# Patient Record
Sex: Male | Born: 1950
Health system: Southern US, Community
[De-identification: ages and names within clinical notes are randomized; demographics above are authoritative.]

## PROBLEM LIST (undated history)

## (undated) DIAGNOSIS — Z9861 Coronary angioplasty status: Principal | ICD-10-CM

## (undated) DIAGNOSIS — I2510 Atherosclerotic heart disease of native coronary artery without angina pectoris: Secondary | ICD-10-CM

## (undated) DIAGNOSIS — E119 Type 2 diabetes mellitus without complications: Secondary | ICD-10-CM

## (undated) DIAGNOSIS — M1990 Unspecified osteoarthritis, unspecified site: Secondary | ICD-10-CM

## (undated) DIAGNOSIS — S14129A Central cord syndrome at unspecified level of cervical spinal cord, initial encounter: Secondary | ICD-10-CM

## (undated) DIAGNOSIS — E7800 Pure hypercholesterolemia, unspecified: Secondary | ICD-10-CM

## (undated) DIAGNOSIS — I5042 Chronic combined systolic (congestive) and diastolic (congestive) heart failure: Secondary | ICD-10-CM

## (undated) DIAGNOSIS — I2699 Other pulmonary embolism without acute cor pulmonale: Secondary | ICD-10-CM

## (undated) DIAGNOSIS — E669 Obesity, unspecified: Secondary | ICD-10-CM

## (undated) DIAGNOSIS — I10 Essential (primary) hypertension: Secondary | ICD-10-CM

## (undated) DIAGNOSIS — I214 Non-ST elevation (NSTEMI) myocardial infarction: Secondary | ICD-10-CM

## (undated) HISTORY — DX: Obesity, unspecified: E66.9

## (undated) HISTORY — DX: Coronary angioplasty status: Z98.61

## (undated) HISTORY — DX: Other pulmonary embolism without acute cor pulmonale: I26.99

## (undated) HISTORY — DX: Atherosclerotic heart disease of native coronary artery without angina pectoris: I25.10

## (undated) HISTORY — DX: Unspecified osteoarthritis, unspecified site: M19.90

## (undated) HISTORY — DX: Essential (primary) hypertension: I10

## (undated) HISTORY — DX: Chronic combined systolic (congestive) and diastolic (congestive) heart failure: I50.42

## (undated) HISTORY — PX: CYSTOSCOPY/RETROGRADE/URETEROSCOPY/STONE EXTRACTION WITH BASKET: SHX5317

## (undated) HISTORY — PX: MENISCUS REPAIR: SHX5179

## (undated) NOTE — *Deleted (*Deleted)
Health Maintenance Due  Topic Date Due  . COVID-19 Vaccine (1) Never done  . INFLUENZA VACCINE  03/25/2020  Please stop by lab before you go If you have mychart- we will send your results within 3 business days of [place] receiving them.  If you do not have mychart- we will call you about results within 5 business days of [place] receiving them.  *please note we are currently using [facility] which has a longer processing time than [place] typically so labs may not come back as quickly as in the past *please also note that you will see labs on mychart as soon as they post. I will later go in and write notes on them- will say "notes from Dr. Durene Cal"  Depression screen [facility] 2/9 01/06/2020 12/22/2019 01/13/2019  Decreased Interest 0 0 0  Down, Depressed, Hopeless 0 0 0  PHQ - 2 Score 0 0 0  Some recent data might be hidden

---

## 1998-10-17 ENCOUNTER — Encounter: Payer: Self-pay | Admitting: Urology

## 1998-10-17 ENCOUNTER — Ambulatory Visit (HOSPITAL_COMMUNITY): Admission: RE | Admit: 1998-10-17 | Discharge: 1998-10-17 | Payer: Self-pay | Admitting: Urology

## 1998-12-10 ENCOUNTER — Ambulatory Visit (HOSPITAL_COMMUNITY): Admission: RE | Admit: 1998-12-10 | Discharge: 1998-12-10 | Payer: Self-pay | Admitting: Urology

## 1998-12-10 ENCOUNTER — Encounter: Payer: Self-pay | Admitting: Urology

## 2000-04-06 ENCOUNTER — Ambulatory Visit (HOSPITAL_COMMUNITY): Admission: RE | Admit: 2000-04-06 | Discharge: 2000-04-06 | Payer: Self-pay | Admitting: Internal Medicine

## 2000-04-07 ENCOUNTER — Encounter: Payer: Self-pay | Admitting: Internal Medicine

## 2002-08-25 HISTORY — PX: TRANSTHORACIC ECHOCARDIOGRAM: SHX275

## 2003-06-22 ENCOUNTER — Emergency Department (HOSPITAL_COMMUNITY): Admission: EM | Admit: 2003-06-22 | Discharge: 2003-06-22 | Payer: Self-pay

## 2003-06-27 ENCOUNTER — Encounter (HOSPITAL_COMMUNITY): Admission: RE | Admit: 2003-06-27 | Discharge: 2003-09-25 | Payer: Self-pay | Admitting: *Deleted

## 2004-10-17 ENCOUNTER — Ambulatory Visit: Payer: Self-pay | Admitting: Internal Medicine

## 2004-11-29 ENCOUNTER — Ambulatory Visit: Payer: Self-pay | Admitting: Internal Medicine

## 2005-03-03 ENCOUNTER — Ambulatory Visit: Payer: Self-pay | Admitting: Internal Medicine

## 2005-03-18 ENCOUNTER — Ambulatory Visit: Payer: Self-pay | Admitting: Internal Medicine

## 2005-03-28 ENCOUNTER — Ambulatory Visit: Payer: Self-pay | Admitting: Internal Medicine

## 2005-04-21 ENCOUNTER — Ambulatory Visit: Payer: Self-pay | Admitting: Internal Medicine

## 2005-06-23 ENCOUNTER — Ambulatory Visit: Payer: Self-pay | Admitting: Internal Medicine

## 2005-07-25 ENCOUNTER — Ambulatory Visit: Payer: Self-pay | Admitting: Family Medicine

## 2005-09-30 ENCOUNTER — Ambulatory Visit: Payer: Self-pay | Admitting: Internal Medicine

## 2006-01-06 ENCOUNTER — Ambulatory Visit: Payer: Self-pay | Admitting: Internal Medicine

## 2006-03-16 ENCOUNTER — Ambulatory Visit: Payer: Self-pay | Admitting: Internal Medicine

## 2006-03-19 ENCOUNTER — Ambulatory Visit: Payer: Self-pay | Admitting: Internal Medicine

## 2006-05-19 ENCOUNTER — Ambulatory Visit: Payer: Self-pay | Admitting: Internal Medicine

## 2006-05-23 ENCOUNTER — Ambulatory Visit: Payer: Self-pay | Admitting: Family Medicine

## 2006-05-27 ENCOUNTER — Ambulatory Visit (HOSPITAL_COMMUNITY): Admission: RE | Admit: 2006-05-27 | Discharge: 2006-05-27 | Payer: Self-pay | Admitting: Urology

## 2006-06-25 ENCOUNTER — Ambulatory Visit: Payer: Self-pay | Admitting: Internal Medicine

## 2006-07-23 ENCOUNTER — Ambulatory Visit (HOSPITAL_BASED_OUTPATIENT_CLINIC_OR_DEPARTMENT_OTHER): Admission: RE | Admit: 2006-07-23 | Discharge: 2006-07-23 | Payer: Self-pay | Admitting: Urology

## 2006-08-27 ENCOUNTER — Ambulatory Visit: Payer: Self-pay | Admitting: Internal Medicine

## 2006-12-01 ENCOUNTER — Ambulatory Visit: Payer: Self-pay | Admitting: Internal Medicine

## 2007-02-09 ENCOUNTER — Ambulatory Visit: Payer: Self-pay | Admitting: Internal Medicine

## 2007-02-19 ENCOUNTER — Encounter: Payer: Self-pay | Admitting: Internal Medicine

## 2007-03-15 DIAGNOSIS — I10 Essential (primary) hypertension: Secondary | ICD-10-CM | POA: Insufficient documentation

## 2007-03-15 DIAGNOSIS — E1159 Type 2 diabetes mellitus with other circulatory complications: Secondary | ICD-10-CM | POA: Insufficient documentation

## 2007-03-15 DIAGNOSIS — I152 Hypertension secondary to endocrine disorders: Secondary | ICD-10-CM | POA: Insufficient documentation

## 2007-03-15 DIAGNOSIS — E669 Obesity, unspecified: Secondary | ICD-10-CM | POA: Insufficient documentation

## 2007-03-15 DIAGNOSIS — M109 Gout, unspecified: Secondary | ICD-10-CM | POA: Insufficient documentation

## 2007-03-22 DIAGNOSIS — M1990 Unspecified osteoarthritis, unspecified site: Secondary | ICD-10-CM | POA: Insufficient documentation

## 2007-03-23 ENCOUNTER — Ambulatory Visit: Payer: Self-pay | Admitting: Internal Medicine

## 2007-03-23 LAB — CONVERTED CEMR LAB
ALT: 25 U/L
AST: 22 U/L
Albumin: 3.3 g/dL — ABNORMAL LOW
Alkaline Phosphatase: 70 U/L
BUN: 25 mg/dL — ABNORMAL HIGH
Basophils Absolute: 0 10*3/uL
Basophils Relative: 0.2 %
Bilirubin Urine: NEGATIVE
Bilirubin, Direct: 0.1 mg/dL
CO2: 27 meq/L
Calcium: 9 mg/dL
Chloride: 106 meq/L
Cholesterol: 138 mg/dL
Creatinine, Ser: 1.2 mg/dL
Eosinophils Absolute: 0.1 10*3/uL
Eosinophils Relative: 1.5 %
GFR calc Af Amer: 80 mL/min
GFR calc non Af Amer: 66 mL/min
Glucose, Bld: 97 mg/dL
Glucose, Urine, Semiquant: NEGATIVE
HCT: 40 %
HDL: 33.5 mg/dL — ABNORMAL LOW
Hemoglobin: 13.9 g/dL
Ketones, urine, test strip: NEGATIVE
LDL Cholesterol: 91 mg/dL
Lymphocytes Relative: 32 %
MCHC: 34.7 g/dL
MCV: 87.6 fL
Monocytes Absolute: 0.7 10*3/uL
Monocytes Relative: 9.5 %
Neutro Abs: 4.4 10*3/uL
Neutrophils Relative %: 56.8 %
Nitrite: NEGATIVE
PSA: 0.44 ng/mL
Platelets: 224 10*3/uL
Potassium: 4.1 meq/L
Protein, U semiquant: NEGATIVE
RBC: 4.57 M/uL
RDW: 13.7 %
Sodium: 141 meq/L
Specific Gravity, Urine: 1.025
TSH: 1.86 u[IU]/mL
Total Bilirubin: 0.9 mg/dL
Total CHOL/HDL Ratio: 4.1
Total Protein: 6.8 g/dL
Triglycerides: 67 mg/dL
Urobilinogen, UA: 0.2
VLDL: 13 mg/dL
WBC Urine, dipstick: NEGATIVE
WBC: 7.7 10*3/uL
pH: 5.5

## 2007-03-25 ENCOUNTER — Ambulatory Visit: Payer: Self-pay | Admitting: Internal Medicine

## 2007-05-31 ENCOUNTER — Ambulatory Visit: Payer: Self-pay | Admitting: Internal Medicine

## 2007-05-31 DIAGNOSIS — E1142 Type 2 diabetes mellitus with diabetic polyneuropathy: Secondary | ICD-10-CM | POA: Insufficient documentation

## 2007-05-31 DIAGNOSIS — R109 Unspecified abdominal pain: Secondary | ICD-10-CM | POA: Insufficient documentation

## 2007-05-31 DIAGNOSIS — G608 Other hereditary and idiopathic neuropathies: Secondary | ICD-10-CM | POA: Insufficient documentation

## 2007-05-31 LAB — CONVERTED CEMR LAB
Folate: 10.6 ng/mL
Hgb A1c MFr Bld: 6.1 % — ABNORMAL HIGH (ref 4.6–6.0)
Vitamin B-12: 339 pg/mL (ref 211–911)

## 2007-06-24 ENCOUNTER — Encounter: Payer: Self-pay | Admitting: Internal Medicine

## 2007-07-15 ENCOUNTER — Ambulatory Visit: Payer: Self-pay | Admitting: Internal Medicine

## 2007-11-10 ENCOUNTER — Ambulatory Visit: Payer: Self-pay | Admitting: Internal Medicine

## 2007-11-10 DIAGNOSIS — M549 Dorsalgia, unspecified: Secondary | ICD-10-CM | POA: Insufficient documentation

## 2007-11-10 DIAGNOSIS — M545 Low back pain, unspecified: Secondary | ICD-10-CM | POA: Insufficient documentation

## 2008-11-30 ENCOUNTER — Encounter: Payer: Self-pay | Admitting: Internal Medicine

## 2008-11-30 ENCOUNTER — Ambulatory Visit: Payer: Self-pay

## 2008-11-30 ENCOUNTER — Telehealth: Payer: Self-pay | Admitting: Internal Medicine

## 2008-11-30 DIAGNOSIS — M79609 Pain in unspecified limb: Secondary | ICD-10-CM | POA: Insufficient documentation

## 2008-12-01 ENCOUNTER — Ambulatory Visit: Payer: Self-pay | Admitting: Internal Medicine

## 2008-12-01 LAB — CONVERTED CEMR LAB
ALT: 27 units/L (ref 0–53)
AST: 25 units/L (ref 0–37)
Albumin: 3.3 g/dL — ABNORMAL LOW (ref 3.5–5.2)
Alkaline Phosphatase: 82 units/L (ref 39–117)
BUN: 20 mg/dL (ref 6–23)
Basophils Absolute: 0 10*3/uL (ref 0.0–0.1)
Basophils Relative: 0.4 % (ref 0.0–3.0)
Bilirubin Urine: NEGATIVE
Bilirubin, Direct: 0.1 mg/dL (ref 0.0–0.3)
CO2: 30 meq/L (ref 19–32)
Calcium: 8.7 mg/dL (ref 8.4–10.5)
Chloride: 108 meq/L (ref 96–112)
Cholesterol: 136 mg/dL (ref 0–200)
Creatinine, Ser: 1.1 mg/dL (ref 0.4–1.5)
Eosinophils Absolute: 0.1 10*3/uL (ref 0.0–0.7)
Eosinophils Relative: 1.5 % (ref 0.0–5.0)
GFR calc non Af Amer: 87.98 mL/min (ref >60)
Glucose, Bld: 117 mg/dL — ABNORMAL HIGH (ref 70–99)
HCT: 42.9 % (ref 39.0–52.0)
HDL: 38.4 mg/dL — ABNORMAL LOW (ref >39.00)
Hemoglobin, Urine: NEGATIVE
Hemoglobin: 14.7 g/dL (ref 13.0–17.0)
Ketones, ur: NEGATIVE mg/dL
LDL Cholesterol: 88 mg/dL (ref 0–99)
Leukocytes, UA: NEGATIVE
Lymphocytes Relative: 25 % (ref 12.0–46.0)
Lymphs Abs: 1.9 10*3/uL (ref 0.7–4.0)
MCHC: 34.2 g/dL (ref 30.0–36.0)
MCV: 88.4 fL (ref 78.0–100.0)
Monocytes Absolute: 0.6 10*3/uL (ref 0.1–1.0)
Monocytes Relative: 8.2 % (ref 3.0–12.0)
Neutro Abs: 5 10*3/uL (ref 1.4–7.7)
Neutrophils Relative %: 64.9 % (ref 43.0–77.0)
Nitrite: NEGATIVE
PSA: 0.46 ng/mL (ref 0.10–4.00)
Platelets: 204 10*3/uL (ref 150.0–400.0)
Potassium: 4.4 meq/L (ref 3.5–5.1)
RBC: 4.85 M/uL (ref 4.22–5.81)
RDW: 13.3 % (ref 11.5–14.6)
Sodium: 144 meq/L (ref 135–145)
Specific Gravity, Urine: 1.025 (ref 1.000–1.030)
TSH: 0.98 microintl units/mL (ref 0.35–5.50)
Total Bilirubin: 0.7 mg/dL (ref 0.3–1.2)
Total CHOL/HDL Ratio: 4
Total Protein, Urine: NEGATIVE mg/dL
Total Protein: 7.1 g/dL (ref 6.0–8.3)
Triglycerides: 47 mg/dL (ref 0.0–149.0)
Urine Glucose: NEGATIVE mg/dL
Urobilinogen, UA: 0.2 (ref 0.0–1.0)
VLDL: 9.4 mg/dL (ref 0.0–40.0)
WBC: 7.6 10*3/uL (ref 4.5–10.5)
pH: 5.5 (ref 5.0–8.0)

## 2008-12-08 ENCOUNTER — Ambulatory Visit: Payer: Self-pay | Admitting: Internal Medicine

## 2008-12-08 DIAGNOSIS — R51 Headache: Secondary | ICD-10-CM | POA: Insufficient documentation

## 2008-12-08 DIAGNOSIS — R600 Localized edema: Secondary | ICD-10-CM | POA: Insufficient documentation

## 2008-12-08 DIAGNOSIS — R519 Headache, unspecified: Secondary | ICD-10-CM | POA: Insufficient documentation

## 2008-12-08 DIAGNOSIS — R609 Edema, unspecified: Secondary | ICD-10-CM | POA: Insufficient documentation

## 2009-08-21 ENCOUNTER — Ambulatory Visit: Payer: Self-pay | Admitting: Internal Medicine

## 2009-08-21 DIAGNOSIS — M719 Bursopathy, unspecified: Secondary | ICD-10-CM

## 2009-08-21 DIAGNOSIS — M67919 Unspecified disorder of synovium and tendon, unspecified shoulder: Secondary | ICD-10-CM | POA: Insufficient documentation

## 2009-08-22 ENCOUNTER — Encounter: Payer: Self-pay | Admitting: Internal Medicine

## 2010-06-28 ENCOUNTER — Encounter: Payer: Self-pay | Admitting: Internal Medicine

## 2010-06-28 ENCOUNTER — Telehealth: Payer: Self-pay | Admitting: Internal Medicine

## 2010-07-09 ENCOUNTER — Telehealth: Payer: Self-pay | Admitting: Internal Medicine

## 2010-07-10 ENCOUNTER — Ambulatory Visit: Payer: Self-pay | Admitting: Internal Medicine

## 2010-07-10 LAB — CONVERTED CEMR LAB
ALT: 21 units/L (ref 0–53)
AST: 23 units/L (ref 0–37)
Albumin: 3.4 g/dL — ABNORMAL LOW (ref 3.5–5.2)
Alkaline Phosphatase: 83 units/L (ref 39–117)
BUN: 24 mg/dL — ABNORMAL HIGH (ref 6–23)
Basophils Absolute: 0 10*3/uL (ref 0.0–0.1)
Basophils Relative: 0.5 % (ref 0.0–3.0)
Bilirubin Urine: NEGATIVE
Bilirubin, Direct: 0 mg/dL (ref 0.0–0.3)
CO2: 27 meq/L (ref 19–32)
Calcium: 8.9 mg/dL (ref 8.4–10.5)
Chloride: 106 meq/L (ref 96–112)
Cholesterol: 164 mg/dL (ref 0–200)
Creatinine, Ser: 1.3 mg/dL (ref 0.4–1.5)
Eosinophils Absolute: 0.1 10*3/uL (ref 0.0–0.7)
Eosinophils Relative: 1.5 % (ref 0.0–5.0)
GFR calc non Af Amer: 73.47 mL/min (ref >60)
Glucose, Bld: 120 mg/dL — ABNORMAL HIGH (ref 70–99)
Glucose, Urine, Semiquant: NEGATIVE
HCT: 43.6 % (ref 39.0–52.0)
HDL: 39.7 mg/dL (ref >39.00)
Hemoglobin: 14.7 g/dL (ref 13.0–17.0)
Ketones, urine, test strip: NEGATIVE
LDL Cholesterol: 116 mg/dL — ABNORMAL HIGH (ref 0–99)
Lymphocytes Relative: 29.4 % (ref 12.0–46.0)
Lymphs Abs: 2.3 10*3/uL (ref 0.7–4.0)
MCHC: 33.7 g/dL (ref 30.0–36.0)
MCV: 90.8 fL (ref 78.0–100.0)
Monocytes Absolute: 0.7 10*3/uL (ref 0.1–1.0)
Monocytes Relative: 8.8 % (ref 3.0–12.0)
Neutro Abs: 4.7 10*3/uL (ref 1.4–7.7)
Neutrophils Relative %: 59.8 % (ref 43.0–77.0)
Nitrite: NEGATIVE
PSA: 0.47 ng/mL (ref 0.10–4.00)
Platelets: 223 10*3/uL (ref 150.0–400.0)
Potassium: 4.3 meq/L (ref 3.5–5.1)
Protein, U semiquant: NEGATIVE
RBC: 4.8 M/uL (ref 4.22–5.81)
RDW: 13.9 % (ref 11.5–14.6)
Sodium: 141 meq/L (ref 135–145)
Specific Gravity, Urine: 1.025
TSH: 1.5 microintl units/mL (ref 0.35–5.50)
Total Bilirubin: 0.5 mg/dL (ref 0.3–1.2)
Total CHOL/HDL Ratio: 4
Total Protein: 6.8 g/dL (ref 6.0–8.3)
Triglycerides: 44 mg/dL (ref 0.0–149.0)
Urobilinogen, UA: 0.2
VLDL: 8.8 mg/dL (ref 0.0–40.0)
WBC Urine, dipstick: NEGATIVE
WBC: 7.9 10*3/uL (ref 4.5–10.5)
pH: 6

## 2010-07-17 ENCOUNTER — Ambulatory Visit: Payer: Self-pay | Admitting: Internal Medicine

## 2010-07-17 DIAGNOSIS — I87329 Chronic venous hypertension (idiopathic) with inflammation of unspecified lower extremity: Secondary | ICD-10-CM | POA: Insufficient documentation

## 2010-07-17 DIAGNOSIS — N138 Other obstructive and reflux uropathy: Secondary | ICD-10-CM | POA: Insufficient documentation

## 2010-07-17 DIAGNOSIS — N401 Enlarged prostate with lower urinary tract symptoms: Secondary | ICD-10-CM

## 2010-07-17 DIAGNOSIS — E119 Type 2 diabetes mellitus without complications: Secondary | ICD-10-CM | POA: Insufficient documentation

## 2010-07-17 LAB — CONVERTED CEMR LAB: Hgb A1c MFr Bld: 6.4 % — ABNORMAL HIGH (ref <5.7)

## 2010-09-14 ENCOUNTER — Encounter: Payer: Self-pay | Admitting: *Deleted

## 2010-09-26 NOTE — Letter (Signed)
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  His handicap sticker is for walking long distances.        Sincerely,   Dr. Darryll Capers

## 2010-09-26 NOTE — Assessment & Plan Note (Signed)
Summary: cpx/njr   Vital Signs:  Patient profile:   60 year old male Height:      72 inches Weight:      350 pounds BMI:     47.64 Temp:     98.2 degrees F oral Pulse rate:   76 / minute Resp:     16 per minute BP sitting:   130 / 84  (left arm) Cuff size:   large  Vitals Entered By: Willy Eddy, LPN (July 17, 2010 2:12 PM) CC: cpx- weight is 350 +--no colonoscopy, Hypertension Management Is Patient Diabetic? No   Primary Care Provider:  Stacie Glaze MD  CC:  cpx- weight is 350 +--no colonoscopy and Hypertension Management.  History of Present Illness: urge incontinance, nocturia hx of BPH morbid obesity without weight loss and resultnat severe degenerative joint dz of knees ( he refuses TKR) HTN controlled, no chest pain or increased SOB hemorroids with BRBPR with wiping   The pt was asked about all immunizations, health maint. services that are appropriate to their age and was given guidance on diet exercize  and weight management    Hypertension History:      He denies headache, chest pain, palpitations, dyspnea with exertion, orthopnea, PND, peripheral edema, visual symptoms, neurologic problems, syncope, and side effects from treatment.        Positive major cardiovascular risk factors include male age 64 years old or older and hypertension.  Negative major cardiovascular risk factors include non-tobacco-user status.     Preventive Screening-Counseling & Management  Alcohol-Tobacco     Smoking Status: quit     Tobacco Counseling: not indicated; no tobacco use  Problems Prior to Update: 1)  Chronic Venous Hypertension With Inflammation  (ICD-459.32) 2)  Hyperglycemia, Fasting  (ICD-790.29) 3)  Rotator Cuff Syndrome, Right  (ICD-726.10) 4)  Headache  (ICD-784.0) 5)  Edema  (ICD-782.3) 6)  Leg Pain, Left  (ICD-729.5) 7)  Back Pain, Left  (ICD-724.5) 8)  Symptom, Pain, Abdominal, Unspecified Site  (ICD-789.00) 9)  Neuropathy, Idiopathic Peripheral  Nec  (ICD-356.8) 10)  Health Maintenance Exam  (ICD-V70.0) 11)  Osteoarthrosis, Local Nos, Lower Leg  (ICD-715.36) 12)  Benign Prostatic Hypertrophy, With Urinary Obstruction  (ICD-600.01) 13)  Obesity  (ICD-278.00) 14)  Hypertension  (ICD-401.9) 15)  Gout  (ICD-274.9)  Current Problems (verified): 1)  Rotator Cuff Syndrome, Right  (ICD-726.10) 2)  Headache  (ICD-784.0) 3)  Edema  (ICD-782.3) 4)  Leg Pain, Left  (ICD-729.5) 5)  Back Pain, Left  (ICD-724.5) 6)  Symptom, Pain, Abdominal, Unspecified Site  (ICD-789.00) 7)  Neuropathy, Idiopathic Peripheral Nec  (ICD-356.8) 8)  Health Maintenance Exam  (ICD-V70.0) 9)  Osteoarthrosis, Local Nos, Lower Leg  (ICD-715.36) 10)  Osteoarthritis  (ICD-715.90) 11)  Obesity  (ICD-278.00) 12)  Hypertension  (ICD-401.9) 13)  Gout  (ICD-274.9)  Medications Prior to Update: 1)  Lipitor 40 Mg Tabs (Atorvastatin Calcium) .... Once Daily 2)  Verapamil Hcl Cr 240 Mg Tbcr (Verapamil Hcl) .... Once Daily 3)  Accupril 40 Mg  Tabs (Quinapril Hcl) .... Once Daily 4)  Adult Aspirin Low Strength 81 Mg  Tbdp (Aspirin) .... Once Daily 5)  Saw Palmetto 1000 Mg  Caps (Saw Palmetto (Serenoa Repens)) .... Once Daily 6)  Flector 1.3 %  Ptch (Diclofenac Epolamine) .... Apply Bid 7)  Skelaxin 800 Mg  Tabs (Metaxalone) .... One By Mouth Qid 8)  Ibuprofen 800 Mg Tabs (Ibuprofen) .... One By Mouth Three Times A Day  Current Medications (verified):  1)  Lipitor 40 Mg Tabs (Atorvastatin Calcium) .... Once Daily 2)  Verapamil Hcl Cr 240 Mg Tbcr (Verapamil Hcl) .... Once Daily 3)  Accupril 40 Mg  Tabs (Quinapril Hcl) .... Once Daily 4)  Adult Aspirin Low Strength 81 Mg  Tbdp (Aspirin) .... Once Daily 5)  Saw Palmetto 1000 Mg  Caps (Saw Palmetto (Serenoa Repens)) .... Once Daily 6)  Skelaxin 800 Mg  Tabs (Metaxalone) .... One By Mouth Qid As Needed 7)  Ibuprofen 800 Mg Tabs (Ibuprofen) .... One By Mouth Three Times A Day As Needed 8)  Tamsulosin Hcl 0.4 Mg Caps  (Tamsulosin Hcl) .... One By Mouth Dailly  Allergies (verified): No Known Drug Allergies  Past History:  Family History: Last updated: 03/25/2007 father unknown mother liver diease  Social History: Last updated: 03/25/2007 Occupation: Runner, broadcasting/film/video Married Former Smoker  Risk Factors: Smoking Status: quit (07/17/2010)  Past medical, surgical, family and social histories (including risk factors) reviewed, and no changes noted (except as noted below).  Past Medical History: Reviewed history from 03/25/2007 and no changes required. Gout Hypertension DJD Obesity Osteoarthritis echo 2004 no LVH nl ejection fraction  Past Surgical History: Reviewed history from 03/25/2007 and no changes required. ureteral stone 05/2006, 1999 left knee open meniscetomy  Family History: Reviewed history from 03/25/2007 and no changes required. father unknown mother liver diease  Social History: Reviewed history from 03/25/2007 and no changes required. Occupation: Runner, broadcasting/film/video Married Former Smoker  Review of Systems  The patient denies anorexia, fever, weight loss, weight gain, vision loss, decreased hearing, hoarseness, chest pain, syncope, dyspnea on exertion, peripheral edema, prolonged cough, headaches, hemoptysis, abdominal pain, melena, hematochezia, severe indigestion/heartburn, hematuria, incontinence, genital sores, muscle weakness, suspicious skin lesions, transient blindness, difficulty walking, depression, unusual weight change, abnormal bleeding, enlarged lymph nodes, angioedema, breast masses, and testicular masses.         Flu Vaccine Consent Questions     Do you have a history of severe allergic reactions to this vaccine? no    Any prior history of allergic reactions to egg and/or gelatin? no    Do you have a sensitivity to the preservative Thimersol? no    Do you have a past history of Guillan-Barre Syndrome? no    Do you currently have an acute febrile illness? no    Have you  ever had a severe reaction to latex? no    Vaccine information given and explained to patient? yes    Are you currently pregnant? no    Lot Number:[id number]   Exp Date:02/22/2011   Site Given  Left Deltoid IM   Physical Exam  General:  morbid obesisty Head:  normocephalic, atraumatic, no abnormalities observed, and no abnormalities palpated.   Eyes:  pupils equal, pupils round, and pupils reactive to light.   Ears:  R ear normal and L ear normal.   Nose:  no nasal discharge.   Mouth:  pharynx pink and moist.   Neck:  thick neck Chest Wall:  No deformities, masses, tenderness or gynecomastia noted. Lungs:  normal respiratory effort and no wheezes.   Heart:  normal rate and regular rhythm.   Abdomen:  no guarding, no rigidity, no rebound tenderness, and distended.   Rectal:  no masses and external hemorrhoid(s).   Prostate:  no asymmetry and boggy.   Msk:  joint tenderness and joint swelling.     Knee Exam  Gait:    limp noted-right and using crutch-left.    Inspection:    deformity:  Palpation:    tenderness R-Parapatellar and tenderness L-Parapatellar.     Impression & Recommendations:  Problem # 1:  HYPERGLYCEMIA, FASTING (ICD-790.29)  Labs Reviewed: Creat: 1.3 (07/10/2010)     Orders: Venipuncture (95188) TLB-A1C / Hgb A1C (Glycohemoglobin) (83036-A1C) Specimen Handling (41660)  Problem # 2:  CHRONIC VENOUS HYPERTENSION WITH INFLAMMATION (ICD-459.32)  Orders: Durable Medical Equipment (DME)  Problem # 3:  BACK PAIN, LEFT (ICD-724.5)  His updated medication list for this problem includes:    Adult Aspirin Low Strength 81 Mg Tbdp (Aspirin) ..... Once daily    Skelaxin 800 Mg Tabs (Metaxalone) ..... One by mouth qid as needed    Ibuprofen 800 Mg Tabs (Ibuprofen) ..... One by mouth three times a day as needed  Discussed use of moist heat or ice, modified activities, medications, and stretching/strengthening exercises. Back care instructions given. To be  seen in 2 weeks if no improvement; sooner if worsening of symptoms.   Problem # 4:  HEALTH MAINTENANCE EXAM (ICD-V70.0) The pt was asked about all immunizations, health maint. services that are appropriate to their age and was given guidance on diet exercize  and weight management  Td Booster: Tdap (12/08/2008)   Flu Vax: Fluvax 3+ (05/31/2007)   Chol: 164 (07/10/2010)   HDL: 39.70 (07/10/2010)   LDL: 116 (07/10/2010)   TG: 44.0 (07/10/2010) TSH: 1.50 (07/10/2010)   HgbA1C: 6.1 (05/31/2007)   PSA: 0.47 (07/10/2010)  Discussed using sunscreen, use of alcohol, drug use, self testicular exam, routine dental care, routine eye care, routine physical exam, seat belts, multiple vitamins, osteoporosis prevention, adequate calcium intake in diet, and recommendations for immunizations.  Discussed exercise and checking cholesterol.  Discussed gun safety, safe sex, and contraception. Also recommend checking PSA.  Problem # 5:  BENIGN PROSTATIC HYPERTROPHY, WITH URINARY OBSTRUCTION (ICD-600.01) Assessment: Unchanged  His updated medication list for this problem includes:    Tamsulosin Hcl 0.4 Mg Caps (Tamsulosin hcl) ..... One by mouth dailly  PSA: 0.47 (07/10/2010)     Problem # 6:  OSTEOARTHROSIS, LOCAL NOS, LOWER LEG (ICD-715.36) Assessment: Deteriorated bilateral severe knee joint dz His updated medication list for this problem includes:    Adult Aspirin Low Strength 81 Mg Tbdp (Aspirin) ..... Once daily    Ibuprofen 800 Mg Tabs (Ibuprofen) ..... One by mouth three times a day as needed  Discussed use of medications, application of heat or cold, and exercises.   Complete Medication List: 1)  Lipitor 40 Mg Tabs (Atorvastatin calcium) .... Once daily 2)  Verapamil Hcl Cr 240 Mg Tbcr (Verapamil hcl) .... Once daily 3)  Accupril 40 Mg Tabs (Quinapril hcl) .... Once daily 4)  Adult Aspirin Low Strength 81 Mg Tbdp (Aspirin) .... Once daily 5)  Saw Palmetto 1000 Mg Caps (Saw palmetto (serenoa  repens)) .... Once daily 6)  Skelaxin 800 Mg Tabs (Metaxalone) .... One by mouth qid as needed 7)  Ibuprofen 800 Mg Tabs (Ibuprofen) .... One by mouth three times a day as needed 8)  Tamsulosin Hcl 0.4 Mg Caps (Tamsulosin hcl) .... One by mouth dailly  Other Orders: Admin 1st Vaccine (63016) Flu Vaccine 44yrs + (01093)  Hypertension Assessment/Plan:      The patient's hypertensive risk group is category B: At least one risk factor (excluding diabetes) with no target organ damage.  His calculated 10 year risk of coronary heart disease is 18 %.  Today's blood pressure is 130/84.  His blood pressure goal is < 140/90.  Patient Instructions: 1)  Please schedule a  follow-up appointment in 1 month.  Prescriptions: TAMSULOSIN HCL 0.4 MG CAPS (TAMSULOSIN HCL) one by mouth dailly  #60 x 3   Entered and Authorized by:   Stacie Glaze MD   Signed by:   Stacie Glaze MD on 07/17/2010   Method used:   Electronically to        [facility] [address] [phone]* (retail)       [address]       [place], [place]  [id number]       Ph: [phone] or [phone]       Fax: [phone]   RxID:   [id number]    Orders Added: 1)  Venipuncture [36415] 2)  TLB-A1C / Hgb A1C (Glycohemoglobin) [83036-A1C] 3)  Est. Patient 40-64 years [99396] 4)  Est. Patient Level III [40347] 5)  Durable Medical Equipment [DME] 6)  Admin 1st Vaccine [90471] 7)  Flu Vaccine 48yrs + [42595] 8)  Specimen Handling [99000]

## 2010-09-26 NOTE — Progress Notes (Signed)
Summary: REQ FOR LETTER?  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 Pt adv the only limitations he has is that he is unable to walk long distances?   Pt can be reached at [phone] when ready. Initial call taken by: Debbra Riding,  June 28, 2010 2:27 PM  Follow-up for Phone Call        ok oper dr Lovell Sheehan as long as he has a cdl and I talked with pt and he is does Follow-up by: Willy Eddy, LPN,  June 28, 2010 3:33 PM

## 2010-09-26 NOTE — Letter (Signed)
Summary: [facility]  [facility]   Imported By: Maryln Gottron 08/27/2009 15:24:58  _____________________________________________________________________  External Attachment:    Type:   Image     Comment:   External Document

## 2010-09-26 NOTE — Progress Notes (Signed)
Summary: requesting letter  Phone Note Call from Patient Call back at Home Phone [phone] Call back at [phone]   Caller: Patient---live call Reason for Call: Talk to Doctor Summary of Call: requesting a letter stating the length of time that he has been walking with a cane. 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 can pickup tomorrow. please return call. Initial call taken by: Warnell Forester,  July 09, 2010 9:49 AM  Follow-up for Phone Call        I have no idea how long he has been walking with a cane- please tell us and he can pick up letter ttomorrow Follow-up by: Willy Eddy, LPN,  July 09, 2010 9:54 AM  Additional Follow-up for Phone Call Additional follow up Details #1::        ? 2004 Additional Follow-up by: [facility] CMA AAMA,  July 09, 2010 10:49 AM    Additional Follow-up for Phone Call Additional follow up Details #2::    done  Follow-up by: Willy Eddy, LPN,  July 09, 2010 10:57 AM

## 2011-01-10 NOTE — Op Note (Signed)
NAME:  Franklin Hicks, Franklin Hicks                   ACCOUNT NO.:  [id number]   MEDICAL RECORD NO.:  [id number]          PATIENT TYPE:  AMB   LOCATION:  DAY                          FACILITY:  [facility]   PHYSICIAN:  Bertram Millard. Dahlstedt, M.D.DATE OF BIRTH:  [birth year]   DATE OF PROCEDURE:  05/27/2006  DATE OF DISCHARGE:                                 OPERATIVE REPORT   PREOPERATIVE DIAGNOSIS:  Moderate size left upper ureteral stone.   POSTOPERATIVE DIAGNOSIS:  Moderate size left upper ureteral stone.   PRINCIPAL PROCEDURE:  Cystoscopy, left retrograde ureteral pyelogram, left  ureteroscopy (rigid and flexible), holmium laser of the left ureteral/renal  calculus, double-J stent placement.   SURGEON:  Bertram Millard. Dahlstedt, M.D.   ANESTHESIA:  General endotracheal.   COMPLICATIONS:  None.   BRIEF HISTORY:  60 year old obese male with a history of uric acid calculi.  He recently presented with significant left flank pain of several days  duration.  He was found to have a moderate sized left upper ureteral stone.  Due to the size approaching 1 cm, and the patient's pain, it was recommended  that he have more expeditious treatment.  He certainly desires this.  He is  aware of the risks and complications of stone treatment.  He is not a  candidate for extracorporeal shock wave lithotripsy due to the radiolucent  nature the stone.   DESCRIPTION OF PROCEDURE:  The patient was identified in the holding area  and taken to the operating room where general endotracheal anesthetic was  administered.  He was placed in the dorsal lithotomy position.  His  genitalia and perineum were prepped and draped.  A 22-French panendoscope  was advanced into his bladder.  The prostate was not obstructed.  The  bladder was normal.  The left ureteral orifice was cannulated with the 6  open-ended catheter and a retrograde performed.   The retrograde revealed a normal column of the ureter up to a filling  defect.  There was  significant obstruction and no contrast was allowed past  the stone.  The distal ureter was totally normal without filling defects.   A guidewire was advanced past the stone with minimal difficulty.  A good  curl seen within the renal pelvis.  The open-ended catheter was removed.  A  rigid ureteroscope was then passed easily up to the stone.  Due to the  pressure of the irrigating fluid, the stone was rinsed into the renal  pelvis.  I remove the rigid scope and a placed a 35 cm ureteral access  sheath.  The flexible ureteroscope was advanced up to the stone.  It was  grasped and then brought down into the ureter.  I was unable to bring it  past the mid ureter due to some ureteral narrowing of the reactive area  where the stone was.  The stone was then sent back up into the kidney.  I  then used the holmium laser to break the stone up into several smaller  fragments.  I was unable to find these fragments as they rinsed up into  the  caliceal system.  It was felt that they were small enough to be dissolved  with chemolysis.  Additionally, it was felt that a stent would help soften  up the stricture and allow the stones to pass.  At this point, the  ureteroscope was removed.  The ureteral access sheath was removed and a 24  cm x 5-French Polaris stent was placed using cystoscopic and fluoroscopic  guidance.  Good positioning was seen.   The bladder was drained and the procedure terminated.  The patient tolerated  the procedure well.  He was awakened and taken to the PACU in stable  condition.      Bertram Millard. Dahlstedt, M.D.  Electronically Signed     SMD/MEDQ  D:  05/27/2006  T:  05/28/2006  Job:  [id number]   cc:   Stacie Glaze, MD  [address] [place]  [place] [address]

## 2011-01-10 NOTE — Op Note (Signed)
NAME:  Franklin Hicks, Franklin Hicks                   ACCOUNT NO.:  [id number]   MEDICAL RECORD NO.:  [id number]          PATIENT TYPE:  AMB   LOCATION:  [facility]                         [facility]ACILITY:  [facility]   PHYSICIAN:  Bertram Millard. Dahlstedt, M.D.DATE OF BIRTH:  06-20-1951   DATE OF PROCEDURE:  07/23/2006  DATE OF DISCHARGE:                               OPERATIVE REPORT   PREOPERATIVE DIAGNOSIS:  Persistent left ureteral stone with pain,  nausea.   POSTOPERATIVE DIAGNOSIS:  Persistent left ureteral stone with pain,  nausea, with left ureteral stricture   PRINCIPAL PROCEDURE:  Cystoscopy, left retrograde ureteropyelogram, left  ureteroscopy with holmium laser and extraction of moderate to large left  ureteral stone, dilation of left ureteral stricture, double-J stent  placement.   SURGEON:  Bertram Millard. Dahlstedt, M.D.   ANESTHESIA:  General with LMA.   COMPLICATIONS:  None.   BRIEF HISTORY:  This is a very pleasant 60 year old gentleman with uric  acid stones.  He presented quite a few weeks ago with a moderate-sized  proximal left ureteral stone.  I had performed ureteroscopy.  The stone  rinsed up into the kidney and I broke it into several fragments.  The  largest fragment is still 7 mm in size.  It has not passed.  He is not  able to tolerate oral medications for chemolysis of the stone.   As he has a persistent stone in his ureter and significant symptoms, he  presents at this time for re-ureteroscopy, removal of this remaining  stone.  He is aware of risks and complications and desires to proceed.   DESCRIPTION OF PROCEDURE:  The patient was administered preoperative IV  antibiotics.  The side of surgery was marked; he was identified and  taken to the operating room, where a general anesthetic was  administered.  He was placed in the dorsal lithotomy position.  Genitalia and perineum were prepped and draped.  A 22-French  panendoscope was advanced into his bladder.  His urethra was normal.  Prostate nonobstructive.  Bladder normal.  Left ureteral orifice was  cannulated and a retrograde ureteropyelogram was performed.   This showed a filling defect in the mid ureter.  There was a normal  column of contrast in this area, except for the filling defect.  There  was a fairly significant stricture just proximal to the stone/filling  defect.  Contrast was able to get by the stricture, however.   A guidewire was placed through an open-ended catheter up into the left  kidney.  The cystoscope was removed.  A ureteroscope was then passed  directly through the urethra and the ureter.  The stone was encountered.  It was fragmented into several fragments, which were then snared with a  basket.  Several of these fragments were too large to be extracted.  They were fragmented again.  Approximately 6-7 fragments were obtained.  There were several dust-like fragments remaining.  It was felt that  these would rinsed out quite easily.   Once all the sizable fragmented had been extracted, the scope was  advanced up to the strictured area.  It was not seemingly dense, but I  thought it would be worthwhile to dilate the patient.   At this point, the ureteroscope was removed.  Over top of the guidewire,  a 10-cm 15-French balloon was passed.  The strictured area was dilated  to 15-French for approximately 2 minutes at an atmospheric pressure of  15.  The balloon was then removed.  Using a cystoscope, a 24 cm x 5-  [place] Polaris stent was then placed.  The bladder was drained.  The  scope was removed.  A string was left on the end of the stent and taped  to the penis.   The patient tolerated the procedure well.  He was awakened and taken to  the PACU in stable condition.  He has Percocet at home.  He is  discharged on Detrol LA 4 mg one p.o. daily, #10, and Cipro 500 mg one  p.o. b.i.d. for 5 days.  He will be followed up in approximately 1-2  weeks.      Bertram Millard. Dahlstedt, M.D.   Electronically Signed     SMD/MEDQ  D:  07/23/2006  T:  07/24/2006  Job:  [id number]

## 2011-07-21 ENCOUNTER — Other Ambulatory Visit: Payer: Self-pay | Admitting: Internal Medicine

## 2011-09-02 ENCOUNTER — Other Ambulatory Visit: Payer: Self-pay | Admitting: Internal Medicine

## 2011-09-08 ENCOUNTER — Other Ambulatory Visit (INDEPENDENT_AMBULATORY_CARE_PROVIDER_SITE_OTHER): Payer: Self-pay

## 2011-09-08 DIAGNOSIS — Z0000 Encounter for general adult medical examination without abnormal findings: Secondary | ICD-10-CM

## 2011-09-08 LAB — POCT URINALYSIS DIPSTICK
Bilirubin, UA: NEGATIVE
Glucose, UA: NEGATIVE
Ketones, UA: NEGATIVE
Leukocytes, UA: NEGATIVE
Nitrite, UA: NEGATIVE
Protein, UA: NEGATIVE
Spec Grav, UA: 1.025
Urobilinogen, UA: 0.2
pH, UA: 5.5

## 2011-09-08 LAB — HEPATIC FUNCTION PANEL
ALT: 19 U/L (ref 0–53)
AST: 19 U/L (ref 0–37)
Albumin: 3.3 g/dL — ABNORMAL LOW (ref 3.5–5.2)
Alkaline Phosphatase: 83 U/L (ref 39–117)
Bilirubin, Direct: 0.1 mg/dL (ref 0.0–0.3)
Total Bilirubin: 0.5 mg/dL (ref 0.3–1.2)
Total Protein: 6.5 g/dL (ref 6.0–8.3)

## 2011-09-08 LAB — CBC WITH DIFFERENTIAL/PLATELET
Basophils Absolute: 0 10*3/uL (ref 0.0–0.1)
Basophils Relative: 0.4 % (ref 0.0–3.0)
Eosinophils Absolute: 0.1 10*3/uL (ref 0.0–0.7)
Eosinophils Relative: 1.7 % (ref 0.0–5.0)
HCT: 41.9 % (ref 39.0–52.0)
Hemoglobin: 14.3 g/dL (ref 13.0–17.0)
Lymphocytes Relative: 33 % (ref 12.0–46.0)
Lymphs Abs: 2.7 10*3/uL (ref 0.7–4.0)
MCHC: 34 g/dL (ref 30.0–36.0)
MCV: 90.1 fl (ref 78.0–100.0)
Monocytes Absolute: 0.6 10*3/uL (ref 0.1–1.0)
Monocytes Relative: 7.5 % (ref 3.0–12.0)
Neutro Abs: 4.6 10*3/uL (ref 1.4–7.7)
Neutrophils Relative %: 57.4 % (ref 43.0–77.0)
Platelets: 228 10*3/uL (ref 150.0–400.0)
RBC: 4.66 Mil/uL (ref 4.22–5.81)
RDW: 14.8 % — ABNORMAL HIGH (ref 11.5–14.6)
WBC: 8.1 10*3/uL (ref 4.5–10.5)

## 2011-09-08 LAB — TSH: TSH: 2.13 u[IU]/mL (ref 0.35–5.50)

## 2011-09-08 LAB — BASIC METABOLIC PANEL
BUN: 21 mg/dL (ref 6–23)
CO2: 25 mEq/L (ref 19–32)
Calcium: 8.1 mg/dL — ABNORMAL LOW (ref 8.4–10.5)
Chloride: 103 mEq/L (ref 96–112)
Creatinine, Ser: 1.1 mg/dL (ref 0.4–1.5)
GFR: 83.66 mL/min (ref >60.00)
Glucose, Bld: 110 mg/dL — ABNORMAL HIGH (ref 70–99)
Potassium: 3.9 mEq/L (ref 3.5–5.1)
Sodium: 140 mEq/L (ref 135–145)

## 2011-09-08 LAB — PSA: PSA: 0.54 ng/mL (ref 0.10–4.00)

## 2011-09-08 LAB — LIPID PANEL
Cholesterol: 157 mg/dL (ref 0–200)
HDL: 42.2 mg/dL (ref >39.00)
LDL Cholesterol: 104 mg/dL — ABNORMAL HIGH (ref 0–99)
Total CHOL/HDL Ratio: 4
Triglycerides: 54 mg/dL (ref 0.0–149.0)
VLDL: 10.8 mg/dL (ref 0.0–40.0)

## 2011-09-15 ENCOUNTER — Encounter: Payer: Self-pay | Admitting: Internal Medicine

## 2011-09-15 ENCOUNTER — Ambulatory Visit (INDEPENDENT_AMBULATORY_CARE_PROVIDER_SITE_OTHER): Payer: BC Managed Care – PPO | Admitting: Internal Medicine

## 2011-09-15 VITALS — BP 130/72 | HR 80 | Temp 98.2°F | Resp 18 | Ht 73.0 in | Wt 360.0 lb

## 2011-09-15 DIAGNOSIS — M549 Dorsalgia, unspecified: Secondary | ICD-10-CM

## 2011-09-15 DIAGNOSIS — Z23 Encounter for immunization: Secondary | ICD-10-CM

## 2011-09-15 DIAGNOSIS — Z0000 Encounter for general adult medical examination without abnormal findings: Secondary | ICD-10-CM

## 2011-09-15 DIAGNOSIS — M1710 Unilateral primary osteoarthritis, unspecified knee: Secondary | ICD-10-CM

## 2011-09-15 DIAGNOSIS — I10 Essential (primary) hypertension: Secondary | ICD-10-CM

## 2011-09-15 DIAGNOSIS — R079 Chest pain, unspecified: Secondary | ICD-10-CM

## 2011-09-15 NOTE — Patient Instructions (Addendum)
The patient is instructed to continue all medications as prescribed. Schedule followup with check out clerk upon leaving the clinic I made a referral for you to have a cardiac study to make sure that the chest pain is not cardiac. I have given you samples to take of an acid lowering drug called AcipHex Take one pill daily

## 2011-09-15 NOTE — Progress Notes (Signed)
Subjective:    Patient ID: Franklin Hicks, male    DOB: Jan 20, 1951, 61 y.o.   MRN: [id number]  HPI CPX  progressive knee pain and loosing mobility Has seen [facility] He has morbid obesity that is in part worsened because of his lack of mobility in his inability to exercise.  He has an idiopathic peripheral neuropathy secondary to chronic back disease he has chronic venous hypertension with inflammation and he wears TED hose. Patient has progressive erectile dysfunction is probably secondary to obesity.  He has a new chief complaint today of shortness of breath with exertion he states that he sometimes gets nauseated and sometimes it is relieved with belching he thinks it is coming from his GI tract but it is other risk factors cardiovascular etiology is more probable history of hypertension hyperlipidemia and family history of heart disease.        Review of Systems  Constitutional: Negative for fever and fatigue.  HENT: Negative for hearing loss, congestion, neck pain and postnasal drip.   Eyes: Negative for discharge, redness and visual disturbance.  Respiratory: Negative for cough, shortness of breath and wheezing.   Cardiovascular: Negative for leg swelling.  Gastrointestinal: Negative for abdominal pain, constipation and abdominal distention.  Genitourinary: Negative for urgency and frequency.  Musculoskeletal: Negative for joint swelling and arthralgias.  Skin: Negative for color change and rash.  Neurological: Negative for weakness and light-headedness.  Hematological: Negative for adenopathy.  Psychiatric/Behavioral: Negative for behavioral problems.   Past Medical History  Diagnosis Date  . Gout   . Hypertension   . DJD (degenerative joint disease)   . Obesity     History   Social History  . Marital Status: Married    Spouse Name: N/A    Number of Children: N/A  . Years of Education: N/A   Occupational History  . teacher    Social History Main Topics  .  Smoking status: Former Games developer  . Smokeless tobacco: Not on file  . Alcohol Use: No  . Drug Use: No  . Sexually Active: Not on file   Other Topics Concern  . Not on file   Social History Narrative  . No narrative on file    Past Surgical History  Procedure Date  . US echocardiography 2004    no lvh nl ejection fraction  . Ureteral stone [id number]  . Left knee open meniscetomy     Family History  Problem Relation Age of Onset  . Liver disease Mother     No Known Allergies  Current Outpatient Prescriptions on File Prior to Visit  Medication Sig Dispense Refill  . atorvastatin (LIPITOR) 40 MG tablet TAKE 1 TABLET DAILY  30 tablet  5  . verapamil (CALAN-SR) 240 MG CR tablet TAKE 1 TABLET DAILY  90 tablet  2    BP 130/72  Pulse 80  Temp 98.2 F (36.8 C)  Resp 18  Ht 6\' 1"  (1.854 m)  Wt 360 lb (163.295 kg)  BMI 47.50 kg/m2       Objective:   Physical Exam  Nursing note and vitals reviewed. Constitutional: He is oriented to person, place, and time. He appears well-developed and well-nourished.  HENT:  Head: Normocephalic and atraumatic.  Eyes: Conjunctivae are normal. Pupils are equal, round, and reactive to light.  Neck: Normal range of motion. Neck supple.  Cardiovascular: Normal rate and regular rhythm.   Pulmonary/Chest: Effort normal and breath sounds normal.  Abdominal: Soft. Bowel sounds are normal.  Musculoskeletal: He  exhibits edema and tenderness.  Neurological: He is alert and oriented to person, place, and time.  Skin: Skin is warm and dry.  Psychiatric: He has a normal mood and affect. His behavior is normal.          Assessment & Plan:   Patient presents for yearly preventative medicine examination.   all immunizations and health maintenance protocols were reviewed with the patient and they are up to date with these protocols.   screening laboratory values were reviewed with the patient including screening of hyperlipidemia PSA renal  function and hepatic function.   There medications past medical history social history problem list and allergies were reviewed in detail.   Goals were established with regard to weight loss exercise diet in compliance with medications  Exertional chest pain this is a new diagnosis it is associated with some nausea no diaphoresis he does have hypertension and hyperlipidemia as well as family history his risk factors and he is a 61 year old African American male.  We will for him for a pharmacologic stress test at the hospital due to his obesity.  Again morbid obesity plays a big control the patient's problem list degenerative joint disease is progressive and I am afraid that without knee replacement he will not be able to address the obesity.  Other possibilities would be gastric bypass surgery

## 2011-09-23 ENCOUNTER — Ambulatory Visit (HOSPITAL_COMMUNITY): Payer: BC Managed Care – PPO | Attending: Cardiology | Admitting: Radiology

## 2011-09-23 VITALS — Ht 73.0 in | Wt 360.0 lb

## 2011-09-23 DIAGNOSIS — Z8249 Family history of ischemic heart disease and other diseases of the circulatory system: Secondary | ICD-10-CM | POA: Insufficient documentation

## 2011-09-23 DIAGNOSIS — R0989 Other specified symptoms and signs involving the circulatory and respiratory systems: Secondary | ICD-10-CM | POA: Insufficient documentation

## 2011-09-23 DIAGNOSIS — R0609 Other forms of dyspnea: Secondary | ICD-10-CM | POA: Insufficient documentation

## 2011-09-23 DIAGNOSIS — Z87891 Personal history of nicotine dependence: Secondary | ICD-10-CM | POA: Insufficient documentation

## 2011-09-23 DIAGNOSIS — R002 Palpitations: Secondary | ICD-10-CM | POA: Insufficient documentation

## 2011-09-23 DIAGNOSIS — R079 Chest pain, unspecified: Secondary | ICD-10-CM

## 2011-09-23 DIAGNOSIS — I10 Essential (primary) hypertension: Secondary | ICD-10-CM | POA: Insufficient documentation

## 2011-09-23 DIAGNOSIS — R110 Nausea: Secondary | ICD-10-CM | POA: Insufficient documentation

## 2011-09-23 DIAGNOSIS — R0789 Other chest pain: Secondary | ICD-10-CM | POA: Insufficient documentation

## 2011-09-23 DIAGNOSIS — R0602 Shortness of breath: Secondary | ICD-10-CM

## 2011-09-23 DIAGNOSIS — E785 Hyperlipidemia, unspecified: Secondary | ICD-10-CM | POA: Insufficient documentation

## 2011-09-23 MED ORDER — TECHNETIUM TC 99M TETROFOSMIN IV KIT
33.0000 | PACK | Freq: Once | INTRAVENOUS | Status: AC | PRN
  Administered 2011-09-23: 33 via INTRAVENOUS

## 2011-09-23 MED ORDER — REGADENOSON 0.4 MG/5ML IV SOLN
0.4000 mg | Freq: Once | INTRAVENOUS | Status: AC
  Administered 2011-09-23: 0.4 mg via INTRAVENOUS

## 2011-09-23 NOTE — Progress Notes (Signed)
[facility] SITE 3 NUCLEAR MED [address] [place] [address] [phone]  Cardiology Nuclear Med Study  Franklin Hicks is a 61 y.o. male [id number] 1950/12/28   Nuclear Med Background Indication for Stress Test:  Evaluation for Ischemia History:  '04 Echo: EF=NL, and '04 Myocardial Perfusion Study-Area of reversibility laterally suspicious of peri-infarct ischemia,Inferior wall scar, [facility]) EF=38% Cardiac Risk Factors: Family History - CAD, History of Smoking, Hypertension and Lipids  Symptoms: Chest Pressure with exertion (last occurrence last night),  DOE, Nausea and Rapid HR   Nuclear Pre-Procedure Caffeine/Decaff Intake:  None NPO After: 10:00pm   Lungs:  Clear IV 0.9% NS with Angio Cath:  22g  IV Site: R Forearm x 1, tolerated well IV Started by:  Stanton Kidney, EMT-P  Chest Size (in):  56 Cup Size: n/a  Height: 6\' 1"  (1.854 m)  Weight:  360 lb (163.295 kg)  BMI:  Body mass index is 47.50 kg/(m^2). Tech Comments:  N/A    Nuclear Med Study 1 or 2 day study: 2 day  Stress Test Type:  Lexiscan  Reading MD: Olga Millers, MD  Order Authorizing Provider:  Darryll Capers, MD  Resting Radionuclide: Technetium 52m Tetrofosmin  Resting Radionuclide Dose: 33.0 mCi  On 09-24-11  Stress Radionuclide:  Technetium 19m Tetrofosmin  Stress Radionuclide Dose: 33.0 mCi  On 09-23-11          Stress Protocol Rest HR: 71 Stress HR: 112  Rest BP: 138/80 Stress BP: 145/74  Exercise Time (min): n/a METS: n/a   Predicted Max HR: 158 bpm % Max HR: 70.89 bpm Rate Pressure Product: 41324   Dose of Adenosine (mg):  n/a Dose of Lexiscan: 0.4 mg  Dose of Atropine (mg): n/a Dose of Dobutamine: n/a mcg/kg/min (at max HR)  Stress Test Technologist: Irean Hong, RN  Nuclear Technologist:  Domenic Polite, CNMT     Rest Procedure:  Myocardial perfusion imaging was performed at rest 45 minutes following the intravenous administration of Technetium 60m Tetrofosmin. Rest ECG:  NSR with PVC  Stress Procedure:  The patient received IV Lexiscan 0.4 mg over 15-seconds.  Technetium 6m Tetrofosmin injected at 30-seconds.  There were nonspecific ST-T changes with Lexiscan.  Quantitative spect images were obtained after a 45 minute delay. Stress ECG: No significant change from baseline ECG  QPS Raw Data Images:  Normal; no motion artifact; normal heart/lung ratio. Stress Images:  Mild to moderate basal to mid inferolateral and basal anterolateral perfusion defect.  Rest Images:  Normal homogeneous uptake in all areas of the myocardium. Subtraction (SDS):  Reversible basal to mid inferolateral and basal anterolateral perfusion defect.  Transient Ischemic Dilatation (Normal <1.22):  1.08 Lung/Heart Ratio (Normal <0.45):  038  Quantitative Gated Spect Images QGS EDV:  151 ml QGS ESV:  82 ml QGS cine images:  Global hypokinesis.  QGS EF: 46%  Impression Exercise Capacity:  Lexiscan with no exercise. BP Response:  Normal blood pressure response. Clinical Symptoms:  Lightheaded, headache.  ECG Impression:  No significant ST segment change suggestive of ischemia. Comparison with Prior Nuclear Study: No images to compare  Overall Impression:  Abnormal stress nuclear study.  There is a reversible basal to mid inferolateral and basal anterolateral perfusion defect, mild to moderate in degree.  This suggests ischemia.  There is mild global hypokinesis, EF 46%.   Franklin Hicks [employer]

## 2011-09-24 ENCOUNTER — Ambulatory Visit (HOSPITAL_COMMUNITY): Payer: BC Managed Care – PPO | Attending: Cardiology | Admitting: Radiology

## 2011-09-24 DIAGNOSIS — R0989 Other specified symptoms and signs involving the circulatory and respiratory systems: Secondary | ICD-10-CM

## 2011-09-24 MED ORDER — TECHNETIUM TC 99M TETROFOSMIN IV KIT
33.0000 | PACK | Freq: Once | INTRAVENOUS | Status: AC | PRN
  Administered 2011-09-24: 33 via INTRAVENOUS

## 2011-09-25 ENCOUNTER — Telehealth (HOSPITAL_COMMUNITY): Payer: Self-pay | Admitting: Radiology

## 2011-09-25 ENCOUNTER — Other Ambulatory Visit: Payer: Self-pay | Admitting: Internal Medicine

## 2011-09-25 DIAGNOSIS — I998 Other disorder of circulatory system: Secondary | ICD-10-CM

## 2011-09-25 NOTE — Telephone Encounter (Signed)
Dr. York Ram nurse,Franklin Hicks made aware of abnormal study on [name]. Franklin Hicks

## 2011-10-02 ENCOUNTER — Ambulatory Visit (INDEPENDENT_AMBULATORY_CARE_PROVIDER_SITE_OTHER): Payer: BC Managed Care – PPO | Admitting: Internal Medicine

## 2011-10-02 ENCOUNTER — Encounter: Payer: Self-pay | Admitting: Internal Medicine

## 2011-10-02 DIAGNOSIS — E785 Hyperlipidemia, unspecified: Secondary | ICD-10-CM | POA: Insufficient documentation

## 2011-10-02 DIAGNOSIS — R0602 Shortness of breath: Secondary | ICD-10-CM

## 2011-10-02 DIAGNOSIS — I10 Essential (primary) hypertension: Secondary | ICD-10-CM

## 2011-10-02 DIAGNOSIS — E1169 Type 2 diabetes mellitus with other specified complication: Secondary | ICD-10-CM | POA: Insufficient documentation

## 2011-10-02 NOTE — Assessment & Plan Note (Signed)
Continue meds. 

## 2011-10-02 NOTE — Patient Instructions (Signed)
Call us if you want to set up heart catherization at [phone].

## 2011-10-02 NOTE — Assessment & Plan Note (Signed)
Keep on meds. 

## 2011-10-02 NOTE — Assessment & Plan Note (Signed)
Patient's symptoms are worrisome for angina.  I have reviewed myoview images and there does appear to be definite ischemia.  Patient was very anxious about cath.  Discussed risks/benefits.  He is not ready to commit.  I told him the risks were low and and risk for problems was probably higher if he did nothing. I would keep on same regimen.  He will call when he is ready to schedule.

## 2011-10-02 NOTE — Progress Notes (Signed)
HPI Patient is a 61 year old who is followed by Terrilee Files.  Recently seen.  Complained of SOB with exertion, belching, nausea. Has history of dyslipidemia, HTN, FHx CAD. Myoview was done on 1/29.  It was abnormal with reversible inferolateral defect and basal anterolateral defect.  Mild global hypokinesis with LVEF 46% No Known Allergies  Current Outpatient Prescriptions  Medication Sig Dispense Refill  . aspirin 81 MG tablet Take 81 mg by mouth daily.       Marland Kitchen atorvastatin (LIPITOR) 40 MG tablet TAKE 1 TABLET DAILY  30 tablet  5  . ibuprofen (ADVIL,MOTRIN) 800 MG tablet Take 800 mg by mouth every 8 (eight) hours as needed.      . quinapril (ACCUPRIL) 40 MG tablet       . Saw Palmetto 1000 MG CAPS Take by mouth daily.      . verapamil (CALAN-SR) 240 MG CR tablet TAKE 1 TABLET DAILY  90 tablet  2    Past Medical History  Diagnosis Date  . Gout   . Hypertension   . DJD (degenerative joint disease)   . Obesity     Past Surgical History  Procedure Date  . US echocardiography 2004    no lvh nl ejection fraction  . Ureteral stone [id number]  . Left knee open meniscetomy     Family History  Problem Relation Age of Onset  . Liver disease Mother     History   Social History  . Marital Status: Married    Spouse Name: N/A    Number of Children: N/A  . Years of Education: N/A   Occupational History  . teacher    Social History Main Topics  . Smoking status: Former Games developer  . Smokeless tobacco: Not on file  . Alcohol Use: No  . Drug Use: No  . Sexually Active: Not on file   Other Topics Concern  . Not on file   Social History Narrative  . No narrative on file    Review of Systems:  All systems reviewed.  They are negative to the above problem except as previously stated.  Vital Signs: BP 138/78  Pulse 80  Ht 6' (1.829 m)  Wt 374 lb (169.645 kg)  BMI 50.72 kg/m2  Physical Exam Patient is an obese 61 year old in NAD HEENT:  Normocephalic, atraumatic. EOMI,  PERRLA.  Neck: Unable to assess JVP. No bruits.  Lungs: clear to auscultation. No rales no wheezes.  Heart: Regular rate and rhythm. Normal S1, S2. No S3.   No significant murmurs. PMI not displaced.  Abdomen:  Supple, nontender. Normal bowel sounds. No masses. No hepatomegaly.  Extremities:   Good distal pulses throughout. Tr lower extremity edema.  Musculoskeletal :moving all extremities.  Neuro:   alert and oriented x3.  CN II-XII grossly intact.    Assessment and Plan:

## 2011-11-14 ENCOUNTER — Encounter: Payer: Self-pay | Admitting: Internal Medicine

## 2011-11-14 ENCOUNTER — Ambulatory Visit (INDEPENDENT_AMBULATORY_CARE_PROVIDER_SITE_OTHER): Payer: BC Managed Care – PPO | Admitting: Internal Medicine

## 2011-11-14 VITALS — BP 130/80 | HR 80 | Temp 98.0°F | Resp 18 | Ht 72.0 in | Wt 370.0 lb

## 2011-11-14 DIAGNOSIS — I2510 Atherosclerotic heart disease of native coronary artery without angina pectoris: Secondary | ICD-10-CM

## 2011-11-14 MED ORDER — NITROGLYCERIN 0.4 MG SL SUBL: 0.4000 mg | SUBLINGUAL_TABLET | SUBLINGUAL | Status: DC | PRN

## 2011-11-14 NOTE — Patient Instructions (Signed)
Look at part B and part F insurance Look as change to medicare being your 1st and BCBS as secondary  Since you are 60.   Look would you be eligible for management medicare BCBS   "blue advantage medicare"

## 2011-11-14 NOTE — Progress Notes (Signed)
Subjective:    Patient ID: Franklin Hicks, male    DOB: [birth year], 61 y.o.   MRN: [id number]  HPI He is on the state plan as a retired Runner, broadcasting/film/video and has part a only with 70/30 coverage. He had a stress test with inferior ischemia and he needs a cardiac cath. He has seen cardiology and has been evaluated for a stress test. He needs this prior to the knee replacement. He is concerned about his ability to pay for these tests and has put off for testing due to this. His risk factors are morbid obesity hypertension hyperlipidemia medical noncompliance and immobility. We had a lengthy discussion today that he is a "walking time bomb"   Review of Systems  Constitutional: Negative for fever and fatigue.  HENT: Negative for hearing loss, congestion, neck pain and postnasal drip.   Eyes: Negative for discharge, redness and visual disturbance.  Respiratory: Positive for shortness of breath and stridor. Negative for cough and wheezing.   Cardiovascular: Negative for leg swelling.  Gastrointestinal: Negative for abdominal pain, constipation and abdominal distention.  Genitourinary: Negative for urgency and frequency.  Musculoskeletal: Positive for myalgias, back pain, joint swelling, arthralgias and gait problem.  Skin: Negative for color change and rash.  Neurological: Negative for weakness and light-headedness.  Hematological: Negative for adenopathy.  Psychiatric/Behavioral: Negative for behavioral problems.   Past Medical History  Diagnosis Date  . Gout   . Hypertension   . DJD (degenerative joint disease)   . Obesity     History   Social History  . Marital Status: Married    Spouse Name: N/A    Number of Children: N/A  . Years of Education: N/A   Occupational History  . teacher    Social History Main Topics  . Smoking status: Former Games developer  . Smokeless tobacco: Not on file  . Alcohol Use: No  . Drug Use: No  . Sexually Active: Not on file   Other Topics Concern  . Not on file    Social History Narrative  . No narrative on file    Past Surgical History  Procedure Date  . US echocardiography 2004    no lvh nl ejection fraction  . Ureteral stone [id number]  . Left knee open meniscetomy     Family History  Problem Relation Age of Onset  . Liver disease Mother     No Known Allergies  Current Outpatient Prescriptions on File Prior to Visit  Medication Sig Dispense Refill  . aspirin 81 MG tablet Take 81 mg by mouth daily.       Marland Kitchen atorvastatin (LIPITOR) 40 MG tablet TAKE 1 TABLET DAILY  30 tablet  5  . ibuprofen (ADVIL,MOTRIN) 800 MG tablet Take 800 mg by mouth every 8 (eight) hours as needed.      . quinapril (ACCUPRIL) 40 MG tablet       . Saw Palmetto 1000 MG CAPS Take by mouth daily.      . verapamil (CALAN-SR) 240 MG CR tablet TAKE 1 TABLET DAILY  90 tablet  2    BP 130/80  Pulse 80  Temp 98 F (36.7 C)  Resp 18  Ht 6' (1.829 m)  Wt 370 lb (167.831 kg)  BMI 50.18 kg/m2       Objective:   Physical Exam  Nursing note and vitals reviewed. Constitutional:       Morbidly obese African American male  HENT:  Head: Normocephalic and atraumatic.  Eyes: Conjunctivae are normal. Pupils  are equal, round, and reactive to light.  Neck: Normal range of motion. Neck supple.  Cardiovascular: Normal rate and regular rhythm.   Pulmonary/Chest: Effort normal and breath sounds normal. He exhibits tenderness.  Abdominal: Soft. Bowel sounds are normal. He exhibits distension.  Musculoskeletal: He exhibits edema and tenderness.          Assessment & Plan:  It is imperative given his risk factors as he proceed with cardiac catheterization. I discussed this with him in detail suggesting that he sit down with a Medicare specialist since he is now 61 years of age and could qualify for early Medicare. He has the teacher's retirement plan. Even if he is unable to obtain better insurance coverage he should proceed with cardiac catheterization and apply through  [facility] health for assistance.  We have discussed his morbid obesity for some time now and I have urged him to consider obesity surgery but this must take a second place to his cardiac risks.  I also recommended that his knee replacement he put off until the cardiac risk can be assessed  I have spent more than 30 minutes examining this patient face-to-face of which over half was spent in counseling

## 2012-01-15 ENCOUNTER — Other Ambulatory Visit: Payer: Self-pay | Admitting: *Deleted

## 2012-01-15 MED ORDER — ATORVASTATIN CALCIUM 40 MG PO TABS: 40.0000 mg | ORAL_TABLET | Freq: Every day | ORAL | Status: DC

## 2012-01-16 ENCOUNTER — Ambulatory Visit: Payer: BC Managed Care – PPO | Admitting: Internal Medicine

## 2012-03-03 ENCOUNTER — Ambulatory Visit: Payer: BC Managed Care – PPO | Admitting: Internal Medicine

## 2012-04-07 ENCOUNTER — Ambulatory Visit: Payer: BC Managed Care – PPO | Admitting: Internal Medicine

## 2012-04-14 ENCOUNTER — Encounter: Payer: Self-pay | Admitting: Internal Medicine

## 2012-04-14 ENCOUNTER — Ambulatory Visit (INDEPENDENT_AMBULATORY_CARE_PROVIDER_SITE_OTHER): Payer: Medicare Other | Admitting: Internal Medicine

## 2012-04-14 VITALS — BP 122/70 | HR 80 | Temp 98.2°F | Resp 18 | Ht 72.0 in | Wt 370.0 lb

## 2012-04-14 DIAGNOSIS — M1710 Unilateral primary osteoarthritis, unspecified knee: Secondary | ICD-10-CM

## 2012-04-14 DIAGNOSIS — I10 Essential (primary) hypertension: Secondary | ICD-10-CM

## 2012-04-14 NOTE — Progress Notes (Signed)
Subjective:    Patient ID: Franklin Hicks, male    DOB: 1951-01-12, 61 y.o.   MRN: [id number]  Hypertension Pertinent negatives include no neck pain or shortness of breath.   Severe DJD of the knees Needs surgery Has medicare   Review of Systems  Constitutional: Negative for fever and fatigue.  HENT: Negative for hearing loss, congestion, neck pain and postnasal drip.   Eyes: Negative for discharge, redness and visual disturbance.  Respiratory: Negative for cough, shortness of breath and wheezing.   Cardiovascular: Negative for leg swelling.  Gastrointestinal: Negative for abdominal pain, constipation and abdominal distention.  Genitourinary: Negative for urgency and frequency.  Musculoskeletal: Negative for joint swelling and arthralgias.  Skin: Negative for color change and rash.  Neurological: Negative for weakness and light-headedness.  Hematological: Negative for adenopathy.  Psychiatric/Behavioral: Negative for behavioral problems.   Past Medical History  Diagnosis Date  . Gout   . Hypertension   . DJD (degenerative joint disease)   . Obesity     History   Social History  . Marital Status: Married    Spouse Name: N/A    Number of Children: N/A  . Years of Education: N/A   Occupational History  . teacher    Social History Main Topics  . Smoking status: Former Games developer  . Smokeless tobacco: Not on file  . Alcohol Use: No  . Drug Use: No  . Sexually Active: Not on file   Other Topics Concern  . Not on file   Social History Narrative  . No narrative on file    Past Surgical History  Procedure Date  . US echocardiography 2004    no lvh nl ejection fraction  . Ureteral stone [id number]  . Left knee open meniscetomy     Family History  Problem Relation Age of Onset  . Liver disease Mother     No Known Allergies  Current Outpatient Prescriptions on File Prior to Visit  Medication Sig Dispense Refill  . aspirin 81 MG tablet Take 81 mg by mouth daily.        Marland Kitchen atorvastatin (LIPITOR) 40 MG tablet Take 1 tablet (40 mg total) by mouth daily.  90 tablet  3  . ibuprofen (ADVIL,MOTRIN) 800 MG tablet Take 800 mg by mouth every 8 (eight) hours as needed.      . nitroGLYCERIN (NITROSTAT) 0.4 MG SL tablet Place 1 tablet (0.4 mg total) under the tongue every 5 (five) minutes as needed for chest pain.  50 tablet  3  . quinapril (ACCUPRIL) 40 MG tablet       . Saw Palmetto 1000 MG CAPS Take by mouth daily.      . verapamil (CALAN-SR) 240 MG CR tablet TAKE 1 TABLET DAILY  90 tablet  2    BP 122/70  Pulse 80  Temp 98.2 F (36.8 C)  Resp 18  Ht 6' (1.829 m)  Wt 370 lb (167.831 kg)  BMI 50.18 kg/m2        Objective:   Physical Exam  Nursing note and vitals reviewed. Constitutional: He appears well-developed and well-nourished.  HENT:  Head: Normocephalic and atraumatic.  Eyes: Conjunctivae are normal. Pupils are equal, round, and reactive to light.  Neck: Normal range of motion. Neck supple.  Cardiovascular: Normal rate and regular rhythm.   Pulmonary/Chest: Effort normal and breath sounds normal.  Abdominal: Soft. Bowel sounds are normal.          Assessment & Plan:  reviewed weight and  Paleo diet Discussed the need for knee replacement  HTN stable     I have spent more than 30 minutes examining this patient face-to-face of which over half was spent in counselingABOUT OBESITY AND DIET

## 2012-07-15 ENCOUNTER — Encounter: Payer: Self-pay | Admitting: Internal Medicine

## 2012-07-15 ENCOUNTER — Ambulatory Visit (INDEPENDENT_AMBULATORY_CARE_PROVIDER_SITE_OTHER): Payer: Medicare Other | Admitting: Internal Medicine

## 2012-07-15 VITALS — BP 134/80 | HR 80 | Temp 98.0°F | Resp 18 | Ht 72.0 in | Wt 370.0 lb

## 2012-07-15 DIAGNOSIS — E669 Obesity, unspecified: Secondary | ICD-10-CM

## 2012-07-15 DIAGNOSIS — M25569 Pain in unspecified knee: Secondary | ICD-10-CM

## 2012-07-15 DIAGNOSIS — I10 Essential (primary) hypertension: Secondary | ICD-10-CM

## 2012-07-15 DIAGNOSIS — Z23 Encounter for immunization: Secondary | ICD-10-CM

## 2012-07-15 NOTE — Patient Instructions (Signed)
The patient is instructed to continue all medications as prescribed. Schedule followup with check out clerk upon leaving the clinic  

## 2012-07-15 NOTE — Progress Notes (Signed)
Subjective:    Patient ID: Franklin Hicks, male    DOB: [birth year], 61 y.o.   MRN: [id number]  HPI  Requesting drivers CDL Increased BPH symptoms Continues morbid obesity with complications including severe DJD   Review of Systems  Constitutional: Negative for fever and fatigue.  HENT: Negative for hearing loss, congestion, neck pain and postnasal drip.   Eyes: Negative for discharge, redness and visual disturbance.  Respiratory: Negative for cough, shortness of breath and wheezing.   Cardiovascular: Negative for leg swelling.  Gastrointestinal: Negative for abdominal pain, constipation and abdominal distention.  Genitourinary: Negative for urgency and frequency.  Musculoskeletal: Negative for joint swelling and arthralgias.  Skin: Negative for color change and rash.  Neurological: Negative for weakness and light-headedness.  Hematological: Negative for adenopathy.  Psychiatric/Behavioral: Negative for behavioral problems.   Past Medical History  Diagnosis Date  . Gout   . Hypertension   . DJD (degenerative joint disease)   . Obesity     History   Social History  . Marital Status: Married    Spouse Name: N/A    Number of Children: N/A  . Years of Education: N/A   Occupational History  . teacher    Social History Main Topics  . Smoking status: Former Games developer  . Smokeless tobacco: Not on file  . Alcohol Use: No  . Drug Use: No  . Sexually Active: Not on file   Other Topics Concern  . Not on file   Social History Narrative  . No narrative on file    Past Surgical History  Procedure Date  . US echocardiography 2004    no lvh nl ejection fraction  . Ureteral stone [id number]  . Left knee open meniscetomy     Family History  Problem Relation Age of Onset  . Liver disease Mother     No Known Allergies  Current Outpatient Prescriptions on File Prior to Visit  Medication Sig Dispense Refill  . aspirin 81 MG tablet Take 81 mg by mouth daily.       Marland Kitchen  atorvastatin (LIPITOR) 40 MG tablet Take 1 tablet (40 mg total) by mouth daily.  90 tablet  3  . ibuprofen (ADVIL,MOTRIN) 800 MG tablet Take 800 mg by mouth every 8 (eight) hours as needed.      . nitroGLYCERIN (NITROSTAT) 0.4 MG SL tablet Place 1 tablet (0.4 mg total) under the tongue every 5 (five) minutes as needed for chest pain.  50 tablet  3  . quinapril (ACCUPRIL) 40 MG tablet       . Saw Palmetto 1000 MG CAPS Take by mouth daily.      . verapamil (CALAN-SR) 240 MG CR tablet TAKE 1 TABLET DAILY  90 tablet  2    BP 134/80  Pulse 80  Temp 98 F (36.7 C)  Resp 18  Ht 6' (1.829 m)  Wt 370 lb (167.831 kg)  BMI 50.18 kg/m2       Objective:   Physical Exam  Nursing note and vitals reviewed. Constitutional: He appears well-developed and well-nourished.  HENT:  Head: Normocephalic and atraumatic.  Eyes: Conjunctivae normal are normal. Pupils are equal, round, and reactive to light.  Neck: Normal range of motion. Neck supple.  Cardiovascular: Normal rate and regular rhythm.   Pulmonary/Chest: Effort normal and breath sounds normal.  Abdominal: Soft. Bowel sounds are normal.          Assessment & Plan:  CDL form Reviewed the paleo diet Urinary incontinency with  urge Weight on bladder Needs weight loss ?flomax trial

## 2012-08-09 ENCOUNTER — Other Ambulatory Visit: Payer: Self-pay | Admitting: Internal Medicine

## 2012-10-05 ENCOUNTER — Telehealth: Payer: Self-pay | Admitting: Internal Medicine

## 2012-10-05 MED ORDER — QUINAPRIL HCL 40 MG PO TABS: 40.0000 mg | ORAL_TABLET | Freq: Every day | ORAL | Status: DC

## 2012-10-05 MED ORDER — VERAPAMIL HCL ER 240 MG PO TBCR: 240.0000 mg | EXTENDED_RELEASE_TABLET | Freq: Every day | ORAL | Status: DC

## 2012-10-05 NOTE — Telephone Encounter (Signed)
Patient called stating that he need a refill of his verapamil 240 mg 1poqd, quinapril 40 mg 1poqd sent to [facility] on [address]. Please assist.

## 2012-10-05 NOTE — Telephone Encounter (Signed)
sent 

## 2012-10-18 ENCOUNTER — Ambulatory Visit (INDEPENDENT_AMBULATORY_CARE_PROVIDER_SITE_OTHER): Payer: PRIVATE HEALTH INSURANCE | Admitting: Internal Medicine

## 2012-10-18 ENCOUNTER — Encounter: Payer: Self-pay | Admitting: Internal Medicine

## 2012-10-18 VITALS — BP 130/80 | HR 88 | Temp 98.2°F | Resp 18 | Ht 72.0 in | Wt 370.0 lb

## 2012-10-18 DIAGNOSIS — E785 Hyperlipidemia, unspecified: Secondary | ICD-10-CM

## 2012-10-18 DIAGNOSIS — I82409 Acute embolism and thrombosis of unspecified deep veins of unspecified lower extremity: Secondary | ICD-10-CM

## 2012-10-18 DIAGNOSIS — I82402 Acute embolism and thrombosis of unspecified deep veins of left lower extremity: Secondary | ICD-10-CM

## 2012-10-18 DIAGNOSIS — R0602 Shortness of breath: Secondary | ICD-10-CM

## 2012-10-18 DIAGNOSIS — I739 Peripheral vascular disease, unspecified: Secondary | ICD-10-CM

## 2012-10-18 DIAGNOSIS — I10 Essential (primary) hypertension: Secondary | ICD-10-CM

## 2012-10-18 DIAGNOSIS — E669 Obesity, unspecified: Secondary | ICD-10-CM

## 2012-10-18 NOTE — Addendum Note (Signed)
Addended by: Willy Eddy on: 10/18/2012 03:22 PM   Modules accepted: Orders

## 2012-10-18 NOTE — Progress Notes (Signed)
  Subjective:    Patient ID: Franklin Hicks, male    DOB: 1951/08/19, 62 y.o.   MRN: [id number]  HPI  Cough for 30 days with dry cough and clearing throat Improved but still present Has cramp and sprain in left calf muscle Stiff and sore with walking that improves with walking but recurrent after sitting   Review of Systems  Constitutional: Negative for fever and fatigue.  HENT: Positive for congestion, rhinorrhea and postnasal drip. Negative for hearing loss and neck pain.   Eyes: Negative for discharge, redness and visual disturbance.  Respiratory: Negative for cough, shortness of breath and wheezing.   Cardiovascular: Positive for leg swelling.  Gastrointestinal: Positive for abdominal pain and abdominal distention. Negative for constipation.  Genitourinary: Negative for urgency and frequency.  Musculoskeletal: Negative for joint swelling and arthralgias.  Skin: Negative for color change and rash.       Toe fungus  Neurological: Positive for weakness. Negative for light-headedness.  Hematological: Negative for adenopathy.  Psychiatric/Behavioral: Negative for behavioral problems.       Objective:   Physical Exam  Constitutional: He appears well-developed and well-nourished.  Morbid obesity  HENT:  Head: Normocephalic and atraumatic.  Swollen turbinate  Cardiovascular: Normal rate and regular rhythm.   Murmur heard. Pulmonary/Chest: Effort normal and breath sounds normal.  Abdominal: Soft. He exhibits distension. There is tenderness.  extremity with edema and varicosities         Assessment & Plan:  URI with PN drip and cough Chronic venous stasis PAD HTN stable Obesity discussed diet and weight loss Knee pain and need for TKR Homans positive so will need [place] of LLE

## 2012-10-18 NOTE — Patient Instructions (Signed)
1 cup of white vinegar in 1/2 gallon of warm water

## 2013-03-02 ENCOUNTER — Other Ambulatory Visit (INDEPENDENT_AMBULATORY_CARE_PROVIDER_SITE_OTHER): Payer: PRIVATE HEALTH INSURANCE

## 2013-03-02 DIAGNOSIS — Z0000 Encounter for general adult medical examination without abnormal findings: Secondary | ICD-10-CM

## 2013-03-02 DIAGNOSIS — E785 Hyperlipidemia, unspecified: Secondary | ICD-10-CM

## 2013-03-02 DIAGNOSIS — I10 Essential (primary) hypertension: Secondary | ICD-10-CM

## 2013-03-02 LAB — POCT URINALYSIS DIPSTICK
Bilirubin, UA: NEGATIVE
Ketones, UA: NEGATIVE
Leukocytes, UA: NEGATIVE
pH, UA: 5.5

## 2013-03-02 LAB — BASIC METABOLIC PANEL
Calcium: 8.9 mg/dL (ref 8.4–10.5)
GFR: 83.26 mL/min (ref >60.00)
Sodium: 143 mEq/L (ref 135–145)

## 2013-03-02 LAB — LIPID PANEL
Cholesterol: 145 mg/dL (ref 0–200)
HDL: 45 mg/dL (ref >39.00)
Triglycerides: 60 mg/dL (ref 0.0–149.0)

## 2013-03-02 LAB — HEPATIC FUNCTION PANEL
ALT: 24 U/L (ref 0–53)
Alkaline Phosphatase: 77 U/L (ref 39–117)
Bilirubin, Direct: 0.1 mg/dL (ref 0.0–0.3)
Total Protein: 6.8 g/dL (ref 6.0–8.3)

## 2013-03-02 LAB — CBC WITH DIFFERENTIAL/PLATELET
Basophils Absolute: 0 10*3/uL (ref 0.0–0.1)
HCT: 42.9 % (ref 39.0–52.0)
Lymphs Abs: 2.7 10*3/uL (ref 0.7–4.0)
MCV: 90.3 fl (ref 78.0–100.0)
Monocytes Absolute: 0.7 10*3/uL (ref 0.1–1.0)
Platelets: 244 10*3/uL (ref 150.0–400.0)
RDW: 14.7 % — ABNORMAL HIGH (ref 11.5–14.6)

## 2013-03-03 ENCOUNTER — Other Ambulatory Visit: Payer: PRIVATE HEALTH INSURANCE

## 2013-03-07 ENCOUNTER — Ambulatory Visit (INDEPENDENT_AMBULATORY_CARE_PROVIDER_SITE_OTHER): Payer: PRIVATE HEALTH INSURANCE | Admitting: Internal Medicine

## 2013-03-07 ENCOUNTER — Encounter: Payer: Self-pay | Admitting: Internal Medicine

## 2013-03-07 VITALS — BP 114/78 | HR 80 | Temp 98.2°F | Resp 20 | Ht 72.0 in | Wt 360.0 lb

## 2013-03-07 DIAGNOSIS — Z0000 Encounter for general adult medical examination without abnormal findings: Secondary | ICD-10-CM

## 2013-03-07 NOTE — Progress Notes (Signed)
Subjective:    Patient ID: Franklin Hicks, male    DOB: [birth year], 62 y.o.   MRN: [id number]  HPI Patient is a 62 year old male who presents for his annual examination he has a history of benign prostatic hypertrophy chronic venous stasis hypertension with inflammation due to morbid obesity history of dyslipidemia a history of gouty arthritis history of hypertension and fasting hyperglycemia   Review of Systems  Constitutional: Positive for activity change and fatigue.  HENT: Positive for rhinorrhea and postnasal drip.   Respiratory: Positive for shortness of breath.   Cardiovascular: Positive for leg swelling.  Gastrointestinal: Positive for abdominal pain and abdominal distention.  Genitourinary: Positive for frequency.       Nocturia  Musculoskeletal: Positive for joint swelling and gait problem.  Neurological: Positive for weakness.  Hematological: Negative.   Psychiatric/Behavioral: Negative.    Past Medical History  Diagnosis Date  . Gout   . Hypertension   . DJD (degenerative joint disease)   . Obesity     History   Social History  . Marital Status: Married    Spouse Name: N/A    Number of Children: N/A  . Years of Education: N/A   Occupational History  . teacher    Social History Main Topics  . Smoking status: Former Games developer  . Smokeless tobacco: Not on file  . Alcohol Use: No  . Drug Use: No  . Sexually Active: Not on file   Other Topics Concern  . Not on file   Social History Narrative  . No narrative on file    Past Surgical History  Procedure Laterality Date  . US echocardiography  2004    no lvh nl ejection fraction  . Ureteral stone  [id number]  . Left knee open meniscetomy      Family History  Problem Relation Age of Onset  . Liver disease Mother     No Known Allergies  Current Outpatient Prescriptions on File Prior to Visit  Medication Sig Dispense Refill  . aspirin 81 MG tablet Take 81 mg by mouth daily.       Marland Kitchen atorvastatin  (LIPITOR) 40 MG tablet Take 1 tablet (40 mg total) by mouth daily.  90 tablet  3  . ibuprofen (ADVIL,MOTRIN) 800 MG tablet Take 800 mg by mouth every 8 (eight) hours as needed.      . quinapril (ACCUPRIL) 40 MG tablet Take 1 tablet (40 mg total) by mouth daily.  90 tablet  3  . Saw Palmetto 1000 MG CAPS Take by mouth daily.      . verapamil (CALAN-SR) 240 MG CR tablet Take 1 tablet (240 mg total) by mouth daily.  90 tablet  3   No current facility-administered medications on file prior to visit.    BP 114/78  Pulse 80  Temp(Src) 98.2 F (36.8 C)  Resp 20  Ht 6' (1.829 m)  Wt 360 lb (163.295 kg)  BMI 48.81 kg/m2        Objective:   Physical Exam  Nursing note and vitals reviewed. Constitutional: He is oriented to person, place, and time.  Morbidly obese African American male  Cardiovascular: Normal rate.   Murmur heard. Pulmonary/Chest: Effort normal and breath sounds normal.  Genitourinary:  2+ enlarged prostate  Musculoskeletal: He exhibits edema and tenderness.  Chronic advanced degenerative disease to the knees bilaterally with mal-alignment  Neurological: He is alert and oriented to person, place, and time.  Skin: Skin is dry. Rash noted. There is erythema.  2+ pitting edema with chronic venous stasis changes to the lower extremities  Psychiatric: He has a normal mood and affect. His behavior is normal.          Assessment & Plan:   Patient presents for yearly preventative medicine examination.   all immunizations and health maintenance protocols were reviewed with the patient and they are up to date with these protocols.   screening laboratory values were reviewed with the patient including screening of hyperlipidemia PSA renal function and hepatic function.   There medications past medical history social history problem list and allergies were reviewed in detail.   Goals were established with regard to weight loss exercise diet in compliance with  medications Morbid obesity Severe degenerative knee disease

## 2013-03-07 NOTE — Patient Instructions (Signed)
Gluten free for me is   Meat, veggies, nuts and fruit.  I avoid all processed foods  Three eggs with onions and pepper  With fruit ( blueberries and pineapple)  Grilled chicken breast,  Zucchini  yellow squash and carrots  Chicken grilled   Tomato strawberry and  Blueberry with lime juice and a little bit of virginal oral  And mashed squash with walnuts   Pick a meet or protein source and pair it with a veggie and fruit

## 2013-05-04 ENCOUNTER — Other Ambulatory Visit: Payer: Self-pay | Admitting: Internal Medicine

## 2013-07-13 ENCOUNTER — Encounter (HOSPITAL_COMMUNITY): Payer: Self-pay | Admitting: Emergency Medicine

## 2013-07-13 ENCOUNTER — Emergency Department (HOSPITAL_COMMUNITY): Payer: PRIVATE HEALTH INSURANCE

## 2013-07-13 ENCOUNTER — Inpatient Hospital Stay (HOSPITAL_COMMUNITY)
Admission: EM | Admit: 2013-07-13 | Discharge: 2013-07-14 | DRG: 176 | Disposition: A | Discharge status: Home / Self Care | Payer: PRIVATE HEALTH INSURANCE | Attending: Internal Medicine | Admitting: Internal Medicine

## 2013-07-13 DIAGNOSIS — E669 Obesity, unspecified: Secondary | ICD-10-CM | POA: Diagnosis present

## 2013-07-13 DIAGNOSIS — Z86711 Personal history of pulmonary embolism: Secondary | ICD-10-CM | POA: Diagnosis present

## 2013-07-13 DIAGNOSIS — I2699 Other pulmonary embolism without acute cor pulmonale: Principal | ICD-10-CM | POA: Diagnosis present

## 2013-07-13 DIAGNOSIS — Z23 Encounter for immunization: Secondary | ICD-10-CM

## 2013-07-13 DIAGNOSIS — R000 Tachycardia, unspecified: Secondary | ICD-10-CM | POA: Diagnosis present

## 2013-07-13 DIAGNOSIS — T783XXD Angioneurotic edema, subsequent encounter: Secondary | ICD-10-CM

## 2013-07-13 DIAGNOSIS — M79609 Pain in unspecified limb: Secondary | ICD-10-CM

## 2013-07-13 DIAGNOSIS — R0600 Dyspnea, unspecified: Secondary | ICD-10-CM

## 2013-07-13 DIAGNOSIS — N400 Enlarged prostate without lower urinary tract symptoms: Secondary | ICD-10-CM | POA: Diagnosis present

## 2013-07-13 DIAGNOSIS — T783XXA Angioneurotic edema, initial encounter: Secondary | ICD-10-CM

## 2013-07-13 DIAGNOSIS — Z7982 Long term (current) use of aspirin: Secondary | ICD-10-CM

## 2013-07-13 DIAGNOSIS — M109 Gout, unspecified: Secondary | ICD-10-CM | POA: Diagnosis present

## 2013-07-13 DIAGNOSIS — I10 Essential (primary) hypertension: Secondary | ICD-10-CM | POA: Diagnosis present

## 2013-07-13 DIAGNOSIS — R0789 Other chest pain: Secondary | ICD-10-CM

## 2013-07-13 DIAGNOSIS — R609 Edema, unspecified: Secondary | ICD-10-CM

## 2013-07-13 DIAGNOSIS — I152 Hypertension secondary to endocrine disorders: Secondary | ICD-10-CM | POA: Diagnosis present

## 2013-07-13 DIAGNOSIS — M7989 Other specified soft tissue disorders: Secondary | ICD-10-CM | POA: Diagnosis present

## 2013-07-13 DIAGNOSIS — Z79899 Other long term (current) drug therapy: Secondary | ICD-10-CM

## 2013-07-13 DIAGNOSIS — E1159 Type 2 diabetes mellitus with other circulatory complications: Secondary | ICD-10-CM | POA: Diagnosis present

## 2013-07-13 DIAGNOSIS — T465X5A Adverse effect of other antihypertensive drugs, initial encounter: Secondary | ICD-10-CM | POA: Diagnosis present

## 2013-07-13 DIAGNOSIS — Z87891 Personal history of nicotine dependence: Secondary | ICD-10-CM

## 2013-07-13 DIAGNOSIS — Z5189 Encounter for other specified aftercare: Secondary | ICD-10-CM

## 2013-07-13 DIAGNOSIS — E042 Nontoxic multinodular goiter: Secondary | ICD-10-CM

## 2013-07-13 DIAGNOSIS — Z6841 Body Mass Index (BMI) 40.0 and over, adult: Secondary | ICD-10-CM

## 2013-07-13 LAB — CBC WITH DIFFERENTIAL/PLATELET
Basophils Absolute: 0 10*3/uL (ref 0.0–0.1)
Eosinophils Relative: 2 % (ref 0–5)
HCT: 45.5 % (ref 39.0–52.0)
Hemoglobin: 15.1 g/dL (ref 13.0–17.0)
Lymphocytes Relative: 21 % (ref 12–46)
Lymphs Abs: 2.3 10*3/uL (ref 0.7–4.0)
MCH: 29.5 pg (ref 26.0–34.0)
MCV: 88.9 fL (ref 78.0–100.0)
Monocytes Absolute: 0.9 10*3/uL (ref 0.1–1.0)
Monocytes Relative: 8 % (ref 3–12)
Neutro Abs: 7.8 10*3/uL — ABNORMAL HIGH (ref 1.7–7.7)
Platelets: 246 10*3/uL (ref 150–400)
RBC: 5.12 MIL/uL (ref 4.22–5.81)
WBC: 11.2 10*3/uL — ABNORMAL HIGH (ref 4.0–10.5)

## 2013-07-13 LAB — URINALYSIS, ROUTINE W REFLEX MICROSCOPIC
Bilirubin Urine: NEGATIVE
Leukocytes, UA: NEGATIVE
Specific Gravity, Urine: 1.03 (ref 1.005–1.030)
Urobilinogen, UA: 0.2 mg/dL (ref 0.0–1.0)
pH: 5.5 (ref 5.0–8.0)

## 2013-07-13 LAB — COMPREHENSIVE METABOLIC PANEL
ALT: 18 U/L (ref 0–53)
AST: 19 U/L (ref 0–37)
Albumin: 3.5 g/dL (ref 3.5–5.2)
Alkaline Phosphatase: 100 U/L (ref 39–117)
BUN: 19 mg/dL (ref 6–23)
Chloride: 102 mEq/L (ref 96–112)
GFR calc Af Amer: 74 mL/min — ABNORMAL LOW (ref >90)
Glucose, Bld: 130 mg/dL — ABNORMAL HIGH (ref 70–99)
Potassium: 4.1 mEq/L (ref 3.5–5.1)
Sodium: 139 mEq/L (ref 135–145)
Total Bilirubin: 0.4 mg/dL (ref 0.3–1.2)
Total Protein: 8.1 g/dL (ref 6.0–8.3)

## 2013-07-13 LAB — HEPARIN LEVEL (UNFRACTIONATED)
Heparin Unfractionated: 0.3 [IU]/mL (ref 0.30–0.70)
Heparin Unfractionated: 0.32 IU/mL (ref 0.30–0.70)

## 2013-07-13 LAB — PROTIME-INR
INR: 1.06 (ref 0.00–1.49)
Prothrombin Time: 13.6 seconds (ref 11.6–15.2)

## 2013-07-13 LAB — APTT: aPTT: 31 seconds (ref 24–37)

## 2013-07-13 LAB — URINE MICROSCOPIC-ADD ON

## 2013-07-13 LAB — ANTITHROMBIN III: AntiThromb III Func: 75 % (ref 75–120)

## 2013-07-13 MED ORDER — MORPHINE SULFATE 4 MG/ML IJ SOLN
4.0000 mg | Freq: Once | INTRAMUSCULAR | Status: AC
  Administered 2013-07-13: 4 mg via INTRAVENOUS
  Filled 2013-07-13: qty 1

## 2013-07-13 MED ORDER — ATORVASTATIN CALCIUM 40 MG PO TABS
40.0000 mg | ORAL_TABLET | Freq: Every day | ORAL | Status: DC
  Administered 2013-07-13: 40 mg via ORAL
  Filled 2013-07-13 (×2): qty 1

## 2013-07-13 MED ORDER — IOHEXOL 350 MG/ML SOLN
125.0000 mL | Freq: Once | INTRAVENOUS | Status: AC | PRN
  Administered 2013-07-13: 125 mL via INTRAVENOUS

## 2013-07-13 MED ORDER — VITAMIN B-12 100 MCG PO TABS
100.0000 ug | ORAL_TABLET | Freq: Every day | ORAL | Status: DC
  Administered 2013-07-13: 100 ug via ORAL
  Filled 2013-07-13 (×3): qty 1

## 2013-07-13 MED ORDER — SODIUM CHLORIDE 0.9 % IJ SOLN
3.0000 mL | Freq: Two times a day (BID) | INTRAMUSCULAR | Status: DC
  Administered 2013-07-13: 3 mL via INTRAVENOUS

## 2013-07-13 MED ORDER — ACETAMINOPHEN 650 MG RE SUPP: 650.0000 mg | Freq: Four times a day (QID) | RECTAL | Status: DC | PRN

## 2013-07-13 MED ORDER — ACETAMINOPHEN 325 MG PO TABS: 650.0000 mg | ORAL_TABLET | Freq: Four times a day (QID) | ORAL | Status: DC | PRN

## 2013-07-13 MED ORDER — ONDANSETRON HCL 4 MG PO TABS: 4.0000 mg | ORAL_TABLET | Freq: Four times a day (QID) | ORAL | Status: DC | PRN

## 2013-07-13 MED ORDER — MORPHINE SULFATE 2 MG/ML IJ SOLN
2.0000 mg | Freq: Once | INTRAMUSCULAR | Status: AC
  Administered 2013-07-13: 2 mg via INTRAVENOUS
  Filled 2013-07-13: qty 1

## 2013-07-13 MED ORDER — SODIUM CHLORIDE 0.9 % IJ SOLN: 3.0000 mL | INTRAMUSCULAR | Status: DC | PRN

## 2013-07-13 MED ORDER — DOCUSATE SODIUM 100 MG PO CAPS
100.0000 mg | ORAL_CAPSULE | Freq: Two times a day (BID) | ORAL | Status: DC
  Administered 2013-07-13: 100 mg via ORAL
  Filled 2013-07-13 (×4): qty 1

## 2013-07-13 MED ORDER — SODIUM CHLORIDE 0.9 % IV BOLUS (SEPSIS)
500.0000 mL | Freq: Once | INTRAVENOUS | Status: AC
  Administered 2013-07-13: 500 mL via INTRAVENOUS

## 2013-07-13 MED ORDER — INFLUENZA VAC SPLIT QUAD 0.5 ML IM SUSP
0.5000 mL | INTRAMUSCULAR | Status: AC
  Administered 2013-07-14: 0.5 mL via INTRAMUSCULAR
  Filled 2013-07-13 (×2): qty 0.5

## 2013-07-13 MED ORDER — ONDANSETRON HCL 4 MG/2ML IJ SOLN: 4.0000 mg | Freq: Four times a day (QID) | INTRAMUSCULAR | Status: DC | PRN

## 2013-07-13 MED ORDER — OXYCODONE HCL 5 MG PO TABS: 5.0000 mg | ORAL_TABLET | ORAL | Status: DC | PRN

## 2013-07-13 MED ORDER — HEPARIN (PORCINE) IN NACL 100-0.45 UNIT/ML-% IJ SOLN
2000.0000 [IU]/h | INTRAMUSCULAR | Status: AC
  Administered 2013-07-13 – 2013-07-14 (×2): 1800 [IU]/h via INTRAVENOUS
  Filled 2013-07-13 (×3): qty 250

## 2013-07-13 MED ORDER — VERAPAMIL HCL ER 240 MG PO TBCR
240.0000 mg | EXTENDED_RELEASE_TABLET | Freq: Every day | ORAL | Status: DC
  Administered 2013-07-14: 240 mg via ORAL
  Filled 2013-07-13: qty 1

## 2013-07-13 MED ORDER — SODIUM CHLORIDE 0.9 % IJ SOLN: 3.0000 mL | Freq: Two times a day (BID) | INTRAMUSCULAR | Status: DC

## 2013-07-13 MED ORDER — SODIUM CHLORIDE 0.9 % IV SOLN: 250.0000 mL | INTRAVENOUS | Status: DC | PRN

## 2013-07-13 MED ORDER — IOHEXOL 300 MG/ML  SOLN
50.0000 mL | Freq: Once | INTRAMUSCULAR | Status: AC | PRN
  Administered 2013-07-13: 50 mL via ORAL

## 2013-07-13 MED ORDER — HEPARIN BOLUS VIA INFUSION
5500.0000 [IU] | Freq: Once | INTRAVENOUS | Status: AC
  Administered 2013-07-13: 5500 [IU] via INTRAVENOUS
  Filled 2013-07-13: qty 5500

## 2013-07-13 MED ORDER — MORPHINE SULFATE 2 MG/ML IJ SOLN
2.0000 mg | INTRAMUSCULAR | Status: DC | PRN
  Administered 2013-07-13 (×3): 2 mg via INTRAVENOUS
  Filled 2013-07-13 (×3): qty 1

## 2013-07-13 NOTE — Progress Notes (Signed)
Pharmacy: Brief anticoagulation note  62 yo morbidly obese male presented 11/19 with c/o abd pain and SOB. CT angio/chest showed bilateral segmental and subsegmental pulmonary emboli with mild right ventricular strain pattern.  On IV heparin   Heparin level this evening is therapeutic (0.3) No bleeding/issues reported BLE duplex negative for DVT  Plan 1.) Continue IV heparin at 1800 units/hr 2.) Repeat HL at 2100; f/u Daily Heparin Level  3.) Daily CBC  Franklin Hicks, Loma Messing PharmD Pager #: [phone] 6:06 PM 07/13/2013

## 2013-07-13 NOTE — ED Notes (Signed)
Pt transported to CT ?

## 2013-07-13 NOTE — Progress Notes (Signed)
Bilateral lower extremity venous duplex:  No evidence of DVT, superficial thrombosis, or Baker's Cyst.   

## 2013-07-13 NOTE — Progress Notes (Signed)
UR completed 

## 2013-07-13 NOTE — ED Provider Notes (Signed)
Pt left with me at change of shift to check [place] RUQ study and for further evaluation. Patient reports 2 days ago he started having a dull achy pain in his right upper quadrant/right lower chest area that hurts with deep breathing with a sharp pain. The pain does not radiate. He denies cough, fever, pain or swelling in his extremities, nausea or vomiting. He states he has dyspnea on exertion. We discussed the results of his ultrasound and need to do further testing to rule out PE, other GI etiologies for his pain. Patient is agreeable.  PCP Dr Terrilee Files  Patient is morbidly obese, he's noted to have tenderness in his right lower chest wall/right upper quadrant. He's noted to have mild swelling bilateral legs without pain. Heart rate is 101 with pulse ox 96% on nasal cannula oxygen.  09:21 Dr Pecolia Ades, Radiology, called CT results, + bilateral PE's.  IV heparin ordered, patient and wife given test results and need for admission. They relate they had a prolonged car trip in October which may be the precipitating event for his PE. Pt also states he had swelling of his upper lip 4 days ago, he denies being on BP meds to me but reviewing his medication list he is on accupril, so he most likely is having an ACEI angioedema.   09:44 Dr Barnie Del, admit to tele, team 8  Ct Angio Chest W/cm &/or Wo Cm  07/13/2013   CLINICAL DATA:  Right-sided chest pain, shortness of breath and tachycardia.  EXAM: CT ANGIOGRAPHY CHEST  CT ABDOMEN AND PELVIS WITH CONTRAST  TECHNIQUE: Multidetector CT imaging of the chest was performed using the standard protocol during bolus administration of intravenous contrast. Multiplanar CT image reconstructions including MIPs were obtained to evaluate the vascular anatomy. Multidetector CT imaging of the abdomen and pelvis was performed using the standard protocol during bolus administration of intravenous contrast.  CONTRAST:  50mL OMNIPAQUE IOHEXOL 300 MG/ML SOLN, OMNIPAQUE IOHEXOL  350 MG/ML SOLN  COMPARISON:  07/21/2006  FINDINGS: CTA CHEST FINDINGS  The chest wall is unremarkable. No supraclavicular or axillary mass or adenopathy. The bony thorax is intact. No destructive bone lesions or spinal canal compromise. Moderate degenerative changes involving the thoracic spine and scattered hemangiomas.  The heart is upper limits of normal in size. No pericardial effusion. No mediastinal or hilar mass or adenopathy. The esophagus is grossly normal. The aorta is normal in caliber. No dissection.  The pulmonary arterial tree is fairly well opacified. Moderate breathing motion artifact. There are bilateral segmental and subsegmental pulmonary emboli. Mild right ventricular strain pattern with flattening of the left ventricle. The RV/LV ratio is 0.9  Examination of the lung parenchyma demonstrates significant breathing motion artifact. Bibasilar atelectasis and small right pleural effusion or noted. Moderate eventration of the right hemidiaphragm. No worrisome pulmonary lesions or pneumothorax.  The upper abdomen is grossly normal.  CT ABDOMEN and PELVIS FINDINGS  The liver is unremarkable. No focal hepatic lesions or biliary dilatation. The gallbladder is normal. No common bowel duct dilatation. The pancreas is normal. The spleen is normal. The adrenal glands and kidneys are normal except for bilateral renal cysts and right-sided parapelvic cysts.  The stomach, duodenum, small bowel and colon are unremarkable. No inflammatory changes, mass lesions or obstructive findings. The appendix is normal. No mesenteric or retroperitoneal mass or adenopathy. Moderate atherosclerotic calcifications involving the aorta and branch vessels. The portal, hepatic, splenic and renal veins are patent.  The bladder, prostate gland and seminal vesicles  are unremarkable. No pelvic mass, adenopathy or free pelvic fluid collections. Borderline enlarged obturator lymph node on the left side is unchanged since 2007. No inguinal  adenopathy.  The bony structures are intact. Stable advanced degenerative changes involving the lumbar spine L4-5 interbody fusion and posterior lateral fusion.  Review of the MIP images confirms the above findings.  IMPRESSION: 1. Bilateral segmental and subsegmental pulmonary emboli with mild right ventricular strain pattern. 2. Bibasilar atelectasis and small right effusion. 3. Normal thoracic aorta. 4. Multinodular thyroid goiter. 5. No acute abdominal/ pelvic findings, mass lesions or adenopathy. Critical Value/emergent results were called by telephone at the time of interpretation on 07/13/2013 at 9:21 AM to Dr.Keath Matera , who verbally acknowledged these results.   Electronically Signed   By: Loralie Champagne M.D.   On: 07/13/2013 09:21   US Abdomen Complete  07/13/2013   CLINICAL DATA:  Right upper quadrant abdominal pain.  EXAM: ULTRASOUND ABDOMEN COMPLETE  COMPARISON:  CT scan 07/21/2006.  FINDINGS: Gallbladder  No gallstones or wall thickening visualized. No sonographic Murphy sign noted.  Common bile duct  Diameter: 5.7 mm  Liver  Diffuse increased echogenicity with poor through transmission and poor definition of the liver architecture consistent with severe and diffuse fatty infiltration. No focal hepatic lesions or intrahepatic biliary dilatation.  IVC  Normal caliber.  Pancreas  Sonographically unremarkable.  Spleen  Normal size and echogenicity without focal lesions.  Right Kidney  Length: 13.2 cm. Echogenicity within normal limits. No mass or hydronephrosis visualized.  Left Kidney  Length: 13.5 cm. Normal renal cortical thickness and echogenicity without hydronephrosis. A 2.8 cm cyst is noted. Suspect 8 mm midpole calculus.  Abdominal aorta  Normal caliber.  IMPRESSION: 1. Diffuse fatty infiltration of the liver. 2. Normal gallbladder and normal caliber common bile duct. 3. Left renal calculus and simple appearing cyst.   Electronically Signed   By: Loralie Champagne M.D.   On: 07/13/2013 07:27    Ct Abdomen Pelvis W Contrast  07/13/2013   CLINICAL DATA:  Right-sided chest pain, shortness of breath and tachycardia.  EXAM: CT ANGIOGRAPHY CHEST  CT ABDOMEN AND PELVIS WITH CONTRAST  TECHNIQUE: Multidetector CT imaging of the chest was performed using the standard protocol during bolus administration of intravenous contrast. Multiplanar CT image reconstructions including MIPs were obtained to evaluate the vascular anatomy. Multidetector CT imaging of the abdomen and pelvis was performed using the standard protocol during bolus administration of intravenous contrast.  CONTRAST:  50mL OMNIPAQUE IOHEXOL 300 MG/ML SOLN, OMNIPAQUE IOHEXOL 350 MG/ML SOLN  COMPARISON:  07/21/2006  FINDINGS: CTA CHEST FINDINGS  The chest wall is unremarkable. No supraclavicular or axillary mass or adenopathy. The bony thorax is intact. No destructive bone lesions or spinal canal compromise. Moderate degenerative changes involving the thoracic spine and scattered hemangiomas.  The heart is upper limits of normal in size. No pericardial effusion. No mediastinal or hilar mass or adenopathy. The esophagus is grossly normal. The aorta is normal in caliber. No dissection.  The pulmonary arterial tree is fairly well opacified. Moderate breathing motion artifact. There are bilateral segmental and subsegmental pulmonary emboli. Mild right ventricular strain pattern with flattening of the left ventricle. The RV/LV ratio is 0.9  Examination of the lung parenchyma demonstrates significant breathing motion artifact. Bibasilar atelectasis and small right pleural effusion or noted. Moderate eventration of the right hemidiaphragm. No worrisome pulmonary lesions or pneumothorax.  The upper abdomen is grossly normal.  CT ABDOMEN and PELVIS FINDINGS  The liver  is unremarkable. No focal hepatic lesions or biliary dilatation. The gallbladder is normal. No common bowel duct dilatation. The pancreas is normal. The spleen is normal. The adrenal  glands and kidneys are normal except for bilateral renal cysts and right-sided parapelvic cysts.  The stomach, duodenum, small bowel and colon are unremarkable. No inflammatory changes, mass lesions or obstructive findings. The appendix is normal. No mesenteric or retroperitoneal mass or adenopathy. Moderate atherosclerotic calcifications involving the aorta and branch vessels. The portal, hepatic, splenic and renal veins are patent.  The bladder, prostate gland and seminal vesicles are unremarkable. No pelvic mass, adenopathy or free pelvic fluid collections. Borderline enlarged obturator lymph node on the left side is unchanged since 2007. No inguinal adenopathy.  The bony structures are intact. Stable advanced degenerative changes involving the lumbar spine L4-5 interbody fusion and posterior lateral fusion.  Review of the MIP images confirms the above findings.  IMPRESSION: 1. Bilateral segmental and subsegmental pulmonary emboli with mild right ventricular strain pattern. 2. Bibasilar atelectasis and small right effusion. 3. Normal thoracic aorta. 4. Multinodular thyroid goiter. 5. No acute abdominal/ pelvic findings, mass lesions or adenopathy. Critical Value/emergent results were called by telephone at the time of interpretation on 07/13/2013 at 9:21 AM to Dr.Latorria Zeoli , who verbally acknowledged these results.   Electronically Signed   By: Loralie Champagne M.D.   On: 07/13/2013 09:21   Dg Chest Port 1 View  07/13/2013   CLINICAL DATA:  New onset shortness of breath.  EXAM: PORTABLE CHEST - 1 VIEW  COMPARISON:  Chest radiograph May 27, 2006  FINDINGS: Cardiomediastinal silhouette is unremarkable for this low inspiratory portable examination with crowded central engorged vasculature markings. The lungs are clear without pleural effusions or focal consolidations. Trachea projects midline and there is no pneumothorax. Included soft tissue planes and osseous structures are non-suspicious. Multiple EKG lines  overlie the patient and may obscure subtle underlying pathology.  IMPRESSION: Central pulmonary vasculature congestion may be accentuated by low inspiratory examination.   Electronically Signed   By: Awilda Metro   On: 07/13/2013 05:18     Final diagnoses:  Pulmonary emboli  Dyspnea  Right-sided chest wall pain  Angioedema of lips, subsequent encounter   Plan admission   Devoria Albe, MD, Franz Dell, MD 07/13/13 [phone]

## 2013-07-13 NOTE — Progress Notes (Signed)
Pt complains of left chest pain.  Rates pain 5/10.  BP 159/67, HR 102.  Adm Morphine 2 mg IV.  MD notified.

## 2013-07-13 NOTE — ED Notes (Signed)
US at bedside

## 2013-07-13 NOTE — Progress Notes (Signed)
Pharmacy: Brief anticoagulation note  62 yo morbidly obese male presented 11/19 with c/o abd pain and SOB. CT angio/chest showed bilateral segmental and subsegmental pulmonary emboli with mild right ventricular strain pattern.  On IV heparin   Assessement:  Heparin level this evening is therapeutic x2  (0.3 and 0.32)  No bleeding/IV issues reported  BLE duplex negative for DVT  Plan  Continue IV heparin at 1800 units/hr   F/U Daily Heparin Level   Daily CBC  Lorenza Evangelist PharmD 11:07 PM 07/13/2013

## 2013-07-13 NOTE — ED Notes (Signed)
Pt c/o increasing pain right lower quad x 1 day; no nausea or vomiting; shortness of breath; no fever; sats 88% in triage

## 2013-07-13 NOTE — ED Notes (Signed)
Report given to [facility] on 4W.

## 2013-07-13 NOTE — Progress Notes (Signed)
ANTICOAGULATION CONSULT NOTE - Initial Consult  Pharmacy Consult for IV Heparin Indication: pulmonary embolus  Allergies  Allergen Reactions  . Other Hives and Itching    msg    Patient Measurements: Height: 6' (182.9 cm) Weight: 350 lb (158.759 kg) IBW/kg (Calculated) : 77.6 Heparin Dosing Weight: 115.5 kg  Labs:  Recent Labs  07/13/13 0456 07/13/13 0538  HGB 15.1  --   HCT 45.5  --   PLT 246  --   CREATININE  --  1.18  TROPONINI  --  <0.30    Estimated Creatinine Clearance: 98.5 ml/min (by C-G formula based on Cr of 1.18).   Medical History: Past Medical History  Diagnosis Date  . Gout   . Hypertension   . DJD (degenerative joint disease)   . Obesity     Assessment: 62 yo morbidly obese male presented 11/19 with c/o abd pain and SOB. CT angio/chest showed bilateral segmental and subsegmental pulmonary emboli with mild right ventricular strain pattern. IV heparin ordered.   Pt was not on any anticoagulant PTA. CBC wnl, no bleeding. Good renal function.  Goal of Therapy:  Heparin level 0.3-0.7 units/ml Monitor platelets by anticoagulation protocol: Yes   Plan:   STAT APTT and PT/INR  IV heparin 5500 units bolus x 1 then 1800 units/hr (~16 units/kg/hr)  Heparin level at 1600  Daily heparin level and CBC  Consider bridging to oral therapy quickly  Geoffry Paradise, PharmD, BCPS Pager: [phone] 9:35 AM Pharmacy #: [id number]

## 2013-07-13 NOTE — Progress Notes (Signed)
Called to get report from ED nurse for pt coming to room 1429.  Nurse not available at this time.

## 2013-07-13 NOTE — ED Notes (Signed)
Pt returned from X-ray.  

## 2013-07-13 NOTE — H&P (Signed)
[facility] History and Physical  Franklin Hicks ZOX:[id number] DOB: 12-30-1950 DOA: 07/13/2013   PCP: Carrie Mew, MD  Specialists: None  Chief Complaint: Right-sided chest pain or shortness of breath since Sunday  HPI: Franklin Hicks is a 62 y.o. male with a past medical history of hypertension, arthritis, BPH, who is morbidly obese, who was in his usual state of health till this past weekend when he started noticing a dull ache in the right side of his chest. He did not have any shortness of breath initially. However, subsequently, his breathing got worse. The chest pain, increased in intensity to become 10 out of 10. It also became sharp and worse with deep breathing. So since his chest pain and shortness of breath continued to get worse he decided to come in to the ED today for further evaluation. Currently, the pain is 7/10. There is no radiation of the pain and is located mostly in the right lower chest. He had some nausea earlier, but none right now. He's never had similar chest pains in the past. Never been diagnosed with blood clots in the past. He does have a history of leg swelling, but this is chronic. Denies any increase in the swelling in the last couple of weeks. He did take a long road trip to [place] and back in early October. 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 Denies any weight loss recently. This past Saturday he was attending a football game and he noticed that his upper lips had swollen up. He took some Benadryl with which the swelling resolved.   Home Medications: Prior to Admission medications   Medication Sig Start Date End Date Taking? Authorizing Provider  Ascorbic Acid (VITAMIN C) 100 MG tablet Take 100 mg by mouth daily.   Yes Historical Provider, MD  aspirin 81 MG tablet Take 81 mg by mouth daily.    Yes Historical Provider, MD  atorvastatin (LIPITOR) 40 MG tablet TAKE 1 TABLET (40 MG TOTAL) BY MOUTH DAILY.  05/04/13  Yes Stacie Glaze, MD  verapamil (CALAN-SR) 240 MG CR tablet Take 1 tablet (240 mg total) by mouth daily. 10/05/12  Yes Stacie Glaze, MD  vitamin B-12 (CYANOCOBALAMIN) 100 MCG tablet Take 100 mcg by mouth daily.   Yes Historical Provider, MD  ibuprofen (ADVIL,MOTRIN) 800 MG tablet Take 800 mg by mouth every 8 (eight) hours as needed. For pain    Historical Provider, MD    Allergies:  Allergies  Allergen Reactions  . Ace Inhibitors     Angioedema  . Other Hives and Itching    msg    Past Medical History: Past Medical History  Diagnosis Date  . Gout   . Hypertension   . DJD (degenerative joint disease)   . Obesity     Past Surgical History  Procedure Laterality Date  . US echocardiography  2004    no lvh nl ejection fraction  . Ureteral stone  [id number]  . Left knee open meniscetomy      Social History: Lives in [place] with his wife. He quit smoking in 1981. Used to smoke one pack of cigarettes on a daily basis. Has beer occasionally. Denies any illicit drug use. He usually independent in daily activities.  Family History:  Family History  Problem Relation Age of Onset  . Liver disease Mother      Review of Systems - History obtained from the patient General ROS: positive for  -  fatigue Psychological ROS: negative Ophthalmic ROS: negative ENT ROS: negative Allergy and Immunology ROS: negative Hematological and Lymphatic ROS: negative Endocrine ROS: negative Respiratory ROS: as in hpi Cardiovascular ROS: as in hpi Gastrointestinal ROS: no abdominal pain, change in bowel habits, or black or bloody stools Genito-Urinary ROS: no dysuria, trouble voiding, or hematuria Musculoskeletal ROS: negative Neurological ROS: no TIA or stroke symptoms Dermatological ROS: negative  Physical Examination  Filed Vitals:   07/13/13 0630 07/13/13 0714 07/13/13 0920 07/13/13 1123  BP:  142/86  154/84  Pulse: 100 100 98 102  Temp:    98.8 F (37.1 C)  TempSrc:     Oral  Resp: 24 20 25 20   Height:    6' (1.829 m)  Weight:    169.328 kg (373 lb 4.8 oz)  SpO2: 97% 95% 96%     General appearance: alert, cooperative, appears stated age, no distress and morbidly obese Head: Normocephalic, without obvious abnormality, atraumatic Eyes: conjunctivae/corneas clear. PERRL, EOM's intact.  Throat: lips, mucosa, and tongue normal; teeth and gums normal Neck: no adenopathy, no carotid bruit, no JVD, supple, symmetrical, trachea midline and thyroid not enlarged, symmetric, no tenderness/mass/nodules Resp: clear to auscultation bilaterally Cardio: regular rate and rhythm, S1, S2 normal, no murmur, click, rub or gallop GI: soft, non-tender; bowel sounds normal; no masses,  no organomegaly Extremities: edema 1 + bilateral LE. No calf tenderness. Pulses: 2+ and symmetric Skin: Skin color, texture, turgor normal. No rashes or lesions Lymph nodes: Cervical, supraclavicular, and axillary nodes normal. Neurologic: Alert and oriented x 3. No focal deficits  Laboratory Data: Results for orders placed during the hospital encounter of 07/13/13 (from the past 48 hour(s))  CBC WITH DIFFERENTIAL     Status: Abnormal   Collection Time    07/13/13  4:56 AM      Result Value Range   WBC 11.2 (*) 4.0 - 10.5 K/uL   RBC 5.12  4.22 - 5.81 MIL/uL   Hemoglobin 15.1  13.0 - 17.0 g/dL   HCT 96.0  45.4 - 09.8 %   MCV 88.9  78.0 - 100.0 fL   MCH 29.5  26.0 - 34.0 pg   MCHC 33.2  30.0 - 36.0 g/dL   RDW 11.9  14.7 - 82.9 %   Platelets 246  150 - 400 K/uL   Neutrophils Relative % 70  43 - 77 %   Neutro Abs 7.8 (*) 1.7 - 7.7 K/uL   Lymphocytes Relative 21  12 - 46 %   Lymphs Abs 2.3  0.7 - 4.0 K/uL   Monocytes Relative 8  3 - 12 %   Monocytes Absolute 0.9  0.1 - 1.0 K/uL   Eosinophils Relative 2  0 - 5 %   Eosinophils Absolute 0.2  0.0 - 0.7 K/uL   Basophils Relative 0  0 - 1 %   Basophils Absolute 0.0  0.0 - 0.1 K/uL  URINALYSIS, ROUTINE W REFLEX MICROSCOPIC     Status:  Abnormal   Collection Time    07/13/13  5:31 AM      Result Value Range   Color, Urine YELLOW  YELLOW   APPearance CLOUDY (*) CLEAR   Specific Gravity, Urine 1.030  1.005 - 1.030   pH 5.5  5.0 - 8.0   Glucose, UA NEGATIVE  NEGATIVE mg/dL   Hgb urine dipstick TRACE (*) NEGATIVE   Bilirubin Urine NEGATIVE  NEGATIVE   Ketones, ur NEGATIVE  NEGATIVE mg/dL   Protein, ur NEGATIVE  NEGATIVE  mg/dL   Urobilinogen, UA 0.2  0.0 - 1.0 mg/dL   Nitrite NEGATIVE  NEGATIVE   Leukocytes, UA NEGATIVE  NEGATIVE  URINE MICROSCOPIC-ADD ON     Status: None   Collection Time    07/13/13  5:31 AM      Result Value Range   Squamous Epithelial / LPF RARE  RARE   WBC, UA 0-2  <3 WBC/hpf   RBC / HPF 0-2  <3 RBC/hpf   Urine-Other MUCOUS PRESENT    PRO B NATRIURETIC PEPTIDE     Status: Abnormal   Collection Time    07/13/13  5:38 AM      Result Value Range   Pro B Natriuretic peptide (BNP) 274.4 (*) 0 - 125 pg/mL  COMPREHENSIVE METABOLIC PANEL     Status: Abnormal   Collection Time    07/13/13  5:38 AM      Result Value Range   Sodium 139  135 - 145 mEq/L   Potassium 4.1  3.5 - 5.1 mEq/L   Chloride 102  96 - 112 mEq/L   CO2 29  19 - 32 mEq/L   Glucose, Bld 130 (*) 70 - 99 mg/dL   BUN 19  6 - 23 mg/dL   Creatinine, Ser 7.82  0.50 - 1.35 mg/dL   Calcium 9.7  8.4 - 95.6 mg/dL   Total Protein 8.1  6.0 - 8.3 g/dL   Albumin 3.5  3.5 - 5.2 g/dL   AST 19  0 - 37 U/L   ALT 18  0 - 53 U/L   Alkaline Phosphatase 100  39 - 117 U/L   Total Bilirubin 0.4  0.3 - 1.2 mg/dL   GFR calc non Af Amer 63 (*) >90 mL/min   GFR calc Af Amer 74 (*) >90 mL/min   Comment: (NOTE)     The eGFR has been calculated using the CKD EPI equation.     This calculation has not been validated in all clinical situations.     eGFR's persistently <90 mL/min signify possible Chronic Kidney     Disease.  LIPASE, BLOOD     Status: Abnormal   Collection Time    07/13/13  5:38 AM      Result Value Range   Lipase 72 (*) 11 - 59 U/L   TROPONIN I     Status: None   Collection Time    07/13/13  5:38 AM      Result Value Range   Troponin I <0.30  <0.30 ng/mL   Comment:            Due to the release kinetics of cTnI,     a negative result within the first hours     of the onset of symptoms does not rule out     myocardial infarction with certainty.     If myocardial infarction is still suspected,     repeat the test at appropriate intervals.  APTT     Status: None   Collection Time    07/13/13 10:45 AM      Result Value Range   aPTT 31  24 - 37 seconds  PROTIME-INR     Status: None   Collection Time    07/13/13 10:45 AM      Result Value Range   Prothrombin Time 13.6  11.6 - 15.2 seconds   INR 1.06  0.00 - 1.49    Radiology Reports: Ct Angio Chest W/cm &/or Wo Cm  07/13/2013  CLINICAL DATA:  Right-sided chest pain, shortness of breath and tachycardia.  EXAM: CT ANGIOGRAPHY CHEST  CT ABDOMEN AND PELVIS WITH CONTRAST  TECHNIQUE: Multidetector CT imaging of the chest was performed using the standard protocol during bolus administration of intravenous contrast. Multiplanar CT image reconstructions including MIPs were obtained to evaluate the vascular anatomy. Multidetector CT imaging of the abdomen and pelvis was performed using the standard protocol during bolus administration of intravenous contrast.  CONTRAST:  50mL OMNIPAQUE IOHEXOL 300 MG/ML SOLN, OMNIPAQUE IOHEXOL 350 MG/ML SOLN  COMPARISON:  07/21/2006  FINDINGS: CTA CHEST FINDINGS  The chest wall is unremarkable. No supraclavicular or axillary mass or adenopathy. The bony thorax is intact. No destructive bone lesions or spinal canal compromise. Moderate degenerative changes involving the thoracic spine and scattered hemangiomas.  The heart is upper limits of normal in size. No pericardial effusion. No mediastinal or hilar mass or adenopathy. The esophagus is grossly normal. The aorta is normal in caliber. No dissection.  The pulmonary arterial tree is fairly  well opacified. Moderate breathing motion artifact. There are bilateral segmental and subsegmental pulmonary emboli. Mild right ventricular strain pattern with flattening of the left ventricle. The RV/LV ratio is 0.9  Examination of the lung parenchyma demonstrates significant breathing motion artifact. Bibasilar atelectasis and small right pleural effusion or noted. Moderate eventration of the right hemidiaphragm. No worrisome pulmonary lesions or pneumothorax.  The upper abdomen is grossly normal.  CT ABDOMEN and PELVIS FINDINGS  The liver is unremarkable. No focal hepatic lesions or biliary dilatation. The gallbladder is normal. No common bowel duct dilatation. The pancreas is normal. The spleen is normal. The adrenal glands and kidneys are normal except for bilateral renal cysts and right-sided parapelvic cysts.  The stomach, duodenum, small bowel and colon are unremarkable. No inflammatory changes, mass lesions or obstructive findings. The appendix is normal. No mesenteric or retroperitoneal mass or adenopathy. Moderate atherosclerotic calcifications involving the aorta and branch vessels. The portal, hepatic, splenic and renal veins are patent.  The bladder, prostate gland and seminal vesicles are unremarkable. No pelvic mass, adenopathy or free pelvic fluid collections. Borderline enlarged obturator lymph node on the left side is unchanged since 2007. No inguinal adenopathy.  The bony structures are intact. Stable advanced degenerative changes involving the lumbar spine L4-5 interbody fusion and posterior lateral fusion.  Review of the MIP images confirms the above findings.  IMPRESSION: 1. Bilateral segmental and subsegmental pulmonary emboli with mild right ventricular strain pattern. 2. Bibasilar atelectasis and small right effusion. 3. Normal thoracic aorta. 4. Multinodular thyroid goiter. 5. No acute abdominal/ pelvic findings, mass lesions or adenopathy. Critical Value/emergent results were called by  telephone at the time of interpretation on 07/13/2013 at 9:21 AM to Dr.IVA KNAPP , who verbally acknowledged these results.   Electronically Signed   By: Loralie Champagne M.D.   On: 07/13/2013 09:21   US Abdomen Complete  07/13/2013   CLINICAL DATA:  Right upper quadrant abdominal pain.  EXAM: ULTRASOUND ABDOMEN COMPLETE  COMPARISON:  CT scan 07/21/2006.  FINDINGS: Gallbladder  No gallstones or wall thickening visualized. No sonographic Murphy sign noted.  Common bile duct  Diameter: 5.7 mm  Liver  Diffuse increased echogenicity with poor through transmission and poor definition of the liver architecture consistent with severe and diffuse fatty infiltration. No focal hepatic lesions or intrahepatic biliary dilatation.  IVC  Normal caliber.  Pancreas  Sonographically unremarkable.  Spleen  Normal size and echogenicity without focal lesions.  Right Kidney  Length: 13.2 cm. Echogenicity within normal limits. No mass or hydronephrosis visualized.  Left Kidney  Length: 13.5 cm. Normal renal cortical thickness and echogenicity without hydronephrosis. A 2.8 cm cyst is noted. Suspect 8 mm midpole calculus.  Abdominal aorta  Normal caliber.  IMPRESSION: 1. Diffuse fatty infiltration of the liver. 2. Normal gallbladder and normal caliber common bile duct. 3. Left renal calculus and simple appearing cyst.   Electronically Signed   By: Loralie Champagne M.D.   On: 07/13/2013 07:27   Ct Abdomen Pelvis W Contrast  07/13/2013   CLINICAL DATA:  Right-sided chest pain, shortness of breath and tachycardia.  EXAM: CT ANGIOGRAPHY CHEST  CT ABDOMEN AND PELVIS WITH CONTRAST  TECHNIQUE: Multidetector CT imaging of the chest was performed using the standard protocol during bolus administration of intravenous contrast. Multiplanar CT image reconstructions including MIPs were obtained to evaluate the vascular anatomy. Multidetector CT imaging of the abdomen and pelvis was performed using the standard protocol during bolus administration  of intravenous contrast.  CONTRAST:  50mL OMNIPAQUE IOHEXOL 300 MG/ML SOLN, OMNIPAQUE IOHEXOL 350 MG/ML SOLN  COMPARISON:  07/21/2006  FINDINGS: CTA CHEST FINDINGS  The chest wall is unremarkable. No supraclavicular or axillary mass or adenopathy. The bony thorax is intact. No destructive bone lesions or spinal canal compromise. Moderate degenerative changes involving the thoracic spine and scattered hemangiomas.  The heart is upper limits of normal in size. No pericardial effusion. No mediastinal or hilar mass or adenopathy. The esophagus is grossly normal. The aorta is normal in caliber. No dissection.  The pulmonary arterial tree is fairly well opacified. Moderate breathing motion artifact. There are bilateral segmental and subsegmental pulmonary emboli. Mild right ventricular strain pattern with flattening of the left ventricle. The RV/LV ratio is 0.9  Examination of the lung parenchyma demonstrates significant breathing motion artifact. Bibasilar atelectasis and small right pleural effusion or noted. Moderate eventration of the right hemidiaphragm. No worrisome pulmonary lesions or pneumothorax.  The upper abdomen is grossly normal.  CT ABDOMEN and PELVIS FINDINGS  The liver is unremarkable. No focal hepatic lesions or biliary dilatation. The gallbladder is normal. No common bowel duct dilatation. The pancreas is normal. The spleen is normal. The adrenal glands and kidneys are normal except for bilateral renal cysts and right-sided parapelvic cysts.  The stomach, duodenum, small bowel and colon are unremarkable. No inflammatory changes, mass lesions or obstructive findings. The appendix is normal. No mesenteric or retroperitoneal mass or adenopathy. Moderate atherosclerotic calcifications involving the aorta and branch vessels. The portal, hepatic, splenic and renal veins are patent.  The bladder, prostate gland and seminal vesicles are unremarkable. No pelvic mass, adenopathy or free pelvic fluid  collections. Borderline enlarged obturator lymph node on the left side is unchanged since 2007. No inguinal adenopathy.  The bony structures are intact. Stable advanced degenerative changes involving the lumbar spine L4-5 interbody fusion and posterior lateral fusion.  Review of the MIP images confirms the above findings.  IMPRESSION: 1. Bilateral segmental and subsegmental pulmonary emboli with mild right ventricular strain pattern. 2. Bibasilar atelectasis and small right effusion. 3. Normal thoracic aorta. 4. Multinodular thyroid goiter. 5. No acute abdominal/ pelvic findings, mass lesions or adenopathy. Critical Value/emergent results were called by telephone at the time of interpretation on 07/13/2013 at 9:21 AM to Dr.IVA KNAPP , who verbally acknowledged these results.   Electronically Signed   By: Loralie Champagne M.D.   On: 07/13/2013 09:21   Dg Chest Port 1 View  07/13/2013  CLINICAL DATA:  New onset shortness of breath.  EXAM: PORTABLE CHEST - 1 VIEW  COMPARISON:  Chest radiograph May 27, 2006  FINDINGS: Cardiomediastinal silhouette is unremarkable for this low inspiratory portable examination with crowded central engorged vasculature markings. The lungs are clear without pleural effusions or focal consolidations. Trachea projects midline and there is no pneumothorax. Included soft tissue planes and osseous structures are non-suspicious. Multiple EKG lines overlie the patient and may obscure subtle underlying pathology.  IMPRESSION: Central pulmonary vasculature congestion may be accentuated by low inspiratory examination.   Electronically Signed   By: Awilda Metro   On: 07/13/2013 05:18    Electrocardiogram: Sinus tachycardia at 111 beats per minute. Normal axis. Borderline prolonged QT interval. Possible strain pattern in V5, V6. Could be rate related. No other acute changes are noted.  Problem List  Principal Problem:   Acute pulmonary embolism Active Problems:   GOUT   OBESITY    HYPERTENSION   Angioedema of lips   Multinodular goiter   Assessment: This is a 62 year old, African American male, who presents with right-sided chest pain, and shortness of breath, and is found to have acute pulmonary embolism. Reason for his presentation is not entirely clear. He does have risk factors including obesity. 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 He also had a long road trip to [place] in October. Other hypercoagulable reasons cannot be ruled out entirely. He was also incidentally noted to have multinodular goiter. He appeared to have had angioedema over this weekend and is noted to be on ACE inhibitor. Reason for elevated Lipase is not clear.  Plan: #1 acute pulmonary embolism with mild RV strain: He is on heparin infusion. We will find out if he can be on one of the newer anticoagulants. Pain control with narcotics. Will get a Doppler scan of the lower extremities. Hypercoagulable panel will be sent off. EKG will be repeated  #2 possible angioedema over the weekend: Etiology remains unclear. Since he is on ACE inhibitor, this will be discontinued for now.  #3 multinodular goiter: Work up as an outpatient. Check TSH.  #4 history of hypertension: Continue with his verapamil from tomorrow.  #5 morbid obesity: He'll require weight loss counseling at some point.  #6 history of osteoarthritis and gout: Stable.  #7 history of BPH: He takes saw palmetto. I've told him that now that he will be on anticoagulants he cannot take this herbal supplement anymore. Will also recommend stopping other herbal supplements.   DVT Prophylaxis: She is on IV heparin Code Status: Full code Family Communication: Discussed with the patient, his wife and his daughter  Disposition Plan: Admitted to telemetry   Further management decisions will depend on results of further testing and patient's response to treatment.  [facility]  [facility] Pager  [phone]  If 7PM-7AM, please contact night-coverage [email or web address] Password [email or web address]  07/13/2013, 1:23 PM

## 2013-07-13 NOTE — ED Provider Notes (Signed)
CSN: [id number]     Arrival date & time 07/13/13  0436 History   First MD Initiated Contact with Patient 07/13/13 0441     Chief Complaint  Patient presents with  . Abdominal Pain   (Consider location/radiation/quality/duration/timing/severity/associated sxs/prior Treatment) HPI Patient presents with increasing right upper quadrant pain for the past 24 hours. He's had progressive shortness of breath and pain with deep inspiration. He denies any nausea, vomiting, diarrhea, fever, cough. He states he has had chills. Patient has had no urinary changes. Past Medical History  Diagnosis Date  . Gout   . Hypertension   . DJD (degenerative joint disease)   . Obesity    Past Surgical History  Procedure Laterality Date  . US echocardiography  2004    no lvh nl ejection fraction  . Ureteral stone  [id number]  . Left knee open meniscetomy     Family History  Problem Relation Age of Onset  . Liver disease Mother    History  Substance Use Topics  . Smoking status: Former Games developer  . Smokeless tobacco: Not on file  . Alcohol Use: No    Review of Systems  Constitutional: Positive for chills. Negative for fever.  Respiratory: Positive for shortness of breath. Negative for cough.   Cardiovascular: Negative for chest pain.  Gastrointestinal: Positive for abdominal pain. Negative for nausea, vomiting, diarrhea and constipation.  Genitourinary: Negative for dysuria, frequency, hematuria, flank pain and difficulty urinating.  Musculoskeletal: Negative for back pain and myalgias.  Skin: Negative for rash and wound.  Neurological: Negative for dizziness, weakness, light-headedness, numbness and headaches.  All other systems reviewed and are negative.    Allergies  Review of patient's allergies indicates no known allergies.  Home Medications   Current Outpatient Rx  Name  Route  Sig  Dispense  Refill  . aspirin 81 MG tablet   Oral   Take 81 mg by mouth daily.          Marland Kitchen atorvastatin  (LIPITOR) 40 MG tablet      TAKE 1 TABLET (40 MG TOTAL) BY MOUTH DAILY.   90 tablet   3   . ibuprofen (ADVIL,MOTRIN) 800 MG tablet   Oral   Take 800 mg by mouth every 8 (eight) hours as needed.         . quinapril (ACCUPRIL) 40 MG tablet   Oral   Take 1 tablet (40 mg total) by mouth daily.   90 tablet   3   . Saw Palmetto 1000 MG CAPS   Oral   Take by mouth daily.         . verapamil (CALAN-SR) 240 MG CR tablet   Oral   Take 1 tablet (240 mg total) by mouth daily.   90 tablet   3    BP 162/89  Pulse 110  Temp(Src) 98.6 F (37 C) (Oral)  Resp 22  Ht 6' (1.829 m)  Wt 350 lb (158.759 kg)  BMI 47.46 kg/m2  SpO2 96% Physical Exam  Nursing note and vitals reviewed. Constitutional: He is oriented to person, place, and time. He appears well-developed and well-nourished. No distress.  Obese  HENT:  Head: Normocephalic and atraumatic.  Mouth/Throat: Oropharynx is clear and moist. No oropharyngeal exudate.  Eyes: EOM are normal. Pupils are equal, round, and reactive to light.  Neck: Normal range of motion. Neck supple.  Cardiovascular: Normal rate and regular rhythm.  Exam reveals no gallop and no friction rub.   No murmur heard. Pulmonary/Chest:  Effort normal and breath sounds normal. No respiratory distress. He has no wheezes. He has no rales. He exhibits no tenderness.  Abdominal: Soft. Bowel sounds are normal. He exhibits no distension and no mass. There is tenderness (tender palpation over the right upper quadrant.). There is no rebound and no guarding.  Musculoskeletal: Normal range of motion. He exhibits no edema and no tenderness.  Bilateral lower extremity edema which appears due to  chronic venous stasis.  Neurological: He is alert and oriented to person, place, and time.  Moves all extremities without deficit. Sensation grossly intact.  Skin: Skin is warm and dry. No rash noted. No erythema.  Psychiatric: He has a normal mood and affect. His behavior is  normal.    ED Course  Procedures (including critical care time) Labs Review Labs Reviewed  CBC WITH DIFFERENTIAL  COMPREHENSIVE METABOLIC PANEL  LIPASE, BLOOD  TROPONIN I  PRO B NATRIURETIC PEPTIDE  URINALYSIS, ROUTINE W REFLEX MICROSCOPIC   Imaging Review No results found.    MDM   Patient has no acute findings chest x-ray and account for her symptoms. He is pending a right upper quadrant ultrasound for possible cholecystitis. Signed out to oncoming emergency physician pending imaging and disposition.   Loren Racer, MD 07/13/13 [phone]

## 2013-07-13 NOTE — ED Notes (Signed)
Unsuccessful IV attempt.  Asked Consulting civil engineer to attempt.

## 2013-07-14 LAB — COMPREHENSIVE METABOLIC PANEL
ALT: 14 U/L (ref 0–53)
AST: 15 U/L (ref 0–37)
Albumin: 2.9 g/dL — ABNORMAL LOW (ref 3.5–5.2)
BUN: 13 mg/dL (ref 6–23)
CO2: 25 mEq/L (ref 19–32)
Calcium: 8.6 mg/dL (ref 8.4–10.5)
Chloride: 95 mEq/L — ABNORMAL LOW (ref 96–112)
Creatinine, Ser: 1.12 mg/dL (ref 0.50–1.35)
GFR calc non Af Amer: 68 mL/min — ABNORMAL LOW (ref >90)
Glucose, Bld: 168 mg/dL — ABNORMAL HIGH (ref 70–99)

## 2013-07-14 LAB — BETA-2-GLYCOPROTEIN I ABS, IGG/M/A
Beta-2 Glyco I IgG: 2 G Units (ref <20)
Beta-2-Glycoprotein I IgA: 34 A Units — ABNORMAL HIGH (ref <20)

## 2013-07-14 LAB — PROTHROMBIN GENE MUTATION

## 2013-07-14 LAB — HOMOCYSTEINE: Homocysteine: 9.8 umol/L (ref 4.0–15.4)

## 2013-07-14 LAB — LIPASE, BLOOD: Lipase: 14 U/L (ref 11–59)

## 2013-07-14 LAB — CBC
HCT: 43.4 % (ref 39.0–52.0)
MCH: 29.9 pg (ref 26.0–34.0)
MCV: 90.2 fL (ref 78.0–100.0)
Platelets: 182 10*3/uL (ref 150–400)
RDW: 14.2 % (ref 11.5–15.5)

## 2013-07-14 LAB — CARDIOLIPIN ANTIBODIES, IGG, IGM, IGA
Anticardiolipin IgA: 4 APL U/mL — ABNORMAL LOW (ref <22)
Anticardiolipin IgG: 9 GPL U/mL — ABNORMAL LOW (ref <23)
Anticardiolipin IgM: 1 MPL U/mL — ABNORMAL LOW (ref <11)

## 2013-07-14 LAB — FACTOR 5 LEIDEN

## 2013-07-14 MED ORDER — RIVAROXABAN 15 MG PO TABS
15.0000 mg | ORAL_TABLET | Freq: Two times a day (BID) | ORAL | Status: DC
  Administered 2013-07-14: 15 mg via ORAL
  Filled 2013-07-14 (×3): qty 1

## 2013-07-14 MED ORDER — OXYCODONE HCL 5 MG PO TABS: 5.0000 mg | ORAL_TABLET | ORAL | Status: DC | PRN

## 2013-07-14 MED ORDER — RIVAROXABAN 20 MG PO TABS: ORAL_TABLET | ORAL | Status: DC

## 2013-07-14 MED ORDER — RIVAROXABAN 15 MG PO TABS: 15.0000 mg | ORAL_TABLET | Freq: Two times a day (BID) | ORAL | Status: DC

## 2013-07-14 MED ORDER — RIVAROXABAN (XARELTO) EDUCATION KIT FOR DVT/PE PATIENTS
PACK | Freq: Once | Status: AC
  Administered 2013-07-14: 11:00:00
  Filled 2013-07-14: qty 1

## 2013-07-14 MED ORDER — RIVAROXABAN (XARELTO) VTE STARTER PACK (15 & 20 MG): ORAL_TABLET | ORAL | Status: DC

## 2013-07-14 NOTE — Progress Notes (Signed)
ANTICOAGULATION CONSULT NOTE - F/U Consult   Pharmacy Consult for IV Heparin Indication: pulmonary embolus  Allergies  Allergen Reactions  . Ace Inhibitors     Angioedema  . Other Hives and Itching    msg    Patient Measurements: Height: 6' (182.9 cm) Weight: 373 lb 4.8 oz (169.328 kg) IBW/kg (Calculated) : 77.6 Heparin Dosing Weight: 115.5 kg  Labs:  Recent Labs  07/13/13 0456 07/13/13 0538 07/13/13 1045 07/13/13 1539 07/13/13 2150 07/14/13 0456  HGB 15.1  --   --   --   --  14.4  HCT 45.5  --   --   --   --  43.4  PLT 246  --   --   --   --  182  APTT  --   --  31  --   --   --   LABPROT  --   --  13.6  --   --   --   INR  --   --  1.06  --   --   --   HEPARINUNFRC  --   --   --  0.30 0.32 0.28*  CREATININE  --  1.18  --   --   --  1.12  TROPONINI  --  <0.30  --   --   --   --     Estimated Creatinine Clearance: 107.7 ml/min (by C-G formula based on Cr of 1.12).   Medical History: Past Medical History  Diagnosis Date  . Gout   . Hypertension   . DJD (degenerative joint disease)   . Obesity     Assessment: 62 yo morbidly obese male presented 11/19 with c/o abd pain and SOB. CT angio/chest showed bilateral segmental and subsegmental pulmonary emboli with mild right ventricular strain pattern. IV heparin ordered.   Pt was not on any anticoagulant PTA. CBC wnl, no bleeding. Good renal function.  Pt with therapeutic HL x2 (0.3 and 0.32)  No bleeding/Infusion stopped <10 min ~0000 for bag change per RN  Goal of Therapy:  Heparin level 0.3-0.7 units/ml Monitor platelets by anticoagulation protocol: Yes   Plan:   Increase heparin drip to 2000 units/hr  Recheck HL @ noon  Daily heparin level and CBC   Lorenza Evangelist 07/14/2013 6:24 AM

## 2013-07-14 NOTE — Progress Notes (Signed)
Pt ambulated on room air.  O2 sats 93-94% on RA with minor dyspnea upon ambulation.  Ardyth Gal, RN 07/14/2013

## 2013-07-14 NOTE — Progress Notes (Signed)
ANTICOAGULATION CONSULT NOTE - Initial Consult  Pharmacy Consult for Xarelto Indication: pulmonary embolus  Allergies  Allergen Reactions  . Ace Inhibitors     Angioedema  . Other Hives and Itching    msg    Patient Measurements: Height: 6' (182.9 cm) Weight: 373 lb 4.8 oz (169.328 kg) IBW/kg (Calculated) : 77.6  Vital Signs: Temp: 98.8 F (37.1 C) (11/20 0520) Temp src: Oral (11/20 0520) BP: 158/89 mmHg (11/20 0520) Pulse Rate: 111 (11/20 0520)  Labs:  Recent Labs  07/13/13 0456 07/13/13 0538 07/13/13 1045 07/13/13 1539 07/13/13 2150 07/14/13 0456  HGB 15.1  --   --   --   --  14.4  HCT 45.5  --   --   --   --  43.4  PLT 246  --   --   --   --  182  APTT  --   --  31  --   --   --   LABPROT  --   --  13.6  --   --   --   INR  --   --  1.06  --   --   --   HEPARINUNFRC  --   --   --  0.30 0.32 0.28*  CREATININE  --  1.18  --   --   --  1.12  TROPONINI  --  <0.30  --   --   --   --     Estimated Creatinine Clearance: 107.7 ml/min (by C-G formula based on Cr of 1.12).   Medical History: Past Medical History  Diagnosis Date  . Gout   . Hypertension   . DJD (degenerative joint disease)   . Obesity       Assessment: 72 yoM with new bilateral PE.  Patient was started on IV heparin last night and will be transitioned to Xarelto today.  CrCl>100 ml/min  CBC WNL  Goal of Therapy:  Treatment of PE Monitor platelets by anticoagulation protocol: Yes   Plan:   Start Xarelto 15 mg BID today.  Will get first dose in hospital and then will continue treatment outpatient.  Stop heparin infusion at same time as first dose of Xarelto.  Continue 15 mg BID with meals until 08/03/13 then start 20 mg daily with meals on 08/04/13.  Provided patient with Xarelto discharge kit with 30 day free prescription card.  Counseled patient on use and safety of Xarelto.  Patient verbalized understanding.  Clance Boll 07/14/2013,11:03 AM

## 2013-07-14 NOTE — Progress Notes (Signed)
Pt has Xarelto coverage with a Co pay of $45.00. [facility] gave [facility] a 30 day day trial free Xarelto card.

## 2013-07-14 NOTE — Discharge Summary (Signed)
[facility]  Physician Discharge Summary   Patient ID: Franklin Hicks MRN: [id number] DOB/AGE: [birth year] 64 y.o.  Admit date: 07/13/2013 Discharge date: 07/14/2013  PCP: Carrie Mew, MD  DISCHARGE DIAGNOSES:  Principal Problem:   Acute pulmonary embolism Active Problems:   GOUT   OBESITY   HYPERTENSION   Angioedema of lips   Multinodular goiter   RECOMMENDATIONS FOR OUTPATIENT FOLLOW UP: 1. Needs close follow up with PCP for Acute PE and BP check 2. Hypercoagulable panel is pending. Please follow up. 3. Consider further evaluation for multinodular goitre 4. Quinapril was stopped due to possibility of angioedema  DISCHARGE CONDITION: fair  Diet recommendation: Low Sodium  Filed Weights   07/13/13 0446 07/13/13 1123  Weight: 158.759 kg (350 lb) 169.328 kg (373 lb 4.8 oz)    INITIAL HISTORY: Franklin Hicks is a 62 y.o. male with a past medical history of hypertension, arthritis, BPH, who is morbidly obese, who was in his usual state of health till this past weekend when he started noticing a dull ache in the right side of his chest. He did not have any shortness of breath initially. However, subsequently, his breathing got worse. The chest pain, increased in intensity to become 10 out of 10. It also became sharp and worse with deep breathing. So since his chest pain and shortness of breath continued to get worse he decided to come in to the ED for further evaluation. Pain was located mostly in the right lower chest. He's never had similar chest pains in the past. Never been diagnosed with blood clots in the past. He does have a history of leg swelling, but this is chronic. Denied any increase in the swelling in the last couple of weeks. He did take a long road trip to [place] and back in early October. 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 Denied any weight loss recently. This past Saturday he was attending a football  game and he noticed that his upper lips had swollen up. He took some Benadryl with which the swelling resolved.   Consultations:  None  Procedures:  LE Venous Duplex: NO DVT  HOSPITAL COURSE:   Acute pulmonary embolism with mild RV strain He was started on heparin infusion. He remained slightly tachycardic but otherwise stable. EKG did suggest some strain pattern but repeat EKG looked stable. His symptoms are better today. LE doppler did not reveal DVT. Hypercoagulable work up is pending. Etiology for PE is not clear but possibly due to road trips and sedentary lifestyle with obesity. He has been transitioned to Xarelto. He will be discharged on same. Side effects and bleeding risks explained in detail.  Possible angioedema over the weekend Etiology remains unclear. Since he is on ACE inhibitor, this will be discontinued for now. He can follow up with his PCP.  Multinodular goiter Detected incidentally. Work up as an outpatient. TSH was in normal range.  History of hypertension Continue with Verapamil. Holding Quinapril as stated above. PCP to consider alternative agents at follow up depending on BP.  Morbid obesity He'll require weight loss counseling at some point.   Fatty Liver on [place] Outpatient work up recommended. Weight loss might help.  History of osteoarthritis and gout Stable.   History of BPH He takes saw palmetto. I've told him that now that he will be on anticoagulants he cannot take this herbal supplement anymore. Will also recommend stopping other herbal supplements. He should follow  up with PCP to consider alternative agents.  Will check Ra sats with ambulation to determine if he will require home Oxygen. Plan is for discharge later today. Patient remains stable. All his questions answered.   PERTINENT LABS:  The results of significant diagnostics from this hospitalization (including imaging, microbiology, ancillary and laboratory) are listed below for  reference.      Labs: Basic Metabolic Panel:  Recent Labs Lab 07/13/13 0538 07/14/13 0456  NA 139 133*  K 4.1 3.9  CL 102 95*  CO2 29 25  GLUCOSE 130* 168*  BUN 19 13  CREATININE 1.18 1.12  CALCIUM 9.7 8.6   Liver Function Tests:  Recent Labs Lab 07/13/13 0538 07/14/13 0456  AST 19 15  ALT 18 14  ALKPHOS 100 87  BILITOT 0.4 0.9  PROT 8.1 7.1  ALBUMIN 3.5 2.9*    Recent Labs Lab 07/13/13 0538 07/14/13 0456  LIPASE 72* 14   CBC:  Recent Labs Lab 07/13/13 0456 07/14/13 0456  WBC 11.2* 12.4*  NEUTROABS 7.8*  --   HGB 15.1 14.4  HCT 45.5 43.4  MCV 88.9 90.2  PLT 246 182   Cardiac Enzymes:  Recent Labs Lab 07/13/13 0538  TROPONINI <0.30   BNP: BNP (last 3 results)  Recent Labs  07/13/13 0538  PROBNP 274.4*    IMAGING STUDIES Ct Angio Chest W/cm &/or Wo Cm  07/13/2013   CLINICAL DATA:  Right-sided chest pain, shortness of breath and tachycardia.  EXAM: CT ANGIOGRAPHY CHEST  CT ABDOMEN AND PELVIS WITH CONTRAST  TECHNIQUE: Multidetector CT imaging of the chest was performed using the standard protocol during bolus administration of intravenous contrast. Multiplanar CT image reconstructions including MIPs were obtained to evaluate the vascular anatomy. Multidetector CT imaging of the abdomen and pelvis was performed using the standard protocol during bolus administration of intravenous contrast.  CONTRAST:  50mL OMNIPAQUE IOHEXOL 300 MG/ML SOLN, OMNIPAQUE IOHEXOL 350 MG/ML SOLN  COMPARISON:  07/21/2006  FINDINGS: CTA CHEST FINDINGS  The chest wall is unremarkable. No supraclavicular or axillary mass or adenopathy. The bony thorax is intact. No destructive bone lesions or spinal canal compromise. Moderate degenerative changes involving the thoracic spine and scattered hemangiomas.  The heart is upper limits of normal in size. No pericardial effusion. No mediastinal or hilar mass or adenopathy. The esophagus is grossly normal. The aorta is normal in  caliber. No dissection.  The pulmonary arterial tree is fairly well opacified. Moderate breathing motion artifact. There are bilateral segmental and subsegmental pulmonary emboli. Mild right ventricular strain pattern with flattening of the left ventricle. The RV/LV ratio is 0.9  Examination of the lung parenchyma demonstrates significant breathing motion artifact. Bibasilar atelectasis and small right pleural effusion or noted. Moderate eventration of the right hemidiaphragm. No worrisome pulmonary lesions or pneumothorax.  The upper abdomen is grossly normal.  CT ABDOMEN and PELVIS FINDINGS  The liver is unremarkable. No focal hepatic lesions or biliary dilatation. The gallbladder is normal. No common bowel duct dilatation. The pancreas is normal. The spleen is normal. The adrenal glands and kidneys are normal except for bilateral renal cysts and right-sided parapelvic cysts.  The stomach, duodenum, small bowel and colon are unremarkable. No inflammatory changes, mass lesions or obstructive findings. The appendix is normal. No mesenteric or retroperitoneal mass or adenopathy. Moderate atherosclerotic calcifications involving the aorta and branch vessels. The portal, hepatic, splenic and renal veins are patent.  The bladder, prostate gland and seminal vesicles are unremarkable. No pelvic mass, adenopathy  or free pelvic fluid collections. Borderline enlarged obturator lymph node on the left side is unchanged since 2007. No inguinal adenopathy.  The bony structures are intact. Stable advanced degenerative changes involving the lumbar spine L4-5 interbody fusion and posterior lateral fusion.  Review of the MIP images confirms the above findings.  IMPRESSION: 1. Bilateral segmental and subsegmental pulmonary emboli with mild right ventricular strain pattern. 2. Bibasilar atelectasis and small right effusion. 3. Normal thoracic aorta. 4. Multinodular thyroid goiter. 5. No acute abdominal/ pelvic findings, mass lesions  or adenopathy. Critical Value/emergent results were called by telephone at the time of interpretation on 07/13/2013 at 9:21 AM to Dr.IVA KNAPP , who verbally acknowledged these results.   Electronically Signed   By: Loralie Champagne M.D.   On: 07/13/2013 09:21   US Abdomen Complete  07/13/2013   CLINICAL DATA:  Right upper quadrant abdominal pain.  EXAM: ULTRASOUND ABDOMEN COMPLETE  COMPARISON:  CT scan 07/21/2006.  FINDINGS: Gallbladder  No gallstones or wall thickening visualized. No sonographic Murphy sign noted.  Common bile duct  Diameter: 5.7 mm  Liver  Diffuse increased echogenicity with poor through transmission and poor definition of the liver architecture consistent with severe and diffuse fatty infiltration. No focal hepatic lesions or intrahepatic biliary dilatation.  IVC  Normal caliber.  Pancreas  Sonographically unremarkable.  Spleen  Normal size and echogenicity without focal lesions.  Right Kidney  Length: 13.2 cm. Echogenicity within normal limits. No mass or hydronephrosis visualized.  Left Kidney  Length: 13.5 cm. Normal renal cortical thickness and echogenicity without hydronephrosis. A 2.8 cm cyst is noted. Suspect 8 mm midpole calculus.  Abdominal aorta  Normal caliber.  IMPRESSION: 1. Diffuse fatty infiltration of the liver. 2. Normal gallbladder and normal caliber common bile duct. 3. Left renal calculus and simple appearing cyst.   Electronically Signed   By: Loralie Champagne M.D.   On: 07/13/2013 07:27   Ct Abdomen Pelvis W Contrast  07/13/2013   CLINICAL DATA:  Right-sided chest pain, shortness of breath and tachycardia.  EXAM: CT ANGIOGRAPHY CHEST  CT ABDOMEN AND PELVIS WITH CONTRAST  TECHNIQUE: Multidetector CT imaging of the chest was performed using the standard protocol during bolus administration of intravenous contrast. Multiplanar CT image reconstructions including MIPs were obtained to evaluate the vascular anatomy. Multidetector CT imaging of the abdomen and pelvis was  performed using the standard protocol during bolus administration of intravenous contrast.  CONTRAST:  50mL OMNIPAQUE IOHEXOL 300 MG/ML SOLN, OMNIPAQUE IOHEXOL 350 MG/ML SOLN  COMPARISON:  07/21/2006  FINDINGS: CTA CHEST FINDINGS  The chest wall is unremarkable. No supraclavicular or axillary mass or adenopathy. The bony thorax is intact. No destructive bone lesions or spinal canal compromise. Moderate degenerative changes involving the thoracic spine and scattered hemangiomas.  The heart is upper limits of normal in size. No pericardial effusion. No mediastinal or hilar mass or adenopathy. The esophagus is grossly normal. The aorta is normal in caliber. No dissection.  The pulmonary arterial tree is fairly well opacified. Moderate breathing motion artifact. There are bilateral segmental and subsegmental pulmonary emboli. Mild right ventricular strain pattern with flattening of the left ventricle. The RV/LV ratio is 0.9  Examination of the lung parenchyma demonstrates significant breathing motion artifact. Bibasilar atelectasis and small right pleural effusion or noted. Moderate eventration of the right hemidiaphragm. No worrisome pulmonary lesions or pneumothorax.  The upper abdomen is grossly normal.  CT ABDOMEN and PELVIS FINDINGS  The liver is unremarkable. No focal hepatic lesions  or biliary dilatation. The gallbladder is normal. No common bowel duct dilatation. The pancreas is normal. The spleen is normal. The adrenal glands and kidneys are normal except for bilateral renal cysts and right-sided parapelvic cysts.  The stomach, duodenum, small bowel and colon are unremarkable. No inflammatory changes, mass lesions or obstructive findings. The appendix is normal. No mesenteric or retroperitoneal mass or adenopathy. Moderate atherosclerotic calcifications involving the aorta and branch vessels. The portal, hepatic, splenic and renal veins are patent.  The bladder, prostate gland and seminal vesicles are  unremarkable. No pelvic mass, adenopathy or free pelvic fluid collections. Borderline enlarged obturator lymph node on the left side is unchanged since 2007. No inguinal adenopathy.  The bony structures are intact. Stable advanced degenerative changes involving the lumbar spine L4-5 interbody fusion and posterior lateral fusion.  Review of the MIP images confirms the above findings.  IMPRESSION: 1. Bilateral segmental and subsegmental pulmonary emboli with mild right ventricular strain pattern. 2. Bibasilar atelectasis and small right effusion. 3. Normal thoracic aorta. 4. Multinodular thyroid goiter. 5. No acute abdominal/ pelvic findings, mass lesions or adenopathy. Critical Value/emergent results were called by telephone at the time of interpretation on 07/13/2013 at 9:21 AM to Dr.IVA KNAPP , who verbally acknowledged these results.   Electronically Signed   By: Loralie Champagne M.D.   On: 07/13/2013 09:21   Dg Chest Port 1 View  07/13/2013   CLINICAL DATA:  New onset shortness of breath.  EXAM: PORTABLE CHEST - 1 VIEW  COMPARISON:  Chest radiograph May 27, 2006  FINDINGS: Cardiomediastinal silhouette is unremarkable for this low inspiratory portable examination with crowded central engorged vasculature markings. The lungs are clear without pleural effusions or focal consolidations. Trachea projects midline and there is no pneumothorax. Included soft tissue planes and osseous structures are non-suspicious. Multiple EKG lines overlie the patient and may obscure subtle underlying pathology.  IMPRESSION: Central pulmonary vasculature congestion may be accentuated by low inspiratory examination.   Electronically Signed   By: Awilda Metro   On: 07/13/2013 05:18    DISCHARGE EXAMINATION: Filed Vitals:   07/13/13 1123 07/13/13 1806 07/13/13 2108 07/14/13 0520  BP: 154/84 159/67 151/86 158/89  Pulse: 102 102 106 111  Temp: 98.8 F (37.1 C)  99.6 F (37.6 C) 98.8 F (37.1 C)  TempSrc: Oral  Oral  Oral  Resp: 20  20 18   Height: 6' (1.829 m)     Weight: 169.328 kg (373 lb 4.8 oz)     SpO2:   96% 95%   General appearance: alert, cooperative, appears stated age, no distress and morbidly obese Resp: clear to auscultation bilaterally Cardio: regular rate and rhythm, S1, S2 normal, no murmur, click, rub or gallop GI: soft, non-tender; bowel sounds normal; no masses,  no organomegaly Neurologic: Alert and oriented X 3, normal strength and tone. Normal symmetric reflexes. Normal coordination and gait  DISPOSITION: Home   Future Appointments Provider Department Dept Phone   07/18/2013 1:30 PM Stacie Glaze, MD [facility] [place] [phone]      ALLERGIES:  Allergies  Allergen Reactions  . Ace Inhibitors     Angioedema  . Other Hives and Itching    msg    Current Discharge Medication List    START taking these medications   Details  oxyCODONE (OXY IR/ROXICODONE) 5 MG immediate release tablet Take 1 tablet (5 mg total) by mouth every 4 (four) hours as needed for moderate pain. Qty: 15 tablet, Refills: 0  Rivaroxaban (XARELTO STARTER PACK) 15 & 20 MG TBPK Take as directed on package: Start with one 15mg  tablet by mouth twice a day with food. On Day 22, switch to one 20mg  tablet once a day with food. Qty: 51 each, Refills: 0    Rivaroxaban (XARELTO) 20 MG TABS tablet 20 mg (1 tablet) once daily after you finish up with the starter pack. Qty: 30 tablet, Refills: 3      CONTINUE these medications which have NOT CHANGED   Details  atorvastatin (LIPITOR) 40 MG tablet TAKE 1 TABLET (40 MG TOTAL) BY MOUTH DAILY. Qty: 90 tablet, Refills: 3    verapamil (CALAN-SR) 240 MG CR tablet Take 1 tablet (240 mg total) by mouth daily. Qty: 90 tablet, Refills: 3    vitamin B-12 (CYANOCOBALAMIN) 100 MCG tablet Take 100 mcg by mouth daily.      STOP taking these medications     Ascorbic Acid (VITAMIN C) 100 MG tablet      aspirin 81 MG tablet      CHROMIUM PO       CINNAMON PO      quinapril (ACCUPRIL) 40 MG tablet      Saw Palmetto 1000 MG CAPS      ibuprofen (ADVIL,MOTRIN) 800 MG tablet        Follow-up Information   Follow up with Carrie Mew, MD. Schedule an appointment as soon as possible for a visit in 4 days.   Specialty:  Internal Medicine   Contact information:   [address] [place] [address] [phone]       TOTAL DISCHARGE TIME: 35 mins  [facility]  [facility] Pager [phone]  07/14/2013, 11:17 AM

## 2013-07-18 ENCOUNTER — Ambulatory Visit (INDEPENDENT_AMBULATORY_CARE_PROVIDER_SITE_OTHER): Payer: PRIVATE HEALTH INSURANCE | Admitting: Internal Medicine

## 2013-07-18 ENCOUNTER — Encounter: Payer: Self-pay | Admitting: Internal Medicine

## 2013-07-18 VITALS — BP 140/76 | HR 80 | Temp 98.0°F | Resp 18 | Ht 72.0 in | Wt 350.0 lb

## 2013-07-18 DIAGNOSIS — N400 Enlarged prostate without lower urinary tract symptoms: Secondary | ICD-10-CM

## 2013-07-18 DIAGNOSIS — M25569 Pain in unspecified knee: Secondary | ICD-10-CM

## 2013-07-18 DIAGNOSIS — I2699 Other pulmonary embolism without acute cor pulmonale: Secondary | ICD-10-CM

## 2013-07-18 DIAGNOSIS — M25561 Pain in right knee: Secondary | ICD-10-CM

## 2013-07-18 MED ORDER — TRAMADOL HCL 50 MG PO TABS: 50.0000 mg | ORAL_TABLET | Freq: Three times a day (TID) | ORAL | Status: DC | PRN

## 2013-07-18 MED ORDER — TERAZOSIN HCL 2 MG PO CAPS: 2.0000 mg | ORAL_CAPSULE | Freq: Every day | ORAL | Status: DC

## 2013-07-18 MED ORDER — ACETAMINOPHEN 500 MG PO TABS: 500.0000 mg | ORAL_TABLET | Freq: Four times a day (QID) | ORAL | Status: DC | PRN

## 2013-07-18 MED ORDER — RIVAROXABAN 20 MG PO TABS: ORAL_TABLET | ORAL | Status: DC

## 2013-07-18 NOTE — Patient Instructions (Addendum)
Check blood pressure at home or at church or at [employer] 2-3 times a week If the to number is greater that 150 call or my chart message me   Refer to nutritional therapy for obesity     Add Prilosec to protect from ulcers while on the  Blood thinners

## 2013-07-18 NOTE — Progress Notes (Signed)
Subjective:    Patient ID: Franklin Hicks, male    DOB: 1950/12/05, 62 y.o.   MRN: [id number]  HPI The patient was seen in the ER for PE and placed him of xarelto for up to 6 months Risks are morbid obesity and sedetary lifestyle History of lipid disorder and HTN DVT risk high CT angio showed bilateral PE Dopplers DVT  No cardiac Echo   Review of Systems  HENT: Positive for rhinorrhea and sinus pressure.   Cardiovascular: Positive for leg swelling.  Gastrointestinal: Negative.   Endocrine: Negative.   Genitourinary: Positive for frequency.  Musculoskeletal: Positive for back pain, gait problem, joint swelling and myalgias.  Allergic/Immunologic: Negative.   Neurological: Negative.   Hematological: Negative.   Psychiatric/Behavioral: Negative.    Past Medical History  Diagnosis Date  . Gout   . Hypertension   . DJD (degenerative joint disease)   . Obesity     History   Social History  . Marital Status: Married    Spouse Name: N/A    Number of Children: N/A  . Years of Education: N/A   Occupational History  . teacher    Social History Main Topics  . Smoking status: Former Smoker    Types: Cigarettes    Quit date: 07/13/1980  . Smokeless tobacco: Never Used  . Alcohol Use: No  . Drug Use: No  . Sexual Activity: No   Other Topics Concern  . Not on file   Social History Narrative  . No narrative on file    Past Surgical History  Procedure Laterality Date  . US echocardiography  2004    no lvh nl ejection fraction  . Ureteral stone  [id number]  . Left knee open meniscetomy      Family History  Problem Relation Age of Onset  . Liver disease Mother     Allergies  Allergen Reactions  . Ace Inhibitors     Angioedema  . Other Hives and Itching    msg    Current Outpatient Prescriptions on File Prior to Visit  Medication Sig Dispense Refill  . atorvastatin (LIPITOR) 40 MG tablet TAKE 1 TABLET (40 MG TOTAL) BY MOUTH DAILY.  90 tablet  3  .  oxyCODONE (OXY IR/ROXICODONE) 5 MG immediate release tablet Take 1 tablet (5 mg total) by mouth every 4 (four) hours as needed for moderate pain.  15 tablet  0  . Rivaroxaban (XARELTO STARTER PACK) 15 & 20 MG TBPK Take as directed on package: Start with one 15mg  tablet by mouth twice a day with food. On Day 22, switch to one 20mg  tablet once a day with food.  51 each  0  . Rivaroxaban (XARELTO) 20 MG TABS tablet 20 mg (1 tablet) once daily after you finish up with the starter pack.  30 tablet  3  . verapamil (CALAN-SR) 240 MG CR tablet Take 1 tablet (240 mg total) by mouth daily.  90 tablet  3  . vitamin B-12 (CYANOCOBALAMIN) 100 MCG tablet Take 100 mcg by mouth daily.       No current facility-administered medications on file prior to visit.    BP 140/76  Pulse 80  Temp(Src) 98 F (36.7 C)  Resp 18  Ht 6' (1.829 m)  Wt 350 lb (158.759 kg)  BMI 47.46 kg/m2       Objective:   Physical Exam  Nursing note and vitals reviewed. Constitutional: He is oriented to person, place, and time. He appears well-developed and well-nourished.  Eyes: Conjunctivae are normal. Pupils are equal, round, and reactive to light.  Cardiovascular: Normal rate and regular rhythm.   Murmur heard. Pulmonary/Chest: Effort normal and breath sounds normal.  Abdominal: Bowel sounds are normal.  Neurological: He is alert and oriented to person, place, and time.  Skin: Skin is dry.  Psychiatric: He has a normal mood and affect. His behavior is normal.          Assessment & Plan:  High risk patient due to obesity Needed Echo to make sure source is not cardiac I suspect  LE or pelvic source due to morbid obesity  Bleeding risks are his main concern Add prilosec

## 2013-07-18 NOTE — Progress Notes (Signed)
Pre visit review using our clinic review tool, if applicable. No additional management support is needed unless otherwise documented below in the visit note. 

## 2013-10-31 ENCOUNTER — Other Ambulatory Visit: Payer: Self-pay | Admitting: Internal Medicine

## 2013-11-21 ENCOUNTER — Encounter: Payer: Self-pay | Admitting: Internal Medicine

## 2013-11-21 ENCOUNTER — Ambulatory Visit (INDEPENDENT_AMBULATORY_CARE_PROVIDER_SITE_OTHER): Payer: PRIVATE HEALTH INSURANCE | Admitting: Internal Medicine

## 2013-11-21 VITALS — BP 130/90 | HR 92 | Temp 97.9°F | Ht 72.0 in | Wt 370.0 lb

## 2013-11-21 DIAGNOSIS — R739 Hyperglycemia, unspecified: Secondary | ICD-10-CM

## 2013-11-21 DIAGNOSIS — T887XXA Unspecified adverse effect of drug or medicament, initial encounter: Secondary | ICD-10-CM

## 2013-11-21 DIAGNOSIS — R7309 Other abnormal glucose: Secondary | ICD-10-CM

## 2013-11-21 DIAGNOSIS — M1710 Unilateral primary osteoarthritis, unspecified knee: Secondary | ICD-10-CM

## 2013-11-21 NOTE — Patient Instructions (Signed)
The patient is instructed to continue all medications as prescribed. Schedule followup with check out clerk upon leaving the clinic  

## 2013-11-21 NOTE — Progress Notes (Signed)
Pre visit review using our clinic review tool, if applicable. No additional management support is needed unless otherwise documented below in the visit note. 

## 2013-11-21 NOTE — Progress Notes (Signed)
Subjective:    Patient ID: Franklin Hicks, male    DOB: April 07, 1951, 63 y.o.   MRN: [id number]  HPI Weight loss for TKR  HTN Stable  Severe DJD    Review of Systems  Constitutional: Negative for fever and fatigue.  HENT: Negative for congestion, hearing loss and postnasal drip.   Eyes: Negative for discharge, redness and visual disturbance.  Respiratory: Negative for cough, shortness of breath and wheezing.   Cardiovascular: Negative for leg swelling.  Gastrointestinal: Negative for abdominal pain, constipation and abdominal distention.  Genitourinary: Negative for urgency and frequency.  Musculoskeletal: Negative for arthralgias, joint swelling and neck pain.  Skin: Negative for color change and rash.  Neurological: Negative for weakness and light-headedness.  Hematological: Negative for adenopathy.  Psychiatric/Behavioral: Negative for behavioral problems.       Past Medical History  Diagnosis Date  . Gout   . Hypertension   . DJD (degenerative joint disease)   . Obesity     History   Social History  . Marital Status: Married    Spouse Name: N/A    Number of Children: N/A  . Years of Education: N/A   Occupational History  . teacher    Social History Main Topics  . Smoking status: Former Smoker    Types: Cigarettes    Quit date: 07/13/1980  . Smokeless tobacco: Never Used  . Alcohol Use: No  . Drug Use: No  . Sexual Activity: No   Other Topics Concern  . Not on file   Social History Narrative  . No narrative on file    Past Surgical History  Procedure Laterality Date  . US echocardiography  2004    no lvh nl ejection fraction  . Ureteral stone  [id number]  . Left knee open meniscetomy      Family History  Problem Relation Age of Onset  . Liver disease Mother     Allergies  Allergen Reactions  . Ace Inhibitors     Angioedema  . Other Hives and Itching    msg    Current Outpatient Prescriptions on File Prior to Visit  Medication Sig  Dispense Refill  . acetaminophen (TYLENOL) 500 MG tablet Take 1 tablet (500 mg total) by mouth every 6 (six) hours as needed.  30 tablet  0  . atorvastatin (LIPITOR) 40 MG tablet TAKE 1 TABLET (40 MG TOTAL) BY MOUTH DAILY.  90 tablet  3  . oxyCODONE (OXY IR/ROXICODONE) 5 MG immediate release tablet Take 1 tablet (5 mg total) by mouth every 4 (four) hours as needed for moderate pain.  15 tablet  0  . Rivaroxaban (XARELTO) 20 MG TABS tablet 20 mg (1 tablet) once daily after you finish up with the starter pack.  30 tablet  3  . terazosin (HYTRIN) 2 MG capsule Take 1 capsule (2 mg total) by mouth at bedtime.  30 capsule  6  . traMADol (ULTRAM) 50 MG tablet Take 1 tablet (50 mg total) by mouth every 8 (eight) hours as needed.  90 tablet  3  . verapamil (CALAN-SR) 240 MG CR tablet TAKE 1 TABLET (240 MG TOTAL) BY MOUTH DAILY.  90 tablet  3  . vitamin B-12 (CYANOCOBALAMIN) 100 MCG tablet Take 100 mcg by mouth daily.       No current facility-administered medications on file prior to visit.    BP 130/90  Pulse 92  Temp(Src) 97.9 F (36.6 C) (Oral)  Ht 6' (1.829 m)  Wt 370 lb (  167.831 kg)  BMI 50.17 kg/m2  SpO2 97%    Objective:   Physical Exam  Nursing note and vitals reviewed. Constitutional: He is oriented to person, place, and time. He appears well-developed and well-nourished.  HENT:  Head: Normocephalic and atraumatic.  Eyes: Conjunctivae are normal. Pupils are equal, round, and reactive to light.  Neck: Normal range of motion. Neck supple.  Cardiovascular: Normal rate and regular rhythm.   Murmur heard. Pulmonary/Chest: Effort normal and breath sounds normal.  Abdominal: Soft. Bowel sounds are normal.  Musculoskeletal: He exhibits edema and tenderness.  Neurological: He is alert and oriented to person, place, and time.  Skin: Skin is warm and dry.          Assessment & Plan:  HTN stable Severe DJD of knees BPH On hytrin for nocturia On blood thiner for DVT Started Nov  and stops in May

## 2013-11-22 LAB — CBC WITH DIFFERENTIAL/PLATELET
Basophils Absolute: 0 10*3/uL (ref 0.0–0.1)
Basophils Relative: 0.3 % (ref 0.0–3.0)
EOS ABS: 0.1 10*3/uL (ref 0.0–0.7)
Eosinophils Relative: 1.4 % (ref 0.0–5.0)
HCT: 43.2 % (ref 39.0–52.0)
HEMOGLOBIN: 14 g/dL (ref 13.0–17.0)
Lymphocytes Relative: 27.3 % (ref 12.0–46.0)
Lymphs Abs: 2.4 10*3/uL (ref 0.7–4.0)
MCHC: 32.4 g/dL (ref 30.0–36.0)
MCV: 88.5 fl (ref 78.0–100.0)
MONO ABS: 0.4 10*3/uL (ref 0.1–1.0)
Monocytes Relative: 4.9 % (ref 3.0–12.0)
NEUTROS ABS: 5.8 10*3/uL (ref 1.4–7.7)
NEUTROS PCT: 66.1 % (ref 43.0–77.0)
Platelets: 263 10*3/uL (ref 150.0–400.0)
RBC: 4.88 Mil/uL (ref 4.22–5.81)
RDW: 14.6 % (ref 11.5–14.6)
WBC: 8.8 10*3/uL (ref 4.5–10.5)

## 2013-11-22 LAB — HEMOGLOBIN A1C: Hgb A1c MFr Bld: 6.3 % (ref 4.6–6.5)

## 2013-11-24 ENCOUNTER — Telehealth: Payer: Self-pay | Admitting: Internal Medicine

## 2013-11-24 NOTE — Telephone Encounter (Signed)
Pt is currently taking Rivaroxaban (XARELTO) 20 MG TABS tablet. Dr. Lovell SheehanJenkins  informed him to make him aware if he got any bruises anywhere. Pt states he had labs done on Monday and he now has a bruise the size of a 50 cent piece.m wanted to make dr.jenkins aware of this

## 2013-11-28 NOTE — Telephone Encounter (Signed)
noted 

## 2013-12-20 ENCOUNTER — Encounter: Payer: Self-pay | Admitting: Internal Medicine

## 2013-12-26 ENCOUNTER — Telehealth: Payer: Self-pay | Admitting: Internal Medicine

## 2013-12-26 NOTE — Telephone Encounter (Signed)
Pt called and said he is suppose to come off of  Rivaroxaban (XARELTO) 20 MG TABS tablet this month and would like a phone call to let him know when to stop taking it

## 2013-12-26 NOTE — Telephone Encounter (Signed)
Per Dr. Lovell SheehanJenkins pt must complete the month of May then he can discontinue.

## 2013-12-27 NOTE — Telephone Encounter (Signed)
Called and spoke with pt and pt is aware of Dr. Jenkins' recommendations.   

## 2014-01-10 ENCOUNTER — Telehealth: Payer: Self-pay | Admitting: Internal Medicine

## 2014-01-10 DIAGNOSIS — I82409 Acute embolism and thrombosis of unspecified deep veins of unspecified lower extremity: Secondary | ICD-10-CM

## 2014-01-10 DIAGNOSIS — O2230 Deep phlebothrombosis in pregnancy, unspecified trimester: Secondary | ICD-10-CM

## 2014-01-10 NOTE — Telephone Encounter (Signed)
Pt has been taking  Rivaroxaban (XARELTO) 20 MG TABS tablet  For 6 months and was told to stop taking this med at the end of this month  Pt would like to know if he just stop as per Dr Lovell SheehanJenkins request he also would like to know If he needs a follow up visit and if there are any additional test that he needs to do to ensure that the blood clots are gone

## 2014-01-15 ENCOUNTER — Other Ambulatory Visit: Payer: Self-pay | Admitting: Internal Medicine

## 2014-01-26 ENCOUNTER — Encounter (HOSPITAL_COMMUNITY): Payer: PRIVATE HEALTH INSURANCE

## 2014-01-26 ENCOUNTER — Ambulatory Visit (HOSPITAL_COMMUNITY): Payer: PRIVATE HEALTH INSURANCE | Attending: Internal Medicine | Admitting: Cardiology

## 2014-01-26 DIAGNOSIS — I10 Essential (primary) hypertension: Secondary | ICD-10-CM | POA: Insufficient documentation

## 2014-01-26 DIAGNOSIS — R609 Edema, unspecified: Secondary | ICD-10-CM

## 2014-01-26 DIAGNOSIS — Z87891 Personal history of nicotine dependence: Secondary | ICD-10-CM | POA: Insufficient documentation

## 2014-01-26 DIAGNOSIS — R0602 Shortness of breath: Secondary | ICD-10-CM

## 2014-01-26 DIAGNOSIS — E785 Hyperlipidemia, unspecified: Secondary | ICD-10-CM | POA: Insufficient documentation

## 2014-01-26 DIAGNOSIS — Z86711 Personal history of pulmonary embolism: Secondary | ICD-10-CM | POA: Insufficient documentation

## 2014-01-26 DIAGNOSIS — I82409 Acute embolism and thrombosis of unspecified deep veins of unspecified lower extremity: Secondary | ICD-10-CM

## 2014-01-26 NOTE — Progress Notes (Signed)
Lower venous duplex bilateral completed. 

## 2014-02-06 ENCOUNTER — Encounter: Payer: Self-pay | Admitting: Physician Assistant

## 2014-02-06 ENCOUNTER — Ambulatory Visit (INDEPENDENT_AMBULATORY_CARE_PROVIDER_SITE_OTHER): Payer: PRIVATE HEALTH INSURANCE | Admitting: Physician Assistant

## 2014-02-06 VITALS — BP 140/78 | HR 84 | Temp 97.8°F | Resp 18 | Wt 370.0 lb

## 2014-02-06 DIAGNOSIS — Z09 Encounter for follow-up examination after completed treatment for conditions other than malignant neoplasm: Secondary | ICD-10-CM

## 2014-02-06 DIAGNOSIS — I82409 Acute embolism and thrombosis of unspecified deep veins of unspecified lower extremity: Secondary | ICD-10-CM

## 2014-02-06 NOTE — Patient Instructions (Addendum)
Continue your current medications. You should have refills on all of these now.  You can continue your baby aspirin regimen.  You can continue your saw palmetto supplementation.  There is probably no added benefit from continuing vitamin C and fish oil supplementation.  You cannot continue quinapril, as you had allergic type reaction to this in the past.  You should call the office and leave a message to ask Dr. Lovell SheehanJenkins specifically how to dose the Xarelto for long trips as he mentioned to you previously.  Monitor your Headache symptoms on terazosin, and return if they worsen or become more bothersome.  Followup as needed, or for woresening symptoms.   Terazosin capsules What is this medicine? TERAZOSIN (ter AY zoe sin) is an antihypertensive. It works by relaxing the blood vessels. It is used to treat benign prostatic hyperplasia (BPH) in men and to treat high blood pressure in both men and women. This medicine may be used for other purposes; ask your health care provider or pharmacist if you have questions. COMMON BRAND NAME(S): Hytrin What should I tell my health care provider before I take this medicine? They need to know if you have any of the following conditions: -an unusual or allergic reaction to terazosin, other medicines, foods, dyes, or preservatives -pregnant or trying to get pregnant -breast-feeding How should I use this medicine? Take this medicine by mouth with a glass of water. Follow the directions on the prescription label. You can take this medicine with or without food. Take your doses at regular intervals. Do not take your medicine more often than directed. Do not stop taking except on the advice of your doctor or health care professional. Talk to your pediatrician regarding the use of this medicine in children. Special care may be needed. Overdosage: If you think you have taken too much of this medicine contact a poison control center or emergency room at  once. NOTE: This medicine is only for you. Do not share this medicine with others. What if I miss a dose? If you miss a dose, take it as soon as you can. If it is almost time for your next dose, take only that dose. Do not take double or extra doses. What may interact with this medicine? -diuretics -medicines for high blood pressure -sildenafil -tadalafil -vardenafil This list may not describe all possible interactions. Give your health care provider a list of all the medicines, herbs, non-prescription drugs, or dietary supplements you use. Also tell them if you smoke, drink alcohol, or use illegal drugs. Some items may interact with your medicine. What should I watch for while using this medicine? Visit your doctor or health care professional for regular checks on your progress. Check your blood pressure regularly. Ask your doctor or health care professional what your blood pressure should be and when you should contact him or her. Drowsiness and dizziness are more likely to occur after the first dose, after an increase in dose, or during hot weather or exercise. These effects can decrease once your body adjusts to this medicine. Do not drive, use machinery, or do anything that needs mental alertness until you know how this drug affects you. Do not stand or sit up quickly, especially if you are an older patient. This reduces the risk of dizzy or fainting spells. Alcohol can make you more drowsy and dizzy. Avoid alcoholic drinks. Do not treat yourself for coughs, colds, or pain while you are taking this medicine without asking your doctor or health care professional  for advice. Some ingredients may increase your blood pressure. Your mouth may get dry. Chewing sugarless gum or sucking hard candy, and drinking plenty of water may help. Contact your doctor if the problem does not go away or is severe. For males, contact your doctor or health care professional right away if you have an erection that  lasts longer than 4 hours or if it becomes painful. This may be a sign of a serious problem and must be treated right away to prevent permanent damage. What side effects may I notice from receiving this medicine? Side effects that you should report to your doctor or health care professional as soon as possible: -blurred vision -difficulty breathing, shortness of breath -fainting spells, light headedness -fast or irregular heartbeat, palpitations or chest pain -males: prolonged or painful erection -swelling of the legs and ankles -unusually weak or tired Side effects that usually do not require medical attention (report to your doctor or health care professional if they continue or are bothersome): -headache -nausea -nasal stuffiness This list may not describe all possible side effects. Call your doctor for medical advice about side effects. You may report side effects to FDA at 1-800-FDA-1088. Where should I keep my medicine? Keep out of the reach of children. Store at room temperature between 20 and 25 degrees C (68 and 77 degrees F). Higher temperatures may cause the capsules to soften or melt. Protect from light and moisture. Throw away any unused medicine after the expiration date. NOTE: This sheet is a summary. It may not cover all possible information. If you have questions about this medicine, talk to your doctor, pharmacist, or health care provider.  2014, Elsevier/Gold Standard. (2012-08-11 14:07:39)

## 2014-02-06 NOTE — Progress Notes (Signed)
Subjective:    Patient ID: Franklin Hicks, male    DOB: 02/17/1951, 63 y.o.   MRN: [id number]  HPI Pt 63 year old African American male presenting to the clinic for medication followup. Recently on Xarelto by PCP due to blood clotting issues (DVT/PE), therapy lasted 6 months, had blood levels checked last week and was told everything checked out fine. Told he no longer needed Xarelto therapy. Was wondering when/if he could restart his usual medications and supplements: quinapril, baby aspirin, fish oil, saw palmetto, vitamin C. Tolerating medications well. States that he occasionally gets headaches from terazosin, no other adverse effects. Had angioedema with Quinapril in the past, which was why he was switched to Terazosin. He is currently asymptomatic. He denies fevers, chills, nausea, vomiting, diarrhea, shortness of breath, chest pain, headache.  Review of Systems As per HPI and otherwise negative.   Past Medical History  Diagnosis Date  . Gout   . Hypertension   . DJD (degenerative joint disease)   . Obesity     History   Social History  . Marital Status: Married    Spouse Name: N/A    Number of Children: N/A  . Years of Education: N/A   Occupational History  . teacher    Social History Main Topics  . Smoking status: Former Smoker    Types: Cigarettes    Quit date: 07/13/1980  . Smokeless tobacco: Never Used  . Alcohol Use: No  . Drug Use: No  . Sexual Activity: No   Other Topics Concern  . Not on file   Social History Narrative  . No narrative on file    Past Surgical History  Procedure Laterality Date  . [place] echocardiography  2004    no lvh nl ejection fraction  . Ureteral stone  [id number]  . Left knee open meniscetomy      Family History  Problem Relation Age of Onset  . Liver disease Mother     Allergies  Allergen Reactions  . Ace Inhibitors     Angioedema  . Other Hives and Itching    msg    Current Outpatient Prescriptions on File Prior to  Visit  Medication Sig Dispense Refill  . acetaminophen (TYLENOL) 500 MG tablet Take 1 tablet (500 mg total) by mouth every 6 (six) hours as needed.  30 tablet  0  . atorvastatin (LIPITOR) 40 MG tablet TAKE 1 TABLET (40 MG TOTAL) BY MOUTH DAILY.  90 tablet  3  . oxyCODONE (OXY IR/ROXICODONE) 5 MG immediate release tablet Take 1 tablet (5 mg total) by mouth every 4 (four) hours as needed for moderate pain.  15 tablet  0  . Rivaroxaban (XARELTO) 20 MG TABS tablet 20 mg (1 tablet) once daily after you finish up with the starter pack.  30 tablet  3  . terazosin (HYTRIN) 2 MG capsule Take 1 capsule (2 mg total) by mouth at bedtime.  30 capsule  6  . traMADol (ULTRAM) 50 MG tablet TAKE 1 TABLET BY MOUTH EVERY 8 HOURS AS NEEDED  90 tablet  3  . verapamil (CALAN-SR) 240 MG CR tablet TAKE 1 TABLET (240 MG TOTAL) BY MOUTH DAILY.  90 tablet  3  . vitamin B-12 (CYANOCOBALAMIN) 100 MCG tablet Take 100 mcg by mouth daily.       No current facility-administered medications on file prior to visit.    EXAM: BP 140/78  Pulse 84  Temp(Src) 97.8 F (36.6 C) (Oral)  Resp 18  Wt  370 lb (167.831 kg)  SpO2 98%     Objective:   Physical Exam  Nursing note and vitals reviewed. Constitutional: He is oriented to person, place, and time. He appears well-developed and well-nourished. No distress.  HENT:  Head: Normocephalic and atraumatic.  Eyes: Conjunctivae and EOM are normal. Pupils are equal, round, and reactive to light.  Neck: Normal range of motion. Neck supple.  Cardiovascular: Normal rate, regular rhythm, normal heart sounds and intact distal pulses.  Exam reveals no gallop and no friction rub.   No murmur heard. Pulmonary/Chest: Effort normal and breath sounds normal. No respiratory distress. He has no wheezes. He has no rales. He exhibits no tenderness.  Musculoskeletal: Normal range of motion.  Neurological: He is alert and oriented to person, place, and time.  Skin: Skin is warm and dry. No rash  noted. He is not diaphoretic. No erythema. No pallor.  Psychiatric: He has a normal mood and affect. His behavior is normal. Judgment and thought content normal.    Lab Results  Component Value Date   WBC 8.8 11/21/2013   HGB 14.0 11/21/2013   HCT 43.2 11/21/2013   PLT 263.0 11/21/2013   GLUCOSE 168* 07/14/2013   CHOL 145 03/02/2013   TRIG 60.0 03/02/2013   HDL 45.00 03/02/2013   LDLCALC 88 03/02/2013   ALT 14 07/14/2013   AST 15 07/14/2013   NA 133* 07/14/2013   K 3.9 07/14/2013   CL 95* 07/14/2013   CREATININE 1.12 07/14/2013   BUN 13 07/14/2013   CO2 25 07/14/2013   TSH 0.941 07/13/2013   PSA 1.19 03/02/2013   INR 1.06 07/13/2013   HGBA1C 6.3 11/21/2013         Assessment & Plan:  Baldo AshCarl was seen today for follow-up.  Diagnoses and associated orders for this visit:  Follow up Comments: Tolerating medications well. Occasional HA with terazosin. Will monitor and ROV if becomes bothersome.  DVT (deep venous thrombosis) Comments: Resolved. Now off Xarelto. Can continue Baby aspirin.   Pt will call PCP and request information on dosing of Xarelto if he goes on a long trip in the future, seeing as this is how his original DVT developed.  Pt can restart Saw Palmetto supplement. Educated on potential lack of benefit from vitamin C and Fish oil supplementation.  Patient will monitor headache symptoms on Terazosin, and return to clinic if these worsen or become more bothersome.  Return precautions provided.  Plan to follow up as needed, or for worsening symptoms.  Patient Instructions  Continue your current medications. You should have refills on all of these now.  You can continue your baby aspirin regimen.  You can continue your saw palmetto supplementation.  There is probably no added benefit from continuing vitamin C and fish oil supplementation.  You cannot continue quinapril, as you had allergic type reaction to this in the past.  You should call the office and leave a  message to ask Dr. Lovell SheehanJenkins specifically how to dose the Xarelto for long trips as he mentioned to you previously.  Monitor your Headache symptoms on terazosin, and return if they worsen or become more bothersome.  Followup as needed, or for woresening symptoms.

## 2014-02-06 NOTE — Progress Notes (Signed)
Pre visit review using our clinic review tool, if applicable. No additional management support is needed unless otherwise documented below in the visit note. 

## 2014-02-28 ENCOUNTER — Other Ambulatory Visit: Payer: Self-pay | Admitting: Internal Medicine

## 2014-05-17 ENCOUNTER — Other Ambulatory Visit: Payer: Self-pay | Admitting: Internal Medicine

## 2014-08-03 ENCOUNTER — Telehealth (HOSPITAL_COMMUNITY): Payer: Self-pay | Admitting: *Deleted

## 2014-08-03 NOTE — Telephone Encounter (Signed)
Spoke to pt & states had "never had colonoscopy before", not planning to have one this year.

## 2014-10-10 ENCOUNTER — Telehealth: Payer: Self-pay | Admitting: Internal Medicine

## 2014-10-10 NOTE — Telephone Encounter (Signed)
Pt has appt in April 2016. And needs refill on terazosin 2 mg #30 w/[facility]ill [facility] [address],

## 2014-10-11 MED ORDER — TERAZOSIN HCL 2 MG PO CAPS: ORAL_CAPSULE | ORAL | Status: DC

## 2014-10-11 NOTE — Telephone Encounter (Signed)
Medication refilled

## 2014-10-31 ENCOUNTER — Other Ambulatory Visit: Payer: Self-pay | Admitting: *Deleted

## 2014-10-31 MED ORDER — VERAPAMIL HCL ER 240 MG PO TBCR: EXTENDED_RELEASE_TABLET | ORAL | Status: DC

## 2014-12-11 ENCOUNTER — Telehealth: Payer: Self-pay | Admitting: Internal Medicine

## 2014-12-11 ENCOUNTER — Other Ambulatory Visit: Payer: Self-pay

## 2014-12-11 MED ORDER — VERAPAMIL HCL ER 240 MG PO TBCR: EXTENDED_RELEASE_TABLET | ORAL | Status: DC

## 2014-12-11 NOTE — Telephone Encounter (Signed)
Patient is out of verapamil (CALAN-SR) 240 MG CR tablet and need a re-fill sent to [id number] - [place], [place] - [address]  Patient is scheduled for med check appointment on 12/13/14 at 0900.

## 2014-12-11 NOTE — Telephone Encounter (Signed)
Rx request for Verapamil ER 240 mg tablet-Take 1 tablet by mouth daily #30  Pharm:  [facility] [address]   Pt has upcoming appt on 4.20.2016 for a medcheck and pt will establish care in 2017 with Dr. Durene CalHunter.

## 2014-12-11 NOTE — Telephone Encounter (Signed)
Medication refilled

## 2014-12-13 ENCOUNTER — Ambulatory Visit (INDEPENDENT_AMBULATORY_CARE_PROVIDER_SITE_OTHER): Payer: Medicare Other | Admitting: Family Medicine

## 2014-12-13 ENCOUNTER — Encounter: Payer: Self-pay | Admitting: Family Medicine

## 2014-12-13 VITALS — BP 146/92 | HR 85 | Temp 97.8°F | Wt 370.0 lb

## 2014-12-13 DIAGNOSIS — N401 Enlarged prostate with lower urinary tract symptoms: Secondary | ICD-10-CM

## 2014-12-13 DIAGNOSIS — E785 Hyperlipidemia, unspecified: Secondary | ICD-10-CM | POA: Diagnosis not present

## 2014-12-13 DIAGNOSIS — I10 Essential (primary) hypertension: Secondary | ICD-10-CM

## 2014-12-13 DIAGNOSIS — N138 Other obstructive and reflux uropathy: Secondary | ICD-10-CM

## 2014-12-13 MED ORDER — HYDROCHLOROTHIAZIDE 25 MG PO TABS: 12.5000 mg | ORAL_TABLET | Freq: Every day | ORAL | Status: DC

## 2014-12-13 NOTE — Assessment & Plan Note (Addendum)
Poor control on Verapamil 240mg , terazosin 2mg . Patient denies history of gout though on problem list. Other options limited  Amlodipine-2+ edema without medicine Dribbling even on terazosin and concern worsening with diuretic Angioedema on ace. Also avoid arb May have had gout Could consider beta blocker, or titrate up terazosin  For now, we will start with HCTZ 12.5mg  and follow up in 2 weeks.   Also with HA on ROS with normal neuro exam, if this persists, may consider further workup.

## 2014-12-13 NOTE — Assessment & Plan Note (Signed)
S: compliant with atorvastatin and controlled in 2014 A/P: asked patient to return fasting at next visit and we will update lipids. Will also need all bloodwork updated including a1c and consider PSA with prostate issues.

## 2014-12-13 NOTE — Progress Notes (Signed)
Tana Conch, MD Phone: [phone]  Subjective:   Franklin Hicks is a 64 y.o. year old very pleasant male patient who presents with the following:  Hypertension-poor control on verapamil and terazosin BP Readings from Last 3 Encounters:  12/13/14 146/92  02/06/14 140/78  11/21/13 130/90   Home BP monitoring-no  Edema with venous stasis changes already Dribbling even on terazosin Angioedema on ACe-I GOut-on problem list but patient denies history  ROS-Denies any CP,  blurry vision, worsening LE edema Headaches as day goes on over last 1-2 weeks. Wonders if glasses need to be adjusted- no eye exam in several years and seems to be straining at times. Around both temples. No history of headaches. Bc powder resolves headache  Past Medical History- Patient Active Problem List   Diagnosis Date Noted  . Morbid obesity with BMI of 50.0-59.9, adult 07/18/2013    Priority: High  . Acute pulmonary embolism 07/13/2013    Priority: High  . SOB (shortness of breath) 10/02/2011    Priority: High  . Dyslipidemia 10/02/2011    Priority: Medium  . BPH with urinary obstruction 07/17/2010    Priority: Medium  . Hyperglycemia 07/17/2010    Priority: Medium  . Gout 03/15/2007    Priority: Medium  . Essential hypertension 03/15/2007    Priority: Medium  . Angioedema of lips 07/13/2013  . Multinodular goiter 07/13/2013  . CHRONIC VENOUS HYPERTENSION WITH INFLAMMATION 07/17/2010  . ROTATOR CUFF SYNDROME, RIGHT 08/21/2009  . EDEMA 12/08/2008  . HEADACHE 12/08/2008  . LEG PAIN, LEFT 11/30/2008  . BACK PAIN, LEFT 11/10/2007  . NEUROPATHY, IDIOPATHIC PERIPHERAL NEC 05/31/2007  . SYMPTOM, PAIN, ABDOMINAL, UNSPECIFIED SITE 05/31/2007  . OSTEOARTHROSIS, LOCAL NOS, LOWER LEG 03/22/2007   Medications- reviewed and updated Current Outpatient Prescriptions  Medication Sig Dispense Refill  . atorvastatin (LIPITOR) 40 MG tablet TAKE 1 TABLET (40 MG TOTAL) BY MOUTH DAILY. 90 tablet 1  . terazosin  (HYTRIN) 2 MG capsule TAKE 1 CAPSULE (2 MG TOTAL) BY MOUTH AT BEDTIME. 30 capsule 4  . verapamil (CALAN-SR) 240 MG CR tablet TAKE 1 TABLET (240 MG TOTAL) BY MOUTH DAILY. 30 tablet 5  . vitamin B-12 (CYANOCOBALAMIN) 100 MCG tablet Take 100 mcg by mouth daily.    Marland Kitchen acetaminophen (TYLENOL) 500 MG tablet Take 1 tablet (500 mg total) by mouth every 6 (six) hours as needed. (Patient not taking: Reported on 12/13/2014) 30 tablet 0   Objective: BP 146/92 mmHg  Pulse 85  Temp(Src) 97.8 F (36.6 C)  Wt 370 lb (167.831 kg) Gen: NAD, resting comfortably, walks with cane, morbidly obese CV: RRR no murmurs rubs or gallops Lungs: CTAB no crackles, wheeze, rhonchi Abdomen: soft/nontender/nondistended/normal bowel sounds. No rebound or guarding. Morbidly obese Ext: 2+ pitting edema with venous stasis changes as below  Skin: warm, dry, extensive venous stasis changes Neuro: CN II-XII intact, sensation and reflexes normal throughout, 5/5 muscle strength in bilateral upper and lower extremities. Normal finger to nose. Normal rapid alternating movements.    Assessment/Plan:  Essential hypertension Poor control on Verapamil , terazosin . Patient denies history of gout though on problem list. Other options limited  Amlodipine-2+ edema without medicine Dribbling even on terazosin and concern worsening with diuretic Angioedema on ace. Also avoid arb May have had gout Could consider beta blocker, or titrate up terazosin  For now, we will start with HCTZ 12.5mg  and follow up in 2 weeks.   Also with HA on ROS with normal neuro exam, if this persists, may consider  further workup.    BPH with urinary obstruction S: even on terazosin 2mg  has some dribbling A/P: concern hctz may worsen this so start low. Get patient to fill out AUA score next time-consider titrate terazosin vs. Add flomax.     Dyslipidemia S: compliant with atorvastatin and controlled in 2014 A/P: asked patient to return fasting at  next visit and we will update lipids. Will also need all bloodwork updated including a1c and consider PSA with prostate issues.      Return precautions advised. 2 week planned follow up.   Meds ordered this encounter  Medications  . hydrochlorothiazide (HYDRODIURIL) 25 MG tablet    Sig: Take 0.5-1 tablets (12.5-25 mg total) by mouth daily.    Dispense:  30 tablet    Refill:  5

## 2014-12-13 NOTE — Patient Instructions (Addendum)
Blood pressure elevated Start 1/2 pill of HCTZ 25mg . Continue terazosin and verapamil.  See me in 2 weeks for recheck, may use full pillin long run  Wonder if headaches are related to blood pressure. Normal neurological exam. If these worsen, please see me sooner but suspect they will improve with improved blood pressure control.   We may need to do a follow up prostate exam with your dribbling issues and consider repeat PSA or go up on terazosin at follow up.   If you can get a morning appointment, come in fasting and will likely update labs

## 2014-12-13 NOTE — Assessment & Plan Note (Signed)
S: even on terazosin 2mg  has some dribbling A/P: concern hctz may worsen this so start low. Get patient to fill out AUA score next time-consider titrate terazosin vs. Add flomax.

## 2014-12-26 ENCOUNTER — Encounter: Payer: Self-pay | Admitting: Family Medicine

## 2014-12-26 ENCOUNTER — Ambulatory Visit (INDEPENDENT_AMBULATORY_CARE_PROVIDER_SITE_OTHER): Payer: Medicare Other | Admitting: Family Medicine

## 2014-12-26 VITALS — BP 146/88 | HR 91 | Temp 98.0°F | Wt 370.0 lb

## 2014-12-26 DIAGNOSIS — N401 Enlarged prostate with lower urinary tract symptoms: Secondary | ICD-10-CM

## 2014-12-26 DIAGNOSIS — N138 Other obstructive and reflux uropathy: Secondary | ICD-10-CM

## 2014-12-26 DIAGNOSIS — Z9189 Other specified personal risk factors, not elsewhere classified: Secondary | ICD-10-CM

## 2014-12-26 DIAGNOSIS — E785 Hyperlipidemia, unspecified: Secondary | ICD-10-CM | POA: Diagnosis not present

## 2014-12-26 DIAGNOSIS — I10 Essential (primary) hypertension: Secondary | ICD-10-CM

## 2014-12-26 LAB — COMPREHENSIVE METABOLIC PANEL
ALK PHOS: 100 U/L (ref 39–117)
ALT: 31 U/L (ref 0–53)
AST: 27 U/L (ref 0–37)
Albumin: 3.6 g/dL (ref 3.5–5.2)
BUN: 25 mg/dL — ABNORMAL HIGH (ref 6–23)
CO2: 29 mEq/L (ref 19–32)
CREATININE: 1.27 mg/dL (ref 0.40–1.50)
Calcium: 8.8 mg/dL (ref 8.4–10.5)
Chloride: 103 mEq/L (ref 96–112)
GFR: 73.08 mL/min (ref >60.00)
Glucose, Bld: 136 mg/dL — ABNORMAL HIGH (ref 70–99)
Potassium: 4.1 mEq/L (ref 3.5–5.1)
Sodium: 138 mEq/L (ref 135–145)
Total Bilirubin: 0.3 mg/dL (ref 0.2–1.2)
Total Protein: 7.3 g/dL (ref 6.0–8.3)

## 2014-12-26 LAB — LIPID PANEL
CHOL/HDL RATIO: 3
Cholesterol: 164 mg/dL (ref 0–200)
HDL: 46.9 mg/dL (ref >39.00)
LDL CALC: 105 mg/dL — AB (ref 0–99)
NonHDL: 117.1
Triglycerides: 61 mg/dL (ref 0.0–149.0)
VLDL: 12.2 mg/dL (ref 0.0–40.0)

## 2014-12-26 LAB — CBC
HEMATOCRIT: 45 % (ref 39.0–52.0)
HEMOGLOBIN: 15.2 g/dL (ref 13.0–17.0)
MCHC: 33.8 g/dL (ref 30.0–36.0)
MCV: 87.5 fl (ref 78.0–100.0)
Platelets: 224 10*3/uL (ref 150.0–400.0)
RBC: 5.14 Mil/uL (ref 4.22–5.81)
RDW: 14.9 % (ref 11.5–15.5)
WBC: 7.8 10*3/uL (ref 4.0–10.5)

## 2014-12-26 LAB — PSA: PSA: 1.23 ng/mL (ref 0.10–4.00)

## 2014-12-26 LAB — HEMOGLOBIN A1C: HEMOGLOBIN A1C: 6.7 % — AB (ref 4.6–6.5)

## 2014-12-26 LAB — TSH: TSH: 2.04 u[IU]/mL (ref 0.35–4.50)

## 2014-12-26 MED ORDER — TERAZOSIN HCL 5 MG PO CAPS: 5.0000 mg | ORAL_CAPSULE | Freq: Every day | ORAL | Status: DC

## 2014-12-26 NOTE — Assessment & Plan Note (Signed)
AUA score 7, but 5 for unhappy. Titrate terazosin to 5mg  from 2mg .  Alternatively consider flomax or cialis-stopping terazosin in either case.

## 2014-12-26 NOTE — Patient Instructions (Addendum)
Increase terazosin to  to see if helps with blood pressure and prostate issues. Often requires  to really help with prostate. Can cause lightheadedness with standing so watch closely.   See me in 1 month  Update labs today including tests for diabetes and prostate blood test but need to do rectal exam next visit  Strongly encourage going to [facility]. Dash eating plan below can also help with your blood pressure  DASH Eating Plan DASH stands for "Dietary Approaches to Stop Hypertension." The DASH eating plan is a healthy eating plan that has been shown to reduce high blood pressure (hypertension). Additional health benefits may include reducing the risk of type 2 diabetes mellitus, heart disease, and stroke. The DASH eating plan may also help with weight loss. WHAT DO I NEED TO KNOW ABOUT THE DASH EATING PLAN? For the DASH eating plan, you will follow these general guidelines:  Choose foods with a percent daily value for sodium of less than 5% (as listed on the food label).  Use salt-free seasonings or herbs instead of table salt or sea salt.  Check with your health care provider or pharmacist before using salt substitutes.  Eat lower-sodium products, often labeled as "lower sodium" or "no salt added."  Eat fresh foods.  Eat more vegetables, fruits, and low-fat dairy products.  Choose whole grains. Look for the word "whole" as the first word in the ingredient list.  Choose fish and skinless chicken or [place] more often than red meat. Limit fish, poultry, and meat to 6 oz (170 g) each day.  Limit sweets, desserts, sugars, and sugary drinks.  Choose heart-healthy fats.  Limit cheese to 1 oz (28 g) per day.  Eat more home-cooked food and less restaurant, buffet, and fast food.  Limit fried foods.  Cook foods using methods other than frying.  Limit canned vegetables. If you do use them, rinse them well to decrease the sodium.  When eating at a restaurant, ask that your food  be prepared with less salt, or no salt if possible. WHAT FOODS CAN I EAT? Seek help from a dietitian for individual calorie needs. Grains Whole grain or whole wheat bread. Brown rice. Whole grain or whole wheat pasta. Quinoa, bulgur, and whole grain cereals. Low-sodium cereals. Corn or whole wheat flour tortillas. Whole grain cornbread. Whole grain crackers. Low-sodium crackers. Vegetables Fresh or frozen vegetables (raw, steamed, roasted, or grilled). Low-sodium or reduced-sodium tomato and vegetable juices. Low-sodium or reduced-sodium tomato sauce and paste. Low-sodium or reduced-sodium canned vegetables.  Fruits All fresh, canned (in natural juice), or frozen fruits. Meat and Other Protein Products Ground beef (85% or leaner), grass-fed beef, or beef trimmed of fat. Skinless chicken or [place]. Ground chicken or [place]. Pork trimmed of fat. All fish and seafood. Eggs. Dried beans, peas, or lentils. Unsalted nuts and seeds. Unsalted canned beans. Dairy Low-fat dairy products, such as skim or 1% milk, 2% or reduced-fat cheeses, low-fat ricotta or cottage cheese, or plain low-fat yogurt. Low-sodium or reduced-sodium cheeses. Fats and Oils Tub margarines without trans fats. Light or reduced-fat mayonnaise and salad dressings (reduced sodium). Avocado. Safflower, olive, or canola oils. Natural peanut or almond butter. Other Unsalted popcorn and pretzels. The items listed above may not be a complete list of recommended foods or beverages. Contact your dietitian for more options. WHAT FOODS ARE NOT RECOMMENDED? Grains White bread. White pasta. White rice. Refined cornbread. Bagels and croissants. Crackers that contain trans fat. Vegetables Creamed or fried vegetables. Vegetables in a cheese  sauce. Regular canned vegetables. Regular canned tomato sauce and paste. Regular tomato and vegetable juices. Fruits Dried fruits. Canned fruit in light or heavy syrup. Fruit juice. Meat and Other Protein  Products Fatty cuts of meat. Ribs, chicken wings, bacon, sausage, bologna, salami, chitterlings, fatback, hot dogs, bratwurst, and packaged luncheon meats. Salted nuts and seeds. Canned beans with salt. Dairy Whole or 2% milk, cream, half-and-half, and cream cheese. Whole-fat or sweetened yogurt. Full-fat cheeses or blue cheese. Nondairy creamers and whipped toppings. Processed cheese, cheese spreads, or cheese curds. Condiments Onion and garlic salt, seasoned salt, table salt, and sea salt. Canned and packaged gravies. Worcestershire sauce. Tartar sauce. Barbecue sauce. Teriyaki sauce. Soy sauce, including reduced sodium. Steak sauce. Fish sauce. Oyster sauce. Cocktail sauce. Horseradish. Ketchup and mustard. Meat flavorings and tenderizers. Bouillon cubes. Hot sauce. Tabasco sauce. Marinades. Taco seasonings. Relishes. Fats and Oils Butter, stick margarine, lard, shortening, ghee, and bacon fat. Coconut, palm kernel, or palm oils. Regular salad dressings. Other Pickles and olives. Salted popcorn and pretzels. The items listed above may not be a complete list of foods and beverages to avoid. Contact your dietitian for more information. WHERE CAN I FIND MORE INFORMATION? National Heart, Lung, and Blood Institute: CablePromo.itwww.nhlbi.nih.gov/health/health-topics/topics/dash/ Document Released: 07/31/2011 Document Revised: 12/26/2013 Document Reviewed: 06/15/2013 [facility] Patient Information 2015 [place], [place]. This information is not intended to replace advice given to you by your health care provider. Make sure you discuss any questions you have with your health care provider.

## 2014-12-26 NOTE — Progress Notes (Signed)
Tana Conch, MD  Subjective:  Franklin Hicks is a 64 y.o. year old very pleasant male patient who presents with:  Hypertension-Poor control but improvement in diastolic [employer] considering (encouraged) BPH- reasonable control with AUA 7 (modreate) but 5/6 QOL score on terazosin . 2 for urinating with 2 hours, 4 for urgency, 1 for nocturia. 5 for unhappy.  Hyperlipidemia- controlled on last check 2014 BP Readings from Last 3 Encounters:  12/26/14 146/88  12/13/14 146/92  02/06/14 140/78   Home BP monitoring-no Compliant with medications-yes without side effects, did not note increased urination on hctz ROS-Denies any CP, HA, SOB, blurry vision. Headaches overall doing better. Took bc powder once in last 2 weeks. But has been much less frequent (previously daily). Does struggle with obtaining erection  Past Medical History- gout listed in problem list but patient denies, history PE, morbid obesity, hyperglycemia  Medications- reviewed and updated Current Outpatient Prescriptions  Medication Sig Dispense Refill  . atorvastatin (LIPITOR) 40 MG tablet TAKE 1 TABLET (40 MG TOTAL) BY MOUTH DAILY. 90 tablet 1  . hydrochlorothiazide (HYDRODIURIL) 25 MG tablet Take 0.5-1 tablets (12.5-25 mg total) by mouth daily. 30 tablet 5  . terazosin (HYTRIN) 2 MG capsule TAKE 1 CAPSULE (2 MG TOTAL) BY MOUTH AT BEDTIME. 30 capsule 4  . verapamil (CALAN-SR) 240 MG CR tablet TAKE 1 TABLET (240 MG TOTAL) BY MOUTH DAILY. 30 tablet 5  . vitamin B-12 (CYANOCOBALAMIN) 100 MCG tablet Take 100 mcg by mouth daily.    Marland Kitchen acetaminophen (TYLENOL) 500 MG tablet Take 1 tablet (500 mg total) by mouth every 6 (six) hours as needed. (Patient not taking: Reported on 12/13/2014) 30 tablet 0   No current facility-administered medications for this visit.    Objective: BP 146/88 mmHg  Pulse 91  Temp(Src) 98 F (36.7 C)  Wt 370 lb (167.831 kg)  SpO2 94% Gen: NAD, resting comfortably in chair, morbidly obese, holds his  cane CV: RRR no murmurs rubs or gallops Lungs: CTAB no crackles, wheeze, rhonchi Abdomen: soft/nontender/nondistended/normal bowel sounds. No rebound or guarding.  Ext: 2+ pitting edema Skin: warm, dry, venous stasis changes Neuro: grossly normal, moves all extremities, walks with cane   Assessment/Plan:  Essential hypertension Continue Verapamil , hctz 12.5mg (hesitant to increase with BPH issues), titrate to terazosin  in hopes to help with BP and BPH. Consider  terazosin. As alternative, consider hctz , flomax and stop terazosin or cialis  daily along with hctz given concurrent ED issues.    BPH with urinary obstruction AUA score 7, but 5 for unhappy. Titrate terazosin to  from .  Alternatively consider flomax or cialis-stopping terazosin in either case.    Dyslipidemia Check lipids today. On atorvastatin , ldl 88 in 2014.    1 month follow up. AUA score, BP repeat, DRE that time.   Warned patient about potential orthostatic symptoms.   Orders Placed This Encounter  Procedures  . CBC    [place]  . Comprehensive metabolic panel    [place]    Order Specific Question:  Has the patient fasted?    Answer:  No  . Lipid panel    [place]    Order Specific Question:  Has the patient fasted?    Answer:  No  . Hemoglobin A1c    [place]  . TSH    [place]  . PSA    Meds ordered this encounter  Medications  . terazosin (HYTRIN) 5 MG capsule    Sig: Take 1 capsule (5 mg  total) by mouth at bedtime. TAKE 1 CAPSULE (2 MG TOTAL) BY MOUTH AT BEDTIME.    Dispense:  30 capsule    Refill:  5

## 2014-12-26 NOTE — Assessment & Plan Note (Signed)
Continue Verapamil 240mg , hctz 12.5mg (hesitant to increase with BPH issues), titrate to terazosin 5mg  in hopes to help with BP and BPH. Consider 10mg  terazosin. As alternative, consider hctz 25mg , flomax and stop terazosin or cialis 5mg  daily along with hctz given concurrent ED issues.

## 2014-12-26 NOTE — Assessment & Plan Note (Signed)
Check lipids today. On atorvastatin 40mg , ldl 88 in 2014.

## 2015-01-26 ENCOUNTER — Ambulatory Visit: Payer: Medicare Other | Admitting: Family Medicine

## 2015-02-12 ENCOUNTER — Telehealth: Payer: Self-pay

## 2015-02-12 MED ORDER — ATORVASTATIN CALCIUM 40 MG PO TABS: ORAL_TABLET | ORAL | Status: DC

## 2015-02-12 NOTE — Telephone Encounter (Signed)
[id number] - [place],  - [address]: atorvastatin (LIPITOR) 40 MG tablet

## 2015-02-12 NOTE — Telephone Encounter (Signed)
Medication refilled

## 2015-02-15 ENCOUNTER — Encounter: Payer: Self-pay | Admitting: Family Medicine

## 2015-02-15 ENCOUNTER — Ambulatory Visit (INDEPENDENT_AMBULATORY_CARE_PROVIDER_SITE_OTHER): Payer: Medicare Other | Admitting: Family Medicine

## 2015-02-15 VITALS — BP 138/84 | HR 88 | Temp 97.8°F | Wt 370.0 lb

## 2015-02-15 DIAGNOSIS — N401 Enlarged prostate with lower urinary tract symptoms: Secondary | ICD-10-CM

## 2015-02-15 DIAGNOSIS — I10 Essential (primary) hypertension: Secondary | ICD-10-CM | POA: Diagnosis not present

## 2015-02-15 DIAGNOSIS — Z6841 Body Mass Index (BMI) 40.0 and over, adult: Secondary | ICD-10-CM

## 2015-02-15 DIAGNOSIS — E785 Hyperlipidemia, unspecified: Secondary | ICD-10-CM

## 2015-02-15 DIAGNOSIS — N138 Other obstructive and reflux uropathy: Secondary | ICD-10-CM

## 2015-02-15 DIAGNOSIS — R739 Hyperglycemia, unspecified: Secondary | ICD-10-CM | POA: Diagnosis not present

## 2015-02-15 NOTE — Assessment & Plan Note (Signed)
S: AUA down to 1 (urinate within 2 hours of finishing) with quality of life 1.  Much improved A/P: considerable improvement- continue terazosin 5mg , also helping BP

## 2015-02-15 NOTE — Patient Instructions (Addendum)
Blood pressure looks much better  Urinary symptoms improved as well  Really pleased with this progress  High risk diabetes. If numbers remain high in 6 months, we will need to officially label this diabetes  Let's decide on labs on the day you come back. Some of your labs we cannot do until a full year from may  Continue current medications  Let's do a physical in 6 months. If you can't get in until 7 or 8 months then see me about halfway in between

## 2015-02-15 NOTE — Assessment & Plan Note (Signed)
S: controlled today with increased terazosin to 5mg  from 2mg . Continued use of Verapamil 240mg , hctz 12.5mg  without any gout flares.  A/P: continue current rx.

## 2015-02-15 NOTE — Progress Notes (Signed)
Tana Conch, MD  Subjective:  Franklin Hicks is a 64 y.o. year old very pleasant male patient who presents with:  See problem oriented charting ROS- no chest pain, shortness of breath. Improved polyuria. No polydipsia. No palpitations. No myalgias.   Past Medical History- history of PE, history of goiter  Medications- reviewed and updated Current Outpatient Prescriptions  Medication Sig Dispense Refill  . atorvastatin (LIPITOR) 40 MG tablet TAKE 1 TABLET (40 MG TOTAL) BY MOUTH DAILY. 90 tablet 2  . hydrochlorothiazide (HYDRODIURIL) 25 MG tablet Take 0.5-1 tablets (12.5-25 mg total) by mouth daily. 30 tablet 5  . terazosin (HYTRIN) 5 MG capsule Take 1 capsule (5 mg total) by mouth at bedtime. TAKE 1 CAPSULE (2 MG TOTAL) BY MOUTH AT BEDTIME. 30 capsule 5  . verapamil (CALAN-SR) 240 MG CR tablet TAKE 1 TABLET (240 MG TOTAL) BY MOUTH DAILY. 30 tablet 5  . vitamin B-12 (CYANOCOBALAMIN) 100 MCG tablet Take 100 mcg by mouth daily.    Marland Kitchen acetaminophen (TYLENOL) 500 MG tablet Take 1 tablet (500 mg total) by mouth every 6 (six) hours as needed. (Patient not taking: Reported on 12/13/2014) 30 tablet 0    Objective: BP 138/84 mmHg  Pulse 88  Temp(Src) 97.8 F (36.6 C)  Wt 370 lb (167.831 kg) Gen: NAD, resting comfortably CV: RRR no murmurs rubs or gallops Lungs: CTAB no crackles, wheeze, rhonchi Abdomen: soft/nontender/nondistended/normal bowel sounds. No rebound or guarding. Morbidly obese Ext: 1+ edema L >R slightly Skin: warm, dry, no rash  Neuro: grossly normal, moves all extremities   Assessment/Plan:  Hyperglycemia S: we had a long discussion today that based off a1c and last CBG we could technically call this diabetes but he would not require medicine. Patient asks for some time to work on diet/exercise before we officially label this.  A/P: Patient to continue 5x a week [employer] water exercise as recently started, cut fried food, cut late night snacking. I am suspicious this will not be  enough. If a1c 6.5-7, believe diabetic counseling initial step though we also discussed bypass as an option.    Morbid obesity with BMI of 50.0-59.9, adult S: BMI remains over 50. Has increased exercise but still has many unhealthy habits A/P: Encouraged need for healthy eating, regular exercise, weight loss.  See hyperglycemia discussion    Essential hypertension S: controlled today with increased terazosin to 5mg  from 2mg . Continued use of Verapamil 240mg , hctz 12.5mg  without any gout flares.  A/P: continue current rx.    BPH with urinary obstruction S: AUA down to 1 (urinate within 2 hours of finishing) with quality of life 1.  Much improved A/P: considerable improvement- continue terazosin 5mg , also helping BP  Dyslipidemia S: lipids previously controlled on atorvastatin 20mg  but has gained another 20 lbs. Mild poor control at present Lab Results  Component Value Date   CHOL 164 12/26/2014   HDL 46.90 12/26/2014   LDLCALC 105* 12/26/2014   TRIG 61.0 12/26/2014   CHOLHDL 3 12/26/2014  A/P:  Advised weight loss, may have to increase lipitor to full tab if cannot complete this goal   Rectal next visit. Need to review full history. See SOB- may need repeat cards visit.

## 2015-02-15 NOTE — Assessment & Plan Note (Signed)
S: we had a long discussion today that based off a1c and last CBG we could technically call this diabetes but he would not require medicine. Patient asks for some time to work on diet/exercise before we officially label this.  A/P: Patient to continue 5x a week [employer] water exercise as recently started, cut fried food, cut late night snacking. I am suspicious this will not be enough. If a1c 6.5-7, believe diabetic counseling initial step though we also discussed bypass as an option.

## 2015-02-15 NOTE — Assessment & Plan Note (Signed)
S: BMI remains over 50. Has increased exercise but still has many unhealthy habits A/P: Encouraged need for healthy eating, regular exercise, weight loss.  See hyperglycemia discussion

## 2015-02-15 NOTE — Assessment & Plan Note (Signed)
S: lipids previously controlled on atorvastatin 20mg  but has gained another 20 lbs. Mild poor control at present Lab Results  Component Value Date   CHOL 164 12/26/2014   HDL 46.90 12/26/2014   LDLCALC 105* 12/26/2014   TRIG 61.0 12/26/2014   CHOLHDL 3 12/26/2014  A/P:  Advised weight loss, may have to increase lipitor to full tab if cannot complete this goal

## 2015-05-24 ENCOUNTER — Telehealth: Payer: Self-pay | Admitting: Family Medicine

## 2015-05-24 MED ORDER — ATORVASTATIN CALCIUM 40 MG PO TABS: ORAL_TABLET | ORAL | Status: DC

## 2015-05-24 NOTE — Telephone Encounter (Signed)
Medication refilled

## 2015-05-24 NOTE — Telephone Encounter (Signed)
[id number] [place], [place] - [address] [phone]  Requesting refill of atorvastatin (LIPITOR) 40 MG tablet

## 2015-05-31 ENCOUNTER — Telehealth: Payer: Self-pay | Admitting: Family Medicine

## 2015-05-31 ENCOUNTER — Ambulatory Visit (INDEPENDENT_AMBULATORY_CARE_PROVIDER_SITE_OTHER): Payer: Medicare Other | Admitting: Family Medicine

## 2015-05-31 VITALS — BP 130/80 | HR 110 | Temp 98.2°F | Ht 72.0 in | Wt 375.9 lb

## 2015-05-31 DIAGNOSIS — L03116 Cellulitis of left lower limb: Secondary | ICD-10-CM

## 2015-05-31 MED ORDER — CEPHALEXIN 500 MG PO CAPS: 500.0000 mg | ORAL_CAPSULE | Freq: Three times a day (TID) | ORAL | Status: DC

## 2015-05-31 NOTE — Telephone Encounter (Signed)
Noted  

## 2015-05-31 NOTE — Telephone Encounter (Signed)
[facility] - Client TELEPHONE ADVICE RECORD [facility] Patient Name: Franklin Hicks Gender: Male DOB: 10/18/1950 Age: 64 Y 46 M 3 D Return Phone Number: [phone] (Primary), [phone] (Secondary) Address: [address] City/State/Zip: [place] [address] Client [facility] - Client Client Site [facility] - Day Physician Tana Conch Contact Type Call Call Type Triage / Clinical Relationship To Patient Self Appointment Disposition EMR Appointment Scheduled Info pasted into Epic Yes Return Phone Number [phone] (Primary) Chief Complaint Leg Pain Initial Comment Caller states, since Monday his lower Lt leg was sore to the touch, very tender. Recent surgery. Previous blood clot issues as well. PreDisposition Call Doctor Nurse Assessment Nurse: Roderic Ovens, RN, Amy Date/Time Lamount Cohen Time): 05/31/2015 11:35:19 AM Confirm and document reason for call. If symptomatic, describe symptoms. ---TENDER SPOT ON LOWER LEFT LEG. IT IS RED THAT HE NOTICED YESTERDAY. TENDER EARLIER THE FIRST PART OF THE WEEK. IT IS RED IN COLOR. LEFT LEG HAS ALWAYS BEEN LARGER THAN RIGHT. HIS FOOT - LEFT LEG IS SWOLLEN. BLOOD CLOT IN LUNGS. BABY ASA DAILY. Has the patient traveled out of the country within the last 30 days? ---Not Applicable Does the patient have any new or worsening symptoms? ---Yes Will a triage be completed? ---Yes Related visit to physician within the last 2 weeks? ---No Does the PT have any chronic conditions? (i.e. diabetes, asthma, etc.) ---No Guidelines Guideline Title Affirmed Question Affirmed Notes Nurse Date/Time Lamount Cohen Time) Leg Pain [1] Thigh, calf, or ankle swelling AND [2] bilateral AND [3] 1 side is more swollen no chest pain or sob - swelling to the leg, tenderness in one spot. [place], RN, Amy 05/31/2015 11:36:13 AM Disp. Time Lamount Cohen Time) Disposition Final  User 05/31/2015 11:41:19 AM See Physician within 4 Hours (or PCP triage) Yes Roderic Ovens, RN, Amy

## 2015-05-31 NOTE — Progress Notes (Signed)
   Subjective:    Patient ID: Franklin Hicks, male    DOB: 10-18-1950, 64 y.o.   MRN: [id number]  HPI Patient seen with several day history of progressive swelling, redness, warmth left lower leg. Denies any injury. He has history of diabetes which apparently has been fairly well controlled without medication. He had some chills a few days ago but no documented fever. He does have remote history of pulmonary embolus. Has not noticed any calf or thigh tenderness. No dyspnea.  Chronic medical problems include morbid obesity, diabetes, hypertension, BPH, osteoarthritis  Past Medical History  Diagnosis Date  . Gout   . Hypertension   . DJD (degenerative joint disease)   . Obesity    Past Surgical History  Procedure Laterality Date  . US echocardiography  2004    no lvh nl ejection fraction  . Ureteral stone  [id number]  . Left knee open meniscetomy      reports that he quit smoking about 34 years ago. His smoking use included Cigarettes. He has never used smokeless tobacco. He reports that he does not drink alcohol or use illicit drugs. family history includes Liver disease in his mother. Allergies  Allergen Reactions  . Ace Inhibitors     Angioedema  . Other Hives and Itching    msg      Review of Systems  Constitutional: Positive for chills. Negative for fever.  Respiratory: Negative for shortness of breath.   Cardiovascular: Positive for leg swelling. Negative for chest pain and palpitations.  Gastrointestinal: Negative for nausea and vomiting.       Objective:   Physical Exam  Constitutional: He appears well-developed and well-nourished.  Cardiovascular: Normal rate and regular rhythm.   Pulmonary/Chest: Effort normal and breath sounds normal. No respiratory distress. He has no wheezes. He has no rales.  Musculoskeletal: He exhibits edema.  Skin:  Left leg from two thirds the way down the leg to the ankle- erythema mild warmth and mild tenderness laterally.  erythema  involves the anterior and lateral aspect of the leg but not medially. No calf tenderness. Feet are warm to touch with good distal pulses          Assessment & Plan:  Cellulitis left leg. He does not have any fever or indication for sepsis. Start Keflex 500 mgs 3 times a day for 10 days. Frequent elevation. Follow-up with primary next week to reassess. Follow-up immediately for any fever or signs of progressive infection

## 2015-05-31 NOTE — Patient Instructions (Signed)

## 2015-05-31 NOTE — Progress Notes (Signed)
Pre visit review using our clinic review tool, if applicable. No additional management support is needed unless otherwise documented below in the visit note. 

## 2015-06-06 ENCOUNTER — Ambulatory Visit (INDEPENDENT_AMBULATORY_CARE_PROVIDER_SITE_OTHER): Payer: Medicare Other | Admitting: Family Medicine

## 2015-06-06 ENCOUNTER — Encounter: Payer: Self-pay | Admitting: Family Medicine

## 2015-06-06 VITALS — BP 140/80 | HR 110 | Temp 98.2°F | Wt 376.0 lb

## 2015-06-06 DIAGNOSIS — L03116 Cellulitis of left lower limb: Secondary | ICD-10-CM | POA: Diagnosis not present

## 2015-06-06 NOTE — Progress Notes (Signed)
Pre visit review using our clinic review tool, if applicable. No additional management support is needed unless otherwise documented below in the visit note. 

## 2015-06-06 NOTE — Progress Notes (Signed)
   Subjective:    Patient ID: Franklin SpareCarl J Abrams, male    DOB: 01/17/1951, 64 y.o.   MRN: [id number]  HPI Follow-up cellulitis left leg. Patient started Keflex 5 days ago. No fevers or chills. He has noted less redness and less warmth. Also less tender. He denies any side effects from medications. He's been elevating this frequently. Ambulating without difficulty. No history of diabetes. No peripheral vascular disease.  Past Medical History  Diagnosis Date  . Gout   . Hypertension   . DJD (degenerative joint disease)   . Obesity    Past Surgical History  Procedure Laterality Date  . [place] echocardiography  2004    no lvh nl ejection fraction  . Ureteral stone  [id number]  . Left knee open meniscetomy      reports that he quit smoking about 34 years ago. His smoking use included Cigarettes. He has never used smokeless tobacco. He reports that he does not drink alcohol or use illicit drugs. family history includes Liver disease in his mother. Allergies  Allergen Reactions  . Ace Inhibitors     Angioedema  . Other Hives and Itching    msg      Review of Systems  Constitutional: Negative for fever and chills.       Objective:   Physical Exam  Constitutional: He appears well-developed and well-nourished.  Cardiovascular: Normal rate and regular rhythm.   Pulmonary/Chest: Effort normal and breath sounds normal. No respiratory distress. He has no wheezes. He has no rales.  Musculoskeletal: He exhibits no edema.  Skin:  Left leg-erythema almost completely resolved. No increased edema. Nontender. Slightly warm to touch compared to the right. No open wounds.          Assessment & Plan:  Cellulitis left leg. Much improved. Continue elevation. Finish out Keflex. Follow-up for any signs of recurrence.

## 2015-06-06 NOTE — Patient Instructions (Signed)
Finish out antibiotic Continue with frequent elevation. Follow up for any recurrent redness or fever.

## 2015-06-23 ENCOUNTER — Other Ambulatory Visit: Payer: Self-pay | Admitting: Family Medicine

## 2015-06-25 ENCOUNTER — Telehealth: Payer: Self-pay | Admitting: Family Medicine

## 2015-06-25 NOTE — Telephone Encounter (Signed)
Pharmacy and pt notified

## 2015-06-25 NOTE — Telephone Encounter (Signed)
 5mg  please- please update rx and send to pharmacy. Call and apologize for confusion. Sorry!

## 2015-06-25 NOTE — Telephone Encounter (Signed)
See below

## 2015-06-25 NOTE — Telephone Encounter (Signed)
Pharm called to clarify instructions for  terazosin (HYTRIN) 5 MG capsule 30 capsule 5 06/25/2015     Sig: TAKE 1 CAPSULE (5 MG TOTAL) BY MOUTH AT BEDTIME. TAKE 1 CAPSULE (2 MG TOTAL) BY MOUTH AT BEDTIME    Please see 5 mg at bedtime and 2 mg at bedtime. Pharm would like a call back

## 2015-07-19 ENCOUNTER — Other Ambulatory Visit: Payer: Self-pay | Admitting: Family Medicine

## 2015-07-30 ENCOUNTER — Encounter: Payer: Self-pay | Admitting: Family Medicine

## 2015-07-30 ENCOUNTER — Ambulatory Visit (INDEPENDENT_AMBULATORY_CARE_PROVIDER_SITE_OTHER): Payer: Medicare Other | Admitting: Family Medicine

## 2015-07-30 ENCOUNTER — Other Ambulatory Visit: Payer: Self-pay | Admitting: Family Medicine

## 2015-07-30 VITALS — BP 144/84 | Temp 97.7°F | Wt 376.0 lb

## 2015-07-30 DIAGNOSIS — Z0000 Encounter for general adult medical examination without abnormal findings: Secondary | ICD-10-CM | POA: Diagnosis not present

## 2015-07-30 DIAGNOSIS — N138 Other obstructive and reflux uropathy: Secondary | ICD-10-CM

## 2015-07-30 DIAGNOSIS — E119 Type 2 diabetes mellitus without complications: Secondary | ICD-10-CM

## 2015-07-30 DIAGNOSIS — N529 Male erectile dysfunction, unspecified: Secondary | ICD-10-CM | POA: Insufficient documentation

## 2015-07-30 DIAGNOSIS — M7989 Other specified soft tissue disorders: Secondary | ICD-10-CM

## 2015-07-30 DIAGNOSIS — I10 Essential (primary) hypertension: Secondary | ICD-10-CM | POA: Diagnosis not present

## 2015-07-30 DIAGNOSIS — Z23 Encounter for immunization: Secondary | ICD-10-CM

## 2015-07-30 DIAGNOSIS — Z20828 Contact with and (suspected) exposure to other viral communicable diseases: Secondary | ICD-10-CM

## 2015-07-30 DIAGNOSIS — N401 Enlarged prostate with lower urinary tract symptoms: Secondary | ICD-10-CM

## 2015-07-30 LAB — HEMOGLOBIN A1C: Hgb A1c MFr Bld: 6.4 % (ref 4.6–6.5)

## 2015-07-30 MED ORDER — TERAZOSIN HCL 10 MG PO CAPS: 10.0000 mg | ORAL_CAPSULE | Freq: Every day | ORAL | Status: DC

## 2015-07-30 NOTE — Assessment & Plan Note (Signed)
S: patient symptoms previously improved on terazosin. At present having issues with urgency despite stream being reasonable.  A/P: Titrate to 10mg  terazosin which hopefully helps with BP as well. May need to switch to flomax or cialis. AUA score at follow up . Warned of orthostatic hypotension

## 2015-07-30 NOTE — Assessment & Plan Note (Signed)
S: reasonably controlled. On no medication CBGs- does not check Exercise and diet- weight stable since last visit. Tried water aerobics but cellulitis set him back. overeating  Lab Results  Component Value Date   HGBA1C 6.7* 12/26/2014   HGBA1C 6.3 11/21/2013   HGBA1C 6.4* 07/17/2010   A/P: advised diabetes education- patient declines. Did some education in room. Discussed could use medication at this point. Patient really wants to work on weight and healthy eating first. We will check in 1 month on his progress in this and can refer at that time. Check a1c today- if above 7 would advocate again for metformin.

## 2015-07-30 NOTE — Progress Notes (Signed)
Tana Conch, MD Phone: [phone]  Subjective:  Patient presents today for their annual wellness visit.    Preventive Screening-Counseling & Management  Smoking Status: former Smoker quit in 1978 Second Hand Smoking status: No smokers in home  Risk Factors Regular exercise: trying water aerobics Diet: overeats  Fall Risk: None   Cardiac risk factors:  advanced age (older than 37 for men, 42 for women) yes Hyperlipidemia -mild poor control on atorvastatin  Lab Results  Component Value Date   CHOL 164 12/26/2014   HDL 46.90 12/26/2014   LDLCALC 105* 12/26/2014   TRIG 61.0 12/26/2014   CHOLHDL 3 12/26/2014  Recent diagnosis of diabetes Family History: no   Depression Screen None. PHQ2 0   Activities of Daily Living Independent ADLs and IADLs   Hearing Difficulties: -patient declines  Cognitive Testing No reported trouble.   Normal 3 word recall  List the Names of Other Physician/Practitioners you currently use: 1.Declines any 2. Optho - reading glasses  Immunization History  Administered Date(s) Administered  . Influenza Split 09/15/2011, 07/15/2012  . Influenza Whole 06/25/1998, 05/31/2007, 07/17/2010  . Influenza,inj,Quad PF,36+ Mos 07/14/2013, 07/30/2015  . Td 08/26/1999, 12/08/2008   Required Immunizations needed today : declines pneumonia shot  Screening tests- needs below  Health Maintenance Due  Topic Date Due  . Hepatitis C Screening - today Dec 24, 1950   ROS- No pertinent positives discovered in course of AWV. Some problem oriented issues noted in separate note  The following were reviewed and entered/updated in epic: Past Medical History  Diagnosis Date  . Gout   . Hypertension   . DJD (degenerative joint disease)   . Obesity    Patient Active Problem List   Diagnosis Date Noted  . Morbid obesity with BMI of 50.0-59.9, adult (HCC) 07/18/2013    Priority: High  . Acute pulmonary embolism (HCC) 07/13/2013   Priority: High  . SOB (shortness of breath) 10/02/2011    Priority: High  . Hyperglycemia 07/17/2010    Priority: High  . Angioedema of lips 07/13/2013    Priority: Medium  . Dyslipidemia 10/02/2011    Priority: Medium  . BPH with urinary obstruction 07/17/2010    Priority: Medium  . Gout 03/15/2007    Priority: Medium  . Essential hypertension 03/15/2007    Priority: Medium  . Osteoarthritis 03/22/2007    Priority: Low  . Multinodular goiter 07/13/2013  . EDEMA 12/08/2008  . NEUROPATHY, IDIOPATHIC PERIPHERAL NEC 05/31/2007   Past Surgical History  Procedure Laterality Date  . US echocardiography  2004    no lvh nl ejection fraction  . Ureteral stone  [id number]  . Left knee open meniscetomy      Family History  Problem Relation Age of Onset  . Liver disease Mother     Medications- reviewed and updated Current Outpatient Prescriptions  Medication Sig Dispense Refill  . atorvastatin (LIPITOR) 40 MG tablet TAKE 1 TABLET (40 MG TOTAL) BY MOUTH DAILY. 90 tablet 2  . hydrochlorothiazide (HYDRODIURIL) 25 MG tablet Take 0.5-1 tablets (12.5-25 mg total) by mouth daily. 30 tablet 5  . terazosin (HYTRIN) 5 MG capsule TAKE 1 CAPSULE (5 MG TOTAL) BY MOUTH AT BEDTIME. TAKE 1 CAPSULE (2 MG TOTAL) BY MOUTH AT BEDTIME. 30 capsule 5  . verapamil (CALAN-SR) 240 MG CR tablet TAKE 1 TABLET (240 MG TOTAL) BY MOUTH DAILY. 30 tablet 5  . vitamin B-12 (CYANOCOBALAMIN) 100 MCG tablet Take 100 mcg by mouth daily.    Marland Kitchen acetaminophen (TYLENOL) 500 MG tablet Take  1 tablet (500 mg total) by mouth every 6 (six) hours as needed. (Patient not taking: Reported on 07/30/2015) 30 tablet 0   No current facility-administered medications for this visit.    Allergies-reviewed and updated Allergies  Allergen Reactions  . Ace Inhibitors     Angioedema  . Other Hives and Itching    msg    Social History   Social History  . Marital Status: Married    Spouse Name: N/A  . Number of Children: N/A  .  Years of Education: N/A   Occupational History  . teacher    Social History Main Topics  . Smoking status: Former Smoker    Types: Cigarettes    Quit date: 07/13/1980  . Smokeless tobacco: Never Used  . Alcohol Use: No  . Drug Use: No  . Sexual Activity: No   Other Topics Concern  . Not on file   Social History Narrative    Objective: BP 144/84 mmHg  Temp(Src) 97.7 F (36.5 C)  Wt 376 lb (170.552 kg) No exam included in AWV  Assessment/Plan:  AWV completed Given advanced directives packet

## 2015-07-30 NOTE — Patient Instructions (Addendum)
Flu shot received today.  We will call you within a few days about your referral for ultrasound of left leg to make sure no clots. If you do not hear within 2 days, give us a call.   Increase terazosin to 10mg . This should help with urinary issues as well as blood pressure. Lightheadedness with standing can occur and if more than mild-give me a call  Check a1c before you go. This gives us an average sugar over 3 months. a1c of 7 means average sugar of 150. Yours was 6.7 which is probably around 135 with a normal person being around 100 average. A1c of 5.6 or less means no diabetes. A1c 5.7-6.4 at risk for diabetes. 6.5 or above is diabetes  I want to see you back within a month to recheck your blood pressure and urinary symptoms. I am also going to consider a short course of water pills but I am hesitant with your current urinary symptoms.    [name] , Thank you for taking time to come for your Medicare Wellness Visit. I appreciate your ongoing commitment to your health goals. Please review the following plan we discussed and let me know if I can assist you in the future.   These are the goals we discussed: See above Declined diabetes education    This is a list of the screening recommended for you and due dates:  Health Maintenance  Topic Date Due  .  Hepatitis C: One time screening is recommended by Center for Disease Control  (CDC) for  adults born from [birth year] through 1967.   [birth year]  . Complete foot exam   06/28/1959  . Eye exam for diabetics  06/28/1959  . Urine Protein Check  06/28/1959  . Hemoglobin A1C  06/28/2015  . Colon Cancer Screening  12/26/2015*  . Shingles Vaccine  12/26/2019*  . Pneumonia vaccines (1 of 2 - PCV13) 12/26/2019*  . Flu Shot  03/25/2016  . Tetanus Vaccine  12/09/2018  *Topic was postponed. The date shown is not the original due date.

## 2015-07-30 NOTE — Assessment & Plan Note (Signed)
S: poorly controlled. On Verapamil 240mg , hctz 12.5mg  (hesitant to increase with BPH issues), terazosin 5mg   BP Readings from Last 3 Encounters:  07/30/15 144/84  06/06/15 140/80  05/31/15 130/80  A/P:Continue current meds:  But increase terazosin to 10mg . Considered trial chlorthalidone 12.5mg  instead of hctz.

## 2015-07-30 NOTE — Progress Notes (Signed)
Tana Conch, MD  Subjective:  Franklin Hicks is a 64 y.o. year old very pleasant male patient who presents for/with See problem oriented charting ROS- urinary urgency. Stream has improved. baseline SOB with Obesity. No chest pain.   Past Medical History-  Patient Active Problem List   Diagnosis Date Noted  . Morbid obesity with BMI of 50.0-59.9, adult (HCC) 07/18/2013    Priority: High  . Acute pulmonary embolism (HCC) 07/13/2013    Priority: High  . SOB (shortness of breath) 10/02/2011    Priority: High  . Diabetes mellitus type 2, controlled (HCC) 07/17/2010    Priority: High  . Angioedema of lips 07/13/2013    Priority: Medium  . Dyslipidemia 10/02/2011    Priority: Medium  . BPH with urinary obstruction 07/17/2010    Priority: Medium  . Gout 03/15/2007    Priority: Medium  . Essential hypertension 03/15/2007    Priority: Medium  . Erectile dysfunction 07/30/2015    Priority: Low  . Osteoarthritis 03/22/2007    Priority: Low  . Multinodular goiter 07/13/2013  . EDEMA 12/08/2008  . NEUROPATHY, IDIOPATHIC PERIPHERAL NEC 05/31/2007    Medications- reviewed and updated Current Outpatient Prescriptions  Medication Sig Dispense Refill  . atorvastatin (LIPITOR) 40 MG tablet TAKE 1 TABLET (40 MG TOTAL) BY MOUTH DAILY. 90 tablet 2  . hydrochlorothiazide (HYDRODIURIL) 25 MG tablet Take 0.5-1 tablets (12.5-25 mg total) by mouth daily. 30 tablet 5  . terazosin (HYTRIN) 5 MG capsule Take 1 capsule (  total) by mouth at bedtime. 30 capsule 5  . verapamil (CALAN-SR) 240 MG CR tablet TAKE 1 TABLET (240 MG TOTAL) BY MOUTH DAILY. 30 tablet 5  . vitamin B-12 (CYANOCOBALAMIN) 100 MCG tablet Take 100 mcg by mouth daily.    Marland Kitchen acetaminophen (TYLENOL) 500 MG tablet Take 1 tablet (500 mg total) by mouth every 6 (six) hours as needed. (Patient not taking: Reported on 07/30/2015) 30 tablet 0   No current facility-administered medications for this visit.    Objective: BP 144/84 mmHg   Temp(Src) 97.7 F (36.5 C)  Wt 376 lb (170.552 kg) Gen: NAD, resting comfortably CV: RRR no murmurs rubs or gallops Lungs: CTAB no crackles, wheeze, rhonchi Abdomen: soft/nontender/nondistended/normal bowel sounds. No rebound or guarding. Morbidly obese Ext: 1+ pitting on R leg, 2+ pitting on right and 10 cm tibial plateau >4 cm larger on left compared to right. Chronic venous stasis changes Skin: warm, dry Neuro: grossly normal, moves all extremities  Assessment/Plan:  Left leg edema S: after cellulitis, Left leg has remained swollen >> R. No fever, erythema other than venous stasis changes which are chronic.  A/P: with history of PE, must get venous duplex to rule out DVT. Consider lasix at follow up short term if urinary symptoms have improved by taht time.   Diabetes mellitus type 2, controlled (HCC) S: reasonably controlled. On no medication CBGs- does not check Exercise and diet- weight stable since last visit. Tried water aerobics but cellulitis set him back. overeating  Lab Results  Component Value Date   HGBA1C 6.7* 12/26/2014   HGBA1C 6.3 11/21/2013   HGBA1C 6.4* 07/17/2010   A/P: advised diabetes education- patient declines. Did some education in room. Discussed could use medication at this point. Patient really wants to work on weight and healthy eating first. We will check in 1 month on his progress in this and can refer at that time. Check a1c today- if above 7 would advocate again for metformin.  BPH with urinary obstruction S: patient symptoms previously improved on terazosin. At present having issues with urgency despite stream being reasonable.  A/P: Titrate to 10mg  terazosin which hopefully helps with BP as well. May need to switch to flomax or cialis. AUA score at follow up . Warned of orthostatic hypotension   Essential hypertension S: poorly controlled. On Verapamil 240mg , hctz 12.5mg  (hesitant to increase with BPH issues), terazosin 5mg   BP Readings  from Last 3 Encounters:  07/30/15 144/84  06/06/15 140/80  05/31/15 130/80  A/P:Continue current meds:  But increase terazosin to 10mg . Considered trial chlorthalidone 12.5mg  instead of hctz.     1 month BP and urinary follow up. Also offered urology but patient wants to try current changes first.   Return precautions advised.   Orders Placed This Encounter  Procedures  . Flu Vaccine QUAD 36+ mos IM  . Hemoglobin A1c    [facility]  . Hepatitis C antibody, reflex    [facility]    Meds ordered this encounter  Medications  . terazosin (HYTRIN) 10 MG capsule    Sig: Take 1 capsule (10 mg total) by mouth at bedtime.    Dispense:  30 capsule    Refill:  5

## 2015-07-31 ENCOUNTER — Ambulatory Visit (HOSPITAL_COMMUNITY)
Admission: RE | Admit: 2015-07-31 | Discharge: 2015-07-31 | Disposition: A | Discharge status: Home / Self Care | Payer: Medicare Other | Source: Ambulatory Visit | Attending: Cardiology | Admitting: Cardiology

## 2015-07-31 DIAGNOSIS — E785 Hyperlipidemia, unspecified: Secondary | ICD-10-CM | POA: Insufficient documentation

## 2015-07-31 DIAGNOSIS — Z87891 Personal history of nicotine dependence: Secondary | ICD-10-CM | POA: Insufficient documentation

## 2015-07-31 DIAGNOSIS — I10 Essential (primary) hypertension: Secondary | ICD-10-CM | POA: Insufficient documentation

## 2015-07-31 DIAGNOSIS — R938 Abnormal findings on diagnostic imaging of other specified body structures: Secondary | ICD-10-CM | POA: Insufficient documentation

## 2015-07-31 DIAGNOSIS — M7989 Other specified soft tissue disorders: Secondary | ICD-10-CM

## 2015-07-31 DIAGNOSIS — E119 Type 2 diabetes mellitus without complications: Secondary | ICD-10-CM | POA: Insufficient documentation

## 2015-07-31 LAB — HEPATITIS C ANTIBODY: HCV Ab: NEGATIVE

## 2015-08-02 ENCOUNTER — Encounter (HOSPITAL_COMMUNITY): Payer: Medicare Other

## 2015-08-30 ENCOUNTER — Encounter: Payer: Self-pay | Admitting: Family Medicine

## 2015-08-30 ENCOUNTER — Ambulatory Visit (INDEPENDENT_AMBULATORY_CARE_PROVIDER_SITE_OTHER): Payer: Medicare Other | Admitting: Family Medicine

## 2015-08-30 VITALS — BP 142/80 | HR 98 | Temp 97.7°F | Wt 394.0 lb

## 2015-08-30 DIAGNOSIS — R600 Localized edema: Secondary | ICD-10-CM | POA: Diagnosis not present

## 2015-08-30 DIAGNOSIS — N401 Enlarged prostate with lower urinary tract symptoms: Secondary | ICD-10-CM | POA: Diagnosis not present

## 2015-08-30 DIAGNOSIS — I10 Essential (primary) hypertension: Secondary | ICD-10-CM

## 2015-08-30 DIAGNOSIS — N138 Other obstructive and reflux uropathy: Secondary | ICD-10-CM

## 2015-08-30 DIAGNOSIS — E119 Type 2 diabetes mellitus without complications: Secondary | ICD-10-CM | POA: Diagnosis not present

## 2015-08-30 NOTE — Progress Notes (Signed)
Pre visit review using our clinic review tool, if applicable. No additional management support is needed unless otherwise documented below in the visit note. 

## 2015-08-30 NOTE — Assessment & Plan Note (Addendum)
S: long term issues with edema. Since cellulitis L >R more pronounced. U/s last visit revealed no DVT fortunately. Swelling has not worsened in leg but feels greater in foot and knee. R leg is 48 cm and left is 52 cm 10 cm below tibial pleateau. Weight is up which can worsen symptoms. Also has history of left knee surgery A/P: recommended echocardiogram as none done recently. Would not be surprised by diastolic CHF which would be improved with lasix. Patient declines but will reconsider at follow up. Could also consider refer back to cards for prior noted SOB- see subsection. Consider updating bloodwork at follow up. Also likely strong component venous insufficiency.

## 2015-08-30 NOTE — Progress Notes (Signed)
Tana ConchStephen Francelia Mclaren, MD  Subjective:  Franklin Hicks is a 65 y.o. year old very pleasant male patient who presents for/with See problem oriented charting ROS- denies increasing sob (has at baseline), no chest pain, endorses edema, endorses urinary frequency  Past Medical History-  Patient Active Problem List   Diagnosis Date Noted  . Morbid obesity with BMI of 50.0-59.9, adult (HCC) 07/18/2013    Priority: High  . Acute pulmonary embolism (HCC) 07/13/2013    Priority: High  . SOB (shortness of breath) 10/02/2011    Priority: High  . Diabetes mellitus type 2, controlled (HCC) 07/17/2010    Priority: High  . Edema 12/08/2008    Priority: High  . Angioedema of lips 07/13/2013    Priority: Medium  . Dyslipidemia 10/02/2011    Priority: Medium  . BPH with urinary obstruction 07/17/2010    Priority: Medium  . Gout 03/15/2007    Priority: Medium  . Essential hypertension 03/15/2007    Priority: Medium  . Erectile dysfunction 07/30/2015    Priority: Low  . Osteoarthritis 03/22/2007    Priority: Low  . Multinodular goiter 07/13/2013  . NEUROPATHY, IDIOPATHIC PERIPHERAL NEC 05/31/2007    Medications- reviewed and updated Current Outpatient Prescriptions  Medication Sig Dispense Refill  . acetaminophen (TYLENOL) 500 MG tablet Take 1 tablet (500 mg total) by mouth every 6 (six) hours as needed. 30 tablet 0  . atorvastatin (LIPITOR) 40 MG tablet TAKE 1 TABLET (40 MG TOTAL) BY MOUTH DAILY. 90 tablet 2  . hydrochlorothiazide (HYDRODIURIL) 25 MG tablet Take 0.5-1 tablets (12.5-25 mg total) by mouth daily. 30 tablet 5  . terazosin (HYTRIN) 10 MG capsule Take 1 capsule (10 mg total) by mouth at bedtime. 30 capsule 5  . verapamil (CALAN-SR) 240 MG CR tablet TAKE 1 TABLET (240 MG TOTAL) BY MOUTH DAILY. 30 tablet 5  . vitamin B-12 (CYANOCOBALAMIN) 100 MCG tablet Take 100 mcg by mouth daily.     No current facility-administered medications for this visit.    Objective: BP 142/80 mmHg  Pulse 98   Temp(Src) 97.7 F (36.5 C) (Oral)  Wt 394 lb (178.717 kg) Gen: NAD, resting comfortably CV: RRR no murmurs rubs or gallops Lungs: CTAB no crackles, wheeze, rhonchi Abdomen: soft/nontender/nondistended/normal bowel sounds. No rebound or guarding.  Ext: 2+ pitting edema under compression stockings Skin: warm, dry, venous stasis changes Neuro: grossly normal, moves all extremities  Assessment/Plan:  BPH with urinary obstruction S: urgency with only mild improvement on terazosin 10 from 5mg . AUA score of 8 with 6 for QOL.  A/P: we discussed titrating up to 20mg  but patient does not want to make any changes in medicine currently. Readdress at April visit.    Essential hypertension S: poorly controlled. On Verapamil 240mg , hctz 12.5mg  (hesitant to increase with BPH issues), terazosin 10mg  . Weight is up nearly 18 lbs since October- admits to eating poorly and not being active BP Readings from Last 3 Encounters:  08/30/15 142/80  07/30/15 144/84  06/06/15 140/80  A/P:Continue current meds:  considered increase terazosin to 20mg . Could also Consider trial chlorthalidone 12.5mg  instead of hctz. Finally, considered lasix for edema- patient is very concerned about urinary symptoms so does not want this change. See edema     Diabetes mellitus type 2, controlled (HCC) S:a1c down to 6.4 last visit from 6.7 but unfortunately up nearly 20 lbs. Controlled without medication A/P: continues to decline education for diabetes. He wants to work on weight loss and states he has his own  plan he wanst to work through.    Edema S: long term issues with edema. Since cellulitis L >R more pronounced. U/s last visit revealed no DVT fortunately. Swelling has not worsened in leg but feels greater in foot and knee. R leg is 48 cm and left is 52 cm 10 cm below tibial pleateau. Weight is up which can worsen symptoms. Also has history of left knee surgery A/P: recommended echocardiogram as none done recently.  Would not be surprised by diastolic CHF which would be improved with lasix. Patient declines but will reconsider at follow up. Could also consider refer back to cards for prior noted SOB- see subsection. Consider updating bloodwork at follow up. Also likely strong component venous insufficiency.   April f/u Return precautions advised.

## 2015-08-30 NOTE — Assessment & Plan Note (Signed)
S: poorly controlled. On Verapamil 240mg , hctz 12.5mg  (hesitant to increase with BPH issues), terazosin 10mg  . Weight is up nearly 18 lbs since October- admits to eating poorly and not being active BP Readings from Last 3 Encounters:  08/30/15 142/80  07/30/15 144/84  06/06/15 140/80  A/P:Continue current meds:  considered increase terazosin to 20mg . Could also Consider trial chlorthalidone 12.5mg  instead of hctz. Finally, considered lasix for edema- patient is very concerned about urinary symptoms so does not want this change. See edema

## 2015-08-30 NOTE — Patient Instructions (Addendum)
Biggest thing have to reverse weight trend Wt Readings from Last 3 Encounters:  08/30/15 394 lb (178.717 kg)  07/30/15 376 lb (170.552 kg)  06/06/15 376 lb (170.552 kg)   Blood pressure a hair better than last time. Weight loss and regular exercise should get you there.   For swelling, we discussed ultrasound of heart. You opted out but we will consider if continued issues with lower salt in diet and regular exercise.   We did not go with lasix as could worsen urinary symptoms. Considered going up to 20mg  terazosin but we opted to continue this until follow up

## 2015-08-30 NOTE — Assessment & Plan Note (Addendum)
S:a1c down to 6.4 last visit from 6.7 but unfortunately up nearly 20 lbs. Controlled without medication A/P: continues to decline education for diabetes. He wants to work on weight loss and states he has his own plan he wanst to work through.

## 2015-08-30 NOTE — Assessment & Plan Note (Signed)
S: urgency with only mild improvement on terazosin 10 from 5mg . AUA score of 8 with 6 for QOL.  A/P: we discussed titrating up to 20mg  but patient does not want to make any changes in medicine currently. Readdress at April visit.

## 2015-09-12 ENCOUNTER — Other Ambulatory Visit: Payer: Self-pay | Admitting: Family Medicine

## 2015-10-15 ENCOUNTER — Other Ambulatory Visit: Payer: Self-pay | Admitting: Family Medicine

## 2015-11-28 ENCOUNTER — Ambulatory Visit: Payer: Medicare Other | Admitting: Family Medicine

## 2015-11-28 ENCOUNTER — Telehealth: Payer: Self-pay | Admitting: Family Medicine

## 2015-11-28 NOTE — Telephone Encounter (Signed)
Pt son would like to buy him ems machine circulation booster machine. Pt would like to know if its ok for him to use. Pt wife also will be using the machine her name is sandra Scruton also dr Therapist, nutritionalhunter pt

## 2015-11-29 NOTE — Telephone Encounter (Signed)
Pt returned call and per Dr. Durene CalHunter i gave pt information to call [facility] specialist for him to call to inquire about the EMS circulation booster.

## 2015-11-29 NOTE — Telephone Encounter (Signed)
Lm on pt vm tcb  

## 2015-12-03 ENCOUNTER — Ambulatory Visit: Payer: Self-pay | Admitting: Family Medicine

## 2016-01-11 DIAGNOSIS — M170 Bilateral primary osteoarthritis of knee: Secondary | ICD-10-CM | POA: Insufficient documentation

## 2016-01-16 ENCOUNTER — Other Ambulatory Visit: Payer: Self-pay | Admitting: Family Medicine

## 2016-04-22 ENCOUNTER — Other Ambulatory Visit: Payer: Self-pay | Admitting: Emergency Medicine

## 2016-04-22 MED ORDER — HYDROCHLOROTHIAZIDE 25 MG PO TABS: ORAL_TABLET | ORAL | 1 refills | Status: DC

## 2016-05-31 ENCOUNTER — Other Ambulatory Visit: Payer: Self-pay | Admitting: Family Medicine

## 2016-06-04 ENCOUNTER — Other Ambulatory Visit: Payer: Self-pay | Admitting: Family Medicine

## 2016-06-05 ENCOUNTER — Other Ambulatory Visit: Payer: Self-pay | Admitting: Family Medicine

## 2016-07-29 ENCOUNTER — Other Ambulatory Visit: Payer: Self-pay | Admitting: Family Medicine

## 2016-09-18 ENCOUNTER — Inpatient Hospital Stay (HOSPITAL_COMMUNITY)
Admission: EM | Admit: 2016-09-18 | Discharge: 2016-09-21 | DRG: 246 | Disposition: A | Discharge status: Home / Self Care | Payer: Medicare Other | Attending: Cardiovascular Disease | Admitting: Cardiovascular Disease

## 2016-09-18 ENCOUNTER — Emergency Department (HOSPITAL_COMMUNITY): Payer: Medicare Other

## 2016-09-18 ENCOUNTER — Telehealth: Payer: Self-pay | Admitting: Family Medicine

## 2016-09-18 ENCOUNTER — Encounter (HOSPITAL_COMMUNITY): Payer: Self-pay

## 2016-09-18 DIAGNOSIS — E119 Type 2 diabetes mellitus without complications: Secondary | ICD-10-CM | POA: Diagnosis present

## 2016-09-18 DIAGNOSIS — Z91048 Other nonmedicinal substance allergy status: Secondary | ICD-10-CM | POA: Diagnosis not present

## 2016-09-18 DIAGNOSIS — R0609 Other forms of dyspnea: Secondary | ICD-10-CM

## 2016-09-18 DIAGNOSIS — Z6841 Body Mass Index (BMI) 40.0 and over, adult: Secondary | ICD-10-CM | POA: Diagnosis not present

## 2016-09-18 DIAGNOSIS — Z791 Long term (current) use of non-steroidal anti-inflammatories (NSAID): Secondary | ICD-10-CM | POA: Diagnosis not present

## 2016-09-18 DIAGNOSIS — I25110 Atherosclerotic heart disease of native coronary artery with unstable angina pectoris: Secondary | ICD-10-CM | POA: Diagnosis present

## 2016-09-18 DIAGNOSIS — I214 Non-ST elevation (NSTEMI) myocardial infarction: Principal | ICD-10-CM | POA: Diagnosis present

## 2016-09-18 DIAGNOSIS — Z79899 Other long term (current) drug therapy: Secondary | ICD-10-CM | POA: Diagnosis not present

## 2016-09-18 DIAGNOSIS — Z888 Allergy status to other drugs, medicaments and biological substances status: Secondary | ICD-10-CM | POA: Diagnosis not present

## 2016-09-18 DIAGNOSIS — R7989 Other specified abnormal findings of blood chemistry: Secondary | ICD-10-CM

## 2016-09-18 DIAGNOSIS — Z87891 Personal history of nicotine dependence: Secondary | ICD-10-CM

## 2016-09-18 DIAGNOSIS — Y92230 Patient room in hospital as the place of occurrence of the external cause: Secondary | ICD-10-CM | POA: Diagnosis not present

## 2016-09-18 DIAGNOSIS — I110 Hypertensive heart disease with heart failure: Secondary | ICD-10-CM | POA: Diagnosis present

## 2016-09-18 DIAGNOSIS — I2510 Atherosclerotic heart disease of native coronary artery without angina pectoris: Secondary | ICD-10-CM

## 2016-09-18 DIAGNOSIS — R778 Other specified abnormalities of plasma proteins: Secondary | ICD-10-CM

## 2016-09-18 DIAGNOSIS — I5041 Acute combined systolic (congestive) and diastolic (congestive) heart failure: Secondary | ICD-10-CM | POA: Diagnosis present

## 2016-09-18 DIAGNOSIS — Z86711 Personal history of pulmonary embolism: Secondary | ICD-10-CM | POA: Diagnosis not present

## 2016-09-18 DIAGNOSIS — I471 Supraventricular tachycardia: Secondary | ICD-10-CM | POA: Diagnosis present

## 2016-09-18 DIAGNOSIS — T501X5A Adverse effect of loop [high-ceiling] diuretics, initial encounter: Secondary | ICD-10-CM | POA: Diagnosis not present

## 2016-09-18 DIAGNOSIS — M1990 Unspecified osteoarthritis, unspecified site: Secondary | ICD-10-CM | POA: Diagnosis present

## 2016-09-18 DIAGNOSIS — Z7902 Long term (current) use of antithrombotics/antiplatelets: Secondary | ICD-10-CM | POA: Diagnosis not present

## 2016-09-18 DIAGNOSIS — I509 Heart failure, unspecified: Secondary | ICD-10-CM | POA: Diagnosis not present

## 2016-09-18 DIAGNOSIS — R0600 Dyspnea, unspecified: Secondary | ICD-10-CM

## 2016-09-18 DIAGNOSIS — Z9861 Coronary angioplasty status: Secondary | ICD-10-CM

## 2016-09-18 DIAGNOSIS — Z955 Presence of coronary angioplasty implant and graft: Secondary | ICD-10-CM

## 2016-09-18 DIAGNOSIS — E876 Hypokalemia: Secondary | ICD-10-CM | POA: Diagnosis not present

## 2016-09-18 DIAGNOSIS — I249 Acute ischemic heart disease, unspecified: Secondary | ICD-10-CM | POA: Diagnosis present

## 2016-09-18 LAB — CBC
HEMATOCRIT: 41.4 % (ref 39.0–52.0)
HEMOGLOBIN: 13.6 g/dL (ref 13.0–17.0)
MCH: 29.5 pg (ref 26.0–34.0)
MCHC: 32.9 g/dL (ref 30.0–36.0)
MCV: 89.8 fL (ref 78.0–100.0)
Platelets: 175 10*3/uL (ref 150–400)
RBC: 4.61 MIL/uL (ref 4.22–5.81)
RDW: 14.7 % (ref 11.5–15.5)
WBC: 8.3 10*3/uL (ref 4.0–10.5)

## 2016-09-18 LAB — BASIC METABOLIC PANEL
ANION GAP: 6 (ref 5–15)
BUN: 24 mg/dL — ABNORMAL HIGH (ref 6–20)
CO2: 29 mmol/L (ref 22–32)
Calcium: 8.7 mg/dL — ABNORMAL LOW (ref 8.9–10.3)
Chloride: 104 mmol/L (ref 101–111)
Creatinine, Ser: 1.04 mg/dL (ref 0.61–1.24)
GFR calc Af Amer: >60 mL/min (ref >60)
Glucose, Bld: 154 mg/dL — ABNORMAL HIGH (ref 65–99)
POTASSIUM: 3.5 mmol/L (ref 3.5–5.1)
Sodium: 139 mmol/L (ref 135–145)

## 2016-09-18 LAB — CBG MONITORING, ED: Glucose-Capillary: 113 mg/dL — ABNORMAL HIGH (ref 65–99)

## 2016-09-18 LAB — I-STAT TROPONIN, ED: Troponin i, poc: 0.27 ng/mL (ref 0.00–0.08)

## 2016-09-18 LAB — BRAIN NATRIURETIC PEPTIDE: B NATRIURETIC PEPTIDE 5: 127.9 pg/mL — AB (ref 0.0–100.0)

## 2016-09-18 LAB — TROPONIN I: Troponin I: 0.3 ng/mL (ref <0.03)

## 2016-09-18 LAB — PROTIME-INR
INR: 1.12
Prothrombin Time: 14.5 seconds (ref 11.4–15.2)

## 2016-09-18 LAB — APTT: aPTT: 34 seconds (ref 24–36)

## 2016-09-18 MED ORDER — IOPAMIDOL (ISOVUE-370) INJECTION 76%
INTRAVENOUS | Status: AC
  Filled 2016-09-18: qty 100

## 2016-09-18 MED ORDER — ASPIRIN 81 MG PO CHEW
324.0000 mg | CHEWABLE_TABLET | Freq: Once | ORAL | Status: AC
  Administered 2016-09-18: 324 mg via ORAL
  Filled 2016-09-18: qty 4

## 2016-09-18 MED ORDER — ACETAMINOPHEN 325 MG PO TABS
650.0000 mg | ORAL_TABLET | ORAL | Status: DC | PRN
  Administered 2016-09-19: 17:00:00 650 mg via ORAL
  Filled 2016-09-18: qty 2

## 2016-09-18 MED ORDER — FUROSEMIDE 10 MG/ML IJ SOLN
20.0000 mg | Freq: Once | INTRAMUSCULAR | Status: AC
  Administered 2016-09-19: 20 mg via INTRAVENOUS
  Filled 2016-09-18 (×2): qty 4

## 2016-09-18 MED ORDER — ONDANSETRON HCL 4 MG/2ML IJ SOLN: 4.0000 mg | Freq: Four times a day (QID) | INTRAMUSCULAR | Status: DC | PRN

## 2016-09-18 MED ORDER — ASPIRIN EC 81 MG PO TBEC
81.0000 mg | DELAYED_RELEASE_TABLET | Freq: Every day | ORAL | Status: DC
  Administered 2016-09-19 – 2016-09-21 (×3): 81 mg via ORAL
  Filled 2016-09-18 (×3): qty 1

## 2016-09-18 MED ORDER — NITROGLYCERIN 0.4 MG SL SUBL: 0.4000 mg | SUBLINGUAL_TABLET | SUBLINGUAL | Status: DC | PRN

## 2016-09-18 MED ORDER — HEPARIN BOLUS VIA INFUSION
4000.0000 [IU] | Freq: Once | INTRAVENOUS | Status: AC
  Administered 2016-09-18: 4000 [IU] via INTRAVENOUS
  Filled 2016-09-18: qty 4000

## 2016-09-18 MED ORDER — IOPAMIDOL (ISOVUE-370) INJECTION 76%
100.0000 mL | Freq: Once | INTRAVENOUS | Status: AC | PRN
  Administered 2016-09-18: 100 mL via INTRAVENOUS

## 2016-09-18 MED ORDER — HEPARIN (PORCINE) IN NACL 100-0.45 UNIT/ML-% IJ SOLN
1800.0000 [IU]/h | INTRAMUSCULAR | Status: DC
  Administered 2016-09-18: 1400 [IU]/h via INTRAVENOUS
  Administered 2016-09-19: 1800 [IU]/h via INTRAVENOUS
  Filled 2016-09-18 (×2): qty 250

## 2016-09-18 MED ORDER — INSULIN ASPART 100 UNIT/ML ~~LOC~~ SOLN
0.0000 [IU] | Freq: Three times a day (TID) | SUBCUTANEOUS | Status: DC
  Administered 2016-09-19 – 2016-09-21 (×5): 3 [IU] via SUBCUTANEOUS

## 2016-09-18 NOTE — ED Triage Notes (Addendum)
PT C/O CHEST TIGHTNESS WITH INDIGESTION THIS MORNING. PT STS HE SPOKE WITH A NURSE AND WAS TOLD TO COME FOR FURTHER EVAL. PT STS DENIES CHEST PAIN AT THIS TIME,BUT STS HE GETS SOB WHEN WALKING LONG DISTANCES. PT STS HE HAS A HX OF PE.

## 2016-09-18 NOTE — Telephone Encounter (Signed)
Patient Name: Franklin Hicks DOB: 03/05/1951 Initial Comment Caller states he is having chest pain and shortness of breath. Nurse Assessment Nurse: Charna Elizabethrumbull, RN, Cathy Date/Time (Eastern Time): 09/18/2016 2:22:48 PM Confirm and document reason for call. If symptomatic, describe symptoms. ---Franklin Hicks states he developed chest pain (rated as a 5 on the 1 to 10 scale last night. No chest pain at this time.) and shortness of breath last night that went away. He was been having shortness of breath with exertion for the past couple of months. No injury in the past 3 days. No fever. Alert and responsive. Does the patient have any new or worsening symptoms? ---Yes Will a triage be completed? ---Yes Related visit to physician within the last 2 weeks? ---No Does the PT have any chronic conditions? (i.e. diabetes, asthma, etc.) ---Yes List chronic conditions. ---Previous Blood Clots, Fast heart beats Is this a behavioral health or substance abuse call? ---No Guidelines Guideline Title Affirmed Question Affirmed Notes Breathing Difficulty History of prior "blood clot" in leg or lungs (i.e., deep vein thrombosis, pulmonary embolism) Final Disposition User Go to ED Now Charna Elizabethrumbull, RN, Cathy Referrals [facility] - ED Disagree/Comply: Comply

## 2016-09-18 NOTE — Progress Notes (Signed)
ANTICOAGULATION CONSULT NOTE - Initial Consult  Pharmacy Consult for heparin Indication: chest pain/ACS  Allergies  Allergen Reactions  . Ace Inhibitors     Angioedema  . Other Hives and Itching    msg    Patient Measurements: Height: 6' (182.9 cm) Weight: (!) 380 lb (172.4 kg) IBW/kg (Calculated) : 77.6 Heparin Dosing Weight: 118 kg  Vital Signs: Temp: 97.7 F (36.5 C) (01/25 1647) Temp Source: Oral (01/25 1647) BP: 176/108 (01/25 1647) Pulse Rate: 117 (01/25 1647)  Labs:  Recent Labs  09/18/16 1713  HGB 13.6  HCT 41.4  PLT 175  LABPROT 14.5  INR 1.12  CREATININE 1.04    Estimated Creatinine Clearance: 112.6 mL/min (by C-G formula based on SCr of 1.04 mg/dL).   Medical History: Past Medical History:  Diagnosis Date  . DJD (degenerative joint disease)   . Gout   . Hypertension   . Obesity      Assessment: [age] yoM with ACS.   CBC WNL, INR 1.12. Note on any anticoagulants PTA. On ASA PTA.  Goal of Therapy:  Heparin level 0.3-0.7 units/ml Monitor platelets by anticoagulation protocol: Yes   Plan:  Heparin 4000 unit bolus then start infusion at 1400 units/hr. Heparin level in 6 hours. Daily heparin level and CBC while on heparin.  Clance Bollunyon, Iolani Twilley 09/18/2016,6:39 PM

## 2016-09-18 NOTE — Telephone Encounter (Signed)
Spoke with pt's wife and she states that pt is already in ED. Per chart he has not been checked in yet. Will monitor.

## 2016-09-18 NOTE — ED Notes (Signed)
Bed: [id number] Expected date:  Expected time:  Means of arrival:  Comments: Hold- Czaplicki

## 2016-09-18 NOTE — H&P (Signed)
Referring Physician:  [name] is an 66 y.o. male.                       Chief Complaint: Chest pain and shortness of breath.  HPI: 66 year old male has retrosternal chest pain with orthopnea and shortness of breath x 1 day. Some exertional shortness of breath x 2-3 months along with leg edema. No fever or cough. PMH is positive for pulmonary embolism, hypertension, gout, obesity. EKG shows sinus tachycardia. Chest x-ray is suggestive of cardiomegaly and vascular congestion. CT chest is negative for PE and positive for mild pulmonary edema and coronary artery calcifications.  Past Medical History:  Diagnosis Date  . DJD (degenerative joint disease)   . Gout   . Hypertension   . Obesity       Past Surgical History:  Procedure Laterality Date  . left knee open meniscetomy    . ureteral stone  [id number]  . US ECHOCARDIOGRAPHY  2004   no lvh nl ejection fraction    Family History  Problem Relation Age of Onset  . Liver disease Mother    Social History:  reports that he quit smoking about 39 years ago. His smoking use included Cigarettes. He has a 13.00 pack-year smoking history. He has never used smokeless tobacco. He reports that he drinks alcohol. He reports that he does not use drugs.  Allergies:  Allergies  Allergen Reactions  . Ace Inhibitors     Angioedema  . Other Hives and Itching    msg     (Not in a hospital admission)  Results for orders placed or performed during the hospital encounter of 09/18/16 (from the past 48 hour(s))  Basic metabolic panel     Status: Abnormal   Collection Time: 09/18/16  5:13 PM  Result Value Ref Range   Sodium 139 135 - 145 mmol/L   Potassium 3.5 3.5 - 5.1 mmol/L   Chloride 104 101 - 111 mmol/L   CO2 29 22 - 32 mmol/L   Glucose, Bld 154 (H) 65 - 99 mg/dL   BUN 24 (H) 6 - 20 mg/dL   Creatinine, Ser 1.04 0.61 - 1.24 mg/dL   Calcium 8.7 (L) 8.9 - 10.3 mg/dL   GFR calc non Af Amer >60 >60 mL/min   GFR calc Af Amer >60 >60 mL/min     Comment: (NOTE) The eGFR has been calculated using the CKD EPI equation. This calculation has not been validated in all clinical situations. eGFR's persistently <60 mL/min signify possible Chronic Kidney Disease.    Anion gap 6 5 - 15  CBC     Status: None   Collection Time: 09/18/16  5:13 PM  Result Value Ref Range   WBC 8.3 4.0 - 10.5 K/uL   RBC 4.61 4.22 - 5.81 MIL/uL   Hemoglobin 13.6 13.0 - 17.0 g/dL   HCT 41.4 39.0 - 52.0 %   MCV 89.8 78.0 - 100.0 fL   MCH 29.5 26.0 - 34.0 pg   MCHC 32.9 30.0 - 36.0 g/dL   RDW 14.7 11.5 - 15.5 %   Platelets 175 150 - 400 K/uL  Protime-INR (order if Patient is taking Coumadin / Warfarin)     Status: None   Collection Time: 09/18/16  5:13 PM  Result Value Ref Range   Prothrombin Time 14.5 11.4 - 15.2 seconds   INR 1.12   Brain natriuretic peptide     Status: Abnormal   Collection Time:  09/18/16  5:13 PM  Result Value Ref Range   B Natriuretic Peptide 127.9 (H) 0.0 - 100.0 pg/mL  APTT     Status: None   Collection Time: 09/18/16  5:13 PM  Result Value Ref Range   aPTT 34 24 - 36 seconds  I-stat troponin, ED     Status: Abnormal   Collection Time: 09/18/16  5:25 PM  Result Value Ref Range   Troponin i, poc 0.27 (HH) 0.00 - 0.08 ng/mL   Comment NOTIFIED PHYSICIAN    Comment 3            Comment: Due to the release kinetics of cTnI, a negative result within the first hours of the onset of symptoms does not rule out myocardial infarction with certainty. If myocardial infarction is still suspected, repeat the test at appropriate intervals.    Dg Chest 2 View  Result Date: 09/18/2016 CLINICAL DATA:  Chest pain and shortness of breath, shortness of breath with exertion for past couple months, history hypertension, former smoker EXAM: CHEST  2 VIEW COMPARISON:  07/13/2013 FINDINGS: Minimal enlargement of cardiac silhouette with pulmonary vascular congestion. Mediastinal contours normal. Eventrations of the diaphragms bilaterally. No  definite infiltrate, pleural effusion or pneumothorax. Osseous structures unremarkable. IMPRESSION: Enlargement of cardiac silhouette with pulmonary vascular congestion. No acute abnormalities. Electronically Signed   By: Lavonia Dana M.D.   On: 09/18/2016 17:43   Ct Angio Chest Pe W And/or Wo Contrast  Result Date: 09/18/2016 CLINICAL DATA:  Acute onset of generalized chest tightness and shortness of breath on exertion. Initial encounter. EXAM: CT ANGIOGRAPHY CHEST WITH CONTRAST TECHNIQUE: Multidetector CT imaging of the chest was performed using the standard protocol during bolus administration of intravenous contrast. Multiplanar CT image reconstructions and MIPs were obtained to evaluate the vascular anatomy. CONTRAST:  100 mL of Isovue 370 IV contrast COMPARISON:  Chest radiograph performed earlier today at 5:28 p.m., and CTA of the chest performed 07/13/2013 FINDINGS: Cardiovascular:  There is no evidence of pulmonary embolus. The heart is mildly enlarged. Scattered coronary artery calcifications are seen. The thoracic aorta is grossly unremarkable. The great vessels are within normal limits. Mediastinum/Nodes: No mediastinal lymphadenopathy is seen. No pericardial effusion is identified. The thyroid gland is grossly unremarkable. No axillary lymphadenopathy is seen. Lungs/Pleura: Patchy bibasilar airspace opacities may reflect atelectasis or minimal interstitial edema, given mild interstitial prominence. No pleural effusion or pneumothorax is seen. No masses are identified. Upper Abdomen: The visualized portions of the liver and spleen are grossly unremarkable. Musculoskeletal: No acute osseous abnormalities are identified. Mild degenerative change is noted at the glenohumeral joints bilaterally, worse on the right. Mild degenerative change is noted at the lower cervical spine. The visualized musculature is unremarkable in appearance. Review of the MIP images confirms the above findings. IMPRESSION: 1. No  evidence of pulmonary embolus. 2. Patchy bibasilar airspace opacities may reflect atelectasis or minimal interstitial edema, given mild interstitial prominence. 3. Mild cardiomegaly. 4. Scattered coronary artery calcifications. 5. Mild degenerative change at the glenohumeral joints bilaterally, worse on the right. Electronically Signed   By: Garald Balding M.D.   On: 09/18/2016 20:27    Review Of Systems Constitutional: No fever, chills , weight loss or gain. Eyes: No vision change, Wears glasses. No discharge or pain.. Ears: No hearing loss, No tinnitus. Respiratory: No asthma, COPD, pneumonias. Positive shortness of breath. No hemoptysis. Cardiovascular: Positive chest pain, palpitation and leg edema. Gastrointestinal: No nausea, vomiting or diarrhea or constipation. No GI bleed. No hepatitis.  Genitourinary: No dysuria, hematuria. H/O left kidney stone. No incontinance. Neurological: No headache, stroke or seizures.  Psychiatry: No psych facility admission for anxiety, depression or suicide. No detox. Skin: No rash. Musculoskeletal: No joint pain or fibromyalgia. No neck pain or back pain. Lymphadenopathy: No lymphadenopathy Hematology: No anemia or easy bruising.   Blood pressure 180/91, pulse 102, temperature 97.7 F (36.5 C), temperature source Oral, resp. rate 22, height 6' (1.829 m), weight (!) 172.4 kg (380 lb), SpO2 100 %. Body mass index is 51.54 kg/m. General appearance: alert, cooperative, appears stated age and mild respiratory distress Head: Normocephalic, atraumatic. Eyes: Brown eyes, Pink conjunctivae/corneas clear. PERRL, EOM's intact.  Neck: No adenopathy, no carotid bruit, no JVD, supple, symmetrical, trachea midline and thyroid not enlarged. Resp: Basal crackles and wheezing on cough on auscultation bilaterally. Cardio: regular rate and rhythm, S1, S2 normal, II/VI systolic murmur, no click, rub or gallop GI: soft, non-tender; bowel sounds normal; no masses,  no  organomegaly Extremities: 2 + lower leg edema, no cyanosis. Skin: Warm and dry. No rashes or lesions Neurologic: Alert and oriented X 3, normal strength and tone. Normal coordination.  Assessment/Plan Acute coronary syndrome Acute left heart systolic failure Obesity Hypertension  Admit. R/O CAD. Cardiac cath in AM. Patient understood risk and alternatives.  Birdie Riddle, MD  09/18/2016, 9:48 PM

## 2016-09-18 NOTE — ED Provider Notes (Signed)
[facility]-EMERGENCY DEPT Provider Note   CSN: [id number] Arrival date & time: 09/18/16  1637     History   Chief Complaint Chief Complaint  Patient presents with  . Shortness of Breath    HPI Franklin Hicks is a 66 y.o. male.  HPI 66 yo M with PMH xof HTN, morbid obesity, DVT/PE not on blood thinner here with SOB. Pt states that after eating dinner yesterday he developed an aching, pressure like CP. This CP seemed to worsen with exertion with associated DOE that is significantly above baseline. Pain also worsened when lying flat. It persisted throughout the night and he did not sleep well. Earlier this AM, he called the triage nurse at his doctor's who advised ED presentation. He endorses persistent mild SOB at rest, denies CP at rest. No nausea or diaphoresis.  Past Medical History:  Diagnosis Date  . DJD (degenerative joint disease)   . Gout   . Hypertension   . Obesity     Patient Active Problem List   Diagnosis Date Noted  . Erectile dysfunction 07/30/2015  . Morbid obesity with BMI of 50.0-59.9, adult (HCC) 07/18/2013  . Acute pulmonary embolism (HCC) 07/13/2013  . Angioedema of lips 07/13/2013  . Multinodular goiter 07/13/2013  . Dyslipidemia 10/02/2011  . SOB (shortness of breath) 10/02/2011  . BPH with urinary obstruction 07/17/2010  . Diabetes mellitus type 2, controlled (HCC) 07/17/2010  . Edema 12/08/2008  . NEUROPATHY, IDIOPATHIC PERIPHERAL NEC 05/31/2007  . Osteoarthritis 03/22/2007  . Gout 03/15/2007  . Essential hypertension 03/15/2007    Past Surgical History:  Procedure Laterality Date  . left knee open meniscetomy    . ureteral stone  [id number]  . US ECHOCARDIOGRAPHY  2004   no lvh nl ejection fraction       Home Medications    Prior to Admission medications   Medication Sig Start Date End Date Taking? Authorizing Provider  aspirin EC 81 MG tablet Take 81 mg by mouth daily.   Yes Historical Provider, MD  Aspirin-Caffeine 500-32.5 MG TABS Take 2  tablets by mouth daily.   Yes Historical Provider, MD  atorvastatin (LIPITOR) 40 MG tablet TAKE 1 TABLET (40 MG TOTAL) BY MOUTH DAILY. 06/02/16  Yes Shelva MajesticStephen O Hunter, MD  CINNAMON PO Take 1 capsule by mouth daily.   Yes Historical Provider, MD  hydrochlorothiazide (HYDRODIURIL) 25 MG tablet TAKE 0.5-1 TABLETS (12.5-25 MG TOTAL) BY MOUTH DAILY. 04/22/16  Yes Shelva MajesticStephen O Hunter, MD  Multiple Vitamin (MULTIVITAMIN WITH MINERALS) TABS tablet Take 1 tablet by mouth daily.   Yes Historical Provider, MD  naproxen sodium (ANAPROX) 220 MG tablet Take 440 mg by mouth 2 (two) times daily with a meal.   Yes Historical Provider, MD  terazosin (HYTRIN) 10 MG capsule TAKE 1 CAPSULE (10 MG TOTAL) BY MOUTH AT BEDTIME. 07/29/16  Yes Shelva MajesticStephen O Hunter, MD  verapamil (CALAN-SR) 240 MG CR tablet TAKE 1 TABLET (240 MG TOTAL) BY MOUTH DAILY. 07/29/16  Yes Shelva MajesticStephen O Hunter, MD  vitamin B-12 (CYANOCOBALAMIN) 100 MCG tablet Take 100 mcg by mouth daily.   Yes Historical Provider, MD  acetaminophen (TYLENOL) 500 MG tablet Take 1 tablet (500 mg total) by mouth every 6 (six) hours as needed. Patient not taking: Reported on 09/18/2016 07/18/13   Stacie GlazeJohn E Jenkins, MD  atorvastatin (LIPITOR) 40 MG tablet TAKE 1 TABLET (40 MG TOTAL) BY MOUTH DAILY. Patient not taking: Reported on 09/18/2016 06/05/16   Shelva MajesticStephen O Hunter, MD  atorvastatin (LIPITOR) 40 MG tablet  TAKE 1 TABLET (40 MG TOTAL) BY MOUTH DAILY. Patient not taking: Reported on 09/18/2016 06/06/16   Shelva Majestic, MD    Family History Family History  Problem Relation Age of Onset  . Liver disease Mother     Social History Social History  Substance Use Topics  . Smoking status: Former Smoker    Packs/day: 1.00    Years: 13.00    Types: Cigarettes    Quit date: 07/13/1977  . Smokeless tobacco: Never Used  . Alcohol use 0.0 oz/week     Comment: holidays only     Allergies   Ace inhibitors and Other   Review of Systems Review of Systems  Constitutional: Negative  for chills, fatigue and fever.  HENT: Negative for congestion and rhinorrhea.   Eyes: Negative for visual disturbance.  Respiratory: Positive for cough, chest tightness and shortness of breath. Negative for wheezing.   Cardiovascular: Positive for chest pain and leg swelling.  Gastrointestinal: Negative for abdominal pain, diarrhea, nausea and vomiting.  Genitourinary: Negative for dysuria and flank pain.  Musculoskeletal: Negative for neck pain and neck stiffness.  Skin: Negative for rash and wound.  Allergic/Immunologic: Negative for immunocompromised state.  Neurological: Positive for weakness. Negative for syncope and headaches.  All other systems reviewed and are negative.    Physical Exam Updated Vital Signs BP (!) 176/108 (BP Location: Right Arm)   Pulse 117   Temp 97.7 F (36.5 C) (Oral)   Resp 20   Ht 6' (1.829 m)   Wt (!) 380 lb (172.4 kg)   SpO2 100%   BMI 51.54 kg/m   Physical Exam  Constitutional: He is oriented to person, place, and time. He appears well-developed and well-nourished. No distress.  HENT:  Head: Normocephalic and atraumatic.  Mouth/Throat: Oropharynx is clear and moist. No oropharyngeal exudate.  Eyes: Conjunctivae are normal.  Neck: Neck supple.  Cardiovascular: Normal rate, regular rhythm and normal heart sounds.  Exam reveals no friction rub.   No murmur heard. Pulmonary/Chest: Effort normal. No respiratory distress. He has decreased breath sounds. He has wheezes. He has rales in the right lower field and the left lower field.  Abdominal: He exhibits no distension.  Musculoskeletal: He exhibits edema (2+ pitting).  Neurological: He is alert and oriented to person, place, and time. He exhibits normal muscle tone.  Skin: Skin is warm. Capillary refill takes less than 2 seconds.  Psychiatric: He has a normal mood and affect.  Nursing note and vitals reviewed.    ED Treatments / Results  Labs (all labs ordered are listed, but only abnormal  results are displayed) Labs Reviewed  BASIC METABOLIC PANEL - Abnormal; Notable for the following:       Result Value   Glucose, Bld 154 (*)    BUN 24 (*)    Calcium 8.7 (*)    All other components within normal limits  I-STAT TROPOININ, ED - Abnormal; Notable for the following:    Troponin i, poc 0.27 (*)    All other components within normal limits  CBC  PROTIME-INR  BRAIN NATRIURETIC PEPTIDE    EKG  EKG Interpretation  Date/Time:  Thursday September 18 2016 17:05:37 EST Ventricular Rate:  105 PR Interval:    QRS Duration: 90 QT Interval:  345 QTC Calculation: 456 R Axis:   30 Text Interpretation:  Sinus tachycardia Baseline wander in lead(s) III No significant change since last tracing Confirmed by Jrue Jarriel MD, Sheria Lang [phone]) on 09/18/2016 6:20:27 PM  Radiology Dg Chest 2 View  Result Date: 09/18/2016 CLINICAL DATA:  Chest pain and shortness of breath, shortness of breath with exertion for past couple months, history hypertension, former smoker EXAM: CHEST  2 VIEW COMPARISON:  07/13/2013 FINDINGS: Minimal enlargement of cardiac silhouette with pulmonary vascular congestion. Mediastinal contours normal. Eventrations of the diaphragms bilaterally. No definite infiltrate, pleural effusion or pneumothorax. Osseous structures unremarkable. IMPRESSION: Enlargement of cardiac silhouette with pulmonary vascular congestion. No acute abnormalities. Electronically Signed   By: Ulyses Southward M.D.   On: 09/18/2016 17:43    Procedures .Critical Care Performed by: Shaune Pollack Authorized by: Shaune Pollack   Critical care provider statement:    Critical care time (minutes):  35   Critical care time was exclusive of:  Separately billable procedures and treating other patients   Critical care was necessary to treat or prevent imminent or life-threatening deterioration of the following conditions:  Cardiac failure and circulatory failure   Critical care was time spent personally by  me on the following activities:  Blood draw for specimens, development of treatment plan with patient or surrogate, discussions with consultants, evaluation of patient's response to treatment, examination of patient, ordering and review of laboratory studies, ordering and performing treatments and interventions, ordering and review of radiographic studies, pulse oximetry, re-evaluation of patient's condition and review of old charts   I assumed direction of critical care for this patient from another provider in my specialty: no     (including critical care time)  Medications Ordered in ED Medications  aspirin chewable tablet 324 mg (not administered)     Initial Impression / Assessment and Plan / ED Course  I have reviewed the triage vital signs and the nursing notes.  Pertinent labs & imaging results that were available during my care of the patient were reviewed by me and considered in my medical decision making (see chart for details).    66 yo M with PMHx as above here with CP, SOB, and DOE. On arrival, EKG non-ischemic. VS show tachycardia, HTN. Exam overall reassuring, though pt does have significant b/l LE edema. Initial labs show +troponin, o/w unremarkable. DDx: ACS/NTEMI, PE, CHF with demand ischemia, also HTN urgency. Will obtain stat CTA PE, start on heparin gtt give [place]/f NSTEMI versus PE with high-risk history.  CT Angio shows no PE, but + for interstitial edema. I have consulted Dr. Algie Coffer, who will admit. IV lasix given.  Final Clinical Impressions(s) / ED Diagnoses   Final diagnoses:  Elevated troponin  Dyspnea on exertion  NSTEMI (non-ST elevated myocardial infarction) (HCC)      Shaune Pollack, MD 09/19/16 [phone]

## 2016-09-19 ENCOUNTER — Encounter (HOSPITAL_COMMUNITY): Payer: Self-pay | Admitting: Cardiovascular Disease

## 2016-09-19 ENCOUNTER — Inpatient Hospital Stay (HOSPITAL_COMMUNITY): Payer: Medicare Other

## 2016-09-19 ENCOUNTER — Encounter (HOSPITAL_COMMUNITY)
Admission: EM | Disposition: A | Discharge status: Home / Self Care | Payer: Self-pay | Source: Home / Self Care | Attending: Cardiovascular Disease

## 2016-09-19 DIAGNOSIS — I25110 Atherosclerotic heart disease of native coronary artery with unstable angina pectoris: Secondary | ICD-10-CM

## 2016-09-19 DIAGNOSIS — I509 Heart failure, unspecified: Secondary | ICD-10-CM

## 2016-09-19 HISTORY — PX: TRANSTHORACIC ECHOCARDIOGRAM: SHX275

## 2016-09-19 HISTORY — PX: CARDIAC CATHETERIZATION: SHX172

## 2016-09-19 LAB — GLUCOSE, CAPILLARY
GLUCOSE-CAPILLARY: 142 mg/dL — AB (ref 65–99)
GLUCOSE-CAPILLARY: 119 mg/dL — AB (ref 65–99)
Glucose-Capillary: 117 mg/dL — ABNORMAL HIGH (ref 65–99)
Glucose-Capillary: 134 mg/dL — ABNORMAL HIGH (ref 65–99)

## 2016-09-19 LAB — ECHOCARDIOGRAM COMPLETE
Height: 72 in
Weight: 6585.6 oz

## 2016-09-19 LAB — HEPARIN LEVEL (UNFRACTIONATED)
HEPARIN UNFRACTIONATED: 0.19 [IU]/mL — AB (ref 0.30–0.70)
Heparin Unfractionated: 0.47 IU/mL (ref 0.30–0.70)

## 2016-09-19 LAB — POCT ACTIVATED CLOTTING TIME: Activated Clotting Time: 510 seconds

## 2016-09-19 SURGERY — LEFT HEART CATH AND CORONARY ANGIOGRAPHY
Anesthesia: LOCAL

## 2016-09-19 MED ORDER — SODIUM CHLORIDE 0.9 % IV SOLN
INTRAVENOUS | Status: DC | PRN
  Administered 2016-09-19 (×2)
  Administered 2016-09-19: 1.75 mg/kg/h via INTRAVENOUS
  Administered 2016-09-19: 12:00:00

## 2016-09-19 MED ORDER — IOPAMIDOL (ISOVUE-370) INJECTION 76%
INTRAVENOUS | Status: AC
Start: 2016-09-19 — End: 2016-09-19
  Filled 2016-09-19: qty 100

## 2016-09-19 MED ORDER — SODIUM CHLORIDE 0.9% FLUSH
3.0000 mL | Freq: Two times a day (BID) | INTRAVENOUS | Status: DC
  Administered 2016-09-20 – 2016-09-21 (×3): 3 mL via INTRAVENOUS

## 2016-09-19 MED ORDER — IOPAMIDOL (ISOVUE-370) INJECTION 76%
INTRAVENOUS | Status: DC | PRN
  Administered 2016-09-19: 115 mL via INTRA_ARTERIAL

## 2016-09-19 MED ORDER — FENTANYL CITRATE (PF) 100 MCG/2ML IJ SOLN
INTRAMUSCULAR | Status: DC | PRN
  Administered 2016-09-19 (×2): 25 ug via INTRAVENOUS

## 2016-09-19 MED ORDER — HEPARIN BOLUS VIA INFUSION
3000.0000 [IU] | Freq: Once | INTRAVENOUS | Status: AC
  Administered 2016-09-19: 3000 [IU] via INTRAVENOUS
  Filled 2016-09-19: qty 3000

## 2016-09-19 MED ORDER — SODIUM CHLORIDE 0.9 % IV SOLN: 250.0000 mL | INTRAVENOUS | Status: DC | PRN

## 2016-09-19 MED ORDER — BIVALIRUDIN 250 MG IV SOLR
INTRAVENOUS | Status: AC
  Filled 2016-09-19: qty 250

## 2016-09-19 MED ORDER — NITROGLYCERIN 1 MG/10 ML FOR IR/CATH LAB
INTRA_ARTERIAL | Status: AC
  Filled 2016-09-19: qty 10

## 2016-09-19 MED ORDER — PERFLUTREN LIPID MICROSPHERE
INTRAVENOUS | Status: AC
  Filled 2016-09-19: qty 10

## 2016-09-19 MED ORDER — SODIUM CHLORIDE 0.9% FLUSH: 3.0000 mL | Freq: Two times a day (BID) | INTRAVENOUS | Status: DC

## 2016-09-19 MED ORDER — MIDAZOLAM HCL 2 MG/2ML IJ SOLN
INTRAMUSCULAR | Status: DC | PRN
  Administered 2016-09-19 (×2): 1 mg via INTRAVENOUS

## 2016-09-19 MED ORDER — IOPAMIDOL (ISOVUE-370) INJECTION 76%
INTRAVENOUS | Status: AC
  Filled 2016-09-19: qty 100

## 2016-09-19 MED ORDER — SODIUM CHLORIDE 0.9% FLUSH
3.0000 mL | Freq: Two times a day (BID) | INTRAVENOUS | Status: DC
  Administered 2016-09-19: 21:00:00 3 mL via INTRAVENOUS

## 2016-09-19 MED ORDER — ANGIOPLASTY BOOK
Freq: Once | Status: AC
  Administered 2016-09-19: 20:00:00
  Filled 2016-09-19: qty 1

## 2016-09-19 MED ORDER — HYDRALAZINE HCL 20 MG/ML IJ SOLN
5.0000 mg | INTRAMUSCULAR | Status: AC | PRN
  Administered 2016-09-19: 5 mg via INTRAVENOUS
  Filled 2016-09-19: qty 1

## 2016-09-19 MED ORDER — FENTANYL CITRATE (PF) 100 MCG/2ML IJ SOLN
INTRAMUSCULAR | Status: AC
  Filled 2016-09-19: qty 2

## 2016-09-19 MED ORDER — TICAGRELOR 90 MG PO TABS
90.0000 mg | ORAL_TABLET | Freq: Two times a day (BID) | ORAL | Status: DC
  Administered 2016-09-19 – 2016-09-21 (×4): 90 mg via ORAL
  Filled 2016-09-19 (×4): qty 1

## 2016-09-19 MED ORDER — LABETALOL HCL 5 MG/ML IV SOLN
INTRAVENOUS | Status: DC | PRN
  Administered 2016-09-19: 10 mg via INTRAVENOUS

## 2016-09-19 MED ORDER — MIDAZOLAM HCL 2 MG/2ML IJ SOLN
INTRAMUSCULAR | Status: AC
  Filled 2016-09-19: qty 2

## 2016-09-19 MED ORDER — HEPARIN (PORCINE) IN NACL 2-0.9 UNIT/ML-% IJ SOLN
INTRAMUSCULAR | Status: DC | PRN
  Administered 2016-09-19: 1000 mL

## 2016-09-19 MED ORDER — LABETALOL HCL 5 MG/ML IV SOLN
10.0000 mg | INTRAVENOUS | Status: AC | PRN
  Administered 2016-09-19 (×2): 10 mg via INTRAVENOUS
  Filled 2016-09-19 (×2): qty 4

## 2016-09-19 MED ORDER — BIVALIRUDIN BOLUS VIA INFUSION - CUPID
INTRAVENOUS | Status: DC | PRN
  Administered 2016-09-19: 140.025 mg via INTRAVENOUS

## 2016-09-19 MED ORDER — IOPAMIDOL (ISOVUE-370) INJECTION 76%
INTRAVENOUS | Status: AC
  Filled 2016-09-19: qty 50

## 2016-09-19 MED ORDER — SODIUM CHLORIDE 0.9% FLUSH: 3.0000 mL | INTRAVENOUS | Status: DC | PRN

## 2016-09-19 MED ORDER — SODIUM CHLORIDE 0.9 % IV SOLN
1.7500 mg/kg/h | INTRAVENOUS | Status: AC
  Administered 2016-09-19: 1.75 mg/kg/h via INTRAVENOUS
  Filled 2016-09-19: qty 250

## 2016-09-19 MED ORDER — LABETALOL HCL 5 MG/ML IV SOLN
INTRAVENOUS | Status: AC
  Filled 2016-09-19: qty 4

## 2016-09-19 MED ORDER — HEPARIN (PORCINE) IN NACL 2-0.9 UNIT/ML-% IJ SOLN
INTRAMUSCULAR | Status: AC
  Filled 2016-09-19: qty 1000

## 2016-09-19 MED ORDER — ATORVASTATIN CALCIUM 80 MG PO TABS
80.0000 mg | ORAL_TABLET | Freq: Every day | ORAL | Status: DC
  Administered 2016-09-19 – 2016-09-20 (×2): 80 mg via ORAL
  Filled 2016-09-19 (×3): qty 1

## 2016-09-19 MED ORDER — FUROSEMIDE 10 MG/ML IJ SOLN
40.0000 mg | Freq: Once | INTRAMUSCULAR | Status: AC
  Administered 2016-09-19: 13:00:00 40 mg via INTRAVENOUS
  Filled 2016-09-19: qty 4

## 2016-09-19 MED ORDER — BIVALIRUDIN BOLUS VIA INFUSION - CUPID: INTRAVENOUS | Status: DC | PRN

## 2016-09-19 MED ORDER — PERFLUTREN LIPID MICROSPHERE
1.0000 mL | INTRAVENOUS | Status: AC | PRN
  Administered 2016-09-19: 2 mL via INTRAVENOUS
  Filled 2016-09-19: qty 10

## 2016-09-19 MED ORDER — SODIUM CHLORIDE 0.9 % IV SOLN: INTRAVENOUS | Status: DC

## 2016-09-19 MED ORDER — TICAGRELOR 90 MG PO TABS
ORAL_TABLET | ORAL | Status: AC
  Filled 2016-09-19: qty 2

## 2016-09-19 MED ORDER — IOPAMIDOL (ISOVUE-370) INJECTION 76%
INTRAVENOUS | Status: DC | PRN
  Administered 2016-09-19: 80 mL via INTRA_ARTERIAL

## 2016-09-19 MED ORDER — ATROPINE SULFATE 1 MG/10ML IJ SOSY
PREFILLED_SYRINGE | INTRAMUSCULAR | Status: AC
  Filled 2016-09-19: qty 10

## 2016-09-19 MED ORDER — ALPRAZOLAM 0.25 MG PO TABS
0.2500 mg | ORAL_TABLET | Freq: Four times a day (QID) | ORAL | Status: DC | PRN
  Administered 2016-09-19: 0.25 mg via ORAL
  Filled 2016-09-19: qty 1

## 2016-09-19 MED ORDER — LIDOCAINE HCL (PF) 1 % IJ SOLN
INTRAMUSCULAR | Status: AC
  Filled 2016-09-19: qty 30

## 2016-09-19 MED ORDER — LIDOCAINE HCL (PF) 1 % IJ SOLN
INTRAMUSCULAR | Status: DC | PRN
  Administered 2016-09-19: 20 mL

## 2016-09-19 MED ORDER — TICAGRELOR 90 MG PO TABS
ORAL_TABLET | ORAL | Status: DC | PRN
  Administered 2016-09-19: 180 mg via ORAL

## 2016-09-19 MED ORDER — NITROGLYCERIN 1 MG/10 ML FOR IR/CATH LAB
INTRA_ARTERIAL | Status: DC | PRN
  Administered 2016-09-19: 200 ug via INTRACORONARY

## 2016-09-19 MED ORDER — BIVALIRUDIN 250 MG IV SOLR
INTRAVENOUS | Status: AC
Start: 2016-09-19 — End: 2016-09-19
  Filled 2016-09-19: qty 250

## 2016-09-19 SURGICAL SUPPLY — 19 items
BALLN MOZEC 2.0X12 (BALLOONS) ×2
BALLN ~~LOC~~ MOZEC 2.75X15 (BALLOONS) ×2
BALLOON MOZEC 2.0X12 (BALLOONS) IMPLANT
BALLOON ~~LOC~~ MOZEC 2.75X15 (BALLOONS) IMPLANT
CATH INFINITI 5FR MULTPACK ANG (CATHETERS) ×1 IMPLANT
CATH VISTA GUIDE 6FR XB3.5 (CATHETERS) ×1 IMPLANT
HOVERMATT SINGLE USE (MISCELLANEOUS) ×1 IMPLANT
KIT ENCORE 26 ADVANTAGE (KITS) ×1 IMPLANT
KIT HEART LEFT (KITS) ×2 IMPLANT
NDL SMART REG 18GX2-3/4 (NEEDLE) IMPLANT
NEEDLE SMART REG 18GX2-3/4 (NEEDLE) ×2 IMPLANT
PACK CARDIAC CATHETERIZATION (CUSTOM PROCEDURE TRAY) ×2 IMPLANT
SHEATH PINNACLE 5F 10CM (SHEATH) ×1 IMPLANT
SHEATH PINNACLE 6F 10CM (SHEATH) ×1 IMPLANT
STENT RESOLUTE ONYX 2.75X18 (Permanent Stent) ×1 IMPLANT
SYR MEDRAD MARK V 150ML (SYRINGE) ×2 IMPLANT
TRANSDUCER W/STOPCOCK (MISCELLANEOUS) ×2 IMPLANT
WIRE EMERALD 3MM-J .035X150CM (WIRE) ×1 IMPLANT
WIRE RUNTHROUGH .014X180CM (WIRE) ×1 IMPLANT

## 2016-09-19 NOTE — Telephone Encounter (Signed)
Pt admitted for NSTEMI.

## 2016-09-19 NOTE — Progress Notes (Signed)
ANTICOAGULATION CONSULT NOTE - Follow-up Consult  Pharmacy Consult for heparin Indication: chest pain/ACS  Allergies  Allergen Reactions  . Ace Inhibitors     Angioedema  . Other Hives and Itching    msg    Patient Measurements: Height: 6' (182.9 cm) Weight: (!) 411 lb 9.6 oz (186.7 kg) IBW/kg (Calculated) : 77.6 Heparin Dosing Weight: 118 kg  Vital Signs: Temp: 98.1 F (36.7 C) (01/26 0152) Temp Source: Oral (01/26 0152) BP: 158/98 (01/26 0152) Pulse Rate: 107 (01/26 0152)  Labs:  Recent Labs  09/18/16 1713 09/19/16 0203  HGB 13.6  --   HCT 41.4  --   PLT 175  --   APTT 34  --   LABPROT 14.5  --   INR 1.12  --   HEPARINUNFRC  --  0.19*  CREATININE 1.04  --   TROPONINI 0.30*  --     Estimated Creatinine Clearance: 118.2 mL/min (by C-G formula based on SCr of 1.04 mg/dL).  Assessment: 65 yoM on heparin for ACS. Heparin level subtherapeutic (0.19) on gtt at 1400 units/hr. No issues with line or bleeding reported per RN.  Goal of Therapy:  Heparin level 0.3-0.7 units/ml Monitor platelets by anticoagulation protocol: Yes   Plan:  Rebolus heparin 3000 units Increase heparin gtt 1800 units/hr  Heparin level in 6 hours. Daily heparin level and CBC while on heparin.  Christoper Fabianaron Marki Frede, PharmD, BCPS Clinical pharmacist, pager [phone] 09/19/2016,2:48 AM

## 2016-09-19 NOTE — Care Management Note (Addendum)
Case Management Note  Patient Details  Name: Franklin Hicks MRN: [id number] Date of Birth: 12/25/1950  Subjective/Objective:  S/p coronary stent intervention, will be on brilinta, NCM gave 30 day savings card to patient's grand daughter in the room , inform them to go to [facility] on [address] to get the 30 day free.  NCM will cont to follow for dc needs.                 Action/Plan:   Expected Discharge Date:                  Expected Discharge Plan:  Home/Self Care  In-House Referral:     Discharge planning Services  CM Consult  Post Acute Care Choice:    Choice offered to:     DME Arranged:    DME Agency:     HH Arranged:    HH Agency:     Status of Service:  Completed, signed off  If discussed at [employer] Length of Stay Meetings, dates discussed:    Additional Comments:  Leone Havenaylor, Myan Locatelli Clinton, RN 09/19/2016, 3:09 PM

## 2016-09-19 NOTE — Brief Op Note (Signed)
Brief Cardiac Catheterization Note (Full Report to Follow  Date: 09/19/2016 Time: 11:08 AM  PATIENT:  Franklin Hicks  66 y.o. male  PRE-OPERATIVE DIAGNOSIS:  cp  POST-OPERATIVE DIAGNOSIS: NSTEMI  PROCEDURE:  Procedure(s): Left Heart Cath and Coronary Angiography (N/A) Coronary Stent Intervention (N/A)  SURGEON:  Surgeon(s) and Role: Panel 1:    * Orpah CobbAjay Kadakia, MD - Primary  Panel 2:    * Yvonne Kendallhristopher Patrisha Hausmann, MD - Primary  FINDINGS: 1. Significant ramus intermedius stenosis (see Dr. Roseanne KaufmanKadakia's note for details) 2. Successful PCI to proximal ramus with placement of Resolute Onyx 2.75 x 18 mm DES  RECOMMENDATIONS: 1. DAPT with aspirin and ticagrelor for at least 12 months 2. Aggressive secondary prevention. 3. Diuresis and medical optimization of heart failure, per Dr. Algie CofferKadakia.  Yvonne Kendallhristopher Tiegan Terpstra, MD [facility] Pager: [phone]

## 2016-09-19 NOTE — Progress Notes (Signed)
S/W STEPHANIE  @ [employer] # [phone]   BRILINTA 90 MG BID    COVER- YES  CO-PAY- $ 64.00  TIER- 3 DRUG  PRIOR APPROVAL- NO  PHARMACY : [facility]

## 2016-09-19 NOTE — Interval H&P Note (Signed)
History and Physical Interval Note:  09/19/2016 9:43 AM  Franklin Hicks  has presented today for surgery, with the diagnosis of cp  The various methods of treatment have been discussed with the patient and family. After consideration of risks, benefits and other options for treatment, the patient has consented to  Procedure(s): Left Heart Cath and Coronary Angiography (N/A) as a surgical intervention .  The patient's history has been reviewed, patient examined, no change in status, stable for surgery.  I have reviewed the patient's chart and labs.  Questions were answered to the patient's satisfaction.     Shakiyla Kook S

## 2016-09-19 NOTE — Progress Notes (Signed)
Site area: right groin  Site Prior to Removal:  Level 0  Pressure Applied For 15 MINUTES    Minutes Beginning at 17:35  Manual:   Yes.    Patient Status During Pull:  WNL  Post Pull Groin Site:  Level 0  Post Pull Instructions Given:  Yes.    Post Pull Pulses Present:  Yes.    Dressing Applied:  Yes.    Comments:  Pt tolerated well.

## 2016-09-20 DIAGNOSIS — Z9861 Coronary angioplasty status: Secondary | ICD-10-CM

## 2016-09-20 DIAGNOSIS — I214 Non-ST elevation (NSTEMI) myocardial infarction: Secondary | ICD-10-CM

## 2016-09-20 DIAGNOSIS — I2510 Atherosclerotic heart disease of native coronary artery without angina pectoris: Secondary | ICD-10-CM

## 2016-09-20 DIAGNOSIS — I252 Old myocardial infarction: Secondary | ICD-10-CM | POA: Insufficient documentation

## 2016-09-20 HISTORY — DX: Atherosclerotic heart disease of native coronary artery without angina pectoris: I25.10

## 2016-09-20 HISTORY — DX: Non-ST elevation (NSTEMI) myocardial infarction: I21.4

## 2016-09-20 HISTORY — DX: Atherosclerotic heart disease of native coronary artery without angina pectoris: Z98.61

## 2016-09-20 LAB — GLUCOSE, CAPILLARY
GLUCOSE-CAPILLARY: 130 mg/dL — AB (ref 65–99)
GLUCOSE-CAPILLARY: 123 mg/dL — AB (ref 65–99)
GLUCOSE-CAPILLARY: 112 mg/dL — AB (ref 65–99)
GLUCOSE-CAPILLARY: 97 mg/dL (ref 65–99)

## 2016-09-20 LAB — BASIC METABOLIC PANEL
ANION GAP: 10 (ref 5–15)
BUN: 17 mg/dL (ref 6–20)
CALCIUM: 8.9 mg/dL (ref 8.9–10.3)
CO2: 28 mmol/L (ref 22–32)
Chloride: 101 mmol/L (ref 101–111)
Creatinine, Ser: 1.03 mg/dL (ref 0.61–1.24)
Glucose, Bld: 147 mg/dL — ABNORMAL HIGH (ref 65–99)
Potassium: 3.3 mmol/L — ABNORMAL LOW (ref 3.5–5.1)
SODIUM: 139 mmol/L (ref 135–145)

## 2016-09-20 LAB — CBC
HEMATOCRIT: 44.2 % (ref 39.0–52.0)
Hemoglobin: 14.2 g/dL (ref 13.0–17.0)
MCH: 29.3 pg (ref 26.0–34.0)
MCHC: 32.1 g/dL (ref 30.0–36.0)
MCV: 91.3 fL (ref 78.0–100.0)
PLATELETS: 178 10*3/uL (ref 150–400)
RBC: 4.84 MIL/uL (ref 4.22–5.81)
RDW: 15.1 % (ref 11.5–15.5)
WBC: 9.8 10*3/uL (ref 4.0–10.5)

## 2016-09-20 LAB — HEMOGLOBIN A1C
Hgb A1c MFr Bld: 6.7 % — ABNORMAL HIGH (ref 4.8–5.6)
Mean Plasma Glucose: 146 mg/dL

## 2016-09-20 MED ORDER — FUROSEMIDE 10 MG/ML IJ SOLN
40.0000 mg | Freq: Once | INTRAMUSCULAR | Status: AC
  Administered 2016-09-20: 40 mg via INTRAVENOUS
  Filled 2016-09-20: qty 4

## 2016-09-20 MED ORDER — POTASSIUM CHLORIDE ER 10 MEQ PO TBCR
10.0000 meq | EXTENDED_RELEASE_TABLET | Freq: Three times a day (TID) | ORAL | Status: AC
  Administered 2016-09-20 (×3): 10 meq via ORAL
  Filled 2016-09-20 (×6): qty 1

## 2016-09-20 MED ORDER — METOPROLOL TARTRATE 25 MG PO TABS
25.0000 mg | ORAL_TABLET | Freq: Two times a day (BID) | ORAL | Status: DC
  Administered 2016-09-20 – 2016-09-21 (×2): 25 mg via ORAL
  Filled 2016-09-20 (×2): qty 1

## 2016-09-20 NOTE — Progress Notes (Signed)
Ref: Tana Conch, MD  Subjective:  Feeling better. Had elevated LVEDP during cath yesterday.   Objective:  Vital Signs in the last 24 hours: Temp:  [97 F (36.1 C)-98.7 F (37.1 C)] 97.7 F (36.5 C) (01/27 1726) Pulse Rate:  [90-97] 97 (01/27 1726) Cardiac Rhythm: Normal sinus rhythm (01/27 1124) Resp:  [16-23] 20 (01/27 1726) BP: (134-169)/(56-73) 144/71 (01/27 1726) SpO2:  [95 %-98 %] 98 % (01/27 1726) Weight:  [178.1 kg (392 lb 9.6 oz)-179.2 kg (395 lb 1 oz)] 178.1 kg (392 lb 9.6 oz) (01/27 1110)  Physical Exam: BP Readings from Last 1 Encounters:  09/20/16 (!) 144/71     Wt Readings from Last 1 Encounters:  09/20/16 (!) 178.1 kg (392 lb 9.6 oz)    Weight change: 6.833 kg (15 lb 1 oz) Body mass index is 53.25 kg/m. HEENT: [place]/AT, Eyes-Brown, PERL, EOMI, Conjunctiva-Pink, Sclera-Non-icteric Neck: No JVD, No bruit, Trachea midline. Lungs:  Clearing, Bilateral. Cardiac:  Regular rhythm, normal S1 and S2, no S3. II/VI systolic murmur. Abdomen:  Soft, non-tender. BS present. Extremities:  1+ edema present. No cyanosis. No clubbing. No right groin hematoma. CNS: AxOx3, Cranial nerves grossly intact, moves all 4 extremities.  Skin: Warm and dry.   Intake/Output from previous day: 01/26 0701 - 01/27 0700 In: 170 [P.O.:170] Out: 4200 [Urine:4200]    Lab Results: BMET    Component Value Date/Time   NA 139 09/20/2016 0237   NA 139 09/18/2016 1713   NA 138 12/26/2014 1039   K 3.3 (L) 09/20/2016 0237   K 3.5 09/18/2016 1713   K 4.1 12/26/2014 1039   CL 101 09/20/2016 0237   CL 104 09/18/2016 1713   CL 103 12/26/2014 1039   CO2 28 09/20/2016 0237   CO2 29 09/18/2016 1713   CO2 29 12/26/2014 1039   GLUCOSE 147 (H) 09/20/2016 0237   GLUCOSE 154 (H) 09/18/2016 1713   GLUCOSE 136 (H) 12/26/2014 1039   BUN 17 09/20/2016 0237   BUN 24 (H) 09/18/2016 1713   BUN 25 (H) 12/26/2014 1039   CREATININE 1.03 09/20/2016 0237   CREATININE 1.04 09/18/2016 1713   CREATININE  1.27 12/26/2014 1039   CALCIUM 8.9 09/20/2016 0237   CALCIUM 8.7 (L) 09/18/2016 1713   CALCIUM 8.8 12/26/2014 1039   GFRNONAA >60 09/20/2016 0237   GFRNONAA >60 09/18/2016 1713   GFRNONAA 68 (L) 07/14/2013 0456   GFRAA >60 09/20/2016 0237   GFRAA >60 09/18/2016 1713   GFRAA 78 (L) 07/14/2013 0456   CBC    Component Value Date/Time   WBC 9.8 09/20/2016 0237   RBC 4.84 09/20/2016 0237   HGB 14.2 09/20/2016 0237   HCT 44.2 09/20/2016 0237   PLT 178 09/20/2016 0237   MCV 91.3 09/20/2016 0237   MCH 29.3 09/20/2016 0237   MCHC 32.1 09/20/2016 0237   RDW 15.1 09/20/2016 0237   LYMPHSABS 2.4 11/21/2013 1705   MONOABS 0.4 11/21/2013 1705   EOSABS 0.1 11/21/2013 1705   BASOSABS 0.0 11/21/2013 1705   HEPATIC Function Panel No results for input(s): PROT in the last 8760 hours.  Invalid input(s):  ALBUMIN,  AST,  ALT,  ALKPHOS,  BILIDIR,  IBILI HEMOGLOBIN A1C No components found for: HGA1C,  MPG CARDIAC ENZYMES Lab Results  Component Value Date   TROPONINI 0.30 (HH) 09/18/2016   TROPONINI <0.30 07/13/2013   BNP No results for input(s): PROBNP in the last 8760 hours. TSH No results for input(s): TSH in the last 8760 hours. CHOLESTEROL No  results for input(s): CHOL in the last 8760 hours.  Scheduled Meds: . aspirin EC  81 mg Oral Daily  . atorvastatin  80 mg Oral q1800  . insulin aspart  0-20 Units Subcutaneous TID WC  . metoprolol tartrate  25 mg Oral BID  . potassium chloride  10 mEq Oral TID  . sodium chloride flush  3 mL Intravenous Q12H  . ticagrelor  90 mg Oral BID   Continuous Infusions: PRN Meds:.sodium chloride, sodium chloride, acetaminophen, ALPRAZolam, nitroGLYCERIN, ondansetron (ZOFRAN) IV, sodium chloride flush  Assessment/Plan: Acute coronary syndrome LCX stent Multivessel native vessel coronary artery disease Hypertension Obesity  IV lasix. Transfer to telemetry. PT/OT   LOS: 2 days    Orpah CobbAjay Mashal Slavick  MD  09/20/2016, 7:33 PM

## 2016-09-20 NOTE — Progress Notes (Signed)
CARDIAC REHAB PHASE I   PRE:  Rate/Rhythm: 94 SR  BP:  Supine:   Sitting: 152/56  Standing:    SaO2: 98%RA  MODE:  Ambulation: 120 ft   POST:  Rate/Rhythm: 123 ST  BP:  Supine:   Sitting: 143/76  Standing:    SaO2: 98%RA 0750-0850 Pt walked independently using canes and holding to side rail at times. Walked 120 ft. Would recommend PT/OT consult for home needs as he seemed unsteady at times with me. He stated it takes a while for his knees to adjust when he has been in bed a lot. Gave pt CHF booklet, MI booklet and discussed signs and symptoms of CHF and when to call MD.. Pt does not have scales at home. Case manager needs to see re brilinta and possibly to get large scales. Pt has brilinta card but does not know how much it will cost monthly. Reviewed NTG use, 2000 mg sodium restrictions, MI restrictions and stressed importance of brilinta with stent. Discussed CRP 2 and will refer to [place]. Pt stated he might ask cardiologist about low impact water exercises but is ok with referral. To chair after walk. No CP.   Luetta Nuttingharlene Suresh Audi, RN BSN  09/20/2016 8:45 AM

## 2016-09-20 NOTE — Evaluation (Signed)
Physical Therapy Evaluation Patient Details Name: Franklin Hicks MRN: [id number] DOB: 1950/08/28 Today's Date: 09/20/2016   History of Present Illness  Patient is a 66 yo male admitted 09/18/16 with chest pain and SOB.  Patient with ACS/NSTEMI.  Patient s/p cardiac cath angioplasty with stent on 09/19/16.   PMH:  PE, HTN, gout, obesity  Clinical Impression  Patient reports he is at his baseline for functional mobility and gait.  Patient able to ambulate 120' with 2 canes and min guard assist with no loss of balance.  No acute PT needs identified - PT will sign off.  Encourage patient to continue to work with Cardiac Rehab Program.    Follow Up Recommendations No PT follow up;Supervision for mobility/OOB (Patient declines f/u PT.  Recommend Phase II CRP)    Equipment Recommendations  None recommended by PT    Recommendations for Other Services       Precautions / Restrictions Precautions Precautions: Fall Restrictions Weight Bearing Restrictions: No      Mobility  Bed Mobility Overal bed mobility: Modified Independent             General bed mobility comments: Increased time.  Required assist to don shoes.  Transfers Overall transfer level: Needs assistance Equipment used: Straight cane (Uses 2 straight canes) Transfers: Sit to/from Stand Sit to Stand: Min assist         General transfer comment: Patient using rocking motion and momentum to move to standing from lower bed.  Majority weight on RLE during transfer due to lack of ROM of Lt knee.  Ambulation/Gait Ambulation/Gait assistance: Min guard Ambulation Distance (Feet): 120 Feet Assistive device: Straight cane (Using 2 straight canes) Gait Pattern/deviations: Step-through pattern;Decreased stride length;Shuffle;Trunk flexed;Wide base of support Gait velocity: decreased Gait velocity interpretation: Below normal speed for age/gender General Gait Details: Verbal cues to try to stand upright during gait.  Patient  reports knees make it hard to stand upright.  No loss of balance during gait.  Patient reports he is at his baseline for gait.  Stairs            Wheelchair Mobility    Modified Rankin (Stroke Patients Only)       Balance Overall balance assessment: Needs assistance         Standing balance support: Bilateral upper extremity supported;During functional activity Standing balance-[name]: Poor Standing balance comment: Requires UE support for balance                             Pertinent Vitals/Pain Pain Assessment: No/denies pain    Home Living Family/patient expects to be discharged to:: Private residence Living Arrangements: Spouse/significant other;Other relatives Available Help at Discharge: Family;Available 24 hours/day Type of Home: House Home Access: Stairs to enter Entrance Stairs-Rails: Doctor, general practice of Steps: 12 Home Layout: Two level;Able to live on main level with bedroom/bathroom Home Equipment: Cane - single point (2 canes)      Prior Function Level of Independence: Independent with assistive device(s);Needs assistance   Gait / Transfers Assistance Needed: Uses 2 canes for ambulation  ADL's / Homemaking Assistance Needed: Assist for socks/shoes, meal prep        Hand Dominance        Extremity/Trunk Assessment   Upper Extremity Assessment Upper Extremity Assessment: Overall WFL for tasks assessed    Lower Extremity Assessment Lower Extremity Assessment: Generalized weakness;LLE deficits/detail LLE Deficits / Details: Decreased knee ROM - to 50*  flexion only       Communication   Communication: No difficulties  Cognition Arousal/Alertness: Awake/alert Behavior During Therapy: WFL for tasks assessed/performed;Flat affect Overall Cognitive Status: Within Functional Limits for tasks assessed                      General Comments      Exercises     Assessment/Plan    PT Assessment  Patent does not need any further PT services  PT Problem List            PT Treatment Interventions      PT Goals (Current goals can be found in the Care Plan section)  Acute Rehab PT Goals PT Goal Formulation: All assessment and education complete, DC therapy    Frequency     Barriers to discharge        Co-evaluation               End of Session   Activity Tolerance: Patient tolerated treatment well Patient left: in bed;with call bell/phone within reach;with family/visitor present (sitting EOB) Nurse Communication: Mobility status (Encourage patient to participate with CRP)         Time: [id number] PT Time Calculation (min) (ACUTE ONLY): 14 min   Charges:   PT Evaluation $PT Eval Moderate Complexity: 1 Procedure     PT G Codes:        Vena AustriaSusan H Ariell Gunnels 09/20/2016, 6:11 PM Durenda HurtSusan H. Renaldo Fiddleravis, PT, [facility] Acute Rehab Services Pager [phone]

## 2016-09-20 NOTE — Progress Notes (Signed)
Received transfer  from [facility], patient alert and oriented, no distress noted, no complain of any pain. Vascular site with band aid on no s/s of bleeding. Agree with previous RN's assessment.

## 2016-09-20 NOTE — Progress Notes (Signed)
Monitor alarm for 6 beat run of Ventricular rhythm.  Patient lying in bed, non symptomatic.  1 minute later another 6 beat run occurred, again the patient denies any chest pain, dizziness, or feeling of any palpitations. Patient has been having multifocal PVC's throughout the evening.  Will continue to monitor closely.

## 2016-09-20 NOTE — Progress Notes (Signed)
Patient HR up to 140-150 ST for couple of minutes, pt. Asymptomatic, MD notified. See manage order for metoprolol order. Will continue to monitor the patient.

## 2016-09-21 LAB — BASIC METABOLIC PANEL
ANION GAP: 12 (ref 5–15)
BUN: 23 mg/dL — ABNORMAL HIGH (ref 6–20)
CALCIUM: 9 mg/dL (ref 8.9–10.3)
CHLORIDE: 100 mmol/L — AB (ref 101–111)
CO2: 25 mmol/L (ref 22–32)
Creatinine, Ser: 1.21 mg/dL (ref 0.61–1.24)
GFR calc non Af Amer: >60 mL/min (ref >60)
GLUCOSE: 160 mg/dL — AB (ref 65–99)
Potassium: 3.5 mmol/L (ref 3.5–5.1)
Sodium: 137 mmol/L (ref 135–145)

## 2016-09-21 LAB — CBC
HEMATOCRIT: 42.1 % (ref 39.0–52.0)
Hemoglobin: 13.7 g/dL (ref 13.0–17.0)
MCH: 29.9 pg (ref 26.0–34.0)
MCHC: 32.5 g/dL (ref 30.0–36.0)
MCV: 91.9 fL (ref 78.0–100.0)
PLATELETS: 180 10*3/uL (ref 150–400)
RBC: 4.58 MIL/uL (ref 4.22–5.81)
RDW: 15.1 % (ref 11.5–15.5)
WBC: 9.6 10*3/uL (ref 4.0–10.5)

## 2016-09-21 LAB — GLUCOSE, CAPILLARY
Glucose-Capillary: 124 mg/dL — ABNORMAL HIGH (ref 65–99)
Glucose-Capillary: 130 mg/dL — ABNORMAL HIGH (ref 65–99)

## 2016-09-21 LAB — LIPID PANEL
CHOL/HDL RATIO: 3.2 ratio
CHOLESTEROL: 111 mg/dL (ref 0–200)
HDL: 35 mg/dL — ABNORMAL LOW (ref >40)
LDL CALC: 55 mg/dL (ref 0–99)
TRIGLYCERIDES: 104 mg/dL (ref <150)
VLDL: 21 mg/dL (ref 0–40)

## 2016-09-21 MED ORDER — MAGNESIUM SULFATE IN D5W 1-5 GM/100ML-% IV SOLN
1.0000 g | Freq: Once | INTRAVENOUS | Status: AC
  Administered 2016-09-21: 1 g via INTRAVENOUS
  Filled 2016-09-21: qty 100

## 2016-09-21 MED ORDER — METOPROLOL TARTRATE 25 MG PO TABS: 25.0000 mg | ORAL_TABLET | Freq: Two times a day (BID) | ORAL | 3 refills | Status: DC

## 2016-09-21 MED ORDER — NITROGLYCERIN 0.4 MG SL SUBL: 0.4000 mg | SUBLINGUAL_TABLET | SUBLINGUAL | 1 refills | Status: DC | PRN

## 2016-09-21 MED ORDER — METOPROLOL TARTRATE 50 MG PO TABS: 50.0000 mg | ORAL_TABLET | Freq: Two times a day (BID) | ORAL | Status: DC

## 2016-09-21 MED ORDER — FUROSEMIDE 20 MG PO TABS: 20.0000 mg | ORAL_TABLET | ORAL | 3 refills | Status: DC

## 2016-09-21 MED ORDER — ATORVASTATIN CALCIUM 80 MG PO TABS: 80.0000 mg | ORAL_TABLET | Freq: Every day | ORAL | 3 refills | Status: DC

## 2016-09-21 MED ORDER — POTASSIUM CHLORIDE ER 10 MEQ PO TBCR: 10.0000 meq | EXTENDED_RELEASE_TABLET | Freq: Every day | ORAL | 6 refills | Status: DC

## 2016-09-21 MED ORDER — TICAGRELOR 90 MG PO TABS: 90.0000 mg | ORAL_TABLET | Freq: Two times a day (BID) | ORAL | 6 refills | Status: DC

## 2016-09-21 MED ORDER — METOPROLOL TARTRATE 50 MG PO TABS: 50.0000 mg | ORAL_TABLET | Freq: Two times a day (BID) | ORAL | 3 refills | Status: DC

## 2016-09-21 NOTE — Progress Notes (Signed)
Patient alert and oriented, denies pain. R. Femoral level 0, clean bandage applied Magnesium iv x 1 dose given per order. Iv and tele d/c. D/c instruction explain and given to the patient. All questions answered. Pt. Verbalized understanding. D/c pt. Home per order.

## 2016-09-21 NOTE — Care Management Note (Signed)
Case Management Note  Patient Details  Name: Franklin Hicks MRN: [id number] Date of Birth: [birth year]  Subjective/Objective:                  chest pain and SOB Action/Plan: Discharge planning Expected Discharge Date:                  Expected Discharge Plan:  Home/Self Care  In-House Referral:     Discharge planning Services  CM Consult  Post Acute Care Choice:    Choice offered to:     DME Arranged:  N/A DME Agency:  NA  HH Arranged:  NA HH Agency:  NA  Status of Service:  Completed, signed off  If discussed at [place] of Stay Meetings, dates discussed:    Additional Comments: Cm met with pt in room to make sure he has the 30 day free trial card and to explain we no longer provide scales (from gift shop).  Pt is refusig all H services and denies need for any DMe.  No other Cm needs were communicated. Dellie Catholic, RN 09/21/2016, 9:11 AM

## 2016-09-21 NOTE — Progress Notes (Signed)
Patient have 10 beats of v.tach, pt. Asymptomatic.MD notified see manage order for medication adjustment. Will continue to monitor patient.

## 2016-09-21 NOTE — Discharge Summary (Signed)
Physician Discharge Summary  Patient ID: KIEN MIRSKY MRN: [id number] DOB/AGE: 27-Apr-1951 66 y.o.  Admit date: 09/18/2016 Discharge date: 09/21/2016  Admission Diagnoses: Acute coronary syndrome LCX stent Multivessel, native vessel CAD Obesity  Discharge Diagnoses:  Principle Problems:   Acute coronary syndrome Active Problems:   Multivessel native vessel CAD   LCX stent   Acute systolic and diastolic heart failure   Obesity, morbid   Hypokalemia-corrected   Paroxysmal SVT   Diabetes mellitus, diet controlled  Discharged Condition: fair  Hospital Course: 66 year old male presented with retrosternal chest pain with orthopnea and shortness of breath. He had mild pulmonary edema. His cardiac catheterization showed 95 % proximal Ramus stenosis. The lesion was treated with 2.75 x 18 mm drug eluting stent with 0 % residual stenosis and TIMI-3 flow.  Post catheterization he had diuresis with lasix use. He had several episodes of few beats SVT treated with incremental increase in metoprolol dose.  He is willing to give up fried food and sweet food to control his diabetes.  Heart failure care instructions were given.  He will see me in 1 week and primary care in 1 month for follow up and blood work.  Lipid panel was pending at time of discharge. He is allergic to lisinopril and may try small dose losartan if renal function remains stable in 1-2 weeks.  Consults: cardiology  Significant Diagnostic Studies: labs: CBC-normal. BMET near normal with mild hypokalemia post lasix use and blood sugar of 97 to 154 mg.   Treatments: cardiac meds: aspirin, atorvastatin, Brilinta, metoprolol, furosemide and potassium.  Discharge Exam: Blood pressure 111/80, pulse 88, temperature 98 F (36.7 C), temperature source Oral, resp. rate 20, height 6' (1.829 m), weight (!) 177.5 kg (391 lb 4.8 oz), SpO2 94 %. General appearance: alert, cooperative and appears stated age Head: Normocephalic,  atraumatic. Eyes: Brown eyes, pink conjunctivae/corneas clear. PERRL, EOM's intact.  Neck: No adenopathy, no carotid bruit, no JVD, supple, symmetrical, trachea midline and thyroid not enlarged. Resp: clear to auscultation bilaterally. Cardio: regular rate and rhythm, S1, S2 normal, II/VI systolic murmur, no click, rub or gallop GI: soft, non-tender; bowel sounds normal; no masses,  no organomegaly Extremities: extremities normal, atraumatic, no cyanosis or edema Skin: Warm and dry. No rashes or lesions Neurologic: Alert and oriented X 3, normal strength and tone. Normal coordination and slow gait.  Disposition: 01-Home or Self Care  Discharge Instructions    Amb Referral to Cardiac Rehabilitation    Complete by:  As directed    Diagnosis:   NSTEMI Coronary Stents       Allergies as of 09/21/2016      Reactions   Ace Inhibitors    Angioedema   Other Hives, Itching   msg      Medication List    STOP taking these medications   Aspirin-Caffeine 500-32.5 MG Tabs   hydrochlorothiazide 25 MG tablet Commonly known as:  HYDRODIURIL   naproxen sodium 220 MG tablet Commonly known as:  ANAPROX   verapamil 240 MG CR tablet Commonly known as:  CALAN-SR     TAKE these medications   aspirin EC 81 MG tablet Take 81 mg by mouth daily.   atorvastatin 80 MG tablet Commonly known as:  LIPITOR Take 1 tablet (80 mg total) by mouth daily at 6 PM. What changed:  See the new instructions.   CINNAMON PO Take 1 capsule by mouth daily.   furosemide 20 MG tablet Commonly known as:  LASIX Take  1 tablet (20 mg total) by mouth every Monday, Wednesday, and Friday. Start taking on:  09/22/2016   metoprolol 50 MG tablet Commonly known as:  LOPRESSOR Take 1 tablet (50 mg total) by mouth 2 (two) times daily.   multivitamin with minerals Tabs tablet Take 1 tablet by mouth daily.   nitroGLYCERIN 0.4 MG SL tablet Commonly known as:  NITROSTAT Place 1 tablet (0.4 mg total) under the tongue  every 5 (five) minutes x 3 doses as needed for chest pain.   potassium chloride 10 MEQ tablet Commonly known as:  K-DUR Take 1 tablet (10 mEq total) by mouth daily.   terazosin 10 MG capsule Commonly known as:  HYTRIN TAKE 1 CAPSULE (10 MG TOTAL) BY MOUTH AT BEDTIME.   ticagrelor 90 MG Tabs tablet Commonly known as:  BRILINTA Take 1 tablet (90 mg total) by mouth 2 (two) times daily.   vitamin B-12 100 MCG tablet Commonly known as:  CYANOCOBALAMIN Take 100 mcg by mouth daily.      Follow-up Information    Tana ConchStephen Hunter, MD. Schedule an appointment as soon as possible for a visit in 1 month(s).   Specialty:  Family Medicine Contact information: [address] [place] [address] [phone]        Ricki RodriguezKADAKIA,Sydnie Sigmund S, MD. Schedule an appointment as soon as possible for a visit in 1 week(s).   Specialty:  Cardiology Contact information: [address] [place] [address] [phone]           Signed: Ricki RodriguezKADAKIA,Miyanna Wiersma S 09/21/2016, 1:47 PM

## 2016-09-21 NOTE — Progress Notes (Signed)
Patient have questionable 6 beats of v.tach, RN was at the bedside at the time, pt was asymptomatic, denies pain, no shortness of breath. VSS. MD notified. B met order. Awaiting B met result and continue to monitor the patient.

## 2016-09-26 ENCOUNTER — Encounter: Payer: Self-pay | Admitting: Family Medicine

## 2016-09-26 ENCOUNTER — Ambulatory Visit (INDEPENDENT_AMBULATORY_CARE_PROVIDER_SITE_OTHER): Payer: Medicare Other | Admitting: Family Medicine

## 2016-09-26 VITALS — BP 130/74 | HR 79 | Ht 72.0 in | Wt 398.0 lb

## 2016-09-26 DIAGNOSIS — R319 Hematuria, unspecified: Secondary | ICD-10-CM

## 2016-09-26 DIAGNOSIS — M545 Low back pain, unspecified: Secondary | ICD-10-CM

## 2016-09-26 LAB — POCT URINALYSIS DIPSTICK
BILIRUBIN UA: NEGATIVE
GLUCOSE UA: NEGATIVE
KETONES UA: NEGATIVE
Leukocytes, UA: NEGATIVE
Nitrite, UA: NEGATIVE
PROTEIN UA: NEGATIVE
SPEC GRAV UA: 1.02
Urobilinogen, UA: 0.2
pH, UA: 6

## 2016-09-26 MED ORDER — ONDANSETRON 8 MG PO TBDP: 8.0000 mg | ORAL_TABLET | Freq: Three times a day (TID) | ORAL | 0 refills | Status: DC | PRN

## 2016-09-26 MED ORDER — HYDROCODONE-ACETAMINOPHEN 5-325 MG PO TABS: 1.0000 | ORAL_TABLET | ORAL | 0 refills | Status: DC | PRN

## 2016-09-26 NOTE — Progress Notes (Signed)
Subjective:     Patient ID: Franklin SpareCarl J Langenberg, male   DOB: 05/29/1951, 66 y.o.   MRN: [id number]  HPI Patient's seen for acute visit with concern for possible kidney stone. He states he woke up this morning with some left lower back pain and has had some intermittent pain since then. He states he's had prior history of kidney stones. He saw some particulate stuff in the toilet which he thought were fragments of stone. He has not had any gross hematuria. No burning with urination. No fevers or chills. Current pain is 2 out of 10 . 7-8 out of 10 at its worst. Denies any abdominal pain. He had some nausea earlier today but not currently. He's not sure what type of stones he's had previously. Denies any chest pains.  Past Medical History:  Diagnosis Date  . DJD (degenerative joint disease)   . Gout   . Hypertension   . Obesity    Past Surgical History:  Procedure Laterality Date  . CARDIAC CATHETERIZATION N/A 09/19/2016   Procedure: Left Heart Cath and Coronary Angiography;  Surgeon: Orpah CobbAjay Kadakia, MD;  Location: [facility] INVASIVE CV LAB;  Service: Cardiovascular;  Laterality: N/A;  . CARDIAC CATHETERIZATION N/A 09/19/2016   Procedure: Coronary Stent Intervention;  Surgeon: Yvonne Kendallhristopher End, MD;  Location: [facility] INVASIVE CV LAB;  Service: Cardiovascular;  Laterality: N/A;  . left knee open meniscetomy    . ureteral stone  [id number]  . US ECHOCARDIOGRAPHY  2004   no lvh nl ejection fraction    reports that he quit smoking about 39 years ago. His smoking use included Cigarettes. He has a 13.00 pack-year smoking history. He has never used smokeless tobacco. He reports that he drinks alcohol. He reports that he does not use drugs. family history includes Liver disease in his mother. Allergies  Allergen Reactions  . Ace Inhibitors     Angioedema  . Other Hives and Itching    msg     Review of Systems  Constitutional: Negative for chills, fever and unexpected weight change.  Cardiovascular: Negative for chest  pain.  Gastrointestinal: Negative for abdominal pain, diarrhea and vomiting.  Genitourinary: Positive for flank pain. Negative for difficulty urinating and hematuria.  Musculoskeletal: Positive for back pain.       Objective:   Physical Exam  Constitutional: He appears well-developed and well-nourished.  HENT:  Mouth/Throat: Oropharynx is clear and moist.  Cardiovascular: Normal rate and regular rhythm.   Pulmonary/Chest: Effort normal and breath sounds normal. No respiratory distress. He has no wheezes. He has no rales.  Abdominal: Soft. There is no tenderness.       Assessment:     Left flank pain. Question of acute kidney stone. Currently in no distress. Urine dipstick does show 2+ blood but no leukocytes or nitrites. He does not have any fever, chills, or other indicators of infection    Plan:     -Zofran 8 mg every 8 hours as needed for nausea/vomiting -Stay well-hydrated -Limited hydrocodone 5/325 mg 1-2 every 4-6 hours as needed for severe pain #20 -Touch base by Monday if symptoms not fully resolved. -May need to consider imaging if symptoms not improved by next week -Follow-up sooner for any fever or worsening pain -consider follow up urine in 3 weeks at follow up with primary to make sure hematuria cleared.  Kristian CoveyBruce W Yechiel Erny MD [facility]

## 2016-09-26 NOTE — Progress Notes (Signed)
Pre visit review using our clinic review tool, if applicable. No additional management support is needed unless otherwise documented below in the visit note. 

## 2016-09-26 NOTE — Patient Instructions (Signed)
Stay well hydrated Follow up for any fever or worsening pain Touch base by Monday if pain not resolved.

## 2016-09-29 ENCOUNTER — Telehealth: Payer: Self-pay | Admitting: Family Medicine

## 2016-09-29 DIAGNOSIS — N200 Calculus of kidney: Secondary | ICD-10-CM

## 2016-09-29 NOTE — Telephone Encounter (Signed)
May enter urgent referral to urology under kidney stone.

## 2016-09-29 NOTE — Telephone Encounter (Signed)
Patient Name: Katelyn Odoherty  DOB: 03/20/1951    Initial Comment Caller states he was seen and was told to call back today. He has not passed the stone and he has blood in his urine.    Nurse Assessment  Nurse: Stefano GaulStringer, RN, Dwana CurdVera Date/Time (Eastern Time): 09/29/2016 2:40:32 PM  Confirm and document reason for call. If symptomatic, describe symptoms. ---Caller states he was seen on Friday and was diagnosed with a kidney stone. He was told to call today if he has not passed the stone. Saw blood in his urine yesterday but not today. Has left flank pain that is not as severe as it was on Friday. pain level 1-2. No fever.  Does the patient have any new or worsening symptoms? ---Yes  Will a triage be completed? ---Yes  Related visit to physician within the last 2 weeks? ---Yes  Does the PT have any chronic conditions? (i.e. diabetes, asthma, etc.) ---Yes  List chronic conditions. ---had cardiac stent a week ago  Is this a behavioral health or substance abuse call? ---No     Guidelines    Guideline Title Affirmed Question Affirmed Notes  Flank Pain [1] MILD pain (i.e., scale 1-3; does not interfere with normal activities) AND [2] present > 3 days    Final Disposition User   See PCP When Office is Open (within 3 days) Stefano GaulStringer, Charity fundraiserN, Dwana CurdVera    Comments  pt states that Dr. Caryl NeverBurchette said to call back if has not passed the stone as he may need referral to the urologist or need a scan. Pt does not want to come back into [place] as he has already been seen. Please call pt back regarding scan or urology referral   Disagree/Comply: Comply

## 2016-09-29 NOTE — Telephone Encounter (Signed)
Referral placed urgently. Called patient to let him know

## 2016-10-09 ENCOUNTER — Ambulatory Visit (INDEPENDENT_AMBULATORY_CARE_PROVIDER_SITE_OTHER): Payer: Medicare Other | Admitting: Family Medicine

## 2016-10-09 ENCOUNTER — Encounter: Payer: Self-pay | Admitting: Family Medicine

## 2016-10-09 DIAGNOSIS — E119 Type 2 diabetes mellitus without complications: Secondary | ICD-10-CM | POA: Diagnosis not present

## 2016-10-09 DIAGNOSIS — E785 Hyperlipidemia, unspecified: Secondary | ICD-10-CM | POA: Diagnosis not present

## 2016-10-09 DIAGNOSIS — I5042 Chronic combined systolic (congestive) and diastolic (congestive) heart failure: Secondary | ICD-10-CM | POA: Diagnosis not present

## 2016-10-09 DIAGNOSIS — M1990 Unspecified osteoarthritis, unspecified site: Secondary | ICD-10-CM

## 2016-10-09 DIAGNOSIS — I2510 Atherosclerotic heart disease of native coronary artery without angina pectoris: Secondary | ICD-10-CM

## 2016-10-09 HISTORY — DX: Chronic combined systolic (congestive) and diastolic (congestive) heart failure: I50.42

## 2016-10-09 MED ORDER — DICLOFENAC SODIUM 1 % TD GEL: 2.0000 g | Freq: Four times a day (QID) | TRANSDERMAL | 3 refills | Status: DC | PRN

## 2016-10-09 NOTE — Patient Instructions (Addendum)
Please stop by lab before you go  Trial voltaren gel for the arthritis since you could not take medicine like ibuprofen/aleve/motrin (as long as not too expensive). Can continue tylenol- maximum of 3000mg  a day.   We will call you within a week or two about your referral to diabetic education. If you do not hear within 3 weeks, give us a call.   Schedule a physical in 3-4 months.     ______________________________________________________________________  Starting October 1st 2018, I will be transferring to our new location: [place] [address]- [place] from [employer] [address] [address]) [place] [id number] Phone: [phone]  I would love to have you remain my patient at this new location as long as it remains convenient for you. I am excited about the opportunity to have x-ray and sports medicine in the new building but will really [facility] the [facility] staff and [facility]. [facility] to [facility] and we will automatically transfer them to the [place] location starting October 1st.

## 2016-10-09 NOTE — Assessment & Plan Note (Signed)
Refer to diabetes education- also wants to work in his low salt 2000mg  diet.  Lab Results  Component Value Date   HGBA1C 6.7 (H) 09/18/2016  does not have to start meds yet

## 2016-10-09 NOTE — Progress Notes (Signed)
Subjective:  Franklin Hicks is a 66 y.o. year old very pleasant male patient who presents for hospital follow up for acute coronary syndrome. Patient was hospitalized from 09/18/2016 to 09/13/2016.   Medical complexity moderate   Patient is a 66 year old male who presented to the hospital with retrosternal chest pain as well as shortness of breath. On initial checks x-ray he was found to have mild pulmonary edema as well as enlargement of the cardiac silhouette. I independently reviewed this x-ray.His initial BNP was mildly elevated. His initial troponin was elevated at 0.3 ng/mL. Appears he was placed on a heparin drip.  Patient had a cardiac catheterization which showed 95% stenosis of proximal ramus. Patient had an NSTEMI due to unknown coronary artery disease. This lesion was treated with a drug-eluting stent. He was started on aspirin 81 mg daily and Brilinta 90 mg twice a day.  In regards to his hyperlipidemia- his atorvastatin was increased to 80 mg. He had only been taking half a tablet of 40 mg  In regards to his heart failure. An echocardiogram revealed ejection fraction of 45% but also with grade 2 diastolic dysfunction. After catheterization he was diuresed with Lasix. He was also found to have several episodes of SVT-his baseline dosage of metoprolol was increased as a result. He has an allergy to ace inhibitors but there was a consideration of starting losartan low-dose. He was discharged on Lasix 20 mg every Monday Wednesday Friday.  His diabetes was also discussed in the hospital with plans to try to reduce fried food and sweets foods. He did have some mild hypokalemia in the hospital.  Since discharge, he has seen Dr. Caryl Never due to concern for kidney with pain in his left low back. He had not passed the stone within 3 days and an urgent referral to urology was placed. He updates me that he actually passed 3 stones. He is going to be placed on a medicine to help "dissolve the stones"  apparently they are uric acid related.    Today, he updates me that he saw Dr. Algie Coffer a week after getting out of hospital and plans to see him again in 2 months. No changes were made in medicine at that time. Has been chest pain free. Has had some very mild shortness of breath which Dr. Algie Coffer thought was related to the brilinta.    See problem oriented charting as well ROS- no chest pain. Occasional dizziness. Shortness of breath as noted above.    Past Medical History-  Patient Active Problem List   Diagnosis Date Noted  . Chronic combined systolic and diastolic heart failure (HCC) 10/09/2016    Priority: High  . CAD (coronary artery disease)     Priority: High  . Morbid obesity with BMI of 50.0-59.9, adult (HCC) 07/18/2013    Priority: High  . Acute pulmonary embolism (HCC) 07/13/2013    Priority: High  . Diabetes mellitus type 2, controlled (HCC) 07/17/2010    Priority: High  . Edema 12/08/2008    Priority: High  . Angioedema of lips 07/13/2013    Priority: Medium  . Dyslipidemia 10/02/2011    Priority: Medium  . BPH with urinary obstruction 07/17/2010    Priority: Medium  . Gout 03/15/2007    Priority: Medium  . Essential hypertension 03/15/2007    Priority: Medium  . Erectile dysfunction 07/30/2015    Priority: Low  . SOB (shortness of breath) 10/02/2011    Priority: Low  . Osteoarthritis 03/22/2007  Priority: Low  . Multinodular goiter 07/13/2013  . NEUROPATHY, IDIOPATHIC PERIPHERAL NEC 05/31/2007    Medications- reviewed and updated Current Outpatient Prescriptions  Medication Sig Dispense Refill  . aspirin EC 81 MG tablet Take 81 mg by mouth daily.    Marland Kitchen atorvastatin (LIPITOR) 80 MG tablet Take 1 tablet (80 mg total) by mouth daily at 6 PM. 30 tablet 3  . CINNAMON PO Take 1 capsule by mouth daily.    . furosemide (LASIX) 20 MG tablet Take 1 tablet (20 mg total) by mouth every Monday, Wednesday, and Friday. 15 tablet 3  . HYDROcodone-acetaminophen  (NORCO/VICODIN) 5-325 MG tablet Take 1-2 tablets by mouth every 4 (four) hours as needed for moderate pain. 20 tablet 0  . metoprolol (LOPRESSOR) 50 MG tablet Take 1 tablet (50 mg total) by mouth 2 (two) times daily. 60 tablet 3  . Multiple Vitamin (MULTIVITAMIN WITH MINERALS) TABS tablet Take 1 tablet by mouth daily.    . nitroGLYCERIN (NITROSTAT) 0.4 MG SL tablet Place 1 tablet (0.4 mg total) under the tongue every 5 (five) minutes x 3 doses as needed for chest pain. 25 tablet 1  . ondansetron (ZOFRAN ODT) 8 MG disintegrating tablet Take 1 tablet (8 mg total) by mouth every 8 (eight) hours as needed for nausea or vomiting. 15 tablet 0  . potassium chloride (K-DUR) 10 MEQ tablet Take 1 tablet (10 mEq total) by mouth daily. 30 tablet 6  . terazosin (HYTRIN) 10 MG capsule TAKE 1 CAPSULE (10 MG TOTAL) BY MOUTH AT BEDTIME. 30 capsule 5  . ticagrelor (BRILINTA) 90 MG TABS tablet Take 1 tablet (90 mg total) by mouth 2 (two) times daily. 60 tablet 6  . vitamin B-12 (CYANOCOBALAMIN) 100 MCG tablet Take 100 mcg by mouth daily.     No current facility-administered medications for this visit.     Objective: BP (!) 116/56 (BP Location: Left Arm, Patient Position: Sitting, Cuff Size: Large)   Pulse 77   Temp 98.3 F (36.8 C) (Oral)   Ht 6' (1.829 m)   Wt (!) 389 lb 9.6 oz (176.7 kg)   SpO2 96%   BMI 52.84 kg/m  Gen: NAD, resting comfortably CV: RRR no murmurs rubs or gallops Lungs: CTAB no crackles, wheeze, rhonchi Abdomen: soft/nontender/nondistended/normal bowel sounds.   Ext: severe venous stasis changes. Wears compression stockings. 1+ pitting edema largely unchanged from previous Skin: warm, dry Neuro: walks with 2 canes due to severe arthritis  Assessment/Plan:  Dyslipidemia Update ldl. Atorvastatin up to 80mg . Goal LDL under 70. Only been 2 weeks but suspect may be at goal.   Osteoarthritis Was told to avoid oral nsaids. Will trial voltaren gel if not too expensive  Diabetes  mellitus type 2, controlled (HCC) Refer to diabetes education- also wants to work in his low salt 2000mg  diet.  Lab Results  Component Value Date   HGBA1C 6.7 (H) 09/18/2016  does not have to start meds yet  Chronic combined systolic and diastolic heart failure (HCC) EF 45% and grade II diastolic after nstemi. Lasix MWF at 20mg . Will update electrolytes. Also on potassium and may have further added by urology  CAD (coronary artery disease) Nstemi 08/2016. Dr. Algie Coffer. DES- brilinta and asa. No chest pain- has already seen cardiology and will see again in 2 months  3-4 month CPE  Orders Placed This Encounter  Procedures  . CBC    [place]  . Comprehensive metabolic panel    [place]  . LDL cholesterol, direct  [place]  . Amb Referral to Nutrition and Diabetic E    Referral Priority:   Routine    Referral Type:   Consultation    Referral Reason:   Specialty Services Required    Number of Visits Requested:   1    Meds ordered this encounter  Medications  . diclofenac sodium (VOLTAREN) 1 % GEL    Sig: Apply 2 g topically 4 (four) times daily as needed (arthritis pain).    Dispense:  100 g    Refill:  3    Return precautions advised.  Tana ConchStephen Hunter, MD

## 2016-10-09 NOTE — Assessment & Plan Note (Signed)
Was told to avoid oral nsaids. Will trial voltaren gel if not too expensive

## 2016-10-09 NOTE — Assessment & Plan Note (Signed)
EF 45% and grade II diastolic after nstemi. Lasix MWF at 20mg . Will update electrolytes. Also on potassium and may have further added by urology

## 2016-10-09 NOTE — Assessment & Plan Note (Signed)
Update ldl. Atorvastatin up to 80mg . Goal LDL under 70. Only been 2 weeks but suspect may be at goal.

## 2016-10-09 NOTE — Assessment & Plan Note (Signed)
Nstemi 08/2016. Dr. Algie CofferKadakia. DES- brilinta and asa. No chest pain- has already seen cardiology and will see again in 2 months

## 2016-10-09 NOTE — Progress Notes (Signed)
Pre visit review using our clinic review tool, if applicable. No additional management support is needed unless otherwise documented below in the visit note. 

## 2016-10-10 LAB — CBC
HEMATOCRIT: 41.4 % (ref 39.0–52.0)
HEMOGLOBIN: 13.6 g/dL (ref 13.0–17.0)
MCHC: 32.8 g/dL (ref 30.0–36.0)
MCV: 91.5 fl (ref 78.0–100.0)
Platelets: 274 10*3/uL (ref 150.0–400.0)
RBC: 4.52 Mil/uL (ref 4.22–5.81)
RDW: 14.5 % (ref 11.5–15.5)
WBC: 8.1 10*3/uL (ref 4.0–10.5)

## 2016-10-10 LAB — LDL CHOLESTEROL, DIRECT: Direct LDL: 72 mg/dL

## 2016-10-10 LAB — COMPREHENSIVE METABOLIC PANEL
ALK PHOS: 78 U/L (ref 39–117)
ALT: 27 U/L (ref 0–53)
AST: 21 U/L (ref 0–37)
Albumin: 3.5 g/dL (ref 3.5–5.2)
BUN: 26 mg/dL — AB (ref 6–23)
CHLORIDE: 104 meq/L (ref 96–112)
CO2: 28 mEq/L (ref 19–32)
Calcium: 9 mg/dL (ref 8.4–10.5)
Creatinine, Ser: 1.3 mg/dL (ref 0.40–1.50)
GFR: 70.75 mL/min (ref >60.00)
Glucose, Bld: 110 mg/dL — ABNORMAL HIGH (ref 70–99)
POTASSIUM: 4.3 meq/L (ref 3.5–5.1)
SODIUM: 140 meq/L (ref 135–145)
Total Bilirubin: 0.5 mg/dL (ref 0.2–1.2)
Total Protein: 6.7 g/dL (ref 6.0–8.3)

## 2016-10-27 ENCOUNTER — Telehealth (HOSPITAL_COMMUNITY): Payer: Self-pay | Admitting: Family Medicine

## 2016-10-27 NOTE — Telephone Encounter (Signed)
Verified [employer] Medicare insurance benefits through [employer]port.Marland Kitchen Co-Pay $20.00 No Deductible, No Co-Insurance  Out of Pocket $4000.00, pt has been met $560.00. Pt's balance is $3440.00. Reference # [id number]... KJ

## 2016-11-13 ENCOUNTER — Ambulatory Visit: Payer: Medicare Other | Admitting: Dietician

## 2016-11-21 ENCOUNTER — Ambulatory Visit: Payer: Medicare Other | Admitting: Dietician

## 2016-11-25 NOTE — Progress Notes (Unsigned)
Spoke with patient who states he was scheduled for 11/21/16. He states he went to the office and it was closed. I encouraged him to call and reschedule. He stated he had the telephone number on a letter they had sent him. He stated he would call.

## 2016-11-27 ENCOUNTER — Telehealth: Payer: Self-pay | Admitting: Dietician

## 2016-11-27 NOTE — Telephone Encounter (Signed)
Correction Patient arrived to his nutrition appointment March 31 but at the wrong location.  This visit should not be marked as a no show.  He has a new appointment for 12/16/16.  Oran Rein, RD, LDN [facility]

## 2016-11-28 ENCOUNTER — Telehealth (HOSPITAL_COMMUNITY): Payer: Self-pay | Admitting: Family Medicine

## 2016-11-28 NOTE — Telephone Encounter (Signed)
Faxed request for office notes and recent EKG from Dr. Roseanne Kaufman office on 11/28/16.

## 2016-12-16 ENCOUNTER — Other Ambulatory Visit: Payer: Self-pay | Admitting: Family Medicine

## 2016-12-16 ENCOUNTER — Encounter: Payer: Self-pay | Admitting: Dietician

## 2016-12-16 ENCOUNTER — Encounter: Payer: Medicare Other | Attending: Family Medicine | Admitting: Dietician

## 2016-12-16 DIAGNOSIS — Z713 Dietary counseling and surveillance: Secondary | ICD-10-CM | POA: Diagnosis present

## 2016-12-16 DIAGNOSIS — E119 Type 2 diabetes mellitus without complications: Secondary | ICD-10-CM | POA: Diagnosis not present

## 2016-12-16 NOTE — Progress Notes (Signed)
Diabetes Self-Management Education  Visit Type: First/Initial  Appt. Start Time: 1410 Appt. End Time: 1535  12/16/2016  [name], identified by name and date of birth, is a 66 y.o. male with a diagnosis of Diabetes: Type 2. Other hx includes HTN, gout, recent MI with stent.  He also reports problems with uric acid kidney stones and need to decrease fluid intake per cardiologist and increase pre urologist.  He is on 2 diuretics and a potassium supplement.  He also states that he is following a 2 gm sodium diet.  Patient lives with his wife, sister-in-law, and 2 great grand children.  He helps shop and his wife and sister-in-law cook.  He is a retired Runner, broadcasting/film/video.   He was going to the [facility] and swimming 5 days per week but then developed cellulitis and has not been back in the past 9 months.  He states that he has lost strength since that time.  He walks with 2 canes and it takes increased work to get up.  He appears that he may have increased risk for falls but has not fallen.   ASSESSMENT  Height 6' (1.829 m), weight (!) 369 lb (167.4 kg). Body mass index is 50.05 kg/m.  Wt Readings from Last 3 Encounters:  12/16/16 (!) 369 lb (167.4 kg)  10/09/16 (!) 389 lb 9.6 oz (176.7 kg)  09/26/16 (!) 398 lb (180.5 kg)  Patient with weight loss of 29 lbs in the past 2 1/2 months with change of dietary intake in the past 2 months.      Diabetes Self-Management Education - 12/16/16 1421      Visit Information   Visit Type First/Initial     Initial Visit   Diabetes Type Type 2   Are you currently following a meal plan? No   Are you taking your medications as prescribed? (P)  Not on Medications   Date Diagnosed (P)  2016     Psychosocial Assessment   Patient Belief/Attitude about Diabetes (P)  Other (comment)  don't know how to feel   Self-care barriers (P)  Unsteady gait/risk for falls   Self-management support (P)  Doctor's office;Family   Other persons present (P)  Patient   Patient  Concerns (P)  Nutrition/Meal planning   Special Needs (P)  None   Preferred Learning Style (P)  No preference indicated   Learning Readiness (P)  Contemplating   How often do you need to have someone help you when you read instructions, pamphlets, or other written materials from your doctor or pharmacy? (P)  1 - Never   What is the last grade level you completed in school? (P)  4 years college     Pre-Education Assessment   Patient understands the diabetes disease and treatment process. (P)  Needs Instruction   Patient understands incorporating nutritional management into lifestyle. (P)  Needs Instruction   Patient undertands incorporating physical activity into lifestyle. (P)  Needs Instruction   Patient understands using medications safely. (P)  Needs Instruction   Patient understands monitoring blood glucose, interpreting and using results (P)  Needs Instruction   Patient understands prevention, detection, and treatment of acute complications. (P)  Needs Instruction   Patient understands prevention, detection, and treatment of chronic complications. (P)  Needs Instruction   Patient understands how to develop strategies to address psychosocial issues. (P)  Needs Instruction   Patient understands how to develop strategies to promote health/change behavior. (P)  Needs Instruction     Complications  Last HgB A1C per patient/outside source (P)  6.7 %  09/18/16   How often do you check your blood sugar? (P)  0 times/day (not testing)   Have you had a dilated eye exam in the past 12 months? (P)  No   Have you had a dental exam in the past 12 months? (P)  No   Are you checking your feet? (P)  No     Dietary Intake   Breakfast (P)  1-2 boiled eggs or scrambled in olive oil, with occasional grits, LS salmon OR liverpudding, AND stewed apples and agave nectar or orange  12   Snack (morning) (P)  none   Lunch (P)  none   Snack (afternoon) (P)  unsalted nuts   Dinner (P)  baked chicken or fish  with greens OR salad OR turkey/beef burger on [employer] bun or wrap/torilla, tomato, vegetables, monster cheese  7:30-9   Snack (evening) (P)  cookie   Beverage(s) (P)  water     Exercise   Exercise Type (P)  ADL's     Patient Education   Previous Diabetes Education (P)  No      Individualized Plan for Diabetes Self-Management Training:   Learning Objective:  Patient will have a greater understanding of diabetes self-management. Patient education plan is to attend individual and/or group sessions per assessed needs and concerns.   Plan:   Patient Instructions  Randie Heinz job on the changes that you have made! Consider resuming the pool. Increase your non starchy vegetable intake. Consider decreasing your meat portion size. Continue to follow a low sodium diet. Continue to read labels.     Expected Outcomes:     Education material provided: Living Well with Diabetes, Food label handouts, A1C conversion sheet, Meal plan card, My Plate and Snack sheet, Low Sodium Nutrition Therapy from [facility], Kidney stone nutrition therapy and tip from [facility]  If problems or questions, patient to contact team via:  Phone  Future DSME appointment:    prn

## 2016-12-16 NOTE — Patient Instructions (Addendum)
Great job on the changes that you have made! Consider resuming the pool. Increase your non starchy vegetable intake. Consider decreasing your meat portion size. Continue to follow a low sodium diet. Continue to read labels.

## 2016-12-26 ENCOUNTER — Telehealth: Payer: Self-pay | Admitting: Family Medicine

## 2016-12-29 NOTE — Telephone Encounter (Signed)
error 

## 2017-01-12 ENCOUNTER — Ambulatory Visit (INDEPENDENT_AMBULATORY_CARE_PROVIDER_SITE_OTHER): Payer: Medicare Other | Admitting: Family Medicine

## 2017-01-12 ENCOUNTER — Encounter: Payer: Self-pay | Admitting: Family Medicine

## 2017-01-12 VITALS — BP 128/58 | HR 83 | Temp 98.1°F | Ht 72.0 in | Wt 395.8 lb

## 2017-01-12 DIAGNOSIS — E785 Hyperlipidemia, unspecified: Secondary | ICD-10-CM

## 2017-01-12 DIAGNOSIS — I2510 Atherosclerotic heart disease of native coronary artery without angina pectoris: Secondary | ICD-10-CM

## 2017-01-12 DIAGNOSIS — I10 Essential (primary) hypertension: Secondary | ICD-10-CM | POA: Diagnosis not present

## 2017-01-12 DIAGNOSIS — M549 Dorsalgia, unspecified: Secondary | ICD-10-CM | POA: Diagnosis not present

## 2017-01-12 LAB — POC URINALSYSI DIPSTICK (AUTOMATED)
Bilirubin, UA: NEGATIVE
Blood, UA: NEGATIVE
GLUCOSE UA: NEGATIVE
Ketones, UA: NEGATIVE
Leukocytes, UA: NEGATIVE
Nitrite, UA: NEGATIVE
Protein, UA: NEGATIVE
SPEC GRAV UA: 1.025 (ref 1.010–1.025)
Urobilinogen, UA: 0.2 E.U./dL
pH, UA: 6 (ref 5.0–8.0)

## 2017-01-12 NOTE — Assessment & Plan Note (Signed)
S: well controlled on last check on atorvastatin 80mg  after nstemi. No myalgias.  Lab Results  Component Value Date   CHOL 111 09/21/2016   HDL 35 (L) 09/21/2016   LDLCALC 55 09/21/2016   LDLDIRECT 72.0 10/09/2016   TRIG 104 09/21/2016   CHOLHDL 3.2 09/21/2016   A/P: continue current medications- at goal with LDL uner 55 on last calculated check. On last ldl direct mild elevation- for this side encouraged weight loss for patient but honestly motivation seems low

## 2017-01-12 NOTE — Progress Notes (Signed)
Subjective:  Franklin Hicks is a 66 y.o. year old very pleasant male patient who presents for/with See problem oriented charting ROS- no fever or chills. No nausea or vomiting. Does have right mid to low back pain. No extremity weakness worse from baseline. Does have some incontinence but no recent change  Past Medical History-  Patient Active Problem List   Diagnosis Date Noted  . Chronic combined systolic and diastolic heart failure (HCC) 10/09/2016    Priority: High  . CAD (coronary artery disease)     Priority: High  . Morbid obesity with BMI of 50.0-59.9, adult (HCC) 07/18/2013    Priority: High  . Acute pulmonary embolism (HCC) 07/13/2013    Priority: High  . Diabetes mellitus type 2, controlled (HCC) 07/17/2010    Priority: High  . Edema 12/08/2008    Priority: High  . Angioedema of lips 07/13/2013    Priority: Medium  . Dyslipidemia 10/02/2011    Priority: Medium  . BPH with urinary obstruction 07/17/2010    Priority: Medium  . Gout 03/15/2007    Priority: Medium  . Essential hypertension 03/15/2007    Priority: Medium  . Erectile dysfunction 07/30/2015    Priority: Low  . SOB (shortness of breath) 10/02/2011    Priority: Low  . Osteoarthritis 03/22/2007    Priority: Low  . Multinodular goiter 07/13/2013  . NEUROPATHY, IDIOPATHIC PERIPHERAL NEC 05/31/2007    Medications- reviewed and updated Current Outpatient Prescriptions  Medication Sig Dispense Refill  . aspirin EC 81 MG tablet Take 81 mg by mouth daily.    Marland Kitchen atorvastatin (LIPITOR) 80 MG tablet Take 1 tablet (80 mg total) by mouth daily at 6 PM. 30 tablet 3  . Chromium 200 MCG CAPS Take by mouth.    Marland Kitchen CINNAMON PO Take 1 capsule by mouth daily.    . furosemide (LASIX) 20 MG tablet Take 1 tablet (20 mg total) by mouth every Monday, Wednesday, and Friday. 15 tablet 3  . metoprolol (LOPRESSOR) 50 MG tablet Take 1 tablet (50 mg total) by mouth 2 (two) times daily. 60 tablet 3  . Multiple Vitamin (MULTIVITAMIN WITH  MINERALS) TABS tablet Take 1 tablet by mouth daily.    . Omega-3 Fatty Acids (FISH OIL) 1200 MG CAPS Take by mouth.    . potassium citrate (UROCIT-K) 10 MEQ (1080 MG) SR tablet Take 20 mEq by mouth daily.  6  . terazosin (HYTRIN) 10 MG capsule TAKE 1 CAPSULE (10 MG TOTAL) BY MOUTH AT BEDTIME. 30 capsule 5  . ticagrelor (BRILINTA) 90 MG TABS tablet Take 1 tablet (90 mg total) by mouth 2 (two) times daily. 60 tablet 6  . vitamin B-12 (CYANOCOBALAMIN) 100 MCG tablet Take 100 mcg by mouth daily.    . nitroGLYCERIN (NITROSTAT) 0.4 MG SL tablet Place 1 tablet (0.4 mg total) under the tongue every 5 (five) minutes x 3 doses as needed for chest pain. (Patient not taking: Reported on 12/16/2016) 25 tablet 1   Objective: BP (!) 128/58 (BP Location: Left Arm, Patient Position: Sitting, Cuff Size: Large)   Pulse 83   Temp 98.1 F (36.7 C) (Oral)   Ht 6' (1.829 m)   Wt (!) 395 lb 12.8 oz (179.5 kg)   SpO2 96%   BMI 53.68 kg/m  Gen: NAD, resting comfortably CV: RRR no murmurs rubs or gallops Lungs: CTAB no crackles, wheeze, rhonchi Abdomen: soft/nontender/nondistended/normal bowel sounds. No rebound or guarding.  Ext: no edema Skin: warm, dry  Back - Normal skin,  Spine with normal alignment and no deformity.  No tenderness to vertebral process palpation.  Paraspinous muscles are tender to right of spine with palpation ans spasm noted- no other areas of pain  Neuro- no saddle anesthesia, 5/5 strength lower extremities  Results for orders placed or performed in visit on 01/12/17 (from the past 24 hour(s))  POCT Urinalysis Dipstick (Automated)     Status: None   Collection Time: 01/12/17  4:48 PM  Result Value Ref Range   Color, UA yellow    Clarity, UA clear    Glucose, UA N    Bilirubin, UA N    Ketones, UA N    Spec Grav, UA 1.025 1.010 - 1.025   Blood, UA N    pH, UA 6.0 5.0 - 8.0   Protein, UA N    Urobilinogen, UA 0.2 0.2 or 1.0 E.U./dL   Nitrite, UA N    Leukocytes, UA Negative  Negative   Assessment/Plan:  Mid to low back pain on right side  S: Patient is dealing with lower right sided back pain. Constant pain. About 2/10 pain. Going on for several days.   Wearing pads for incontinence. Has to keep his pants very tight to help keep underwear in position to catch the urine. Issues whether on or off lasix that day. Has vesicare for incontinence but doesn't want to take another medicine regularly so rarely uses.   Had left sided back pain a few months ago due to kidney stones- positive for blood at time. Also on potassium citrate for this.  A/P: muscle spasm on exam- suggested conservative care with ice/heat. He wanted UA to rule out infection and UA unremarkable- will not pursue culture  Essential hypertension S: controlled on Metoprolol 50mg  BID, terazosin 10mg , lasix 20 MWF.  ASCVD 10 year risk calculation if age 35-79: elevated with coronary history- already on statin BP Readings from Last 3 Encounters:  01/12/17 (!) 128/58  10/09/16 (!) 116/56  09/26/16 130/74  A/P: We discussed blood pressure goal of <140/90. Continue current meds as at goal   CAD (coronary artery disease) S: Nstemi 08/2016. Dr. Algie Coffer cared for him with DES- brilinta and asa compliant as well sa statin. Asymptomatic but largely sedentary A/P:Requests change to [facility] cardiology (would prefer to be on epic system)- I have placed this referral for him today   Dyslipidemia S: well controlled on last check on atorvastatin 80mg  after nstemi. No myalgias.  Lab Results  Component Value Date   CHOL 111 09/21/2016   HDL 35 (L) 09/21/2016   LDLCALC 55 09/21/2016   LDLDIRECT 72.0 10/09/2016   TRIG 104 09/21/2016   CHOLHDL 3.2 09/21/2016   A/P: continue current medications- at goal with LDL uner 55 on last calculated check. On last ldl direct mild elevation- for this side encouraged weight loss for patient but honestly motivation seems low  prn follow up for acute issue right mid back pain. We  used this opportunity to discuss other patient concerns about chronic conditions as well  Orders Placed This Encounter  Procedures  . Ambulatory referral to Cardiology    Referral Priority:   Routine    Referral Type:   Consultation    Referral Reason:   Specialty Services Required    Requested Specialty:   Cardiology    Number of Visits Requested:   1  . POCT Urinalysis Dipstick (Automated)    Meds ordered this encounter  Medications  . potassium citrate (UROCIT-K) 10 MEQ (1080 MG) SR  tablet    Sig: Take 20 mEq by mouth daily.    Refill:  6    Return precautions advised.  Tana ConchStephen Hunter, MD

## 2017-01-12 NOTE — Assessment & Plan Note (Addendum)
S: Nstemi 08/2016. Dr. Algie CofferKadakia cared for him with DES- brilinta and asa compliant as well sa statin. Asymptomatic but largely sedentary A/P:[facility] (would prefer to be on epic system)- I have placed this referral for him today

## 2017-01-12 NOTE — Assessment & Plan Note (Signed)
S: controlled on Metoprolol 50mg  BID, terazosin 10mg , lasix 20 MWF.  ASCVD 10 year risk calculation if age 68-79: elevated with coronary history- already on statin BP Readings from Last 3 Encounters:  01/12/17 (!) 128/58  10/09/16 (!) 116/56  09/26/16 130/74  A/P: We discussed blood pressure goal of <140/90. Continue current meds as at goal

## 2017-01-12 NOTE — Patient Instructions (Addendum)
Drop off urine in lab. Will get culture if possible infection. I suspect this is muscle strain. Would try ice for 3 days 20 minutes 3x a day then switch to heat. Can send in muscle relaxant if needed as well.   We will call you within a week or two about [facility]. If you do not hear within 3 weeks, give us a call.

## 2017-01-30 ENCOUNTER — Telehealth: Payer: Self-pay

## 2017-01-30 NOTE — Telephone Encounter (Signed)
Called and spoke to patient regarding Cardiology appointment and that their office has attempted 3 times to contact him to get him scheduled. Patient stated he knew they had called and that he would call them to schedule his appointment. I offered assistance if patient needed help scheduling. He refused at this time. He did state he has their telephone number.

## 2017-02-12 ENCOUNTER — Other Ambulatory Visit: Payer: Self-pay | Admitting: Family Medicine

## 2017-02-13 ENCOUNTER — Encounter: Payer: Self-pay | Admitting: Cardiology

## 2017-02-13 ENCOUNTER — Ambulatory Visit (INDEPENDENT_AMBULATORY_CARE_PROVIDER_SITE_OTHER): Payer: Medicare Other | Admitting: Cardiology

## 2017-02-13 VITALS — BP 116/68 | HR 76 | Ht 72.0 in | Wt 393.0 lb

## 2017-02-13 DIAGNOSIS — E785 Hyperlipidemia, unspecified: Secondary | ICD-10-CM | POA: Diagnosis not present

## 2017-02-13 DIAGNOSIS — Z6841 Body Mass Index (BMI) 40.0 and over, adult: Secondary | ICD-10-CM

## 2017-02-13 DIAGNOSIS — I5042 Chronic combined systolic (congestive) and diastolic (congestive) heart failure: Secondary | ICD-10-CM

## 2017-02-13 DIAGNOSIS — I10 Essential (primary) hypertension: Secondary | ICD-10-CM | POA: Diagnosis not present

## 2017-02-13 DIAGNOSIS — Z9861 Coronary angioplasty status: Secondary | ICD-10-CM

## 2017-02-13 DIAGNOSIS — R600 Localized edema: Secondary | ICD-10-CM | POA: Diagnosis not present

## 2017-02-13 DIAGNOSIS — I2510 Atherosclerotic heart disease of native coronary artery without angina pectoris: Secondary | ICD-10-CM | POA: Diagnosis not present

## 2017-02-13 DIAGNOSIS — I252 Old myocardial infarction: Secondary | ICD-10-CM

## 2017-02-13 DIAGNOSIS — Z86711 Personal history of pulmonary embolism: Secondary | ICD-10-CM

## 2017-02-13 NOTE — Progress Notes (Addendum)
PCP: Shelva Majestic, MD  Clinic Note: Chief Complaint  Patient presents with  . New Patient (Initial Visit)    CAD - nstemi with PCI to RI    HPI: Franklin Hicks is a 66 y.o. male with a PMH below who presents today for Establishing cardiology care. He previously been seen by Dr. Algie Coffer while in the hospital with non-STEMI. He had a DES placed to the ramus intermedius. He requested change to [facility] at the request of Shelva Majestic, MD.  Franklin Hicks was last seen by Dr. Algie Coffer in March  Recent Hospitalizations: none since Jan 2018  Studies Personally Reviewed - (if available, images/films reviewed: From Epic Chart or Care Everywhere)  Cardiac cath 09/19/2016: 95% ramus intermedius;  Resolute Onyx 2.75 x 18 mm drug-eluting stent         2-D Echo 09/19/2016: EF 45-50% with diffuse hypokinesis. GR 2 DD. Mild biatrial enlargement.  Interval History: Franklin Hicks presents here today for establishing a new cardiologist. He really has no cardiac symptoms to speak of but does have multiple questions and concerns about his cardiac disease and how to best manage it. Questions about diet and exercise. Unfortunately his exercise is limited by the fact he has bone-on-bone pain is hoping for having left knee surgery.  As a result, he does have some exertional dyspnea, but he does not have any resting dyspnea or resting or exertional chest tightness or pressure. He has some swelling in the leg, but no real true edema. No PND or orthopnea.  No palpitations, lightheadedness, dizziness, weakness or syncope/near syncope. No TIA/amaurosis fugax symptoms. No melena, hematochezia, hematuria, or epstaxis. No claudication.  ROS: A comprehensive was performed. Review of Systems  Constitutional: Negative for malaise/fatigue and weight loss.  HENT: Negative for congestion.   Respiratory: Negative for sputum production, shortness of breath and wheezing.   Cardiovascular: Positive for leg swelling  (Left knee).  Gastrointestinal: Negative for blood in stool and melena.  Genitourinary: Positive for flank pain (nephrolithiasis) and hematuria (intermittent with nephrolithiasis).  Musculoskeletal: Positive for joint pain (Bone-on-bone knee pain).  Skin: Negative.   Neurological: Negative for dizziness.  Endo/Heme/Allergies: Positive for environmental allergies. Bruises/bleeds easily.  Psychiatric/Behavioral: Negative for depression and memory loss. The patient is not nervous/anxious and does not have insomnia.   All other systems reviewed and are negative.   Epworth Sleepiness Scale: Situation   Chance of Dozing/Sleeping (0 = never , 1 = slight chance , 2 = moderate chance , 3 = high chance )   sitting and reading 1   watching TV 2   sitting inactive in a public place 0    being a passenger in a motor vehicle for an hour or more 2    lying down in the afternoon 3    sitting and talking to someone 1    sitting quietly after lunch (no alcohol) 1    while stopped for a few minutes in traffic as the driver 0    Total Score  10; he snores. Often wakes up with dry mouth/sore throat. Is obese.     I have reviewed and (if needed) personally updated the patient's problem list, medications, allergies, past medical and surgical history, social and family history.   Past Medical History:  Diagnosis Date  . CAD S/P percutaneous coronary angioplasty 09/20/2016   Nstemi 08/2016. Dr. Algie Coffer. DES- brilinta and asa. Requests change to [facility] cardiology; 95% Ramus -> PCI Resolute Onyx DES 2.75 x 18  .  Chronic combined systolic and diastolic heart failure (HCC) 10/09/2016   EF 45% and grade II diastolic after nstemi  . Diabetes mellitus without complication (HCC)   . DJD (degenerative joint disease)   . Gout   . H/O non-ST elevation myocardial infarction (NSTEMI) 09/20/2016   95% Ramus - > PCI   . Hypertension   . Obesity     Past Surgical History:  Procedure Laterality Date  . CARDIAC  CATHETERIZATION N/A 09/19/2016   Procedure: Left Heart Cath and Coronary Angiography;  Surgeon: Orpah Cobb, MD;  Location: [facility] INVASIVE CV LAB;  Service: Cardiovascular: 95% proximal Ramus Intermedius --> PCI  . CARDIAC CATHETERIZATION N/A 09/19/2016   Procedure: Coronary Stent Intervention;  Surgeon: Yvonne Kendall, MD;  Location: [facility] INVASIVE CV LAB;  Service: Cardiovascular: 95% ramus intermedius;  Resolute Onyx 2.75 x 18 mm drug-eluting stent  . left knee open meniscetomy    . TRANSTHORACIC ECHOCARDIOGRAM  2004   no lvh nl ejection fraction  . TRANSTHORACIC ECHOCARDIOGRAM  09/19/2016   In setting of NSTEMI:  EF 45-50% with diffuse hypokinesis. GR 2 DD. Mild biatrial enlargement.  . ureteral stone  9604,5409    Current Meds  Medication Sig  . aspirin EC 81 MG tablet Take 81 mg by mouth daily.  Marland Kitchen atorvastatin (LIPITOR) 80 MG tablet Take 1 tablet (80 mg total) by mouth daily at 6 PM.  . Chromium 200 MCG CAPS Take by mouth.  Marland Kitchen CINNAMON PO Take 1 capsule by mouth daily.  . furosemide (LASIX) 20 MG tablet Take 1 tablet (20 mg total) by mouth every Monday, Wednesday, and Friday.  Boris Lown Oil 1000 MG CAPS Take 1 capsule by mouth daily.  . metoprolol (LOPRESSOR) 50 MG tablet Take 1 tablet (50 mg total) by mouth 2 (two) times daily.  . Multiple Vitamin (MULTIVITAMIN WITH MINERALS) TABS tablet Take 1 tablet by mouth daily.  . nitroGLYCERIN (NITROSTAT) 0.4 MG SL tablet Place 1 tablet (0.4 mg total) under the tongue every 5 (five) minutes x 3 doses as needed for chest pain.  . potassium citrate (UROCIT-K) 10 MEQ (1080 MG) SR tablet Take 20 mEq by mouth daily.  Marland Kitchen terazosin (HYTRIN) 10 MG capsule TAKE 1 CAPSULE (10 MG TOTAL) BY MOUTH AT BEDTIME.  . ticagrelor (BRILINTA) 90 MG TABS tablet Take 1 tablet (90 mg total) by mouth 2 (two) times daily.  . vitamin B-12 (CYANOCOBALAMIN) 100 MCG tablet Take 100 mcg by mouth daily.    Allergies  Allergen Reactions  . Ace Inhibitors     Angioedema  . Other  Hives and Itching    msg    Social History   Social History  . Marital status: Married    Spouse name: N/A  . Number of children: N/A  . Years of education: N/A   Occupational History  . teacher    Social History Main Topics  . Smoking status: Former Smoker    Packs/day: 1.00    Years: 13.00    Types: Cigarettes    Quit date: 07/13/1977  . Smokeless tobacco: Never Used  . Alcohol use 0.0 oz/week     Comment: holidays only  . Drug use: No  . Sexual activity: No   Other Topics Concern  . None   Social History Narrative   Married for 44 years. He has 3 children and 5 grandchildren. They also 5 great grandchildren. He lives with his wife. 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 -> [employer]  Occasional beer or wine   No smoking history or illicit drug use.   One hour water aerobics sessions 3 days a week. Has not started back yet read from recent illness sounds.    family history includes Liver disease in his mother.  Wt Readings from Last 3 Encounters:  02/13/17 (!) 393 lb (178.3 kg)  01/12/17 (!) 395 lb 12.8 oz (179.5 kg)  12/16/16 (!) 369 lb (167.4 kg)  ?? Accuracy - 10/09/2016 - 289 lb   PHYSICAL EXAM BP 116/68   Pulse 76   Ht 6' (1.829 m)   Wt (!) 393 lb (178.3 kg)   BMI 53.30 kg/m  General appearance: alert, cooperative, appears stated age, no distress. Morbidly obese; well grown. Both knees have knee braces HEENT: [place]/AT, EOMI, MMM, anicteric sclera Neck: no adenopathy, no carotid bruit and no JVD Lungs: clear to auscultation bilaterally, normal percussion bilaterally and non-labored Heart: regular rate and rhythm, S1 &S2 normal, no click, rub or gallop; non-displaced PMI; soft ~1-2/6 SEM @ SB. Abdomen: soft, non-tender; bowel sounds normal; no masses; unable to palpate HSM or HJR Extremities: extremities normal, atraumatic, no cyanosis, and edema - tenths, 1+ with spt stockings in place Pulses: 2+ and symmetric; unable to  assess pedal pulses Skin: mobility and turgor normal and no lesions noted  Neurologic: Mental status: Alert & oriented x 3, thought content appropriate; non-focal exam.  Pleasant mood & affect. Cranial nerves: normal (II-XII grossly intact)    Adult ECG Report  Rate: 76 ;  Rhythm: normal sinus rhythm and Nonspecific ST and T-wave changes. Otherwise normal axis, intervals and durations;   Narrative Interpretation: Stable EKG   Other studies Reviewed: Additional studies/ records that were reviewed today include:  Recent Labs:   Lab Results  Component Value Date   CHOL 111 09/21/2016   HDL 35 (L) 09/21/2016   LDLCALC 55 09/21/2016   LDLDIRECT 72.0 10/09/2016   TRIG 104 09/21/2016   CHOLHDL 3.2 09/21/2016    ASSESSMENT / PLAN: Problem List Items Addressed This Visit    Bilateral lower extremity edema (Chronic)    Probably related more to OHS/Obesity & venous stasis as he has not PND/orthopnea to suggest HFpEF. Continue low dose diuretic ~3 d/week & prn. Support stockings.      CAD S/P percutaneous coronary angioplasty - Primary (Chronic)    PCI to RI with DES for NSTEMI in Jan 2018 --   Plan: continue aspirin and Brilinta for now. Okay to hold aspirin for bruising (2-3 days) -- for significant bruising - simply stop ASA  Continue high-dose statin for now.  Continue Toprol      Relevant Orders   EKG 12-Lead   Chronic combined systolic and diastolic heart failure (HCC) (Chronic)    He had grade 2 diastolic dysfunction is setting of non-STEMI with EF 45%.  For now continue 20 mg Lasix as he has edema which to me is probably more related to venous insufficiency and obesity (CHF. I would suspect his EF will probably improve post PCI. We can probably recheck 2-D echo this January. - Continue potassium supplementation with Lasix dosing. If he does take an additional dose of Lasix for edema, he should take potassium as well.      Relevant Orders   EKG 12-Lead   Dyslipidemia,  goal LDL below 70 (Chronic)    Well-controlled lipids in the setting of his MI. I do agree with continue high-dose statin for the atrial effective this time. Goal LDL should be  below 70 if not close to 50. Can probably reduce to 40 mg after one year if lipids still well-controlled - to avoid myalgias.      Essential hypertension (Chronic)    Controlled on twice a day Toprol along with Terrazas and on Lasix. No change -> with current blood pressure, I would not add ACE inhibitor or ARB for now.      H/O non-ST elevation myocardial infarction (NSTEMI) (Chronic)   Relevant Orders   EKG 12-Lead   History of pulmonary embolism (Chronic)    No longer on anticoagulation - was in setting of prolonged travel.      Morbid obesity with BMI of 50.0-59.9, adult (HCC) (Chronic)    Encouraged returning to water exercises. Spent ~10+ min alone simply talking about dietary adjustments etc. We discussed DASH & Mediterranean style diet. We discussed importance of increased exercise to burn calories, but in general reduce portion size. "Smarter " eating as opposed to simply crash dieting.  Did not discuss diagnosis of sleep apnea although his Epworth score was 10. Will address at next visit.         As a new visit, call did not have active symptoms, but had multiple questions and concerns. Total of 50 minutes with them with the patient. Greater than 50% of the time was spent in direct counseling and discussion.  Current medicines are reviewed at length with the patient today. (+/- concerns) n/a The following changes have been made: n/a  Patient Instructions  Medication instruction  May hold aspirin for 3-4 days if you have excessive bruising or bleeding.  if you have to hold it frequently- just stop .   Okay for you  not to take Lasix ( furosemide) on the day you travel , and may take extra if needed.     Your physician wants you to follow-up in DEC 2018 with Dr Herbie BaltimoreHARDING FOR PRE OP CLEARANCE.  You will receive a reminder letter in the mail two months in advance. If you don't receive a letter, please call our office to schedule the follow-up appointment.    Studies Ordered:   Orders Placed This Encounter  Procedures  . EKG 12-Lead      Bryan Lemmaavid Janal Haak, M.D., M.S. Interventional Cardiologist   Pager # [phone] Phone # [phone] [address] [place], [place] [address]

## 2017-02-13 NOTE — Patient Instructions (Addendum)
Medication instruction  May hold aspirin for 3-4 days if you have excessive bruising or bleeding.  if you have to hold it frequently- just stop .   Okay for you  not to take Lasix ( furosemide) on the day you travel , and may take extra if needed.     Your physician wants you to follow-up in DEC 2018 with Dr Herbie BaltimoreHARDING FOR PRE OP CLEARANCE. You will receive a reminder letter in the mail two months in advance. If you don't receive a letter, please call our office to schedule the follow-up appointment.

## 2017-02-15 ENCOUNTER — Encounter: Payer: Self-pay | Admitting: Cardiology

## 2017-02-15 NOTE — Assessment & Plan Note (Signed)
Well-controlled lipids in the setting of his MI. I do agree with continue high-dose statin for the atrial effective this time. Goal LDL should be below 70 if not close to 50. Can probably reduce to 40 mg after one year if lipids still well-controlled - to avoid myalgias.

## 2017-02-15 NOTE — Assessment & Plan Note (Signed)
No longer on anticoagulation - was in setting of prolonged travel.

## 2017-02-15 NOTE — Assessment & Plan Note (Signed)
He had grade 2 diastolic dysfunction is setting of non-STEMI with EF 45%.  For now continue 20 mg Lasix as he has edema which to me is probably more related to venous insufficiency and obesity (CHF. I would suspect his EF will probably improve post PCI. We can probably recheck 2-D echo this January. - Continue potassium supplementation with Lasix dosing. If he does take an additional dose of Lasix for edema, he should take potassium as well.

## 2017-02-15 NOTE — Assessment & Plan Note (Signed)
PCI to RI with DES for NSTEMI in Jan 2018 --   Plan: continue aspirin and Brilinta for now. Okay to hold aspirin for bruising (2-3 days) -- for significant bruising - simply stop ASA  Continue high-dose statin for now.  Continue Toprol

## 2017-02-15 NOTE — Assessment & Plan Note (Addendum)
Probably related more to OHS/Obesity & venous stasis as he has not PND/orthopnea to suggest HFpEF. Continue low dose diuretic ~3 d/week & prn. Support stockings.

## 2017-02-15 NOTE — Assessment & Plan Note (Signed)
Controlled on twice a day Toprol along with Terrazas and on Lasix. No change -> with current blood pressure, I would not add ACE inhibitor or ARB for now.

## 2017-02-15 NOTE — Assessment & Plan Note (Addendum)
Encouraged returning to water exercises. Spent ~10+ min alone simply talking about dietary adjustments etc. We discussed DASH & Mediterranean style diet. We discussed importance of increased exercise to burn calories, but in general reduce portion size. "Smarter " eating as opposed to simply crash dieting.  Did not discuss diagnosis of sleep apnea although his Epworth score was 10. Will address at next visit.

## 2017-02-16 ENCOUNTER — Telehealth (HOSPITAL_COMMUNITY): Payer: Self-pay

## 2017-02-16 NOTE — Telephone Encounter (Signed)
I called patient to discuss if he was interested in scheduling cardiac rehab. Patient declined cardiac rehab. Referral canceled.

## 2017-07-20 ENCOUNTER — Telehealth: Payer: Self-pay | Admitting: Cardiology

## 2017-07-20 MED ORDER — METOPROLOL TARTRATE 50 MG PO TABS: 50.0000 mg | ORAL_TABLET | Freq: Two times a day (BID) | ORAL | 3 refills | Status: DC

## 2017-07-20 MED ORDER — FUROSEMIDE 20 MG PO TABS: 20.0000 mg | ORAL_TABLET | ORAL | 3 refills | Status: DC

## 2017-07-20 NOTE — Telephone Encounter (Signed)
Refill sent to the pharmacy electronically.  

## 2017-07-20 NOTE — Telephone Encounter (Signed)
°*  STAT* If patient is at the pharmacy, call can be transferred to refill team.   1. Which medications need to be refilled? (please list name of each medication and dose if known) metoprolol 50 mg and furosemide 20 mg  2. Which pharmacy/location (including street and city if local pharmacy) is medication to be sent to? [id number] - [place], [place] - [address]  3. Do they need a 30 day or 90 day supply? 30

## 2017-07-27 NOTE — Telephone Encounter (Signed)
New message     1) Are you calling to confirm a diagnosis or obtain personal health information (Y/N)? Yes  Most recent blood pressure and ejection fracture from January echo   2) If so, what information is requested?  Leave message on voicemail, it is confidential   Please route to Medical Records or your medical records site representative

## 2017-07-28 ENCOUNTER — Encounter: Payer: Self-pay | Admitting: Cardiology

## 2017-07-28 ENCOUNTER — Ambulatory Visit: Payer: Medicare Other | Admitting: Cardiology

## 2017-07-28 VITALS — BP 124/62 | HR 70 | Ht 72.0 in | Wt 389.0 lb

## 2017-07-28 DIAGNOSIS — Z9861 Coronary angioplasty status: Secondary | ICD-10-CM

## 2017-07-28 DIAGNOSIS — R0602 Shortness of breath: Secondary | ICD-10-CM

## 2017-07-28 DIAGNOSIS — I5042 Chronic combined systolic (congestive) and diastolic (congestive) heart failure: Secondary | ICD-10-CM

## 2017-07-28 DIAGNOSIS — R600 Localized edema: Secondary | ICD-10-CM | POA: Diagnosis not present

## 2017-07-28 DIAGNOSIS — I2510 Atherosclerotic heart disease of native coronary artery without angina pectoris: Secondary | ICD-10-CM | POA: Diagnosis not present

## 2017-07-28 DIAGNOSIS — E785 Hyperlipidemia, unspecified: Secondary | ICD-10-CM

## 2017-07-28 DIAGNOSIS — Z6841 Body Mass Index (BMI) 40.0 and over, adult: Secondary | ICD-10-CM | POA: Diagnosis not present

## 2017-07-28 DIAGNOSIS — I10 Essential (primary) hypertension: Secondary | ICD-10-CM | POA: Diagnosis not present

## 2017-07-28 MED ORDER — CLOPIDOGREL BISULFATE 75 MG PO TABS: 75.0000 mg | ORAL_TABLET | Freq: Every day | ORAL | 11 refills | Status: DC

## 2017-07-28 NOTE — Patient Instructions (Signed)
MEDICATION CHANGES   --STARTING IN JAN 2019 STOP BRILINTA  ----START TAKING CLOPIDOGREL 75 MG ONE TABLET DAILY.   LABS - NOTHING TO EAT OR DRINK THE MORNING OF THE TEST CMP  LIPID    CALL OFFICE IF YOU NEED CARDIAC CLEARANCE FOR KNEE SURGERY     Your physician wants you to follow-up in 6 MONTH VISIT WITH DR Herbie BaltimoreHARDING You will receive a reminder letter in the mail two months in advance. If you don't receive a letter, please call our office to schedule the follow-up appointment.   If you need a refill on your cardiac medications before your next appointment, please call your pharmacy.

## 2017-07-28 NOTE — Telephone Encounter (Signed)
LM with EF from Jan 2018 echo, BP from Jan 2018 echo, and most recent BP from June 2018 OV

## 2017-07-28 NOTE — Progress Notes (Addendum)
PCP: Franklin Hicks, Franklin O, MD  Clinic Note: Chief Complaint  Patient presents with  . Follow-up    Pre-op clearance.  . Edema    Legs, feet. and ankles.  . Coronary Artery Disease    HPI: Franklin Hicks is a 66 y.Hicks. male with a PMH below who presents today for 6 month follow up after transferring cardiology care back in June . He previously been seen by Dr. Algie CofferKadakia while in the hospital with non-STEMI. He had a DES placed to the ramus intermedius. He requested [facility] to [facility] at the request of Franklin Hicks, Franklin O, MD.  Since his MI, he has not had any further recurrent cardiac symptoms to speak of but does have multiple questions and concerns about his cardiac disease and how to best manage it. Questions about diet and exercise. Unfortunately his exercise is limited by the fact he has bone-on-bone pain is hoping for having left knee surgery.   Franklin LippsCarl J Hicks was last in June.  Doing well from a cardiac standpoint with no active symptoms.  Recent Hospitalizations: none since Jan 2018  Studies Personally Reviewed - (if available, images/films reviewed: From Epic Chart or Care Everywhere)  none  Interval History: Franklin AshCarl presents here today for follow-up --again really without any significant complaints.  He still has some occasional tingling sensations symptoms in his left arm, but otherwise has not had any significant limiting issues --this symptom actually Feels Similar to When He Had a "Stinger/Pinched Nerve" Back in high school when he is playing sports.  He has chronic pain in both knees that is getting persistently worse.  Left is much worse than the right.  As such, he does get short of breath trying to activity also because of knee pain.  He therefore also has swelling more on the left leg and right, but is doing better with support stockings.  The swelling is now easily manageable on low-dose of furosemide which she is only taken 3 times a day.  His only on occasion take an additional  dosing. He has not had any recurrent anginal symptoms at rest or exertion.   He does have exertional dyspnea as noted partially because of his knee pain and deconditioning, but this is symptom that has predated his MI. He has no heart failure symptoms of PND, orthopnea to go along with his edema.  It really is more swelling in the entire leg from his knee pain than true edema as it is left greater than right.  He denies any rapid irregular heartbeats or palpitations.  No lightheadedness, dizziness, wooziness,syncope/near syncope, or TIA/amaurosis fugax.  He has bilateral pain in his legs with walking, but not related to claudication.  ROS: A comprehensive was performed. Review of Systems  Constitutional: Negative for malaise/fatigue and weight loss.  HENT: Negative for congestion.   Respiratory: Negative for sputum production, shortness of breath and wheezing.   Cardiovascular: Positive for leg swelling (Left knee).  Gastrointestinal: Negative for blood in stool and melena.  Genitourinary: Positive for flank pain (nephrolithiasis) and hematuria (intermittent with nephrolithiasis).  Musculoskeletal: Positive for joint pain (Bone-on-bone knee pain).  Skin: Negative.   Neurological: Negative for dizziness.  Endo/Heme/Allergies: Positive for environmental allergies. Bruises/bleeds easily.  Psychiatric/Behavioral: Negative for depression and memory loss. The patient is not nervous/anxious and does not have insomnia.   All other systems reviewed and are negative.  Epworth Sleepiness Scale: Situation   Chance of Dozing/Sleeping (0 = never , 1 = slight chance , 2 =  moderate chance , 3 = high chance )   sitting and reading 1   watching TV 2   sitting inactive in a public place 0    being a passenger in a motor vehicle for an hour or more 2    lying down in the afternoon 3    sitting and talking to someone 1    sitting quietly after lunch (no alcohol) 1    while stopped for a few minutes in  traffic as the driver 0    Total Score  10; he snores. Often wakes up with dry mouth/sore throat. Is obese.     I have reviewed and (if needed) personally updated the patient's problem list, medications, allergies, past medical and surgical history, social and family history.   Past Medical History:  Diagnosis Date  . CAD S/P percutaneous coronary angioplasty 09/20/2016   Nstemi 08/2016. Dr. Algie Coffer. DES- brilinta and asa. Requests change to [facility] cardiology; 95% Ramus -> PCI Resolute Onyx DES 2.75 x 18  . Chronic combined systolic and diastolic heart failure (HCC) 10/09/2016   EF 45% and grade II diastolic after nstemi  . Diabetes mellitus without complication (HCC)   . DJD (degenerative joint disease)   . Gout   . H/Hicks non-ST elevation myocardial infarction (NSTEMI) 09/20/2016   95% Ramus - > PCI   . Hypertension   . Obesity     Past Surgical History:  Procedure Laterality Date  . CARDIAC CATHETERIZATION N/A 09/19/2016   Procedure: Left Heart Cath and Coronary Angiography;  Surgeon: Orpah Cobb, MD;  Location: [facility] INVASIVE CV LAB;  Service: Cardiovascular: 95% proximal Ramus Intermedius --> PCI  . CARDIAC CATHETERIZATION N/A 09/19/2016   Procedure: Coronary Stent Intervention;  Surgeon: Franklin Kendall, MD;  Location: [facility] INVASIVE CV LAB;  Service: Cardiovascular: 95% ramus intermedius;  Resolute Onyx 2.75 x 18 mm drug-eluting stent  . left knee open meniscetomy    . TRANSTHORACIC ECHOCARDIOGRAM  2004   no lvh nl ejection fraction  . TRANSTHORACIC ECHOCARDIOGRAM  09/19/2016   In setting of NSTEMI:  EF 45-50% with diffuse hypokinesis. GR 2 DD. Mild biatrial enlargement.  . ureteral stone  1610,9604    Cardiac cath 09/19/2016: 95% ramus intermedius;  Resolute Onyx 2.75 x 18 mm drug-eluting stent        2-D Echo 09/19/2016: EF 45-50% with diffuse hypokinesis. GR 2 DD. Mild biatrial enlargement.   Current Meds  Medication Sig  . aspirin EC 81 MG tablet Take 81 mg by mouth daily.  Marland Kitchen  atorvastatin (LIPITOR) 80 MG tablet Take 1 tablet (80 mg total) by mouth daily at 6 PM.  . Chromium 200 MCG CAPS Take by mouth.  Marland Kitchen CINNAMON PO Take 1 capsule by mouth daily.  . furosemide (LASIX) 20 MG tablet Take 1 tablet (20 mg total) by mouth every Monday, Wednesday, and Friday.  Boris Lown Oil 1000 MG CAPS Take 1 capsule by mouth daily.  . metoprolol tartrate (LOPRESSOR) 50 MG tablet Take 1 tablet (50 mg total) by mouth 2 (two) times daily.  . Multiple Vitamin (MULTIVITAMIN WITH MINERALS) TABS tablet Take 1 tablet by mouth daily.  . nitroGLYCERIN (NITROSTAT) 0.4 MG SL tablet Place 1 tablet (0.4 mg total) under the tongue every 5 (five) minutes x 3 doses as needed for chest pain.  . potassium citrate (UROCIT-K) 10 MEQ (1080 MG) SR tablet Take 15 mEq by mouth daily.   Marland Kitchen terazosin (HYTRIN) 10 MG capsule TAKE 1 CAPSULE (10 MG TOTAL) BY  MOUTH AT BEDTIME.  . vitamin B-12 (CYANOCOBALAMIN) 100 MCG tablet Take 100 mcg by mouth daily.  . [DISCONTINUED] ticagrelor (BRILINTA) 90 MG TABS tablet Take 1 tablet (90 mg total) by mouth 2 (two) times daily.  - not on ASA  Allergies  Allergen Reactions  . Ace Inhibitors     Angioedema  . Other Hives and Itching    msg   Social History   Tobacco Use  . Smoking status: Former Smoker    Packs/day: 1.00    Years: 13.00    Pack years: 13.00    Types: Cigarettes    Last attempt to quit: 07/13/1977    Years since quitting: 40.0  . Smokeless tobacco: Never Used  Substance Use Topics  . Alcohol use: Yes    Alcohol/week: 0.0 oz    Comment: holidays only  . Drug use: No   Social History   Social History Narrative   Married for 44 years. He has 3 children and 5 grandchildren. They also 5 great grandchildren. He lives with his wife. 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 -> [employer] education   Occasional beer or wine   No smoking history or illicit drug use.   One hour water aerobics sessions 3 days a week. Has not started back  yet read from recent illness sounds.  . family history includes Liver disease in his mother.  Wt Readings from Last 3 Encounters:  07/28/17 (!) 389 lb (176.4 kg)  02/13/17 (!) 393 lb (178.3 kg)  01/12/17 (!) 395 lb 12.8 oz (179.5 kg)     PHYSICAL EXAM BP 124/62   Pulse 70   Ht 6' (1.829 m)   Wt (!) 389 lb (176.4 kg)   BMI 52.76 kg/m   Physical Exam  Constitutional: He is oriented to person, place, and time. He appears well-developed and well-nourished.  Well-groomed.  Super morbidly obese  HENT:  Head: Normocephalic and atraumatic.  Mouth/Throat: Oropharynx is clear and moist. No oropharyngeal exudate.  Neck: No hepatojugular reflux and no JVD (Unable to palpate) present. Carotid bruit is not present.  Cardiovascular: Normal rate, regular rhythm and intact distal pulses. Exam reveals no gallop and no friction rub.  Murmur heard.  Crescendo-decrescendo early systolic murmur is present with a grade of 1/6 at the upper right sternal border and upper left sternal border. Does not radiate Very distant heart sounds.  Unable to palpate PMI  Pulmonary/Chest: Effort normal and breath sounds normal. No respiratory distress. He has no wheezes. He has no rales.  Abdominal: Soft. Bowel sounds are normal. He exhibits no distension. There is no tenderness. There is no rebound.  Morbid obesity  Musculoskeletal: He exhibits edema (~2+ tense swelling LLE below knee,Tr-1+ RLE.  Support stockings in place) and deformity (Left knee appears to be more swollen than the rest of the leg.  Very stiff and painful).  Neurological: He is alert and oriented to person, place, and time.  Psychiatric: He has a normal mood and affect. His behavior is normal. Judgment and thought content normal.  Nursing note and vitals reviewed.   Adult ECG Report  Rate: 70 ;  Rhythm: normal sinus rhythm and Nonspecific ST and T-wave changes. Otherwise normal axis, intervals and durations;   Narrative Interpretation: Stable  EKG   Other studies Reviewed: Additional studies/ records that were reviewed today include:  Recent Labs:   Lab Results  Component Value Date   CHOL 111 09/21/2016   HDL 35 (L) 09/21/2016  LDLCALC 55 09/21/2016   LDLDIRECT 72.0 10/09/2016   TRIG 104 09/21/2016   CHOLHDL 3.2 09/21/2016    ASSESSMENT / PLAN: Problem List Items Addressed This Visit    Bilateral lower extremity edema (Chronic)    Probably is much related to OHS obesity with some venous insufficiency.  Not likely related to left-sided heart failure with no PND or orthopnea. Continue support stockings and as needed Lasix.      CAD S/P percutaneous coronary angioplasty - Primary (Chronic)    Non-STEMI in January 2018 with PCI to the Ramus Intermedius using DES stent. Currently on Aspirin plus Brilinta.  However he is not taking aspirin as listed.  This is based on a prior discussion. We can switch him from Brilinta to Plavix in January. ---> Continue statin and Toprol.      Relevant Orders   EKG 12-Lead (Completed)   Lipid panel   Comprehensive metabolic panel   Chronic combined systolic and diastolic heart failure (HCC) (Chronic)    Mostly diastolic dysfunction mediated symptoms for exertional dyspnea or mild edema.  EF is 45-50%.  No real active heart failure symptoms besides the edema.  He is on low-dose Lasix along with standing dose of Toprol.  Monitor blood pressures, if these tend to increase would consider ARB.      Relevant Orders   EKG 12-Lead (Completed)   Dyslipidemia, goal LDL below 70 (Chronic)    His LDL was pretty much at goal back in January.  He is due for follow-up labs --will probably check them sometime in January.  For now continue current dose of statin.  No myalgias associated with it.  However if labs continue to look well controlled on 80 mg, we can probably reduce to 40 mg atorvastatin.      Relevant Orders   Lipid panel   Comprehensive metabolic panel   Essential hypertension  (Chronic)    Controlled on twice daily Toprol along with terazosin and Lasix  Continue current medications.  At this point I would still be reluctant to add ACE inhibitor or ARB      Morbid obesity with BMI of 50.0-59.9, adult (HCC) (Chronic)    I encouraged him getting back into water exercises.  Needs to watch his diet. Hopefully if he can get his knee fixed, he will be L to get back into doing some rehab exercise and then continue to exercise.  He needs to eat smarter with smaller portion size, less starchy foods as well as animal products.  Concern for possible OSA.      SOB (shortness of breath)    Actually seems to be better now.  Trying to be active.  Just the knee hurting him bothers him more than anything else        At the end of the visit, he indicated that he open develops get into see the orthotic surgeon to discuss possible knee surgery.  Preoperative preoperative risk evaluation, but would prefer him to wait till January at which point he safe to stop antiplatelet agent.  If it is more than 4-6 months out from his visit, we he would need to be re-seen for preoperative evaluation, otherwise I would feel comfortable providing plans.  Current medicines are reviewed at length with the patient today. (+/- concerns) n/a The following changes have been made: n/a  Patient Instructions  MEDICATION CHANGES   --STARTING IN JAN 2019 STOP BRILINTA  ----START TAKING CLOPIDOGREL 75 MG ONE TABLET DAILY.   LABS -  NOTHING TO EAT OR DRINK THE MORNING OF THE TEST CMP  LIPID    CALL OFFICE IF YOU NEED CARDIAC CLEARANCE FOR KNEE SURGERY     Your physician wants you to follow-up in 6 MONTH VISIT WITH DR Herbie Baltimore You will receive a reminder letter in the mail two months in advance. If you don't receive a letter, please call our office to schedule the follow-up appointment.   If you need a refill on your cardiac medications before your next appointment, please call your  pharmacy.    Studies Ordered:   Orders Placed This Encounter  Procedures  . Lipid panel  . Comprehensive metabolic panel  . EKG 12-Lead      Bryan Lemma, M.D., M.S. Interventional Cardiologist   Pager # [phone] Phone # [phone] [address] [place], [place] [address]

## 2017-07-31 ENCOUNTER — Encounter: Payer: Self-pay | Admitting: Cardiology

## 2017-07-31 NOTE — Assessment & Plan Note (Signed)
Actually seems to be better now.  Trying to be active.  Just the knee hurting him bothers him more than anything else

## 2017-07-31 NOTE — Assessment & Plan Note (Addendum)
His LDL was pretty much at goal back in January.  He is due for follow-up labs --will probably check them sometime in January.  For now continue current dose of statin.  No myalgias associated with it.  However if labs continue to look well controlled on 80 mg, we can probably reduce to 40 mg atorvastatin.

## 2017-07-31 NOTE — Assessment & Plan Note (Signed)
Probably is much related to OHS obesity with some venous insufficiency.  Not likely related to left-sided heart failure with no PND or orthopnea. Continue support stockings and as needed Lasix.

## 2017-07-31 NOTE — Assessment & Plan Note (Signed)
Mostly diastolic dysfunction mediated symptoms for exertional dyspnea or mild edema.  EF is 45-50%.  No real active heart failure symptoms besides the edema.  He is on low-dose Lasix along with standing dose of Toprol.  Monitor blood pressures, if these tend to increase would consider ARB.

## 2017-07-31 NOTE — Assessment & Plan Note (Signed)
Non-STEMI in January 2018 with PCI to the Ramus Intermedius using DES stent. Currently on Aspirin plus Brilinta.  However he is not taking aspirin as listed.  This is based on a prior discussion. We can switch him from Brilinta to Plavix in January. ---> Continue statin and Toprol.

## 2017-07-31 NOTE — Assessment & Plan Note (Signed)
Controlled on twice daily Toprol along with terazosin and Lasix  Continue current medications.  At this point I would still be reluctant to add ACE inhibitor or ARB

## 2017-07-31 NOTE — Assessment & Plan Note (Signed)
I encouraged him getting back into water exercises.  Needs to watch his diet. Hopefully if he can get his knee fixed, he will be L to get back into doing some rehab exercise and then continue to exercise.  He needs to eat smarter with smaller portion size, less starchy foods as well as animal products.  Concern for possible OSA.

## 2017-08-03 ENCOUNTER — Encounter: Payer: Medicare Other | Admitting: Family Medicine

## 2017-09-01 ENCOUNTER — Other Ambulatory Visit: Payer: Self-pay

## 2017-09-01 MED ORDER — TERAZOSIN HCL 10 MG PO CAPS: ORAL_CAPSULE | ORAL | 5 refills | Status: DC

## 2017-09-24 ENCOUNTER — Ambulatory Visit (INDEPENDENT_AMBULATORY_CARE_PROVIDER_SITE_OTHER): Payer: Medicare Other | Admitting: Family Medicine

## 2017-09-24 ENCOUNTER — Encounter: Payer: Self-pay | Admitting: Family Medicine

## 2017-09-24 VITALS — BP 106/76 | HR 67 | Temp 98.1°F | Ht 72.0 in | Wt 352.8 lb

## 2017-09-24 DIAGNOSIS — I10 Essential (primary) hypertension: Secondary | ICD-10-CM

## 2017-09-24 DIAGNOSIS — R600 Localized edema: Secondary | ICD-10-CM | POA: Diagnosis not present

## 2017-09-24 DIAGNOSIS — E119 Type 2 diabetes mellitus without complications: Secondary | ICD-10-CM

## 2017-09-24 DIAGNOSIS — E785 Hyperlipidemia, unspecified: Secondary | ICD-10-CM

## 2017-09-24 DIAGNOSIS — Z9861 Coronary angioplasty status: Secondary | ICD-10-CM

## 2017-09-24 DIAGNOSIS — I2510 Atherosclerotic heart disease of native coronary artery without angina pectoris: Secondary | ICD-10-CM | POA: Diagnosis not present

## 2017-09-24 DIAGNOSIS — Z6841 Body Mass Index (BMI) 40.0 and over, adult: Secondary | ICD-10-CM

## 2017-09-24 DIAGNOSIS — N138 Other obstructive and reflux uropathy: Secondary | ICD-10-CM

## 2017-09-24 DIAGNOSIS — N401 Enlarged prostate with lower urinary tract symptoms: Secondary | ICD-10-CM

## 2017-09-24 DIAGNOSIS — E042 Nontoxic multinodular goiter: Secondary | ICD-10-CM | POA: Diagnosis not present

## 2017-09-24 DIAGNOSIS — I5042 Chronic combined systolic (congestive) and diastolic (congestive) heart failure: Secondary | ICD-10-CM

## 2017-09-24 DIAGNOSIS — M1A9XX0 Chronic gout, unspecified, without tophus (tophi): Secondary | ICD-10-CM | POA: Diagnosis not present

## 2017-09-24 DIAGNOSIS — Z0000 Encounter for general adult medical examination without abnormal findings: Secondary | ICD-10-CM

## 2017-09-24 DIAGNOSIS — Z23 Encounter for immunization: Secondary | ICD-10-CM | POA: Diagnosis not present

## 2017-09-24 NOTE — Assessment & Plan Note (Signed)
SYstolic and diastolic CHF- euvolemic. Great job with weight loss! Still has edema - Dr. Herbie BaltimoreHarding thinks edema is more related to weight and venous insufficiency than heart failure

## 2017-09-24 NOTE — Addendum Note (Signed)
Addended by: Vicente MalesSOUTHERN HIZER, Anastashia Westerfeld M on: 09/24/2017 03:32 PM   Modules accepted: Orders

## 2017-09-24 NOTE — Assessment & Plan Note (Signed)
S:  controlled. On no rx on last check. He no showed for diabetes education  Lab Results  Component Value Date   HGBA1C 6.7 (H) 09/18/2016   HGBA1C 6.4 07/30/2015   HGBA1C 6.7 (H) 12/26/2014   A/P: update a1c today - advised eye exam needed - check urine microalbumin/cr ratio- sadly have to avoid ace and ARb with angioedema history

## 2017-09-24 NOTE — Assessment & Plan Note (Signed)
CAD- NSTEMI 08/2016. [facility] after initial care by Dr. Algie CofferKadakia who placed DES and placed him on asa, statin, brilinta--> now on plavix and aspirin instead

## 2017-09-24 NOTE — Assessment & Plan Note (Signed)
Has been losing weight but wonder about error on measurement today- will not change BMI until next visit. Advised continued healthier eating- exercise is hard for him as he depends on 2 canes for walking

## 2017-09-24 NOTE — Assessment & Plan Note (Signed)
Noted CT 2014- when checking for PE. Update tsh

## 2017-09-24 NOTE — Patient Instructions (Addendum)
Please stop by lab before you go  Please get your diabetic eye exam soon and have them send us a copy  Could get shingrix at pharmacy- reduces your risk of getting shingles by about 90%  Franklin MuirJamie will set you up for cologuard  Great job eating better- lets check back in 4-6 months to continue to trend your weight. Possible this was falsely low today  Flu shot today

## 2017-09-24 NOTE — Progress Notes (Signed)
Phone: [phone]  Subjective:  Patient presents today for their annual physical. Chief complaint-noted.   See problem oriented charting- ROS- full  review of systems was completed and negative except for: leg swelling, urinary urgency, intermittent diarrhea  The following were reviewed and entered/updated in epic: Past Medical History:  Diagnosis Date  . CAD S/P percutaneous coronary angioplasty 09/20/2016   Nstemi 08/2016. Dr. Algie CofferKadakia. DES- brilinta and asa. [facility]; 95% Ramus -> PCI Resolute Onyx DES 2.75 x 18  . Chronic combined systolic and diastolic heart failure (HCC) 10/09/2016   EF 45% and grade II diastolic after nstemi  . Diabetes mellitus without complication (HCC)   . DJD (degenerative joint disease)   . Gout   . H/O non-ST elevation myocardial infarction (NSTEMI) 09/20/2016   95% Ramus - > PCI   . Hypertension   . Obesity    Patient Active Problem List   Diagnosis Date Noted  . Chronic combined systolic and diastolic heart failure (HCC) 10/09/2016    Priority: High  . CAD S/P percutaneous coronary angioplasty 09/20/2016    Priority: High  . Morbid obesity with BMI of 50.0-59.9, adult (HCC) 07/18/2013    Priority: High  . History of pulmonary embolism 07/13/2013    Priority: High  . Diabetes mellitus type 2, controlled (HCC) 07/17/2010    Priority: High  . Bilateral lower extremity edema 12/08/2008    Priority: High  . Angioedema of lips 07/13/2013    Priority: Medium  . Dyslipidemia, goal LDL below 70 10/02/2011    Priority: Medium  . BPH with urinary obstruction 07/17/2010    Priority: Medium  . Gout 03/15/2007    Priority: Medium  . Essential hypertension 03/15/2007    Priority: Medium  . Erectile dysfunction 07/30/2015    Priority: Low  . SOB (shortness of breath) 10/02/2011    Priority: Low  . Osteoarthritis 03/22/2007    Priority: Low  . H/O non-ST elevation myocardial infarction (NSTEMI) 09/20/2016  . Multinodular  goiter 07/13/2013  . NEUROPATHY, IDIOPATHIC PERIPHERAL NEC 05/31/2007   Past Surgical History:  Procedure Laterality Date  . CARDIAC CATHETERIZATION N/A 09/19/2016   Procedure: Left Heart Cath and Coronary Angiography;  Surgeon: Orpah CobbAjay Kadakia, MD;  Location: [facility] INVASIVE CV LAB;  Service: Cardiovascular: 95% proximal Ramus Intermedius --> PCI  . CARDIAC CATHETERIZATION N/A 09/19/2016   Procedure: Coronary Stent Intervention;  Surgeon: Yvonne Kendallhristopher End, MD;  [facility];  Service: Cardiovascular: 95% ramus intermedius;  Resolute Onyx 2.75 x 18 mm drug-eluting stent  . left knee open meniscetomy    . TRANSTHORACIC ECHOCARDIOGRAM  2004   no lvh nl ejection fraction  . TRANSTHORACIC ECHOCARDIOGRAM  09/19/2016   In setting of NSTEMI:  EF 45-50% with diffuse hypokinesis. GR 2 DD. Mild biatrial enlargement.  . ureteral stone  [id number],1999    Family History  Problem Relation Age of Onset  . Liver disease Mother     Medications- reviewed and updated Current Outpatient Medications  Medication Sig Dispense Refill  . aspirin EC 81 MG tablet Take 81 mg by mouth daily.    Marland Kitchen. atorvastatin (LIPITOR) 80 MG tablet Take 1 tablet (80 mg total) by mouth daily at 6 PM. 30 tablet 3  . Chromium 200 MCG CAPS Take by mouth.    Marland Kitchen. CINNAMON PO Take 1 capsule by mouth daily.    . clopidogrel (PLAVIX) 75 MG tablet Take 1 tablet (75 mg total) by mouth daily. 30 tablet 11  . furosemide (  LASIX) 20 MG tablet Take 1 tablet (20 mg total) by mouth every Monday, Wednesday, and Friday. 15 tablet 3  . Krill Oil 1000 MG CAPS Take 1 capsule by mouth daily.    . metoprolol tartrate (LOPRESSOR) 50 MG tablet Take 1 tablet (50 mg total) by mouth 2 (two) times daily. 60 tablet 3  . Multiple Vitamin (MULTIVITAMIN WITH MINERALS) TABS tablet Take 1 tablet by mouth daily.    . Potassium Citrate 15 MEQ (1620 MG) TBCR Take 1 tablet by mouth 2 (two) times daily.  11  . terazosin (HYTRIN) 10 MG capsule TAKE 1 CAPSULE (10 MG  TOTAL) BY MOUTH AT BEDTIME. (Patient taking differently: Take 15 mg by mouth at bedtime. TAKE 1 CAPSULE (10 MG TOTAL) BY MOUTH AT BEDTIME.) 30 capsule 5  . vitamin B-12 (CYANOCOBALAMIN) 100 MCG tablet Take 100 mcg by mouth daily.    . nitroGLYCERIN (NITROSTAT) 0.4 MG SL tablet Place 1 tablet (0.4 mg total) under the tongue every 5 (five) minutes x 3 doses as needed for chest pain. (Patient not taking: Reported on 09/24/2017) 25 tablet 1   No current facility-administered medications for this visit.     Allergies-reviewed and updated Allergies  Allergen Reactions  . Ace Inhibitors     Angioedema  . Other Hives and Itching    msg    Social History   Socioeconomic History  . Marital status: Married    Spouse name: None  . Number of children: None  . Years of education: None  . Highest education level: None  Social Needs  . Financial resource strain: None  . Food insecurity - worry: None  . Food insecurity - inability: None  . Transportation needs - medical: None  . Transportation needs - non-medical: None  Occupational History  . Occupation: Runner, broadcasting/film/video  Tobacco Use  . Smoking status: Former Smoker    Packs/day: 1.00    Years: 13.00    Pack years: 13.00    Types: Cigarettes    Last attempt to quit: 07/13/1977    Years since quitting: 40.2  . Smokeless tobacco: Never Used  Substance and Sexual Activity  . Alcohol use: Yes    Alcohol/week: 0.0 oz    Comment: holidays only  . Drug use: No  . Sexual activity: No  Other Topics Concern  . None  Social History Narrative   Married for 44 years. He has 3 children and 5 grandchildren. They also 5 great grandchildren. He lives with his wife. 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 -> [employer] education   Occasional beer or wine   No smoking history or illicit drug use.   One hour water aerobics sessions 3 days a week. Has not started back yet read from recent illness sounds.    Objective: BP 106/76 (BP  Location: Left Arm, Patient Position: Sitting, Cuff Size: Large)   Pulse 67   Temp 98.1 F (36.7 C) (Oral)   Ht 6' (1.829 m)   Wt (!) 352 lb 12.8 oz (160 kg)   SpO2 95%   BMI 47.85 kg/m  Gen: NAD, resting comfortably HEENT: Mucous membranes are moist. Oropharynx normal CV: RRR no murmurs rubs or gallops Lungs: CTAB no crackles, wheeze, rhonchi Abdomen: soft/nontender/nondistended/normal bowel sounds. No rebound or guarding.  Ext: 2+ edema left leg vs. Trace on right- patient states chronic issue for years Skin: warm, dry Neuro: grossly normal, moves all extremities, PERRLA  Diabetic Foot Exam - Simple   Simple Foot  Form Diabetic Foot exam was performed with the following findings:  Yes 09/24/2017  3:07 PM  Visual Inspection See comments:  Yes Sensation Testing Intact to touch and monofilament testing bilaterally:  Yes Pulse Check Posterior Tibialis and Dorsalis pulse intact bilaterally:  Yes Comments Dry skin in multiple patches on both feet (advised lotion). Also Onychomycosis on several nails. Hammertoe without ulceration on left foot    Assessment/Plan:  67 y.o. male presenting for annual physical.  Health Maintenance counseling: 1. Anticipatory guidance: Patient counseled regarding regular dental exams -q6 months (cost has been a barrier since heart attack), eye exams - advised yearly, wearing seatbelts.  2. Risk factor reduction:  Advised patient of need for regular exercise and diet rich and fruits and vegetables to reduce risk of heart attack and stroke. Exercise- limited by knees and hips. Diet-has really tightened diet up- wonder if this was a falsely low reading still though.  Wt Readings from Last 3 Encounters:  09/24/17 (!) 352 lb 12.8 oz (160 kg)  07/28/17 (!) 389 lb (176.4 kg)  02/13/17 (!) 393 lb (178.3 kg)  3. Immunizations/screenings/ancillary studies- influenza shot today. shingrix availability discussed - can try at pharmacy.  Immunization History    Administered Date(s) Administered  . Influenza Split 09/15/2011, 07/15/2012  . Influenza Whole 06/25/1998, 05/31/2007, 07/17/2010  . Influenza,inj,Quad PF,6+ Mos 07/14/2013, 07/30/2015  . Td 08/26/1999, 12/08/2008   Health Maintenance Due  Topic Date Due  . FOOT EXAM - today  06/28/1959  . OPHTHALMOLOGY EXAM - advised of need of this 06/28/1959  . URINE MICROALBUMIN - today 06/28/1959  . COLONOSCOPY - due - he opts for cologuard 06/28/1999  . HEMOGLOBIN A1C - today  03/18/2017   4. Prostate cancer screening- opts in. Prior low risk trend  Lab Results  Component Value Date   PSA 1.23 12/26/2014   PSA 1.19 03/02/2013   PSA 0.54 09/08/2011   5. Colon cancer screening - cologuard today  Status of chronic or acute concerns   History of PE after prolonged travel- treated by Dr. Lovell Sheehan 6 months then stopped- thought provoked  Dyslipidemia- on atorvastatin 80mg  after nstemi. LDL goal under 70. Update lipids  HTN- controlled on metoprolol 50mg  BID, terazosin 10mg , lasix 20mg  MWF  BPH with urinary obstruction -get PSA today. He is on terazosin- honestly on exam- has normal sized prostate.   Multinodular goiter Noted CT 2014- when checking for PE. Update tsh  Diabetes mellitus type 2, controlled (HCC) S:  controlled. On no rx on last check. He no showed for diabetes education  Lab Results  Component Value Date   HGBA1C 6.7 (H) 09/18/2016   HGBA1C 6.4 07/30/2015   HGBA1C 6.7 (H) 12/26/2014   A/P: update a1c today - advised eye exam needed - check urine microalbumin/cr ratio- sadly have to avoid ace and ARb with angioedema history  Chronic combined systolic and diastolic heart failure (HCC) SYstolic and diastolic CHF- euvolemic. Great job with weight loss! Still has edema - Dr. Herbie Baltimore thinks edema is more related to weight and venous insufficiency than heart failure  Morbid obesity with BMI of 50.0-59.9, adult (HCC) Has been losing weight but wonder about error on measurement  today- will not change BMI until next visit. Advised continued healthier eating- exercise is hard for him as he depends on 2 canes for walking  CAD S/P percutaneous coronary angioplasty CAD- NSTEMI 08/2016. Now followed with [facility] cards after initial care by Dr. Algie Coffer who placed DES and placed him on asa, statin,  brilinta--> now on plavix and aspirin instead  Return in about 4 months (around 01/22/2018) for follow up- or sooner if needed.  Lab/Order associations: Preventative health care - Plan: CBC, Comprehensive metabolic panel, PSA, Lipid panel, Microalbumin / creatinine urine ratio, TSH, Hemoglobin A1c  CAD S/P percutaneous coronary angioplasty  Controlled type 2 diabetes mellitus without complication, without long-term current use of insulin (HCC) - Plan: CBC, Comprehensive metabolic panel, Lipid panel, Microalbumin / creatinine urine ratio, Hemoglobin A1c  BPH with urinary obstruction - Plan: PSA  Multinodular goiter - Plan: TSH  Return precautions advised.  Tana Conch, MD

## 2017-09-25 LAB — CBC
HEMATOCRIT: 41.3 % (ref 39.0–52.0)
Hemoglobin: 13.8 g/dL (ref 13.0–17.0)
MCHC: 33.4 g/dL (ref 30.0–36.0)
MCV: 91.4 fl (ref 78.0–100.0)
Platelets: 241 10*3/uL (ref 150.0–400.0)
RBC: 4.52 Mil/uL (ref 4.22–5.81)
RDW: 14 % (ref 11.5–15.5)
WBC: 7.5 10*3/uL (ref 4.0–10.5)

## 2017-09-25 LAB — MICROALBUMIN / CREATININE URINE RATIO
Creatinine,U: 155.9 mg/dL
MICROALB UR: 1.1 mg/dL (ref 0.0–1.9)
Microalb Creat Ratio: 0.7 mg/g (ref 0.0–30.0)

## 2017-09-25 LAB — COMPREHENSIVE METABOLIC PANEL
ALT: 18 U/L (ref 0–53)
AST: 19 U/L (ref 0–37)
Albumin: 3.7 g/dL (ref 3.5–5.2)
Alkaline Phosphatase: 98 U/L (ref 39–117)
BUN: 20 mg/dL (ref 6–23)
CHLORIDE: 103 meq/L (ref 96–112)
CO2: 29 mEq/L (ref 19–32)
CREATININE: 1.07 mg/dL (ref 0.40–1.50)
Calcium: 8.9 mg/dL (ref 8.4–10.5)
GFR: 88.32 mL/min (ref >60.00)
Glucose, Bld: 106 mg/dL — ABNORMAL HIGH (ref 70–99)
POTASSIUM: 4.5 meq/L (ref 3.5–5.1)
SODIUM: 141 meq/L (ref 135–145)
Total Bilirubin: 0.5 mg/dL (ref 0.2–1.2)
Total Protein: 7.4 g/dL (ref 6.0–8.3)

## 2017-09-25 LAB — LIPID PANEL
Cholesterol: 132 mg/dL (ref 0–200)
HDL: 43.3 mg/dL (ref >39.00)
LDL Cholesterol: 73 mg/dL (ref 0–99)
NONHDL: 88.83
Total CHOL/HDL Ratio: 3
Triglycerides: 78 mg/dL (ref 0.0–149.0)
VLDL: 15.6 mg/dL (ref 0.0–40.0)

## 2017-09-25 LAB — TSH: TSH: 1.37 u[IU]/mL (ref 0.35–4.50)

## 2017-09-25 LAB — PSA: PSA: 1.22 ng/mL (ref 0.10–4.00)

## 2017-09-25 LAB — HEMOGLOBIN A1C: Hgb A1c MFr Bld: 6.8 % — ABNORMAL HIGH (ref 4.6–6.5)

## 2017-10-27 ENCOUNTER — Other Ambulatory Visit: Payer: Self-pay | Admitting: Cardiology

## 2017-12-13 ENCOUNTER — Other Ambulatory Visit: Payer: Self-pay

## 2017-12-13 ENCOUNTER — Other Ambulatory Visit (HOSPITAL_COMMUNITY): Payer: Medicare Other

## 2017-12-13 ENCOUNTER — Inpatient Hospital Stay (HOSPITAL_COMMUNITY): Payer: Medicare Other

## 2017-12-13 ENCOUNTER — Inpatient Hospital Stay (HOSPITAL_COMMUNITY)
Admission: EM | Admit: 2017-12-13 | Discharge: 2017-12-17 | DRG: 052 | Disposition: A | Discharge status: Rehabilitation Facility | Payer: Medicare Other | Attending: Family Medicine | Admitting: Family Medicine

## 2017-12-13 ENCOUNTER — Emergency Department (HOSPITAL_COMMUNITY): Payer: Medicare Other

## 2017-12-13 ENCOUNTER — Encounter (HOSPITAL_COMMUNITY): Payer: Self-pay | Admitting: Emergency Medicine

## 2017-12-13 DIAGNOSIS — E1169 Type 2 diabetes mellitus with other specified complication: Secondary | ICD-10-CM | POA: Diagnosis present

## 2017-12-13 DIAGNOSIS — G8250 Quadriplegia, unspecified: Secondary | ICD-10-CM | POA: Diagnosis not present

## 2017-12-13 DIAGNOSIS — M1990 Unspecified osteoarthritis, unspecified site: Secondary | ICD-10-CM | POA: Diagnosis present

## 2017-12-13 DIAGNOSIS — E119 Type 2 diabetes mellitus without complications: Secondary | ICD-10-CM

## 2017-12-13 DIAGNOSIS — Z6841 Body Mass Index (BMI) 40.0 and over, adult: Secondary | ICD-10-CM

## 2017-12-13 DIAGNOSIS — S14106A Unspecified injury at C6 level of cervical spinal cord, initial encounter: Secondary | ICD-10-CM | POA: Diagnosis not present

## 2017-12-13 DIAGNOSIS — I5042 Chronic combined systolic (congestive) and diastolic (congestive) heart failure: Secondary | ICD-10-CM | POA: Diagnosis present

## 2017-12-13 DIAGNOSIS — Z87891 Personal history of nicotine dependence: Secondary | ICD-10-CM

## 2017-12-13 DIAGNOSIS — I2510 Atherosclerotic heart disease of native coronary artery without angina pectoris: Secondary | ICD-10-CM

## 2017-12-13 DIAGNOSIS — Z888 Allergy status to other drugs, medicaments and biological substances status: Secondary | ICD-10-CM

## 2017-12-13 DIAGNOSIS — R937 Abnormal findings on diagnostic imaging of other parts of musculoskeletal system: Secondary | ICD-10-CM | POA: Diagnosis not present

## 2017-12-13 DIAGNOSIS — W182XXA Fall in (into) shower or empty bathtub, initial encounter: Secondary | ICD-10-CM | POA: Diagnosis present

## 2017-12-13 DIAGNOSIS — Z7902 Long term (current) use of antithrombotics/antiplatelets: Secondary | ICD-10-CM | POA: Diagnosis not present

## 2017-12-13 DIAGNOSIS — S0083XA Contusion of other part of head, initial encounter: Secondary | ICD-10-CM | POA: Diagnosis present

## 2017-12-13 DIAGNOSIS — W19XXXA Unspecified fall, initial encounter: Secondary | ICD-10-CM | POA: Diagnosis present

## 2017-12-13 DIAGNOSIS — I252 Old myocardial infarction: Secondary | ICD-10-CM | POA: Diagnosis not present

## 2017-12-13 DIAGNOSIS — M542 Cervicalgia: Secondary | ICD-10-CM | POA: Diagnosis not present

## 2017-12-13 DIAGNOSIS — N138 Other obstructive and reflux uropathy: Secondary | ICD-10-CM | POA: Diagnosis present

## 2017-12-13 DIAGNOSIS — S14154D Other incomplete lesion at C4 level of cervical spinal cord, subsequent encounter: Secondary | ICD-10-CM | POA: Diagnosis not present

## 2017-12-13 DIAGNOSIS — Y92002 Bathroom of unspecified non-institutional (private) residence single-family (private) house as the place of occurrence of the external cause: Secondary | ICD-10-CM | POA: Diagnosis not present

## 2017-12-13 DIAGNOSIS — M4802 Spinal stenosis, cervical region: Secondary | ICD-10-CM | POA: Diagnosis present

## 2017-12-13 DIAGNOSIS — Z86711 Personal history of pulmonary embolism: Secondary | ICD-10-CM | POA: Diagnosis not present

## 2017-12-13 DIAGNOSIS — Z955 Presence of coronary angioplasty implant and graft: Secondary | ICD-10-CM

## 2017-12-13 DIAGNOSIS — H9190 Unspecified hearing loss, unspecified ear: Secondary | ICD-10-CM | POA: Diagnosis present

## 2017-12-13 DIAGNOSIS — I2699 Other pulmonary embolism without acute cor pulmonale: Secondary | ICD-10-CM | POA: Diagnosis present

## 2017-12-13 DIAGNOSIS — S14104S Unspecified injury at C4 level of cervical spinal cord, sequela: Secondary | ICD-10-CM | POA: Diagnosis not present

## 2017-12-13 DIAGNOSIS — I714 Abdominal aortic aneurysm, without rupture: Secondary | ICD-10-CM | POA: Diagnosis present

## 2017-12-13 DIAGNOSIS — S14104A Unspecified injury at C4 level of cervical spinal cord, initial encounter: Secondary | ICD-10-CM | POA: Diagnosis present

## 2017-12-13 DIAGNOSIS — Y93E1 Activity, personal bathing and showering: Secondary | ICD-10-CM | POA: Diagnosis not present

## 2017-12-13 DIAGNOSIS — E785 Hyperlipidemia, unspecified: Secondary | ICD-10-CM | POA: Diagnosis present

## 2017-12-13 DIAGNOSIS — I110 Hypertensive heart disease with heart failure: Secondary | ICD-10-CM | POA: Diagnosis present

## 2017-12-13 DIAGNOSIS — M79672 Pain in left foot: Secondary | ICD-10-CM | POA: Diagnosis not present

## 2017-12-13 DIAGNOSIS — E669 Obesity, unspecified: Secondary | ICD-10-CM | POA: Diagnosis not present

## 2017-12-13 DIAGNOSIS — R202 Paresthesia of skin: Secondary | ICD-10-CM

## 2017-12-13 DIAGNOSIS — M109 Gout, unspecified: Secondary | ICD-10-CM | POA: Diagnosis present

## 2017-12-13 DIAGNOSIS — Z79899 Other long term (current) drug therapy: Secondary | ICD-10-CM

## 2017-12-13 DIAGNOSIS — N401 Enlarged prostate with lower urinary tract symptoms: Secondary | ICD-10-CM | POA: Diagnosis present

## 2017-12-13 DIAGNOSIS — M50323 Other cervical disc degeneration at C6-C7 level: Secondary | ICD-10-CM | POA: Diagnosis present

## 2017-12-13 DIAGNOSIS — R29898 Other symptoms and signs involving the musculoskeletal system: Secondary | ICD-10-CM

## 2017-12-13 DIAGNOSIS — S14106S Unspecified injury at C6 level of cervical spinal cord, sequela: Secondary | ICD-10-CM | POA: Diagnosis not present

## 2017-12-13 DIAGNOSIS — S14129S Central cord syndrome at unspecified level of cervical spinal cord, sequela: Secondary | ICD-10-CM | POA: Diagnosis not present

## 2017-12-13 DIAGNOSIS — G9520 Unspecified cord compression: Secondary | ICD-10-CM | POA: Diagnosis not present

## 2017-12-13 DIAGNOSIS — I10 Essential (primary) hypertension: Secondary | ICD-10-CM | POA: Diagnosis not present

## 2017-12-13 DIAGNOSIS — I719 Aortic aneurysm of unspecified site, without rupture: Secondary | ICD-10-CM | POA: Diagnosis present

## 2017-12-13 DIAGNOSIS — Z9861 Coronary angioplasty status: Secondary | ICD-10-CM

## 2017-12-13 HISTORY — DX: Non-ST elevation (NSTEMI) myocardial infarction: I21.4

## 2017-12-13 HISTORY — DX: Unspecified osteoarthritis, unspecified site: M19.90

## 2017-12-13 HISTORY — DX: Type 2 diabetes mellitus without complications: E11.9

## 2017-12-13 HISTORY — DX: Pure hypercholesterolemia, unspecified: E78.00

## 2017-12-13 LAB — COMPREHENSIVE METABOLIC PANEL
ALBUMIN: 3.3 g/dL — AB (ref 3.5–5.0)
ALK PHOS: 89 U/L (ref 38–126)
ALT: 22 U/L (ref 17–63)
ANION GAP: 12 (ref 5–15)
AST: 31 U/L (ref 15–41)
BUN: 23 mg/dL — ABNORMAL HIGH (ref 6–20)
CALCIUM: 8.6 mg/dL — AB (ref 8.9–10.3)
CO2: 21 mmol/L — ABNORMAL LOW (ref 22–32)
CREATININE: 1.17 mg/dL (ref 0.61–1.24)
Chloride: 104 mmol/L (ref 101–111)
GFR calc Af Amer: >60 mL/min (ref >60)
GFR calc non Af Amer: >60 mL/min (ref >60)
GLUCOSE: 173 mg/dL — AB (ref 65–99)
Potassium: 3.9 mmol/L (ref 3.5–5.1)
SODIUM: 137 mmol/L (ref 135–145)
Total Bilirubin: 0.8 mg/dL (ref 0.3–1.2)
Total Protein: 7.3 g/dL (ref 6.5–8.1)

## 2017-12-13 LAB — URINALYSIS, ROUTINE W REFLEX MICROSCOPIC
Bilirubin Urine: NEGATIVE
Glucose, UA: NEGATIVE mg/dL
KETONES UR: NEGATIVE mg/dL
Leukocytes, UA: NEGATIVE
Nitrite: NEGATIVE
PH: 5 (ref 5.0–8.0)
PROTEIN: NEGATIVE mg/dL
Specific Gravity, Urine: 1.021 (ref 1.005–1.030)

## 2017-12-13 LAB — PROTIME-INR
INR: 1.08
PROTHROMBIN TIME: 13.9 s (ref 11.4–15.2)

## 2017-12-13 LAB — CBC
HCT: 42.9 % (ref 39.0–52.0)
HEMOGLOBIN: 13.9 g/dL (ref 13.0–17.0)
MCH: 29.7 pg (ref 26.0–34.0)
MCHC: 32.4 g/dL (ref 30.0–36.0)
MCV: 91.7 fL (ref 78.0–100.0)
Platelets: 173 10*3/uL (ref 150–400)
RBC: 4.68 MIL/uL (ref 4.22–5.81)
RDW: 14.7 % (ref 11.5–15.5)
WBC: 10.1 10*3/uL (ref 4.0–10.5)

## 2017-12-13 LAB — SAMPLE TO BLOOD BANK

## 2017-12-13 LAB — HEMOGLOBIN A1C
Hgb A1c MFr Bld: 6.5 % — ABNORMAL HIGH (ref 4.8–5.6)
Mean Plasma Glucose: 139.85 mg/dL

## 2017-12-13 LAB — I-STAT CHEM 8, ED
BUN: 31 mg/dL — ABNORMAL HIGH (ref 6–20)
CHLORIDE: 104 mmol/L (ref 101–111)
Calcium, Ion: 1.05 mmol/L — ABNORMAL LOW (ref 1.15–1.40)
Creatinine, Ser: 1.1 mg/dL (ref 0.61–1.24)
Glucose, Bld: 175 mg/dL — ABNORMAL HIGH (ref 65–99)
HEMATOCRIT: 44 % (ref 39.0–52.0)
Hemoglobin: 15 g/dL (ref 13.0–17.0)
POTASSIUM: 4.3 mmol/L (ref 3.5–5.1)
SODIUM: 140 mmol/L (ref 135–145)
TCO2: 26 mmol/L (ref 22–32)

## 2017-12-13 LAB — GLUCOSE, CAPILLARY
Glucose-Capillary: 125 mg/dL — ABNORMAL HIGH (ref 65–99)
Glucose-Capillary: 190 mg/dL — ABNORMAL HIGH (ref 65–99)

## 2017-12-13 LAB — CDS SEROLOGY

## 2017-12-13 LAB — I-STAT CG4 LACTIC ACID, ED: LACTIC ACID, VENOUS: 1.76 mmol/L (ref 0.5–1.9)

## 2017-12-13 LAB — MRSA PCR SCREENING: MRSA by PCR: NEGATIVE

## 2017-12-13 LAB — ETHANOL

## 2017-12-13 MED ORDER — INSULIN ASPART 100 UNIT/ML ~~LOC~~ SOLN
0.0000 [IU] | Freq: Three times a day (TID) | SUBCUTANEOUS | Status: DC
  Administered 2017-12-14: 2 [IU] via SUBCUTANEOUS

## 2017-12-13 MED ORDER — SODIUM CHLORIDE 0.9 % IV SOLN
INTRAVENOUS | Status: DC
Start: 2017-12-13 — End: 2017-12-14
  Administered 2017-12-13: 14:00:00 via INTRAVENOUS

## 2017-12-13 MED ORDER — TERAZOSIN HCL 5 MG PO CAPS
10.0000 mg | ORAL_CAPSULE | Freq: Every day | ORAL | Status: DC
  Administered 2017-12-13 – 2017-12-16 (×4): 10 mg via ORAL
  Filled 2017-12-13 (×4): qty 2

## 2017-12-13 MED ORDER — ACETAMINOPHEN 325 MG PO TABS: 650.0000 mg | ORAL_TABLET | Freq: Four times a day (QID) | ORAL | Status: DC | PRN

## 2017-12-13 MED ORDER — DOCUSATE SODIUM 100 MG PO CAPS
100.0000 mg | ORAL_CAPSULE | Freq: Two times a day (BID) | ORAL | Status: DC
Start: 2017-12-14 — End: 2017-12-17
  Administered 2017-12-14 – 2017-12-17 (×3): 100 mg via ORAL
  Filled 2017-12-13 (×7): qty 1

## 2017-12-13 MED ORDER — ATORVASTATIN CALCIUM 80 MG PO TABS
80.0000 mg | ORAL_TABLET | Freq: Every day | ORAL | Status: DC
  Administered 2017-12-14 – 2017-12-17 (×4): 80 mg via ORAL
  Filled 2017-12-13 (×4): qty 1

## 2017-12-13 MED ORDER — ONDANSETRON HCL 4 MG/2ML IJ SOLN: 4.0000 mg | Freq: Four times a day (QID) | INTRAMUSCULAR | Status: DC | PRN

## 2017-12-13 MED ORDER — DEXAMETHASONE SODIUM PHOSPHATE 4 MG/ML IJ SOLN
4.0000 mg | Freq: Three times a day (TID) | INTRAMUSCULAR | Status: AC
  Administered 2017-12-13 – 2017-12-15 (×5): 4 mg via INTRAVENOUS
  Filled 2017-12-13 (×5): qty 1

## 2017-12-13 MED ORDER — FENTANYL CITRATE (PF) 100 MCG/2ML IJ SOLN
25.0000 ug | INTRAMUSCULAR | Status: DC | PRN
Start: 2017-12-13 — End: 2017-12-17
  Administered 2017-12-13: 25 ug via INTRAVENOUS
  Filled 2017-12-13: qty 2

## 2017-12-13 MED ORDER — FENTANYL CITRATE (PF) 100 MCG/2ML IJ SOLN
50.0000 ug | Freq: Once | INTRAMUSCULAR | Status: AC
  Administered 2017-12-13: 50 ug via INTRAVENOUS
  Filled 2017-12-13: qty 2

## 2017-12-13 MED ORDER — METOPROLOL TARTRATE 5 MG/5ML IV SOLN
5.0000 mg | Freq: Three times a day (TID) | INTRAVENOUS | Status: DC
  Administered 2017-12-13: 5 mg via INTRAVENOUS
  Filled 2017-12-13: qty 5

## 2017-12-13 MED ORDER — DEXAMETHASONE SODIUM PHOSPHATE 10 MG/ML IJ SOLN
10.0000 mg | Freq: Once | INTRAMUSCULAR | Status: AC
  Administered 2017-12-13: 10 mg via INTRAVENOUS
  Filled 2017-12-13: qty 1

## 2017-12-13 MED ORDER — ATORVASTATIN CALCIUM 80 MG PO TABS: 80.0000 mg | ORAL_TABLET | Freq: Every day | ORAL | Status: DC

## 2017-12-13 MED ORDER — FENTANYL CITRATE (PF) 100 MCG/2ML IJ SOLN
50.0000 ug | Freq: Once | INTRAMUSCULAR | Status: AC
Start: 2017-12-13 — End: 2017-12-13
  Administered 2017-12-13: 50 ug via INTRAVENOUS
  Filled 2017-12-13: qty 2

## 2017-12-13 MED ORDER — INSULIN ASPART 100 UNIT/ML ~~LOC~~ SOLN: 0.0000 [IU] | Freq: Every day | SUBCUTANEOUS | Status: DC

## 2017-12-13 MED ORDER — SODIUM CHLORIDE 0.9 % IV SOLN
Freq: Once | INTRAVENOUS | Status: AC
  Administered 2017-12-13: 11:00:00 via INTRAVENOUS

## 2017-12-13 MED ORDER — HYDROCODONE-ACETAMINOPHEN 5-325 MG PO TABS
1.0000 | ORAL_TABLET | Freq: Four times a day (QID) | ORAL | Status: DC | PRN
  Administered 2017-12-14: 1 via ORAL
  Filled 2017-12-13: qty 1

## 2017-12-13 MED ORDER — LABETALOL HCL 5 MG/ML IV SOLN
10.0000 mg | Freq: Once | INTRAVENOUS | Status: AC
  Administered 2017-12-13: 10 mg via INTRAVENOUS
  Filled 2017-12-13: qty 4

## 2017-12-13 MED ORDER — METOPROLOL TARTRATE 50 MG PO TABS
50.0000 mg | ORAL_TABLET | Freq: Two times a day (BID) | ORAL | Status: DC
  Administered 2017-12-13 – 2017-12-17 (×8): 50 mg via ORAL
  Filled 2017-12-13 (×8): qty 1

## 2017-12-13 MED ORDER — DOCUSATE SODIUM 100 MG PO CAPS: 100.0000 mg | ORAL_CAPSULE | Freq: Two times a day (BID) | ORAL | Status: DC

## 2017-12-13 MED ORDER — ONDANSETRON HCL 4 MG PO TABS: 4.0000 mg | ORAL_TABLET | Freq: Four times a day (QID) | ORAL | Status: DC | PRN

## 2017-12-13 MED ORDER — ACETAMINOPHEN 650 MG RE SUPP: 650.0000 mg | Freq: Four times a day (QID) | RECTAL | Status: DC | PRN

## 2017-12-13 MED ORDER — CLOPIDOGREL BISULFATE 75 MG PO TABS: 75.0000 mg | ORAL_TABLET | Freq: Every day | ORAL | Status: DC

## 2017-12-13 MED ORDER — HYDRALAZINE HCL 20 MG/ML IJ SOLN: 10.0000 mg | Freq: Four times a day (QID) | INTRAMUSCULAR | Status: DC | PRN

## 2017-12-13 NOTE — H&P (Signed)
History and Physical    Franklin Hicks ZOX:[id number] DOB: [birth year] DOA: 12/13/2017  PCP: Shelva Majestic, MD Patient coming from: home  Chief Complaint: fall  HPI: Franklin Hicks is a 67 y.o. male with medical history significant obesity, CAD status post percutaneous coronary angioplasty 2018, chronic combined systolic diastolic heart failure, diabetes, history of non-ST elevation myocardial infarction 2018 presents to the emergency department after a fall in the bathtub. Initial evaluation reveals neurological deficits to bilateral upper extremities concerning for cord compression. [facility] hospitalists are asked to admit.  Information is obtained from the patient and the chart.e reports he was in his usual state of health until this morning when he "slipped on wet bathtub and fell forward". He states he had his head on the side of the tub but he did not lose consciousness. He reports that immediately he felt pain in his neck and an inability to move his extremities. Will relieve his neck remained in the extension position for almost 20 minutes. He denies any headache dizziness syncope or near-syncope. He denies any chest pain palpitation shortness of breath. He denies abdominal pain nausea vomiting. He denies any dysuria hematuria frequency or urgency. The time of admission he is able to move bilateral lower extremities Including toes and knees fLex hips particularly on the right. Upper extremities with gross motor movement at shoulder level mostly some elbow. Also reports "pins and needles from shoulders down to hands.   ED Course: the emergency department he's afebrile hemodynamically stable and not hypoxic.  Review of Systems: As per HPI otherwise all other systems reviewed and are negative.   Ambulatory Status: ambulates independently is independent with ADLs  Past Medical History:  Diagnosis Date  . CAD S/P percutaneous coronary angioplasty 09/20/2016   Nstemi 08/2016. Dr. Algie Coffer. DES-  brilinta and asa. Requests change to [facility] cardiology; 95% Ramus -> PCI Resolute Onyx DES 2.75 x 18  . Chronic combined systolic and diastolic heart failure (HCC) 10/09/2016   EF 45% and grade II diastolic after nstemi  . Diabetes mellitus without complication (HCC)   . DJD (degenerative joint disease)   . Gout   . H/O non-ST elevation myocardial infarction (NSTEMI) 09/20/2016   95% Ramus - > PCI   . Hypertension   . Obesity     Past Surgical History:  Procedure Laterality Date  . CARDIAC CATHETERIZATION N/A 09/19/2016   Procedure: Left Heart Cath and Coronary Angiography;  Surgeon: Orpah Cobb, MD;  Location: [facility] INVASIVE CV LAB;  Service: Cardiovascular: 95% proximal Ramus Intermedius --> PCI  . CARDIAC CATHETERIZATION N/A 09/19/2016   Procedure: Coronary Stent Intervention;  Surgeon: Yvonne Kendall, MD;  Location: [facility] INVASIVE CV LAB;  Service: Cardiovascular: 95% ramus intermedius;  Resolute Onyx 2.75 x 18 mm drug-eluting stent  . left knee open meniscetomy    . TRANSTHORACIC ECHOCARDIOGRAM  2004   no lvh nl ejection fraction  . TRANSTHORACIC ECHOCARDIOGRAM  09/19/2016   In setting of NSTEMI:  EF 45-50% with diffuse hypokinesis. GR 2 DD. Mild biatrial enlargement.  . ureteral stone  8119,1478    Social History   Socioeconomic History  . Marital status: Married    Spouse name: Not on file  . Number of children: Not on file  . Years of education: Not on file  . Highest education level: Not on file  Occupational History  . Occupation: Runner, broadcasting/film/video  Social Needs  . Financial resource strain: Not on file  . Food insecurity:    Worry: Not  on file    Inability: Not on file  . Transportation needs:    Medical: Not on file    Non-medical: Not on file  Tobacco Use  . Smoking status: Former Smoker    Packs/day: 1.00    Years: 13.00    Pack years: 13.00    Types: Cigarettes    Last attempt to quit: 07/13/1977    Years since quitting: 40.4  . Smokeless tobacco: Never Used    Substance and Sexual Activity  . Alcohol use: Yes    Alcohol/week: 0.0 oz    Comment: holidays only  . Drug use: No  . Sexual activity: Never  Lifestyle  . Physical activity:    Days per week: Not on file    Minutes per session: Not on file  . Stress: Not on file  Relationships  . Social connections:    Talks on phone: Not on file    Gets together: Not on file    Attends religious service: Not on file    Active member of club or organization: Not on file    Attends meetings of clubs or organizations: Not on file    Relationship status: Not on file  . Intimate partner violence:    Fear of current or ex partner: Not on file    Emotionally abused: Not on file    Physically abused: Not on file    Forced sexual activity: Not on file  Other Topics Concern  . Not on file  Social History Narrative   Married for 44 years. He has 3 children and 5 grandchildren. They also 5 great grandchildren. He lives with his wife. 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 -> [employer] education   Occasional beer or wine   No smoking history or illicit drug use.   One hour water aerobics sessions 3 days a week. Has not started back yet read from recent illness sounds.    Allergies  Allergen Reactions  . Ace Inhibitors     Angioedema  . Other Hives and Itching    msg    Family History  Problem Relation Age of Onset  . Liver disease Mother     Prior to Admission medications   Medication Sig Start Date End Date Taking? Authorizing Provider  atorvastatin (LIPITOR) 80 MG tablet TAKE 1 TABLET (80 MG TOTAL) BY MOUTH DAILY AT 6 PM. 10/28/17  Yes Marykay Lex, MD  Chromium 200 MCG CAPS Take 200 mcg by mouth daily.    Yes [provider]  CINNAMON PO Take 1 capsule by mouth daily.   Yes [provider]  clopidogrel (PLAVIX) 75 MG tablet Take 1 tablet (75 mg total) by mouth daily. 07/28/17  Yes Marykay Lex, MD  furosemide (LASIX) 20 MG tablet Take 1 tablet  (20 mg total) by mouth every Monday, Wednesday, and Friday. 07/20/17  Yes Marykay Lex, MD  Krill Oil 1000 MG CAPS Take 1,000 mg by mouth daily.    Yes [provider]  metoprolol tartrate (LOPRESSOR) 50 MG tablet Take 1 tablet (50 mg total) by mouth 2 (two) times daily. 07/20/17  Yes Marykay Lex, MD  Multiple Vitamin (MULTIVITAMIN WITH MINERALS) TABS tablet Take 1 tablet by mouth daily.   Yes [provider]  Potassium Citrate 15 MEQ (1620 MG) TBCR Take 1 tablet by mouth 2 (two) times daily. 08/10/17  Yes [provider]  terazosin (HYTRIN) 10 MG capsule TAKE 1 CAPSULE (10 MG  TOTAL) BY MOUTH AT BEDTIME. Patient taking differently: Take 10 mg by mouth at bedtime. TAKE 1 CAPSULE (10 MG TOTAL) BY MOUTH AT BEDTIME. 09/01/17  Yes Shelva Majestic, MD  vitamin B-12 (CYANOCOBALAMIN) 100 MCG tablet Take 100 mcg by mouth daily.   Yes [provider]    Physical Exam: Vitals:   12/13/17 1245 12/13/17 1300 12/13/17 1315 12/13/17 1330  BP: (!) 152/77 139/79 (!) 158/81 (!) 157/77  Pulse: 85 83 82 84  Resp: (!) 21 15 13 19   Temp:      TempSrc:      SpO2: 95%  96% 91%  Weight:      Height:         General:  Appears calm lying flat cervical collar intact. Large contusion forhead Eyes:  PERRL, EOMI, lateral lives with swelling erythema ENT:  grossly normal hearing, lips & tongue, mmm Neck:  no LAD, masses or thyromegaly Cardiovascular:  RRR, no m/r/g. No LE edema.  Respiratory:  CTA bilaterally, no w/r/r. Normal respiratory effort. Abdomen:  soft, ntnd, obese positive bowel sounds throughout no guarding or rebounding Skin:  no rash or induration seen on limited exam Musculoskeletal:  grossly normal tone BUE/BLE, good ROM, no bony abnormality Psychiatric:  grossly normal mood and affect, speech fluent and appropriate, AOx3 Neurologic:  CN 2-12 grossly intact, moves all extremities in coordinated fashion, sensation intact speech clear facial symmetry  decreased bilateral upper extremity strength particularly with grip decreased sensation from shoulder to hands bilaterally to flex hips and knees bilaterally with greater range of motion on right than left. Sensation intact lower extremities bilaterally Labs on Admission: I have personally reviewed following labs and imaging studies  CBC: Recent Labs  Lab 12/13/17 1050 12/13/17 1056  WBC 10.1  --   HGB 13.9 15.0  HCT 42.9 44.0  MCV 91.7  --   PLT 173  --    Basic Metabolic Panel: Recent Labs  Lab 12/13/17 1050 12/13/17 1056  NA 137 140  K 3.9 4.3  CL 104 104  CO2 21*  --   GLUCOSE 173* 175*  BUN 23* 31*  CREATININE 1.17 1.10  CALCIUM 8.6*  --    GFR: Estimated Creatinine Clearance: 104.2 mL/min (by C-G formula based on SCr of 1.1 mg/dL). Liver Function Tests: Recent Labs  Lab 12/13/17 1050  AST 31  ALT 22  ALKPHOS 89  BILITOT 0.8  PROT 7.3  ALBUMIN 3.3*   No results for input(s): LIPASE, AMYLASE in the last 168 hours. No results for input(s): AMMONIA in the last 168 hours. Coagulation Profile: Recent Labs  Lab 12/13/17 1050  INR 1.08   Cardiac Enzymes: No results for input(s): CKTOTAL, CKMB, CKMBINDEX, TROPONINI in the last 168 hours. BNP (last 3 results) No results for input(s): PROBNP in the last 8760 hours. HbA1C: No results for input(s): HGBA1C in the last 72 hours. CBG: No results for input(s): GLUCAP in the last 168 hours. Lipid Profile: No results for input(s): CHOL, HDL, LDLCALC, TRIG, CHOLHDL, LDLDIRECT in the last 72 hours. Thyroid Function Tests: No results for input(s): TSH, T4TOTAL, FREET4, T3FREE, THYROIDAB in the last 72 hours. Anemia Panel: No results for input(s): VITAMINB12, FOLATE, FERRITIN, TIBC, IRON, RETICCTPCT in the last 72 hours. Urine analysis:    Component Value Date/Time   COLORURINE YELLOW 07/13/2013 0531   APPEARANCEUR CLOUDY (A) 07/13/2013 0531   LABSPEC 1.030 07/13/2013 0531   PHURINE 5.5 07/13/2013 0531   GLUCOSEU  NEGATIVE 07/13/2013 0531   GLUCOSEU  NEGATIVE 12/01/2008 1034   HGBUR TRACE (A) 07/13/2013 0531   HGBUR trace-lysed 07/10/2010 0912   BILIRUBINUR N 01/12/2017 1648   KETONESUR NEGATIVE 07/13/2013 0531   PROTEINUR N 01/12/2017 1648   PROTEINUR NEGATIVE 07/13/2013 0531   UROBILINOGEN 0.2 01/12/2017 1648   UROBILINOGEN 0.2 07/13/2013 0531   NITRITE N 01/12/2017 1648   NITRITE NEGATIVE 07/13/2013 0531   LEUKOCYTESUR Negative 01/12/2017 1648    Creatinine Clearance: Estimated Creatinine Clearance: 104.2 mL/min (by C-G formula based on SCr of 1.1 mg/dL).  Sepsis Labs: @LABRCNTIP (procalcitonin:4,lacticidven:4) )No results found for this or any previous visit (from the past 240 hour(s)).   Radiological Exams on Admission: Ct Head Wo Contrast  Result Date: 12/13/2017 CLINICAL DATA:  Status post fall in the bathtub today with a blow to the head. Neck pain. Initial encounter. EXAM: CT HEAD WITHOUT CONTRAST CT CERVICAL SPINE WITHOUT CONTRAST TECHNIQUE: Multidetector CT imaging of the head and cervical spine was performed following the standard protocol without intravenous contrast. Multiplanar CT image reconstructions of the cervical spine were also generated. COMPARISON:  None. FINDINGS: CT HEAD FINDINGS Brain: No evidence of acute infarction, hemorrhage, hydrocephalus, extra-axial collection or mass lesion/mass effect. Vascular: No hyperdense vessel or unexpected calcification. Skull: Intact. Sinuses/Orbits: Clear. Other: Soft tissue contusion anterior forehead noted. CT CERVICAL SPINE FINDINGS Alignment: Maintained.  Straightening of lordosis noted. Skull base and vertebrae: No acute fracture. No primary bone lesion or focal pathologic process. Soft tissues and spinal canal: No prevertebral fluid or swelling. No visible canal hematoma. Disc levels: Loss of disc space height and endplate spurring are seen at C5-6, C6-7 and to a lesser degree, C7-T1. Upper chest: Lung apices clear. Other: None.  IMPRESSION: Soft tissue contusion anterior to the frontal bone without underlying fracture. No acute intracranial abnormality. No acute abnormality cervical spine. Degenerative disc disease most notable at C5-6 and C6-7. Electronically Signed   By: Drusilla Kanner M.D.   On: 12/13/2017 12:35   Ct Cervical Spine Wo Contrast  Result Date: 12/13/2017 CLINICAL DATA:  Status post fall in the bathtub today with a blow to the head. Neck pain. Initial encounter. EXAM: CT HEAD WITHOUT CONTRAST CT CERVICAL SPINE WITHOUT CONTRAST TECHNIQUE: Multidetector CT imaging of the head and cervical spine was performed following the standard protocol without intravenous contrast. Multiplanar CT image reconstructions of the cervical spine were also generated. COMPARISON:  None. FINDINGS: CT HEAD FINDINGS Brain: No evidence of acute infarction, hemorrhage, hydrocephalus, extra-axial collection or mass lesion/mass effect. Vascular: No hyperdense vessel or unexpected calcification. Skull: Intact. Sinuses/Orbits: Clear. Other: Soft tissue contusion anterior forehead noted. CT CERVICAL SPINE FINDINGS Alignment: Maintained.  Straightening of lordosis noted. Skull base and vertebrae: No acute fracture. No primary bone lesion or focal pathologic process. Soft tissues and spinal canal: No prevertebral fluid or swelling. No visible canal hematoma. Disc levels: Loss of disc space height and endplate spurring are seen at C5-6, C6-7 and to a lesser degree, C7-T1. Upper chest: Lung apices clear. Other: None. IMPRESSION: Soft tissue contusion anterior to the frontal bone without underlying fracture. No acute intracranial abnormality. No acute abnormality cervical spine. Degenerative disc disease most notable at C5-6 and C6-7. Electronically Signed   By: Drusilla Kanner M.D.   On: 12/13/2017 12:35    EKG: pending at time of admission  Assessment/Plan Principal Problem:   Cord compression [facility]) Active Problems:   BPH with urinary  obstruction   Diabetes mellitus type 2, controlled (HCC)   Dyslipidemia, goal LDL below 70   Morbid  obesity with BMI of 50.0-59.9, adult (HCC)   CAD S/P percutaneous coronary angioplasty   Chronic combined systolic and diastolic heart failure (HCC)   Fall   Abnormal CT scan, cervical spine   #1. Fall/abnormal CT scan of cervical spine related to a mechanical fall in the bathtub this morning. CT reveals Soft tissue contusion anterior to the frontal bone without underlying fracture. No acute intracranial abnormality. Neuro exam with upper extremity deficits and decreased sensation. eD provider discussed with Dr. Venetia MaxonStern of neurosurgery who recommended admission to stepdown -admit to step down -MRI per neurosurgery -hold plavix -gentle IV fluids -Nothing by mouth for now -dexamethasone per neurosurgery -Frequent neuro checks -Continue cervical collar -For further management to neurosurgery  #2. Chronic combined systolic and diastolic heart failure. Compensated. Echo reveals an EF of 45% and grade 2 diastolic dysfunction February [id number]. Home medications include Lasix,metoprolol. -Holding oral medications for now -We'll provide when necessary metoprolol with parameters -Monitor intake and output -Obtain daily weights  #3. CAD status post angioplasty. No chest pain. EKG pending at the time of admission. Home medications include Plavix and a statin -Holding Plavix for now -continue statin  #4. Diabetes. Diet controlled. Serum glucose 175 on admission. I suspect this will trend upward given need for dexamethasone -Obtain a hemoglobin A1c -Sliding scale insulin for optimal control -Carb modified diet when he is eating  #5. Morbid obesity. BMI 50.8 -Nutritional consult   DVT prophylaxis: scd Code Status: full  Family Communication: wife at bedside  Disposition Plan: home [facility]  Consults called: dr Venetia Maxonstern neurosurgery  Admission status: inpatient    Gwenyth BenderBLACK,Tashawn Greff M MD [facility]  If 7PM-7AM, please contact night-coverage [email or web address] Password [email or web address]  12/13/2017, 2:12 PM

## 2017-12-13 NOTE — Progress Notes (Signed)
Spoke with ED RN at 1310. RN stated okay to send for pt in 15 minutes as she needed to get pt some meds before MRI. Sent for patient at 1325. At 1402 transport called RN took pt to floor. MRI delayed.

## 2017-12-13 NOTE — Progress Notes (Signed)
1900: Handoff report received from RN. Pt resting in bed with multiple visitors; advised of visitor policy (max 2 at a time, 1 overnight guest). Discussed plan of care for the shift; pt amenable to plan. Pt c/o left sided abdominal pain with deep breathing and numbness/tingling in BL wrists; denies other pain, HA.  2000: Skin assessment reveals very moist and broken skin in folds of pannus; pt denies knowledge of the condition PTA. Site cleansed and dried; powder applied.  0000: Pt resting comfortably.  0300: Reassessment of skin folds reveals moisture which the powder failed to control and further breakdown of skin. Site cleansed and dried; powder reapplied. Pt asking to apply topical shea butter from home; will discuss with treatment team in the morning.  0400: Pt continues resting comfortably.  0700: Handoff report given to RN. No acute events overnight.

## 2017-12-13 NOTE — ED Triage Notes (Signed)
Pt arrives via EMS from home with complaints of falling in the bathtub. Per EMS, pt was getting in the shower and fell face forward in the tub. Pts head was in the corner of the tub and neck extended for about 30 min before EMS arrived. Pt endorses tingling and weakness in upper and lower extremities. Reddness to forehead noted.

## 2017-12-13 NOTE — ED Notes (Signed)
Patient transported to CT 

## 2017-12-13 NOTE — Consult Note (Signed)
Reason for Consult:central cord injury Referring Physician: Guss Farruggia is an 67 y.o. male.  HPI: Patient reports that he was getting into the shower earlier this morning when he slipped on the wet bathtub causing him to fall forward. He reports that he hit his forehead on the bathtub but did not lose consciousness.  He was unable to move following the fall and he remained in neck extension for about 30 minutes until EMS was able to get him up.   Patient denied any lightheadedness or chest pain prior to the fall.  On examination in ER, patient was unable to use his hands.  Head CT was negative, cervical CT scan was negative for fracture, although there is multilevel degenerative changes with stenosis at C 45, C 56, C 67 levels.  There is increased cord signal in the cervical cord at the C 45 level, without active compression of the cervical spinal cord.     Past Medical History:  Diagnosis Date  . CAD S/P percutaneous coronary angioplasty 09/20/2016   Nstemi 08/2016. Dr. Doylene Canard. DES- brilinta and asa. Requests change to [facility] cardiology; 95% Ramus -> PCI Resolute Onyx DES 2.75 x 18  . Chronic combined systolic and diastolic heart failure ([place]) 10/09/2016   EF 45% and grade II diastolic after nstemi  . Diabetes mellitus without complication ([place])   . DJD (degenerative joint disease)   . Gout   . H/O non-ST elevation myocardial infarction (NSTEMI) 09/20/2016   95% Ramus - > PCI   . Hypertension   . Obesity     Past Surgical History:  Procedure Laterality Date  . CARDIAC CATHETERIZATION N/A 09/19/2016   Procedure: Left Heart Cath and Coronary Angiography;  Surgeon: Dixie Dials, MD;  Location: [facility];  Service: Cardiovascular: 95% proximal Ramus Intermedius --> PCI  . CARDIAC CATHETERIZATION N/A 09/19/2016   Procedure: Coronary Stent Intervention;  Surgeon: Nelva Bush, MD;  Location: [facility];  Service: Cardiovascular: 95% ramus intermedius;  Resolute  Onyx 2.75 x 18 mm drug-eluting stent  . left knee open meniscetomy    . TRANSTHORACIC ECHOCARDIOGRAM  2004   no lvh nl ejection fraction  . TRANSTHORACIC ECHOCARDIOGRAM  09/19/2016   In setting of NSTEMI:  EF 45-50% with diffuse hypokinesis. GR 2 DD. Mild biatrial enlargement.  . ureteral stone  2025,4270    Family History  Problem Relation Age of Onset  . Liver disease Mother     Social History:  reports that he quit smoking about 40 years ago. His smoking use included cigarettes. He has a 13.00 pack-year smoking history. He has never used smokeless tobacco. He reports that he drinks alcohol. He reports that he does not use drugs.  Allergies:  Allergies  Allergen Reactions  . Ace Inhibitors     Angioedema  . Other Hives and Itching    msg    Medications: I have reviewed the patient's current medications.  Results for orders placed or performed during the hospital encounter of 12/13/17 (from the past 48 hour(s))  Comprehensive metabolic panel     Status: Abnormal   Collection Time: 12/13/17 10:50 AM  Result Value Ref Range   Sodium 137 135 - 145 mmol/L   Potassium 3.9 3.5 - 5.1 mmol/L   Chloride 104 101 - 111 mmol/L   CO2 21 (L) 22 - 32 mmol/L   Glucose, Bld 173 (H) 65 - 99 mg/dL   BUN 23 (H) 6 - 20 mg/dL   Creatinine,  Ser 1.17 0.61 - 1.24 mg/dL   Calcium 8.6 (L) 8.9 - 10.3 mg/dL   Total Protein 7.3 6.5 - 8.1 g/dL   Albumin 3.3 (L) 3.5 - 5.0 g/dL   AST 31 15 - 41 U/L   ALT 22 17 - 63 U/L   Alkaline Phosphatase 89 38 - 126 U/L   Total Bilirubin 0.8 0.3 - 1.2 mg/dL   GFR calc non Af Amer >60 >60 mL/min   GFR calc Af Amer >60 >60 mL/min    Comment: (NOTE) The eGFR has been calculated using the CKD EPI equation. This calculation has not been validated in all clinical situations. eGFR's persistently <60 mL/min signify possible Chronic Kidney Disease.    Anion gap 12 5 - 15    Comment: Performed at [facility] [address], [place] [address]  CBC      Status: None   Collection Time: 12/13/17 10:50 AM  Result Value Ref Range   WBC 10.1 4.0 - 10.5 K/uL   RBC 4.68 4.22 - 5.81 MIL/uL   Hemoglobin 13.9 13.0 - 17.0 g/dL   HCT 42.9 39.0 - 52.0 %   MCV 91.7 78.0 - 100.0 fL   MCH 29.7 26.0 - 34.0 pg   MCHC 32.4 30.0 - 36.0 g/dL   RDW 14.7 11.5 - 15.5 %   Platelets 173 150 - 400 K/uL    Comment: Performed at [facility], [employer] [address] [place], [place] [address]  Ethanol     Status: None   Collection Time: 12/13/17 10:50 AM  Result Value Ref Range   Alcohol, Ethyl (B) <10 <10 mg/dL    Comment:        LOWEST DETECTABLE LIMIT FOR SERUM ALCOHOL IS 10 mg/dL FOR MEDICAL PURPOSES ONLY Performed at [facility], [place] [address], [place], [place] [address]   Protime-INR     Status: None   Collection Time: 12/13/17 10:50 AM  Result Value Ref Range   Prothrombin Time 13.9 11.4 - 15.2 seconds   INR 1.08     Comment: Performed at [facility] [address] [place], [place] [address]  Sample to Blood Bank     Status: None   Collection Time: 12/13/17 10:50 AM  Result Value Ref Range   Blood Bank Specimen SAMPLE AVAILABLE FOR TESTING    Sample Expiration      12/14/2017 Performed at [facility], [place] [address], [place], [place] [address]   Hemoglobin A1c     Status: Abnormal   Collection Time: 12/13/17 10:50 AM  Result Value Ref Range   Hgb A1c MFr Bld 6.5 (H) 4.8 - 5.6 %    Comment: (NOTE) Pre diabetes:          5.7%-6.4% Diabetes:              >6.4% Glycemic control for   <7.0% adults with diabetes    Mean Plasma Glucose 139.85 mg/dL    Comment: Performed at [facility] [address], [place], [place] [address]  I-Stat Chem 8, ED     Status: Abnormal   Collection Time: 12/13/17 10:56 AM  Result Value Ref Range   Sodium 140 135 - 145 mmol/L   Potassium 4.3 3.5 - 5.1 mmol/L   Chloride 104 101 - 111 mmol/L   BUN 31 (H) 6 - 20 mg/dL   Creatinine, Ser 1.10 0.61 - 1.24 mg/dL   Glucose, Bld 175 (H)  65 - 99 mg/dL   Calcium, Ion 1.05 (L) 1.15 - 1.40 mmol/L   TCO2 26 22 - 32 mmol/L   Hemoglobin 15.0 13.0 - 17.0 g/dL   HCT 44.0 39.0 - 52.0 %  I-Stat CG4 Lactic Acid, ED     Status: None   Collection Time: 12/13/17 10:56 AM  Result Value Ref Range   Lactic Acid, Venous 1.76 0.5 - 1.9 mmol/L    Ct Head Wo Contrast  Result Date: 12/13/2017 CLINICAL DATA:  Status post fall in the bathtub today with a blow to the head. Neck pain. Initial encounter. EXAM: CT HEAD WITHOUT CONTRAST CT CERVICAL SPINE WITHOUT CONTRAST TECHNIQUE: Multidetector CT imaging of the head and cervical spine was performed following the standard protocol without intravenous contrast. Multiplanar CT image reconstructions of the cervical spine were also generated. COMPARISON:  None. FINDINGS: CT HEAD FINDINGS Brain: No evidence of acute infarction, hemorrhage, hydrocephalus, extra-axial collection or mass lesion/mass effect. Vascular: No hyperdense vessel or unexpected calcification. Skull: Intact. Sinuses/Orbits: Clear. Other: Soft tissue contusion anterior forehead noted. CT CERVICAL SPINE FINDINGS Alignment: Maintained.  Straightening of lordosis noted. Skull base and vertebrae: No acute fracture. No primary bone lesion or focal pathologic process. Soft tissues and spinal canal: No prevertebral fluid or swelling. No visible canal hematoma. Disc levels: Loss of disc space height and endplate spurring are seen at C5-6, C6-7 and to a lesser degree, C7-T1. Upper chest: Lung apices clear. Other: None. IMPRESSION: Soft tissue contusion anterior to the frontal bone without underlying fracture. No acute intracranial abnormality. No acute abnormality cervical spine. Degenerative disc disease most notable at C5-6 and C6-7. Electronically Signed   By: Inge Rise M.D.   On: 12/13/2017 12:35   Ct Cervical Spine Wo Contrast  Result Date: 12/13/2017 CLINICAL DATA:  Status post fall in the bathtub today with a blow to the head. Neck pain.  Initial encounter. EXAM: CT HEAD WITHOUT CONTRAST CT CERVICAL SPINE WITHOUT CONTRAST TECHNIQUE: Multidetector CT imaging of the head and cervical spine was performed following the standard protocol without intravenous contrast. Multiplanar CT image reconstructions of the cervical spine were also generated. COMPARISON:  None. FINDINGS: CT HEAD FINDINGS Brain: No evidence of acute infarction, hemorrhage, hydrocephalus, extra-axial collection or mass lesion/mass effect. Vascular: No hyperdense vessel or unexpected calcification. Skull: Intact. Sinuses/Orbits: Clear. Other: Soft tissue contusion anterior forehead noted. CT CERVICAL SPINE FINDINGS Alignment: Maintained.  Straightening of lordosis noted. Skull base and vertebrae: No acute fracture. No primary bone lesion or focal pathologic process. Soft tissues and spinal canal: No prevertebral fluid or swelling. No visible canal hematoma. Disc levels: Loss of disc space height and endplate spurring are seen at C5-6, C6-7 and to a lesser degree, C7-T1. Upper chest: Lung apices clear. Other: None. IMPRESSION: Soft tissue contusion anterior to the frontal bone without underlying fracture. No acute intracranial abnormality. No acute abnormality cervical spine. Degenerative disc disease most notable at C5-6 and C6-7. Electronically Signed   By: Inge Rise M.D.   On: 12/13/2017 12:35    Review of Systems - Negative except as above.  Patient has baseline left knee pain and hip pain with left leg weakness for which he uses a cane.    Blood pressure (!) 170/95, pulse 80, temperature 97.9 F (36.6 C), temperature source Oral, resp. rate 15, height 6' (1.829 m), weight (!) 171.5 kg (378 lb 1.4 oz), SpO2 96 %. Physical Exam  Constitutional: He is oriented to person, place, and time. He appears well-developed and well-nourished.  HENT:  Head: Normocephalic and atraumatic.  Eyes: Pupils are equal, round, and reactive to light. Conjunctivae and EOM are normal.   Neck:  Wearing well-fitting [place] collar  Neurological: He is alert and oriented to person, place, and time. A sensory deficit is present. No cranial nerve deficit. GCS eye subscore is 4. GCS verbal subscore is 5. GCS motor subscore is 6.  Reflex Scores:      Tricep reflexes are 0 on the right side and 0 on the left side.      Bicep reflexes are 0 on the right side and 0 on the left side.      Brachioradialis reflexes are 0 on the right side and 0 on the left side.      Patellar reflexes are 1+ on the right side and 1+ on the left side.      Achilles reflexes are 1+ on the right side and 1+ on the left side. Shoulder shrug intact.  Bilateral deltoid 4/5; Biceps 5/5 bilaterally, Triceps 4/5 bilaterally, Hand Intrinsics 1/5, wrist extension 2/5, wrist flexion 1/5.  Right leg full strength all groups.  Left leg limited by hip and knee pain.  Distal left leg appears strong, but hip flexor and quadriceps are weak at 4-/5.  Healed left knee incision.  Patient denies perineal numbness or difficulty with bladder or bowel control.    Both arms, especially hands are numb and have burning dysesthesias.  He denies numbness or burning in legs or perineum.    Assessment/Plan: 67 year old man with baseline left leg weakness who fell this morning and now has a central spinal cord injury with bilateral upper extremity numbness and weakness.  He is also complaining of diminished hearing in both ears.  He has cord signal changes at C 45 with cervical spinal stenosis at C 45, C 56, C 67 levels.  He will need PT, potentially Rehab, and no acute surgery, although after he has stabilized, he will likley require ACDF C 45, C 56, C 67 levels.  He should continue in [place] collar for the indefinite future.  He may be up in bed and mobilize with PT.  I will start decadron with insulin sliding scale coverage, gabapentin, and consult Rehab.  I will follow patient.  Peggyann Shoals, MD 12/13/2017, 4:39 PM

## 2017-12-13 NOTE — ED Notes (Signed)
Pt difficult IV start, unsuccessful attempt x2. EDP made aware for possible US IV.

## 2017-12-13 NOTE — ED Provider Notes (Signed)
[facility] EMERGENCY DEPARTMENT Provider Note  CSN: [id number] Arrival date & time: 12/13/17 1002  Chief Complaint(s) Fall  HPI Franklin Hicks is a 67 y.o. male   The history is provided by the patient.  Fall  This is a new problem. The current episode started 1 to 2 hours ago. Episode frequency: once. The problem has been resolved. Pertinent negatives include no chest pain, no abdominal pain, no headaches and no shortness of breath. Associated symptoms comments: Mild headache, frontal contusion. neck pain. upper extremity weakness and paresthesia.  Lower extremity paresthesias. Nothing aggravates the symptoms. Nothing relieves the symptoms.   Patient reports that he was getting into the shower earlier this morning when he slipped on the wet bathtub causing him to fall forward. He reports that he hit his forehead on the bathtub but did not lose consciousness.  He was unable to move following the fall and he remained in neck extension for about 30 minutes until EMS was able to get him up.   Patient denied any lightheadedness or chest pain prior to the fall.  Past Medical History Past Medical History:  Diagnosis Date  . CAD S/P percutaneous coronary angioplasty 09/20/2016   Nstemi 08/2016. Dr. Algie Coffer. DES- brilinta and asa. Requests change to [facility] cardiology; 95% Ramus -> PCI Resolute Onyx DES 2.75 x 18  . Chronic combined systolic and diastolic heart failure (HCC) 10/09/2016   EF 45% and grade II diastolic after nstemi  . Diabetes mellitus without complication (HCC)   . DJD (degenerative joint disease)   . Gout   . H/O non-ST elevation myocardial infarction (NSTEMI) 09/20/2016   95% Ramus - > PCI   . Hypertension   . Obesity    Patient Active Problem List   Diagnosis Date Noted  . Chronic combined systolic and diastolic heart failure (HCC) 10/09/2016  . CAD S/P percutaneous coronary angioplasty 09/20/2016  . H/O non-ST elevation myocardial infarction (NSTEMI)  09/20/2016  . Erectile dysfunction 07/30/2015  . Morbid obesity with BMI of 50.0-59.9, adult (HCC) 07/18/2013  . History of pulmonary embolism 07/13/2013  . Angioedema of lips 07/13/2013  . Multinodular goiter 07/13/2013  . Dyslipidemia, goal LDL below 70 10/02/2011  . SOB (shortness of breath) 10/02/2011  . BPH with urinary obstruction 07/17/2010  . Diabetes mellitus type 2, controlled (HCC) 07/17/2010  . Bilateral lower extremity edema 12/08/2008  . NEUROPATHY, IDIOPATHIC PERIPHERAL NEC 05/31/2007  . Osteoarthritis 03/22/2007  . Gout 03/15/2007  . Essential hypertension 03/15/2007   Home Medication(s) Prior to Admission medications   Medication Sig Start Date End Date Taking? Authorizing Provider  aspirin EC 81 MG tablet Take 81 mg by mouth daily.    [provider]  atorvastatin (LIPITOR) 80 MG tablet TAKE 1 TABLET (80 MG TOTAL) BY MOUTH DAILY AT 6 PM. 10/28/17   Marykay Lex, MD  Chromium 200 MCG CAPS Take by mouth.    [provider]  CINNAMON PO Take 1 capsule by mouth daily.    [provider]  clopidogrel (PLAVIX) 75 MG tablet Take 1 tablet (75 mg total) by mouth daily. 07/28/17   Marykay Lex, MD  furosemide (LASIX) 20 MG tablet Take 1 tablet (20 mg total) by mouth every Monday, Wednesday, and Friday. 07/20/17   Marykay Lex, MD  Krill Oil 1000 MG CAPS Take 1 capsule by mouth daily.    [provider]  metoprolol tartrate (LOPRESSOR) 50 MG tablet Take 1 tablet (50 mg total) by mouth  2 (two) times daily. 07/20/17   Marykay Lex, MD  Multiple Vitamin (MULTIVITAMIN WITH MINERALS) TABS tablet Take 1 tablet by mouth daily.    [provider]  nitroGLYCERIN (NITROSTAT) 0.4 MG SL tablet Place 1 tablet (0.4 mg total) under the tongue every 5 (five) minutes x 3 doses as needed for chest pain. Patient not taking: Reported on 09/24/2017 09/21/16   Orpah Cobb, MD  Potassium Citrate 15 MEQ (1620 MG) TBCR Take 1 tablet by mouth 2  (two) times daily. 08/10/17   [provider]  terazosin (HYTRIN) 10 MG capsule TAKE 1 CAPSULE (10 MG TOTAL) BY MOUTH AT BEDTIME. Patient taking differently: Take 15 mg by mouth at bedtime. TAKE 1 CAPSULE (10 MG TOTAL) BY MOUTH AT BEDTIME. 09/01/17   Shelva Majestic, MD  vitamin B-12 (CYANOCOBALAMIN) 100 MCG tablet Take 100 mcg by mouth daily.    [provider]                                                                                                                                    Past Surgical History Past Surgical History:  Procedure Laterality Date  . CARDIAC CATHETERIZATION N/A 09/19/2016   Procedure: Left Heart Cath and Coronary Angiography;  Surgeon: Orpah Cobb, MD;  Location: [facility] INVASIVE CV LAB;  Service: Cardiovascular: 95% proximal Ramus Intermedius --> PCI  . CARDIAC CATHETERIZATION N/A 09/19/2016   Procedure: Coronary Stent Intervention;  Surgeon: Yvonne Kendall, MD;  Location: [facility] INVASIVE CV LAB;  Service: Cardiovascular: 95% ramus intermedius;  Resolute Onyx 2.75 x 18 mm drug-eluting stent  . left knee open meniscetomy    . TRANSTHORACIC ECHOCARDIOGRAM  2004   no lvh nl ejection fraction  . TRANSTHORACIC ECHOCARDIOGRAM  09/19/2016   In setting of NSTEMI:  EF 45-50% with diffuse hypokinesis. GR 2 DD. Mild biatrial enlargement.  . ureteral stone  1610,9604   Family History Family History  Problem Relation Age of Onset  . Liver disease Mother     Social History Social History   Tobacco Use  . Smoking status: Former Smoker    Packs/day: 1.00    Years: 13.00    Pack years: 13.00    Types: Cigarettes    Last attempt to quit: 07/13/1977    Years since quitting: 40.4  . Smokeless tobacco: Never Used  Substance Use Topics  . Alcohol use: Yes    Alcohol/week: 0.0 oz    Comment: holidays only  . Drug use: No   Allergies Ace inhibitors and Other  Review of Systems Review of Systems  Respiratory: Negative for shortness of breath.     Cardiovascular: Negative for chest pain.  Gastrointestinal: Negative for abdominal pain.  Neurological: Negative for headaches.   All other systems are reviewed and are negative for acute change except as noted in the HPI  Physical Exam Vital Signs  I have reviewed the triage vital signs BP 136/62 (  BP Location: Right Arm)   Pulse 87   Temp 97.6 F (36.4 C) (Oral)   Ht 6' (1.829 m)   Wt (!) 170.1 kg (375 lb)   SpO2 98%   BMI 50.86 kg/m   Physical Exam  Constitutional: He is oriented to person, place, and time. He appears well-developed and well-nourished. No distress.  Obese  HENT:  Head: Normocephalic. Head is with contusion.    Right Ear: External ear normal.  Left Ear: External ear normal.  Mouth/Throat: Oropharynx is clear and moist.  Eyes: Pupils are equal, round, and reactive to light. Conjunctivae and EOM are normal. Right eye exhibits no discharge. Left eye exhibits no discharge. No scleral icterus.  Neck: Normal range of motion. Neck supple. Spinous process tenderness and muscular tenderness present.  Cardiovascular: Regular rhythm and normal heart sounds. Exam reveals no gallop and no friction rub.  No murmur heard. Pulses:      Radial pulses are 2+ on the right side, and 2+ on the left side.       Dorsalis pedis pulses are 2+ on the right side, and 2+ on the left side.  Pulmonary/Chest: Effort normal and breath sounds normal. No stridor. No respiratory distress.  Abdominal: Soft. He exhibits no distension. There is no tenderness.  Musculoskeletal:       Cervical back: He exhibits no bony tenderness.       Thoracic back: He exhibits no bony tenderness.       Lumbar back: He exhibits no bony tenderness.  Clavicle stable. Chest stable to AP/Lat compression. Pelvis stable to Lat compression. No obvious extremity deformity. No chest or abdominal wall contusion.  Neurological: He is alert and oriented to person, place, and time. GCS eye subscore is 4. GCS verbal  subscore is 5. GCS motor subscore is 6.  Patient with decreased bilateral upper extremity strength, mostly with gripping and elbow extension. BLE strength intact. Patient  decrease intact sensation throughout.     Skin: Skin is warm. He is not diaphoretic.    ED Results and Treatments Labs (all labs ordered are listed, but only abnormal results are displayed) Labs Reviewed  COMPREHENSIVE METABOLIC PANEL - Abnormal; Notable for the following components:      Result Value   CO2 21 (*)    Glucose, Bld 173 (*)    BUN 23 (*)    Calcium 8.6 (*)    Albumin 3.3 (*)    All other components within normal limits  HEMOGLOBIN A1C - Abnormal; Notable for the following components:   Hgb A1c MFr Bld 6.5 (*)    All other components within normal limits  GLUCOSE, CAPILLARY - Abnormal; Notable for the following components:   Glucose-Capillary 125 (*)    All other components within normal limits  I-STAT CHEM 8, ED - Abnormal; Notable for the following components:   BUN 31 (*)    Glucose, Bld 175 (*)    Calcium, Ion 1.05 (*)    All other components within normal limits  MRSA PCR SCREENING  CBC  ETHANOL  PROTIME-INR  CDS SEROLOGY  URINALYSIS, ROUTINE W REFLEX MICROSCOPIC  COMPREHENSIVE METABOLIC PANEL  CBC  I-STAT CG4 LACTIC ACID, ED  SAMPLE TO BLOOD BANK  EKG  EKG Interpretation  Date/Time:  Sunday December 13 2017 13:35:12 EDT Ventricular Rate:  82 PR Interval:    QRS Duration: 91 QT Interval:  396 QTC Calculation: 463 R Axis:   31 Text Interpretation:  Sinus rhythm Consider left atrial enlargement Borderline T abnormalities, inferior leads No significant change since last tracing Confirmed by Drema Pry [phone]) on 12/13/2017 7:30:01 PM      Radiology Ct Head Wo Contrast  Result Date: 12/13/2017 CLINICAL DATA:  Status post fall in the bathtub today with a blow to  the head. Neck pain. Initial encounter. EXAM: CT HEAD WITHOUT CONTRAST CT CERVICAL SPINE WITHOUT CONTRAST TECHNIQUE: Multidetector CT imaging of the head and cervical spine was performed following the standard protocol without intravenous contrast. Multiplanar CT image reconstructions of the cervical spine were also generated. COMPARISON:  None. FINDINGS: CT HEAD FINDINGS Brain: No evidence of acute infarction, hemorrhage, hydrocephalus, extra-axial collection or mass lesion/mass effect. Vascular: No hyperdense vessel or unexpected calcification. Skull: Intact. Sinuses/Orbits: Clear. Other: Soft tissue contusion anterior forehead noted. CT CERVICAL SPINE FINDINGS Alignment: Maintained.  Straightening of lordosis noted. Skull base and vertebrae: No acute fracture. No primary bone lesion or focal pathologic process. Soft tissues and spinal canal: No prevertebral fluid or swelling. No visible canal hematoma. Disc levels: Loss of disc space height and endplate spurring are seen at C5-6, C6-7 and to a lesser degree, C7-T1. Upper chest: Lung apices clear. Other: None. IMPRESSION: Soft tissue contusion anterior to the frontal bone without underlying fracture. No acute intracranial abnormality. No acute abnormality cervical spine. Degenerative disc disease most notable at C5-6 and C6-7. Electronically Signed   By: Drusilla Kanner M.D.   On: 12/13/2017 12:35   Ct Cervical Spine Wo Contrast  Result Date: 12/13/2017 CLINICAL DATA:  Status post fall in the bathtub today with a blow to the head. Neck pain. Initial encounter. EXAM: CT HEAD WITHOUT CONTRAST CT CERVICAL SPINE WITHOUT CONTRAST TECHNIQUE: Multidetector CT imaging of the head and cervical spine was performed following the standard protocol without intravenous contrast. Multiplanar CT image reconstructions of the cervical spine were also generated. COMPARISON:  None. FINDINGS: CT HEAD FINDINGS Brain: No evidence of acute infarction, hemorrhage, hydrocephalus,  extra-axial collection or mass lesion/mass effect. Vascular: No hyperdense vessel or unexpected calcification. Skull: Intact. Sinuses/Orbits: Clear. Other: Soft tissue contusion anterior forehead noted. CT CERVICAL SPINE FINDINGS Alignment: Maintained.  Straightening of lordosis noted. Skull base and vertebrae: No acute fracture. No primary bone lesion or focal pathologic process. Soft tissues and spinal canal: No prevertebral fluid or swelling. No visible canal hematoma. Disc levels: Loss of disc space height and endplate spurring are seen at C5-6, C6-7 and to a lesser degree, C7-T1. Upper chest: Lung apices clear. Other: None. IMPRESSION: Soft tissue contusion anterior to the frontal bone without underlying fracture. No acute intracranial abnormality. No acute abnormality cervical spine. Degenerative disc disease most notable at C5-6 and C6-7. Electronically Signed   By: Drusilla Kanner M.D.   On: 12/13/2017 12:35   Mr Cervical Spine Wo Contrast  Result Date: 12/13/2017 CLINICAL DATA:  67 y/o M; fall in bathtub. Tingling and weakness in the upper and lower extremities. EXAM: MRI CERVICAL SPINE WITHOUT CONTRAST TECHNIQUE: Multiplanar, multisequence MR imaging of the cervical spine was performed. No intravenous contrast was administered. COMPARISON:  12/13/2017 CT cervical spine. FINDINGS: Alignment: Straightening of cervical lordosis. Vertebrae: No fracture, evidence of discitis, or bone lesion. Cord: Faint increased T2 cord signal in gray matter at the C4 level (  series 16109, image 23). No cord hemorrhage, cord expansion, or cord volume loss. Posterior Fossa, vertebral arteries, paraspinal tissues: Several punctate foci of susceptibility are present within the right inferior cerebellar hemisphere, probably hemosiderin deposition from chronic microhemorrhage. Mild prevertebral edema from C1-T2. Disc levels: C2-3: No significant disc displacement, foraminal stenosis, or canal stenosis. Mild facet hypertrophy.  C3-4: Small disc bulge with uncovertebral and facet hypertrophy eccentric to the left. Mild left foraminal stenosis. Mild canal stenosis. C4-5: Disc osteophyte complex with bilateral uncovertebral and facet hypertrophy. Moderate bilateral foraminal stenosis. Mild canal stenosis. Disc contact on the anterior cord. C5-6: Disc osteophyte complex with left-greater-than-right uncovertebral and facet hypertrophy. Moderate right and severe left foraminal stenosis. Moderate canal stenosis with mild cord flattening. C6-7: Disc osteophyte complex with right greater than left uncovertebral and facet hypertrophy. Severe right and moderate left foraminal stenosis. Moderate canal stenosis with mild cord flattening. C7-T1: Small foraminal endplate marginal osteophytes and left-greater-than-right facet hypertrophy. Moderate left foraminal stenosis. No canal stenosis. IMPRESSION: 1. Mild prevertebral edema, probably related to muscle strain and/or soft tissue injury. No evidence for spinal fracture or ligamentous injury. No malalignment. 2. Increase central cord signal at C4 level may represent a mild cord contusion. Differential includes chronic sequelae of compressive myelopathy or prior infectious/inflammatory process. 3. Cervical spondylosis greatest at the C5-6 and C6-7 levels. 4. Moderate C5-6 and C6-7 canal stenosis. No high-grade canal stenosis. 5. Severe left C5-6 and severe right C6-7 foraminal stenosis. Multilevel mild and moderate foraminal stenosis. These results will be called to the ordering clinician or representative by the Radiologist Assistant, and communication documented in the PACS or zVision Dashboard. Electronically Signed   By: Mitzi Hansen M.D.   On: 12/13/2017 17:35   Pertinent labs & imaging results that were available during my care of the patient were reviewed by me and considered in my medical decision making (see chart for details).  Medications Ordered in ED Medications  0.9 %   sodium chloride infusion ( Intravenous New Bag/Given 12/13/17 1100)                                                                                                                                    Procedures Procedures Emergency Ultrasound Study:   Angiocath insertion Performed by: Amadeo Garnet Cardama  Consent: Verbal consent obtained. Risks and benefits: risks, benefits and alternatives were discussed Immediately prior to procedure the correct patient, procedure, equipment, support staff and site/side marked as needed.  Indication: difficult IV access Preparation: Patient and probe were prepped with chlorhexidine or alcohol. Sterile gel used. Vein Location: right cephalic vein was visualized during assessment for potential access sites and was found to be patent/ easily compressed with linear ultrasound.  1 inch, 20 gauge needle was visualized with real-time ultrasound and guided into the vein.  Image not saved due to machine dysfunction  Normal blood return.  Patient tolerance: Patient tolerated the procedure well with no  immediate complications.  CRITICAL CARE Performed by: Amadeo GarnetPedro Eduardo Cardama Total critical care time: 40 minutes Critical care time was exclusive of separately billable procedures and treating other patients. Critical care was necessary to treat or prevent imminent or life-threatening deterioration. Critical care was time spent personally by me on the following activities: development of treatment plan with patient and/or surrogate as well as nursing, discussions with consultants, evaluation of patient's response to treatment, examination of patient, obtaining history from patient or surrogate, ordering and performing treatments and interventions, ordering and review of laboratory studies, ordering and review of radiographic studies, pulse oximetry and re-evaluation of patient's condition.    (including critical care time)  Medical Decision Making / ED Course I  have reviewed the nursing notes for this encounter and the patient's prior records (if available in EHR or on provided paperwork).     ABCs intact.  Secondary as above notable for neurologic deficits to the bilateral upper extremities concerning for central cord syndrome.  Patient does have mild midline cervical tenderness.  He was placed in a cervical collar.    He was upgraded to a level 2 trauma due to the neuro deficits.  Targeted work-up was obtained.  Pt given IVF, pain medicine and IV steroids.  CT head negative. CT cervical spine w/o fracture. Will need MRI to assess for cord injury.  Case discussed with Dr. Venetia MaxonStern from [facility], who requested medicine admission to SDU due to comorbities. He will continue to follow.   Final Clinical Impression(s) / ED Diagnoses Final diagnoses:  Upper extremity weakness  Paresthesia  Fall, initial encounter  Contusion of forehead, initial encounter      This chart was dictated using voice recognition software.  Despite best efforts to proofread,  errors can occur which can change the documentation meaning.   Nira Connardama, Pedro Eduardo, MD 12/13/17 1932

## 2017-12-14 DIAGNOSIS — S14106S Unspecified injury at C6 level of cervical spinal cord, sequela: Secondary | ICD-10-CM

## 2017-12-14 DIAGNOSIS — G9520 Unspecified cord compression: Secondary | ICD-10-CM

## 2017-12-14 LAB — COMPREHENSIVE METABOLIC PANEL
ALBUMIN: 3 g/dL — AB (ref 3.5–5.0)
ALK PHOS: 83 U/L (ref 38–126)
ALT: 24 U/L (ref 17–63)
AST: 34 U/L (ref 15–41)
Anion gap: 9 (ref 5–15)
BUN: 16 mg/dL (ref 6–20)
CALCIUM: 8.5 mg/dL — AB (ref 8.9–10.3)
CO2: 20 mmol/L — ABNORMAL LOW (ref 22–32)
CREATININE: 0.92 mg/dL (ref 0.61–1.24)
Chloride: 105 mmol/L (ref 101–111)
GFR calc Af Amer: >60 mL/min (ref >60)
GLUCOSE: 158 mg/dL — AB (ref 65–99)
Potassium: 3.9 mmol/L (ref 3.5–5.1)
Sodium: 134 mmol/L — ABNORMAL LOW (ref 135–145)
TOTAL PROTEIN: 6.9 g/dL (ref 6.5–8.1)
Total Bilirubin: 0.7 mg/dL (ref 0.3–1.2)

## 2017-12-14 LAB — CBC
HCT: 41.2 % (ref 39.0–52.0)
Hemoglobin: 13.5 g/dL (ref 13.0–17.0)
MCH: 29.6 pg (ref 26.0–34.0)
MCHC: 32.8 g/dL (ref 30.0–36.0)
MCV: 90.4 fL (ref 78.0–100.0)
PLATELETS: 202 10*3/uL (ref 150–400)
RBC: 4.56 MIL/uL (ref 4.22–5.81)
RDW: 14.8 % (ref 11.5–15.5)
WBC: 10.2 10*3/uL (ref 4.0–10.5)

## 2017-12-14 LAB — GLUCOSE, CAPILLARY
GLUCOSE-CAPILLARY: 152 mg/dL — AB (ref 65–99)
GLUCOSE-CAPILLARY: 182 mg/dL — AB (ref 65–99)
GLUCOSE-CAPILLARY: 145 mg/dL — AB (ref 65–99)
GLUCOSE-CAPILLARY: 143 mg/dL — AB (ref 65–99)

## 2017-12-14 MED ORDER — CINNAMON 500 MG PO CAPS: ORAL_CAPSULE | Freq: Every day | ORAL | Status: DC

## 2017-12-14 MED ORDER — POTASSIUM CITRATE ER 10 MEQ (1080 MG) PO TBCR
10.0000 meq | EXTENDED_RELEASE_TABLET | Freq: Three times a day (TID) | ORAL | Status: DC
  Administered 2017-12-14 – 2017-12-17 (×9): 10 meq via ORAL
  Filled 2017-12-14 (×11): qty 1

## 2017-12-14 MED ORDER — CLOPIDOGREL BISULFATE 75 MG PO TABS
75.0000 mg | ORAL_TABLET | Freq: Every day | ORAL | Status: DC
  Administered 2017-12-15 – 2017-12-17 (×3): 75 mg via ORAL
  Filled 2017-12-14 (×3): qty 1

## 2017-12-14 MED ORDER — ADULT MULTIVITAMIN W/MINERALS CH
1.0000 | ORAL_TABLET | Freq: Every day | ORAL | Status: DC
  Administered 2017-12-15 – 2017-12-17 (×3): 1 via ORAL
  Filled 2017-12-14 (×3): qty 1

## 2017-12-14 MED ORDER — INSULIN ASPART 100 UNIT/ML ~~LOC~~ SOLN: 0.0000 [IU] | Freq: Every day | SUBCUTANEOUS | Status: DC

## 2017-12-14 MED ORDER — KRILL OIL 1000 MG PO CAPS: 1000.0000 mg | ORAL_CAPSULE | Freq: Every day | ORAL | Status: DC

## 2017-12-14 MED ORDER — ORAL CARE MOUTH RINSE
15.0000 mL | Freq: Two times a day (BID) | OROMUCOSAL | Status: DC
  Administered 2017-12-14 – 2017-12-17 (×5): 15 mL via OROMUCOSAL

## 2017-12-14 MED ORDER — VITAMIN B-12 100 MCG PO TABS
100.0000 ug | ORAL_TABLET | Freq: Every day | ORAL | Status: DC
  Administered 2017-12-15 – 2017-12-17 (×3): 100 ug via ORAL
  Filled 2017-12-14 (×3): qty 1

## 2017-12-14 MED ORDER — HEPARIN SODIUM (PORCINE) 5000 UNIT/ML IJ SOLN
5000.0000 [IU] | Freq: Three times a day (TID) | INTRAMUSCULAR | Status: DC
  Administered 2017-12-14 – 2017-12-15 (×3): 5000 [IU] via SUBCUTANEOUS
  Filled 2017-12-14 (×4): qty 1

## 2017-12-14 MED ORDER — FUROSEMIDE 20 MG PO TABS
20.0000 mg | ORAL_TABLET | ORAL | Status: DC
  Administered 2017-12-16: 20 mg via ORAL
  Filled 2017-12-14: qty 1

## 2017-12-14 MED ORDER — ATORVASTATIN CALCIUM 80 MG PO TABS: 80.0000 mg | ORAL_TABLET | Freq: Every day | ORAL | Status: DC

## 2017-12-14 MED ORDER — CINNAMON 500 MG PO CAPS: 1.0000 | ORAL_CAPSULE | Freq: Every day | ORAL | Status: DC

## 2017-12-14 MED ORDER — INSULIN ASPART 100 UNIT/ML ~~LOC~~ SOLN
0.0000 [IU] | Freq: Three times a day (TID) | SUBCUTANEOUS | Status: DC
  Administered 2017-12-14 – 2017-12-16 (×3): 2 [IU] via SUBCUTANEOUS
  Administered 2017-12-17: 3 [IU] via SUBCUTANEOUS
  Administered 2017-12-17: 2 [IU] via SUBCUTANEOUS

## 2017-12-14 MED ORDER — CHROMIUM 200 MCG PO CAPS: 200.0000 ug | ORAL_CAPSULE | Freq: Every day | ORAL | Status: DC

## 2017-12-14 NOTE — Progress Notes (Signed)
1900: Handoff report received from RN. Pt resting with daughter at bedside. Discussed plan of care for the shift; pt amenable to plan.  0000: Pt resting comfortably.  0029: 20 beat run of V tach while pt attempting to void on bedpan; asymptomatic. [facility] on call provider notified. No new orders.  0400: Pt continues resting comfortably.  0700: Handoff report given to RN. No acute events overnight.

## 2017-12-14 NOTE — Evaluation (Signed)
Occupational Therapy Evaluation Patient Details Name: Franklin Hicks MRN: [id number] DOB: 12-17-1950 Today's Date: 12/14/2017    History of Present Illness This 67 y.o. male admitted after falling and striking head in bathroom while attempting to step into tub.  Cervical MRI showed multi level DDD with moderate canal stenosis C4/5, severe Lt C5/6, and severe Rt stenosis C6/7 with increased cord signal C4.   PMH includes: NSTEMI, obesity, Gout, DM, CAD, systolic and diastolic HF    Clinical Impression   Pt admitted with above. He demonstrates the below listed deficits and will benefit from continued OT to maximize safety and independence with BADLs.  Pt presents to OT with quadraparesis Lt side weaker than Rt.  He requires max - total A for all Aspects of ADLs and max - total A +3 for bed mobility.  He lives with wife at home, and was independent with ADLs, and mod I with ambulation.   He will need extensive post acute rehab - recommend CIR. He will benefit from Vital tilt n go bed - spoke with MD who states he will order.        Follow Up Recommendations  CIR;Supervision/Assistance - 24 hour    Equipment Recommendations  None recommended by OT    Recommendations for Other Services Rehab consult     Precautions / Restrictions Precautions Precautions: Cervical Precaution Comments: reviewed cervical precautions  Required Braces or Orthoses: Cervical Brace Cervical Brace: Hard collar      Mobility Bed Mobility Overal bed mobility: Needs Assistance Bed Mobility: Rolling;Sidelying to Sit;Sit to Sidelying Rolling: Total assist;+2 for physical assistance;+2 for safety/equipment Sidelying to sit: Total assist;+2 for physical assistance;+2 for safety/equipment     Sit to sidelying: Max assist;+2 for physical assistance;+2 for safety/equipment General bed mobility comments: 3 assist provided. Requires assist to for all aspects.  He was able to assist with lowering trunk toward bed    Transfers                 General transfer comment: unable to safely attempt     Balance Overall balance assessment: Needs assistance Sitting-balance support: Feet supported;Single extremity supported Sitting balance-[name]: Poor Sitting balance - Comments: initial                                    ADL either performed or assessed with clinical judgement   ADL Overall ADL's : Needs assistance/impaired Eating/Feeding: Maximal assistance;Bed level;Sitting   Grooming: Wash/dry face;Moderate assistance;Sitting Grooming Details (indicate cue type and reason): mod A to wash face  Upper Body Bathing: Maximal assistance;Sitting   Lower Body Bathing: Total assistance;Bed level   Upper Body Dressing : Total assistance;Sitting   Lower Body Dressing: Total assistance;Sit to/from stand   Toilet Transfer: Total assistance;+2 for physical assistance Toilet Transfer Details (indicate cue type and reason): unable  Toileting- Clothing Manipulation and Hygiene: Total assistance;+2 for physical assistance;Bed level       Functional mobility during ADLs: Total assistance;+2 for physical assistance       Vision Baseline Vision/History: Wears glasses Wears Glasses: At all times Patient Visual Report: No change from baseline       Perception Perception Perception Tested?: Yes   Praxis Praxis Praxis tested?: Within functional limits    Pertinent Vitals/Pain Pain Assessment: Faces Faces Pain Scale: Hurts little more Pain Location: bil. UEs  Pain Descriptors / Indicators: Pins and needles Pain Intervention(s): Monitored during session;Repositioned  Hand Dominance Right   Extremity/Trunk Assessment Upper Extremity Assessment Upper Extremity Assessment: RUE deficits/detail;LUE deficits/detail RUE Deficits / Details: shoulder 3-/5 flexion and abduction; elbow flex/ext 3/5; forearm 3-/5; wrist 3+/5; hand flex/ext 3+/5.  Able to oppose digit 2  RUE  Sensation: (intact to light touch ) RUE Coordination: decreased fine motor;decreased gross motor LUE Deficits / Details: shoulder 3-/5 flex and abd; elbow flex 3-/5, ext 3/5, forearm 3-/5; wrist 3/5; hand 1/5 except thumb 2/5  LUE Sensation: (intact LT ) LUE Coordination: decreased fine motor;decreased gross motor   Lower Extremity Assessment Lower Extremity Assessment: Defer to PT evaluation   Cervical / Trunk Assessment Cervical / Trunk Assessment: Other exceptions Cervical / Trunk Exceptions: Pt in cervical collar.      Communication Communication Communication: No difficulties   Cognition Arousal/Alertness: Awake/alert Behavior During Therapy: WFL for tasks assessed/performed Overall Cognitive Status: Within Functional Limits for tasks assessed                                 General Comments: No apparent cognitive deficits noted    General Comments  VSS.  Wife present     Exercises Exercises: Other exercises Other Exercises Other Exercises: Pt instructed to work on Lt hand flexion/ext and bil shoulder flexion    Shoulder Instructions      Home Living Family/patient expects to be discharged to:: Private residence Living Arrangements: Spouse/significant other Available Help at Discharge: Family Type of Home: House Home Access: Stairs to enter Enter[employer] CorporationEntrance Stairs-Number of Steps: 6(2 in the back to get onto main level wiht bedroom)   Home Layout: Multi-level;Able to live on main level with bedroom/bathroom;Laundry or work area in [place] Alternate Level Stairs-Number of Steps: 6   Bathroom Shower/Tub: Chief Strategy OfficerTub/shower unit   Bathroom Toilet: Standard     Home Equipment: [employer] Service[employer] CompanyCane - single point;Grab bars - tub/shower   Additional Comments: back entrance with 2 steps that would get you to the main level where the kitchen and bed/bath are if it is not muddy they can pull the car around to the steps.       Prior Functioning/Environment Level of Independence: Needs  assistance  Gait / Transfers Assistance Needed: walking with 2 canes all the time ADL's / Homemaking Assistance Needed: bathed and dressed independently   Comments: drives, retired Programmer, systemseducator (taught PE)        OT Problem List: Decreased strength;Decreased range of motion;Decreased activity tolerance;Impaired balance (sitting and/or standing);Decreased coordination;Decreased knowledge of use of DME or AE;Decreased knowledge of precautions;Obesity;Impaired UE functional use      OT Treatment/Interventions: Self-care/ADL training;Therapeutic exercise;Neuromuscular education;DME and/or AE instruction;Therapeutic activities;Patient/family education;Balance training;Splinting    OT Goals(Current goals can be found in the care plan section) Acute Rehab OT Goals Patient Stated Goal: to get back to normal  OT Goal Formulation: With patient/family Time For Goal Achievement: 12/28/17 Potential to Achieve Goals: Good ADL Goals Pt Will Perform Eating: with modified independence;with adaptive utensils;with assist to don/doff brace/orthosis;bed level;sitting Pt Will Perform Grooming: with set-up;with adaptive equipment;sitting Pt Will Perform Upper Body Bathing: with mod assist;with adaptive equipment;sitting;bed level Pt Will Transfer to Toilet: with mod assist;with +2 assist;with transfer board;squat pivot transfer;bedside commode Pt/caregiver will Perform Home Exercise Program: Increased ROM;Increased strength;Right Upper extremity;Left upper extremity;With Supervision;With written HEP provided  OT Frequency: Min 2X/week   Barriers to D/C:            Co-evaluation PT/OT/SLP Co-Evaluation/Treatment: Yes Reason for Co-Treatment:  Complexity of the patient's impairments (multi-system involvement);For patient/therapist safety;To address functional/ADL transfers   OT goals addressed during session: ADL's and self-care;Strengthening/ROM      AM-PAC PT "6 Clicks" Daily Activity     Outcome  Measure Help from another person eating meals?: A Lot Help from another person taking care of personal grooming?: A Lot Help from another person toileting, which includes using toliet, bedpan, or urinal?: Total Help from another person bathing (including washing, rinsing, drying)?: Total Help from another person to put on and taking off regular upper body clothing?: Total Help from another person to put on and taking off regular lower body clothing?: Total 6 Click Score: 8   End of Session Equipment Utilized During Treatment: Cervical collar Nurse Communication: Mobility status  Activity Tolerance: Patient tolerated treatment well Patient left: in bed;with call bell/phone within reach;with family/visitor present  OT Visit Diagnosis: Muscle weakness (generalized) (M62.81)                Time: 1610-9604 OT Time Calculation (min): 62 min Charges:  OT General Charges $OT Visit: 1 Visit OT Evaluation $OT Eval Moderate Complexity: 1 Mod OT Treatments $Therapeutic Activity: 8-22 mins G-Codes:     [employer], OTR/L [phone]   Jeani Hawking M 12/14/2017, 3:31 PM

## 2017-12-14 NOTE — Consult Note (Signed)
Physical Medicine and Rehabilitation Consult   Reason for Consult:  Central cord injury Referring Physician:  Dr. Venetia Maxon   HPI: Franklin Hicks is a 67 y.o. male with history of CAD, T2DM, HTN who was admitted on 12/13/17 after falling forwards in the bathroom with inability to move. He reported numbness and tingling bilateral hands and BLE. MRI Cervical spins showed  multilevel DDD with moderate canal stenosis C4/5, severe left C5/6 and severe right stenosis C6/7 with increase in cord signal C4.  He was evaluated by Dr. Venetia Maxon who recommended steroids to help manage central cord injury with extensive rehab for central cord injury and collar for stabilization.   Patient reports that he was independent for ADLs and ambulated with 2 canes. He reports improvement in strength and coordination RUE> LUE . Therapy evaluations to be completed today. CIR recommended due to rehab needs and functional deficits.    Review of Systems  Constitutional: Negative for chills and fever.  HENT: Negative for hearing loss and tinnitus.   Eyes: Negative for blurred vision and double vision.  Respiratory: Negative for cough and hemoptysis.   Cardiovascular: Negative for chest pain and palpitations.  Gastrointestinal: Negative for abdominal pain, constipation, heartburn and nausea.  Genitourinary: Negative for dysuria and urgency.  Musculoskeletal: Positive for back pain and myalgias (knees).  Skin: Negative for itching and rash.  Neurological: Positive for sensory change and focal weakness. Negative for dizziness and headaches.  Psychiatric/Behavioral: Negative for memory loss. The patient does not have insomnia.      Past Medical History:  Diagnosis Date  . CAD S/P percutaneous coronary angioplasty 09/20/2016   Nstemi 08/2016. Dr. Algie Coffer. DES- brilinta and asa. Requests change to [facility] cardiology; 95% Ramus -> PCI Resolute Onyx DES 2.75 x 18  . Chronic combined systolic and diastolic heart failure (HCC)  3/66/4403   EF 45% and grade II diastolic after nstemi  . Diabetes mellitus without complication (HCC)   . DJD (degenerative joint disease)   . Gout   . H/O non-ST elevation myocardial infarction (NSTEMI) 09/20/2016   95% Ramus - > PCI   . Hypertension   . Obesity     Past Surgical History:  Procedure Laterality Date  . CARDIAC CATHETERIZATION N/A 09/19/2016   Procedure: Left Heart Cath and Coronary Angiography;  Surgeon: Orpah Cobb, MD;  Location: [facility] INVASIVE CV LAB;  Service: Cardiovascular: 95% proximal Ramus Intermedius --> PCI  . CARDIAC CATHETERIZATION N/A 09/19/2016   Procedure: Coronary Stent Intervention;  Surgeon: Yvonne Kendall, MD;  Location: [facility] INVASIVE CV LAB;  Service: Cardiovascular: 95% ramus intermedius;  Resolute Onyx 2.75 x 18 mm drug-eluting stent  . left knee open meniscetomy    . TRANSTHORACIC ECHOCARDIOGRAM  2004   no lvh nl ejection fraction  . TRANSTHORACIC ECHOCARDIOGRAM  09/19/2016   In setting of NSTEMI:  EF 45-50% with diffuse hypokinesis. GR 2 DD. Mild biatrial enlargement.  . ureteral stone  4742,5956    Family History  Problem Relation Age of Onset  . Liver disease Mother     Social History:  Married. Retired. Relative at home does most of home management/meals.  He reports that he quit smoking about 40 years ago. His smoking use included cigarettes. He has a 13.00 pack-year smoking history. He has never used smokeless tobacco. He reports that he drinks alcohol. He reports that he does not use drugs.    Allergies  Allergen Reactions  . Ace Inhibitors     Angioedema  .  Other Hives and Itching    msg    Medications Prior to Admission  Medication Sig Dispense Refill  . atorvastatin (LIPITOR) 80 MG tablet TAKE 1 TABLET (80 MG TOTAL) BY MOUTH DAILY AT 6 PM. 30 tablet 3  . Chromium 200 MCG CAPS Take 200 mcg by mouth daily.     Marland Kitchen CINNAMON PO Take 1 capsule by mouth daily.    . clopidogrel (PLAVIX) 75 MG tablet Take 1 tablet (75 mg total) by  mouth daily. 30 tablet 11  . furosemide (LASIX) 20 MG tablet Take 1 tablet (20 mg total) by mouth every Monday, Wednesday, and Friday. 15 tablet 3  . Krill Oil 1000 MG CAPS Take 1,000 mg by mouth daily.     . metoprolol tartrate (LOPRESSOR) 50 MG tablet Take 1 tablet (50 mg total) by mouth 2 (two) times daily. 60 tablet 3  . Multiple Vitamin (MULTIVITAMIN WITH MINERALS) TABS tablet Take 1 tablet by mouth daily.    . Potassium Citrate 15 MEQ (1620 MG) TBCR Take 1 tablet by mouth 2 (two) times daily.  11  . terazosin (HYTRIN) 10 MG capsule TAKE 1 CAPSULE (10 MG TOTAL) BY MOUTH AT BEDTIME. (Patient taking differently: Take 10 mg by mouth at bedtime. TAKE 1 CAPSULE (10 MG TOTAL) BY MOUTH AT BEDTIME.) 30 capsule 5  . vitamin B-12 (CYANOCOBALAMIN) 100 MCG tablet Take 100 mcg by mouth daily.      Home:    Functional History:   Functional Status:  Mobility:          ADL:    Cognition: Cognition Orientation Level: Oriented X4    Blood pressure (!) 160/95, pulse 86, temperature 98.5 F (36.9 C), temperature source Oral, resp. rate (!) 26, height 6' (1.829 m), weight (!) 172.6 kg (380 lb 8.2 oz), SpO2 95 %. Physical Exam  Nursing note and vitals reviewed. Constitutional: He is oriented to person, place, and time. He appears well-developed and well-nourished. No distress.  Morbidly obese male in NAD. Lying in bed and  being fed by daughter.   HENT:  Head: Normocephalic and atraumatic.  Mouth/Throat: Oropharynx is clear and moist.  Eyes: Pupils are equal, round, and reactive to light. Conjunctivae and EOM are normal.  Neck:  Cervical collar in place.   Cardiovascular: Normal rate and regular rhythm.  Respiratory: Effort normal and breath sounds normal. No stridor. No respiratory distress. He has no wheezes.  GI: Soft. Bowel sounds are normal.  Musculoskeletal: He exhibits edema.  Neurological: He is alert and oriented to person, place, and time. Coordination abnormal.  Speech clear.   He is able to answer orientation questions and follow basic commands without difficulty. RUE 5/5 biceps, shoulder, 3+ wrist ext and triceps, 2 H I. LUE: 5/5 biceps, deltoid, 1- 2/5 wrist/triceps, 1/5 HI. LE: 1 to 1+/5 prox to distal. RLE: 2 to 2+/5 prox to distal. Decreased LT left hand, legs in inconsistent distribution. DTR's 3+ bilateral knees, 1/4 resting tone in legs  Skin: Skin is warm and dry. He is not diaphoretic.  Psychiatric: He has a normal mood and affect. His behavior is normal. Thought content normal.    Results for orders placed or performed during the hospital encounter of 12/13/17 (from the past 24 hour(s))  Urinalysis, Routine w reflex microscopic     Status: Abnormal   Collection Time: 12/13/17 10:36 AM  Result Value Ref Range   Color, Urine YELLOW YELLOW   APPearance CLOUDY (A) CLEAR   Specific Gravity, Urine 1.021  1.005 - 1.030   pH 5.0 5.0 - 8.0   Glucose, UA NEGATIVE NEGATIVE mg/dL   Hgb urine dipstick LARGE (A) NEGATIVE   Bilirubin Urine NEGATIVE NEGATIVE   Ketones, ur NEGATIVE NEGATIVE mg/dL   Protein, ur NEGATIVE NEGATIVE mg/dL   Nitrite NEGATIVE NEGATIVE   Leukocytes, UA NEGATIVE NEGATIVE   RBC / HPF TOO NUMEROUS TO COUNT 0 - 5 RBC/hpf   WBC, UA 6-30 0 - 5 WBC/hpf   Bacteria, UA RARE (A) NONE SEEN   Squamous Epithelial / LPF 0-5 (A) NONE SEEN   Mucus PRESENT   Comprehensive metabolic panel     Status: Abnormal   Collection Time: 12/13/17 10:50 AM  Result Value Ref Range   Sodium 137 135 - 145 mmol/L   Potassium 3.9 3.5 - 5.1 mmol/L   Chloride 104 101 - 111 mmol/L   CO2 21 (L) 22 - 32 mmol/L   Glucose, Bld 173 (H) 65 - 99 mg/dL   BUN 23 (H) 6 - 20 mg/dL   Creatinine, Ser 2.951.17 0.61 - 1.24 mg/dL   Calcium 8.6 (L) 8.9 - 10.3 mg/dL   Total Protein 7.3 6.5 - 8.1 g/dL   Albumin 3.3 (L) 3.5 - 5.0 g/dL   AST 31 15 - 41 U/L   ALT 22 17 - 63 U/L   Alkaline Phosphatase 89 38 - 126 U/L   Total Bilirubin 0.8 0.3 - 1.2 mg/dL   GFR calc non Af Amer >60 >60 mL/min    GFR calc Af Amer >60 >60 mL/min   Anion gap 12 5 - 15  CBC     Status: None   Collection Time: 12/13/17 10:50 AM  Result Value Ref Range   WBC 10.1 4.0 - 10.5 K/uL   RBC 4.68 4.22 - 5.81 MIL/uL   Hemoglobin 13.9 13.0 - 17.0 g/dL   HCT 62.142.9 30.839.0 - 65.752.0 %   MCV 91.7 78.0 - 100.0 fL   MCH 29.7 26.0 - 34.0 pg   MCHC 32.4 30.0 - 36.0 g/dL   RDW 84.614.7 96.211.5 - 95.215.5 %   Platelets 173 150 - 400 K/uL  Ethanol     Status: None   Collection Time: 12/13/17 10:50 AM  Result Value Ref Range   Alcohol, Ethyl (B) <10 <10 mg/dL  Protime-INR     Status: None   Collection Time: 12/13/17 10:50 AM  Result Value Ref Range   Prothrombin Time 13.9 11.4 - 15.2 seconds   INR 1.08   Sample to Blood Bank     Status: None   Collection Time: 12/13/17 10:50 AM  Result Value Ref Range   Blood Bank Specimen SAMPLE AVAILABLE FOR TESTING    Sample Expiration      12/14/2017 Performed at [facility], [address] [place], [place] [address]   Hemoglobin A1c     Status: Abnormal   Collection Time: 12/13/17 10:50 AM  Result Value Ref Range   Hgb A1c MFr Bld 6.5 (H) 4.8 - 5.6 %   Mean Plasma Glucose 139.85 mg/dL  I-Stat Chem 8, ED     Status: Abnormal   Collection Time: 12/13/17 10:56 AM  Result Value Ref Range   Sodium 140 135 - 145 mmol/L   Potassium 4.3 3.5 - 5.1 mmol/L   Chloride 104 101 - 111 mmol/L   BUN 31 (H) 6 - 20 mg/dL   Creatinine, Ser 4.401.10 0.61 - 1.24 mg/dL   Glucose, Bld 102175 (H) 65 - 99 mg/dL  Calcium, Ion 1.05 (L) 1.15 - 1.40 mmol/L   TCO2 26 22 - 32 mmol/L   Hemoglobin 15.0 13.0 - 17.0 g/dL   HCT 09.8 11.9 - 14.7 %  I-Stat CG4 Lactic Acid, ED     Status: None   Collection Time: 12/13/17 10:56 AM  Result Value Ref Range   Lactic Acid, Venous 1.76 0.5 - 1.9 mmol/L  CDS serology     Status: None   Collection Time: 12/13/17  2:16 PM  Result Value Ref Range   CDS serology specimen      SPECIMEN WILL BE HELD FOR 14 DAYS IF TESTING IS REQUIRED  MRSA PCR Screening     Status:  None   Collection Time: 12/13/17  2:28 PM  Result Value Ref Range   MRSA by PCR NEGATIVE NEGATIVE  Glucose, capillary     Status: Abnormal   Collection Time: 12/13/17  5:21 PM  Result Value Ref Range   Glucose-Capillary 125 (H) 65 - 99 mg/dL  Glucose, capillary     Status: Abnormal   Collection Time: 12/13/17  9:54 PM  Result Value Ref Range   Glucose-Capillary 190 (H) 65 - 99 mg/dL  Comprehensive metabolic panel     Status: Abnormal   Collection Time: 12/14/17  4:32 AM  Result Value Ref Range   Sodium 134 (L) 135 - 145 mmol/L   Potassium 3.9 3.5 - 5.1 mmol/L   Chloride 105 101 - 111 mmol/L   CO2 20 (L) 22 - 32 mmol/L   Glucose, Bld 158 (H) 65 - 99 mg/dL   BUN 16 6 - 20 mg/dL   Creatinine, Ser 8.29 0.61 - 1.24 mg/dL   Calcium 8.5 (L) 8.9 - 10.3 mg/dL   Total Protein 6.9 6.5 - 8.1 g/dL   Albumin 3.0 (L) 3.5 - 5.0 g/dL   AST 34 15 - 41 U/L   ALT 24 17 - 63 U/L   Alkaline Phosphatase 83 38 - 126 U/L   Total Bilirubin 0.7 0.3 - 1.2 mg/dL   GFR calc non Af Amer >60 >60 mL/min   GFR calc Af Amer >60 >60 mL/min   Anion gap 9 5 - 15  CBC     Status: None   Collection Time: 12/14/17  4:32 AM  Result Value Ref Range   WBC 10.2 4.0 - 10.5 K/uL   RBC 4.56 4.22 - 5.81 MIL/uL   Hemoglobin 13.5 13.0 - 17.0 g/dL   HCT 56.2 13.0 - 86.5 %   MCV 90.4 78.0 - 100.0 fL   MCH 29.6 26.0 - 34.0 pg   MCHC 32.8 30.0 - 36.0 g/dL   RDW 78.4 69.6 - 29.5 %   Platelets 202 150 - 400 K/uL  Glucose, capillary     Status: Abnormal   Collection Time: 12/14/17  7:45 AM  Result Value Ref Range   Glucose-Capillary 152 (H) 65 - 99 mg/dL   Ct Head Wo Contrast  Result Date: 12/13/2017 CLINICAL DATA:  Status post fall in the bathtub today with a blow to the head. Neck pain. Initial encounter. EXAM: CT HEAD WITHOUT CONTRAST CT CERVICAL SPINE WITHOUT CONTRAST TECHNIQUE: Multidetector CT imaging of the head and cervical spine was performed following the standard protocol without intravenous contrast.  Multiplanar CT image reconstructions of the cervical spine were also generated. COMPARISON:  None. FINDINGS: CT HEAD FINDINGS Brain: No evidence of acute infarction, hemorrhage, hydrocephalus, extra-axial collection or mass lesion/mass effect. Vascular: No hyperdense vessel or unexpected calcification. Skull: Intact. Sinuses/Orbits:  Clear. Other: Soft tissue contusion anterior forehead noted. CT CERVICAL SPINE FINDINGS Alignment: Maintained.  Straightening of lordosis noted. Skull base and vertebrae: No acute fracture. No primary bone lesion or focal pathologic process. Soft tissues and spinal canal: No prevertebral fluid or swelling. No visible canal hematoma. Disc levels: Loss of disc space height and endplate spurring are seen at C5-6, C6-7 and to a lesser degree, C7-T1. Upper chest: Lung apices clear. Other: None. IMPRESSION: Soft tissue contusion anterior to the frontal bone without underlying fracture. No acute intracranial abnormality. No acute abnormality cervical spine. Degenerative disc disease most notable at C5-6 and C6-7. Electronically Signed   By: Drusilla Kanner M.D.   On: 12/13/2017 12:35   Ct Cervical Spine Wo Contrast  Result Date: 12/13/2017 CLINICAL DATA:  Status post fall in the bathtub today with a blow to the head. Neck pain. Initial encounter. EXAM: CT HEAD WITHOUT CONTRAST CT CERVICAL SPINE WITHOUT CONTRAST TECHNIQUE: Multidetector CT imaging of the head and cervical spine was performed following the standard protocol without intravenous contrast. Multiplanar CT image reconstructions of the cervical spine were also generated. COMPARISON:  None. FINDINGS: CT HEAD FINDINGS Brain: No evidence of acute infarction, hemorrhage, hydrocephalus, extra-axial collection or mass lesion/mass effect. Vascular: No hyperdense vessel or unexpected calcification. Skull: Intact. Sinuses/Orbits: Clear. Other: Soft tissue contusion anterior forehead noted. CT CERVICAL SPINE FINDINGS Alignment: Maintained.   Straightening of lordosis noted. Skull base and vertebrae: No acute fracture. No primary bone lesion or focal pathologic process. Soft tissues and spinal canal: No prevertebral fluid or swelling. No visible canal hematoma. Disc levels: Loss of disc space height and endplate spurring are seen at C5-6, C6-7 and to a lesser degree, C7-T1. Upper chest: Lung apices clear. Other: None. IMPRESSION: Soft tissue contusion anterior to the frontal bone without underlying fracture. No acute intracranial abnormality. No acute abnormality cervical spine. Degenerative disc disease most notable at C5-6 and C6-7. Electronically Signed   By: Drusilla Kanner M.D.   On: 12/13/2017 12:35   Mr Cervical Spine Wo Contrast  Result Date: 12/13/2017 CLINICAL DATA:  67 y/o M; fall in bathtub. Tingling and weakness in the upper and lower extremities. EXAM: MRI CERVICAL SPINE WITHOUT CONTRAST TECHNIQUE: Multiplanar, multisequence MR imaging of the cervical spine was performed. No intravenous contrast was administered. COMPARISON:  12/13/2017 CT cervical spine. FINDINGS: Alignment: Straightening of cervical lordosis. Vertebrae: No fracture, evidence of discitis, or bone lesion. Cord: Faint increased T2 cord signal in gray matter at the C4 level (series [id number], image 23). No cord hemorrhage, cord expansion, or cord volume loss. Posterior Fossa, vertebral arteries, paraspinal tissues: Several punctate foci of susceptibility are present within the right inferior cerebellar hemisphere, probably hemosiderin deposition from chronic microhemorrhage. Mild prevertebral edema from C1-T2. Disc levels: C2-3: No significant disc displacement, foraminal stenosis, or canal stenosis. Mild facet hypertrophy. C3-4: Small disc bulge with uncovertebral and facet hypertrophy eccentric to the left. Mild left foraminal stenosis. Mild canal stenosis. C4-5: Disc osteophyte complex with bilateral uncovertebral and facet hypertrophy. Moderate bilateral foraminal  stenosis. Mild canal stenosis. Disc contact on the anterior cord. C5-6: Disc osteophyte complex with left-greater-than-right uncovertebral and facet hypertrophy. Moderate right and severe left foraminal stenosis. Moderate canal stenosis with mild cord flattening. C6-7: Disc osteophyte complex with right greater than left uncovertebral and facet hypertrophy. Severe right and moderate left foraminal stenosis. Moderate canal stenosis with mild cord flattening. C7-T1: Small foraminal endplate marginal osteophytes and left-greater-than-right facet hypertrophy. Moderate left foraminal stenosis. No canal stenosis. IMPRESSION: 1.  Mild prevertebral edema, probably related to muscle strain and/or soft tissue injury. No evidence for spinal fracture or ligamentous injury. No malalignment. 2. Increase central cord signal at C4 level may represent a mild cord contusion. Differential includes chronic sequelae of compressive myelopathy or prior infectious/inflammatory process. 3. Cervical spondylosis greatest at the C5-6 and C6-7 levels. 4. Moderate C5-6 and C6-7 canal stenosis. No high-grade canal stenosis. 5. Severe left C5-6 and severe right C6-7 foraminal stenosis. Multilevel mild and moderate foraminal stenosis. These results will be called to the ordering clinician or representative by the Radiologist Assistant, and communication documented in the PACS or zVision Dashboard. Electronically Signed   By: Mitzi Hansen M.D.   On: 12/13/2017 17:35    Assessment/Plan: Diagnosis: C6 spinal cord injury motor and sensory incomplete, left more affected than right 1. Does the need for close, 24 hr/day medical supervision in concert with the patient's rehab needs make it unreasonable for this patient to be served in a less intensive setting? Yes 2. Co-Morbidities requiring supervision/potential complications: CAD, dm2, morbid obesity, neurogenic bowel and bladder 3. Due to bladder management, bowel management, safety,  skin/wound care, disease management, medication administration, pain management and patient education, does the patient require 24 hr/day rehab nursing? Yes 4. Does the patient require coordinated care of a physician, rehab nurse, PT (1-2 hrs/day, 5 days/week) and OT (1-2 hrs/day, 5 days/week) to address physical and functional deficits in the context of the above medical diagnosis(es)? Yes Addressing deficits in the following areas: balance, endurance, locomotion, strength, transferring, bowel/bladder control, bathing, dressing, feeding, grooming, toileting and psychosocial support 5. Can the patient actively participate in an intensive therapy program of at least 3 hrs of therapy per day at least 5 days per week? Yes 6. The potential for patient to make measurable gains while on inpatient rehab is excellent 7. Anticipated functional outcomes upon discharge from inpatient rehab are supervision, min assist and mod assist  with PT, supervision, min assist and mod assist with OT, n/a with SLP. 8. Estimated rehab length of stay to reach the above functional goals is: 20-27 days 9. Anticipated D/C setting: Home 10. Anticipated post D/C treatments: HH therapy 11. Overall Rehab/Functional Prognosis: excellent  RECOMMENDATIONS: This patient's condition is appropriate for continued rehabilitative care in the following setting: CIR Patient has agreed to participate in recommended program. Yes Note that insurance prior authorization may be required for reimbursement for recommended care.  Comment: Rehab Admissions Coordinator to follow up.  Thanks,  Ranelle Oyster, MD, [place] [facility]      Jacquelynn Cree, PA-C 12/14/2017

## 2017-12-14 NOTE — Progress Notes (Signed)
PHARMACIST - PHYSICIAN ORDER COMMUNICATION  CONCERNING: P&T Medication Policy on Herbal Medications  DESCRIPTION:  This patient's order for:  Cinnamon 500 mg, chromium 200 mcg, Krill oil 1000 mg  has been noted.  This product(s) is classified as an "herbal" or natural product. Due to a lack of definitive safety studies or FDA approval, nonstandard manufacturing practices, plus the potential risk of unknown drug-drug interactions while on inpatient medications, the Pharmacy and Therapeutics Committee does not permit the use of "herbal" or natural products of this type within [facility].   ACTION TAKEN: The pharmacy department is unable to verify this order at this time and your patient has been informed of this safety policy. Please reevaluate patient's clinical condition at discharge and address if the herbal or natural product(s) should be resumed at that time.

## 2017-12-14 NOTE — Progress Notes (Signed)
Nutrition Brief Note  Patient identified with low braden score.   Wt Readings from Last 15 Encounters:  12/14/17 (!) 380 lb 8.2 oz (172.6 kg)  09/24/17 (!) 352 lb 12.8 oz (160 kg)  07/28/17 (!) 389 lb (176.4 kg)  02/13/17 (!) 393 lb (178.3 kg)  01/12/17 (!) 395 lb 12.8 oz (179.5 kg)  12/16/16 (!) 369 lb (167.4 kg)  10/09/16 (!) 389 lb 9.6 oz (176.7 kg)  09/26/16 (!) 398 lb (180.5 kg)  09/21/16 (!) 391 lb 4.8 oz (177.5 kg)  08/30/15 (!) 394 lb (178.7 kg)  07/30/15 (!) 376 lb (170.6 kg)  06/06/15 (!) 376 lb (170.6 kg)  05/31/15 (!) 375 lb 14.4 oz (170.5 kg)  02/15/15 (!) 370 lb (167.8 kg)  12/26/14 (!) 370 lb (167.8 kg)    Body mass index is 51.61 kg/m. Patient meets criteria for morbid based on current BMI.   Current diet order is heart healthy, patient is consuming approximately 100% of meals at this time. Pt reports having a good appetite currently and PTA with no other difficulties. Pt reports he has been monitoring and limiting his added sugar and carbohydrates at meals.  Labs and medications reviewed.   No nutrition interventions warranted at this time. If nutrition issues arise, please consult RD.   Roslyn SmilingStephanie Malosi Hemstreet, MS, RD, LDN Pager # [phone] After hours/ weekend pager # [phone]

## 2017-12-14 NOTE — Evaluation (Signed)
Physical Therapy Evaluation Patient Details Name: Franklin Hicks MRN: [id number] DOB: Sep 30, 1950 Today's Date: 12/14/2017   History of Present Illness  This 67 y.o. male admitted after falling and striking head in bathroom while attempting to step into tub.  Cervical MRI showed multi level DDD with moderate canal stenosis C4/5, severe Lt C5/6, and severe Rt stenosis C6/7 with increased cord signal C4.   PMH includes: NSTEMI, obesity, Gout, DM, CAD, systolic and diastolic HF   Clinical Impression  Pt was able to sit EOB with three person assist for the transitional movements.  He has movement in all 4 extremities with right side (arm and leg) stronger than left side.  He has a supportive wife and is very motivated to get better.  He had questions about his prognosis and recovery that we answered as best we could not knowing how he will heal.  He is appropriate for CIR level therapist at discharge.   PT to follow acutely for deficits listed below.       Follow Up Recommendations CIR    Equipment Recommendations  Wheelchair (measurements PT);Wheelchair cushion (measurements PT);Hospital bed;3in1 (PT)(drop arm 3 -in 1, 22x22)    Recommendations for Other Services Rehab consult     Precautions / Restrictions Precautions Precautions: Cervical Precaution Comments: reviewed cervical precautions  Required Braces or Orthoses: Cervical Brace Cervical Brace: Hard collar      Mobility  Bed Mobility Overal bed mobility: Needs Assistance Bed Mobility: Rolling;Sidelying to Sit;Sit to Sidelying Rolling: Total assist;+2 for physical assistance;+2 for safety/equipment Sidelying to sit: Total assist;+2 for physical assistance;+2 for safety/equipment     Sit to sidelying: Max assist;+2 for physical assistance;+2 for safety/equipment General bed mobility comments: 3 assist provided. Requires assist to for all aspects.  He was able to assist with lowering trunk toward bed   Transfers                  General transfer comment: unable to safely attempt and first attempts may need to be with total lift for safety  Ambulation/Gait             General Gait Details: unable at this time.         Balance Overall balance assessment: Needs assistance Sitting-balance support: Feet supported;Single extremity supported Sitting balance-[name]: Poor Sitting balance - Comments: initally min to mod assist to support trunk to prevent posterior LOB, but did progress to supervision and has a tendancy to slowly loose his balance posteriorly and then self correct with trunk and arms proped.                                      Pertinent Vitals/Pain Pain Assessment: Faces Faces Pain Scale: Hurts little more Pain Location: bil. UEs  Pain Descriptors / Indicators: Pins and needles Pain Intervention(s): Limited activity within patient's tolerance;Monitored during session;Repositioned    Home Living Family/patient expects to be discharged to:: Private residence Living Arrangements: Spouse/significant other Available Help at Discharge: Family Type of Home: House Home Access: Stairs to enter   [employer] of Steps: 6(2 in the back to get onto main level wiht bedroom) Home Layout: Multi-level;Able to live on main level with bedroom/bathroom;Laundry or work area in [employer] Equipment: Gilmer Mor - single point;Grab bars - tub/shower Additional Comments: back entrance with 2 steps that would get you to the main level where the kitchen and bed/bath are if it  is not muddy they can pull the car around to the steps.     Prior Function Level of Independence: Needs assistance   Gait / Transfers Assistance Needed: walking with 2 canes all the time  ADL's / Homemaking Assistance Needed: bathed and dressed independently  Comments: drives, retired Programmer, systemseducator (taught PE)     Hand Dominance   Dominant Hand: Right    Extremity/Trunk Assessment   Upper Extremity  Assessment Upper Extremity Assessment: Defer to OT evaluation RUE Deficits / Details: shoulder 3-/5 flexion and abduction; elbow flex/ext 3/5; forearm 3-/5; wrist 3+/5; hand flex/ext 3+/5.  Able to oppose digit 2  RUE Sensation: (intact to light touch ) RUE Coordination: decreased fine motor;decreased gross motor LUE Deficits / Details: shoulder 3-/5 flex and abd; elbow flex 3-/5, ext 3/5, forearm 3-/5; wrist 3/5; hand 1/5 except thumb 2/5  LUE Sensation: (intact LT ) LUE Coordination: decreased fine motor;decreased gross motor    Lower Extremity Assessment Lower Extremity Assessment: RLE deficits/detail;LLE deficits/detail RLE Deficits / Details: right leg is stronger than left leg with ankle 4/5, knee at least 3/5 seated extension and hip flexion at least 3-/5.  RLE Sensation: WNL LLE Deficits / Details: Pt with 2+/5 DF, PF, knee weak quad 2+/5 grossly, hip flexion (unable to lift leg agaist gravity).  At baseline pt has a "bad knee" with limited knee flexion ROM on this side, however, since he fell he is reporting some posterior/lateral knee and tigh pain (he reports he was positioned akwardly with one leg in and one leg out of the tub).  This will need to be monitored to ensure no further follow up is needed.  LLE Sensation: WNL    Cervical / Trunk Assessment Cervical / Trunk Assessment: Other exceptions Cervical / Trunk Exceptions: Pt in cervical collar.     Communication   Communication: No difficulties  Cognition Arousal/Alertness: Awake/alert Behavior During Therapy: WFL for tasks assessed/performed Overall Cognitive Status: Within Functional Limits for tasks assessed                                 General Comments: No apparent cognitive deficits noted       General Comments General comments (skin integrity, edema, etc.): VSS throughout session, no reports of lightheadedness.     Exercises Other Exercises Other Exercises: Pt instructed to work on Lt hand  flexion/ext and bil shoulder flexion    Assessment/Plan    PT Assessment Patient needs continued PT services  PT Problem List Decreased strength;Decreased activity tolerance;Decreased balance;Decreased mobility;Decreased coordination;Decreased knowledge of use of DME;Decreased knowledge of precautions;Impaired sensation;Obesity;Pain       PT Treatment Interventions DME instruction;Functional mobility training;Therapeutic activities;Therapeutic exercise;Balance training;Neuromuscular re-education;Patient/family education;Wheelchair mobility training;Gait training;Stair training    PT Goals (Current goals can be found in the Care Plan section)  Acute Rehab PT Goals Patient Stated Goal: to get back to normal  PT Goal Formulation: With patient/family Time For Goal Achievement: 12/28/17 Potential to Achieve Goals: Good    Frequency Min 4X/week   Barriers to discharge Inaccessible home environment Has at the least 2 STE and split level home    Co-evaluation PT/OT/SLP Co-Evaluation/Treatment: Yes Reason for Co-Treatment: Complexity of the patient's impairments (multi-system involvement);For patient/therapist safety;To address functional/ADL transfers PT goals addressed during session: Mobility/safety with mobility;Balance;Strengthening/ROM OT goals addressed during session: ADL's and self-care;Strengthening/ROM       AM-PAC PT "6 Clicks" Daily Activity  Outcome Measure Difficulty turning  over in bed (including adjusting bedclothes, sheets and blankets)?: Unable Difficulty moving from lying on back to sitting on the side of the bed? : Unable Difficulty sitting down on and standing up from a chair with arms (e.g., wheelchair, bedside commode, etc,.)?: Unable Help needed moving to and from a bed to chair (including a wheelchair)?: Total Help needed walking in hospital room?: Total Help needed climbing 3-5 steps with a railing? : Total 6 Click Score: 6    End of Session Equipment  Utilized During Treatment: Cervical collar Activity Tolerance: Patient limited by pain;Patient limited by fatigue Patient left: in bed;with call bell/phone within reach;with family/visitor present   PT Visit Diagnosis: Muscle weakness (generalized) (M62.81);Other symptoms and signs involving the nervous system (R29.898);Pain Pain - Right/Left: (bil) Pain - part of body: Arm(arms)    Time: 9604-5409 PT Time Calculation (min) (ACUTE ONLY): 63 min   Charges:       Lurena Joiner B. Toneshia Coello, PT, DPT [id number]     PT Evaluation $PT Eval Moderate Complexity: 1 Mod PT Treatments $Therapeutic Activity: 8-22 mins   12/14/2017, 3:55 PM

## 2017-12-14 NOTE — Progress Notes (Signed)
[facility] TEAM 1 - Stepdown/ICU TEAM  Franklin Hicks  ZOX:[id number] DOB: Jan 18, 1951 DOA: 12/13/2017 PCP: Shelva Majestic, MD    Brief Narrative:  67yo M w/ a hx of obesity, systolic and diastolic CHF, DM, Gout, HTN, and CAD on plavix (DES in January of 2018) who presented after a fall in his bathtub striking his forehead resulting in lingering B UE severe weakness and discoordination.    Significant Events: 4/21 admit  Subjective: No new complaints.  Feels he is regaining some strength in his arms.  Denies cp, sob, n/v, or abdom pain.    Assessment & Plan:  Mechanical fall / central cord injury  No acute surgical need, though will likely require ACDF C45, C56, C67 eventually - for now cont w/ PT/OT - hopeful for CIR stay - care as per NS  Chronic combined systolic and diastolic heart failure TTE Feb 2018 noted EF 45% w/ grade 2 diastolic dysfunction - baseline wgt varies between 168 and 178kg - on minimal dose of lasix TIW as outpt - resume usual home meds and follow   Filed Weights   12/13/17 1018 12/13/17 1418 12/14/17 0322  Weight: (!) 170.1 kg (375 lb) (!) 171.5 kg (378 lb 1.4 oz) (!) 172.6 kg (380 lb 8.2 oz)    CAD s/p DES ramus intermediius Jan 2018 Resume Plavix - no acute sx   Diabetes diet controlled as outpt - CBG climbing on steroid - follow and titrate insulin prn   HTN Reasonably controlled   Morbid obesity - Body mass index is 51.61 kg/m.   Gout   DVT prophylaxis: SQ heparin  Code Status: FULL CODE Family Communication: spoke w/ wife at bedside   Disposition Plan: hopeful for CIR transfer   Consultants:  Neurosurgery   Antimicrobials:  none  Objective: Blood pressure (!) 146/66, pulse 87, temperature 98.4 F (36.9 C), temperature source Oral, resp. rate 15, height 6' (1.829 m), weight (!) 172.6 kg (380 lb 8.2 oz), SpO2 96 %.  Intake/Output Summary (Last 24 hours) at 12/14/2017 1509 Last data filed at 12/14/2017 1200 Gross per 24 hour  Intake  1807.5 ml  Output 550 ml  Net 1257.5 ml   Filed Weights   12/13/17 1018 12/13/17 1418 12/14/17 0322  Weight: (!) 170.1 kg (375 lb) (!) 171.5 kg (378 lb 1.4 oz) (!) 172.6 kg (380 lb 8.2 oz)    Examination: General: No acute respiratory distress Lungs: Clear to auscultation bilaterally without wheezes or crackles Cardiovascular: Regular rate and rhythm without murmur gallop or rub normal S1 and S2 Abdomen: Nontender, nondistended, soft, bowel sounds positive, no rebound, no ascites, no appreciable mass Extremities: No significant cyanosis, clubbing, or edema bilateral lower extremities  CBC: Recent Labs  Lab 12/13/17 1050 12/13/17 1056 12/14/17 0432  WBC 10.1  --  10.2  HGB 13.9 15.0 13.5  HCT 42.9 44.0 41.2  MCV 91.7  --  90.4  PLT 173  --  202   Basic Metabolic Panel: Recent Labs  Lab 12/13/17 1050 12/13/17 1056 12/14/17 0432  NA 137 140 134*  K 3.9 4.3 3.9  CL 104 104 105  CO2 21*  --  20*  GLUCOSE 173* 175* 158*  BUN 23* 31* 16  CREATININE 1.17 1.10 0.92  CALCIUM 8.6*  --  8.5*   GFR: Estimated Creatinine Clearance: 125.7 mL/min (by C-G formula based on SCr of 0.92 mg/dL).  Liver Function Tests: Recent Labs  Lab 12/13/17 1050 12/14/17 0432  AST 31 34  ALT 22 24  ALKPHOS 89 83  BILITOT 0.8 0.7  PROT 7.3 6.9  ALBUMIN 3.3* 3.0*    Coagulation Profile: Recent Labs  Lab 12/13/17 1050  INR 1.08   HbA1C: Hgb A1c MFr Bld  Date/Time Value Ref Range Status  12/13/2017 10:50 AM 6.5 (H) 4.8 - 5.6 % Final    Comment:    (NOTE) Pre diabetes:          5.7%-6.4% Diabetes:              >6.4% Glycemic control for   <7.0% adults with diabetes   09/24/2017 03:04 PM 6.8 (H) 4.6 - 6.5 % Final    Comment:    Glycemic Control Guidelines for People with Diabetes:Non Diabetic:  <6%Goal of Therapy: <7%Additional Action Suggested:  >8%     CBG: Recent Labs  Lab 12/13/17 1721 12/13/17 2154 12/14/17 0745 12/14/17 1213  GLUCAP 125* 190* 152* 182*     Recent Results (from the past 240 hour(s))  MRSA PCR Screening     Status: None   Collection Time: 12/13/17  2:28 PM  Result Value Ref Range Status   MRSA by PCR NEGATIVE NEGATIVE Final    Comment:        The GeneXpert MRSA Assay (FDA approved for NASAL specimens only), is one component of a comprehensive MRSA colonization surveillance program. It is not intended to diagnose MRSA infection nor to guide or monitor treatment for MRSA infections. Performed at [facility], [address] [place], [place] [address]      Scheduled Meds: . atorvastatin  80 mg Oral q1800  . dexamethasone  4 mg Intravenous Q8H  . docusate sodium  100 mg Oral BID  . insulin aspart  0-5 Units Subcutaneous QHS  . insulin aspart  0-9 Units Subcutaneous TID WC  . mouth rinse  15 mL Mouth Rinse BID  . metoprolol tartrate  50 mg Oral BID  . terazosin  10 mg Oral QHS     LOS: 1 day   Lonia BloodJeffrey T. Laycie Schriner, MD [facility] Office  [phone] Pager - Text Page per Loretha StaplerAmion as per below:  On-Call/Text Page:      [email or web address]      password [email or web address]  If 7PM-7AM, please contact night-coverage [email or web address] Password [email or web address] 12/14/2017, 3:09 PM

## 2017-12-15 ENCOUNTER — Inpatient Hospital Stay (HOSPITAL_COMMUNITY): Payer: Medicare Other

## 2017-12-15 LAB — D-DIMER, QUANTITATIVE: D-Dimer, Quant: 12.59 ug/mL-FEU — ABNORMAL HIGH (ref 0.00–0.50)

## 2017-12-15 LAB — GLUCOSE, CAPILLARY
GLUCOSE-CAPILLARY: 146 mg/dL — AB (ref 65–99)
GLUCOSE-CAPILLARY: 131 mg/dL — AB (ref 65–99)
GLUCOSE-CAPILLARY: 121 mg/dL — AB (ref 65–99)
Glucose-Capillary: 147 mg/dL — ABNORMAL HIGH (ref 65–99)

## 2017-12-15 MED ORDER — HEPARIN BOLUS VIA INFUSION
6000.0000 [IU] | Freq: Once | INTRAVENOUS | Status: AC
  Administered 2017-12-15: 6000 [IU] via INTRAVENOUS
  Filled 2017-12-15: qty 6000

## 2017-12-15 MED ORDER — HEPARIN (PORCINE) IN NACL 100-0.45 UNIT/ML-% IJ SOLN
1600.0000 [IU]/h | INTRAMUSCULAR | Status: DC
  Administered 2017-12-15 – 2017-12-16 (×3): 2000 [IU]/h via INTRAVENOUS
  Administered 2017-12-17: 1900 [IU]/h via INTRAVENOUS
  Filled 2017-12-15 (×4): qty 250

## 2017-12-15 MED ORDER — IOPAMIDOL (ISOVUE-370) INJECTION 76%
INTRAVENOUS | Status: AC
  Filled 2017-12-15: qty 100

## 2017-12-15 MED ORDER — IOPAMIDOL (ISOVUE-370) INJECTION 76%
100.0000 mL | Freq: Once | INTRAVENOUS | Status: AC | PRN
  Administered 2017-12-15: 100 mL via INTRAVENOUS

## 2017-12-15 NOTE — Progress Notes (Signed)
Occupational Therapy Treatment Patient Details Name: Franklin Hicks MRN: [id number] DOB: 02/15/1951 Today's Date: 12/15/2017    History of present illness This 67 y.o. male admitted after falling and striking head in bathroom while attempting to step into tub.  Cervical MRI showed multi level DDD with moderate canal stenosis C4/5, severe Lt C5/6, and severe Rt stenosis C6/7 with increased cord signal C4.   PMH includes: NSTEMI, obesity, Gout, DM, CAD, systolic and diastolic HF    OT comments  Pt seen with PT.  He demonstrates increased LE movement as well as beginning flexion and extension of digits of Lt hand.  He was able to move to EOB with mod A today.  He required 2+ max A to move sit to stand and to transfer to recliner via Stedy.   He tolerated activity very well and is very motivated to regain independence.  Continue to recommend CIR.   Follow Up Recommendations  CIR;Supervision/Assistance - 24 hour    Equipment Recommendations  None recommended by OT    Recommendations for Other Services Rehab consult    Precautions / Restrictions Precautions Precautions: Cervical Required Braces or Orthoses: Cervical Brace Cervical Brace: Hard collar       Mobility Bed Mobility Overal bed mobility: Needs Assistance Bed Mobility: Rolling;Sidelying to Sit Rolling: Mod assist Sidelying to sit: Mod assist       General bed mobility comments: Asssit to flex Lt knee, and assist to roll to Lt, requires min a to scoot Lt LE off EOB and assist to lift trunk   Transfers Overall transfer level: Needs assistance Equipment used: Ambulation equipment used Transfers: Sit to/from BJ'sStand;Stand Pivot Transfers Sit to Stand: Max assist;+2 physical assistance;+2 safety/equipment Stand pivot transfers: Max assist;+2 physical assistance;+2 safety/equipment       General transfer comment: assist to lift buttocks, extend hips, and extend trunk.  He requires assist to control descent into seated position.   Pt with difficulty fully flexing Lt knee, and required assist to extend Lt knee while moving stand > sit     Balance Overall balance assessment: Needs assistance Sitting-balance support: Feet supported;Single extremity supported Sitting balance-[name]: Fair Sitting balance - Comments: able to maintain EOB sitting with supervision.  Able to reciprocally scoot hips to EOB    Standing balance support: Bilateral upper extremity supported Standing balance-[name]: Poor Standing balance comment: utilizing stedy and bed elevated, he was able to stand x 2 with assist to lift buttocks and to extend hips and trunk                             ADL either performed or assessed with clinical judgement   ADL                           Toilet Transfer: Maximal assistance;+2 for physical assistance;Stand-pivot;BSC Toilet Transfer Details (indicate cue type and reason): utilized stedy for transfer          Functional mobility during ADLs: Maximal assistance;+2 for physical assistance       Vision       Perception     Praxis      Cognition Arousal/Alertness: Awake/alert Behavior During Therapy: WFL for tasks assessed/performed Overall Cognitive Status: Within Functional Limits for tasks assessed  Exercises Other Exercises Other Exercises: Pt now demonstrates beginning flexion and extension of Lt hand    Shoulder Instructions       General Comments VSS     Pertinent Vitals/ Pain       Pain Assessment: Faces Faces Pain Scale: Hurts little more Pain Location: Lt knee  Pain Descriptors / Indicators: Aching Pain Intervention(s): Monitored during session;Repositioned  Home Living                                          Prior Functioning/Environment              Frequency  Min 2X/week        Progress Toward Goals  OT Goals(current goals can now be found in the  care plan section)  Progress towards OT goals: Progressing toward goals     Plan Discharge plan remains appropriate    Co-evaluation    PT/OT/SLP Co-Evaluation/Treatment: Yes Reason for Co-Treatment: Complexity of the patient's impairments (multi-system involvement);For patient/therapist safety   OT goals addressed during session: Strengthening/ROM;ADL's and self-care      AM-PAC PT "6 Clicks" Daily Activity     Outcome Measure   Help from another person eating meals?: A Lot Help from another person taking care of personal grooming?: A Lot Help from another person toileting, which includes using toliet, bedpan, or urinal?: A Lot Help from another person bathing (including washing, rinsing, drying)?: A Lot Help from another person to put on and taking off regular upper body clothing?: Total Help from another person to put on and taking off regular lower body clothing?: Total 6 Click Score: 10    End of Session Equipment Utilized During Treatment: Cervical collar  OT Visit Diagnosis: Muscle weakness (generalized) (M62.81)   Activity Tolerance Patient tolerated treatment well   Patient Left in chair;with call bell/phone within reach;with family/visitor present   Nurse Communication Mobility status;Need for lift equipment        Time: 1315-1353 OT Time Calculation (min): 38 min  Charges: OT General Charges $OT Visit: 1 Visit OT Treatments $Neuromuscular Re-education: 23-37 mins  [employer], OTR/L [phone]    Jeani Hawking M 12/15/2017, 4:14 PM

## 2017-12-15 NOTE — Progress Notes (Signed)
Physical Therapy Treatment Patient Details Name: Franklin Hicks MRN: [id number] DOB: 01-10-1951 Today's Date: 12/15/2017    History of Present Illness This 67 y.o. male admitted after falling and striking head in bathroom while attempting to step into tub.  Cervical MRI showed multi level DDD with moderate canal stenosis C4/5, severe Lt C5/6, and severe Rt stenosis C6/7 with increased cord signal C4.   PMH includes: NSTEMI, obesity, Gout, DM, CAD, systolic and diastolic HF     PT Comments    Pt is much improved over his level of mobility on evaluation.  Noted improvements in all 4 extremity function and truncal stability.  Emphasis on transition to EOB, static/dynamic sitting balance, standing in the STEDY.   Follow Up Recommendations  CIR     Equipment Recommendations  Wheelchair (measurements PT);Wheelchair cushion (measurements PT);Hospital bed;3in1 (PT)    Recommendations for Other Services Rehab consult     Precautions / Restrictions Precautions Precautions: Cervical Required Braces or Orthoses: Cervical Brace Cervical Brace: Hard collar    Mobility  Bed Mobility Overal bed mobility: Needs Assistance Bed Mobility: Rolling;Sidelying to Sit Rolling: Mod assist Sidelying to sit: Mod assist       General bed mobility comments: Asssit to flex Lt knee, and assist to roll to Lt, requires min a to scoot Lt LE off EOB and assist to lift trunk   Transfers Overall transfer level: Needs assistance Equipment used: Ambulation equipment used Transfers: Sit to/from [employer] Transfers Sit to Stand: Max assist;+2 physical assistance;+2 safety/equipment Stand pivot transfers: Max assist;+2 physical assistance;+2 safety/equipment       General transfer comment: assist to lift buttocks, extend hips, and extend trunk.  He requires assist to control descent into seated position.  Pt with difficulty fully flexing Lt knee, and required assist to extend Lt knee while moving stand >  sit   Ambulation/Gait             General Gait Details: unable at this time.    Stairs             Wheelchair Mobility    Modified Rankin (Stroke Patients Only)       Balance Overall balance assessment: Needs assistance Sitting-balance support: Feet supported;Single extremity supported Sitting balance-[name]: Fair Sitting balance - Comments: able to maintain EOB sitting with supervision.  Able to reciprocally scoot hips to EOB    Standing balance support: Bilateral upper extremity supported Standing balance-[name]: Poor Standing balance comment: utilizing stedy and bed elevated, he was able to stand x 2 with assist to lift buttocks and to extend hips and trunk                              Cognition Arousal/Alertness: Awake/alert Behavior During Therapy: WFL for tasks assessed/performed Overall Cognitive Status: Within Functional Limits for tasks assessed                                        Exercises Other Exercises Other Exercises: Pt now demonstrates beginning flexion and extension of Lt hand     General Comments General comments (skin integrity, edema, etc.): vss        Pertinent Vitals/Pain Pain Assessment: Faces Faces Pain Scale: Hurts little more Pain Location: Lt knee  Pain Descriptors / Indicators: Aching Pain Intervention(s): Monitored during session;Repositioned    Home Living  Prior Function            PT Goals (current goals can now be found in the care plan section) Acute Rehab PT Goals PT Goal Formulation: With patient/family Time For Goal Achievement: 12/28/17 Potential to Achieve Goals: Good Progress towards PT goals: Progressing toward goals    Frequency    Min 4X/week      PT Plan Current plan remains appropriate    Co-evaluation PT/OT/SLP Co-Evaluation/Treatment: Yes Reason for Co-Treatment: Complexity of the patient's impairments (multi-system  involvement) PT goals addressed during session: Mobility/safety with mobility;Strengthening/ROM OT goals addressed during session: Strengthening/ROM;ADL's and self-care      AM-PAC PT "6 Clicks" Daily Activity  Outcome Measure  Difficulty turning over in bed (including adjusting bedclothes, sheets and blankets)?: Unable Difficulty moving from lying on back to sitting on the side of the bed? : Unable Difficulty sitting down on and standing up from a chair with arms (e.g., wheelchair, bedside commode, etc,.)?: Unable Help needed moving to and from a bed to chair (including a wheelchair)?: Total Help needed walking in hospital room?: Total Help needed climbing 3-5 steps with a railing? : Total 6 Click Score: 6    End of Session Equipment Utilized During Treatment: Cervical collar Activity Tolerance: Patient limited by fatigue;Patient tolerated treatment well Patient left: in chair;with call bell/phone within reach;with family/visitor present Nurse Communication: Mobility status;Need for lift equipment PT Visit Diagnosis: Muscle weakness (generalized) (M62.81);Other symptoms and signs involving the nervous system (R29.898);Pain     Time: 1315-1356 PT Time Calculation (min) (ACUTE ONLY): 41 min  Charges:  $Therapeutic Activity: 8-22 mins                    G Codes:       12/15/2017  Candelaria BingKen Kohlton Gilpatrick, PT [phone]  (pager)   Eliseo GumKenneth V Jessilynn Taft 12/15/2017, 5:46 PM

## 2017-12-15 NOTE — Progress Notes (Signed)
Physical Therapy Treatment Patient Details Name: Franklin SpareCarl J Russum MRN: [id number] DOB: 05/11/1951 Today's Date: 12/15/2017    History of Present Illness This 67 y.o. male admitted after falling and striking head in bathroom while attempting to step into tub.  Cervical MRI showed multi level DDD with moderate canal stenosis C4/5, severe Lt C5/6, and severe Rt stenosis C6/7 with increased cord signal C4.   PMH includes: NSTEMI, obesity, Gout, DM, CAD, systolic and diastolic HF     PT Comments    Challenging return to the bed via maxi-move lift, made difficult by pt's size in relation to the pad and the fact that his left knee had limited ROM.  Though managed lift successfully without incident.   Follow Up Recommendations  CIR     Equipment Recommendations  Wheelchair (measurements PT);Wheelchair cushion (measurements PT);Hospital bed;3in1 (PT)    Recommendations for Other Services Rehab consult     Precautions / Restrictions Precautions Precautions: Cervical Required Braces or Orthoses: Cervical Brace Cervical Brace: Hard collar    Mobility  Bed Mobility Overal bed mobility: Needs Assistance Bed Mobility: Rolling Rolling: Mod assist;+2 for physical assistance Sidelying to sit: Mod assist       General bed mobility comments: rolling to get lift sling out and padding placed  Transfers Overall transfer level: Needs assistance Equipment used: Ambulation equipment used Transfers: Sit to/from Stand;Stand Pivot Transfers Sit to Stand: Max assist;+2 physical assistance;+2 safety/equipment Stand pivot transfers: Max assist;+2 physical assistance;+2 safety/equipment       General transfer comment: used maximove to transfer pt back to the bed.  pt's size and lack of left knee bend presented a challenge, but was successfully completed without incident  Ambulation/Gait             General Gait Details: unable at this time.    Stairs             Wheelchair Mobility     Modified Rankin (Stroke Patients Only)       Balance Overall balance assessment: Needs assistance Sitting-balance support: Feet supported;Single extremity supported;Bilateral upper extremity supported Sitting balance-[name]: Fair Sitting balance - Comments: able to maintain EOB sitting with supervision.  Able to reciprocally scoot hips to EOB    Standing balance support: Bilateral upper extremity supported Standing balance-[name]: Poor Standing balance comment: utilizing stedy and bed elevated, he was able to stand x 2 with assist to lift buttocks and to extend hips and trunk                              Cognition Arousal/Alertness: Awake/alert Behavior During Therapy: WFL for tasks assessed/performed Overall Cognitive Status: Within Functional Limits for tasks assessed                                        Exercises Other Exercises Other Exercises: Pt now demonstrates beginning flexion and extension of Lt hand     General Comments General comments (skin integrity, edema, etc.): family present for the lift to the bed.      Pertinent Vitals/Pain Pain Assessment: Faces Faces Pain Scale: Hurts little more Pain Location: Lt knee  Pain Descriptors / Indicators: Aching Pain Intervention(s): Monitored during session    Home Living  Prior Function            PT Goals (current goals can now be found in the care plan section) Acute Rehab PT Goals PT Goal Formulation: With patient/family Time For Goal Achievement: 12/28/17 Potential to Achieve Goals: Good Progress towards PT goals: Progressing toward goals    Frequency    Min 4X/week      PT Plan Current plan remains appropriate    Co-evaluation PT/OT/SLP Co-Evaluation/Treatment: Yes Reason for Co-Treatment: Complexity of the patient's impairments (multi-system involvement) PT goals addressed during session: Mobility/safety with  mobility;Strengthening/ROM OT goals addressed during session: Strengthening/ROM;ADL's and self-care      AM-PAC PT "6 Clicks" Daily Activity  Outcome Measure  Difficulty turning over in bed (including adjusting bedclothes, sheets and blankets)?: Unable Difficulty moving from lying on back to sitting on the side of the bed? : Unable Difficulty sitting down on and standing up from a chair with arms (e.g., wheelchair, bedside commode, etc,.)?: Unable Help needed moving to and from a bed to chair (including a wheelchair)?: Total Help needed walking in hospital room?: Total Help needed climbing 3-5 steps with a railing? : Total 6 Click Score: 6    End of Session Equipment Utilized During Treatment: Cervical collar Activity Tolerance: Patient tolerated treatment well Patient left: in bed;with call bell/phone within reach;with bed alarm set;with nursing/sitter in room Nurse Communication: Mobility status;Need for lift equipment PT Visit Diagnosis: Muscle weakness (generalized) (M62.81);Other symptoms and signs involving the nervous system (R29.898);Pain Pain - part of body: Knee     Time: 0981-1914 PT Time Calculation (min) (ACUTE ONLY): 14 min  Charges:  $Therapeutic Activity: 8-22 mins                    G Codes:       12/22/2017   Bing, PT [phone]  (pager)   Eliseo Gum Demitris Pokorny Dec 22, 2017, 6:00 PM

## 2017-12-15 NOTE — Progress Notes (Addendum)
[facility] TEAM 1 - Stepdown/ICU TEAM  TINA TEMME  WUJ:[id number] DOB: 1951/04/29 DOA: 12/13/2017 PCP: Shelva Majestic, MD    Brief Narrative:  67yo M w/ a hx of obesity, systolic and diastolic CHF, DM, Gout, HTN, and CAD on plavix (DES in January of 2018) who presented after a fall in his bathtub striking his forehead resulting in lingering B UE severe weakness and discoordination.    Significant Events: 4/21 admit  Subjective: Pt reported some L sided pleuritic chest wall pain today.  He denies signif SOB, N/V, or abdom pain.  He feels that his UE strength continues to slowly improve.    Assessment & Plan:  Mechanical fall / central cord injury  No acute surgical need, though will likely require ACDF C45, C56, C67 eventually - for now cont w/ PT/OT - hopeful for CIR stay - care as per NS  Pleuritic chest pain Given risk factors will evaluate for PE - begin w/ D-dimer and if + will proceed w/ CTa chest - given recent significant fall/trauma (and ongoing use of Plavix) will not start therapeutic anticoag unless definitive evidence of an acutual PE is found   Chronic combined systolic and diastolic heart failure TTE Feb 2018 noted EF 45% w/ grade 2 diastolic dysfunction - baseline wgt varies between 168 and 178kg - on minimal dose of lasix TIW as outpt - resume usual home meds and follow   Filed Weights   12/13/17 1418 12/14/17 0322 12/15/17 0500  Weight: (!) 171.5 kg (378 lb 1.4 oz) (!) 172.6 kg (380 lb 8.2 oz) (!) 173.1 kg (381 lb 9.9 oz)    CAD s/p DES ramus intermediius Jan 2018 Resumed Plavix - no acute sx   Diabetes diet controlled as outpt - CBG well controlled at this time despite decadron - follow w/o change today   HTN BP controlled - follow trend   Morbid obesity - Body mass index is 51.76 kg/m.   Gout   DVT prophylaxis: SQ heparin  Code Status: FULL CODE Family Communication: spoke w/ wife at bedside again today Disposition Plan: awaiting CIR bed    Consultants:  Neurosurgery   Antimicrobials:  none  Objective: Blood pressure (!) 167/88, pulse 91, temperature 98 F (36.7 C), resp. rate 14, height 6' (1.829 m), weight (!) 173.1 kg (381 lb 9.9 oz), SpO2 96 %.  Intake/Output Summary (Last 24 hours) at 12/15/2017 1530 Last data filed at 12/15/2017 0800 Gross per 24 hour  Intake 680 ml  Output 1800 ml  Net -1120 ml   Filed Weights   12/13/17 1418 12/14/17 0322 12/15/17 0500  Weight: (!) 171.5 kg (378 lb 1.4 oz) (!) 172.6 kg (380 lb 8.2 oz) (!) 173.1 kg (381 lb 9.9 oz)    Examination: General: No acute respiratory distress Lungs: Clear to auscultation bilaterally w/o wheezing  Cardiovascular: RRR w/o M  Abdomen: Nontender, nondistended, soft, bowel sounds positive, no rebound, obese Extremities: trace B LE edema   CBC: Recent Labs  Lab 12/13/17 1050 12/13/17 1056 12/14/17 0432  WBC 10.1  --  10.2  HGB 13.9 15.0 13.5  HCT 42.9 44.0 41.2  MCV 91.7  --  90.4  PLT 173  --  202   Basic Metabolic Panel: Recent Labs  Lab 12/13/17 1050 12/13/17 1056 12/14/17 0432  NA 137 140 134*  K 3.9 4.3 3.9  CL 104 104 105  CO2 21*  --  20*  GLUCOSE 173* 175* 158*  BUN 23* 31* 16  CREATININE 1.17 1.10 0.92  CALCIUM 8.6*  --  8.5*   GFR: Estimated Creatinine Clearance: 125.9 mL/min (by C-G formula based on SCr of 0.92 mg/dL).  Liver Function Tests: Recent Labs  Lab 12/13/17 1050 12/14/17 0432  AST 31 34  ALT 22 24  ALKPHOS 89 83  BILITOT 0.8 0.7  PROT 7.3 6.9  ALBUMIN 3.3* 3.0*    Coagulation Profile: Recent Labs  Lab 12/13/17 1050  INR 1.08   HbA1C: Hgb A1c MFr Bld  Date/Time Value Ref Range Status  12/13/2017 10:50 AM 6.5 (H) 4.8 - 5.6 % Final    Comment:    (NOTE) Pre diabetes:          5.7%-6.4% Diabetes:              >6.4% Glycemic control for   <7.0% adults with diabetes   09/24/2017 03:04 PM 6.8 (H) 4.6 - 6.5 % Final    Comment:    Glycemic Control Guidelines for People with Diabetes:Non  Diabetic:  <6%Goal of Therapy: <7%Additional Action Suggested:  >8%     CBG: Recent Labs  Lab 12/14/17 1213 12/14/17 1712 12/14/17 2032 12/15/17 0800 12/15/17 1223  GLUCAP 182* 145* 143* 147* 146*    Recent Results (from the past 240 hour(s))  MRSA PCR Screening     Status: None   Collection Time: 12/13/17  2:28 PM  Result Value Ref Range Status   MRSA by PCR NEGATIVE NEGATIVE Final    Comment:        The GeneXpert MRSA Assay (FDA approved for NASAL specimens only), is one component of a comprehensive MRSA colonization surveillance program. It is not intended to diagnose MRSA infection nor to guide or monitor treatment for MRSA infections. Performed at [facility], [address] [place], [place] [address]      Scheduled Meds: . atorvastatin  80 mg Oral q1800  . clopidogrel  75 mg Oral Daily  . docusate sodium  100 mg Oral BID  . furosemide  20 mg Oral Q M,W,F  . heparin injection (subcutaneous)  5,000 Units Subcutaneous Q8H  . insulin aspart  0-15 Units Subcutaneous TID WC  . insulin aspart  0-5 Units Subcutaneous QHS  . mouth rinse  15 mL Mouth Rinse BID  . metoprolol tartrate  50 mg Oral BID  . multivitamin with minerals  1 tablet Oral Daily  . potassium citrate  10 mEq Oral TID  . terazosin  10 mg Oral QHS  . vitamin B-12  100 mcg Oral Daily     LOS: 2 days   Lonia BloodJeffrey T. Silvana Holecek, MD [facility] Office  [phone] Pager - Text Page per Amion as per below:  On-Call/Text Page:      [email or web address]      password [email or web address]  If 7PM-7AM, please contact night-coverage [email or web address] Password [email or web address] 12/15/2017, 3:30 PM

## 2017-12-15 NOTE — Progress Notes (Signed)
Occupational Therapy Progress Note  Pt provided with adaptive equipment to increase independence with self feeding.  He is able to drink from cup with min A and self feed with mod A.   Recommend CIR.     12/15/17 1616  OT Visit Information  Last OT Received On 12/15/17  Assistance Needed +3 or more  History of Present Illness This 67 y.o. male admitted after falling and striking head in bathroom while attempting to step into tub.  Cervical MRI showed multi level DDD with moderate canal stenosis C4/5, severe Lt C5/6, and severe Rt stenosis C6/7 with increased cord signal C4.   PMH includes: NSTEMI, obesity, Gout, DM, CAD, systolic and diastolic HF   Precautions  Precautions Cervical  Required Braces or Orthoses Cervical Brace  Cervical Brace Hard collar  Pain Assessment  Pain Assessment Faces  Faces Pain Scale 0  Pain Location Lt knee   Cognition  Arousal/Alertness Awake/alert  Behavior During Therapy WFL for tasks assessed/performed  Overall Cognitive Status Within Functional Limits for tasks assessed  ADL  Overall ADL's  Needs assistance/impaired  Eating/Feeding Moderate assistance;With adaptive utensils;Sitting  Eating/Feeding Details (indicate cue type and reason) Pt provided with foam to build up handles of utensils as well as a lidded mug.  He was able to pick up and drink from cup with supervision, but requires min A to release mug.  He requires mod A to self feed with utensils at this time   OT - End of Session  Equipment Utilized During Treatment Cervical collar  Activity Tolerance Patient tolerated treatment well  Patient left in chair;with call bell/phone within reach;with family/visitor present  Nurse Communication Mobility status;Need for lift equipment  OT Assessment/Plan  OT Plan Discharge plan remains appropriate  OT Visit Diagnosis Muscle weakness (generalized) (M62.81)  OT Frequency (ACUTE ONLY) Min 2X/week  Recommendations for Other Services Rehab consult   Follow Up Recommendations CIR;Supervision/Assistance - 24 hour  OT Equipment None recommended by OT  AM-PAC OT "6 Clicks" Daily Activity Outcome Measure  Help from another person eating meals? 2  Help from another person taking care of personal grooming? 2  Help from another person toileting, which includes using toliet, bedpan, or urinal? 2  Help from another person bathing (including washing, rinsing, drying)? 2  Help from another person to put on and taking off regular upper body clothing? 1  Help from another person to put on and taking off regular lower body clothing? 1  6 Click Score 10  ADL G Code Conversion CL  OT Time Calculation  OT Start Time (ACUTE ONLY) 1400  OT Stop Time (ACUTE ONLY) 1408  OT Time Calculation (min) 8 min  OT General Charges  $OT Visit 1 Visit  OT Treatments  $Self Care/Home Management  8-22 mins  Reyn[employer] AmericanWendi Grayer Sproles, OTR/L [phone]

## 2017-12-15 NOTE — Progress Notes (Signed)
Rehab admissions - I met with patient and his daughter.  We then spoke to his wife over speaker phone.  I explained inpatient rehab and I gave them rehab booklets.  I answered a lot of questions about his SCI and I encouraged him to speak with his doctor.  They would like inpatient rehab admission.  I will open the case with [facility] medicare and I will begin the pre-authorization process.  I will follow up again tomorrow.  Call me for questions.  #[phone]

## 2017-12-15 NOTE — Progress Notes (Signed)
ANTICOAGULATION CONSULT NOTE - Initial Consult  Pharmacy Consult for Heparin Indication: pulmonary embolus - New  Allergies  Allergen Reactions  . Ace Inhibitors     Angioedema  . Other Hives and Itching    msg    Patient Measurements: Height: 6' (182.9 cm) Weight: (!) 381 lb 9.9 oz (173.1 kg) IBW/kg (Calculated) : 77.6 Heparin Dosing Weight: 123  Vital Signs: Temp: 97.8 F (36.6 C) (04/23 1948) Temp Source: Oral (04/23 1948) BP: 158/70 (04/23 2143) Pulse Rate: 58 (04/23 2100)  Labs: Recent Labs    12/13/17 1050 12/13/17 1056 12/14/17 0432  HGB 13.9 15.0 13.5  HCT 42.9 44.0 41.2  PLT 173  --  202  LABPROT 13.9  --   --   INR 1.08  --   --   CREATININE 1.17 1.10 0.92    Estimated Creatinine Clearance: 125.9 mL/min (by C-G formula based on SCr of 0.92 mg/dL).   Medical History: Past Medical History:  Diagnosis Date  . CAD S/P percutaneous coronary angioplasty 09/20/2016   Nstemi 08/2016. Dr. Algie CofferKadakia. DES- brilinta and asa. [facility]; 95% Ramus -> PCI Resolute Onyx DES 2.75 x 18  . Chronic combined systolic and diastolic heart failure (HCC) 10/09/2016   EF 45% and grade II diastolic after nstemi  . Diabetes mellitus without complication (HCC)   . DJD (degenerative joint disease)   . Gout   . H/O non-ST elevation myocardial infarction (NSTEMI) 09/20/2016   95% Ramus - > PCI   . Hypertension   . Obesity      Assessment: 66yom admitted after falling and hitting head.  Head CT negative but does have spinal cord injury.   Hx CAD with stent 1/18 remains on clopidogrel  CBC stable Pleuritic chest pain today, + DDImer 12> + chest CT for PE Begin heparin drip Goal of Therapy:  Heparin level 0.3-0.7 units/ml Monitor platelets by anticoagulation protocol: Yes   Plan:  Heparin bolus 6000 utsiv x1 Heparin drip 2000 uts/hr Daily HL, CBC  Leota SauersLisa Javarian Jakubiak Pharm.D. CPP, BCPS Clinical Pharmacist [phone] 12/15/2017 10:04 PM

## 2017-12-16 DIAGNOSIS — I2699 Other pulmonary embolism without acute cor pulmonale: Secondary | ICD-10-CM

## 2017-12-16 DIAGNOSIS — I5042 Chronic combined systolic (congestive) and diastolic (congestive) heart failure: Secondary | ICD-10-CM

## 2017-12-16 DIAGNOSIS — I719 Aortic aneurysm of unspecified site, without rupture: Secondary | ICD-10-CM | POA: Diagnosis present

## 2017-12-16 LAB — CBC
HEMATOCRIT: 42.7 % (ref 39.0–52.0)
Hemoglobin: 13.8 g/dL (ref 13.0–17.0)
MCH: 29.3 pg (ref 26.0–34.0)
MCHC: 32.3 g/dL (ref 30.0–36.0)
MCV: 90.7 fL (ref 78.0–100.0)
Platelets: 206 10*3/uL (ref 150–400)
RBC: 4.71 MIL/uL (ref 4.22–5.81)
RDW: 14.6 % (ref 11.5–15.5)
WBC: 13.9 10*3/uL — ABNORMAL HIGH (ref 4.0–10.5)

## 2017-12-16 LAB — BASIC METABOLIC PANEL
Anion gap: 12 (ref 5–15)
BUN: 21 mg/dL — AB (ref 6–20)
CALCIUM: 8.4 mg/dL — AB (ref 8.9–10.3)
CO2: 20 mmol/L — AB (ref 22–32)
CREATININE: 1.04 mg/dL (ref 0.61–1.24)
Chloride: 103 mmol/L (ref 101–111)
GFR calc Af Amer: >60 mL/min (ref >60)
GFR calc non Af Amer: >60 mL/min (ref >60)
GLUCOSE: 114 mg/dL — AB (ref 65–99)
Potassium: 5.7 mmol/L — ABNORMAL HIGH (ref 3.5–5.1)
Sodium: 135 mmol/L (ref 135–145)

## 2017-12-16 LAB — GLUCOSE, CAPILLARY
GLUCOSE-CAPILLARY: 125 mg/dL — AB (ref 65–99)
GLUCOSE-CAPILLARY: 149 mg/dL — AB (ref 65–99)
GLUCOSE-CAPILLARY: 129 mg/dL — AB (ref 65–99)
GLUCOSE-CAPILLARY: 116 mg/dL — AB (ref 65–99)

## 2017-12-16 LAB — HEPARIN LEVEL (UNFRACTIONATED)
Heparin Unfractionated: 0.7 IU/mL (ref 0.30–0.70)
Heparin Unfractionated: 0.64 IU/mL (ref 0.30–0.70)

## 2017-12-16 NOTE — Progress Notes (Addendum)
Subjective: Patient reports "I feel alright" "They found a blood clot in my lung...had some pain when I took a deep breath"  Objective: Vital signs in last 24 hours: Temp:  [97.7 F (36.5 C)-98.2 F (36.8 C)] 98.2 F (36.8 C) (04/24 0329) Pulse Rate:  [58-78] 60 (04/24 0000) Resp:  [16-18] 16 (04/24 0000) BP: (117-165)/(61-84) 160/77 (04/24 0000) SpO2:  [94 %-97 %] 94 % (04/24 0000)  Intake/Output from previous day: 04/23 0701 - 04/24 0700 In: 600 [P.O.:600] Out: 1000 [Urine:1000] Intake/Output this shift: No intake/output data recorded.  Alert, conversant.  Family member present. Pt reports no pain at present, noting "tenderness" in arms. He notes slowly improving BUE strength. Collar in use.  PE revealed by CT yesterday, Heparin initiated yesterday.   Lab Results: Recent Labs    12/14/17 0432 12/16/17 0332  WBC 10.2 13.9*  HGB 13.5 13.8  HCT 41.2 42.7  PLT 202 206   BMET Recent Labs    12/13/17 1050 12/13/17 1056 12/14/17 0432  NA 137 140 134*  K 3.9 4.3 3.9  CL 104 104 105  CO2 21*  --  20*  GLUCOSE 173* 175* 158*  BUN 23* 31* 16  CREATININE 1.17 1.10 0.92  CALCIUM 8.6*  --  8.5*    Studies/Results: Ct Angio Chest Pe W Or Wo Contrast  Addendum Date: 12/15/2017   ADDENDUM REPORT: 12/15/2017 21:28 ADDENDUM: These results were called by telephone at the time of interpretation on 12/15/2017 at 9:28 pm to Dr. Craige CottaKirby, who verbally acknowledged these results. Electronically Signed   By: Mitzi HansenLance  Furusawa-Stratton M.D.   On: 12/15/2017 21:28   Result Date: 12/15/2017 CLINICAL DATA:  67 y/o M; shortness of breath, PE suspected, intermediate probability, positive D-dimer. EXAM: CT ANGIOGRAPHY CHEST WITH CONTRAST TECHNIQUE: Multidetector CT imaging of the chest was performed using the standard protocol during bolus administration of intravenous contrast. Multiplanar CT image reconstructions and MIPs were obtained to evaluate the vascular anatomy. CONTRAST:  100mL ISOVUE-370  IOPAMIDOL (ISOVUE-370) INJECTION 76% COMPARISON:  09/18/2016 CTA of the chest. FINDINGS: Cardiovascular: 4.0 cm ascending aorta. Mild calcific atherosclerosis of the aortic arch. Normal heart size. No pericardial effusion. Moderate coronary artery calcific atherosclerosis. Right main pulmonary artery acute embolus extending into multiple segmental arteries. RV/LV ratio equals 0.66. Mediastinum/Nodes: No enlarged mediastinal, hilar, or axillary lymph nodes. Thyroid gland, trachea, and esophagus demonstrate no significant findings. Lungs/Pleura: Lungs are clear. No pleural effusion or pneumothorax. Upper Abdomen: No acute abnormality. Musculoskeletal: Moderate osteoarthrosis of the glenohumeral joint with fibrocystic degeneration of the articular surfaces. Review of the MIP images confirms the above findings. IMPRESSION: 1. Acute pulmonary embolus in right main pulmonary artery extending in the multiple segmental arteries. RV/LV ratio = 0.66. 2. Stable 4.0 cm ascending aortic aneurysm. Recommend annual imaging followup by CTA or MRA. This recommendation follows 2010 ACCF/AHA/AATS/ACR/ASA/SCA/SCAI/SIR/STS/SVM Guidelines for the Diagnosis and Management of Patients with Thoracic Aortic Disease. Circulation. 2010; 121: W098-J191: E266-e369. 3. Moderate coronary artery calcific atherosclerosis. Electronically Signed: By: Mitzi HansenLance  Furusawa-Stratton M.D. On: 12/15/2017 20:52    Assessment/Plan:   LOS: 3 days  Hopeful of CIR. Continue Vista collar.   Georgiann Cockeroteat, Brian 12/16/2017, 7:31 AM  Anticoagulation for PE.  Improving neuro exam.  To Rehab, then surgery when cleared medically.

## 2017-12-16 NOTE — Progress Notes (Signed)
ANTICOAGULATION CONSULT NOTE - Follow Up Consult  Pharmacy Consult for Heparin Indication: pulmonary embolus  Allergies  Allergen Reactions  . Ace Inhibitors     Angioedema  . Other Hives and Itching    msg    Patient Measurements: Height: 6' (182.9 cm) Weight: (!) 381 lb 9.9 oz (173.1 kg) IBW/kg (Calculated) : 77.6 Heparin Dosing Weight: 123  Vital Signs: Temp: 98.2 F (36.8 C) (04/24 0329) Temp Source: Oral (04/24 0329) BP: 160/77 (04/24 0000) Pulse Rate: 60 (04/24 0000)  Labs: Recent Labs    12/13/17 1050 12/13/17 1056 12/14/17 0432 12/16/17 0332 12/16/17 0638  HGB 13.9 15.0 13.5 13.8  --   HCT 42.9 44.0 41.2 42.7  --   PLT 173  --  202 206  --   LABPROT 13.9  --   --   --   --   INR 1.08  --   --   --   --   HEPARINUNFRC  --   --   --   --  0.70  CREATININE 1.17 1.10 0.92  --   --     Estimated Creatinine Clearance: 125.9 mL/min (by C-G formula based on SCr of 0.92 mg/dL).  Assessment: 66yom admitted after falling and hitting head.  Head CT negative but does have spinal cord injury.   Hx CAD with stent 1/18 remains on clopidogrel  CBC stable Pleuritic chest pain yesterday, + DDImer 12> + chest CT for PE On heparin drip  Goal of Therapy:  Heparin level 0.3-0.7 units/ml Monitor platelets by anticoagulation protocol: Yes   Plan:  Continue heparin drip at 2000 units/hr Repeat Heparin level today, if within goal range will go to heparin levels daily Monitor CBC   Tera Materatherine A Sharrod Achille, PharmD, FCCM 12/16/2017,7:39 AM

## 2017-12-16 NOTE — Progress Notes (Signed)
Later PM NOTE    12/16/17 1700  PT Visit Information  Last PT Received On 12/16/17  Assistance Needed +3 or more  History of Present Illness This 67 y.o. male admitted after falling and striking head in bathroom while attempting to step into tub.  Cervical MRI showed multi level DDD with moderate canal stenosis C4/5, severe Lt C5/6, and severe Rt stenosis C6/7 with increased cord signal C4.   PMH includes: NSTEMI, obesity, Gout, DM, CAD, systolic and diastolic HF   Precautions  Precautions Cervical  Required Braces or Orthoses Cervical Brace  Cervical Brace Hard collar  Pain Assessment  Pain Assessment Faces  Faces Pain Scale 4  Pain Location Lt knee, arms  Pain Descriptors / Indicators Sore  Pain Intervention(s) Monitored during session  Cognition  Arousal/Alertness Awake/alert  Behavior During Therapy WFL for tasks assessed/performed  Overall Cognitive Status Within Functional Limits for tasks assessed  General Comments No apparent cognitive deficits noted   Bed Mobility  Overal bed mobility Needs Assistance  Bed Mobility Rolling  Rolling Mod assist;+2 for physical assistance  Transfers  Overall transfer level Needs assistance  Equipment used Ambulation equipment used  Transfer via Primary school teacherLift Equipment Stedy;Maximove  Transfers Sit to/from Stand  Sit to Stand Max assist (+3)  General transfer comment attempted transfer by stedy x1 with 3 person assist, but the pt unable to get his L LE under him from such a low position.  So used maximove to transfer to the bed.  Balance  Overall balance assessment Needs assistance  Sitting balance-[name] Fair  PT - End of Session  Activity Tolerance Patient tolerated treatment well  Patient left in bed;with call bell/phone within reach;with nursing/sitter in room  Nurse Communication Mobility status;Need for lift equipment   PT - Assessment/Plan  PT Plan Current plan remains appropriate  PT Visit Diagnosis Muscle weakness (generalized)  (M62.81);Other symptoms and signs involving the nervous system (R29.898);Pain  Pain - part of body Knee  PT Frequency (ACUTE ONLY) Min 4X/week  Recommendations for Other Services Rehab consult  Follow Up Recommendations CIR  PT equipment Wheelchair (measurements PT);Wheelchair cushion (measurements PT);Hospital bed;3in1 (PT)  AM-PAC PT "6 Clicks" Daily Activity Outcome Measure  Difficulty turning over in bed (including adjusting bedclothes, sheets and blankets)? 1  Difficulty moving from lying on back to sitting on the side of the bed?  1  Difficulty sitting down on and standing up from a chair with arms (e.g., wheelchair, bedside commode, etc,.)? 1  Help needed moving to and from a bed to chair (including a wheelchair)? 1  Help needed walking in hospital room? 1  Help needed climbing 3-5 steps with a railing?  1  6 Click Score 6  Mobility G Code  CN  PT Goal Progression  Progress towards PT goals Progressing toward goals  Acute Rehab PT Goals  PT Goal Formulation With patient/family  Time For Goal Achievement 12/28/17  Potential to Achieve Goals Good  PT Time Calculation  PT Start Time (ACUTE ONLY) 1625  PT Stop Time (ACUTE ONLY) 1650  PT Time Calculation (min) (ACUTE ONLY) 25 min  PT General Charges  $$ ACUTE PT VISIT 1 Visit  PT Treatments  $Therapeutic Activity 23-37 mins   12/16/2017  [place] BingKen Devontae Casasola, PT [phone]  (pager)

## 2017-12-16 NOTE — Progress Notes (Signed)
Physical Therapy Treatment Patient Details Name: Franklin Hicks MRN: [id number] DOB: 09/06/1950 Today's Date: 12/16/2017    History of Present Illness This 67 y.o. male admitted after falling and striking head in bathroom while attempting to step into tub.  Cervical MRI showed multi level DDD with moderate canal stenosis C4/5, severe Lt C5/6, and severe Rt stenosis C6/7 with increased cord signal C4.   PMH includes: NSTEMI, obesity, Gout, DM, CAD, systolic and diastolic HF     PT Comments    Mild improvements today.  Emphasis on transitions to EOB and standing in the STEDY.  Pt able to stand with less effort and managed to sit with less L LE complications.   Follow Up Recommendations  CIR     Equipment Recommendations  Wheelchair (measurements PT);Wheelchair cushion (measurements PT);Hospital bed;3in1 (PT)    Recommendations for Other Services Rehab consult     Precautions / Restrictions Precautions Precautions: Cervical Required Braces or Orthoses: Cervical Brace Cervical Brace: Hard collar    Mobility  Bed Mobility Overal bed mobility: Needs Assistance Bed Mobility: Rolling Rolling: Mod assist;+2 for physical assistance Sidelying to sit: Mod assist;+2 for physical assistance       General bed mobility comments: Assisted trunk and L leg to roll and trunk to get up.  Transfers Overall transfer level: Needs assistance Equipment used: Ambulation equipment used Transfers: Sit to/from Stand Sit to Stand: Mod assist;Max assist;+2 physical assistance;+2 safety/equipment         General transfer comment: use of pad to help bring hip forward.  pt used bil UE to pul up.   Ambulation/Gait                 Stairs             Wheelchair Mobility    Modified Rankin (Stroke Patients Only)       Balance Overall balance assessment: Needs assistance   Sitting balance-[name]: Fair     Standing balance support: Bilateral upper extremity supported;During  functional activity Standing balance-[name]: Poor Standing balance comment: pt stood in STEDY x3. Second trial, pt stepped his left LE up under him and worked on w/shifts for 25 secs.  I sitting in the STEDY,, pt worked on modified arm pushups.                            Cognition Arousal/Alertness: Awake/alert Behavior During Therapy: WFL for tasks assessed/performed Overall Cognitive Status: Within Functional Limits for tasks assessed                                 General Comments: No apparent cognitive deficits noted       Exercises Other Exercises Other Exercises: AROM/AAROM to bil UE (shd flex, biceps/triceps), bil LE's hi/knee flexion/extension.    General Comments General comments (skin integrity, edema, etc.): vss      Pertinent Vitals/Pain Pain Assessment: Faces Faces Pain Scale: Hurts little more Pain Location: Lt knee, arms Pain Descriptors / Indicators: Sore Pain Intervention(s): Monitored during session    Home Living                      Prior Function            PT Goals (current goals can now be found in the care plan section) Acute Rehab PT Goals PT Goal Formulation: With patient/family Time  For Goal Achievement: 12/28/17 Potential to Achieve Goals: Good Progress towards PT goals: Progressing toward goals    Frequency    Min 4X/week      PT Plan Current plan remains appropriate    Co-evaluation              AM-PAC PT "6 Clicks" Daily Activity  Outcome Measure  Difficulty turning over in bed (including adjusting bedclothes, sheets and blankets)?: Unable Difficulty moving from lying on back to sitting on the side of the bed? : Unable Difficulty sitting down on and standing up from a chair with arms (e.g., wheelchair, bedside commode, etc,.)?: Unable Help needed moving to and from a bed to chair (including a wheelchair)?: Total Help needed walking in hospital room?: Total Help needed climbing  3-5 steps with a railing? : Total 6 Click Score: 6    End of Session Equipment Utilized During Treatment: Cervical collar Activity Tolerance: Patient tolerated treatment well Patient left: in chair;with call bell/phone within reach;Other (comment)(left on a lift pad.) Nurse Communication: Mobility status;Need for lift equipment PT Visit Diagnosis: Muscle weakness (generalized) (M62.81);Other symptoms and signs involving the nervous system (R29.898);Pain Pain - part of body: Knee     Time: 6962-9528 PT Time Calculation (min) (ACUTE ONLY): 39 min  Charges:  $Therapeutic Exercise: 8-22 mins $Therapeutic Activity: 23-37 mins                    G Codes:       12/22/17  [place] Bing, PT [phone]  (pager)   Eliseo Gum Shashank Kwasnik 12-22-17, 2:37 PM

## 2017-12-16 NOTE — H&P (Signed)
Physical Medicine and Rehabilitation Admission H&P    Chief Complaint  Patient presents with  . Central cord injury    HPI: Franklin Hicks is a 67 year old male with history of CAD with chronic combined CHF, T2DM, morbid obesity, hypertension, PE in the past;  who was admitted on 12/13/2017 after falling forwards in the bathroom with inability to move.  He reported numbness and tingling bilateral hands as well as bilateral lower extremities.  MRI of cervical spine done showing multilevel DJD with moderate canal stenosis C4-5, severe left C5-6 and severe right stenosis C6-7 with increase in cord signal at C4.  He was evaluated by Dr. Vertell Limber who recommended steroids to help manage central cord injury as well as extensive rehab and collar for stabilization.  He developed pleuritic chest pain on 4-23 and was found to have acute PE and right main pulmonary artery extending to multiple segmental arteries and stable 4 cm ascending aortic aneurysm.  He was started on IV heparin for treatment.  NS to discuss ACDF after rehab complete?  Therapy ongoing and working on pregait activity. CIR recommended due to functional deficits.    Review of Systems  Constitutional: Negative for chills and fever.  HENT: Negative for hearing loss and tinnitus.   Eyes: Negative for blurred vision and double vision.  Respiratory: Positive for shortness of breath. Negative for cough.   Cardiovascular: Positive for chest pain and leg swelling.  Gastrointestinal: Negative for constipation, heartburn and nausea.  Genitourinary: Negative for dysuria and urgency.  Musculoskeletal: Positive for back pain, joint pain (has bad knees) and myalgias.  Skin: Negative for itching and rash.  Neurological: Positive for sensory change (LUE "tender" to touch. ) and focal weakness. Negative for dizziness.  Psychiatric/Behavioral: Negative for depression. The patient is nervous/anxious. The patient does not have insomnia.       Past  Medical History:  Diagnosis Date  . CAD S/P percutaneous coronary angioplasty 09/20/2016   Nstemi 08/2016. Dr. Doylene Canard. DES- brilinta and asa. Requests change to [facility] cardiology; 95% Ramus -> PCI Resolute Onyx DES 2.75 x 18  . Chronic combined systolic and diastolic heart failure ([place]) 10/09/2016   EF 45% and grade II diastolic after nstemi  . Diabetes mellitus without complication ([place])   . DJD (degenerative joint disease)   . Gout   . H/O non-ST elevation myocardial infarction (NSTEMI) 09/20/2016   95% Ramus - > PCI   . Hypertension   . Obesity     Past Surgical History:  Procedure Laterality Date  . CARDIAC CATHETERIZATION N/A 09/19/2016   Procedure: Left Heart Cath and Coronary Angiography;  Surgeon: Dixie Dials, MD;  Location: [facility];  Service: Cardiovascular: 95% proximal Ramus Intermedius --> PCI  . CARDIAC CATHETERIZATION N/A 09/19/2016   Procedure: Coronary Stent Intervention;  Surgeon: Nelva Bush, MD;  Location: [facility];  Service: Cardiovascular: 95% ramus intermedius;  Resolute Onyx 2.75 x 18 mm drug-eluting stent  . left knee open meniscetomy    . TRANSTHORACIC ECHOCARDIOGRAM  2004   no lvh nl ejection fraction  . TRANSTHORACIC ECHOCARDIOGRAM  09/19/2016   In setting of NSTEMI:  EF 45-50% with diffuse hypokinesis. GR 2 DD. Mild biatrial enlargement.  . ureteral stone  [id number]    Family History  Problem Relation Age of Onset  . Liver disease Mother     Social History:  Married. Independent with 2 canes PTA. Family who lives with them manages home/meals. He   reports that  he quit smoking about 40 years ago. His smoking use included cigarettes. He has a 13.00 pack-year smoking history. He has never used smokeless tobacco. He reports that he drinks alcohol. He reports that he does not use drugs.   Allergies  Allergen Reactions  . Ace Inhibitors     Angioedema  . Other Hives and Itching    msg    Medications Prior to Admission  Medication  Sig Dispense Refill  . atorvastatin (LIPITOR) 80 MG tablet TAKE 1 TABLET (80 MG TOTAL) BY MOUTH DAILY AT 6 PM. 30 tablet 3  . Chromium 200 MCG CAPS Take 200 mcg by mouth daily.     Marland Kitchen CINNAMON PO Take 1 capsule by mouth daily.    . clopidogrel (PLAVIX) 75 MG tablet Take 1 tablet (75 mg total) by mouth daily. 30 tablet 11  . furosemide (LASIX) 20 MG tablet Take 1 tablet (20 mg total) by mouth every Monday, Wednesday, and Friday. 15 tablet 3  . Krill Oil 1000 MG CAPS Take 1,000 mg by mouth daily.     . metoprolol tartrate (LOPRESSOR) 50 MG tablet Take 1 tablet (50 mg total) by mouth 2 (two) times daily. 60 tablet 3  . Multiple Vitamin (MULTIVITAMIN WITH MINERALS) TABS tablet Take 1 tablet by mouth daily.    . Potassium Citrate 15 MEQ (1620 MG) TBCR Take 1 tablet by mouth 2 (two) times daily.  11  . terazosin (HYTRIN) 10 MG capsule TAKE 1 CAPSULE (10 MG TOTAL) BY MOUTH AT BEDTIME. (Patient taking differently: Take 10 mg by mouth at bedtime. TAKE 1 CAPSULE (10 MG TOTAL) BY MOUTH AT BEDTIME.) 30 capsule 5  . vitamin B-12 (CYANOCOBALAMIN) 100 MCG tablet Take 100 mcg by mouth daily.      Drug Regimen Review  Drug regimen was reviewed and remains appropriate with no significant issues identified  Home: Home Living Family/patient expects to be discharged to:: Private residence Living Arrangements: Spouse/significant other Available Help at Discharge: Family Type of Home: House Home Access: Stairs to enter [employer] of Steps: 6(2 in the back to get onto main level wiht bedroom) Home Layout: Multi-level, Able to live on main level with bedroom/bathroom, Laundry or work area in basement Alternate Therapist, sports of Steps: 6 Bathroom Shower/Tub: Chiropodist: [place]: Cane - single point, Grab bars - tub/shower Additional Comments: back entrance with 2 steps that would get you to the main level where the kitchen and bed/bath are if it is not muddy  they can pull the car around to the steps.    Functional History: Prior Function Level of Independence: Needs assistance Gait / Transfers Assistance Needed: walking with 2 canes all the time ADL's / Homemaking Assistance Needed: bathed and dressed independently Comments: drives, retired Tourist information centre manager (taught PE)  Functional Status:  Mobility: Bed Mobility Overal bed mobility: Needs Assistance Bed Mobility: Rolling Rolling: Mod assist, +2 for physical assistance Sidelying to sit: Mod assist, +2 for physical assistance Sit to sidelying: Max assist, +2 for physical assistance, +2 for safety/equipment General bed mobility comments: Assisted trunk and L leg to roll and trunk to get up. Transfers Overall transfer level: Needs assistance Equipment used: Ambulation equipment used Transfer via Lift Equipment: Letta Median Transfers: Sit to/from Stand Sit to Stand: Max assist(+3) Stand pivot transfers: Max assist, +2 physical assistance, +2 safety/equipment General transfer comment: attempted transfer by stedy x1 with 3 person assist, but the pt unable to get his L LE under him from such a  low position.  So used maximove to transfer to the bed. Ambulation/Gait General Gait Details: unable at this time.     ADL: ADL Overall ADL's : Needs assistance/impaired Eating/Feeding: Moderate assistance, With adaptive utensils, Sitting Eating/Feeding Details (indicate cue type and reason): Pt provided with foam to build up handles of utensils as well as a lidded mug.  He was able to pick up and drink from cup with supervision, but requires min A to release mug.  He requires mod A to self feed with utensils at this time  Grooming: Wash/dry face, Moderate assistance, Sitting Grooming Details (indicate cue type and reason): mod A to wash face  Upper Body Bathing: Maximal assistance, Sitting Lower Body Bathing: Total assistance, Bed level Upper Body Dressing : Total assistance, Sitting Lower Body  Dressing: Total assistance, Sit to/from stand Toilet Transfer: Maximal assistance, +2 for physical assistance, Stand-pivot, BSC Toilet Transfer Details (indicate cue type and reason): utilized stedy for transfer  Toileting- Water quality scientist and Hygiene: Total assistance, +2 for physical assistance, Bed level Functional mobility during ADLs: Maximal assistance, +2 for physical assistance  Cognition: Cognition Overall Cognitive Status: Within Functional Limits for tasks assessed Orientation Level: Oriented X4 Cognition Arousal/Alertness: Awake/alert Behavior During Therapy: WFL for tasks assessed/performed Overall Cognitive Status: Within Functional Limits for tasks assessed General Comments: No apparent cognitive deficits noted    Blood pressure (!) 155/80, pulse 72, temperature 97.8 F (36.6 C), temperature source Oral, resp. rate 10, height 6' (1.829 m), weight (!) 173.1 kg (381 lb 9.9 oz), SpO2 97 %. Physical Exam  Nursing note and vitals reviewed. Constitutional: He is oriented to person, place, and time. He appears well-developed and well-nourished. No distress. Cervical collar in place.  Morbidly obese male, NAD.  HENT:  Head: Normocephalic and atraumatic.  Mouth/Throat: Oropharynx is clear and moist.  Resolving ecchymosis mid forehead with minimal edema and tender to touch. .   Eyes: Pupils are equal, round, and reactive to light. Conjunctivae are normal. Right eye exhibits no discharge. Left eye exhibits no discharge.  Bilateral periorbital ecchymosis.   Neck: No JVD present.  In C Collar  Cardiovascular: Normal rate and regular rhythm.  No murmur heard. Distant sounds.   Respiratory: Effort normal. No stridor. No respiratory distress. He has decreased breath sounds. He has no wheezes. He exhibits no tenderness.  GI: Soft. Bowel sounds are normal. He exhibits no distension.  Musculoskeletal: He exhibits edema.  Min edema BLE.   Neurological: He is alert and oriented  to person, place, and time. No cranial nerve deficit.  Alert and appropriate. LUE 2 to 2+/5 prox to distal. RUE 3 to 3+/5. LLE 2/5 with inhibition due to knee injury. RLE 3+/5. Senses pain and gross touch in all 4's.   Skin: Skin is warm and dry. He is not diaphoretic.  Psychiatric: He has a normal mood and affect. His behavior is normal. Judgment and thought content normal.    Results for orders placed or performed during the hospital encounter of 12/13/17 (from the past 48 hour(s))  Glucose, capillary     Status: Abnormal   Collection Time: 12/15/17 12:23 PM  Result Value Ref Range   Glucose-Capillary 146 (H) 65 - 99 mg/dL   Comment 1 Notify RN    Comment 2 Document in Chart   D-dimer, quantitative (not at [facility])     Status: Abnormal   Collection Time: 12/15/17  4:14 PM  Result Value Ref Range   D-Dimer, Quant 12.59 (H) 0.00 - 0.50 ug/mL-FEU  Comment: (NOTE) At the manufacturer cut-off of 0.50 ug/mL FEU, this assay has been documented to exclude PE with a sensitivity and negative predictive value of 97 to 99%.  At this time, this assay has not been approved by the FDA to exclude DVT/VTE. Results should be correlated with clinical presentation. Performed at [facility], [place] [address] [place], [place] [address]   Glucose, capillary     Status: Abnormal   Collection Time: 12/15/17  5:25 PM  Result Value Ref Range   Glucose-Capillary 131 (H) 65 - 99 mg/dL  Glucose, capillary     Status: Abnormal   Collection Time: 12/15/17  9:31 PM  Result Value Ref Range   Glucose-Capillary 121 (H) 65 - 99 mg/dL  CBC     Status: Abnormal   Collection Time: 12/16/17  3:32 AM  Result Value Ref Range   WBC 13.9 (H) 4.0 - 10.5 K/uL   RBC 4.71 4.22 - 5.81 MIL/uL   Hemoglobin 13.8 13.0 - 17.0 g/dL   HCT 42.7 39.0 - 52.0 %   MCV 90.7 78.0 - 100.0 fL   MCH 29.3 26.0 - 34.0 pg   MCHC 32.3 30.0 - 36.0 g/dL   RDW 14.6 11.5 - 15.5 %   Platelets 206 150 - 400 K/uL    Comment: Performed at  [facility], [place] [address] [place], [place] [address]  Basic metabolic panel     Status: Abnormal   Collection Time: 12/16/17  3:32 AM  Result Value Ref Range   Sodium 135 135 - 145 mmol/L   Potassium 5.7 (H) 3.5 - 5.1 mmol/L    Comment: HEMOLYSIS AT THIS LEVEL MAY AFFECT RESULT   Chloride 103 101 - 111 mmol/L   CO2 20 (L) 22 - 32 mmol/L   Glucose, Bld 114 (H) 65 - 99 mg/dL   BUN 21 (H) 6 - 20 mg/dL   Creatinine, Ser 1.04 0.61 - 1.24 mg/dL   Calcium 8.4 (L) 8.9 - 10.3 mg/dL   GFR calc non Af Amer >60 >60 mL/min   GFR calc Af Amer >60 >60 mL/min    Comment: (NOTE) The eGFR has been calculated using the CKD EPI equation. This calculation has not been validated in all clinical situations. eGFR's persistently <60 mL/min signify possible Chronic Kidney Disease.    Anion gap 12 5 - 15    Comment: Performed at [facility] [address], [place], [place] [address]  Heparin level (unfractionated)     Status: None   Collection Time: 12/16/17  6:38 AM  Result Value Ref Range   Heparin Unfractionated 0.70 0.30 - 0.70 IU/mL    Comment:        IF HEPARIN RESULTS ARE BELOW EXPECTED VALUES, AND PATIENT DOSAGE HAS BEEN CONFIRMED, SUGGEST FOLLOW UP TESTING OF ANTITHROMBIN III LEVELS. Performed at [facility], [place] [address] [place], [place] [address]   Glucose, capillary     Status: Abnormal   Collection Time: 12/16/17  7:45 AM  Result Value Ref Range   Glucose-Capillary 125 (H) 65 - 99 mg/dL  Glucose, capillary     Status: Abnormal   Collection Time: 12/16/17 12:25 PM  Result Value Ref Range   Glucose-Capillary 149 (H) 65 - 99 mg/dL  Heparin level (unfractionated)     Status: None   Collection Time: 12/16/17  2:42 PM  Result Value Ref Range   Heparin Unfractionated 0.64 0.30 - 0.70 IU/mL    Comment:  IF HEPARIN RESULTS ARE BELOW EXPECTED VALUES, AND PATIENT DOSAGE HAS BEEN CONFIRMED, SUGGEST FOLLOW UP TESTING OF ANTITHROMBIN III LEVELS. Performed at  [facility], [place] [address], [place], [place] [address]   Glucose, capillary     Status: Abnormal   Collection Time: 12/16/17  4:57 PM  Result Value Ref Range   Glucose-Capillary 129 (H) 65 - 99 mg/dL  Glucose, capillary     Status: Abnormal   Collection Time: 12/16/17  9:31 PM  Result Value Ref Range   Glucose-Capillary 116 (H) 65 - 99 mg/dL  Basic metabolic panel     Status: Abnormal   Collection Time: 12/16/17 11:17 PM  Result Value Ref Range   Sodium 136 135 - 145 mmol/L   Potassium 3.8 3.5 - 5.1 mmol/L   Chloride 103 101 - 111 mmol/L   CO2 27 22 - 32 mmol/L   Glucose, Bld 114 (H) 65 - 99 mg/dL   BUN 20 6 - 20 mg/dL   Creatinine, Ser 1.03 0.61 - 1.24 mg/dL   Calcium 8.4 (L) 8.9 - 10.3 mg/dL   GFR calc non Af Amer >60 >60 mL/min   GFR calc Af Amer >60 >60 mL/min    Comment: (NOTE) The eGFR has been calculated using the CKD EPI equation. This calculation has not been validated in all clinical situations. eGFR's persistently <60 mL/min signify possible Chronic Kidney Disease.    Anion gap 6 5 - 15    Comment: Performed at [facility] [address], [place], [place] [address]  Heparin level (unfractionated)     Status: Abnormal   Collection Time: 12/17/17  7:06 AM  Result Value Ref Range   Heparin Unfractionated 0.81 (H) 0.30 - 0.70 IU/mL    Comment:        IF HEPARIN RESULTS ARE BELOW EXPECTED VALUES, AND PATIENT DOSAGE HAS BEEN CONFIRMED, SUGGEST FOLLOW UP TESTING OF ANTITHROMBIN III LEVELS. Performed at [facility], [place] [address] [place], [place] [address]   Basic metabolic panel     Status: Abnormal   Collection Time: 12/17/17  7:06 AM  Result Value Ref Range   Sodium 135 135 - 145 mmol/L   Potassium 4.0 3.5 - 5.1 mmol/L   Chloride 103 101 - 111 mmol/L   CO2 23 22 - 32 mmol/L   Glucose, Bld 113 (H) 65 - 99 mg/dL   BUN 19 6 - 20 mg/dL   Creatinine, Ser 0.96 0.61 - 1.24 mg/dL   Calcium 8.3 (L) 8.9 - 10.3 mg/dL   GFR calc non Af Amer >60  >60 mL/min   GFR calc Af Amer >60 >60 mL/min    Comment: (NOTE) The eGFR has been calculated using the CKD EPI equation. This calculation has not been validated in all clinical situations. eGFR's persistently <60 mL/min signify possible Chronic Kidney Disease.    Anion gap 9 5 - 15    Comment: Performed at [facility] [address], [place], [place] [address]  Glucose, capillary     Status: Abnormal   Collection Time: 12/17/17  7:48 AM  Result Value Ref Range   Glucose-Capillary 127 (H) 65 - 99 mg/dL   Ct Angio Chest Pe W Or Wo Contrast  Addendum Date: 12/15/2017   ADDENDUM REPORT: 12/15/2017 21:28 ADDENDUM: These results were called by telephone at the time of interpretation on 12/15/2017 at 9:28 pm to Dr. Baltazar Najjar, who verbally acknowledged these results. Electronically Signed   By: Mia Creek  [place]ratton M.D.   On: 12/15/2017 21:28   Result Date: 12/15/2017 CLINICAL DATA:  67 y/o M; shortness of breath, PE suspected, intermediate probability, positive D-dimer. EXAM: CT ANGIOGRAPHY CHEST WITH CONTRAST TECHNIQUE: Multidetector CT imaging of the chest was performed using the standard protocol during bolus administration of intravenous contrast. Multiplanar CT image reconstructions and MIPs were obtained to evaluate the vascular anatomy. CONTRAST:  186m ISOVUE-370 IOPAMIDOL (ISOVUE-370) INJECTION 76% COMPARISON:  09/18/2016 CTA of the chest. FINDINGS: Cardiovascular: 4.0 cm ascending aorta. Mild calcific atherosclerosis of the aortic arch. Normal heart size. No pericardial effusion. Moderate coronary artery calcific atherosclerosis. Right main pulmonary artery acute embolus extending into multiple segmental arteries. RV/LV ratio equals 0.66. Mediastinum/Nodes: No enlarged mediastinal, hilar, or axillary lymph nodes. Thyroid gland, trachea, and esophagus demonstrate no significant findings. Lungs/Pleura: Lungs are clear. No pleural effusion or pneumothorax. Upper Abdomen: No acute  abnormality. Musculoskeletal: Moderate osteoarthrosis of the glenohumeral joint with fibrocystic degeneration of the articular surfaces. Review of the MIP images confirms the above findings. IMPRESSION: 1. Acute pulmonary embolus in right main pulmonary artery extending in the multiple segmental arteries. RV/LV ratio = 0.66. 2. Stable 4.0 cm ascending aortic aneurysm. Recommend annual imaging followup by CTA or MRA. This recommendation follows 2010 ACCF/AHA/AATS/ACR/ASA/SCA/SCAI/SIR/STS/SVM Guidelines for the Diagnosis and Management of Patients with Thoracic Aortic Disease. Circulation. 2010; 121:: [id number] 3. Moderate coronary artery calcific atherosclerosis. Electronically Signed: By: LKristine GarbeM.D. On: 12/15/2017 20:52       Medical Problem List and Plan: 1.  Functional deficits secondary to C4 Spinal cord injury, motor and sensory incomplete  -admit to inpatient rehab 2.  DVT/PE/Anticoagulation: Pharmaceutical: Heparin at present 3. Pain Management: managed with prn medications.  4. Mood: LCSW to follow for evaluation and support.  5. Neuropsych: This patient is capable of making decisions on his own behalf. 6. Skin/Wound Care:  Routine pressure relief measures--order air mattress due to limited mobility and body habitus. Maintain adequate nutritional and hydration status.  7. Fluids/Electrolytes/Nutrition: Monitor I/O. Offer supplements prn if intake is poor. Will check lytes in am. 8. T2DM:  Monitor BS ac/hs and use SSI for elevated BS. Continue to titrate medications for tighter control.  9. CAD: Managed with Lipitor, Plavix, and metoprolol 10.  Morbid obesity:  Body mass index is 51.76 kg/m.             Diet and exercise education             Encourage weight loss to help with mobility/endurance and promote overall health 11. Combined systolic/diastolic CHF: Monitor weights daily. Continue Lasix, Plavix, statin and metoprolol.   12. HTN: Monitor BP bid. Continue  terazosin and furosemide.  162 H/o BPH: Reports voiding without difficulty--on Hytrin.  14. Bowel and bladder: appears to be continent   Post Admission Physician Evaluation: 1. Functional deficits secondary  to C4 SCI. 2. Patient is admitted to receive collaborative, interdisciplinary care between the physiatrist, rehab nursing staff, and therapy team. 3. Patient's level of medical complexity and substantial therapy needs in context of that medical necessity cannot be provided at a lesser intensity of care such as a SNF. 4. Patient has experienced substantial functional loss from his/her baseline which was documented above under the "Functional History" and "Functional Status" headings.  Judging by the patient's diagnosis, physical exam, and functional history, the patient has potential for functional progress which will result in measurable gains while on inpatient rehab.  These gains will be of substantial and practical use upon discharge  in facilitating mobility and self-care at the household level. 5. Physiatrist will provide 24 hour management of medical needs as well as oversight of the therapy plan/treatment and provide guidance as appropriate regarding the interaction of the two. 6. The Preadmission Screening has been reviewed and patient status is unchanged unless otherwise stated above. 7. 24 hour rehab nursing will assist with bladder management, bowel management, safety, skin/wound care, disease management, medication administration, pain management and patient education  and help integrate therapy concepts, techniques,education, etc. 8. PT will assess and treat for/with: Lower extremity strength, range of motion, stamina, balance, functional mobility, safety, adaptive techniques and equipment, surgical precautions, pain control, NMR, family ed.   Goals are: supervision to min/mod assist. 9. OT will assess and treat for/with: ADL's, functional mobility, safety, upper extremity strength,  adaptive techniques and equipment, NMR, pain control, surgical precautions, family education.   Goals are: supervision to min/mod assist. Therapy may not yet proceed with showering this patient. 10. SLP will assess and treat for/with: n/a.  Goals are: n/a. 11. Case Management and Social Worker will assess and treat for psychological issues and discharge planning. 12. Team conference will be held weekly to assess progress toward goals and to determine barriers to discharge. 13. Patient will receive at least 3 hours of therapy per day at least 5 days per week. 14. ELOS: 20-27 days       15. Prognosis:  excellent      Bary Leriche, PA-C 12/17/2017

## 2017-12-16 NOTE — Progress Notes (Signed)
ANTICOAGULATION CONSULT NOTE - Follow Up Consult  Pharmacy Consult for Heparin Indication: pulmonary embolus  Allergies  Allergen Reactions  . Ace Inhibitors     Angioedema  . Other Hives and Itching    msg    Patient Measurements: Height: 6' (182.9 cm) Weight: (!) 381 lb 9.9 oz (173.1 kg) IBW/kg (Calculated) : 77.6 Heparin Dosing Weight: 123  Vital Signs: Temp: 97.5 F (36.4 C) (04/24 1226) Temp Source: Oral (04/24 1226) BP: 143/67 (04/24 1226) Pulse Rate: 62 (04/24 1226)  Labs: Recent Labs    12/14/17 0432 12/16/17 0332 12/16/17 0638 12/16/17 1442  HGB 13.5 13.8  --   --   HCT 41.2 42.7  --   --   PLT 202 206  --   --   HEPARINUNFRC  --   --  0.70 0.64  CREATININE 0.92 1.04  --   --     Estimated Creatinine Clearance: 111.3 mL/min (by C-G formula based on SCr of 1.04 mg/dL).  Assessment: 66yom admitted after falling and hitting head.  Head CT negative but does have spinal cord injury.   Hx CAD with stent 1/18 remains on clopidogrel  CBC stable Pleuritic chest pain yesterday, + DDImer 12> + chest CT for PE On heparin drip.  Repeat HL 0.64  Goal of Therapy:  Heparin level 0.3-0.7 units/ml Monitor platelets by anticoagulation protocol: Yes   Plan:  Continue heparin drip at 2000 units/hr HL within goal range, so will go to heparin levels daily Monitor CBC  Kenslie Abbruzzese A. Jeanella CrazePierce, PharmD, BCPS Clinical Pharmacist [place] Pager: [phone]  12/16/2017,4:11 PM

## 2017-12-16 NOTE — Progress Notes (Signed)
Subjective: Patient reports getting stronger  Objective: Vital signs in last 24 hours: Temp:  [97.5 F (36.4 C)-98.2 F (36.8 C)] 97.5 F (36.4 C) (04/24 1226) Pulse Rate:  [58-83] 62 (04/24 1226) Resp:  [13-18] 13 (04/24 1226) BP: (143-165)/(64-84) 143/67 (04/24 1226) SpO2:  [94 %-97 %] 97 % (04/24 1226)  Intake/Output from previous day: 04/23 0701 - 04/24 0700 In: 600 [P.O.:600] Out: 1000 [Urine:1000] Intake/Output this shift: Total I/O In: 660.3 [P.O.:480; I.V.:180.3] Out: -   Physical Exam: Much improved in both upper extremities.  Still quite weak in both hands, but improved deltoids, biceps, triceps.  Dysesthesias are also improved.  Lab Results: Recent Labs    12/14/17 0432 12/16/17 0332  WBC 10.2 13.9*  HGB 13.5 13.8  HCT 41.2 42.7  PLT 202 206   BMET Recent Labs    12/14/17 0432 12/16/17 0332  NA 134* 135  K 3.9 5.7*  CL 105 103  CO2 20* 20*  GLUCOSE 158* 114*  BUN 16 21*  CREATININE 0.92 1.04  CALCIUM 8.5* 8.4*    Studies/Results: Ct Angio Chest Pe W Or Wo Contrast  Addendum Date: 12/15/2017   ADDENDUM REPORT: 12/15/2017 21:28 ADDENDUM: These results were called by telephone at the time of interpretation on 12/15/2017 at 9:28 pm to Dr. Craige CottaKirby, who verbally acknowledged these results. Electronically Signed   By: Mitzi HansenLance  Furusawa-Stratton M.D.   On: 12/15/2017 21:28   Result Date: 12/15/2017 CLINICAL DATA:  67 y/o M; shortness of breath, PE suspected, intermediate probability, positive D-dimer. EXAM: CT ANGIOGRAPHY CHEST WITH CONTRAST TECHNIQUE: Multidetector CT imaging of the chest was performed using the standard protocol during bolus administration of intravenous contrast. Multiplanar CT image reconstructions and MIPs were obtained to evaluate the vascular anatomy. CONTRAST:  100mL ISOVUE-370 IOPAMIDOL (ISOVUE-370) INJECTION 76% COMPARISON:  09/18/2016 CTA of the chest. FINDINGS: Cardiovascular: 4.0 cm ascending aorta. Mild calcific atherosclerosis of  the aortic arch. Normal heart size. No pericardial effusion. Moderate coronary artery calcific atherosclerosis. Right main pulmonary artery acute embolus extending into multiple segmental arteries. RV/LV ratio equals 0.66. Mediastinum/Nodes: No enlarged mediastinal, hilar, or axillary lymph nodes. Thyroid gland, trachea, and esophagus demonstrate no significant findings. Lungs/Pleura: Lungs are clear. No pleural effusion or pneumothorax. Upper Abdomen: No acute abnormality. Musculoskeletal: Moderate osteoarthrosis of the glenohumeral joint with fibrocystic degeneration of the articular surfaces. Review of the MIP images confirms the above findings. IMPRESSION: 1. Acute pulmonary embolus in right main pulmonary artery extending in the multiple segmental arteries. RV/LV ratio = 0.66. 2. Stable 4.0 cm ascending aortic aneurysm. Recommend annual imaging followup by CTA or MRA. This recommendation follows 2010 ACCF/AHA/AATS/ACR/ASA/SCA/SCAI/SIR/STS/SVM Guidelines for the Diagnosis and Management of Patients with Thoracic Aortic Disease. Circulation. 2010; 121: W098-J191: E266-e369. 3. Moderate coronary artery calcific atherosclerosis. Electronically Signed: By: Mitzi HansenLance  Furusawa-Stratton M.D. On: 12/15/2017 20:52    Assessment/Plan: Will require anticoagulation for PE.  This will likely delay surgery on his neck.  Patient is improving.  Continue PT and Rehab.    LOS: 3 days    Dorian HeckleSTERN,Coree Riester D, MD 12/16/2017, 4:25 PM

## 2017-12-16 NOTE — Progress Notes (Signed)
[place] TEAM 1 - Stepdown/ICU TEAM  Franklin SpareCarl J Hicks  NWG:[id number] DOB: 12/10/1950 DOA: 12/13/2017 PCP: Shelva MajesticHunter, Stephen O, MD    Brief Narrative:  67yo M w/ a hx of obesity, systolic and diastolic CHF, DM, Gout, HTN, and CAD on plavix (DES in January of 2018) who presented after a fall in his bathtub striking his forehead resulting in lingering B UE severe weakness and discoordination.    Significant Events: 4/21 admit  Subjective: Pt reported some L sided pleuritic chest wall pain today.  He denies signif SOB, N/V, or abdom pain.  He feels that his UE strength continues to slowly improve.    Assessment & Plan:  Mechanical fall / central cord injury  No acute surgical need, though will likely require ACDF C45, C56, C67 eventually - for now cont w/ PT/OT - hopeful for CIR stay - care as per NS  Pleuritic chest pain Given risk factors will evaluate for PE - begin w/ D-dimer and if + will proceed w/ CTa chest - given recent significant fall/trauma (and ongoing use of Plavix)  Will transition to PO agent tomorow  Chronic combined systolic and diastolic heart failure TTE Feb 2018 noted EF 45% w/ grade 2 diastolic dysfunction - baseline wgt varies between 168 and 178kg - on minimal dose of lasix TIW as outpt - resumed home meds   Filed Weights   12/13/17 1418 12/14/17 0322 12/15/17 0500  Weight: (!) 171.5 kg (378 lb 1.4 oz) (!) 172.6 kg (380 lb 8.2 oz) (!) 173.1 kg (381 lb 9.9 oz)    CAD s/p DES ramus intermediius Jan 2018 Resumed Plavix - no acute sx   Diabetes diet controlled as outpt - CBG well controlled at this time despite decadron - follow w/o change today  - SSI  HTN BP controlled - follow trend   Morbid obesity - Body mass index is 51.76 kg/m.   Gout   DVT prophylaxis: SQ heparin  Code Status: FULL CODE Family Communication: spoke w/ wife at bedside again today Disposition Plan: awaiting CIR bed   Consultants:  Neurosurgery   Antimicrobials:   none  Objective: Blood pressure 132/65, pulse 73, temperature 97.9 F (36.6 C), temperature source Oral, resp. rate 19, height 6' (1.829 m), weight (!) 173.1 kg (381 lb 9.9 oz), SpO2 97 %.  Intake/Output Summary (Last 24 hours) at 12/16/2017 2210 Last data filed at 12/16/2017 1756 Gross per 24 hour  Intake 820.33 ml  Output 1300 ml  Net -479.67 ml   Filed Weights   12/13/17 1418 12/14/17 0322 12/15/17 0500  Weight: (!) 171.5 kg (378 lb 1.4 oz) (!) 172.6 kg (380 lb 8.2 oz) (!) 173.1 kg (381 lb 9.9 oz)    Examination: General: No acute respiratory distress; a&Ox3, neck brace Lungs: Clear to auscultation bilaterally w/o wheezing  Cardiovascular: RRR w/o M  Abdomen: Nontender, nondistended, soft, bowel sounds positive, no rebound, obese Extremities: trace B LE edema   CBC: Recent Labs  Lab 12/13/17 1050 12/13/17 1056 12/14/17 0432 12/16/17 0332  WBC 10.1  --  10.2 13.9*  HGB 13.9 15.0 13.5 13.8  HCT 42.9 44.0 41.2 42.7  MCV 91.7  --  90.4 90.7  PLT 173  --  202 206   Basic Metabolic Panel: Recent Labs  Lab 12/13/17 1050 12/13/17 1056 12/14/17 0432 12/16/17 0332  NA 137 140 134* 135  K 3.9 4.3 3.9 5.7*  CL 104 104 105 103  CO2 21*  --  20* 20*  GLUCOSE  173* 175* 158* 114*  BUN 23* 31* 16 21*  CREATININE 1.17 1.10 0.92 1.04  CALCIUM 8.6*  --  8.5* 8.4*   GFR: Estimated Creatinine Clearance: 111.3 mL/min (by C-G formula based on SCr of 1.04 mg/dL).  Liver Function Tests: Recent Labs  Lab 12/13/17 1050 12/14/17 0432  AST 31 34  ALT 22 24  ALKPHOS 89 83  BILITOT 0.8 0.7  PROT 7.3 6.9  ALBUMIN 3.3* 3.0*    Coagulation Profile: Recent Labs  Lab 12/13/17 1050  INR 1.08   HbA1C: Hgb A1c MFr Bld  Date/Time Value Ref Range Status  12/13/2017 10:50 AM 6.5 (H) 4.8 - 5.6 % Final    Comment:    (NOTE) Pre diabetes:          5.7%-6.4% Diabetes:              >6.4% Glycemic control for   <7.0% adults with diabetes   09/24/2017 03:04 PM 6.8 (H) 4.6 -  6.5 % Final    Comment:    Glycemic Control Guidelines for People with Diabetes:Non Diabetic:  <6%Goal of Therapy: <7%Additional Action Suggested:  >8%     CBG: Recent Labs  Lab 12/15/17 2131 12/16/17 0745 12/16/17 1225 12/16/17 1657 12/16/17 2131  GLUCAP 121* 125* 149* 129* 116*    Recent Results (from the past 240 hour(s))  MRSA PCR Screening     Status: None   Collection Time: 12/13/17  2:28 PM  Result Value Ref Range Status   MRSA by PCR NEGATIVE NEGATIVE Final    Comment:        The GeneXpert MRSA Assay (FDA approved for NASAL specimens only), is one component of a comprehensive MRSA colonization surveillance program. It is not intended to diagnose MRSA infection nor to guide or monitor treatment for MRSA infections. Performed at [facility], [address] [place], [place] [address]      Scheduled Meds: . atorvastatin  80 mg Oral q1800  . clopidogrel  75 mg Oral Daily  . docusate sodium  100 mg Oral BID  . furosemide  20 mg Oral Q M,W,F  . insulin aspart  0-15 Units Subcutaneous TID WC  . insulin aspart  0-5 Units Subcutaneous QHS  . mouth rinse  15 mL Mouth Rinse BID  . metoprolol tartrate  50 mg Oral BID  . multivitamin with minerals  1 tablet Oral Daily  . potassium citrate  10 mEq Oral TID  . terazosin  10 mg Oral QHS  . vitamin B-12  100 mcg Oral Daily     LOS: 3 days   Eliezer Bottom, MD [facility] Pager - Text Page per Loretha Stapler as per below:  On-Call/Text Page:      [email or web address]      password [email or web address]  If 7PM-7AM, please contact night-coverage [email or web address] Password [email or web address] 12/16/2017, 10:10 PM

## 2017-12-17 ENCOUNTER — Encounter (HOSPITAL_COMMUNITY): Payer: Self-pay | Admitting: General Practice

## 2017-12-17 ENCOUNTER — Encounter (HOSPITAL_COMMUNITY): Payer: Self-pay | Admitting: *Deleted

## 2017-12-17 ENCOUNTER — Inpatient Hospital Stay (HOSPITAL_COMMUNITY)
Admission: RE | Admit: 2017-12-17 | Discharge: 2018-01-13 | DRG: 945 | Disposition: A | Discharge status: Home Health | Payer: Medicare Other | Source: Intra-hospital | Attending: Physical Medicine & Rehabilitation | Admitting: Physical Medicine & Rehabilitation

## 2017-12-17 ENCOUNTER — Other Ambulatory Visit: Payer: Self-pay

## 2017-12-17 DIAGNOSIS — M4802 Spinal stenosis, cervical region: Secondary | ICD-10-CM | POA: Diagnosis present

## 2017-12-17 DIAGNOSIS — I2699 Other pulmonary embolism without acute cor pulmonale: Secondary | ICD-10-CM | POA: Diagnosis present

## 2017-12-17 DIAGNOSIS — I110 Hypertensive heart disease with heart failure: Secondary | ICD-10-CM | POA: Diagnosis present

## 2017-12-17 DIAGNOSIS — R4189 Other symptoms and signs involving cognitive functions and awareness: Secondary | ICD-10-CM | POA: Diagnosis present

## 2017-12-17 DIAGNOSIS — I10 Essential (primary) hypertension: Secondary | ICD-10-CM

## 2017-12-17 DIAGNOSIS — E119 Type 2 diabetes mellitus without complications: Secondary | ICD-10-CM | POA: Diagnosis present

## 2017-12-17 DIAGNOSIS — I252 Old myocardial infarction: Secondary | ICD-10-CM | POA: Diagnosis not present

## 2017-12-17 DIAGNOSIS — Z86711 Personal history of pulmonary embolism: Secondary | ICD-10-CM | POA: Diagnosis not present

## 2017-12-17 DIAGNOSIS — G8250 Quadriplegia, unspecified: Secondary | ICD-10-CM | POA: Diagnosis present

## 2017-12-17 DIAGNOSIS — W1830XD Fall on same level, unspecified, subsequent encounter: Secondary | ICD-10-CM

## 2017-12-17 DIAGNOSIS — S14129A Central cord syndrome at unspecified level of cervical spinal cord, initial encounter: Secondary | ICD-10-CM

## 2017-12-17 DIAGNOSIS — S14154D Other incomplete lesion at C4 level of cervical spinal cord, subsequent encounter: Secondary | ICD-10-CM | POA: Diagnosis not present

## 2017-12-17 DIAGNOSIS — S14104S Unspecified injury at C4 level of cervical spinal cord, sequela: Secondary | ICD-10-CM | POA: Diagnosis not present

## 2017-12-17 DIAGNOSIS — N400 Enlarged prostate without lower urinary tract symptoms: Secondary | ICD-10-CM | POA: Diagnosis present

## 2017-12-17 DIAGNOSIS — E669 Obesity, unspecified: Secondary | ICD-10-CM

## 2017-12-17 DIAGNOSIS — I2510 Atherosclerotic heart disease of native coronary artery without angina pectoris: Secondary | ICD-10-CM | POA: Diagnosis present

## 2017-12-17 DIAGNOSIS — Z79899 Other long term (current) drug therapy: Secondary | ICD-10-CM | POA: Diagnosis not present

## 2017-12-17 DIAGNOSIS — E1169 Type 2 diabetes mellitus with other specified complication: Secondary | ICD-10-CM

## 2017-12-17 DIAGNOSIS — S14129S Central cord syndrome at unspecified level of cervical spinal cord, sequela: Secondary | ICD-10-CM | POA: Diagnosis not present

## 2017-12-17 DIAGNOSIS — Z87891 Personal history of nicotine dependence: Secondary | ICD-10-CM | POA: Diagnosis not present

## 2017-12-17 DIAGNOSIS — Z6841 Body Mass Index (BMI) 40.0 and over, adult: Secondary | ICD-10-CM | POA: Diagnosis not present

## 2017-12-17 DIAGNOSIS — I5042 Chronic combined systolic (congestive) and diastolic (congestive) heart failure: Secondary | ICD-10-CM | POA: Diagnosis present

## 2017-12-17 DIAGNOSIS — M542 Cervicalgia: Secondary | ICD-10-CM | POA: Diagnosis present

## 2017-12-17 DIAGNOSIS — M79672 Pain in left foot: Secondary | ICD-10-CM

## 2017-12-17 DIAGNOSIS — Z9861 Coronary angioplasty status: Secondary | ICD-10-CM

## 2017-12-17 HISTORY — DX: Central cord syndrome at unspecified level of cervical spinal cord, initial encounter: S14.129A

## 2017-12-17 LAB — BASIC METABOLIC PANEL
Anion gap: 6 (ref 5–15)
Anion gap: 9 (ref 5–15)
BUN: 20 mg/dL (ref 6–20)
BUN: 19 mg/dL (ref 6–20)
CALCIUM: 8.4 mg/dL — AB (ref 8.9–10.3)
CHLORIDE: 103 mmol/L (ref 101–111)
CHLORIDE: 103 mmol/L (ref 101–111)
CO2: 27 mmol/L (ref 22–32)
CO2: 23 mmol/L (ref 22–32)
CREATININE: 1.03 mg/dL (ref 0.61–1.24)
CREATININE: 0.96 mg/dL (ref 0.61–1.24)
Calcium: 8.3 mg/dL — ABNORMAL LOW (ref 8.9–10.3)
GFR calc Af Amer: >60 mL/min (ref >60)
GFR calc non Af Amer: >60 mL/min (ref >60)
GLUCOSE: 113 mg/dL — AB (ref 65–99)
Glucose, Bld: 114 mg/dL — ABNORMAL HIGH (ref 65–99)
Potassium: 3.8 mmol/L (ref 3.5–5.1)
Potassium: 4 mmol/L (ref 3.5–5.1)
SODIUM: 136 mmol/L (ref 135–145)
SODIUM: 135 mmol/L (ref 135–145)

## 2017-12-17 LAB — HEPARIN LEVEL (UNFRACTIONATED)
Heparin Unfractionated: 0.81 IU/mL — ABNORMAL HIGH (ref 0.30–0.70)
Heparin Unfractionated: 0.92 IU/mL — ABNORMAL HIGH (ref 0.30–0.70)
Heparin Unfractionated: 0.6 IU/mL (ref 0.30–0.70)

## 2017-12-17 LAB — GLUCOSE, CAPILLARY
GLUCOSE-CAPILLARY: 101 mg/dL — AB (ref 65–99)
Glucose-Capillary: 127 mg/dL — ABNORMAL HIGH (ref 65–99)
Glucose-Capillary: 160 mg/dL — ABNORMAL HIGH (ref 65–99)
Glucose-Capillary: 126 mg/dL — ABNORMAL HIGH (ref 65–99)

## 2017-12-17 MED ORDER — PROCHLORPERAZINE EDISYLATE 10 MG/2ML IJ SOLN: 5.0000 mg | Freq: Four times a day (QID) | INTRAMUSCULAR | Status: DC | PRN

## 2017-12-17 MED ORDER — METOPROLOL TARTRATE 50 MG PO TABS
50.0000 mg | ORAL_TABLET | Freq: Two times a day (BID) | ORAL | Status: DC
  Administered 2017-12-17 – 2018-01-13 (×54): 50 mg via ORAL
  Filled 2017-12-17 (×8): qty 1
  Filled 2017-12-17: qty 2
  Filled 2017-12-17 (×45): qty 1

## 2017-12-17 MED ORDER — POTASSIUM CITRATE ER 10 MEQ (1080 MG) PO TBCR
10.0000 meq | EXTENDED_RELEASE_TABLET | Freq: Three times a day (TID) | ORAL | Status: DC
  Administered 2017-12-17 – 2018-01-13 (×80): 10 meq via ORAL
  Filled 2017-12-17 (×85): qty 1

## 2017-12-17 MED ORDER — BISACODYL 10 MG RE SUPP: 10.0000 mg | Freq: Every day | RECTAL | Status: DC | PRN

## 2017-12-17 MED ORDER — CLOPIDOGREL BISULFATE 75 MG PO TABS
75.0000 mg | ORAL_TABLET | Freq: Every day | ORAL | Status: DC
  Administered 2017-12-18 – 2018-01-13 (×27): 75 mg via ORAL
  Filled 2017-12-17 (×28): qty 1

## 2017-12-17 MED ORDER — ACETAMINOPHEN 325 MG PO TABS: 650.0000 mg | ORAL_TABLET | Freq: Four times a day (QID) | ORAL | 0 refills | Status: DC | PRN

## 2017-12-17 MED ORDER — TERAZOSIN HCL 10 MG PO CAPS: 10.0000 mg | ORAL_CAPSULE | Freq: Every day | ORAL | 0 refills | Status: DC

## 2017-12-17 MED ORDER — HEPARIN (PORCINE) IN NACL 100-0.45 UNIT/ML-% IJ SOLN: 1900.0000 [IU]/h | INTRAMUSCULAR | 0 refills | Status: DC

## 2017-12-17 MED ORDER — POTASSIUM CITRATE ER 10 MEQ (1080 MG) PO TBCR: 10.0000 meq | EXTENDED_RELEASE_TABLET | Freq: Three times a day (TID) | ORAL | 0 refills | Status: DC

## 2017-12-17 MED ORDER — ACETAMINOPHEN 325 MG PO TABS: 325.0000 mg | ORAL_TABLET | ORAL | Status: DC | PRN

## 2017-12-17 MED ORDER — PROCHLORPERAZINE 25 MG RE SUPP: 12.5000 mg | Freq: Four times a day (QID) | RECTAL | Status: DC | PRN

## 2017-12-17 MED ORDER — INSULIN ASPART 100 UNIT/ML ~~LOC~~ SOLN: 0.0000 [IU] | Freq: Every day | SUBCUTANEOUS | Status: DC

## 2017-12-17 MED ORDER — GUAIFENESIN-DM 100-10 MG/5ML PO SYRP
5.0000 mL | ORAL_SOLUTION | Freq: Four times a day (QID) | ORAL | Status: DC | PRN
  Filled 2017-12-17: qty 10

## 2017-12-17 MED ORDER — ATORVASTATIN CALCIUM 80 MG PO TABS
80.0000 mg | ORAL_TABLET | Freq: Every day | ORAL | Status: DC
  Administered 2017-12-18 – 2018-01-12 (×26): 80 mg via ORAL
  Filled 2017-12-17 (×27): qty 1

## 2017-12-17 MED ORDER — FLEET ENEMA 7-19 GM/118ML RE ENEM: 1.0000 | ENEMA | Freq: Once | RECTAL | Status: DC | PRN

## 2017-12-17 MED ORDER — ADULT MULTIVITAMIN W/MINERALS CH
1.0000 | ORAL_TABLET | Freq: Every day | ORAL | Status: DC
  Administered 2017-12-18 – 2018-01-13 (×27): 1 via ORAL
  Filled 2017-12-17 (×27): qty 1

## 2017-12-17 MED ORDER — DOCUSATE SODIUM 100 MG PO CAPS: 100.0000 mg | ORAL_CAPSULE | Freq: Two times a day (BID) | ORAL | 0 refills | Status: DC

## 2017-12-17 MED ORDER — CLOPIDOGREL BISULFATE 75 MG PO TABS: 75.0000 mg | ORAL_TABLET | Freq: Every day | ORAL | 0 refills | Status: DC

## 2017-12-17 MED ORDER — ALUM & MAG HYDROXIDE-SIMETH 200-200-20 MG/5ML PO SUSP: 30.0000 mL | ORAL | Status: DC | PRN

## 2017-12-17 MED ORDER — TERAZOSIN HCL 5 MG PO CAPS
10.0000 mg | ORAL_CAPSULE | Freq: Every day | ORAL | Status: DC
  Administered 2017-12-17 – 2018-01-12 (×27): 10 mg via ORAL
  Filled 2017-12-17 (×27): qty 2

## 2017-12-17 MED ORDER — POLYETHYLENE GLYCOL 3350 17 G PO PACK: 17.0000 g | PACK | Freq: Every day | ORAL | Status: DC | PRN

## 2017-12-17 MED ORDER — PROCHLORPERAZINE MALEATE 5 MG PO TABS: 5.0000 mg | ORAL_TABLET | Freq: Four times a day (QID) | ORAL | Status: DC | PRN

## 2017-12-17 MED ORDER — FUROSEMIDE 20 MG PO TABS
20.0000 mg | ORAL_TABLET | ORAL | Status: DC
  Administered 2017-12-18 – 2018-01-13 (×12): 20 mg via ORAL
  Filled 2017-12-17 (×12): qty 1

## 2017-12-17 MED ORDER — HEPARIN (PORCINE) IN NACL 100-0.45 UNIT/ML-% IJ SOLN
1600.0000 [IU]/h | INTRAMUSCULAR | Status: DC
  Administered 2017-12-17 – 2017-12-18 (×2): 1600 [IU]/h via INTRAVENOUS
  Filled 2017-12-17: qty 250

## 2017-12-17 MED ORDER — ADULT MULTIVITAMIN W/MINERALS CH: 1.0000 | ORAL_TABLET | Freq: Every day | ORAL | 0 refills | Status: DC

## 2017-12-17 MED ORDER — DOCUSATE SODIUM 100 MG PO CAPS
200.0000 mg | ORAL_CAPSULE | Freq: Every day | ORAL | Status: DC
  Administered 2017-12-18: 200 mg via ORAL
  Administered 2017-12-20: 100 mg via ORAL
  Administered 2017-12-21 – 2018-01-11 (×22): 200 mg via ORAL
  Filled 2017-12-17 (×29): qty 2

## 2017-12-17 MED ORDER — HYDROCODONE-ACETAMINOPHEN 5-325 MG PO TABS: 1.0000 | ORAL_TABLET | Freq: Four times a day (QID) | ORAL | Status: DC | PRN

## 2017-12-17 MED ORDER — INSULIN ASPART 100 UNIT/ML ~~LOC~~ SOLN
0.0000 [IU] | Freq: Three times a day (TID) | SUBCUTANEOUS | Status: DC
  Administered 2017-12-18 – 2017-12-20 (×3): 2 [IU] via SUBCUTANEOUS
  Administered 2017-12-21: 3 [IU] via SUBCUTANEOUS
  Administered 2017-12-21 – 2017-12-22 (×3): 2 [IU] via SUBCUTANEOUS
  Administered 2017-12-23: 3 [IU] via SUBCUTANEOUS
  Administered 2017-12-24 (×2): 2 [IU] via SUBCUTANEOUS
  Administered 2017-12-25: 3 [IU] via SUBCUTANEOUS
  Administered 2017-12-25 – 2018-01-04 (×16): 2 [IU] via SUBCUTANEOUS
  Administered 2018-01-06 (×2): 1 [IU] via SUBCUTANEOUS
  Administered 2018-01-08 – 2018-01-11 (×5): 2 [IU] via SUBCUTANEOUS

## 2017-12-17 MED ORDER — DIPHENHYDRAMINE HCL 12.5 MG/5ML PO ELIX: 12.5000 mg | ORAL_SOLUTION | Freq: Four times a day (QID) | ORAL | Status: DC | PRN

## 2017-12-17 MED ORDER — VITAMIN B-12 100 MCG PO TABS
100.0000 ug | ORAL_TABLET | Freq: Every day | ORAL | Status: DC
  Administered 2017-12-18 – 2018-01-13 (×27): 100 ug via ORAL
  Filled 2017-12-17 (×27): qty 1

## 2017-12-17 MED ORDER — TRAZODONE HCL 50 MG PO TABS: 25.0000 mg | ORAL_TABLET | Freq: Every evening | ORAL | Status: DC | PRN

## 2017-12-17 MED ORDER — ONDANSETRON HCL 4 MG PO TABS: 4.0000 mg | ORAL_TABLET | Freq: Four times a day (QID) | ORAL | 0 refills | Status: DC | PRN

## 2017-12-17 MED ORDER — METOPROLOL TARTRATE 50 MG PO TABS: 50.0000 mg | ORAL_TABLET | Freq: Two times a day (BID) | ORAL | 0 refills | Status: DC

## 2017-12-17 NOTE — PMR Pre-admission (Signed)
PMR Admission Coordinator Pre-Admission Assessment  Patient: Franklin Hicks is an 67 y.o., male MRN: [id number] DOB: 1951/01/31 Height: 6' (182.9 cm) Weight: (!) 173.1 kg (381 lb 9.9 oz)              Insurance Information HMO:    PPO: Yes     PCP:       IPA:       80/20:       OTHER:   PRIMARY: UHC Medicare      Policy#: [id number]      Subscriber:  [employer] CM Name: Vevelyn Royals      Phone#: [phone]     Fax#: [phone] Pre-Cert#: [id number]      Employer:  Retired Benefits:  Phone #: [phone]     Name:  On line portal Eff. Date: 08/25/17     Deduct:  $0      Out of Pocket Max: $4000 (met $0)      Life Max: N/A CIR: $160 days 1-10      SNF: $0 days 1-20; $50 days 21-100 Outpatient: Medical necessity     Co-Pay: $20/visit Home Health: 100%      Co-Pay: none DME: 80%     Co-Pay: 20% Providers: in network  Medicaid Application Date:        Case Manager:   Disability Application Date:        Case Worker:    Emergency Facilities manager Information    Name Relation Home Work Mobile   Morand,Sandra Spouse [phone]  [phone]   Ruthine Dose Daughter   [phone]     Current Medical History  Patient Admitting Diagnosis: C6 spinal cord injury motor and sensory incomplete, left more affected than right  History of Present Illness: A 67 year old male with history of CAD with chronic combined CHF, T2DM, morbid obesity, hypertension, PE in the past;  who was admitted on 12/13/2017 after falling forwards in the bathroom with inability to move.  He reported numbness and tingling bilateral hands as well as bilateral lower extremities.  MRI of cervical spine done showing multilevel DJD with moderate canal stenosis C4-5, severe left C5-6 and severe right stenosis C6-7 with increase in cord signal at C4.  He was evaluated by Dr. Vertell Limber who recommended steroids to help manage central cord injury as well as extensive rehab and collar for stabilization.  He developed pleuritic  chest pain on 4-23 and was found to have acute PE and right main pulmonary artery extending to multiple segmental arteries and stable 4 cm ascending aortic aneurysm.  He was started on IV heparin for treatment.  NS to discuss ACDF after rehab complete?  Therapy ongoing and working on pregait activity. CIR recommended due to functional deficits.   Past Medical History  Past Medical History:  Diagnosis Date  . Arthritis    "knees" (12/17/2017)  . CAD S/P percutaneous coronary angioplasty 09/20/2016   Nstemi 08/2016. Dr. Doylene Canard. DES- brilinta and asa. Requests change to [facility] cardiology; 95% Ramus -> PCI Resolute Onyx DES 2.75 x 18  . Chronic combined systolic and diastolic heart failure ([place]) 10/09/2016   EF 45% and grade II diastolic after nstemi  . Diet-controlled diabetes mellitus ([place])   . DJD (degenerative joint disease)   . Gout   . High cholesterol   . Hypertension   . NSTEMI (non-ST elevated myocardial infarction) ([place]) 09/20/2016   95% Ramus - > PCI   . Obesity   . Pulmonary embolism ([place]) ~ 2015  Family History  family history includes Liver disease in his mother.  Prior Rehab/Hospitalizations: No previous rehab.  Has the patient had major surgery during 100 days prior to admission? No  Current Medications   Current Facility-Administered Medications:  .  acetaminophen (TYLENOL) tablet 650 mg, 650 mg, Oral, Q6H PRN **OR** acetaminophen (TYLENOL) suppository 650 mg, 650 mg, Rectal, Q6H PRN, Black, Karen M, NP .  atorvastatin (LIPITOR) tablet 80 mg, 80 mg, Oral, q1800, Black, Karen M, NP, 80 mg at 12/16/17 1717 .  clopidogrel (PLAVIX) tablet 75 mg, 75 mg, Oral, Daily, Cherene Altes, MD, 75 mg at 12/17/17 0913 .  docusate sodium (COLACE) capsule 100 mg, 100 mg, Oral, BID, Black, Karen M, NP, 100 mg at 12/17/17 0913 .  fentaNYL (SUBLIMAZE) injection 25-50 mcg, 25-50 mcg, Intravenous, Q2H PRN, Geradine Girt, DO, 25 mcg at 12/13/17 1819 .  furosemide (LASIX) tablet 20 mg,  20 mg, Oral, Q M,W,F, Joette Catching T, MD, 20 mg at 12/16/17 0946 .  heparin ADULT infusion 100 units/mL (25000 units/277m sodium chloride 0.45%), 1,900 Units/hr, Intravenous, Continuous, [facility] Last Rate: 19 mL/hr at 12/17/17 0916, 1,900 Units/hr at 12/17/17 0916 .  hydrALAZINE (APRESOLINE) injection 10 mg, 10 mg, Intravenous, Q6H PRN, Bodenheimer, Charles A, NP .  HYDROcodone-acetaminophen (NORCO/VICODIN) 5-325 MG per tablet 1-2 tablet, 1-2 tablet, Oral, Q6H PRN, Bodenheimer, Charles A, NP, 1 tablet at 12/14/17 1726 .  insulin aspart (novoLOG) injection 0-15 Units, 0-15 Units, Subcutaneous, TID WC, MCherene Altes MD, 2 Units at 12/17/17 [phone].  insulin aspart (novoLOG) injection 0-5 Units, 0-5 Units, Subcutaneous, QHS, McClung, JKimberlee Nearing MD .  MEDLINE mouth rinse, 15 mL, Mouth Rinse, BID, MCherene Altes MD, 15 mL at 12/17/17 0921 .  metoprolol tartrate (LOPRESSOR) tablet 50 mg, 50 mg, Oral, BID, Vann, Jessica U, DO, 50 mg at 12/17/17 0914 .  multivitamin with minerals tablet 1 tablet, 1 tablet, Oral, Daily, MCherene Altes MD, 1 tablet at 12/17/17 0913 .  ondansetron (ZOFRAN) tablet 4 mg, 4 mg, Oral, Q6H PRN **OR** ondansetron (ZOFRAN) injection 4 mg, 4 mg, Intravenous, Q6H PRN, Black, Karen M, NP .  potassium citrate (UROCIT-K) SR tablet 10 mEq, 10 mEq, Oral, TID, MCherene Altes MD, 10 mEq at 12/17/17 0913 .  terazosin (HYTRIN) capsule 10 mg, 10 mg, Oral, QHS, Black, Karen M, NP, 10 mg at 12/16/17 2301 .  vitamin B-12 (CYANOCOBALAMIN) tablet 100 mcg, 100 mcg, Oral, Daily, MCherene Altes MD, 100 mcg at 12/17/17 0913  Patients Current Diet: Diet Carb Modified Fluid consistency: Thin; Room service appropriate? Yes Diet - low sodium heart healthy  Precautions / Restrictions Precautions Precautions: Cervical Precaution Comments: reviewed cervical precautions  Cervical Brace: Hard collar Restrictions Weight Bearing Restrictions: No   Has the  patient had 2 or more falls or a fall with injury in the past year?No.  However patient had 1 fall in the bathtub that resulted in acute hospitalization and then current admission to inpatient rehab.  Prior Activity Level Community (5-7x/wk): Went out several times each day.  Was driving.  Home Assistive Devices / Equipment Home Assistive Devices/Equipment: Cane (specify quad or straight), Eyeglasses, Grab bars in shower Home Equipment: Cane - single point, Grab bars - tub/shower  Prior Device Use: Indicate devices/aids used by the patient prior to current illness, exacerbation or injury? 2 canes used together.  Prior Functional Level Prior Function Level of Independence: Needs assistance Gait / Transfers Assistance Needed: walking with 2 canes  all the time ADL's / Homemaking Assistance Needed: bathed and dressed independently Comments: drives, retired Tourist information centre manager (taught PE)  Self Care: Did the patient need help bathing, dressing, using the toilet or eating?  Independent  Indoor Mobility: Did the patient need assistance with walking from room to room (with or without device)? Independent  Stairs: Did the patient need assistance with internal or external stairs (with or without device)? Independent  Functional Cognition: Did the patient need help planning regular tasks such as shopping or remembering to take medications? Independent  Current Functional Level Cognition  Overall Cognitive Status: Within Functional Limits for tasks assessed Orientation Level: Oriented X4 General Comments: No apparent cognitive deficits noted     Extremity Assessment (includes Sensation/Coordination)  Upper Extremity Assessment: Defer to OT evaluation RUE Deficits / Details: shoulder 3-/5 flexion and abduction; elbow flex/ext 3/5; forearm 3-/5; wrist 3+/5; hand flex/ext 3+/5.  Able to oppose digit 2  RUE Sensation: (intact to light touch ) RUE Coordination: decreased fine motor, decreased gross  motor LUE Deficits / Details: shoulder 3-/5 flex and abd; elbow flex 3-/5, ext 3/5, forearm 3-/5; wrist 3/5; hand 1/5 except thumb 2/5  LUE Sensation: (intact LT ) LUE Coordination: decreased fine motor, decreased gross motor  Lower Extremity Assessment: RLE deficits/detail, LLE deficits/detail RLE Deficits / Details: right leg is stronger than left leg with ankle 4/5, knee at least 3/5 seated extension and hip flexion at least 3-/5.  RLE Sensation: WNL LLE Deficits / Details: Pt with 2+/5 DF, PF, knee weak quad 2+/5 grossly, hip flexion (unable to lift leg agaist gravity).  At baseline pt has a "bad knee" with limited knee flexion ROM on this side, however, since he fell he is reporting some posterior/lateral knee and tigh pain (he reports he was positioned akwardly with one leg in and one leg out of the tub).  This will need to be monitored to ensure no further follow up is needed.  LLE Sensation: WNL    ADLs  Overall ADL's : Needs assistance/impaired Eating/Feeding: Moderate assistance, With adaptive utensils, Sitting Eating/Feeding Details (indicate cue type and reason): Pt provided with foam to build up handles of utensils as well as a lidded mug.  He was able to pick up and drink from cup with supervision, but requires min A to release mug.  He requires mod A to self feed with utensils at this time  Grooming: Wash/dry face, Moderate assistance, Sitting Grooming Details (indicate cue type and reason): mod A to wash face  Upper Body Bathing: Maximal assistance, Sitting Lower Body Bathing: Total assistance, Bed level Upper Body Dressing : Total assistance, Sitting Lower Body Dressing: Total assistance, Sit to/from stand Toilet Transfer: Maximal assistance, +2 for physical assistance, Stand-pivot, BSC Toilet Transfer Details (indicate cue type and reason): utilized stedy for transfer  Toileting- Water quality scientist and Hygiene: Total assistance, +2 for physical assistance, Bed  level Functional mobility during ADLs: Maximal assistance, +2 for physical assistance    Mobility  Overal bed mobility: Needs Assistance Bed Mobility: Rolling Rolling: Mod assist, +2 for physical assistance Sidelying to sit: Mod assist, +2 for physical assistance Sit to sidelying: Max assist, +2 for physical assistance, +2 for safety/equipment General bed mobility comments: Assisted trunk and L leg to roll and trunk to get up.    Transfers  Overall transfer level: Needs assistance Equipment used: Ambulation equipment used Transfer via Lift Equipment: Letta Median Transfers: Sit to/from Stand Sit to Stand: Max assist(+3) Stand pivot transfers: Max assist, +2 physical assistance, +  2 safety/equipment General transfer comment: attempted transfer by stedy x1 with 3 person assist, but the pt unable to get his L LE under him from such a low position.  So used maximove to transfer to the bed.    Ambulation / Gait / Stairs / Wheelchair Mobility  Ambulation/Gait General Gait Details: unable at this time.     Posture / Balance Dynamic Sitting Balance Sitting balance - Comments: able to maintain EOB sitting with supervision.  Able to reciprocally scoot hips to EOB  Balance Overall balance assessment: Needs assistance Sitting-balance support: Feet supported, Single extremity supported, Bilateral upper extremity supported Sitting balance-[name]: Fair Sitting balance - Comments: able to maintain EOB sitting with supervision.  Able to reciprocally scoot hips to EOB  Standing balance support: Bilateral upper extremity supported, During functional activity Standing balance-[name]: Poor Standing balance comment: pt stood in STEDY x3. Second trial, pt stepped his left LE up under him and worked on w/shifts for 25 secs.  I sitting in the STEDY,, pt worked on modified arm pushups.    Special needs/care consideration BiPAP/CPAP NO CPM No Continuous Drip IV Heparin drip Dialysis No         Life Vest No Oxygen No Special Bed No Trach Size No Wound Vac (area) No     Skin:  Hard cervical collar                            Bowel mgmt: Last BM 12/16/17 Bladder mgmt: External catheter Diabetic mgmt Utilizing diet control, but no medications at home    Previous Home Environment Living Arrangements: Spouse/significant other, Other relatives Available Help at Discharge: Family Type of Home: House Home Layout: Multi-level, Able to live on main level with bedroom/bathroom, Laundry or work area in basement Alternate Therapist, sports of Steps: 6 Home Access: Stairs to enter [employer] of Steps: 6(2 in the back to get onto main level wiht bedroom) Bathroom Shower/Tub: Chiropodist: [place]: No Additional Comments: back entrance with 2 steps that would get you to the main level where the kitchen and bed/bath are if it is not muddy they can pull the car around to the steps.   Discharge Living Setting Plans for Discharge Living Setting: Patient's home, House, Lives with (comment)(Lives with wife, sister in law and 2 Great Grand kids.) Type of Home at Discharge: House Discharge Home Layout: Two level, Able to live on main level with bedroom/bathroom(Split level home.) Alternate Level Stairs-Number of Steps: 6 steps up to main level and 6 steps down to basement from foyer area. Discharge Home Access: Stairs to enter Entrance Stairs-Number of Steps: 6 steps.  Back entry with 2 steps Does the patient have any problems obtaining your medications?: No  Social/Family/Support Systems Patient Roles: Spouse, Parent, Other (Comment)(Has a wife, daughter, grand daughters, 2 great grand children) Contact Information: Notnamed Scholz - spouse - [phone] Anticipated Caregiver: Wife, daughter, additional family Anticipated Caregiver's Contact Information: Ruthine Dose - daughter - [phone] Ability/Limitations of Caregiver: Wife and  sister-in-law are retired and can assist.  Other family can assist as needed. Caregiver Availability: 24/7 Discharge Plan Discussed with Primary Caregiver: Yes Is Caregiver In Agreement with Plan?: Yes Does Caregiver/Family have Issues with Lodging/Transportation while Pt is in Rehab?: No  Goals/Additional Needs Patient/Family Goal for Rehab: PT/OT supervision to min to mod assist goals Expected length of stay: 20-27 days Cultural Considerations: [employer] Dietary Needs: Carb mod, med  cal, thin liquids Equipment Needs: TBD Pt/Family Agrees to Admission and willing to participate: Yes Program Orientation Provided & Reviewed with Pt/Caregiver Including Roles  & Responsibilities: Yes  Decrease burden of Care through IP rehab admission: N/A  Possible need for SNF placement upon discharge: Yes, but family hopeful for good recovery so that they can take patient home  Patient Condition: This patient's medical and functional status has changed since the consult dated: 12/14/17 in which the Rehabilitation Physician determined and documented that the patient's condition is appropriate for intensive rehabilitative care in an inpatient rehabilitation facility. See "History of Present Illness" (above) for medical update. Functional changes are:  Currently requiring Max +2 to total assist +2-3 for mobility and ADLs. Patient's medical and functional status update has been discussed with the Rehabilitation physician and patient remains appropriate for inpatient rehabilitation. Will admit to inpatient rehab today.  Preadmission Screen Completed By:  Retta Diones, 12/17/2017 12:06 PM ______________________________________________________________________   Discussed status with Dr. Naaman Plummer on 12/17/17 at 1206 and received telephone approval for admission today.  Admission Coordinator:  Retta Diones, time 1206/Date 12/17/17

## 2017-12-17 NOTE — Progress Notes (Signed)
Rehab admissions [facility].  Noted patient developed PE.  I will check with attending MD for medical readiness.  I do have a rehab bed available today if patient is medically ready.  Call me for questions.  #[phone]

## 2017-12-17 NOTE — Progress Notes (Signed)
Franklin Hicks, Franklin T, MD  Physician  Physical Medicine and Rehabilitation  Consult Note  Signed  Date of Service:  12/14/2017 9:19 AM       Related encounter: ED to Hosp-Admission (Current) from 12/13/2017 in [place] [facility]      Signed      Expand All Collapse All       Show:Clear all [x] Manual[x] Template[] Copied  Added by: [x] Love, Evlyn KannerPamela S, PA-C[x] Franklin Hicks, Franklin T, MD   [] Hover for details        Physical Medicine and Rehabilitation Consult   Reason for Consult:  Central cord injury Referring Physician:  Dr. Venetia MaxonStern   HPI: Franklin Hicks is a 67 y.o. male with history of CAD, T2DM, HTN who was admitted on 12/13/17 after falling forwards in the bathroom with inability to move. He reported numbness and tingling bilateral hands and BLE. MRI Cervical spins showed  multilevel DDD with moderate canal stenosis C4/5, severe left C5/6 and severe right stenosis C6/7 with increase in cord signal C4.  He was evaluated by Dr. Venetia MaxonStern who recommended steroids to help manage central cord injury with extensive rehab for central cord injury and collar for stabilization.   Patient reports that he was independent for ADLs and ambulated with 2 canes. He reports improvement in strength and coordination RUE> LUE . Therapy evaluations to be completed today. CIR recommended due to rehab needs and functional deficits.    Review of Systems  Constitutional: Negative for chills and fever.  HENT: Negative for hearing loss and tinnitus.   Eyes: Negative for blurred vision and double vision.  Respiratory: Negative for cough and hemoptysis.   Cardiovascular: Negative for chest pain and palpitations.  Gastrointestinal: Negative for abdominal pain, constipation, heartburn and nausea.  Genitourinary: Negative for dysuria and urgency.  Musculoskeletal: Positive for back pain and myalgias (knees).  Skin: Negative for itching and rash.  Neurological: Positive for sensory  change and focal weakness. Negative for dizziness and headaches.  Psychiatric/Behavioral: Negative for memory loss. The patient does not have insomnia.          Past Medical History:  Diagnosis Date  . CAD S/P percutaneous coronary angioplasty 09/20/2016   Nstemi 08/2016. Dr. Algie CofferKadakia. DES- brilinta and asa. [facility]; 95% Ramus -> PCI Resolute Onyx DES 2.75 x 18  . Chronic combined systolic and diastolic heart failure (HCC) 10/09/2016   EF 45% and grade II diastolic after nstemi  . Diabetes mellitus without complication (HCC)   . DJD (degenerative joint disease)   . Gout   . H/O non-ST elevation myocardial infarction (NSTEMI) 09/20/2016   95% Ramus - > PCI   . Hypertension   . Obesity          Past Surgical History:  Procedure Laterality Date  . CARDIAC CATHETERIZATION N/A 09/19/2016   Procedure: Left Heart Cath and Coronary Angiography;  Surgeon: Orpah CobbAjay Kadakia, MD;  Location: [facility] INVASIVE CV LAB;  Service: Cardiovascular: 95% proximal Ramus Intermedius --> PCI  . CARDIAC CATHETERIZATION N/A 09/19/2016   Procedure: Coronary Stent Intervention;  Surgeon: Yvonne Kendallhristopher End, MD;  [facility];  Service: Cardiovascular: 95% ramus intermedius;  Resolute Onyx 2.75 x 18 mm drug-eluting stent  . left knee open meniscetomy    . TRANSTHORACIC ECHOCARDIOGRAM  2004   no lvh nl ejection fraction  . TRANSTHORACIC ECHOCARDIOGRAM  09/19/2016   In setting of NSTEMI:  EF 45-50% with diffuse hypokinesis. GR 2 DD. Mild biatrial enlargement.  . ureteral  stone  4098,1191         Family History  Problem Relation Age of Onset  . Liver disease Mother     Social History:  Married. Retired. Relative at home does most of home management/meals.  He reports that he quit smoking about 40 years ago. His smoking use included cigarettes. He has a 13.00 pack-year smoking history. He has never used smokeless tobacco. He reports that he drinks alcohol. He  reports that he does not use drugs.         Allergies  Allergen Reactions  . Ace Inhibitors     Angioedema  . Other Hives and Itching    msg          Medications Prior to Admission  Medication Sig Dispense Refill  . atorvastatin (LIPITOR) 80 MG tablet TAKE 1 TABLET (80 MG TOTAL) BY MOUTH DAILY AT 6 PM. 30 tablet 3  . Chromium 200 MCG CAPS Take 200 mcg by mouth daily.     Marland Kitchen CINNAMON PO Take 1 capsule by mouth daily.    . clopidogrel (PLAVIX) 75 MG tablet Take 1 tablet (75 mg total) by mouth daily. 30 tablet 11  . furosemide (LASIX) 20 MG tablet Take 1 tablet (20 mg total) by mouth every Monday, Wednesday, and Friday. 15 tablet 3  . Krill Oil 1000 MG CAPS Take 1,000 mg by mouth daily.     . metoprolol tartrate (LOPRESSOR) 50 MG tablet Take 1 tablet (50 mg total) by mouth 2 (two) times daily. 60 tablet 3  . Multiple Vitamin (MULTIVITAMIN WITH MINERALS) TABS tablet Take 1 tablet by mouth daily.    . Potassium Citrate 15 MEQ (1620 MG) TBCR Take 1 tablet by mouth 2 (two) times daily.  11  . terazosin (HYTRIN) 10 MG capsule TAKE 1 CAPSULE (10 MG TOTAL) BY MOUTH AT BEDTIME. (Patient taking differently: Take 10 mg by mouth at bedtime. TAKE 1 CAPSULE (10 MG TOTAL) BY MOUTH AT BEDTIME.) 30 capsule 5  . vitamin B-12 (CYANOCOBALAMIN) 100 MCG tablet Take 100 mcg by mouth daily.      Home:    Functional History: Functional Status:  Mobility:  ADL:  Cognition: Cognition Orientation Level: Oriented X4  Blood pressure (!) 160/95, pulse 86, temperature 98.5 F (36.9 C), temperature source Oral, resp. rate (!) 26, height 6' (1.829 m), weight (!) 172.6 kg (380 lb 8.2 oz), SpO2 95 %. Physical Exam  Nursing note and vitals reviewed. Constitutional: He is oriented to person, place, and time. He appears well-developed and well-nourished. No distress.  Morbidly obese male in NAD. Lying in bed and  being fed by daughter.   HENT:  Head: Normocephalic and atraumatic.    Mouth/Throat: Oropharynx is clear and moist.  Eyes: Pupils are equal, round, and reactive to light. Conjunctivae and EOM are normal.  Neck:  Cervical collar in place.   Cardiovascular: Normal rate and regular rhythm.  Respiratory: Effort normal and breath sounds normal. No stridor. No respiratory distress. He has no wheezes.  GI: Soft. Bowel sounds are normal.  Musculoskeletal: He exhibits edema.  Neurological: He is alert and oriented to person, place, and time. Coordination abnormal.  Speech clear.  He is able to answer orientation questions and follow basic commands without difficulty. RUE 5/5 biceps, shoulder, 3+ wrist ext and triceps, 2 H I. LUE: 5/5 biceps, deltoid, 1- 2/5 wrist/triceps, 1/5 HI. LE: 1 to 1+/5 prox to distal. RLE: 2 to 2+/5 prox to distal. Decreased LT left hand, legs in  inconsistent distribution. DTR's 3+ bilateral knees, 1/4 resting tone in legs  Skin: Skin is warm and dry. He is not diaphoretic.  Psychiatric: He has a normal mood and affect. His behavior is normal. Thought content normal.             Assessment/Plan: Diagnosis: C6 spinal cord injury motor and sensory incomplete, left more affected than right 1. Does the need for close, 24 hr/day medical supervision in concert with the patient's rehab needs make it unreasonable for this patient to be served in a less intensive setting? Yes 2. Co-Morbidities requiring supervision/potential complications: CAD, dm2, morbid obesity, neurogenic bowel and bladder 3. Due to bladder management, bowel management, safety, skin/wound care, disease management, medication administration, pain management and patient education, does the patient require 24 hr/day rehab nursing? Yes 4. Does the patient require coordinated care of a physician, rehab nurse, PT (1-2 hrs/day, 5 days/week) and OT (1-2 hrs/day, 5 days/week) to address physical and functional deficits in the context of the above medical diagnosis(es)? Yes Addressing  deficits in the following areas: balance, endurance, locomotion, strength, transferring, bowel/bladder control, bathing, dressing, feeding, grooming, toileting and psychosocial support 5. Can the patient actively participate in an intensive therapy program of at least 3 hrs of therapy per day at least 5 days per week? Yes 6. The potential for patient to make measurable gains while on inpatient rehab is excellent 7. Anticipated functional outcomes upon discharge from inpatient rehab are supervision, min assist and mod assist  with PT, supervision, min assist and mod assist with OT, n/a with SLP. 8. Estimated rehab length of stay to reach the above functional goals is: 20-27 days 9. Anticipated D/C setting: Home 10. Anticipated post D/C treatments: HH therapy 11. Overall Rehab/Functional Prognosis: excellent  RECOMMENDATIONS: This patient's condition is appropriate for continued rehabilitative care in the following setting: CIR Patient has agreed to participate in recommended program. Yes Note that insurance prior authorization may be required for reimbursement for recommended care.  Comment: Rehab Admissions Coordinator to follow up.  Thanks,  Franklin Oyster, MD, [place] Dom      Jacquelynn Cree, PA-C 12/14/2017          Revision History              Routing History

## 2017-12-17 NOTE — Care Management Note (Signed)
Case Management Note  Patient Details  Name: Franklin SpareCarl J Tyner MRN: [id number] Date of Birth: 05/24/1951  Subjective/Objective:   This 67 y.o. male admitted after falling and striking head in bathroom while attempting to step into tub.  Cervical MRI showed multi level DDD with moderate canal stenosis C4/5, severe Lt C5/6, and severe Rt stenosis C6/7 with increased cord signal C4.  PTA, pt independent with assistive devices; lives with spouse.                    Action/Plan: PT/OT recommending CIR.    Expected Discharge Date:  12/17/17               Expected Discharge Plan:  IP Rehab Facility  In-House Referral:     Discharge planning Services  CM Consult  Post Acute Care Choice:    Choice offered to:     DME Arranged:    DME Agency:     HH Arranged:    HH Agency:     Status of Service:  Completed, signed off  If discussed at [employer] Meetings, dates discussed:    Additional Comments:  12/17/17 J. Jessalynn Mccowan, Charity fundraiserN, BSN Pt medically stable for [employer] and insurance authorization received [facility].    Glennon MacAmerson, Tarshia Kot M, RN 12/17/2017, 4:54 PM

## 2017-12-17 NOTE — Discharge Summary (Signed)
[facility] Physician Discharge Summary  Franklin Hicks OZH:[id number] DOB: July 23, 1951 DOA: 12/13/2017  PCP: Shelva Majestic, MD  Admit date: 12/13/2017 Discharge date: 12/17/2017  Time spent: 35 minutes  Recommendations for Outpatient Follow-up:  1. Neurosurgery f/u in 2 weeks 2. D.C to IP rehab 3. Transition to oral anticoagulation in 2 days   Discharge Diagnoses:  Principal Problem:   Cord compression [facility]) Active Problems:   BPH with urinary obstruction   Diabetes mellitus type 2, controlled (HCC)   Dyslipidemia, goal LDL below 70   Morbid obesity with BMI of 50.0-59.9, adult (HCC)   CAD S/P percutaneous coronary angioplasty   Chronic combined systolic and diastolic heart failure (HCC)   Fall   Abnormal CT scan, cervical spine   Aortic aneurysm [facility])   Discharge Condition: stable  Diet recommendation: carb modified  Filed Weights   12/13/17 1418 12/14/17 0322 12/15/17 0500  Weight: (!) 171.5 kg (378 lb 1.4 oz) (!) 172.6 kg (380 lb 8.2 oz) (!) 173.1 kg (381 lb 9.9 oz)    History of present illness:  Franklin Hicks is a 67 y.o. male with medical history significant obesity, CAD status post percutaneous coronary angioplasty 2018, chronic combined systolic diastolic heart failure, diabetes, history of non-ST elevation myocardial infarction 2018 presents to the emergency department after a fall in the bathtub. Initial evaluation reveals neurological deficits to bilateral upper extremities concerning for cord compression. [facility] are asked to admit.  Information is obtained from the patient and the chart.e reports he was in his usual state of health until this morning when he "slipped on wet bathtub and fell forward". He states he had his head on the side of the tub but he did not lose consciousness. He reports that immediately he felt pain in his neck and an inability to move his extremities. Will relieve his neck remained in the extension position for almost 20  minutes. He denies any headache dizziness syncope or near-syncope. He denies any chest pain palpitation shortness of breath. He denies abdominal pain nausea vomiting. He denies any dysuria hematuria frequency or urgency. The time of admission he is able to move bilateral lower extremities Including toes and knees fLex hips particularly on the right. Upper extremities with gross motor movement at shoulder level mostly some elbow. Also reports "pins and needles from shoulders down to hands.     Hospital Course:   Fall: Seen by physical therapy steady on his feet.  Does not use her knee assist device to ambulate.  Spinal cord compression: Seen by neurosurgery.  MRI ordered.  Candidate for surgical intervention but PE and need for anticoagulation have precluded the possibility of surgery in the near term.  Pulmonary embolism: Patient started on heparin.  PE seen on CTA.   CHF history: Lasix held, I&O was monitored, daily weights obtained  Diabetes: A1c was obtained Sliding scale insulin Current modified diet  Consultations:  Neurosurgery, PMR  Discharge Exam: Vitals:   12/17/17 0913 12/17/17 1000  BP: (!) 155/80 (!) 109/45  Pulse: 72 81  Resp:  16  Temp:    SpO2:  95%    General: NAD, NCAT Cardiovascular: RRR, no MRG Respiratory: CTAB, nl wob  Discharge Instructions    Allergies as of 12/17/2017      Reactions   Ace Inhibitors    Angioedema   Other Hives, Itching   msg      Medication List    STOP taking these medications   atorvastatin 80 MG  tablet Commonly known as:  LIPITOR   Chromium 200 MCG Caps   CINNAMON PO   furosemide 20 MG tablet Commonly known as:  LASIX   Krill Oil 1000 MG Caps   vitamin B-12 100 MCG tablet Commonly known as:  CYANOCOBALAMIN     TAKE these medications   acetaminophen 325 MG tablet Commonly known as:  TYLENOL Take 2 tablets (650 mg total) by mouth every 6 (six) hours as needed for up to 14 days for mild pain (or Fever  >/= 101).   clopidogrel 75 MG tablet Commonly known as:  PLAVIX Take 1 tablet (75 mg total) by mouth daily. Start taking on:  12/18/2017   docusate sodium 100 MG capsule Commonly known as:  COLACE Take 1 capsule (100 mg total) by mouth 2 (two) times daily.   heparin 100-0.45 UNIT/ML-% infusion Inject 1,900 Units/hr into the vein continuous.   metoprolol tartrate 50 MG tablet Commonly known as:  LOPRESSOR Take 1 tablet (50 mg total) by mouth 2 (two) times daily for 14 days.   multivitamin with minerals Tabs tablet Take 1 tablet by mouth daily for 14 days. Start taking on:  12/18/2017   ondansetron 4 MG tablet Commonly known as:  ZOFRAN Take 1 tablet (4 mg total) by mouth every 6 (six) hours as needed for nausea.   potassium citrate 10 MEQ (1080 MG) SR tablet Commonly known as:  UROCIT-K Take 1 tablet (10 mEq total) by mouth 3 (three) times daily for 5 days. What changed:    medication strength  how much to take  when to take this   terazosin 10 MG capsule Commonly known as:  HYTRIN Take 1 capsule (10 mg total) by mouth at bedtime for 14 days. What changed:    how much to take  how to take this  when to take this  additional instructions      Allergies  Allergen Reactions  . Ace Inhibitors     Angioedema  . Other Hives and Itching    msg      The results of significant diagnostics from this hospitalization (including imaging, microbiology, ancillary and laboratory) are listed below for reference.    Significant Diagnostic Studies: Ct Head Wo Contrast  Result Date: 12/13/2017 CLINICAL DATA:  Status post fall in the bathtub today with a blow to the head. Neck pain. Initial encounter. EXAM: CT HEAD WITHOUT CONTRAST CT CERVICAL SPINE WITHOUT CONTRAST TECHNIQUE: Multidetector CT imaging of the head and cervical spine was performed following the standard protocol without intravenous contrast. Multiplanar CT image reconstructions of the cervical spine were also  generated. COMPARISON:  None. FINDINGS: CT HEAD FINDINGS Brain: No evidence of acute infarction, hemorrhage, hydrocephalus, extra-axial collection or mass lesion/mass effect. Vascular: No hyperdense vessel or unexpected calcification. Skull: Intact. Sinuses/Orbits: Clear. Other: Soft tissue contusion anterior forehead noted. CT CERVICAL SPINE FINDINGS Alignment: Maintained.  Straightening of lordosis noted. Skull base and vertebrae: No acute fracture. No primary bone lesion or focal pathologic process. Soft tissues and spinal canal: No prevertebral fluid or swelling. No visible canal hematoma. Disc levels: Loss of disc space height and endplate spurring are seen at C5-6, C6-7 and to a lesser degree, C7-T1. Upper chest: Lung apices clear. Other: None. IMPRESSION: Soft tissue contusion anterior to the frontal bone without underlying fracture. No acute intracranial abnormality. No acute abnormality cervical spine. Degenerative disc disease most notable at C5-6 and C6-7. Electronically Signed   By: Drusilla Kanner M.D.   On: 12/13/2017 12:35  Ct Angio Chest Pe W Or Wo Contrast  Addendum Date: 12/15/2017   ADDENDUM REPORT: 12/15/2017 21:28 ADDENDUM: These results were called by telephone at the time of interpretation on 12/15/2017 at 9:28 pm to Dr. Craige CottaKirby, who verbally acknowledged these results. Electronically Signed   By: Mitzi HansenLance  Furusawa-Stratton M.D.   On: 12/15/2017 21:28   Result Date: 12/15/2017 CLINICAL DATA:  67 y/o M; shortness of breath, PE suspected, intermediate probability, positive D-dimer. EXAM: CT ANGIOGRAPHY CHEST WITH CONTRAST TECHNIQUE: Multidetector CT imaging of the chest was performed using the standard protocol during bolus administration of intravenous contrast. Multiplanar CT image reconstructions and MIPs were obtained to evaluate the vascular anatomy. CONTRAST:  100mL ISOVUE-370 IOPAMIDOL (ISOVUE-370) INJECTION 76% COMPARISON:  09/18/2016 CTA of the chest. FINDINGS: Cardiovascular: 4.0 cm  ascending aorta. Mild calcific atherosclerosis of the aortic arch. Normal heart size. No pericardial effusion. Moderate coronary artery calcific atherosclerosis. Right main pulmonary artery acute embolus extending into multiple segmental arteries. RV/LV ratio equals 0.66. Mediastinum/Nodes: No enlarged mediastinal, hilar, or axillary lymph nodes. Thyroid gland, trachea, and esophagus demonstrate no significant findings. Lungs/Pleura: Lungs are clear. No pleural effusion or pneumothorax. Upper Abdomen: No acute abnormality. Musculoskeletal: Moderate osteoarthrosis of the glenohumeral joint with fibrocystic degeneration of the articular surfaces. Review of the MIP images confirms the above findings. IMPRESSION: 1. Acute pulmonary embolus in right main pulmonary artery extending in the multiple segmental arteries. RV/LV ratio = 0.66. 2. Stable 4.0 cm ascending aortic aneurysm. Recommend annual imaging followup by CTA or MRA. This recommendation follows 2010 ACCF/AHA/AATS/ACR/ASA/SCA/SCAI/SIR/STS/SVM Guidelines for the Diagnosis and Management of Patients with Thoracic Aortic Disease. Circulation. 2010; 121: Z610-R604: E266-e369. 3. Moderate coronary artery calcific atherosclerosis. Electronically Signed: By: Mitzi HansenLance  Furusawa-Stratton M.D. On: 12/15/2017 20:52   Ct Cervical Spine Wo Contrast  Result Date: 12/13/2017 CLINICAL DATA:  Status post fall in the bathtub today with a blow to the head. Neck pain. Initial encounter. EXAM: CT HEAD WITHOUT CONTRAST CT CERVICAL SPINE WITHOUT CONTRAST TECHNIQUE: Multidetector CT imaging of the head and cervical spine was performed following the standard protocol without intravenous contrast. Multiplanar CT image reconstructions of the cervical spine were also generated. COMPARISON:  None. FINDINGS: CT HEAD FINDINGS Brain: No evidence of acute infarction, hemorrhage, hydrocephalus, extra-axial collection or mass lesion/mass effect. Vascular: No hyperdense vessel or unexpected calcification.  Skull: Intact. Sinuses/Orbits: Clear. Other: Soft tissue contusion anterior forehead noted. CT CERVICAL SPINE FINDINGS Alignment: Maintained.  Straightening of lordosis noted. Skull base and vertebrae: No acute fracture. No primary bone lesion or focal pathologic process. Soft tissues and spinal canal: No prevertebral fluid or swelling. No visible canal hematoma. Disc levels: Loss of disc space height and endplate spurring are seen at C5-6, C6-7 and to a lesser degree, C7-T1. Upper chest: Lung apices clear. Other: None. IMPRESSION: Soft tissue contusion anterior to the frontal bone without underlying fracture. No acute intracranial abnormality. No acute abnormality cervical spine. Degenerative disc disease most notable at C5-6 and C6-7. Electronically Signed   By: Drusilla Kannerhomas  Dalessio M.D.   On: 12/13/2017 12:35   Mr Cervical Spine Wo Contrast  Result Date: 12/13/2017 CLINICAL DATA:  67 y/o M; fall in bathtub. Tingling and weakness in the upper and lower extremities. EXAM: MRI CERVICAL SPINE WITHOUT CONTRAST TECHNIQUE: Multiplanar, multisequence MR imaging of the cervical spine was performed. No intravenous contrast was administered. COMPARISON:  12/13/2017 CT cervical spine. FINDINGS: Alignment: Straightening of cervical lordosis. Vertebrae: No fracture, evidence of discitis, or bone lesion. Cord: Faint increased T2 cord signal in gray  matter at the C4 level (series [id number], image 23). No cord hemorrhage, cord expansion, or cord volume loss. Posterior Fossa, vertebral arteries, paraspinal tissues: Several punctate foci of susceptibility are present within the right inferior cerebellar hemisphere, probably hemosiderin deposition from chronic microhemorrhage. Mild prevertebral edema from C1-T2. Disc levels: C2-3: No significant disc displacement, foraminal stenosis, or canal stenosis. Mild facet hypertrophy. C3-4: Small disc bulge with uncovertebral and facet hypertrophy eccentric to the left. Mild left foraminal  stenosis. Mild canal stenosis. C4-5: Disc osteophyte complex with bilateral uncovertebral and facet hypertrophy. Moderate bilateral foraminal stenosis. Mild canal stenosis. Disc contact on the anterior cord. C5-6: Disc osteophyte complex with left-greater-than-right uncovertebral and facet hypertrophy. Moderate right and severe left foraminal stenosis. Moderate canal stenosis with mild cord flattening. C6-7: Disc osteophyte complex with right greater than left uncovertebral and facet hypertrophy. Severe right and moderate left foraminal stenosis. Moderate canal stenosis with mild cord flattening. C7-T1: Small foraminal endplate marginal osteophytes and left-greater-than-right facet hypertrophy. Moderate left foraminal stenosis. No canal stenosis. IMPRESSION: 1. Mild prevertebral edema, probably related to muscle strain and/or soft tissue injury. No evidence for spinal fracture or ligamentous injury. No malalignment. 2. Increase central cord signal at C4 level may represent a mild cord contusion. Differential includes chronic sequelae of compressive myelopathy or prior infectious/inflammatory process. 3. Cervical spondylosis greatest at the C5-6 and C6-7 levels. 4. Moderate C5-6 and C6-7 canal stenosis. No high-grade canal stenosis. 5. Severe left C5-6 and severe right C6-7 foraminal stenosis. Multilevel mild and moderate foraminal stenosis. These results will be called to the ordering clinician or representative by the Radiologist Assistant, and communication documented in the PACS or zVision Dashboard. Electronically Signed   By: Mitzi Hansen M.D.   On: 12/13/2017 17:35    Microbiology: Recent Results (from the past 240 hour(s))  MRSA PCR Screening     Status: None   Collection Time: 12/13/17  2:28 PM  Result Value Ref Range Status   MRSA by PCR NEGATIVE NEGATIVE Final    Comment:        The GeneXpert MRSA Assay (FDA approved for NASAL specimens only), is one component of a comprehensive  MRSA colonization surveillance program. It is not intended to diagnose MRSA infection nor to guide or monitor treatment for MRSA infections. Performed at [facility], [address] [place], [place] [address]      Labs: Basic Metabolic Panel: Recent Labs  Lab 12/13/17 1050 12/13/17 1056 12/14/17 0432 12/16/17 0332 12/16/17 2317 12/17/17 0706  NA 137 140 134* 135 136 135  K 3.9 4.3 3.9 5.7* 3.8 4.0  CL 104 104 105 103 103 103  CO2 21*  --  20* 20* 27 23  GLUCOSE 173* 175* 158* 114* 114* 113*  BUN 23* 31* 16 21* 20 19  CREATININE 1.17 1.10 0.92 1.04 1.03 0.96  CALCIUM 8.6*  --  8.5* 8.4* 8.4* 8.3*   Liver Function Tests: Recent Labs  Lab 12/13/17 1050 12/14/17 0432  AST 31 34  ALT 22 24  ALKPHOS 89 83  BILITOT 0.8 0.7  PROT 7.3 6.9  ALBUMIN 3.3* 3.0*   No results for input(s): LIPASE, AMYLASE in the last 168 hours. No results for input(s): AMMONIA in the last 168 hours. CBC: Recent Labs  Lab 12/13/17 1050 12/13/17 1056 12/14/17 0432 12/16/17 0332  WBC 10.1  --  10.2 13.9*  HGB 13.9 15.0 13.5 13.8  HCT 42.9 44.0 41.2 42.7  MCV 91.7  --  90.4 90.7  PLT  173  --  202 206   Cardiac Enzymes: No results for input(s): CKTOTAL, CKMB, CKMBINDEX, TROPONINI in the last 168 hours. BNP: BNP (last 3 results) No results for input(s): BNP in the last 8760 hours.  ProBNP (last 3 results) No results for input(s): PROBNP in the last 8760 hours.  CBG: Recent Labs  Lab 12/16/17 0745 12/16/17 1225 12/16/17 1657 12/16/17 2131 12/17/17 0748  GLUCAP 125* 149* 129* 116* 127*       Signed:  Haydee Salter MD  FACP  [facility] 12/17/2017, 11:17 AM

## 2017-12-17 NOTE — Progress Notes (Signed)
Subjective: Patient reports "I was able to stand with their help...and sat up for 3 hours yesterday"  Objective: Vital signs in last 24 hours: Temp:  [97.5 F (36.4 C)-98.1 F (36.7 C)] 97.7 F (36.5 C) (04/25 0404) Pulse Rate:  [62-83] 62 (04/25 0404) Resp:  [13-19] 15 (04/25 0404) BP: (127-146)/(64-83) 127/66 (04/25 0404) SpO2:  [96 %-97 %] 97 % (04/25 0404)  Intake/Output from previous day: 04/24 0701 - 04/25 0700 In: 820.3 [P.O.:480; I.V.:340.3] Out: 1400 [Urine:1400] Intake/Output this shift: No intake/output data recorded.  Alert, conversant. Collar in use. BUE remain weak, but pt notes improvement. Hopeful for CIR  Lab Results: Recent Labs    12/16/17 0332  WBC 13.9*  HGB 13.8  HCT 42.7  PLT 206   BMET Recent Labs    12/16/17 0332 12/16/17 2317  NA 135 136  K 5.7* 3.8  CL 103 103  CO2 20* 27  GLUCOSE 114* 114*  BUN 21* 20  CREATININE 1.04 1.03  CALCIUM 8.4* 8.4*    Studies/Results: Ct Angio Chest Pe W Or Wo Contrast  Addendum Date: 12/15/2017   ADDENDUM REPORT: 12/15/2017 21:28 ADDENDUM: These results were called by telephone at the time of interpretation on 12/15/2017 at 9:28 pm to Dr. Craige CottaKirby, who verbally acknowledged these results. Electronically Signed   By: Mitzi HansenLance  Furusawa-Stratton M.D.   On: 12/15/2017 21:28   Result Date: 12/15/2017 CLINICAL DATA:  67 y/o M; shortness of breath, PE suspected, intermediate probability, positive D-dimer. EXAM: CT ANGIOGRAPHY CHEST WITH CONTRAST TECHNIQUE: Multidetector CT imaging of the chest was performed using the standard protocol during bolus administration of intravenous contrast. Multiplanar CT image reconstructions and MIPs were obtained to evaluate the vascular anatomy. CONTRAST:  100mL ISOVUE-370 IOPAMIDOL (ISOVUE-370) INJECTION 76% COMPARISON:  09/18/2016 CTA of the chest. FINDINGS: Cardiovascular: 4.0 cm ascending aorta. Mild calcific atherosclerosis of the aortic arch. Normal heart size. No pericardial  effusion. Moderate coronary artery calcific atherosclerosis. Right main pulmonary artery acute embolus extending into multiple segmental arteries. RV/LV ratio equals 0.66. Mediastinum/Nodes: No enlarged mediastinal, hilar, or axillary lymph nodes. Thyroid gland, trachea, and esophagus demonstrate no significant findings. Lungs/Pleura: Lungs are clear. No pleural effusion or pneumothorax. Upper Abdomen: No acute abnormality. Musculoskeletal: Moderate osteoarthrosis of the glenohumeral joint with fibrocystic degeneration of the articular surfaces. Review of the MIP images confirms the above findings. IMPRESSION: 1. Acute pulmonary embolus in right main pulmonary artery extending in the multiple segmental arteries. RV/LV ratio = 0.66. 2. Stable 4.0 cm ascending aortic aneurysm. Recommend annual imaging followup by CTA or MRA. This recommendation follows 2010 ACCF/AHA/AATS/ACR/ASA/SCA/SCAI/SIR/STS/SVM Guidelines for the Diagnosis and Management of Patients with Thoracic Aortic Disease. Circulation. 2010; 121: Z610-R604: E266-e369. 3. Moderate coronary artery calcific atherosclerosis. Electronically Signed: By: Mitzi HansenLance  Furusawa-Stratton M.D. On: 12/15/2017 20:52    Assessment/Plan:   LOS: 4 days  Awaiting CIR bed. Will discuss ACDF further when rehab complete. Pt agreeable with plan.   Georgiann Cockeroteat, Keniesha Adderly 12/17/2017, 7:42 AM

## 2017-12-17 NOTE — Progress Notes (Signed)
Pt admitted to 4W20. Pt alert and oriented with wife at bedside. Pt in no pain and awaiting dinner.

## 2017-12-17 NOTE — Progress Notes (Signed)
ANTICOAGULATION CONSULT NOTE - Follow Up Consult  Pharmacy Consult for Heparin Indication: pulmonary embolus  Allergies  Allergen Reactions  . Ace Inhibitors     Angioedema  . Other Hives and Itching    msg    Patient Measurements: Height: 6' (182.9 cm) Weight: (!) 381 lb 9.9 oz (173.1 kg) IBW/kg (Calculated) : 77.6 Heparin Dosing Weight: 123 kg  Vital Signs: Temp: 97.7 F (36.5 C) (04/25 1144) Temp Source: Oral (04/25 1144) BP: 114/63 (04/25 1200) Pulse Rate: 72 (04/25 1200)  Labs: Recent Labs    12/16/17 0332  12/16/17 1442 12/16/17 2317 12/17/17 0706 12/17/17 1515  HGB 13.8  --   --   --   --   --   HCT 42.7  --   --   --   --   --   PLT 206  --   --   --   --   --   HEPARINUNFRC  --    < > 0.64  --  0.81* 0.92*  CREATININE 1.04  --   --  1.03 0.96  --    < > = values in this interval not displayed.    Estimated Creatinine Clearance: 120.6 mL/min (by C-G formula based on SCr of 0.96 mg/dL).   Assessment: Anticoag: + new PE per CT, DDimer 12, no AC PTA Heparin level 0.81 this AM, rate reduced and heparin level increased to 0.92.  Goal of Therapy:  Heparin level 0.3-0.7 units/ml Monitor platelets by anticoagulation protocol: Yes   Plan:  Decrease IV heparin to 1600 units/hr Recheck heparin level in 6-8 hrs Daily HL and CBC  Hollister Wessler S. Merilynn Finlandobertson, PharmD, BCPS Clinical Staff Pharmacist Pager [phone]  Misty Stanleyobertson, Asiana Benninger Stillinger 12/17/2017,4:36 PM

## 2017-12-17 NOTE — Progress Notes (Signed)
Franklin Diones, RN  Rehab Admission Coordinator  Physical Medicine and Rehabilitation  PMR Pre-admission  Signed  Date of Service:  12/17/2017 11:51 AM       Related encounter: ED to Hosp-Admission (Current) from 12/13/2017 in [place]            Show:Clear all '[x]' Manual'[x]' Template'[x]' Copied  Added by: '[x]' Franklin Diones, RN   '[]' Hover for details   PMR Admission Coordinator Pre-Admission Assessment  Patient: Franklin Hicks is an 67 y.o., male MRN: [id number] DOB: 1950-09-12 Height: 6' (182.9 cm) Weight: (!) 173.1 kg (381 lb 9.9 oz)                                                                                                                                                  Insurance Information HMO:    PPO: Yes     PCP:       IPA:       80/20:       OTHER:   PRIMARY: U[employer] Medicare      Policy#: [id number]      Subscriber:  Franklin Hicks CM Name: Franklin Hicks      Phone#: [phone]     Fax#: [phone] Pre-Cert#: [id number]      Employer:  Retired Benefits:  Phone #: [phone]     Name:  On line portal Eff. Date: 08/25/17     Deduct:  $0      Out of Pocket Max: $4000 (met $0)      Life Max: N/A CIR: $160 days 1-10      SNF: $0 days 1-20; $50 days 21-100 Outpatient: Medical necessity     Co-Pay: $20/visit Home Health: 100%      Co-Pay: none DME: 80%     Co-Pay: 20% Providers: in network  Medicaid Application Date:        Case Manager:   Disability Application Date:        Case Worker:    Emergency Publishing copy Information    Name Relation Home Work Mobile   Franklin Hicks,Franklin Hicks Spouse [phone]  [phone]   Franklin Hicks Daughter   [phone]     Current Medical History  Patient Admitting Diagnosis: C6 spinal cord injury motor and sensory incomplete, left more affected than right  History of Present Illness: A 67 year old male with history ofCAD with chronic combined CHF,  T2DM,morbid obesity,hypertension, PE in the past;who was admitted on 12/13/2017 after falling forwards in the bathroom with inability to move. He reported numbness and tingling bilateral hands as well as bilateral lower extremities. MRI of cervical spine done showing multilevel DJD with moderate canal stenosis C4-5, severe left C5-6 and severe right stenosis C6-7 with increase in cord signal at C4. He was evaluated by  Dr. Joneen Caraway recommended steroids to help manage central cord injury as well as extensive rehab and collar for stabilization. He developed pleuritic chest pain on 4-23 and was found to have acute PE and right main pulmonary artery extending to multiple segmental arteries and stable 4 cm ascending aortic aneurysm.He was started on IV heparin for treatment. NS to discuss ACDF after rehab complete?Therapy ongoing and working on pregait activity. CIR recommended due to functional deficits.  Past Medical History      Past Medical History:  Diagnosis Date  . Arthritis    "knees" (12/17/2017)  . CAD S/P percutaneous coronary angioplasty 09/20/2016   Nstemi 08/2016. Dr. Doylene Canard. DES- brilinta and asa. Requests change to [facility] cardiology; 95% Ramus -> PCI Resolute Onyx DES 2.75 x 18  . Chronic combined systolic and diastolic heart failure ([place]) 10/09/2016   EF 45% and grade II diastolic after nstemi  . Diet-controlled diabetes mellitus ([place])   . DJD (degenerative joint disease)   . Gout   . High cholesterol   . Hypertension   . NSTEMI (non-ST elevated myocardial infarction) ([place]) 09/20/2016   95% Ramus - > PCI   . Obesity   . Pulmonary embolism ([place]) ~ 2015    Family History  family history includes Liver disease in his mother.  Prior Rehab/Hospitalizations: No previous rehab.  Has the patient had major surgery during 100 days prior to admission? No  Current Medications   Current Facility-Administered Medications:  .  acetaminophen (TYLENOL) tablet 650  mg, 650 mg, Oral, Q6H PRN **OR** acetaminophen (TYLENOL) suppository 650 mg, 650 mg, Rectal, Q6H PRN, Black, Karen M, NP .  atorvastatin (LIPITOR) tablet 80 mg, 80 mg, Oral, q1800, Black, Karen M, NP, 80 mg at 12/16/17 1717 .  clopidogrel (PLAVIX) tablet 75 mg, 75 mg, Oral, Daily, Cherene Altes, MD, 75 mg at 12/17/17 0913 .  docusate sodium (COLACE) capsule 100 mg, 100 mg, Oral, BID, Black, Karen M, NP, 100 mg at 12/17/17 0913 .  fentaNYL (SUBLIMAZE) injection 25-50 mcg, 25-50 mcg, Intravenous, Q2H PRN, Geradine Girt, DO, 25 mcg at 12/13/17 1819 .  furosemide (LASIX) tablet 20 mg, 20 mg, Oral, Q M,W,F, Joette Catching T, MD, 20 mg at 12/16/17 0946 .  heparin ADULT infusion 100 units/mL (25000 units/245m sodium chloride 0.45%), 1,900 Units/hr, Intravenous, Continuous, [facility] Last Rate: 19 mL/hr at 12/17/17 0916, 1,900 Units/hr at 12/17/17 0916 .  hydrALAZINE (APRESOLINE) injection 10 mg, 10 mg, Intravenous, Q6H PRN, Bodenheimer, Charles A, NP .  HYDROcodone-acetaminophen (NORCO/VICODIN) 5-325 MG per tablet 1-2 tablet, 1-2 tablet, Oral, Q6H PRN, Bodenheimer, Charles A, NP, 1 tablet at 12/14/17 1726 .  insulin aspart (novoLOG) injection 0-15 Units, 0-15 Units, Subcutaneous, TID WC, MCherene Altes MD, 2 Units at 12/17/17 [phone].  insulin aspart (novoLOG) injection 0-5 Units, 0-5 Units, Subcutaneous, QHS, McClung, JKimberlee Nearing MD .  MEDLINE mouth rinse, 15 mL, Mouth Rinse, BID, MCherene Altes MD, 15 mL at 12/17/17 0921 .  metoprolol tartrate (LOPRESSOR) tablet 50 mg, 50 mg, Oral, BID, Vann, Jessica U, DO, 50 mg at 12/17/17 0914 .  multivitamin with minerals tablet 1 tablet, 1 tablet, Oral, Daily, MCherene Altes MD, 1 tablet at 12/17/17 0913 .  ondansetron (ZOFRAN) tablet 4 mg, 4 mg, Oral, Q6H PRN **OR** ondansetron (ZOFRAN) injection 4 mg, 4 mg, Intravenous, Q6H PRN, Black, Karen M, NP .  potassium citrate (UROCIT-K) SR tablet 10 mEq, 10 mEq, Oral, TID, MThereasa Solo JKimberlee Nearing MD, 10 mEq  at 12/17/17 0913 .  terazosin (HYTRIN) capsule 10 mg, 10 mg, Oral, QHS, Black, Karen M, NP, 10 mg at 12/16/17 2301 .  vitamin B-12 (CYANOCOBALAMIN) tablet 100 mcg, 100 mcg, Oral, Daily, Cherene Altes, MD, 100 mcg at 12/17/17 0913  Patients Current Diet: Diet Carb Modified Fluid consistency: Thin; Room service appropriate? Yes Diet - low sodium heart healthy  Precautions / Restrictions Precautions Precautions: Cervical Precaution Comments: reviewed cervical precautions  Cervical Brace: Hard collar Restrictions Weight Bearing Restrictions: No   Has the patient had 2 or more falls or a fall with injury in the past year?No.  However patient had 1 fall in the bathtub that resulted in acute hospitalization and then current admission to inpatient rehab.  Prior Activity Level Community (5-7x/wk): Went out several times each day.  Was driving.  Home Assistive Devices / Equipment Home Assistive Devices/Equipment: Cane (specify quad or straight), Eyeglasses, Grab bars in shower Home Equipment: Cane - single point, Grab bars - tub/shower  Prior Device Use: Indicate devices/aids used by the patient prior to current illness, exacerbation or injury? 2 canes used together.  Prior Functional Level Prior Function Level of Independence: Needs assistance Gait / Transfers Assistance Needed: walking with 2 canes all the time ADL's / Homemaking Assistance Needed: bathed and dressed independently Comments: drives, retired Tourist information centre manager (taught PE)  Self Care: Did the patient need help bathing, dressing, using the toilet or eating?  Independent  Indoor Mobility: Did the patient need assistance with walking from room to room (with or without device)? Independent  Stairs: Did the patient need assistance with internal or external stairs (with or without device)? Independent  Functional Cognition: Did the patient need help planning regular tasks such as shopping or  remembering to take medications? Independent  Current Functional Level Cognition  Overall Cognitive Status: Within Functional Limits for tasks assessed Orientation Level: Oriented X4 General Comments: No apparent cognitive deficits noted     Extremity Assessment (includes Sensation/Coordination)  Upper Extremity Assessment: Defer to OT evaluation RUE Deficits / Details: shoulder 3-/5 flexion and abduction; elbow flex/ext 3/5; forearm 3-/5; wrist 3+/5; hand flex/ext 3+/5.  Able to oppose digit 2  RUE Sensation: (intact to light touch ) RUE Coordination: decreased fine motor, decreased gross motor LUE Deficits / Details: shoulder 3-/5 flex and abd; elbow flex 3-/5, ext 3/5, forearm 3-/5; wrist 3/5; hand 1/5 except thumb 2/5  LUE Sensation: (intact LT ) LUE Coordination: decreased fine motor, decreased gross motor  Lower Extremity Assessment: RLE deficits/detail, LLE deficits/detail RLE Deficits / Details: right leg is stronger than left leg with ankle 4/5, knee at least 3/5 seated extension and hip flexion at least 3-/5.  RLE Sensation: WNL LLE Deficits / Details: Pt with 2+/5 DF, PF, knee weak quad 2+/5 grossly, hip flexion (unable to lift leg agaist gravity).  At baseline pt has a "bad knee" with limited knee flexion ROM on this side, however, since he fell he is reporting some posterior/lateral knee and tigh pain (he reports he was positioned akwardly with one leg in and one leg out of the tub).  This will need to be monitored to ensure no further follow up is needed.  LLE Sensation: WNL    ADLs  Overall ADL's : Needs assistance/impaired Eating/Feeding: Moderate assistance, With adaptive utensils, Sitting Eating/Feeding Details (indicate cue type and reason): Pt provided with foam to build up handles of utensils as well as a lidded mug.  He was able to pick up and drink from cup with  supervision, but requires min A to release mug.  He requires mod A to self feed with utensils at this  time  Grooming: Wash/dry face, Moderate assistance, Sitting Grooming Details (indicate cue type and reason): mod A to wash face  Upper Body Bathing: Maximal assistance, Sitting Lower Body Bathing: Total assistance, Bed level Upper Body Dressing : Total assistance, Sitting Lower Body Dressing: Total assistance, Sit to/from stand Toilet Transfer: Maximal assistance, +2 for physical assistance, Stand-pivot, BSC Toilet Transfer Details (indicate cue type and reason): utilized stedy for transfer  Toileting- Water quality scientist and Hygiene: Total assistance, +2 for physical assistance, Bed level Functional mobility during ADLs: Maximal assistance, +2 for physical assistance    Mobility  Overal bed mobility: Needs Assistance Bed Mobility: Rolling Rolling: Mod assist, +2 for physical assistance Sidelying to sit: Mod assist, +2 for physical assistance Sit to sidelying: Max assist, +2 for physical assistance, +2 for safety/equipment General bed mobility comments: Assisted trunk and L leg to roll and trunk to get up.    Transfers  Overall transfer level: Needs assistance Equipment used: Ambulation equipment used Transfer via Lift Equipment: Letta Median Transfers: Sit to/from Stand Sit to Stand: Max assist(+3) Stand pivot transfers: Max assist, +2 physical assistance, +2 safety/equipment General transfer comment: attempted transfer by stedy x1 with 3 person assist, but the pt unable to get his L LE under him from such a low position.  So used maximove to transfer to the bed.    Ambulation / Gait / Stairs / Wheelchair Mobility  Ambulation/Gait General Gait Details: unable at this time.     Posture / Balance Dynamic Sitting Balance Sitting balance - Comments: able to maintain EOB sitting with supervision.  Able to reciprocally scoot hips to EOB  Balance Overall balance assessment: Needs assistance Sitting-balance support: Feet supported, Single extremity supported, Bilateral upper  extremity supported Sitting balance-[name]: Fair Sitting balance - Comments: able to maintain EOB sitting with supervision.  Able to reciprocally scoot hips to EOB  Standing balance support: Bilateral upper extremity supported, During functional activity Standing balance-[name]: Poor Standing balance comment: pt stood in STEDY x3. Second trial, pt stepped his left LE up under him and worked on w/shifts for 25 secs.  I sitting in the STEDY,, pt worked on modified arm pushups.    Special needs/care consideration BiPAP/CPAP NO CPM No Continuous Drip IV Heparin drip Dialysis No        Life Vest No Oxygen No Special Bed No Trach Size No Wound Vac (area) No     Skin:  Hard cervical collar                            Bowel mgmt: Last BM 12/16/17 Bladder mgmt: External catheter Diabetic mgmt Utilizing diet control, but no medications at home    Previous Home Environment Living Arrangements: Spouse/significant other, Other relatives Available Help at Discharge: Family Type of Home: House Home Layout: Multi-level, Able to live on main level with bedroom/bathroom, Laundry or work area in basement Alternate Therapist, sports of Steps: 6 Home Access: Stairs to enter [employer] of Steps: 6(2 in the back to get onto main level wiht bedroom) Bathroom Shower/Tub: Chiropodist: [place]: No Additional Comments: back entrance with 2 steps that would get you to the main level where the kitchen and bed/bath are if it is not muddy they can pull the car around to the steps.   Discharge  Living Setting Plans for Discharge Living Setting: Patient's home, House, Lives with (comment)(Lives with wife, sister in law and 2 Great Grand kids.) Type of Home at Discharge: House Discharge Home Layout: Two level, Able to live on main level with bedroom/bathroom(Split level home.) Alternate Level Stairs-Number of Steps: 6 steps up to main level and 6  steps down to basement from foyer area. Discharge Home Access: Stairs to enter Entrance Stairs-Number of Steps: 6 steps.  Back entry with 2 steps Does the patient have any problems obtaining your medications?: No  Social/Family/Support Systems Patient Roles: Spouse, Parent, Other (Comment)(Has a wife, daughter, grand daughters, 2 great grand children) Contact Information: Franklin Hicks - spouse - [phone] Anticipated Caregiver: Wife, daughter, additional family Anticipated Caregiver's Contact Information: Franklin Hicks - daughter - [phone] Ability/Limitations of Caregiver: Wife and sister-in-law are retired and can assist.  Other family can assist as needed. Caregiver Availability: 24/7 Discharge Plan Discussed with Primary Caregiver: Yes Is Caregiver In Agreement with Plan?: Yes Does Caregiver/Family have Issues with Lodging/Transportation while Pt is in Rehab?: No  Goals/Additional Needs Patient/Family Goal for Rehab: PT/OT supervision to min to mod assist goals Expected length of stay: 20-27 days Cultural Considerations: [employer] Dietary Needs: Carb mod, med cal, thin liquids Equipment Needs: TBD Pt/Family Agrees to Admission and willing to participate: Yes Program Orientation Provided & Reviewed with Pt/Caregiver Including Roles  & Responsibilities: Yes  Decrease burden of Care through IP rehab admission: N/A  Possible need for SNF placement upon discharge: Yes, but family hopeful for good recovery so that they can take patient home  Patient Condition: This patient's medical and functional status has changed since the consult dated: 12/14/17 in which the Rehabilitation Physician determined and documented that the patient's condition is appropriate for intensive rehabilitative care in an inpatient rehabilitation facility. See "History of Present Illness" (above) for medical update. Functional changes are:  Currently requiring Max +2 to total assist +2-3 for mobility and  ADLs. Patient's medical and functional status update has been discussed with the Rehabilitation physician and patient remains appropriate for inpatient rehabilitation. Will admit to inpatient rehab today.  Preadmission Screen Completed By:  Franklin Hicks, 12/17/2017 12:06 PM ______________________________________________________________________   Discussed status with Franklin Hicks on 12/17/17 at 1206 and received telephone approval for admission today.  Admission Coordinator:  Franklin Hicks, time 1206/Date 12/17/17             Cosigned by: Franklin Staggers, MD at 12/17/2017 12:14 PM  Revision History

## 2017-12-17 NOTE — Progress Notes (Signed)
ANTICOAGULATION CONSULT NOTE - Follow Up Consult  Pharmacy Consult for Heparin Indication: pulmonary embolus  Allergies  Allergen Reactions  . Ace Inhibitors     Angioedema  . Other Hives and Itching    msg    Patient Measurements: Height: 6' (182.9 cm) Weight: (!) 381 lb 9.9 oz (173.1 kg) IBW/kg (Calculated) : 77.6 Heparin Dosing Weight: 123  Vital Signs: Temp: 97.8 F (36.6 C) (04/25 0744) Temp Source: Oral (04/25 0744) BP: 158/76 (04/25 0744) Pulse Rate: 63 (04/25 0744)  Labs: Recent Labs    12/16/17 0332 12/16/17 [id number] 12/16/17 1442 12/16/17 2317 12/17/17 0706  HGB 13.8  --   --   --   --   HCT 42.7  --   --   --   --   PLT 206  --   --   --   --   HEPARINUNFRC  --  0.70 0.64  --  0.81*  CREATININE 1.04  --   --  1.03  --     Estimated Creatinine Clearance: 112.4 mL/min (by C-G formula based on SCr of 1.03 mg/dL).  Assessment: 66yom admitted after falling and hitting head.  Head CT negative but does have spinal cord injury.   Hx CAD with stent 1/18 remains on clopidogrel  CBC stable Pleuritic chest pain yesterday, + DDImer 12> + chest CT for PE On heparin drip.  Repeat HL 0.81  Goal of Therapy:  Heparin level 0.3-0.7 units/ml Monitor platelets by anticoagulation protocol: Yes   Plan:  Decrease heparin drip at 1900 units/hr HLabove goal range, will draw heparin level at 1600 Monitor CBC  Tera Materatherine A Kyara Boxer, PharmD  12/17/2017,8:33 AM

## 2017-12-17 NOTE — Care Management Important Message (Signed)
Important Message  Patient Details  Name: Franklin Hicks MRN: [id number] Date of Birth: 09/03/1950   Medicare Important Message Given:  Yes    Dorena BodoIris Braxden [place] 12/17/2017, 2:48 PM

## 2017-12-18 ENCOUNTER — Inpatient Hospital Stay (HOSPITAL_COMMUNITY): Payer: Medicare Other

## 2017-12-18 ENCOUNTER — Inpatient Hospital Stay (HOSPITAL_COMMUNITY): Payer: Medicare Other | Admitting: Physical Therapy

## 2017-12-18 ENCOUNTER — Inpatient Hospital Stay (HOSPITAL_COMMUNITY): Payer: Medicare Other | Admitting: Occupational Therapy

## 2017-12-18 ENCOUNTER — Encounter (HOSPITAL_COMMUNITY): Payer: Self-pay | Admitting: Physical Medicine & Rehabilitation

## 2017-12-18 DIAGNOSIS — S14104S Unspecified injury at C4 level of cervical spinal cord, sequela: Secondary | ICD-10-CM

## 2017-12-18 DIAGNOSIS — I2699 Other pulmonary embolism without acute cor pulmonale: Secondary | ICD-10-CM

## 2017-12-18 DIAGNOSIS — G8250 Quadriplegia, unspecified: Secondary | ICD-10-CM

## 2017-12-18 LAB — CBC WITH DIFFERENTIAL/PLATELET
Basophils Absolute: 0 10*3/uL (ref 0.0–0.1)
Basophils Relative: 0 %
EOS ABS: 0.2 10*3/uL (ref 0.0–0.7)
Eosinophils Relative: 2 %
HCT: 41.4 % (ref 39.0–52.0)
HEMOGLOBIN: 13.3 g/dL (ref 13.0–17.0)
LYMPHS PCT: 29 %
Lymphs Abs: 2.9 10*3/uL (ref 0.7–4.0)
MCH: 29.2 pg (ref 26.0–34.0)
MCHC: 32.1 g/dL (ref 30.0–36.0)
MCV: 90.8 fL (ref 78.0–100.0)
Monocytes Absolute: 0.8 10*3/uL (ref 0.1–1.0)
Monocytes Relative: 8 %
Neutro Abs: 6.2 10*3/uL (ref 1.7–7.7)
Neutrophils Relative %: 61 %
Platelets: 192 10*3/uL (ref 150–400)
RBC: 4.56 MIL/uL (ref 4.22–5.81)
RDW: 14.8 % (ref 11.5–15.5)
WBC: 10.2 10*3/uL (ref 4.0–10.5)

## 2017-12-18 LAB — COMPREHENSIVE METABOLIC PANEL
ALK PHOS: 83 U/L (ref 38–126)
ALT: 22 U/L (ref 17–63)
AST: 18 U/L (ref 15–41)
Albumin: 2.7 g/dL — ABNORMAL LOW (ref 3.5–5.0)
Anion gap: 8 (ref 5–15)
BUN: 17 mg/dL (ref 6–20)
CALCIUM: 8.3 mg/dL — AB (ref 8.9–10.3)
CO2: 25 mmol/L (ref 22–32)
CREATININE: 1.08 mg/dL (ref 0.61–1.24)
Chloride: 103 mmol/L (ref 101–111)
GFR calc non Af Amer: >60 mL/min (ref >60)
GLUCOSE: 133 mg/dL — AB (ref 65–99)
Potassium: 4 mmol/L (ref 3.5–5.1)
SODIUM: 136 mmol/L (ref 135–145)
Total Bilirubin: 0.8 mg/dL (ref 0.3–1.2)
Total Protein: 6.2 g/dL — ABNORMAL LOW (ref 6.5–8.1)

## 2017-12-18 LAB — GLUCOSE, CAPILLARY
GLUCOSE-CAPILLARY: 126 mg/dL — AB (ref 65–99)
GLUCOSE-CAPILLARY: 110 mg/dL — AB (ref 65–99)
GLUCOSE-CAPILLARY: 105 mg/dL — AB (ref 65–99)
Glucose-Capillary: 135 mg/dL — ABNORMAL HIGH (ref 65–99)

## 2017-12-18 LAB — HEPARIN LEVEL (UNFRACTIONATED): Heparin Unfractionated: 0.49 IU/mL (ref 0.30–0.70)

## 2017-12-18 MED ORDER — RIVAROXABAN 20 MG PO TABS
20.0000 mg | ORAL_TABLET | Freq: Every day | ORAL | Status: DC
  Administered 2018-01-08 – 2018-01-12 (×5): 20 mg via ORAL
  Filled 2017-12-18 (×5): qty 1

## 2017-12-18 MED ORDER — RIVAROXABAN 15 MG PO TABS
15.0000 mg | ORAL_TABLET | Freq: Two times a day (BID) | ORAL | Status: AC
  Administered 2017-12-18 – 2018-01-07 (×42): 15 mg via ORAL
  Filled 2017-12-18 (×44): qty 1

## 2017-12-18 NOTE — Plan of Care (Signed)
  Problem: Consults Goal: RH SPINAL CORD INJURY PATIENT EDUCATION Description  See Patient Education module for education specifics.  Outcome: Progressing   Problem: SCI BOWEL ELIMINATION Goal: RH STG MANAGE BOWEL WITH ASSISTANCE Description STG Manage Bowel with min Assistance.  Outcome: Progressing   Problem: SCI BLADDER ELIMINATION Goal: RH STG MANAGE BLADDER WITH ASSISTANCE Description STG Manage Bladder With min Assistance  Outcome: Progressing   Problem: RH SKIN INTEGRITY Goal: RH STG SKIN FREE OF INFECTION/BREAKDOWN Description No new breakdown with min assist   Outcome: Progressing Goal: RH STG ABLE TO PERFORM INCISION/WOUND CARE W/ASSISTANCE Description STG Able To Perform Incision/Wound Care With BJ'sMin Assistance.  Outcome: Progressing   Problem: RH SAFETY Goal: RH STG ADHERE TO SAFETY PRECAUTIONS W/ASSISTANCE/DEVICE Description STG Adhere to Safety Precautions With min Assistance/Device.  Outcome: Progressing   Problem: RH PAIN MANAGEMENT Goal: RH STG PAIN MANAGED AT OR BELOW PT'S PAIN GOAL Description <3 out of 10.   Outcome: Progressing

## 2017-12-18 NOTE — Discharge Instructions (Addendum)
Inpatient Rehab Discharge Instructions  Franklin Hicks Discharge date and time:  01/13/18  Activities/Precautions/ Functional Status: Activity: No lifting, driving, or strenuous exercise till cleared by MD. Wear collar at all times. Diet: diabetic diet Wound Care: Not needed.   Functional status:  ___ No restrictions     ___ Walk up steps independently _X__ 24/7 supervision/assistance   ___ Walk up steps with assistance ___ Intermittent supervision/assistance  ___ Bathe/dress independently ___ Walk with walker     _X__ Bathe/dress with assistance ___ Walk Independently    ___ Shower independently ___ Walk with assistance    ___ Shower with assistance _X__ No alcohol     ___ Return to work/school ________   COMMUNITY REFERRALS UPON DISCHARGE:    Home Health:   PT     OT     RN                        Agency:  [facility] Phone: [phone]   Medical Equipment/Items Ordered:  Wheelchair, cushion, hospital bed, commode, walker, transfer board                                                      Agency/Supplier:  [facility] @ [phone]  Special Instructions: 1. Use sliding board for transfers. Walk/stand with therapy only at this time.  2. HHRN to draw CBC on 5/28 with results to primary MD.   My questions have been answered and I understand these instructions. I will adhere to these goals and the provided educational materials after my discharge from the hospital.  Patient/Caregiver Signature _______________________________ Date __________  Clinician Signature _______________________________________ Date __________  Please bring this form and your medication list with you to all your follow-up doctor's appointments.     Information on my medicine - XARELTO (rivaroxaban)  This medication education was reviewed with me or my healthcare representative as part of my discharge preparation.    WHY WAS XARELTO PRESCRIBED FOR YOU? Xarelto was prescribed to  treat blood clots that may have been found in the veins of your legs (deep vein thrombosis) or in your lungs (pulmonary embolism) and to reduce the risk of them occurring again.  What do you need to know about Xarelto? The starting dose is one 15 mg tablet taken TWICE daily with food for the FIRST 21 DAYS then on 01/08/2018 the dose is changed to one 20 mg tablet taken ONCE A DAY with your evening meal.  DO NOT stop taking Xarelto without talking to the health care provider who prescribed the medication.  Refill your prescription for 20 mg tablets before you run out.  After discharge, you should have regular check-up appointments with your healthcare provider that is prescribing your Xarelto.  In the future your dose may need to be changed if your kidney function changes by a significant amount.  What do you do if you miss a dose? If you are taking Xarelto TWICE DAILY and you miss a dose, take it as soon as you remember. You may take two 15 mg tablets (total 30 mg) at the same time then resume your regularly scheduled 15 mg twice daily the next day.  If you are taking Xarelto ONCE DAILY and you miss a dose, take it as soon as you remember on  the same day then continue your regularly scheduled once daily regimen the next day. Do not take two doses of Xarelto at the same time.   Important Safety Information Xarelto is a blood thinner medicine that can cause bleeding. You should call your healthcare provider right away if you experience any of the following: ? Bleeding from an injury or your nose that does not stop. ? Unusual colored urine (red or dark brown) or unusual colored stools (red or black). ? Unusual bruising for unknown reasons. ? A serious fall or if you hit your head (even if there is no bleeding).  Some medicines may interact with Xarelto and might increase your risk of bleeding while on Xarelto. To help avoid this, consult your healthcare provider or pharmacist prior to using  any new prescription or non-prescription medications, including herbals, vitamins, non-steroidal anti-inflammatory drugs (NSAIDs) and supplements.  This website has more information on Xarelto: VisitDestination.com.br.

## 2017-12-18 NOTE — Progress Notes (Signed)
Occupational Therapy Session Note  Patient Details  Name: Franklin Hicks MRN: [id number] Date of Birth: 1951-01-03  Today's Date: 12/18/2017 OT Individual Time: 2500-3704 OT Individual Time Calculation (min): 70 min    Skilled Therapeutic Interventions/Progress Updates:    1:1. Pt without report of pain. Pt agreeable to attempt use of walking sling on sara to assume standing. Pt sitting>supine with A to move BLE and VC for sequencing. Condom cath comes off while completing bed mobility. OT dons walking sling and pt has incontinent bladder movement on shorts/sheet/bed. Pt rolls back onto bed and Pt complete rolling 4x with MOD A of 2 to doff shorts, change sheets and don new gown. Pt completes gross pinch of foam cubes and picking up checkers for finger>palm translation with VC for decreasing compensatory movements. Exited session with pt seated in bed, call light in reach and all needs met.   Therapy Documentation Precautions:  Restrictions Weight Bearing Restrictions: No  See Function Navigator for Current Functional Status.   Therapy/Group: Individual Therapy  Tonny Branch 12/18/2017, 12:11 PM

## 2017-12-18 NOTE — Evaluation (Signed)
Physical Therapy Assessment and Plan  Patient Details  Name: Franklin Hicks MRN: [id number] Date of Birth: Nov 25, 1950  PT Diagnosis: Abnormal posture, Coordination disorder, Difficulty walking, Impaired sensation, Muscle weakness and Quadriplegia Rehab Potential: Good ELOS: 4 weeks   Today's Date: 12/18/2017 PT Individual Time: 1450-1555 PT Individual Time Calculation (min): 65 min    Problem List:  Patient Active Problem List   Diagnosis Date Noted  . C4 spinal cord injury, sequela ([place]) 12/18/2017  . Tetraplegia ([place]) 12/18/2017  . Acute pulmonary embolism ([place]) 12/18/2017  . Aortic aneurysm ([place]) 12/16/2017  . Fall 12/13/2017  . Cord compression ([place]) 12/13/2017  . Abnormal CT scan, cervical spine 12/13/2017  . Chronic combined systolic and diastolic heart failure ([place]) 10/09/2016  . CAD S/P percutaneous coronary angioplasty 09/20/2016  . H/O non-ST elevation myocardial infarction (NSTEMI) 09/20/2016  . Erectile dysfunction 07/30/2015  . Morbid obesity with BMI of 50.0-59.9, adult ([place]) 07/18/2013  . History of pulmonary embolism 07/13/2013  . Angioedema of lips 07/13/2013  . Multinodular goiter 07/13/2013  . Dyslipidemia, goal LDL below 70 10/02/2011  . SOB (shortness of breath) 10/02/2011  . BPH with urinary obstruction 07/17/2010  . Diabetes mellitus type 2, controlled ([place]) 07/17/2010  . Bilateral lower extremity edema 12/08/2008  . NEUROPATHY, IDIOPATHIC PERIPHERAL NEC 05/31/2007  . Osteoarthritis 03/22/2007  . Gout 03/15/2007  . Essential hypertension 03/15/2007    Past Medical History:  Past Medical History:  Diagnosis Date  . Arthritis    "knees" (12/17/2017)  . CAD S/P percutaneous coronary angioplasty 09/20/2016   Nstemi 08/2016. Dr. Doylene Canard. DES- brilinta and asa. Requests change to [facility] cardiology; 95% Ramus -> PCI Resolute Onyx DES 2.75 x 18  . Central cord syndrome ([place]) 12/17/2017  . Chronic combined systolic and diastolic heart failure ([place]) 10/09/2016    EF 45% and grade II diastolic after nstemi  . Diet-controlled diabetes mellitus ([place])   . DJD (degenerative joint disease)   . Gout   . High cholesterol   . Hypertension   . NSTEMI (non-ST elevated myocardial infarction) ([place]) 09/20/2016   95% Ramus - > PCI   . Obesity   . Pulmonary embolism ([place]) ~ 2015   Past Surgical History:  Past Surgical History:  Procedure Laterality Date  . CARDIAC CATHETERIZATION N/A 09/19/2016   Procedure: Left Heart Cath and Coronary Angiography;  Surgeon: Dixie Dials, MD;  Location: [place] [facility];  Service: Cardiovascular: 95% proximal Ramus Intermedius --> PCI  . CARDIAC CATHETERIZATION N/A 09/19/2016   Procedure: Coronary Stent Intervention;  Surgeon: Nelva Bush, MD;  Location: [place] [facility];  Service: Cardiovascular: 95% ramus intermedius;  Resolute Onyx 2.75 x 18 mm drug-eluting stent  . CYSTOSCOPY/RETROGRADE/URETEROSCOPY/STONE EXTRACTION WITH BASKET  5379,4327   ureteral stone   . MENISCUS REPAIR Left    knee open meniscetomy  . TRANSTHORACIC ECHOCARDIOGRAM  2004   no lvh nl ejection fraction  . TRANSTHORACIC ECHOCARDIOGRAM  09/19/2016   In setting of NSTEMI:  EF 45-50% with diffuse hypokinesis. GR 2 DD. Mild biatrial enlargement.    Assessment & Plan Clinical Impression: Franklin Hicks is a 67 y.o. male with history of CAD, T2DM, HTN who was admitted on 12/13/17 after falling forwards in the bathroom with inability to move. He reported numbness and tingling bilateral hands and BLE. MRI Cervical spins showed  multilevel DDD with moderate canal stenosis C4/5, severe left C5/6 and severe right stenosis C6/7 with increase in cord signal C4.  He was evaluated by Dr. Vertell Limber  who recommended steroids to help manage central cord injury with extensive rehab for central cord injury and collar for stabilization. Patient reports that he was independent for ADLs and ambulated with 2 canes. He reports improvement in strength and coordination RUE> LUE .  Therapy evaluations to be completed today. CIR recommended due to rehab needs and functional deficits.  Patient transferred to CIR on 12/17/2017 .   Patient currently requires total to +2 assist with mobility secondary to muscle weakness and muscle paralysis, decreased cardiorespiratoy endurance, impaired timing and sequencing, abnormal tone, unbalanced muscle activation, decreased coordination and decreased motor planning,  , decreased safety awareness and decreased sitting balance, decreased standing balance and decreased postural control.  Prior to hospitalization, patient was modified independent  with mobility and lived with Spouse, Family in a House home.  Home access is 6Stairs to enter.  Patient will benefit from skilled PT intervention to maximize safe functional mobility, minimize fall risk and decrease caregiver burden for planned discharge home with 24 hour S/A.  Anticipate patient will benefit from follow up [place] at discharge.  PT - End of Session Activity Tolerance: Tolerates 10 - 20 min activity with multiple rests Endurance Deficit: Yes PT Assessment Rehab Potential (ACUTE/IP ONLY): Good PT Barriers to Discharge: Inaccessible home environment;Weight PT Patient demonstrates impairments in the following area(s): Balance;Sensory;Endurance;Motor;Pain;Safety PT Transfers Functional Problem(s): Bed Mobility;Bed to Chair;Car;Furniture PT Locomotion Functional Problem(s): Stairs;Wheelchair Mobility;Ambulation PT Plan PT Intensity: Minimum of 1-2 x/day ,45 to 90 minutes PT Frequency: 5 out of 7 days PT Duration Estimated Length of Stay: 4 weeks PT Treatment/Interventions: Ambulation/gait training;Discharge planning;Functional mobility training;Psychosocial support;Therapeutic Activities;Balance/vestibular training;Neuromuscular re-education;Therapeutic Exercise;Wheelchair propulsion/positioning;DME/adaptive equipment instruction;Pain management;Splinting/orthotics;UE/LE Strength  taining/ROM;Community reintegration;Functional electrical stimulation;Patient/family education;UE/LE Arts development officer PT Transfers Anticipated Outcome(s): supervision PT Locomotion Anticipated Outcome(s): min assist ambulatory PT Recommendation Recommendations for Other Services: Neuropsych consult;Therapeutic Recreation consult Therapeutic Recreation Interventions: Stress management Follow Up Recommendations: 24 hour supervision/assistance;Home health PT Patient destination: Home Equipment Recommended: To be determined  Skilled Therapeutic Intervention No c/o pain at rest, but does report some painful N/T in hands.  Session focus on initial PT evaluation, pt education in rehab process, goals of therapy, ELOS, and expected f/u at d/c.  No +2 available for session.  Pt requires assist as noted below for bed mobility.  Attempted sit<>stand in sara+ with walking sling x3, however due to pt size, machine kept sliding across the floor towards him, risking injury to his feet.  Would try again with +2 assist to stabilize machine.  Pt returned to bed at end of session and positioned to comfort with call bell in reach and needs met.   PT Evaluation Precautions/Restrictions Precautions Precautions: Cervical Required Braces or Orthoses: Cervical Brace Cervical Brace: Hard collar Restrictions Weight Bearing Restrictions: No Pain Pain Assessment Pain Scale: 0-10 Pain Score: 6  Pain Type: Neuropathic pain Pain Location: Arm Pain Orientation: Left;Right Pain Descriptors / Indicators: Numbness Pain Onset: On-going Home Living/Prior Functioning Home Living Living Arrangements: Spouse/significant other;Other relatives Available Help at Discharge: Family;Available 24 hours/day Type of Home: House Home Access: Stairs to enter [employer] of Steps: 6 Entrance Stairs-Rails: Right;Left Home Layout: Multi-level Alternate Level Stairs-Number of Steps: 6 Alternate Level  Stairs-Rails: Right Bathroom Shower/Tub: Tub/shower unit(upstairs) Additional Comments: back entrance with 2 steps that would get you to the renovated basement  Lives With: Spouse;Family Prior Function Level of Independence: Independent with gait;Independent with transfers;Requires assistive device for independence(uses 2 SPC in community, 1 SPC in house)  Able to Take Stairs?: Yes  Driving: Yes 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, retired Tourist information centre manager (taught PE) Vision/Perception  Geologist, engineering: Within [place]: Intact  Cognition Overall Cognitive Status: Within Functional Limits for tasks assessed Arousal/Alertness: Awake/alert Orientation Level: Oriented X4 Memory: Appears intact Awareness: Appears intact Problem Solving: Appears intact Safety/Judgment: Appears intact Sensation Sensation Light Touch: Appears Intact(LEs) Stereognosis: Impaired by gross assessment Hot/Cold: Not tested Proprioception: Not tested Coordination Gross Motor Movements are Fluid and Coordinated: No Fine Motor Movements are Fluid and Coordinated: No Coordination and Movement Description: very limited AROM in BUE/BLE; limited PROM of left knee Finger Nose Finger Test: not tested due to impaired shoulder strength Heel Shin Test: unable 2/2 baseline L knee impairment  Motor  Motor Motor: Tetraplegia  Mobility Bed Mobility Bed Mobility: Sit to Supine;Supine to Sit;Scooting to HOB Supine to Sit: 3: Mod assist;HOB elevated;With rails Sit to Supine: 1: +2 Total assist;HOB flat Sit to Supine: Patient Percentage: 20% Sit to Supine - Details: Manual facilitation for placement;Manual facilitation for weight shifting;Verbal cues for safe use of DME/AE;Verbal cues for technique Scooting to HOB: 1: +2 Total assist Scooting to [facility]: Patient Percentage: 10% Transfers Transfers: No Locomotion  Ambulation Ambulation: No Gait Gait:  No Stairs / Additional Locomotion Stairs: No  Trunk/Postural Assessment  Cervical Assessment Cervical Assessment: (cervical collar) Thoracic Assessment Thoracic Assessment: Within Functional Limits Lumbar Assessment Lumbar Assessment: Within Functional Limits Postural Control Postural Control: (functional for static sit only, mod A dynamic)  Balance Static Sitting Balance Static Sitting - Level of Assistance: 5: Stand by assistance Dynamic Sitting Balance Dynamic Sitting - Level of Assistance: 3: Mod assist Sitting balance - Comments: mod A to reach out of base of support Static Standing Balance Static Standing - Level of Assistance: (attempted with Clarise Cruz lift, unable to achieve this am) Extremity Assessment  RUE Assessment RUE Assessment: Exceptions to [facility] RUE AROM (degrees) Right Shoulder Flexion: 100 Degrees RUE Strength Right Shoulder Flexion: 2-/5 Right Elbow Flexion: 4-/5 Right Elbow Extension: 4-/5 Right Hand Gross Grasp: Impaired(4-/5 ) LUE Assessment LUE Assessment: Exceptions to WFL LUE AROM (degrees) Left Shoulder Flexion: 80 Degrees LUE Strength Left Elbow Flexion: 4/5 Left Elbow Extension: 4/5 Left Hand Gross Grasp: Impaired(2-/5) RLE Assessment RLE Assessment: Within Functional Limits(4/5 knee and ankle, unable to assess hip 2/2 body habitus) LLE Assessment LLE Assessment: Exceptions to [facility] LLE Strength LLE Overall Strength Comments: 3+/5 at knee and ankle, unable to assess hip in sitting 2/2 body habitus   See Function Navigator for Current Functional Status.   Refer to Care Plan for Long Term Goals  Recommendations for other services: Neuropsych and Therapeutic Recreation  Stress management  Discharge Criteria: Patient will be discharged from PT if patient refuses treatment 3 consecutive times without medical reason, if treatment goals not met, if there is a change in medical status, if patient makes no progress towards goals or if patient is  discharged from hospital.  The above assessment, treatment plan, treatment alternatives and goals were discussed and mutually agreed upon: by patient  Michel Santee 12/18/2017, 4:47 PM

## 2017-12-18 NOTE — Evaluation (Signed)
Occupational Therapy Assessment and Plan  Patient Details  Name: Franklin Hicks MRN: [id number] Date of Birth: 06-Jan-1951  OT Diagnosis: acute pain and paraplegia at level C6 Rehab Potential: Rehab Potential (ACUTE ONLY): Good ELOS: 28-30 days   Today's Date: 12/18/2017 OT Individual Time: 3664-4034 OT Individual Time Calculation (min): 60 min     Problem List:  Patient Active Problem List   Diagnosis Date Noted  . C4 spinal cord injury, sequela ([place]) 12/18/2017  . Tetraplegia ([place]) 12/18/2017  . Acute pulmonary embolism ([place]) 12/18/2017  . Aortic aneurysm ([place]) 12/16/2017  . Fall 12/13/2017  . Cord compression ([place]) 12/13/2017  . Abnormal CT scan, cervical spine 12/13/2017  . Chronic combined systolic and diastolic heart failure ([place]) 10/09/2016  . CAD S/P percutaneous coronary angioplasty 09/20/2016  . H/O non-ST elevation myocardial infarction (NSTEMI) 09/20/2016  . Erectile dysfunction 07/30/2015  . Morbid obesity with BMI of 50.0-59.9, adult ([place]) 07/18/2013  . History of pulmonary embolism 07/13/2013  . Angioedema of lips 07/13/2013  . Multinodular goiter 07/13/2013  . Dyslipidemia, goal LDL below 70 10/02/2011  . SOB (shortness of breath) 10/02/2011  . BPH with urinary obstruction 07/17/2010  . Diabetes mellitus type 2, controlled ([place]) 07/17/2010  . Bilateral lower extremity edema 12/08/2008  . NEUROPATHY, IDIOPATHIC PERIPHERAL NEC 05/31/2007  . Osteoarthritis 03/22/2007  . Gout 03/15/2007  . Essential hypertension 03/15/2007    Past Medical History:  Past Medical History:  Diagnosis Date  . Arthritis    "knees" (12/17/2017)  . CAD S/P percutaneous coronary angioplasty 09/20/2016   Nstemi 08/2016. Dr. Doylene Canard. DES- brilinta and asa. Requests change to [facility] cardiology; 95% Ramus -> PCI Resolute Onyx DES 2.75 x 18  . Central cord syndrome ([place]) 12/17/2017  . Chronic combined systolic and diastolic heart failure ([place]) 10/09/2016   EF 45% and grade II diastolic after  nstemi  . Diet-controlled diabetes mellitus ([place])   . DJD (degenerative joint disease)   . Gout   . High cholesterol   . Hypertension   . NSTEMI (non-ST elevated myocardial infarction) ([place]) 09/20/2016   95% Ramus - > PCI   . Obesity   . Pulmonary embolism ([place]) ~ 2015   Past Surgical History:  Past Surgical History:  Procedure Laterality Date  . CARDIAC CATHETERIZATION N/A 09/19/2016   Procedure: Left Heart Cath and Coronary Angiography;  Surgeon: Dixie Dials, MD;  Location: [place] [facility];  Service: Cardiovascular: 95% proximal Ramus Intermedius --> PCI  . CARDIAC CATHETERIZATION N/A 09/19/2016   Procedure: Coronary Stent Intervention;  Surgeon: Nelva Bush, MD;  Location: [place] [facility];  Service: Cardiovascular: 95% ramus intermedius;  Resolute Onyx 2.75 x 18 mm drug-eluting stent  . CYSTOSCOPY/RETROGRADE/URETEROSCOPY/STONE EXTRACTION WITH BASKET  7425,9563   ureteral stone   . MENISCUS REPAIR Left    knee open meniscetomy  . TRANSTHORACIC ECHOCARDIOGRAM  2004   no lvh nl ejection fraction  . TRANSTHORACIC ECHOCARDIOGRAM  09/19/2016   In setting of NSTEMI:  EF 45-50% with diffuse hypokinesis. GR 2 DD. Mild biatrial enlargement.    Assessment & Plan Clinical Impression: Franklin Hicks is a 67 year old male with history of CAD with chronic combined CHF, T2DM, morbid obesity, hypertension, PE in the past; who was admitted on 12/13/2017 after falling forwards in the bathroom with inability to move. He reported numbness and tingling bilateral hands as well as bilateral lower extremities. MRI of cervical spine done showing multilevel DJD with moderate canal stenosis C4-5, severe left C5-6 and severe right stenosis  C6-7 with increase in cord signal at C4. He was evaluated by Dr. Vertell Limber who recommended steroids to help manage central cord injury as well as extensive rehab and collar for stabilization. He developed pleuritic chest pain on 4-23 and was found to have acute PE and right  main pulmonary artery extending to multiple segmental arteries and stable 4 cm ascending aortic aneurysm. He was started on IV heparin for treatment. NS to discuss ACDF after rehab complete? Therapy ongoing and working on pregait activity. CIR recommended due to functional deficits.  Patient transferred to CIR on 12/17/2017 .    Patient currently requires total with basic self-care skills secondary to muscle weakness and muscle joint tightness, decreased cardiorespiratoy endurance, unbalanced muscle activation and decreased coordination and decreased sitting balance and decreased standing balance.  Prior to hospitalization, patient was independent with his self care and was driving.  Patient will benefit from skilled intervention to increase independence with basic self-care skills prior to discharge home with care partner.  Anticipate patient will require minimal physical assistance and follow up home health.  OT - End of Session Activity Tolerance: Tolerates 10 - 20 min activity with multiple rests OT Assessment Rehab Potential (ACUTE ONLY): Good OT Barriers to Discharge: Inaccessible home environment;Home environment access/layout;Other (comments) OT Barriers to Discharge Comments: pt's bedroom and full bath is up 6 stairs;  Prior history with L knee pain, weakness, and stiffness - impedes ability to stand OT Patient demonstrates impairments in the following area(s): Balance;Endurance;Motor;Pain;Sensory OT Basic ADL's Functional Problem(s): Eating;Grooming;Bathing;Dressing;Toileting OT Transfers Functional Problem(s): Toilet OT Additional Impairment(s): Fuctional Use of Upper Extremity OT Plan OT Intensity: Minimum of 1-2 x/day, 45 to 90 minutes OT Frequency: 5 out of 7 days OT Duration/Estimated Length of Stay: 28-30 days OT Treatment/Interventions: Balance/vestibular training;Discharge planning;DME/adaptive equipment instruction;Functional mobility training;Neuromuscular  re-education;Patient/family education;Psychosocial support;Pain management;Self Care/advanced ADL retraining;Therapeutic Activities;Therapeutic Exercise;UE/LE Strength taining/ROM;UE/LE Coordination activities OT Self Feeding Anticipated Outcome(s): mod I OT Basic Self-Care Anticipated Outcome(s): min A with bathing and dressing OT Toileting Anticipated Outcome(s): min A OT Bathroom Transfers Anticipated Outcome(s): min A to toilet OT Recommendation Patient destination: Home Follow Up Recommendations: Home health OT Equipment Recommended: Tub/shower bench;3 in 1 bedside comode   Skilled Therapeutic Intervention Pt seen for initial evaluation and self care training. Explained purpose of OT and pt discussed his goals.  Most of the session involved rolling in bed for LB self care, sitting to EOB for UB self care.  Obtained a Clarise Cruz lift as Acute care PT note indicated pt needed 3 people to A him to stand in stedy.  Attempted 2x to have pt stand but sling around his back kept sliding up and it was very difficult to achieve a good foot position as his L knee would not flex to 90 degrees.  Recommended to his next therapist to try a harness sling with the [facility] lift. Pt did well sitting EOB.  Due to very limited AROM he requires total A with bathing and dressing. He does have fair grasp strength of 4 in R hand and 2- in L hand.  Pt adjusted back into supine in bed to rest. His daughter assisted 1x and then the RN assisted 2nd time with scooting him up in the bed with bed in reverse.  Pt resting in bed with all needs met.  OT Evaluation Precautions/Restrictions  Precautions Precautions: Cervical Required Braces or Orthoses: Cervical Brace Cervical Brace: Hard collar Restrictions Weight Bearing Restrictions: No Pain Pain Assessment Pain Scale: 0-10 Pain Score: 6  Pain Type: Neuropathic pain Pain Location: Arm Pain Orientation: Right;Left Pain Descriptors / Indicators: Numbness;Tingling Pain Onset:  On-going Home Living/Prior Functioning Home Living Family/patient expects to be discharged to:: Private residence Living Arrangements: Spouse/significant other, Other relatives Available Help at Discharge: Family Type of Home: House Home Access: Stairs to enter [employer] brewer of Steps: 6 Entrance Stairs-Rails: Right, Left Home Layout: Multi-level, Laundry or work area in basement, Other (Comment)(split level, BR and bath upstairs, renovated basement downstairs) Alternate Level Stairs-Number of Steps: 6 Bathroom Shower/Tub: Tub/shower unit(upstairs) Additional Comments: back entrance with 2 steps that would get you to the renovated basement  Lives With: Spouse, Family Prior Function Level of Independence: Independent with basic ADLs, Independent with transfers, Requires assistive device for independence, Other (comment)(use 1 cane in the house, 2 outside due to weak L knee)  Able to Take Stairs?: Yes Driving: Yes Vocation: Retired Comments: drives, retired Tourist information centre manager (taught PE) ADL ADL ADL Comments: refer to functional navigator Vision Baseline Vision/History: Wears glasses Wears Glasses: At all times Patient Visual Report: No change from baseline Vision Assessment?: No apparent visual deficits Perception  Perception: Within Functional Limits Praxis Praxis: Intact Cognition Overall Cognitive Status: Within Functional Limits for tasks assessed Arousal/Alertness: Awake/alert Orientation Level: Person;Place;Situation Person: Oriented Place: Oriented Situation: Oriented Year: 2019 Month: April Day of Week: Correct Memory: Appears intact Immediate Memory Recall: Sock;Blue;Bed Memory Recall: Sock;Blue;Bed Memory Recall Sock: Without Cue Memory Recall Blue: Without Cue Memory Recall Bed: Without Cue Awareness: Appears intact Problem Solving: Appears intact Safety/Judgment: Appears intact Sensation Sensation Light Touch: Impaired by gross assessment(c/o numbness/  tingling over B biceps and hands) Stereognosis: Impaired by gross assessment Hot/Cold: Not tested Proprioception: Not tested Coordination Gross Motor Movements are Fluid and Coordinated: No Fine Motor Movements are Fluid and Coordinated: No Coordination and Movement Description: very limited AROM in BUE/BLE; limited PROM of left knee Finger Nose Finger Test: not tested due to impaired shoulder strength Motor  Motor Motor: Tetraplegia Mobility    refer to functional navigator Trunk/Postural Assessment  Cervical Assessment Cervical Assessment: Exceptions to WFL(in cervical collar) Thoracic Assessment Thoracic Assessment: Within Functional Limits Lumbar Assessment Lumbar Assessment: Within Functional Limits Postural Control Postural Control: (functional for static sit only, mod A dynamic)  Balance Static Sitting Balance Static Sitting - Level of Assistance: 5: Stand by assistance Dynamic Sitting Balance Dynamic Sitting - Level of Assistance: 3: Mod assist Sitting balance - Comments: mod A to reach out of base of support Static Standing Balance Static Standing - Level of Assistance: (attempted with Clarise Cruz lift, unable to achieve this am) Extremity/Trunk Assessment RUE Assessment RUE Assessment: Exceptions to [facility] RUE AROM (degrees) Right Shoulder Flexion: 100 Degrees RUE Strength Right Shoulder Flexion: 2-/5 Right Elbow Flexion: 4-/5 Right Elbow Extension: 4-/5 Right Hand Gross Grasp: Impaired(4-/5 ) LUE Assessment LUE Assessment: Exceptions to WFL LUE AROM (degrees) Left Shoulder Flexion: 80 Degrees LUE Strength Left Elbow Flexion: 4/5 Left Elbow Extension: 4/5 Left Hand Gross Grasp: Impaired(2-/5)   See Function Navigator for Current Functional Status.   Refer to Care Plan for Long Term Goals  Recommendations for other services: None    Discharge Criteria: Patient will be discharged from OT if patient refuses treatment 3 consecutive times without medical reason,  if treatment goals not met, if there is a change in medical status, if patient makes no progress towards goals or if patient is discharged from hospital.  The above assessment, treatment plan, treatment alternatives and goals were discussed and mutually agreed upon: by patient and by  family  [place] 12/18/2017, 2:15 PM

## 2017-12-18 NOTE — Progress Notes (Signed)
[facility] PHYSICAL MEDICINE & REHABILITATION     PROGRESS NOTE    Subjective/Complaints: Had a good night. Reports no problems. Pain controlled. Breathing without issue  ROS: Patient denies fever, rash, sore throat, blurred vision, nausea, vomiting, diarrhea, cough, shortness of breath or chest pain, joint or back pain, headache, or mood change.   Objective: Vital Signs: Blood pressure 119/65, pulse 68, temperature 98.8 F (37.1 C), temperature source Oral, resp. rate 16, height 6' (1.829 m), weight (!) 170.4 kg (375 lb 10.6 oz), SpO2 95 %. No results found. Recent Labs    12/16/17 0332 12/18/17 0444  WBC 13.9* 10.2  HGB 13.8 13.3  HCT 42.7 41.4  PLT 206 192   Recent Labs    12/17/17 0706 12/18/17 0444  NA 135 136  K 4.0 4.0  CL 103 103  GLUCOSE 113* 133*  BUN 19 17  CREATININE 0.96 1.08  CALCIUM 8.3* 8.3*   CBG (last 3)  Recent Labs    12/17/17 1838 12/17/17 2100 12/18/17 0656  GLUCAP 126* 101* 126*    Wt Readings from Last 3 Encounters:  12/17/17 (!) 170.4 kg (375 lb 10.6 oz)  12/15/17 (!) 173.1 kg (381 lb 9.9 oz)  09/24/17 (!) 160 kg (352 lb 12.8 oz)    Physical Exam:   Constitutional: No distress . Vital signs reviewed. obese HEENT: EOMI, oral membranes moist Neck: supple, in aspen collar Cardiovascular: RRR without murmur. No JVD    Respiratory: CTA Bilaterally without wheezes or rales. Normal effort    GI: BS +, non-tender, non-distended  Musculoskeletal: He exhibits edema.  Min edema BLE.  Neurological: He is alert and oriented to person, place, and time. No cranial nerve deficit.  Alert and appropriate. LUE 2 to 2+/5 prox to distal. RUE 3 to 3+/5. LLE 2/5 with inhibition due to knee injury. RLE 3+/5. Senses pain and gross touch in all 4's. --motor and sensory exam are stable Skin: Skin is warm and dry. He is not diaphoretic.  Psychiatric: He has a normal mood and affect. His behavior is normal. Judgment and thought content normal.      Assessment/Plan: 1. Functional deficits and tetraplegia secondary to C4 spinal cord injury which require 3+ hours per day of interdisciplinary therapy in a comprehensive inpatient rehab setting. Physiatrist is providing close team supervision and 24 hour management of active medical problems listed below. Physiatrist and rehab team continue to assess barriers to discharge/monitor patient progress toward functional and medical goals.  Function:  Bathing Bathing position      Bathing parts      Bathing assist        Upper Body Dressing/Undressing Upper body dressing                    Upper body assist        Lower Body Dressing/Undressing Lower body dressing                                  Lower body assist        Toileting Toileting          Toileting assist     Transfers Chair/bed transfer             Locomotion Ambulation           Wheelchair          Cognition Comprehension Comprehension assist level: Follows complex conversation/direction with extra  time/assistive device  Expression Expression assist level: Expresses complex ideas: With extra time/assistive device  Social Interaction Social Interaction assist level: Interacts appropriately 90% of the time - Needs monitoring or encouragement for participation or interaction.  Problem Solving    Memory Memory assist level: Recognizes or recalls 90% of the time/requires cueing < 10% of the time  Medical Problem List and Plan:  1. Functional deficits secondary to C4 Spinal cord injury, motor and sensory incomplete  -beginning therapies today  2. DVT/PE/Anticoagulation: Pharmaceutical: Heparin at present---transition to xarelto---have consulted pharmacy for assistance  3. Pain Management: managed with prn medications.  4. Mood: LCSW to follow for evaluation and support.  5. Neuropsych: This patient is capable of making decisions on his own behalf.  6. Skin/Wound Care:  Routine pressure relief measures--order air mattress due to limited mobility and body habitus. Maintain adequate nutritional and hydration status.  7. Fluids/Electrolytes/Nutrition: encourage PO   -I personally reviewed the patient's labs today.  8. T2DM: Monitor BS ac/hs and use SSI for elevated BS. Continue to titrate medications for tighter control.   -fair control over the last 24 hours 9. CAD: Managed with Lipitor, Plavix, and metoprolol  10. Morbid obesity: Body mass index is 51.76 kg/m.  Diet and exercise education  Encourage weight loss to help with mobility/endurance and promote overall health  11. Combined systolic/diastolic CHF: Monitor weights daily. Continue Lasix, Plavix, statin and metoprolol.    - Filed Weights   12/17/17 1825  Weight: (!) 170.4 kg (375 lb 10.6 oz)   12. HTN: Monitor BP bid. Continue terazosin and furosemide.  13. H/o BPH: Reports voiding without difficulty--on Hytrin.   14. Bowel and bladder: appears to be continent    -working on OOB to void    LOS (Days) 1 A FACE TO FACE EVALUATION WAS PERFORMED  Ranelle OysterZachary T Swartz, MD 12/18/2017 11:30 AM

## 2017-12-18 NOTE — Progress Notes (Signed)
Social Work  Social Work Assessment and Plan  Patient Details  Name: Franklin Hicks MRN: [id number] Date of Birth: 01/14/1951  Today's Date: 12/18/2017  Problem List:  Patient Active Problem List   Diagnosis Date Noted  . C4 spinal cord injury, sequela (HCC) 12/18/2017  . Tetraplegia (HCC) 12/18/2017  . Acute pulmonary embolism (HCC) 12/18/2017  . Aortic aneurysm (HCC) 12/16/2017  . Fall 12/13/2017  . Cord compression (HCC) 12/13/2017  . Abnormal CT scan, cervical spine 12/13/2017  . Chronic combined systolic and diastolic heart failure (HCC) 10/09/2016  . CAD S/P percutaneous coronary angioplasty 09/20/2016  . H/O non-ST elevation myocardial infarction (NSTEMI) 09/20/2016  . Erectile dysfunction 07/30/2015  . Morbid obesity with BMI of 50.0-59.9, adult (HCC) 07/18/2013  . History of pulmonary embolism 07/13/2013  . Angioedema of lips 07/13/2013  . Multinodular goiter 07/13/2013  . Dyslipidemia, goal LDL below 70 10/02/2011  . SOB (shortness of breath) 10/02/2011  . BPH with urinary obstruction 07/17/2010  . Diabetes mellitus type 2, controlled (HCC) 07/17/2010  . Bilateral lower extremity edema 12/08/2008  . NEUROPATHY, IDIOPATHIC PERIPHERAL NEC 05/31/2007  . Osteoarthritis 03/22/2007  . Gout 03/15/2007  . Essential hypertension 03/15/2007   Past Medical History:  Past Medical History:  Diagnosis Date  . Arthritis    "knees" (12/17/2017)  . CAD S/P percutaneous coronary angioplasty 09/20/2016   Nstemi 08/2016. Dr. Algie CofferKadakia. DES- brilinta and asa. [facility]; 95% Ramus -> PCI Resolute Onyx DES 2.75 x 18  . Central cord syndrome (HCC) 12/17/2017  . Chronic combined systolic and diastolic heart failure (HCC) 10/09/2016   EF 45% and grade II diastolic after nstemi  . Diet-controlled diabetes mellitus (HCC)   . DJD (degenerative joint disease)   . Gout   . High cholesterol   . Hypertension   . NSTEMI (non-ST elevated myocardial infarction) (HCC) 09/20/2016    95% Ramus - > PCI   . Obesity   . Pulmonary embolism (HCC) ~ 2015   Past Surgical History:  Past Surgical History:  Procedure Laterality Date  . CARDIAC CATHETERIZATION N/A 09/19/2016   Procedure: Left Heart Cath and Coronary Angiography;  Surgeon: Orpah CobbAjay Kadakia, MD;  Location: [facility] INVASIVE CV LAB;  Service: Cardiovascular: 95% proximal Ramus Intermedius --> PCI  . CARDIAC CATHETERIZATION N/A 09/19/2016   Procedure: Coronary Stent Intervention;  Surgeon: Yvonne Kendallhristopher End, MD;  [facility];  Service: Cardiovascular: 95% ramus intermedius;  Resolute Onyx 2.75 x 18 mm drug-eluting stent  . CYSTOSCOPY/RETROGRADE/URETEROSCOPY/STONE EXTRACTION WITH BASKET  [id number]   ureteral stone   . MENISCUS REPAIR Left    knee open meniscetomy  . TRANSTHORACIC ECHOCARDIOGRAM  2004   no lvh nl ejection fraction  . TRANSTHORACIC ECHOCARDIOGRAM  09/19/2016   In setting of NSTEMI:  EF 45-50% with diffuse hypokinesis. GR 2 DD. Mild biatrial enlargement.   Social History:  reports that he quit smoking about 40 years ago. His smoking use included cigarettes. He has a 13.00 pack-year smoking history. He has never used smokeless tobacco. He reports that he drinks alcohol. He reports that he does not use drugs.  Family / Support Systems Marital Status: Married Patient Roles: Spouse, Parent Spouse/Significant Other: wife, Raquel SarnaSandra Hanko @ (H) [phone] or [phone]) [phone] Children: daughter, Jerrye NobleShannon Caldwell (local) @ (C) [phone] and two sons living in [place] and one incarcerated Other Supports: sister-in-law who also lives in the home Anticipated Caregiver: Wife, daughter, additional family Ability/Limitations of Caregiver: Wife and sister-in-law are retired and can assist.  Other family can assist as needed. Caregiver Availability: 24/7 Family Dynamics: Patient describes family is very supportive.  Daughter is not currently working and can provide assistance as well as sister-in-law who  is in the home.  Social History Preferred language: English Religion: Baptist Cultural Background: NA Education: College Read: Yes Write: Yes Employment Status: Retired Date Retired/Disabled/Unemployed: 2010 Fish farm manager Issues: None Guardian/Conservator: None-Per MD, patient is capable of making decisions on his own behalf   Abuse/Neglect Abuse/Neglect Assessment Can Be Completed: Yes Physical Abuse: Denies Verbal Abuse: Denies Sexual Abuse: Denies Exploitation of patient/patient's resources: Denies Self-Neglect: Denies  Emotional Status Pt's affect, behavior adn adjustment status: Patient very pleasant, sitting up in bed and able to complete assessment interview without any difficulty.  When talking or being questioned about his mood he states, "I am actually doing okay.  I am going to get through this."  Patient denies any emotional distress, however, will monitor and refer for neuropsych support as indicated.  Patient is very pleasant and states he is motivated for CIR.  Very appreciative for any support that I can offer. Recent Psychosocial Issues: None Pyschiatric History: None Substance Abuse History: None  Patient / Family Perceptions, Expectations & Goals Pt/Family understanding of illness & functional limitations: Patient and family with good, basic understanding of his cervical spinal cord injury and the resulting functional deficits.  Patient reports that he understands he may see some return of function as swelling decreases and hopeful that therapy will also be a significant difference in his recovery. Premorbid pt/family roles/activities: Patient was independent with ambulation, however, did use 2 canes with mobility. Anticipated changes in roles/activities/participation: Patient anticipated to need hands on, physical support likely at a wheelchair level upon discharge.  Wife to assume role as primary caregiver with additional support from other family  members. Pt/family expectations/goals: "I just hope I can get as much return as I can."  [employer]: None Premorbid Home Care/DME Agencies: None Transportation available at discharge: Yes Resource referrals recommended: Neuropsychology  Discharge Planning Living Arrangements: Spouse/significant other, Other relatives Support Systems: Spouse/significant other, Other relatives, Manufacturing engineer, Psychologist, clinical community Type of Residence: Private residence Insurance Resources: Medicare(UHC Medicare) Financial Resources: Restaurant manager, fast food Screen Referred: No Living Expenses: Own Money Management: Patient Does the patient have any problems obtaining your medications?: No Home Management: pt and family share responsibilities Patient/Family Preliminary Plans: Patient and family only intend for patient to discharge home.  Family to provide any level of assistance needed. Sw Barriers to Discharge: Weight Sw Barriers to Discharge Comments: This social worker concerned that patient's assistance needs combined with his size/weight may be prohibitive to family being able to manage his care-we will monitor. Social Work Anticipated Follow Up Needs: HH/OP Expected length of stay: 20-27 days  Clinical Impression Very unfortunate gentleman here on CIR following a fall at home and suffering a cervical SCI with resulting central cord syndrome.  Comorbidities include obesity which could impact family's ability to provide care at home dependent on assistance level needed at time of discharge.  Patient is, however, very motivated for CIR and optimistic.  He denies any significant emotional distress and states "I am good."  Family very supportive and willing to provide assistance and 24/7 care is available.  Social work to follow for support and discharge planning needs.  Oleta Gunnoe 12/18/2017, 4:22 PM

## 2017-12-18 NOTE — Progress Notes (Signed)
ANTICOAGULATION CONSULT NOTE - Follow Up Consult  Pharmacy Consult for Heparin Indication: pulmonary embolus  Allergies  Allergen Reactions  . Ace Inhibitors     Angioedema  . Other Hives and Itching    msg    Patient Measurements: Height: 6' (182.9 cm) Weight: (!) 375 lb 10.6 oz (170.4 kg) IBW/kg (Calculated) : 77.6 Heparin Dosing Weight: 123 kg  Vital Signs: Temp: 97.7 F (36.5 C) (04/25 1825) Temp Source: Oral (04/25 1825) BP: 134/61 (04/25 2058) Pulse Rate: 61 (04/25 2058)  Labs: Recent Labs    12/16/17 0332  12/16/17 2317 12/17/17 0706 12/17/17 1515 12/17/17 2249  HGB 13.8  --   --   --   --   --   HCT 42.7  --   --   --   --   --   PLT 206  --   --   --   --   --   HEPARINUNFRC  --    < >  --  0.81* 0.92* 0.60  CREATININE 1.04  --  1.03 0.96  --   --    < > = values in this interval not displayed.    Estimated Creatinine Clearance: 119.5 mL/min (by C-G formula based on SCr of 0.96 mg/dL).   Assessment: Anticoag: + new PE per CT, DDimer 12, no AC PTA Heparin level therapeutic x 1 after rate decrease  Goal of Therapy:  Heparin level 0.3-0.7 units/ml Monitor platelets by anticoagulation protocol: Yes   Plan:  Cont IV heparin at 1600 units/hr Recheck heparin level with AM labs Daily HL and CBC  Abran DukeJames Hau Sanor, PharmD, BCPS Clinical Pharmacist Phone: [phone]

## 2017-12-18 NOTE — Progress Notes (Signed)
ANTICOAGULATION CONSULT NOTE - Follow Up Consult  Pharmacy Consult for Heparin Indication: pulmonary embolus  Allergies  Allergen Reactions  . Ace Inhibitors     Angioedema  . Other Hives and Itching    msg    Patient Measurements: Height: 6' (182.9 cm) Weight: (!) 375 lb 10.6 oz (170.4 kg) IBW/kg (Calculated) : 77.6 Heparin Dosing Weight: 123 kg  Vital Signs: Temp: 98.8 F (37.1 C) (04/26 0214) Temp Source: Oral (04/26 0214) BP: 119/65 (04/26 0214) Pulse Rate: 68 (04/26 0214)  Labs: Recent Labs    12/16/17 0332  12/16/17 2317 12/17/17 0706 12/17/17 1515 12/17/17 2249 12/18/17 0444  HGB 13.8  --   --   --   --   --  13.3  HCT 42.7  --   --   --   --   --  41.4  PLT 206  --   --   --   --   --  192  HEPARINUNFRC  --    < >  --  0.81* 0.92* 0.60 0.49  CREATININE 1.04  --  1.03 0.96  --   --  1.08   < > = values in this interval not displayed.    Estimated Creatinine Clearance: 106.2 mL/min (by C-G formula based on SCr of 1.08 mg/dL).   Assessment: [age] YOM with spinal cord injury and developed new PE prior to CIR admission. On heparin infusion, level therapeutic this morning, pharmacy is consulted to transition heparin to xarelto. Hgb 13.3, pltc 192K, stable, scr 1.08, est. crcl > 100 ml/min   Goal of Therapy:  Monitor platelets by anticoagulation protocol: Yes   Plan:  Xarelto 15 mg PO BID through 5/16. Then start xarelto 20mg  daily on 5/17 Stop heparin when first dose xarelto is given.  Monitor CBC.  Bayard HuggerMei Wildon Cuevas, PharmD, BCPS  Clinical Pharmacist  Pager: [phone]

## 2017-12-18 NOTE — Progress Notes (Signed)
Patient information reviewed and entered into eRehab system by Rhylei Mcquaig, RN, CRRN, PPS Coordinator.  Information including medical coding and functional independence measure will be reviewed and updated through discharge.     Per nursing patient was given "Data Collection Information Summary for Patients in Inpatient Rehabilitation Facilities with attached "Privacy Act Statement-Health Care Records" upon admission.  

## 2017-12-18 NOTE — H&P (Signed)
Physical Medicine and Rehabilitation Admission H&P     Chief Complaint  Patient presents with  . Central cord injury  HPI: Franklin Hicks is a 67 year old male with history of CAD with chronic combined CHF, T2DM, morbid obesity, hypertension, PE in the past; who was admitted on 12/13/2017 after falling forwards in the bathroom with inability to move. He reported numbness and tingling bilateral hands as well as bilateral lower extremities. MRI of cervical spine done showing multilevel DJD with moderate canal stenosis C4-5, severe left C5-6 and severe right stenosis C6-7 with increase in cord signal at C4. He was evaluated by Dr. Venetia Maxon who recommended steroids to help manage central cord injury as well as extensive rehab and collar for stabilization. He developed pleuritic chest pain on 4-23 and was found to have acute PE and right main pulmonary artery extending to multiple segmental arteries and stable 4 cm ascending aortic aneurysm. He was started on IV heparin for treatment. NS to discuss ACDF after rehab complete? Therapy ongoing and working on pregait activity. CIR recommended due to functional deficits.  Review of Systems  Constitutional: Negative for chills and fever.  HENT: Negative for hearing loss and tinnitus.  Eyes: Negative for blurred vision and double vision.  Respiratory: Positive for shortness of breath. Negative for cough.  Cardiovascular: Positive for chest pain and leg swelling.  Gastrointestinal: Negative for constipation, heartburn and nausea.  Genitourinary: Negative for dysuria and urgency.  Musculoskeletal: Positive for back pain, joint pain (has bad knees) and myalgias.  Skin: Negative for itching and rash.  Neurological: Positive for sensory change (LUE "tender" to touch. ) and focal weakness. Negative for dizziness.  Psychiatric/Behavioral: Negative for depression. The patient is nervous/anxious. The patient does not have insomnia.       Past Medical History:  Diagnosis  Date  . CAD S/P percutaneous coronary angioplasty 09/20/2016   Nstemi 08/2016. Dr. Algie Coffer. DES- brilinta and asa. Requests change to [facility] cardiology; 95% Ramus -> PCI Resolute Onyx DES 2.75 x 18  . Chronic combined systolic and diastolic heart failure (HCC) 10/09/2016   EF 45% and grade II diastolic after nstemi  . Diabetes mellitus without complication (HCC)   . DJD (degenerative joint disease)   . Gout   . H/O non-ST elevation myocardial infarction (NSTEMI) 09/20/2016   95% Ramus - > PCI   . Hypertension   . Obesity         Past Surgical History:  Procedure Laterality Date  . CARDIAC CATHETERIZATION N/A 09/19/2016   Procedure: Left Heart Cath and Coronary Angiography; Surgeon: Orpah Cobb, MD; Location: [facility] INVASIVE CV LAB; Service: Cardiovascular: 95% proximal Ramus Intermedius --> PCI  . CARDIAC CATHETERIZATION N/A 09/19/2016   Procedure: Coronary Stent Intervention; Surgeon: Yvonne Kendall, MD; Location: [facility] INVASIVE CV LAB; Service: Cardiovascular: 95% ramus intermedius; Resolute Onyx 2.75 x 18 mm drug-eluting stent  . left knee open meniscetomy    . TRANSTHORACIC ECHOCARDIOGRAM  2004   no lvh nl ejection fraction  . TRANSTHORACIC ECHOCARDIOGRAM  09/19/2016   In setting of NSTEMI: EF 45-50% with diffuse hypokinesis. GR 2 DD. Mild biatrial enlargement.  . ureteral stone  [id number]        Family History  Problem Relation Age of Onset  . Liver disease Mother    Social History: Married. Independent with 2 canes PTA. Family who lives with them manages home/meals. He reports that he quit smoking about 40 years ago. His smoking use included cigarettes. He has a 13.00 pack-year smoking history.  He has never used smokeless tobacco. He reports that he drinks alcohol. He reports that he does not use drugs.       Allergies  Allergen Reactions  . Ace Inhibitors     Angioedema  . Other Hives and Itching    msg         Medications Prior to Admission  Medication Sig Dispense Refill  .  atorvastatin (LIPITOR) 80 MG tablet TAKE 1 TABLET (80 MG TOTAL) BY MOUTH DAILY AT 6 PM. 30 tablet 3  . Chromium 200 MCG CAPS Take 200 mcg by mouth daily.     Marland Kitchen CINNAMON PO Take 1 capsule by mouth daily.    . clopidogrel (PLAVIX) 75 MG tablet Take 1 tablet (75 mg total) by mouth daily. 30 tablet 11  . furosemide (LASIX) 20 MG tablet Take 1 tablet (20 mg total) by mouth every Monday, Wednesday, and Friday. 15 tablet 3  . Krill Oil 1000 MG CAPS Take 1,000 mg by mouth daily.     . metoprolol tartrate (LOPRESSOR) 50 MG tablet Take 1 tablet (50 mg total) by mouth 2 (two) times daily. 60 tablet 3  . Multiple Vitamin (MULTIVITAMIN WITH MINERALS) TABS tablet Take 1 tablet by mouth daily.    . Potassium Citrate 15 MEQ (1620 MG) TBCR Take 1 tablet by mouth 2 (two) times daily.  11  . terazosin (HYTRIN) 10 MG capsule TAKE 1 CAPSULE (10 MG TOTAL) BY MOUTH AT BEDTIME. (Patient taking differently: Take 10 mg by mouth at bedtime. TAKE 1 CAPSULE (10 MG TOTAL) BY MOUTH AT BEDTIME.) 30 capsule 5  . vitamin B-12 (CYANOCOBALAMIN) 100 MCG tablet Take 100 mcg by mouth daily.     Drug Regimen Review  Drug regimen was reviewed and remains appropriate with no significant issues identified  Home:  Home Living  Family/patient expects to be discharged to:: Private residence  Living Arrangements: Spouse/significant other  Available Help at Discharge: Family  Type of Home: House  Home Access: Stairs to enter  [employer] of Steps: 6(2 in the back to get onto main level wiht bedroom)  Home Layout: Multi-level, Able to live on main level with bedroom/bathroom, Laundry or work area in basement  Alternate Teacher, music of Steps: 6  Bathroom Shower/Tub: Medical sales representative: Standard  Home Equipment: Cane - single point, Grab bars - tub/shower  Additional Comments: back entrance with 2 steps that would get you to the main level where the kitchen and bed/bath are if it is not muddy they can pull  the car around to the steps.  Functional History:  Prior Function  Level of Independence: Needs assistance  Gait / Transfers Assistance Needed: walking with 2 canes all the time  ADL's / Homemaking Assistance Needed: bathed and dressed independently  Comments: drives, retired Programmer, systems (taught PE)  Functional Status:  Mobility:  Bed Mobility  Overal bed mobility: Needs Assistance  Bed Mobility: Rolling  Rolling: Mod assist, +2 for physical assistance  Sidelying to sit: Mod assist, +2 for physical assistance  Sit to sidelying: Max assist, +2 for physical assistance, +2 for safety/equipment  General bed mobility comments: Assisted trunk and L leg to roll and trunk to get up.  Transfers  Overall transfer level: Needs assistance  Equipment used: Ambulation equipment used  Transfer via Lift Equipment: Dellia Cloud  Transfers: Sit to/from Stand  Sit to Stand: Max assist(+3)  Stand pivot transfers: Max assist, +2 physical assistance, +2 safety/equipment  General transfer comment: attempted transfer by  stedy x1 with 3 person assist, but the pt unable to get his L LE under him from such a low position. So used maximove to transfer to the bed.  Ambulation/Gait  General Gait Details: unable at this time.   ADL:  ADL  Overall ADL's : Needs assistance/impaired  Eating/Feeding: Moderate assistance, With adaptive utensils, Sitting  Eating/Feeding Details (indicate cue type and reason): Pt provided with foam to build up handles of utensils as well as a lidded mug. He was able to pick up and drink from cup with supervision, but requires min A to release mug. He requires mod A to self feed with utensils at this time  Grooming: Wash/dry face, Moderate assistance, Sitting  Grooming Details (indicate cue type and reason): mod A to wash face  Upper Body Bathing: Maximal assistance, Sitting  Lower Body Bathing: Total assistance, Bed level  Upper Body Dressing : Total assistance, Sitting  Lower Body  Dressing: Total assistance, Sit to/from stand  Toilet Transfer: Maximal assistance, +2 for physical assistance, Stand-pivot, BSC  Toilet Transfer Details (indicate cue type and reason): utilized stedy for transfer  Toileting- Architect and Hygiene: Total assistance, +2 for physical assistance, Bed level  Functional mobility during ADLs: Maximal assistance, +2 for physical assistance  Cognition:  Cognition  Overall Cognitive Status: Within Functional Limits for tasks assessed  Orientation Level: Oriented X4  Cognition  Arousal/Alertness: Awake/alert  Behavior During Therapy: WFL for tasks assessed/performed  Overall Cognitive Status: Within Functional Limits for tasks assessed  General Comments: No apparent cognitive deficits noted  Blood pressure (!) 155/80, pulse 72, temperature 97.8 F (36.6 C), temperature source Oral, resp. rate 10, height 6' (1.829 m), weight (!) 173.1 kg (381 lb 9.9 oz), SpO2 97 %.  Physical Exam  Nursing note and vitals reviewed.  Constitutional: He is oriented to person, place, and time. He appears well-developed and well-nourished. No distress. Cervical collar in place.  Morbidly obese male, NAD.  HENT:  Head: Normocephalic and atraumatic.  Mouth/Throat: Oropharynx is clear and moist.  Resolving ecchymosis mid forehead with minimal edema and tender to touch. .  Eyes: Pupils are equal, round, and reactive to light. Conjunctivae are normal. Right eye exhibits no discharge. Left eye exhibits no discharge.  Bilateral periorbital ecchymosis.  Neck: No JVD present.  In C Collar  Cardiovascular: Normal rate and regular rhythm.  No murmur heard. Distant sounds.  Respiratory: Effort normal. No stridor. No respiratory distress. He has decreased breath sounds. He has no wheezes. He exhibits no tenderness.  GI: Soft. Bowel sounds are normal. He exhibits no distension.  Musculoskeletal: He exhibits edema.  Min edema BLE.  Neurological: He is alert and  oriented to person, place, and time. No cranial nerve deficit.  Alert and appropriate. LUE 2 to 2+/5 prox to distal. RUE 3 to 3+/5. LLE 2/5 with inhibition due to knee injury. RLE 3+/5. Senses pain and gross touch in all 4's.  Skin: Skin is warm and dry. He is not diaphoretic.  Psychiatric: He has a normal mood and affect. His behavior is normal. Judgment and thought content normal.   Lab Results Last 48 Hours  Imaging Results (Last 48 hours)     Medical Problem List and Plan:  1. Functional deficits secondary to C4 Spinal cord injury, motor and sensory incomplete  -admit to inpatient rehab  2. DVT/PE/Anticoagulation: Pharmaceutical: Heparin at present  3. Pain Management: managed with prn medications.  4. Mood: LCSW to follow for evaluation and support.  5. Neuropsych: This patient is capable of making decisions on his own behalf.  6. Skin/Wound Care: Routine pressure relief measures--order air mattress due to limited mobility and body habitus. Maintain adequate nutritional and hydration status.  7. Fluids/Electrolytes/Nutrition: Monitor I/O. Offer supplements prn if intake is poor. Will check lytes in am.  8. T2DM: Monitor BS ac/hs and use SSI for elevated BS. Continue to  titrate medications for tighter control.  9. CAD: Managed with Lipitor, Plavix, and metoprolol  10. Morbid obesity: Body mass index is 51.76 kg/m.  Diet and exercise education  Encourage weight loss to help with mobility/endurance and promote overall health  11. Combined systolic/diastolic CHF: Monitor weights daily. Continue Lasix, Plavix, statin and metoprolol.  12. HTN: Monitor BP bid. Continue terazosin and furosemide.  13. H/o BPH: Reports voiding without difficulty--on Hytrin.  14. Bowel and bladder: appears to be continent    Post Admission Physician Evaluation:  1. Functional deficits secondary to C4 SCI. 2. Patient is admitted to receive collaborative, interdisciplinary care between the physiatrist, rehab nursing staff, and therapy team. 3. Patient's level of medical complexity and substantial therapy needs in context of that medical necessity cannot be provided at a lesser intensity of care such as a SNF. 4. Patient has experienced substantial functional loss from his/her baseline which was documented above under the "Functional History" and "Functional Status" headings. Judging by the patient's diagnosis, physical exam, and functional history, the patient has potential for functional progress which will result in measurable gains while on inpatient rehab. These gains will be of substantial and practical use upon discharge in facilitating mobility and self-care at the household level. 5. Physiatrist will provide 24 hour management of medical needs as well as oversight of the therapy plan/treatment and provide guidance as appropriate regarding the interaction of the two. 6. The Preadmission Screening has been reviewed and patient status is unchanged unless otherwise stated above. 7. 24 hour rehab nursing will assist with bladder management, bowel management, safety, skin/wound care, disease management, medication administration, pain management and patient education and help integrate  therapy concepts, techniques,education, etc. 8. PT will assess and treat for/with: Lower extremity strength, range of motion, stamina, balance, functional mobility, safety, adaptive techniques and equipment, surgical precautions, pain control, NMR, family ed. Goals are: supervision to min/mod assist. 9. OT will assess and treat for/with: ADL's, functional mobility, safety, upper extremity strength, adaptive techniques and equipment, NMR, pain control, surgical precautions, family education. Goals are: supervision to min/mod assist. Therapy may not yet proceed with showering this patient. 10. SLP will assess and treat for/with: n/a. Goals are: n/a. 11. Case Management and Social Worker will assess and treat for psychological issues and discharge planning. 12. Team conference will be held weekly to assess progress toward goals and to determine barriers to discharge. 13. Patient will receive at least 3 hours of therapy per day at least 5 days per week. 14. ELOS: 20-27 days  15. Prognosis: excellent   I have personally performed a face to face diagnostic evaluation of this patient. Additionally, I have reviewed and concur with the physician assistant's documentation above.  Ranelle Oyster, MD, [place] [facility]  Jacquelynn Creeamela S Love, PA-C  12/17/2017

## 2017-12-19 ENCOUNTER — Inpatient Hospital Stay (HOSPITAL_COMMUNITY): Payer: Medicare Other

## 2017-12-19 ENCOUNTER — Inpatient Hospital Stay (HOSPITAL_COMMUNITY): Payer: Medicare Other | Admitting: Physical Therapy

## 2017-12-19 LAB — GLUCOSE, CAPILLARY
GLUCOSE-CAPILLARY: 119 mg/dL — AB (ref 65–99)
GLUCOSE-CAPILLARY: 164 mg/dL — AB (ref 65–99)
Glucose-Capillary: 120 mg/dL — ABNORMAL HIGH (ref 65–99)
Glucose-Capillary: 118 mg/dL — ABNORMAL HIGH (ref 65–99)

## 2017-12-19 LAB — HEPARIN LEVEL (UNFRACTIONATED): Heparin Unfractionated: 2 IU/mL — ABNORMAL HIGH (ref 0.30–0.70)

## 2017-12-19 MED ORDER — GABAPENTIN 100 MG PO CAPS
100.0000 mg | ORAL_CAPSULE | Freq: Three times a day (TID) | ORAL | Status: DC
  Administered 2017-12-19 – 2018-01-13 (×76): 100 mg via ORAL
  Filled 2017-12-19 (×76): qty 1

## 2017-12-19 NOTE — Progress Notes (Signed)
Occupational Therapy Session Note  Patient Details  Name: Franklin Hicks MRN: [id number] Date of Birth: October 03, 1950  Today's Date: 12/19/2017 OT Individual Time: 0700-0758 OT Individual Time Calculation (min): 58 min    Short Term Goals: Week 1:  OT Short Term Goal 1 (Week 1): Pt will be able to stand using a bariatric stedy lift with max A of 1. OT Short Term Goal 2 (Week 1): Pt will be able to tolerate standing in a stedy fully upright for at least 2 min. OT Short Term Goal 3 (Week 1): pt will have improved R shoulder AROM to reach to wash under his L arm. OT Short Term Goal 4 (Week 1): Pt will have improved L shoulder AROM to be able to use L hand to pull shirt over R arm. OT Short Term Goal 5 (Week 1): Pt will be able to use R hand to use utensils with set up only.  Skilled Therapeutic Interventions/Progress Updates:    1:1. Pt agreeable to bathing and dressing EOB today. Pt completes supine>sitting EOB with A to elevate trunk and management of catheter bag. Pt sits EOB with supervision while bathing allowing OT to provide HOH A to reach end ranges and facilitate grasp on wash cloth. Pt able to thread BUE into shirt with min A OT threads head and pulls shirt down back. Pt able to pull down front. Pt lies in supine to donning pants/washing peri area/buttocks. Pt rolls B with MAX A of 1 cor clothing management. Pt completes 1x15 heel slides on bed, SLR, ab/adduction against manual resistance and SAQ for BLE strengthening in prep for functional transfers/standing. Exited session with daughter present call light in reahc and all need smet Therapy Documentation Precautions:  Precautions Precautions: Cervical Required Braces or Orthoses: Cervical Brace Cervical Brace: Hard collar Restrictions Weight Bearing Restrictions: No General:   Vital Signs: Therapy Vitals Temp: 98.8 F (37.1 C) Temp Source: Oral Pulse Rate: 73 Resp: 18 BP: (!) 128/51 Patient Position (if appropriate):  Lying Oxygen Therapy SpO2: 95 % O2 Device: Room Air  See Function Navigator for Current Functional Status.   Therapy/Group: Individual Therapy  [name] 12/19/2017, 8:02 AM

## 2017-12-19 NOTE — Progress Notes (Addendum)
[place] PHYSICAL MEDICINE & REHABILITATION     PROGRESS NOTE    Subjective/Complaints: Patient states he had a good day with therapy.  Still having some burning pain in his feet and hands.  Happy to be disconnected from the IV  ROS: Patient denies fever, rash, sore throat, blurred vision, nausea, vomiting, diarrhea, cough, shortness of breath or chest pain, headache, or mood change.   Objective: Vital Signs: Blood pressure (!) 128/51, pulse 73, temperature 98.8 F (37.1 C), temperature source Oral, resp. rate 18, height 6' (1.829 m), weight (!) 170.2 kg (375 lb 3.2 oz), SpO2 95 %. No results found. Recent Labs    12/18/17 0444  WBC 10.2  HGB 13.3  HCT 41.4  PLT 192   Recent Labs    12/17/17 0706 12/18/17 0444  NA 135 136  K 4.0 4.0  CL 103 103  GLUCOSE 113* 133*  BUN 19 17  CREATININE 0.96 1.08  CALCIUM 8.3* 8.3*   CBG (last 3)  Recent Labs    12/18/17 1702 12/18/17 2218 12/19/17 0641  GLUCAP 135* 105* 120*    Wt Readings from Last 3 Encounters:  12/19/17 (!) 170.2 kg (375 lb 3.2 oz)  12/15/17 (!) 173.1 kg (381 lb 9.9 oz)  09/24/17 (!) 160 kg (352 lb 12.8 oz)    Physical Exam:   Constitutional: No distress . Vital signs reviewed. obese HEENT: EOMI, oral membranes moist Neck: supple Cardiovascular: RRR without murmur. No JVD    Respiratory: CTA Bilaterally without wheezes or rales. Normal effort    GI: BS +, non-tender, non-distended  Musculoskeletal: He exhibits edema.  Min edema BLE.  Neurological: He is alert and oriented to person, place, and time. No cranial nerve deficit.  Alert and appropriate. LUE 2 to 2+/5 prox to distal. RUE 3 to 3+/5. LLE 2/5 with inhibition due to knee injury. RLE 3+/5. Senses pain and gross touch in all 4's. --No change in motor or sensory exam  skin: Skin is warm and dry. He is not diaphoretic.  Psychiatric: Pleasant and appropriate.     Assessment/Plan: 1. Functional deficits and tetraplegia secondary to incomplete  C4 spinal cord injury which require 3+ hours per day of interdisciplinary therapy in a comprehensive inpatient rehab setting. Physiatrist is providing close team supervision and 24 hour management of active medical problems listed below. Physiatrist and rehab team continue to assess barriers to discharge/monitor patient progress toward functional and medical goals.  Function:  Bathing Bathing position   Position: Bed  Bathing parts Body parts bathed by patient: Chest, Abdomen Body parts bathed by helper: Right arm, Left arm, Front perineal area, Buttocks, Right upper leg, Left upper leg, Right lower leg, Left lower leg, Back  Bathing assist        Upper Body Dressing/Undressing Upper body dressing   What is the patient wearing?: Pull over shirt/dress       Pull over shirt/dress - Perfomed by helper: Thread/unthread right sleeve, Thread/unthread left sleeve, Put head through opening, Pull shirt over trunk        Upper body assist        Lower Body Dressing/Undressing Lower body dressing   What is the patient wearing?: Pants, Non-skid slipper socks       Pants- Performed by helper: Thread/unthread right pants leg, Thread/unthread left pants leg, Pull pants up/down   Non-skid slipper socks- Performed by helper: Don/doff right sock, Don/doff left sock  Lower body assist        Toileting Toileting          Toileting assist     Transfers Chair/bed transfer Chair/bed transfer activity did not occur: Safety/medical concerns           Locomotion Ambulation Ambulation activity did not occur: Safety/medical concerns         Wheelchair Wheelchair activity did not occur: Safety/medical concerns        Cognition Comprehension Comprehension assist level: Follows complex conversation/direction with no assist  Expression Expression assist level: Expresses complex ideas: With no assist  Social Interaction Social Interaction assist level:  Interacts appropriately with others - No medications needed.  Problem Solving Problem solving assist level: Solves complex problems: Recognizes & self-corrects  Memory Memory assist level: Complete Independence: No helper  Medical Problem List and Plan:  1. Functional deficits secondary to C4 Spinal cord injury, motor and sensory incomplete  -Continue therapies 2. DVT/PE/Anticoagulation: Patient now on Xarelto.  Appreciate pharmacy assistance 3. Pain Management: managed with prn medications.    -Added gabapentin 100 mg 3 times daily for neuropathic pain 4. Mood: LCSW to follow for evaluation and support.  5. Neuropsych: This patient is capable of making decisions on his own behalf.  6. Skin/Wound Care: Routine pressure relief measures--order air mattress due to limited mobility and body habitus. Maintain adequate nutritional and hydration status.  7. Fluids/Electrolytes/Nutrition: encourage PO      8. T2DM: Monitor BS ac/hs and use SSI for elevated BS. Continue to titrate medications for tighter control.   -Good control since admission 9. CAD: Managed with Lipitor, Plavix, and metoprolol  10. Morbid obesity: Body mass index is 51.76 kg/m.  Diet and exercise education ongoing   11. Combined systolic/diastolic CHF: Monitor weights daily. Continue Lasix, Plavix, statin and metoprolol.    -Weights controlled at present [facility] Weights   12/17/17 1825 12/19/17 0521  Weight: (!) 170.4 kg (375 lb 10.6 oz) (!) 170.2 kg (375 lb 3.2 oz)   12. HTN: Monitor BP bid. Continue terazosin and furosemide.  13. H/o BPH: Reports voiding without difficulty--on Hytrin.   14. Bowel and bladder: appears to be continent    -working on OOB to void.  His size is an impediment    LOS (Days) 2 A FACE TO FACE EVALUATION WAS PERFORMED  Ranelle Oyster, MD 12/19/2017 9:38 AM

## 2017-12-19 NOTE — Progress Notes (Signed)
Occupational Therapy Session Note  Patient Details  Name: Franklin Hicks MRN: [id number] Date of Birth: 28-Nov-1950  Today's Date: 12/19/2017 OT Individual Time: 7829-5621 OT Individual Time Calculation (min): 45 min    Short Term Goals: Week 1:  OT Short Term Goal 1 (Week 1): Pt will be able to stand using a bariatric stedy lift with max A of 1. OT Short Term Goal 2 (Week 1): Pt will be able to tolerate standing in a stedy fully upright for at least 2 min. OT Short Term Goal 3 (Week 1): pt will have improved R shoulder AROM to reach to wash under his L arm. OT Short Term Goal 4 (Week 1): Pt will have improved L shoulder AROM to be able to use L hand to pull shirt over R arm. OT Short Term Goal 5 (Week 1): Pt will be able to use R hand to use utensils with set up only.  Skilled Therapeutic Interventions/Progress Updates:    1:1. Pt supine in bed ready for tx. Pt supine>sitting with improved core engagement for trunk transition only min A from OT this session. Pt sits EOM for entire session with supervision. Pt engages in ball toss (close range) chest pass/bounce pass 2x20 repetitions for BUE coordinations and gross motor control. Pt with shoulder hike noted. Pt works on towel glides protraction/retraction and shoulder flexion/extension with tactile cues to relax shoulder elevation and scapular facilitation. Pt engages in variety of reaching tasks laterally and crossing midline for wooden beads to improve gross grasp and dynamic reaching in mod ranges outside BOS with no LOB. Exited session with pt seated EOB, daughter present all needs in reach, soft touch call light clipped to chest and NT aware of positioning.  Therapy Documentation Precautions:  Precautions Precautions: Cervical Required Braces or Orthoses: Cervical Brace Cervical Brace: Hard collar Restrictions Weight Bearing Restrictions: No General:   Vital Signs: Therapy Vitals Temp: 97.7 F (36.5 C) Temp Source: Oral Pulse Rate:  76 Resp: 18 BP: 137/67 Patient Position (if appropriate): Lying Oxygen Therapy SpO2: 96 % O2 Device: Room Air  See Function Navigator for Current Functional Status.   Therapy/Group: Individual Therapy  [name] 12/19/2017, 4:55 PM

## 2017-12-19 NOTE — Progress Notes (Signed)
Physical Therapy Session Note  Patient Details  Name: Franklin Hicks MRN: [id number] Date of Birth: 1951-01-06  Today's Date: 12/19/2017 PT Individual Time: 0950-1020 AND 1300-1400 PT Individual Time Calculation (min): 30 min AND 60 min   Short Term Goals: Week 1:  PT Short Term Goal 1 (Week 1): Pt will roll L to full side lying with use of bed features and +1 assist for pressure relief and to reduce burden of care for hygiene/dressing PT Short Term Goal 2 (Week 1): Pt will verbalize 1 method of pressure relief in w/c with min verbal cues PT Short Term Goal 3 (Week 1): Pt will demonstate lateral leans to elbow (R and L) and return to midline from EOB or therapy mat with min assist PT Short Term Goal 4 (Week 1): Pt will tolerate out of bed x30 minutes outside of therapy sessions  Skilled Therapeutic Interventions/Progress Updates:   Pt received supine in bed and agreeable to PT. Supine>sit transfer with max assist and moderate cues for LE management and cervical precautions.   Sitting balance EOB with lateral reaches and cross body reaches to target 2 x 6 BUE.   BLE therex sitting EOB LAQ, hip abduction, reciprocal marches, ankle PF/DF. All completed x 12 BLE within available range.   Pt left sitting EOB with call bell in reach and all needs met.    Session 2.   Pt received supine in bed and agreeable to PT. Supine>sit transfer withmod  assist  With PT to faciliate trunk position    Scooting EOB to the R with max assist. Moderate cues for improved anterior weight shift to allow improved movement of gluteal surface and for proper UE use.   Sit<>stand in stedy after 3 attempts with max assist. Limited LLE knee ROM prevents ideal positioning in stedy. Pt noted to lose grip with knee instability with second attempt to perform sit<>stand as well as with descent.   Sitting balance in stedy with static sitting and with reaching tasks slightly outside BOS x 30 min. Forward reach 2x 10 BUE and  cross body reaches 2x 10 BUE. Min assist from PT with min LOB at end range of reaching task and poor righting reactions outside BOS. Pt asymptomatic for orthostasis.   Pt returned to EOB and left sitting with call bell in reach and all needs met.               Therapy Documentation Precautions:  Precautions Precautions: Cervical Required Braces or Orthoses: Cervical Brace Cervical Brace: Hard collar Restrictions Weight Bearing Restrictions: No Pain: 3/10 in neck.   See Function Navigator for Current Functional Status.   Therapy/Group: Individual Therapy  Lorie Phenix 12/19/2017, 12:44 PM

## 2017-12-20 ENCOUNTER — Inpatient Hospital Stay (HOSPITAL_COMMUNITY): Payer: Medicare Other

## 2017-12-20 LAB — GLUCOSE, CAPILLARY
GLUCOSE-CAPILLARY: 111 mg/dL — AB (ref 65–99)
GLUCOSE-CAPILLARY: 95 mg/dL (ref 65–99)
Glucose-Capillary: 122 mg/dL — ABNORMAL HIGH (ref 65–99)
Glucose-Capillary: 156 mg/dL — ABNORMAL HIGH (ref 65–99)

## 2017-12-20 NOTE — Progress Notes (Signed)
[facility] PHYSICAL MEDICINE & REHABILITATION     PROGRESS NOTE    Subjective/Complaints: Overall patient states he is feeling fairly well.  He does report some drainage from his eyes and irritation.  Wife states that she did do some wiping of his eyes as well.  ROS: Patient denies fever, rash, sore throat, blurred vision, nausea, vomiting, diarrhea, cough, shortness of breath or chest pain, joint or back pain, headache, or mood change.    Objective: Vital Signs: Blood pressure (!) 125/52, pulse 78, temperature 98.1 F (36.7 C), temperature source Oral, resp. rate 16, height 6' (1.829 m), weight (!) 170.2 kg (375 lb 3.6 oz), SpO2 95 %. No results found. Recent Labs    12/18/17 0444  WBC 10.2  HGB 13.3  HCT 41.4  PLT 192   Recent Labs    12/18/17 0444  NA 136  K 4.0  CL 103  GLUCOSE 133*  BUN 17  CREATININE 1.08  CALCIUM 8.3*   CBG (last 3)  Recent Labs    12/19/17 1656 12/19/17 2118 12/20/17 0625  GLUCAP 118* 164* 111*    Wt Readings from Last 3 Encounters:  12/20/17 (!) 170.2 kg (375 lb 3.6 oz)  12/15/17 (!) 173.1 kg (381 lb 9.9 oz)  09/24/17 (!) 160 kg (352 lb 12.8 oz)    Physical Exam:     Constitutional: No distress . Vital signs reviewed. HEENT: EOMI, oral membranes moist.  No scleral irritation or drainage from eyes Neck: supple Cardiovascular: RRR without murmur. No JVD    Respiratory: CTA Bilaterally without wheezes or rales. Normal effort    GI: BS +, non-tender, non-distended  Musculoskeletal: He exhibits edema.  Min edema BLE.  Neurological: He is alert and oriented to person, place, and time. No cranial nerve deficit.  Alert and appropriate. LUE 2 to 2+/5 prox to distal. RUE 3 to 3+/5. LLE 2/5 with inhibition due to knee injury. RLE 3+/5. Senses pain and gross touch in all 4's. --No change in motor or sensory exam  skin: Skin is warm and dry. He is not diaphoretic.  Psychiatric: Pleasant and appropriate.     Assessment/Plan: 1.  Functional deficits and tetraplegia secondary to incomplete C4 spinal cord injury which require 3+ hours per day of interdisciplinary therapy in a comprehensive inpatient rehab setting. Physiatrist is providing close team supervision and 24 hour management of active medical problems listed below. Physiatrist and rehab team continue to assess barriers to discharge/monitor patient progress toward functional and medical goals.  Function:  Bathing Bathing position   Position: Bed  Bathing parts Body parts bathed by patient: Chest, Abdomen Body parts bathed by helper: Right arm, Left arm, Front perineal area, Buttocks, Right upper leg, Left upper leg, Right lower leg, Left lower leg, Back  Bathing assist        Upper Body Dressing/Undressing Upper body dressing   What is the patient wearing?: Pull over shirt/dress       Pull over shirt/dress - Perfomed by helper: Thread/unthread right sleeve, Thread/unthread left sleeve, Put head through opening, Pull shirt over trunk        Upper body assist        Lower Body Dressing/Undressing Lower body dressing   What is the patient wearing?: Pants, Non-skid slipper socks       Pants- Performed by helper: Thread/unthread right pants leg, Thread/unthread left pants leg, Pull pants up/down   Non-skid slipper socks- Performed by helper: Don/doff right sock, Don/doff left sock  Lower body assist        Toileting Toileting          Toileting assist     Transfers Chair/bed transfer Chair/bed transfer activity did not occur: Safety/medical concerns           Locomotion Ambulation Ambulation activity did not occur: Safety/medical concerns         Wheelchair Wheelchair activity did not occur: Safety/medical concerns        Cognition Comprehension Comprehension assist level: Follows complex conversation/direction with no assist  Expression Expression assist level: Expresses complex ideas: With no assist   Social Interaction Social Interaction assist level: Interacts appropriately with others - No medications needed.  Problem Solving Problem solving assist level: Solves complex problems: Recognizes & self-corrects  Memory Memory assist level: Complete Independence: No helper  Medical Problem List and Plan:  1. Functional deficits secondary to C4 Spinal cord injury, motor and sensory incomplete  -Continue therapies 2. DVT/PE/Anticoagulation: Patient now on Xarelto.  Appreciate pharmacy assistance 3. Pain Management: managed with prn medications.    -Continue gabapentin 100 mg 3 times daily for neuropathic pain 4. Mood: LCSW to follow for evaluation and support.  5. Neuropsych: This patient is capable of making decisions on his own behalf.  6. Skin/Wound Care: Routine pressure relief measures--order air mattress due to limited mobility and body habitus. Maintain adequate nutritional and hydration status.  7. Fluids/Electrolytes/Nutrition: encourage PO      8. T2DM: Monitor BS ac/hs and use SSI for elevated BS. Continue to titrate medications for tighter control.   -Good control ongoing 9. CAD: Managed with Lipitor, Plavix, and metoprolol  10. Morbid obesity: Body mass index is 51.76 kg/m.  Diet and exercise education ongoing   11. Combined systolic/diastolic CHF: Monitor weights daily. Continue Lasix, Plavix, statin and metoprolol.    -Weights remain controlled t Filed Weights   12/17/17 1825 12/19/17 0521 12/20/17 0447  Weight: (!) 170.4 kg (375 lb 10.6 oz) (!) 170.2 kg (375 lb 3.2 oz) (!) 170.2 kg (375 lb 3.6 oz)   12. HTN: Monitor BP bid. Continue terazosin and furosemide.  13. H/o BPH: Reports voiding without difficulty--on Hytrin.   14. Bowel and bladder: appears to be continent    -working on OOB to void.  His size is an impediment 15.  Eyes irritation: I see no signs of abnormality today.  May be an allergy.  Patient would like just to observe for now    LOS (Days) 3 A FACE  TO FACE EVALUATION WAS PERFORMED  Ranelle Oyster, MD 12/20/2017 10:18 AM

## 2017-12-20 NOTE — IPOC Note (Signed)
Overall Plan of Care [facility]) Patient Details Name: Franklin Hicks MRN: [id number] DOB: 08/28/1950  Admitting Diagnosis: C4 spinal cord injury, sequela [facility])  Hospital Problems: Principal Problem:   C4 spinal cord injury, sequela (HCC) Active Problems:   Diabetes mellitus type 2, controlled (HCC)   Chronic combined systolic and diastolic heart failure (HCC)   Tetraplegia (HCC)   Acute pulmonary embolism (HCC)     Functional Problem List: Nursing Bowel, Bladder, Edema, Endurance, Medication Management, Motor, Pain, Sensory, Skin Integrity  PT Balance, Sensory, Endurance, Motor, Pain, Safety  OT Balance, Endurance, Motor, Pain, Sensory  SLP    TR         Basic ADL's: OT Eating, Grooming, Bathing, Dressing, Toileting     Advanced  ADL's: OT       Transfers: PT Bed Mobility, Bed to Chair, Car, [employer]  OT Toilet     Locomotion: PT Stairs, Psychologist, prison and probation services, Ambulation     Additional Impairments: OT Fuctional Use of Upper Extremity  SLP        TR      Anticipated Outcomes Item Anticipated Outcome  Self Feeding mod I  Swallowing      Basic self-care  min A with bathing and dressing  Toileting  min A   Bathroom Transfers min A to toilet  Bowel/Bladder  Pt will manage bowel and bladder with min assist at discharge.   Transfers  supervision  Locomotion  min assist ambulatory  Communication     Cognition     Pain  Pt will manage pain at 3 or less on a scale of 0-10.   Safety/Judgment  Pt will remain free of falls with injury while in rehab with min assist/cues.    Therapy Plan: PT Intensity: Minimum of 1-2 x/day ,45 to 90 minutes PT Frequency: 5 out of 7 days PT Duration Estimated Length of Stay: 4 weeks OT Intensity: Minimum of 1-2 x/day, 45 to 90 minutes OT Frequency: 5 out of 7 days OT Duration/Estimated Length of Stay: 28-30 days      Team Interventions: Nursing Interventions Patient/Family Education, Bladder Management, Bowel Management,  Medication Management, Pain Management, Disease Management/Prevention, Skin Care/Wound Management, Discharge Planning  PT interventions Ambulation/gait training, Discharge planning, Functional mobility training, Psychosocial support, Therapeutic Activities, Balance/vestibular training, Neuromuscular re-education, Therapeutic Exercise, Wheelchair propulsion/positioning, DME/adaptive equipment instruction, Pain management, Splinting/orthotics, UE/LE Strength taining/ROM, Community reintegration, Development worker, international aid stimulation, Patient/family education, UE/LE Coordination activities, Stair training  OT Interventions Warden/ranger, Discharge planning, DME/adaptive equipment instruction, Functional mobility training, Neuromuscular re-education, Patient/family education, Psychosocial support, Pain management, Self Care/advanced ADL retraining, Therapeutic Activities, Therapeutic Exercise, UE/LE Strength taining/ROM, UE/LE Coordination activities  SLP Interventions    TR Interventions    SW/CM Interventions Discharge Planning, Psychosocial Support, Patient/Family Education   Barriers to Discharge MD  Medical stability  Nursing      PT Inaccessible home environment, Weight    OT Inaccessible home environment, Home environment access/layout, Other (comments) pt's bedroom and full bath is up 6 stairs;  Prior history with L knee pain, weakness, and stiffness - impedes ability to stand  SLP      SW Weight This social worker concerned that patient's assistance needs combined with his size/weight may be prohibitive to family being able to manage his care-we will monitor.   Team Discharge Planning: Destination: PT-Home ,OT- Home , SLP-  Projected Follow-up: PT-24 hour supervision/assistance, Home health PT, OT-  Home health OT, SLP-  Projected Equipment Needs: PT-To be determined, OT- Tub/shower bench, 3  in 1 bedside comode, SLP-  Equipment Details: PT- , OT-  Patient/family involved in  discharge planning: PT- Patient,  OT-Patient, Family member/caregiver, SLP-   MD ELOS: 28 days Medical Rehab Prognosis:  Excellent Assessment: The patient has been admitted for CIR therapies with the diagnosis of C4 spinal cord injury. The team will be addressing functional mobility, strength, stamina, balance, safety, adaptive techniques and equipment, self-care, bowel and bladder mgt, patient and caregiver education, NMR, pain mgt, ego support. Goals are set at  min assist for basic self-care and locomotion and supervision for transfers.  Ranelle Oyster, MD, FAAPMR      See Team Conference Notes for weekly updates to the plan of care

## 2017-12-20 NOTE — Progress Notes (Signed)
Occupational Therapy Session Note  Patient Details  Name: Franklin Hicks MRN: [id number] Date of Birth: Mar 20, 1951  Today's Date: 12/20/2017 OT Individual Time: 1430-1530 OT Individual Time Calculation (min): 60 min    Short Term Goals: Week 1:  OT Short Term Goal 1 (Week 1): Pt will be able to stand using a bariatric stedy lift with max A of 1. OT Short Term Goal 2 (Week 1): Pt will be able to tolerate standing in a stedy fully upright for at least 2 min. OT Short Term Goal 3 (Week 1): pt will have improved R shoulder AROM to reach to wash under his L arm. OT Short Term Goal 4 (Week 1): Pt will have improved L shoulder AROM to be able to use L hand to pull shirt over R arm. OT Short Term Goal 5 (Week 1): Pt will be able to use R hand to use utensils with set up only.  Skilled Therapeutic Interventions/Progress Updates:    1;1. Pt completes bed level LB dressing with MOD A for rolling B sides to advance pants past hips. Pt supine to sitting EOB with min A for trunk management. Pt practices writing with/without red foam handle and OT left pen and paper to practce writing lists of needed items and questions for MD for memory strategy. Pt plays game of sorry with MIN A for reaching with RUE and MOD A for reaching with LUE to move pieces throughout board and turn over cards. Exited session with pt seated EOB with call light in reach and friends in room. RN aware of positioning.   Therapy Documentation Precautions:  Precautions Precautions: Cervical Required Braces or Orthoses: Cervical Brace Cervical Brace: Hard collar Restrictions Weight Bearing Restrictions: No General:   Vital Signs:   See Function Navigator for Current Functional Status.   Therapy/Group: Individual Therapy  [name] 12/20/2017, 3:32 PM

## 2017-12-20 NOTE — Plan of Care (Signed)
  Problem: SCI BLADDER ELIMINATION Goal: RH STG MANAGE BLADDER WITH ASSISTANCE Description STG Manage Bladder With min Assistance  Outcome: Not Progressing; patient with condom cath

## 2017-12-21 ENCOUNTER — Inpatient Hospital Stay (HOSPITAL_COMMUNITY): Payer: Medicare Other

## 2017-12-21 ENCOUNTER — Inpatient Hospital Stay (HOSPITAL_COMMUNITY): Payer: Medicare Other | Admitting: Physical Therapy

## 2017-12-21 ENCOUNTER — Inpatient Hospital Stay (HOSPITAL_COMMUNITY): Payer: Medicare Other | Admitting: Occupational Therapy

## 2017-12-21 LAB — CBC
HEMATOCRIT: 41.5 % (ref 39.0–52.0)
Hemoglobin: 13.9 g/dL (ref 13.0–17.0)
MCH: 30.4 pg (ref 26.0–34.0)
MCHC: 33.5 g/dL (ref 30.0–36.0)
MCV: 90.8 fL (ref 78.0–100.0)
PLATELETS: 216 10*3/uL (ref 150–400)
RBC: 4.57 MIL/uL (ref 4.22–5.81)
RDW: 15 % (ref 11.5–15.5)
WBC: 10.2 10*3/uL (ref 4.0–10.5)

## 2017-12-21 LAB — BASIC METABOLIC PANEL
Anion gap: 5 (ref 5–15)
BUN: 18 mg/dL (ref 6–20)
CHLORIDE: 103 mmol/L (ref 101–111)
CO2: 27 mmol/L (ref 22–32)
CREATININE: 1.05 mg/dL (ref 0.61–1.24)
Calcium: 8.6 mg/dL — ABNORMAL LOW (ref 8.9–10.3)
GFR calc Af Amer: >60 mL/min (ref >60)
GFR calc non Af Amer: >60 mL/min (ref >60)
GLUCOSE: 122 mg/dL — AB (ref 65–99)
POTASSIUM: 4.2 mmol/L (ref 3.5–5.1)
SODIUM: 135 mmol/L (ref 135–145)

## 2017-12-21 LAB — GLUCOSE, CAPILLARY
GLUCOSE-CAPILLARY: 102 mg/dL — AB (ref 65–99)
Glucose-Capillary: 130 mg/dL — ABNORMAL HIGH (ref 65–99)
Glucose-Capillary: 152 mg/dL — ABNORMAL HIGH (ref 65–99)
Glucose-Capillary: 108 mg/dL — ABNORMAL HIGH (ref 65–99)

## 2017-12-21 NOTE — Progress Notes (Addendum)
Occupational Therapy Session Note  Patient Details  Name: Franklin Hicks MRN: [id number] Date of Birth: March 27, 1951  Today's Date: 12/21/2017 OT Individual Time: 0815-0920 OT Individual Time Calculation (min): 65 min    Skilled Therapeutic Interventions/Progress Updates: Upon approach for skilled OT, Patient in bed upon approach for therapy and stated he had limited upper extremity and left lower extremity strength.   Still, he concurred to participate and work on skills to increase his self care independennce, bed mobility, core and trunk strength and overall condiitioning.   He participated as follows:  - He preferred not to work on bathing and dressing this session as he stated that he was already dressed and clean and that he preferred to work on his upper extremity function, strength, and range of motion 'so that I can use my hands.'   -  Bed mobility this session= mod assist , especially to help get left leg off bed.  Initially patient stated he could not move it to edge of bed, but with time and cues, he was able to move it to the edge before stating he could not move it further.   This clinician helped with the rest of the movement off bed.  - Static sitting balance edge of bed with Good balance.    - Dynamic sitting balance fair- for oral care sitting edge of bed.  Also was able to sit edge of bed for bilateral upper extremity shoulder stretching and thoracic extension (patient neck, shoulders and pecs tight as he sat with head slightly flexed forward) He required min A to hold thoracic extension.   While seated he was able to wash bilateral hands with min A for throughness.   -  Bilateral upper extremity strength and range of motion - with min A to increase bilteral shoulder flexion and/or abduction, patient was able to demonstrate bilateral hand use to set himself up with toothpaste and brush to complete oral care.     -Oral care (just mentioned)    Also when forearms and/or elbows  supported on hi-lo table, patient was able to more independently use his forearms and hands to complete the activity.  Patient was able to wash his face with elbows propped on pillows (after setup).  Grand dtr arrived at the end of session.   Patient sat edge of bed with soft pad call pad pinned within reach to his shirt and his bed alarm engaged.... He stated he'd sat this way numerous times prior with approval from nursing and no balance issues.  Grand dtr was instructed to inform staff before leaving his room as he sat edge of bed and if patient wanted to ly back down.      Therapy Documentation Precautions:  Precautions Precautions: Cervical Required Braces or Orthoses: Cervical Brace Cervical Brace: Hard collar Restrictions Weight Bearing Restrictions: No Pain:     Therapy/Group: Individual Therapy  Bud Face [facility] 12/21/2017, 10:57 AM

## 2017-12-21 NOTE — Progress Notes (Signed)
Occupational Therapy Note  Patient Details  Name: Franklin Hicks MRN: [id number] Date of Birth: 16-Feb-1951  Today's Date: 12/21/2017 OT Individual Time: 1400-1430 OT Individual Time Calculation (min): 30 min   Pt denies pain Individual therapy  Pt in bed upon arrival with RN and NT present.  Initial focus on bed mobility (rolling R/L) for peri hygiene.  Pt required tot A for donning pants at bed level. Pt required mod A for bed mobility.  Pt performed supine>sitting EOB at supervision level.  Pt engaged in BUE AROM/therex to increase B shoulder ROM and independence with BADLs.    Lavone Neri [facility] 12/21/2017, 3:00 PM

## 2017-12-21 NOTE — Progress Notes (Signed)
Physical Therapy Session Note  Patient Details  Name: Franklin Hicks MRN: [id number] Date of Birth: [birth year]  Today's Date: 12/21/2017 PT Individual Time: 1030-1100 PT Individual Time Calculation (min): 30 min   Short Term Goals: Week 1:  PT Short Term Goal 1 (Week 1): Pt will roll L to full side lying with use of bed features and +1 assist for pressure relief and to reduce burden of care for hygiene/dressing PT Short Term Goal 2 (Week 1): Pt will verbalize 1 method of pressure relief in w/c with min verbal cues PT Short Term Goal 3 (Week 1): Pt will demonstate lateral leans to elbow (R and L) and return to midline from EOB or therapy mat with min assist PT Short Term Goal 4 (Week 1): Pt will tolerate out of bed x30 minutes outside of therapy sessions  Skilled Therapeutic Interventions/Progress Updates:    no c/o pain except "the normal stuff".  Session focus on problem solving for sit<>stand transfers.  Pt able to come to sitting EOB with supervision and increased time, using elevated HOB and bed rails.  Discussed positioning pillow underneath LLE for improved EOB tolerance, with successful trial, however L knee with reduced ROM with this set up.  Also attempted trial with bariatric stedy, however due to pt's decreased ROM in L knee unable to position stedy safely.  Discussed trial in later session with PROM/AROM of L knee prior to getting to EOB and potentially discussing with medical team about switching style of patient's bed for improved mobility.  Pt left seated EOB to await OT. Call bell in reach and needs met.   Therapy Documentation Precautions:  Precautions Precautions: Cervical Required Braces or Orthoses: Cervical Brace Cervical Brace: Hard collar Restrictions Weight Bearing Restrictions: No   See Function Navigator for Current Functional Status.   Therapy/Group: Individual Therapy  Michel Santee 12/21/2017, 12:01 PM

## 2017-12-21 NOTE — Progress Notes (Signed)
Occupational Therapy Session Note  Patient Details  Name: Franklin Hicks MRN: [id number] Date of Birth: [birth year]  Today's Date: 12/21/2017 OT Individual Time: 1115-1200 OT Individual Time Calculation (min): 45 min    Short Term Goals: Week 1:  OT Short Term Goal 1 (Week 1): Pt will be able to stand using a bariatric stedy lift with max A of 1. OT Short Term Goal 2 (Week 1): Pt will be able to tolerate standing in a stedy fully upright for at least 2 min. OT Short Term Goal 3 (Week 1): pt will have improved R shoulder AROM to reach to wash under his L arm. OT Short Term Goal 4 (Week 1): Pt will have improved L shoulder AROM to be able to use L hand to pull shirt over R arm. OT Short Term Goal 5 (Week 1): Pt will be able to use R hand to use utensils with set up only.  Skilled Therapeutic Interventions/Progress Updates:    Pt sitting EOB upon arrival.  Pt completed bathing/dressing during earlier session.  Focus on discharge planning and discussion of LTG during Rehab stay.  Pt will require bariatric/heavy duty tub transfer bench.  Attempted to stand with Stedy from EOB X 3 without success.  Discussion with pt revealed that Sunday therapist had used Sarah for support but not lifting.  Pt engaged in lateral scooting at EOB. Focus on bed mobility. Pt with limited B shoulder PROM/AROM. Pt remained seated EOB with bed alarm activated.   Therapy Documentation Precautions:  Precautions Precautions: Cervical Required Braces or Orthoses: Cervical Brace Cervical Brace: Hard collar Restrictions Weight Bearing Restrictions: No Pain:  Pt denies pain  See Function Navigator for Current Functional Status.   Therapy/Group: Individual Therapy  Rich Brave 12/21/2017, 2:48 PM

## 2017-12-21 NOTE — Care Management Note (Signed)
[facility] Individual Statement of Services  Patient Name:  Franklin Hicks  Date:  12/21/2017  Welcome to the [facility].  Our goal is to provide you with an individualized program based on your diagnosis and situation, designed to meet your specific needs.  With this comprehensive rehabilitation program, you will be expected to participate in at least 3 hours of rehabilitation therapies Monday-Friday, with modified therapy programming on the weekends.  Your rehabilitation program will include the following services:  Physical Therapy (PT), Occupational Therapy (OT), 24 hour per day rehabilitation nursing, Therapeutic Recreaction (TR), Neuropsychology, Case Management (Social Worker), Rehabilitation Medicine, Nutrition Services and Pharmacy Services  Weekly team conferences will be held on Tuesdays to discuss your progress.  Your Social Worker will talk with you frequently to get your input and to update you on team discussions.  Team conferences with you and your family in attendance may also be held.  Expected length of stay: 4 weeks    Overall anticipated outcome:minimal / mod assistance  Depending on your progress and recovery, your program may change. Your Social Worker will coordinate services and will keep you informed of any changes. Your Social Worker's name and contact numbers are listed  below.  The following services may also be recommended but are not provided by the [facility]:   Driving Evaluations  Home Health Rehabiltiation Services  Outpatient Rehabilitation Services    Arrangements will be made to provide these services after discharge if needed.  Arrangements include referral to agencies that provide these services.  Your insurance has been verified to be:  [facility] Medicare Your primary doctor is:  Tana Conch  Pertinent information will be shared with your doctor and your insurance company.  Social  Worker:  [place], [place] [phone] or ([phone]   Information discussed with and copy given to patient by: Amada Jupiter, 12/21/2017, 3:29 PM

## 2017-12-21 NOTE — Progress Notes (Signed)
Physical Therapy Session Note  Patient Details  Name: Franklin Hicks MRN: [id number] Date of Birth: 01-Sep-1950  Today's Date: 12/21/2017 PT Individual Time: 1700-1730 PT Individual Time Calculation (min): 30 min   Short Term Goals: Week 1:  PT Short Term Goal 1 (Week 1): Pt will roll L to full side lying with use of bed features and +1 assist for pressure relief and to reduce burden of care for hygiene/dressing PT Short Term Goal 2 (Week 1): Pt will verbalize 1 method of pressure relief in w/c with min verbal cues PT Short Term Goal 3 (Week 1): Pt will demonstate lateral leans to elbow (R and L) and return to midline from EOB or therapy mat with min assist PT Short Term Goal 4 (Week 1): Pt will tolerate out of bed x30 minutes outside of therapy sessions  Skilled Therapeutic Interventions/Progress Updates:    no c/o pain at rest.  Session focus on planning for improving ability/safety for trialing sit<>stand and weight bearing, BLE AROM/stretching, and bed mobility.  Discussed trial with tilt table, and with use of different style bed to improve EOB comfort and likelihood of being able to transfer from EOB.  BLE AROM therex for strengthening 3x10 reps heel slides and hip abd/add to midline, pt with decreased range in L knee but improved with reps.  Also discussed positioning when in bed to include LEs flat and bed in slight trendellenberg position to prevent sliding to foot of bed if HOB raised.   Pt transitioned to sitting EOB at end of session with supervision.    Therapy Documentation Precautions:  Precautions Precautions: Cervical Required Braces or Orthoses: Cervical Brace Cervical Brace: Hard collar Restrictions Weight Bearing Restrictions: No   See Function Navigator for Current Functional Status.   Therapy/Group: Individual Therapy  Stephania Fragmin 12/21/2017, 7:34 PM

## 2017-12-21 NOTE — Progress Notes (Addendum)
[place] PHYSICAL MEDICINE & REHABILITATION     PROGRESS NOTE    Subjective/Complaints: No new issues overnight  ROS: Patient denies fever, rash, sore throat, blurred vision, nausea, vomiting, diarrhea, cough, shortness of breath or chest pain, joint or back pain, headache, or mood change.     Objective: Vital Signs: Blood pressure (!) 146/63, pulse 74, temperature 98.5 F (36.9 C), temperature source Oral, resp. rate 16, height 6' (1.829 m), weight (!) 170.1 kg (375 lb), SpO2 95 %. No results found. Recent Labs    12/21/17 0522  WBC 10.2  HGB 13.9  HCT 41.5  PLT 216   Recent Labs    12/21/17 0522  NA 135  K 4.2  CL 103  GLUCOSE 122*  BUN 18  CREATININE 1.05  CALCIUM 8.6*   CBG (last 3)  Recent Labs    12/20/17 1623 12/20/17 2112 12/21/17 0625  GLUCAP 95 156* 130*    Wt Readings from Last 3 Encounters:  12/21/17 (!) 170.1 kg (375 lb)  12/15/17 (!) 173.1 kg (381 lb 9.9 oz)  09/24/17 (!) 160 kg (352 lb 12.8 oz)    Physical Exam:  Constitutional: No distress . Vital signs reviewed. obese HEENT: EOMI, oral membranes moist Neck: supple Cardiovascular: RRR without murmur. No JVD    Respiratory: CTA Bilaterally without wheezes or rales. Normal effort    GI: BS +, non-tender, non-distended   Musculoskeletal: He exhibits edema.  Min edema BLE.  Neurological: He is alert and oriented to person, place, and time. No cranial nerve deficit.  Alert and appropriate. LUE 2 to 2+/5 prox to distal. RUE 3 to 3+/5. LLE 2/5 with inhibition due to knee injury. RLE 3+/5. Senses pain and gross touch in all 4's. --stable motor and sensory exam. Skin: Skin is warm and dry. He is not diaphoretic.  Psychiatric: Pleasant and appropriate.     Assessment/Plan: 1. Functional deficits and tetraplegia secondary to incomplete C4 spinal cord injury which require 3+ hours per day of interdisciplinary therapy in a comprehensive inpatient rehab setting. Physiatrist is providing close  team supervision and 24 hour management of active medical problems listed below. Physiatrist and rehab team continue to assess barriers to discharge/monitor patient progress toward functional and medical goals.  Function:  Bathing Bathing position   Position: Bed  Bathing parts Body parts bathed by patient: Chest, Abdomen Body parts bathed by helper: Right arm, Left arm, Front perineal area, Buttocks, Right upper leg, Left upper leg, Right lower leg, Left lower leg, Back  Bathing assist        Upper Body Dressing/Undressing Upper body dressing   What is the patient wearing?: Pull over shirt/dress       Pull over shirt/dress - Perfomed by helper: Thread/unthread right sleeve, Thread/unthread left sleeve, Put head through opening, Pull shirt over trunk        Upper body assist        Lower Body Dressing/Undressing Lower body dressing   What is the patient wearing?: Pants, Non-skid slipper socks       Pants- Performed by helper: Thread/unthread right pants leg, Thread/unthread left pants leg, Pull pants up/down   Non-skid slipper socks- Performed by helper: Don/doff right sock, Don/doff left sock                  Lower body assist        Toileting Toileting          Toileting assist     Transfers Chair/bed transfer  Chair/bed transfer activity did not occur: Safety/medical concerns           Locomotion Ambulation Ambulation activity did not occur: Safety/medical concerns         Wheelchair Wheelchair activity did not occur: Safety/medical concerns        Cognition Comprehension Comprehension assist level: Follows complex conversation/direction with no assist  Expression Expression assist level: Expresses complex ideas: With no assist  Social Interaction Social Interaction assist level: Interacts appropriately with others - No medications needed.  Problem Solving Problem solving assist level: Solves complex problems: Recognizes & self-corrects   Memory Memory assist level: Complete Independence: No helper  Medical Problem List and Plan:  1. Functional deficits secondary to C4 Spinal cord injury, motor and sensory incomplete  -Continue therapies PT and OT 2. DVT/PE/Anticoagulation: Patient now on Xarelto.  Appreciate pharmacy assistance 3. Pain Management: managed with prn medications.    -  gabapentin 100 mg 3 times daily for neuropathic pain 4. Mood: LCSW to follow for evaluation and support.  5. Neuropsych: This patient is capable of making decisions on his own behalf.  6. Skin/Wound Care: Routine pressure relief measures--order air mattress due to limited mobility and body habitus. Maintain adequate nutritional and hydration status.  7. Fluids/Electrolytes/Nutrition: encourage PO     I personally reviewed all of the patient's labs today, and lab work is within normal limits. 8. T2DM: Monitor BS ac/hs and use SSI for elevated BS. Continue to titrate medications for tighter control.   -Good control ongoing 4/29 9. CAD: Managed with Lipitor, Plavix, and metoprolol  10. Morbid obesity: Body mass index is 51.76 kg/m.  Diet and exercise education ongoing   11. Combined systolic/diastolic CHF: Monitor weights daily. Continue Lasix, Plavix, statin and metoprolol.    -Weights remain controlled 4/29 Filed Weights   12/19/17 0521 12/20/17 0447 12/21/17 0524  Weight: (!) 170.2 kg (375 lb 3.2 oz) (!) 170.2 kg (375 lb 3.6 oz) (!) 170.1 kg (375 lb)   12. HTN: Monitor BP bid. Continue terazosin and furosemide.  13. H/o BPH: Reports voiding without difficulty--on Hytrin.   14. Bowel and bladder: appears to be continent    -working on OOB to void.  His size is an impediment 15. Seasonal allergies?   LOS (Days) 4 A FACE TO FACE EVALUATION WAS PERFORMED  Ranelle Oyster, MD 12/21/2017 9:02 AM

## 2017-12-22 ENCOUNTER — Inpatient Hospital Stay (HOSPITAL_COMMUNITY): Payer: Medicare Other

## 2017-12-22 ENCOUNTER — Inpatient Hospital Stay (HOSPITAL_COMMUNITY): Payer: Medicare Other | Admitting: Physical Therapy

## 2017-12-22 LAB — GLUCOSE, CAPILLARY
GLUCOSE-CAPILLARY: 112 mg/dL — AB (ref 65–99)
GLUCOSE-CAPILLARY: 151 mg/dL — AB (ref 65–99)
Glucose-Capillary: 123 mg/dL — ABNORMAL HIGH (ref 65–99)
Glucose-Capillary: 145 mg/dL — ABNORMAL HIGH (ref 65–99)

## 2017-12-22 NOTE — Progress Notes (Signed)
[place] PHYSICAL MEDICINE & REHABILITATION     PROGRESS NOTE    Subjective/Complaints: Upset about some issues surrounding nursing. Condom catheter came off and he had urine leakage. Pain around a 5/5. Would like to be able to sit EOB on his own.  ROS: Patient denies fever, rash, sore throat, blurred vision, nausea, vomiting, diarrhea, cough, shortness of breath or chest pain, joint or back pain, headache, or mood change.    Objective: Vital Signs: Blood pressure (!) 113/59, pulse 74, temperature 98.8 F (37.1 C), temperature source Oral, resp. rate 18, height 6' (1.829 m), weight (!) 170.3 kg (375 lb 7.1 oz), SpO2 96 %. No results found. Recent Labs    12/21/17 0522  WBC 10.2  HGB 13.9  HCT 41.5  PLT 216   Recent Labs    12/21/17 0522  NA 135  K 4.2  CL 103  GLUCOSE 122*  BUN 18  CREATININE 1.05  CALCIUM 8.6*   CBG (last 3)  Recent Labs    12/21/17 1729 12/21/17 2148 12/22/17 0641  GLUCAP 102* 108* 112*    Wt Readings from Last 3 Encounters:  12/22/17 (!) 170.3 kg (375 lb 7.1 oz)  12/15/17 (!) 173.1 kg (381 lb 9.9 oz)  09/24/17 (!) 160 kg (352 lb 12.8 oz)    Physical Exam:  Constitutional: No distress . Vital signs reviewed. obese HEENT: EOMI, oral membranes moist Neck: supple Cardiovascular: RRR without murmur. No JVD    Respiratory: CTA Bilaterally without wheezes or rales. Normal effort    GI: BS +, non-tender, non-distended  Musculoskeletal: He exhibits edema.  Min edema BLE.  Neurological: He is alert and oriented to person, place, and time. No cranial nerve deficit.  Alert and appropriate. LUE 3- to 3/5 prox to distal. RUE 3 to 3+/5. LLE 2+/5 with inhibition due to knee injury. RLE 3+/5. Senses pain and gross touch in all 4's. --stable motor and sensory exam. Skin: Skin is warm and dry. He is not diaphoretic.  Psychiatric: Pleasant and appropriate.     Assessment/Plan: 1. Functional deficits and tetraplegia secondary to incomplete C4  spinal cord injury which require 3+ hours per day of interdisciplinary therapy in a comprehensive inpatient rehab setting. Physiatrist is providing close team supervision and 24 hour management of active medical problems listed below. Physiatrist and rehab team continue to assess barriers to discharge/monitor patient progress toward functional and medical goals.  Function:  Bathing Bathing position Bathing activity did not occur: (no completed during this session.  patient suggested to be night bath.  ) Position: Bed  Bathing parts Body parts bathed by patient: Chest, Abdomen Body parts bathed by helper: Right arm, Left arm, Front perineal area, Buttocks, Right upper leg, Left upper leg, Right lower leg, Left lower leg, Back  Bathing assist        Upper Body Dressing/Undressing Upper body dressing Upper body dressing/undressing activity did not occur: (preferred to work on UE strength & use today rather than bathe and dress again.   Stated he did so modifiied bathing on saturday or sunday) What is the patient wearing?: Pull over shirt/dress       Pull over shirt/dress - Perfomed by helper: Thread/unthread right sleeve, Thread/unthread left sleeve, Put head through opening, Pull shirt over trunk        Upper body assist        Lower Body Dressing/Undressing Lower body dressing   What is the patient wearing?: Pants, Non-skid slipper socks  Pants- Performed by helper: Thread/unthread right pants leg, Thread/unthread left pants leg, Pull pants up/down   Non-skid slipper socks- Performed by helper: Don/doff right sock, Don/doff left sock                  Lower body assist Assist for lower body dressing: (total assist)      Toileting Toileting     Toileting steps completed by helper: Adjust clothing prior to toileting, Performs perineal hygiene, Adjust clothing after toileting    Toileting assist Assist level: Two helpers(per report)   Transfers Chair/bed  transfer Chair/bed transfer activity did not occur: Safety/medical concerns           Locomotion Ambulation Ambulation activity did not occur: Safety/medical concerns         Wheelchair Wheelchair activity did not occur: Safety/medical concerns        Cognition Comprehension Comprehension assist level: Understands complex 90% of the time/cues 10% of the time  Expression Expression assist level: Expresses basic needs/ideas: With no assist  Social Interaction Social Interaction assist level: Interacts appropriately 90% of the time - Needs monitoring or encouragement for participation or interaction.  Problem Solving Problem solving assist level: Solves basic problems with no assist  Memory Memory assist level: More than reasonable amount of time  Medical Problem List and Plan:  1. Functional deficits secondary to C4 Spinal cord injury, motor and sensory incomplete  -Continue therapies PT and OT -team conference today. Team to review care plan again 2. DVT/PE/Anticoagulation: Patient now on Xarelto.  Reviewed long term need for xarelto with patient 3. Pain Management: managed with prn medications.    -  gabapentin 100 mg 3 times daily for neuropathic pain 4. Mood: LCSW to follow for evaluation and support.  5. Neuropsych: This patient is capable of making decisions on his own behalf.  6. Skin/Wound Care: Routine pressure relief measures--order air mattress due to limited mobility and body habitus. Maintain adequate nutritional and hydration status.  7. Fluids/Electrolytes/Nutrition: encourage PO 8. T2DM: Monitor BS ac/hs and use SSI for elevated BS. Continue to titrate medications for tighter control.   -Good control ongoing 4/30 9. CAD: Managed with Lipitor, Plavix, and metoprolol  10. Morbid obesity: Body mass index is 51.76 kg/m.  Diet and exercise education ongoing   11. Combined systolic/diastolic CHF: Monitor weights daily. Continue Lasix, Plavix, statin and metoprolol.     -Weights remain controlled 4/30 Filed Weights   12/20/17 0447 12/21/17 0524 12/22/17 0143  Weight: (!) 170.2 kg (375 lb 3.6 oz) (!) 170.1 kg (375 lb) (!) 170.3 kg (375 lb 7.1 oz)   12. HTN: Monitor BP bid. Continue terazosin and furosemide.  13. H/o BPH: Reports voiding without difficulty--on Hytrin.   14. Bowel and bladder: appears to be continent    -working on OOB to void.  His size is an impediment 15. Seasonal allergies?   LOS (Days) 5 A FACE TO FACE EVALUATION WAS PERFORMED  Ranelle Oyster, MD 12/22/2017 9:15 AM

## 2017-12-22 NOTE — Progress Notes (Signed)
Physical Therapy Session Note  Patient Details  Name: Franklin Hicks MRN: [id number] Date of Birth: [birth year]  Today's Date: 12/22/2017 PT Individual Time: 2025-4270 PT Individual Time Calculation (min): 56 min   Short Term Goals: Week 1:  PT Short Term Goal 1 (Week 1): Pt will roll L to full side lying with use of bed features and +1 assist for pressure relief and to reduce burden of care for hygiene/dressing PT Short Term Goal 2 (Week 1): Pt will verbalize 1 method of pressure relief in w/c with min verbal cues PT Short Term Goal 3 (Week 1): Pt will demonstate lateral leans to elbow (R and L) and return to midline from EOB or therapy mat with min assist PT Short Term Goal 4 (Week 1): Pt will tolerate out of bed x30 minutes outside of therapy sessions  Skilled Therapeutic Interventions/Progress Updates:   Pt in supine and agreeable to therapy, denies pain but c/o L knee stiffness w/ movement throughout session. Pt noted to have spilled urine on underwear, requesting to remove it and put on gown. Practiced rolling bilaterally multiple times w/ min assist for LLE management 2/2 stiffness. Total assist for LE garment management and pericare. Transferred to EOB w/ supervision and performed LE strengthening exercises as detailed below. Ended session sitting EOB, call bell within reach and all needs met. Pt Mod I to sit EOB as per safety plan. Deferred attempting OOB activity this session 2/2 safety w/ only 1 person to assist.   BLE strengthening exercises: -LAQs 2x10 -heel slides (L within available range) 2x10 -resisted abduction 2x10 -adduction squeezes 2x10 -ankle pumps 2x10 -passive hs and gastroc stretch w/ 20 sec hold at end range x3 each leg  Therapy Documentation Precautions:  Precautions Precautions: Cervical Required Braces or Orthoses: Cervical Brace Cervical Brace: Hard collar Restrictions Weight Bearing Restrictions: No Pain: Pain Assessment Pain Scale: 0-10 Pain Score:  0-No pain  See Function Navigator for Current Functional Status.   Therapy/Group: Individual Therapy  Simcha Speir K Arnette 12/22/2017, 1:57 PM

## 2017-12-22 NOTE — Progress Notes (Signed)
Occupational Therapy Session Note  Patient Details  Name: Franklin Hicks MRN: [id number] Date of Birth: [birth year]  Today's Date: 12/22/2017 OT Individual Time: 7829-5621 OT Individual Time Calculation (min): 62 min    Short Term Goals: Week 1:  OT Short Term Goal 1 (Week 1): Pt will be able to stand using a bariatric stedy lift with max A of 1. OT Short Term Goal 2 (Week 1): Pt will be able to tolerate standing in a stedy fully upright for at least 2 min. OT Short Term Goal 3 (Week 1): pt will have improved R shoulder AROM to reach to wash under his L arm. OT Short Term Goal 4 (Week 1): Pt will have improved L shoulder AROM to be able to use L hand to pull shirt over R arm. OT Short Term Goal 5 (Week 1): Pt will be able to use R hand to use utensils with set up only.  Skilled Therapeutic Interventions/Progress Updates:    Pt received supine in bed agreeable to therapy with c/o pain as described below. Pt completed transitional movement to EOB with (S) and set up for bed features. Vc and max A provided to scoot back on bed d/t proximity to edge. Once EOB pt was able to complete UB/LB bathing with set up/(S).CGA provided and vc for encouragement during forward flexion to reach distal LE. Pt's L LE was propped up to increase BOS during sitting and to compensate for lack of knee flexion. Pt returned to supine, with mod A provided to lift legs, and completed peri-cleansing with (S). Pt was set up to complete oral hygiene and required manual facilitation to position toothbrush without built up handle. Pt set up to eat breakfast EOB and educated re calling for A with any transitional movement.   Therapy Documentation Precautions:  Precautions Precautions: Cervical Required Braces or Orthoses: Cervical Brace Cervical Brace: Hard collar Restrictions Weight Bearing Restrictions: No  Pain: Pain Assessment Pain Scale: 0-10 Pain Score: 5  Pain Location: Arm Pain Orientation: Right;Left Pain  Descriptors / Indicators: Tingling Pain Onset: On-going ADL: ADL ADL Comments: refer to functional navigator  See Function Navigator for Current Functional Status.   Therapy/Group: Individual Therapy  Crissie Reese 12/22/2017, 8:56 AM

## 2017-12-22 NOTE — Progress Notes (Signed)
Occupational Therapy Session Note  Patient Details  Name: Franklin Hicks MRN: [id number] Date of Birth: 01-03-1951  Today's Date: 12/22/2017 OT Individual Time: 0930-1100 OT Individual Time Calculation (min): 90 min    Short Term Goals: Week 1:  OT Short Term Goal 1 (Week 1): Pt will be able to stand using a bariatric stedy lift with max A of 1. OT Short Term Goal 2 (Week 1): Pt will be able to tolerate standing in a stedy fully upright for at least 2 min. OT Short Term Goal 3 (Week 1): pt will have improved R shoulder AROM to reach to wash under his L arm. OT Short Term Goal 4 (Week 1): Pt will have improved L shoulder AROM to be able to use L hand to pull shirt over R arm. OT Short Term Goal 5 (Week 1): Pt will be able to use R hand to use utensils with set up only.  Skilled Therapeutic Interventions/Progress Updates:    Pt resting in bed upon arrival.  Initial focus on donning shorts and pullover shirt. Pt required tot A + 2 for donning shorts at bed level.  Pt required mod A for rolling in bed to facilitate pulling pants over hips.  Pt able to perform supine>sit EOB at supervision level and maintain sitting balance at supervision level while donning shirt.  Pt required assistance pulling shirt over head and trunk but is able to thread BUE into sleeves.  Remainder of session focused on setup with [employer] and sit<>stand X 1. Pt unable to to flex L knee sufficiently to provide assistance with sit<>stand and requires assistance with moving LLE to provide support when standing.  Pt remained sitting EOB.  Pt able to maintain sitting balance EOB at mod I level.  Pt cleared to sit EOB when alone in room.  Team members (therapy and RN) in agreement.   Therapy Documentation Precautions:  Precautions Precautions: Cervical Required Braces or Orthoses: Cervical Brace Cervical Brace: Hard collar Restrictions Weight Bearing Restrictions: No   Pain: Pain Assessment Pain Scale: 0-10 Pain Score: 2   Pain Location: Arm Pain Orientation: Right;Left Pain Descriptors / Indicators: Tingling Pain Onset: On-going  See Function Navigator for Current Functional Status.   Therapy/Group: Individual Therapy  Rich Brave 12/22/2017, 12:30 PM

## 2017-12-23 ENCOUNTER — Inpatient Hospital Stay (HOSPITAL_COMMUNITY): Payer: Medicare Other | Admitting: Physical Therapy

## 2017-12-23 ENCOUNTER — Inpatient Hospital Stay (HOSPITAL_COMMUNITY): Payer: Medicare Other | Admitting: *Deleted

## 2017-12-23 LAB — GLUCOSE, CAPILLARY
GLUCOSE-CAPILLARY: 151 mg/dL — AB (ref 65–99)
GLUCOSE-CAPILLARY: 107 mg/dL — AB (ref 65–99)
GLUCOSE-CAPILLARY: 125 mg/dL — AB (ref 65–99)
Glucose-Capillary: 117 mg/dL — ABNORMAL HIGH (ref 65–99)

## 2017-12-23 NOTE — Patient Care Conference (Signed)
Inpatient RehabilitationTeam Conference and Plan of Care Update Date: 12/22/2017   Time: 2:15 PM    Patient Name: Franklin Hicks      Medical Record Number: [id number]  Date of Birth: Apr 11, 1951 Sex: Male         Room/Bed: 4W16C/4W16C-01 Payor Info: Payor: [employer]vertising copy[employer] MEDICARE / Plan: [employer] MEDICARE / Product Type: *No Product type* /    Admitting Diagnosis: SCI  Admit Date/Time:  12/17/2017  5:28 PM Admission Comments: No comment available   Primary Diagnosis:  C4 spinal cord injury, sequela (HCC) Principal Problem: C4 spinal cord injury, sequela [facility])  Patient Active Problem List   Diagnosis Date Noted  . C4 spinal cord injury, sequela (HCC) 12/18/2017  . Tetraplegia (HCC) 12/18/2017  . Acute pulmonary embolism (HCC) 12/18/2017  . Aortic aneurysm (HCC) 12/16/2017  . Fall 12/13/2017  . Cord compression (HCC) 12/13/2017  . Abnormal CT scan, cervical spine 12/13/2017  . Chronic combined systolic and diastolic heart failure (HCC) 10/09/2016  . CAD S/P percutaneous coronary angioplasty 09/20/2016  . H/O non-ST elevation myocardial infarction (NSTEMI) 09/20/2016  . Erectile dysfunction 07/30/2015  . Morbid obesity with BMI of 50.0-59.9, adult (HCC) 07/18/2013  . History of pulmonary embolism 07/13/2013  . Angioedema of lips 07/13/2013  . Multinodular goiter 07/13/2013  . Dyslipidemia, goal LDL below 70 10/02/2011  . SOB (shortness of breath) 10/02/2011  . BPH with urinary obstruction 07/17/2010  . Diabetes mellitus type 2, controlled (HCC) 07/17/2010  . Bilateral lower extremity edema 12/08/2008  . NEUROPATHY, IDIOPATHIC PERIPHERAL NEC 05/31/2007  . Osteoarthritis 03/22/2007  . Gout 03/15/2007  . Essential hypertension 03/15/2007    Expected Discharge Date: Expected Discharge Date: 01/13/18  Team Members Present: Physician leading conference: Dr. Faith Rogue Social Worker Present: Amada Jupiter, LCSW Nurse Present: Tennis Must, RN PT Present: Karolee Stamps, PT OT Present:  Ardis Rowan, COTA;Jennifer Katrinka Blazing, OT PPS Coordinator present : Tora Duck, RN, CRRN     Current Status/Progress Goal Weekly Team Focus  Medical   Patient is here with a incomplete cervical cord injury at C4.  Left side involved more than the right.  Morbidly obese.  Some pain issues as well.  Maximize functional mobility  Pain control, blood pressure management,   Bowel/Bladder   continent bowel and bladder, LBM 4/29  remain continent bowel and bladder with mod assist  Assess bowel and bladder qshift   Swallow/Nutrition/ Hydration             ADL's   max/tot A bathing/dressing at bed level; bed mobility-rolling-min A, supine>sit EOB-supervision;   min A overall  sit<>stand, functional transfers, BUE AROM/strengthening, activity tolerance   Mobility   supervision to sit, +2 for everything else, haven't been able to stand 2/2 weight and no +2  lofty supervision for transfers and min for gait  standing, w/c positioning, education on pressure relief and stretching, general strengthening and activity tolerance   Communication             Safety/Cognition/ Behavioral Observations            Pain   patient denies pain  0-10, <4  assess pain q shift    Skin   no skin issues noted  skin to remain infection/ breakdown free  assess skin q shift and PRN    Rehab Goals Patient on target to meet rehab goals: Yes *See Care Plan and progress notes for long and short-term goals.     Barriers to Discharge  Current Status/Progress Possible  Resolutions Date Resolved   Physician    Medical stability;Other (comments)  Morbid obesity     Continue neuromuscular reeducation and strengthening of his 4 limbs.  Education      Nursing                  PT                    OT                  SLP                SW                Discharge Planning/Teaching Needs:  Home with wife and other family members providing 24/7 assistance.   Education to be completed closer to d/c.   Team Discussion:   Incomplete SCI;  Making progress;  Knee issues premorbid are affecting overall gains.  Mostly min assist goals with ADLs;  Hope to use tub bench at goal.  Left knee not dependable with sit- stands.  Currently mod - total assist with OT;  Hands improving.  +2 transfers with PT.  Goals set for supervision transfers and min assist short distance gait.  Discussion to change room and to request bari chair.  Revisions to Treatment Plan:  None    Continued Need for Acute Rehabilitation Level of Care: The patient requires daily medical management by a physician with specialized training in physical medicine and rehabilitation for the following conditions: Daily direction of a multidisciplinary physical rehabilitation program to ensure safe treatment while eliciting the highest outcome that is of practical value to the patient.: Yes Daily medical management of patient stability for increased activity during participation in an intensive rehabilitation regime.: Yes Daily analysis of laboratory values and/or radiology reports with any subsequent need for medication adjustment of medical intervention for : Neurological problems  Franklin Hicks 12/23/2017, 3:22 PM

## 2017-12-23 NOTE — Progress Notes (Signed)
Physical Therapy Session Note  Patient Details  Name: Franklin Hicks MRN: [id number] Date of Birth: 1951-06-28  Today's Date: 12/23/2017 PT Individual Time: 0900-0930 PT Individual Time Calculation (min): 30 min   Short Term Goals: Week 1:  PT Short Term Goal 1 (Week 1): Pt will roll L to full side lying with use of bed features and +1 assist for pressure relief and to reduce burden of care for hygiene/dressing PT Short Term Goal 2 (Week 1): Pt will verbalize 1 method of pressure relief in w/c with min verbal cues PT Short Term Goal 3 (Week 1): Pt will demonstate lateral leans to elbow (R and L) and return to midline from EOB or therapy mat with min assist PT Short Term Goal 4 (Week 1): Pt will tolerate out of bed x30 minutes outside of therapy sessions  Skilled Therapeutic Interventions/Progress Updates:    Pt supine in bed upon therapist arrival, agreeable to PT. Pt reports 6/10 pain in BUE and between shoulders, nerve pain. Rolling to the R/L with min A and use of bedrails, assist for LE. Sidelying to sitting EOB with Supervision and use of bedrails. Seated BLE therex x 10 reps: marches, LAQ, hip abd squeezes, ankle pumps. Lateral leans L/R onto pillow on bed with SBA to return to midline x 10 reps each direction. Pt left seated EOB set up for breakfast, soft touch call button in reach.  Therapy Documentation Precautions:  Precautions Precautions: Cervical Required Braces or Orthoses: Cervical Brace Cervical Brace: Hard collar Restrictions Weight Bearing Restrictions: No  See Function Navigator for Current Functional Status.   Therapy/Group: Individual Therapy  Peter Congo, PT, DPT  12/23/2017, 10:57 AM

## 2017-12-23 NOTE — Progress Notes (Signed)
Physical Therapy Session Note  Patient Details  Name: Franklin Hicks MRN: [id number] Date of Birth: Jan 17, 1951  Today's Date: 12/23/2017 PT Individual Time: 3748-2707 PT Individual Time Calculation (min): 75 min   Short Term Goals: Week 1:  PT Short Term Goal 1 (Week 1): Pt will roll L to full side lying with use of bed features and +1 assist for pressure relief and to reduce burden of care for hygiene/dressing PT Short Term Goal 2 (Week 1): Pt will verbalize 1 method of pressure relief in w/c with min verbal cues PT Short Term Goal 3 (Week 1): Pt will demonstate lateral leans to elbow (R and L) and return to midline from EOB or therapy mat with min assist PT Short Term Goal 4 (Week 1): Pt will tolerate out of bed x30 minutes outside of therapy sessions  Skilled Therapeutic Interventions/Progress Updates:    no c/o pain except "the usual" in his hands.  Session focus on problem solving for out of bed transfers and initial d/c planning.    Pt requires supervision to come to EOB with bed features and extra time.  From elevated EOB, attempted sit<.>stand x3 with focus on forward weight shift, keeping chest lifted, and bringing pelvis forward.  On 4th attempt, utilized sheet wrapped around pt's hips to facilitate forward movement with success.  Pt able to tolerate supported standing/sitting in stedy x5 minutes while bari chair set up.  Stedy to transfer to [facility] chair, again pt required use of sheet for forward translation of pelvis to remove seat from stedy and assist to slow descent.  Once in sitting, PT instructed pt in x10 reps with green theraband, bicep curls, and chest presses.  Pt engaged in pet therapy with focus on forward weight shift/reaching to pet dogs.  Pt positioned to comfort at end of session with call bell in reach and needs met.   Therapy Documentation Precautions:  Precautions Precautions: Cervical Required Braces or Orthoses: Cervical Brace Cervical Brace: Hard  collar Restrictions Weight Bearing Restrictions: No   See Function Navigator for Current Functional Status.   Therapy/Group: Individual Therapy  Michel Santee 12/23/2017, 2:22 PM

## 2017-12-23 NOTE — Progress Notes (Signed)
Occupational Therapy Session Note  Patient Details  Name: Franklin Hicks MRN: [id number] Date of Birth: [birth year]  Today's Date: 12/23/2017 OT Individual Time: 0930-1100 OT Individual Time Calculation (min): 90 min    Short Term Goals: Week 1:  OT Short Term Goal 1 (Week 1): Pt will be able to stand using a bariatric stedy lift with max A of 1. OT Short Term Goal 2 (Week 1): Pt will be able to tolerate standing in a stedy fully upright for at least 2 min. OT Short Term Goal 3 (Week 1): pt will have improved R shoulder AROM to reach to wash under his L arm. OT Short Term Goal 4 (Week 1): Pt will have improved L shoulder AROM to be able to use L hand to pull shirt over R arm. OT Short Term Goal 5 (Week 1): Pt will be able to use R hand to use utensils with set up only.  Skilled Therapeutic Interventions/Progress Updates:    OT intervention with initial focus on UB bathing tasks and dressing seated EOB(UB) and at bed level (LB). Pt sitting EOB upon arrival.  Pt able to thread BUE into sleeves but requierd assistance pulling over head and trunk.  Pt required assistance placing BLE onto bed when performing sit>supine.  Pt required assistance repositioning in bed and donning shorts.  Pt required min A for rolling in bed to facilitate pulling pants over hips.  Pt returned to sitting EOB at supervision level.  Kinesio tape applied to upper traps and along medial border of scapulae. PROM and soft tissue mobs provided for BUE shoulders.  Pt engaged in [facility] table tasks with foam cubes, theraputty and small beads, and tearing strips of paper to increase [facility] skills and independence with BADLs. Pt remained seated EOB with table placed in front.  All needs within reach and soft call bell attached to pts shirt.   Therapy Documentation Precautions:  Precautions Precautions: Cervical Required Braces or Orthoses: Cervical Brace Cervical Brace: Hard collar Restrictions Weight Bearing Restrictions: No Pain: Pain  Assessment Pain Scale: 0-10 Pain Score: 0-No pain  See Function Navigator for Current Functional Status.   Therapy/Group: Individual Therapy  Rich Brave 12/23/2017, 12:54 PM

## 2017-12-23 NOTE — Progress Notes (Signed)
Social Work Patient ID: Franklin Hicks, male   DOB: 1951/07/17, 67 y.o.   MRN: [id number]   Have reviewed team conference with pt who is aware and agreeable with targeted d/c date of 5/22, however, hopeful he will be going home earlier than expected by team.  He is in a new room and up in chair toady.  Making jokes and appears pleased with progress he is making.  Will continue to follow for support and d/c planning needs.  Novice Vrba, LCSW

## 2017-12-23 NOTE — Progress Notes (Signed)
[facility] PHYSICAL MEDICINE & REHABILITATION     PROGRESS NOTE    Subjective/Complaints: Had a reasonable night. No problems. Pain under control  ROS: Patient denies fever, rash, sore throat, blurred vision, nausea, vomiting, diarrhea, cough, shortness of breath or chest pain, joint or back pain, headache, or mood change.     Objective: Vital Signs: Blood pressure (!) 126/51, pulse 77, temperature 98.6 F (37 C), temperature source Oral, resp. rate 16, height 6' (1.829 m), weight (!) 170.4 kg (375 lb 10.6 oz), SpO2 97 %. No results found. Recent Labs    12/21/17 0522  WBC 10.2  HGB 13.9  HCT 41.5  PLT 216   Recent Labs    12/21/17 0522  NA 135  K 4.2  CL 103  GLUCOSE 122*  BUN 18  CREATININE 1.05  CALCIUM 8.6*   CBG (last 3)  Recent Labs    12/22/17 1636 12/22/17 2126 12/23/17 0627  GLUCAP 145* 151* 117*    Wt Readings from Last 3 Encounters:  12/23/17 (!) 170.4 kg (375 lb 10.6 oz)  12/15/17 (!) 173.1 kg (381 lb 9.9 oz)  09/24/17 (!) 160 kg (352 lb 12.8 oz)    Physical Exam:  Constitutional: No distress . Vital signs reviewed. HEENT: EOMI, oral membranes moist Neck: supple Cardiovascular: RRR without murmur. No JVD    Respiratory: CTA Bilaterally without wheezes or rales. Normal effort    GI: BS +, non-tender, non-distended  Musculoskeletal: He exhibits edema.  Min edema BLE.  Neurological: He is alert and oriented to person, place, and time. No cranial nerve deficit.  Alert and appropriate. LUE 3- to 3/5 prox to distal. RUE 3 to 3+/5. LLE 2+/5 with inhibition due to knee injury. RLE 3+/5. Senses pain and gross touch in all 4's. --motor and sensory exam stable Skin: Skin is warm and dry. He is not diaphoretic.  Psychiatric: Pleasant and appropriate.     Assessment/Plan: 1. Functional deficits and tetraplegia secondary to incomplete C4 spinal cord injury which require 3+ hours per day of interdisciplinary therapy in a comprehensive inpatient rehab  setting. Physiatrist is providing close team supervision and 24 hour management of active medical problems listed below. Physiatrist and rehab team continue to assess barriers to discharge/monitor patient progress toward functional and medical goals.  Function:  Bathing Bathing position Bathing activity did not occur: (no completed during this session.  patient suggested to be night bath.  ) Position: Sitting EOB  Bathing parts Body parts bathed by patient: Right arm, Left arm, Right upper leg, Chest, Abdomen, Front perineal area, Left upper leg, Right lower leg, Left lower leg Body parts bathed by helper: Back  Bathing assist Assist Level: Supervision or verbal cues      Upper Body Dressing/Undressing Upper body dressing Upper body dressing/undressing activity did not occur: (preferred to work on UE strength & use today rather than bathe and dress again.   Stated he did so modifiied bathing on saturday or sunday) What is the patient wearing?: Hospital gown     Pull over shirt/dress - Perfomed by patient: Thread/unthread right sleeve, Put head through opening, Thread/unthread left sleeve Pull over shirt/dress - Perfomed by helper: Thread/unthread right sleeve, Thread/unthread left sleeve, Put head through opening, Pull shirt over trunk        Upper body assist Assist Level: Supervision or verbal cues      Lower Body Dressing/Undressing Lower body dressing Lower body dressing/undressing activity did not occur: N/A What is the patient wearing?: Pants, Non-skid  slipper socks       Pants- Performed by helper: Thread/unthread right pants leg, Thread/unthread left pants leg, Pull pants up/down   Non-skid slipper socks- Performed by helper: Don/doff right sock, Don/doff left sock                  Lower body assist Assist for lower body dressing: (total assist)      Toileting Toileting Toileting activity did not occur: No continent bowel/bladder event   Toileting steps  completed by helper: Adjust clothing prior to toileting, Performs perineal hygiene, Adjust clothing after toileting    Toileting assist Assist level: Two helpers(per report)   Transfers Chair/bed transfer Chair/bed transfer activity did not occur: Safety/medical concerns           Locomotion Ambulation Ambulation activity did not occur: Safety/medical concerns         Wheelchair Wheelchair activity did not occur: Safety/medical concerns        Cognition Comprehension Comprehension assist level: Understands complex 90% of the time/cues 10% of the time  Expression Expression assist level: Expresses complex 90% of the time/cues < 10% of the time  Social Interaction Social Interaction assist level: Interacts appropriately 90% of the time - Needs monitoring or encouragement for participation or interaction.  Problem Solving Problem solving assist level: Solves complex 90% of the time/cues < 10% of the time  Memory Memory assist level: Recognizes or recalls 90% of the time/requires cueing < 10% of the time  Medical Problem List and Plan:  1. Functional deficits secondary to C4 Spinal cord injury, motor and sensory incomplete  -Continue therapies PT and OT -team conference today. Team to review care plan again 2. DVT/PE/Anticoagulation: Patient now on Xarelto.  Reviewed long term need for xarelto with patient 3. Pain Management: managed with prn medications.    -  gabapentin 100 mg 3 times daily for neuropathic pain 4. Mood: LCSW to follow for evaluation and support.  5. Neuropsych: This patient is capable of making decisions on his own behalf.  6. Skin/Wound Care: Routine pressure relief measures--order air mattress due to limited mobility and body habitus. Maintain adequate nutritional and hydration status.  7. Fluids/Electrolytes/Nutrition: encourage PO 8. T2DM: Monitor BS ac/hs and use SSI for elevated BS. Continue to titrate medications for tighter control.   -Good control  ongoing 5/1 9. CAD: Managed with Lipitor, Plavix, and metoprolol  10. Morbid obesity: Body mass index is 51.76 kg/m.  Diet and exercise education ongoing   11. Combined systolic/diastolic CHF: Monitor weights daily. Continue Lasix, Plavix, statin and metoprolol.    -Weights remain controlled 5/1 Filed Weights   12/21/17 0524 12/22/17 0143 12/23/17 0428  Weight: (!) 170.1 kg (375 lb) (!) 170.3 kg (375 lb 7.1 oz) (!) 170.4 kg (375 lb 10.6 oz)   12. HTN: Monitor BP bid. Continue terazosin and furosemide.  13. H/o BPH: Reports voiding without difficulty--on Hytrin.   14. Bowel and bladder: appears to be continent    -working on OOB to void.  His size is an impediment 15. Seasonal allergies?   LOS (Days) 6 A FACE TO FACE EVALUATION WAS PERFORMED  Ranelle Oyster, MD 12/23/2017 8:46 AM

## 2017-12-24 ENCOUNTER — Inpatient Hospital Stay (HOSPITAL_COMMUNITY): Payer: Medicare Other

## 2017-12-24 ENCOUNTER — Inpatient Hospital Stay (HOSPITAL_COMMUNITY): Payer: Medicare Other | Admitting: Occupational Therapy

## 2017-12-24 ENCOUNTER — Inpatient Hospital Stay (HOSPITAL_COMMUNITY): Payer: Medicare Other | Admitting: Physical Therapy

## 2017-12-24 LAB — GLUCOSE, CAPILLARY
GLUCOSE-CAPILLARY: 132 mg/dL — AB (ref 65–99)
GLUCOSE-CAPILLARY: 122 mg/dL — AB (ref 65–99)
Glucose-Capillary: 104 mg/dL — ABNORMAL HIGH (ref 65–99)

## 2017-12-24 NOTE — Progress Notes (Signed)
Occupational Therapy Weekly Progress Note  Patient Details  Name: Franklin Hicks MRN: [id number] Date of Birth: 02/23/1951  Beginning of progress report period: December 18, 2017 End of progress report period: Dec 24, 2017  Patient has met 2 of 5 short term goals.  Pt is making slow but steady progress with BADLs since admission.  Pt is able to perform supine>sit EOB at supervision level but requires assistance placing BLE onto bed with sit>supiine.  Pt is able to thread BUE into shirt sleeves but requires assistance with pulling over head and trunk.  Pt is tot A of LB dressing tasks at bed level.  Pt currently requires use of Stedy with +2 for sit<>stand from EOB.  Pt with limited B shoulder ROM (0-75 degrees). Pt with limited knee flexion at 45 degrees inhibiting sit<>stand currently.   Patient continues to demonstrate the following deficits: muscle weakness, muscle joint tightness and muscle paralysis, abnormal tone, unbalanced muscle activation and decreased coordination and decreased sitting balance, decreased standing balance and decreased postural control and therefore will continue to benefit from skilled OT intervention to enhance overall performance with BADL.  Patient progressing toward long term goals..  Continue plan of care.  OT Short Term Goals Week 1:  OT Short Term Goal 1 (Week 1): Pt will be able to stand using a bariatric stedy lift with max A of 1. OT Short Term Goal 1 - Progress (Week 1): Progressing toward goal OT Short Term Goal 2 (Week 1): Pt will be able to tolerate standing in a stedy fully upright for at least 2 min. OT Short Term Goal 2 - Progress (Week 1): Progressing toward goal OT Short Term Goal 3 (Week 1): pt will have improved R shoulder AROM to reach to wash under his L arm. OT Short Term Goal 3 - Progress (Week 1): Progressing toward goal OT Short Term Goal 4 (Week 1): Pt will have improved L shoulder AROM to be able to use L hand to pull shirt over R arm. OT Short  Term Goal 4 - Progress (Week 1): Met OT Short Term Goal 5 (Week 1): Pt will be able to use R hand to use utensils with set up only. OT Short Term Goal 5 - Progress (Week 1): Met Week 2:  OT Short Term Goal 1 (Week 2): Pt will be able to stand using a bariatric stedy lift with max A of 1. OT Short Term Goal 2 (Week 2): Pt will be able to tolerate standing in a stedy fully upright for at least 2 min. OT Short Term Goal 3 (Week 2): pt will have improved R shoulder AROM to reach to wash under his L arm. OT Short Term Goal 4 (Week 2): Pt will don pullover shirt with min A   Therapy Documentation Precautions:  Precautions Precautions: Cervical Required Braces or Orthoses: Cervical Brace Cervical Brace: Hard collar Restrictions Weight Bearing Restrictions: No    See Function Navigator for Current Functional Status.   Lanier, Thomas Chappell 12/24/2017, 6:53 AM   

## 2017-12-24 NOTE — Progress Notes (Signed)
Occupational Therapy Session Note  Patient Details  Name: Franklin Hicks MRN: [id number] Date of Birth: 08/01/1951  Today's Date: 12/24/2017 OT Individual Time: 4373-5789 OT Individual Time Calculation (min): 44 min    Short Term Goals: Week 2:  OT Short Term Goal 1 (Week 2): Pt will be able to stand using a bariatric stedy lift with max A of 1. OT Short Term Goal 2 (Week 2): Pt will be able to tolerate standing in a stedy fully upright for at least 2 min. OT Short Term Goal 3 (Week 2): pt will have improved R shoulder AROM to reach to wash under his L arm. OT Short Term Goal 4 (Week 2): Pt will don pullover shirt with min A  Skilled Therapeutic Interventions/Progress Updates:    Pt received sitting up in bari chair agreeable to therapy with no c/o pain. Pt had taxing earlier tx session and would be attempting to stand in afternoon session, so tx completed from sitting to conserve energy. Session focused on preparatory methods- increasing core strength and B hip flexion/knee extension to increase strength required to complete ADL transfers.  Pt completed 3 sets of: 15 seated crunches with 1lb weighted ball (manual facilitation required to grasp ball bimanually), 15 ea hip flexion, 15 ea knee extension, and 15 chest press with a 3 lb dowel. Demo and vc provided throughout session to correct form and encourage pt. Pt left sitting up in bari chair with all needs met.    Therapy Documentation Precautions:  Precautions Precautions: Cervical Required Braces or Orthoses: Cervical Brace Cervical Brace: Hard collar Restrictions Weight Bearing Restrictions: No   Pain: Pain Assessment Pain Scale: 0-10 Pain Score: 0-No pain ADL: ADL ADL Comments: refer to functional navigator  See Function Navigator for Current Functional Status.   Therapy/Group: Individual Therapy  Curtis Sites 12/24/2017, 12:17 PM

## 2017-12-24 NOTE — Progress Notes (Signed)
Physical Therapy Session Note  Patient Details  Name: Franklin Hicks MRN: [id number] Date of Birth: 1950/10/04  Today's Date: 12/24/2017 PT Individual Time: 7517-0017 PT Individual Time Calculation (min): 30 min   Short Term Goals: Week 1:  PT Short Term Goal 1 (Week 1): Pt will roll L to full side lying with use of bed features and +1 assist for pressure relief and to reduce burden of care for hygiene/dressing PT Short Term Goal 2 (Week 1): Pt will verbalize 1 method of pressure relief in w/c with min verbal cues PT Short Term Goal 3 (Week 1): Pt will demonstate lateral leans to elbow (R and L) and return to midline from EOB or therapy mat with min assist PT Short Term Goal 4 (Week 1): Pt will tolerate out of bed x30 minutes outside of therapy sessions  Skilled Therapeutic Interventions/Progress Updates:   Pt in supine and agreeable to therapy, denies pain. Requesting to use urinal. Transferred to EOB for increased ease w/ urinal use. Transferred w/ supervision and provided set-up assist to void. Continent void w/ increased time. Performed BLE strengthening at EOB including LAQs, ankle pumps, knee marches, and passive/static hs and gastroc stretching. Max assist to scoot hips/LEs back on bed for support. Ended session sitting EOB, modified independent as per pt safety plan. Call bell within reach and all needs met.   Therapy Documentation Precautions:  Precautions Precautions: Cervical Required Braces or Orthoses: Cervical Brace Cervical Brace: Hard collar Restrictions Weight Bearing Restrictions: No Vital Signs: Therapy Vitals Temp: 98 F (36.7 C) Temp Source: Oral Pulse Rate: 84 Resp: 18 BP: (!) 144/75 Patient Position (if appropriate): Sitting Oxygen Therapy SpO2: 99 % O2 Device: Room Air Pain: Pain Assessment Pain Scale: 0-10 Pain Score: 0-No pain See Function Navigator for Current Functional Status.   Therapy/Group: Individual Therapy  Daymein Nunnery K Arnette 12/24/2017, 4:59 PM

## 2017-12-24 NOTE — Progress Notes (Signed)
[facility] PHYSICAL MEDICINE & REHABILITATION     PROGRESS NOTE    Subjective/Complaints: No new issues. Had a good night. Got up to chair yesterday. Sat up for 4 hours  ROS: Patient denies fever, rash, sore throat, blurred vision, nausea, vomiting, diarrhea, cough, shortness of breath or chest pain, joint or back pain, headache, or mood change.      Objective: Vital Signs: Blood pressure 129/65, pulse 90, temperature 98.5 F (36.9 C), temperature source Oral, resp. rate 16, height 6' (1.829 m), weight (!) 170.3 kg (375 lb 7.1 oz), SpO2 98 %. No results found. No results for input(s): WBC, HGB, HCT, PLT in the last 72 hours. No results for input(s): NA, K, CL, GLUCOSE, BUN, CREATININE, CALCIUM in the last 72 hours.  Invalid input(s): CO CBG (last 3)  Recent Labs    12/23/17 1636 12/23/17 2128 12/24/17 0636  GLUCAP 107* 125* 132*    Wt Readings from Last 3 Encounters:  12/24/17 (!) 170.3 kg (375 lb 7.1 oz)  12/15/17 (!) 173.1 kg (381 lb 9.9 oz)  09/24/17 (!) 160 kg (352 lb 12.8 oz)    Physical Exam:  Constitutional: No distress . Vital signs reviewed. HEENT: EOMI, oral membranes moist Neck: supple Cardiovascular: RRR without murmur. No JVD    Respiratory: CTA Bilaterally without wheezes or rales. Normal effort    GI: BS +, non-tender, non-distended  Musculoskeletal: He exhibits edema tr to 1+ LE  Min edema BLE.  Neurological: He is alert and oriented to person, place, and time. No cranial nerve deficit.  Alert and appropriate. LUE 3- to 3/5 prox to distal. RUE 3 to 3+/5. LLE 2+/5 with inhibition due to knee injury. RLE 3+/5. Senses pain and gross touch in all 4's. -no change Skin: Skin is warm and dry. He is not diaphoretic.  Psychiatric: Pleasant and appropriate.     Assessment/Plan: 1. Functional deficits and tetraplegia secondary to incomplete C4 spinal cord injury which require 3+ hours per day of interdisciplinary therapy in a comprehensive inpatient rehab  setting. Physiatrist is providing close team supervision and 24 hour management of active medical problems listed below. Physiatrist and rehab team continue to assess barriers to discharge/monitor patient progress toward functional and medical goals.  Function:  Bathing Bathing position Bathing activity did not occur: N/A(night bath) Position: Bed  Bathing parts Body parts bathed by patient: Right arm, Left arm, Right upper leg, Chest, Abdomen, Front perineal area, Left upper leg, Right lower leg, Left lower leg Body parts bathed by helper: Back  Bathing assist Assist Level: 2 helpers      Upper Body Dressing/Undressing Upper body dressing Upper body dressing/undressing activity did not occur: (preferred to work on UE strength & use today rather than bathe and dress again.   Stated he did so modifiied bathing on saturday or sunday) What is the patient wearing?: Pull over shirt/dress     Pull over shirt/dress - Perfomed by patient: Thread/unthread right sleeve, Thread/unthread left sleeve Pull over shirt/dress - Perfomed by helper: Put head through opening, Pull shirt over trunk        Upper body assist Assist Level: Supervision or verbal cues      Lower Body Dressing/Undressing Lower body dressing Lower body dressing/undressing activity did not occur: N/A What is the patient wearing?: Pants, Non-skid slipper socks       Pants- Performed by helper: Thread/unthread right pants leg, Thread/unthread left pants leg, Pull pants up/down   Non-skid slipper socks- Performed by helper: Don/doff right  sock, Don/doff left sock                  Lower body assist Assist for lower body dressing: (total assist)      Toileting Toileting Toileting activity did not occur: No continent bowel/bladder event   Toileting steps completed by helper: Adjust clothing prior to toileting, Performs perineal hygiene, Adjust clothing after toileting    Toileting assist Assist level: Two  helpers(per report)   Transfers Chair/bed transfer Chair/bed transfer activity did not occur: Safety/medical concerns   Chair/bed transfer assist level: 2 helpers Chair/bed transfer assistive device: Mechanical lift Mechanical lift: Landscape architect Ambulation activity did not occur: Safety/medical Investment banker, operational activity did not occur: Safety/medical concerns        Cognition Comprehension Comprehension assist level: Understands complex 90% of the time/cues 10% of the time  Expression Expression assist level: Expresses basic 90% of the time/requires cueing < 10% of the time.  Social Interaction Social Interaction assist level: Interacts appropriately 90% of the time - Needs monitoring or encouragement for participation or interaction.  Problem Solving Problem solving assist level: Solves basic 90% of the time/requires cueing < 10% of the time  Memory Memory assist level: Recognizes or recalls 90% of the time/requires cueing < 10% of the time  Medical Problem List and Plan:  1. Functional deficits secondary to C4 Spinal cord injury, motor and sensory incomplete  -Continue therapies PT and OT 2. DVT/PE/Anticoagulation: Patient now on Xarelto.  Reviewed long term need for xarelto with patient 3. Pain Management: managed with prn medications.    -  gabapentin 100 mg 3 times daily for neuropathic pain with improved control 4. Mood: LCSW to follow for evaluation and support.  5. Neuropsych: This patient is capable of making decisions on his own behalf.  6. Skin/Wound Care: continue pressure relief measures, appropriate nutrition 7. Fluids/Electrolytes/Nutrition: encourage PO 8. T2DM: Monitor BS ac/hs and use SSI for elevated BS. Continue to titrate medications for tighter control.   -Good control ongoing 5/1 9. CAD: Managed with Lipitor, Plavix, and metoprolol  10. Morbid obesity: Body mass index is 51.76 kg/m.  Diet and exercise education  ongoing   11. Combined systolic/diastolic CHF: Monitor weights daily. Continue Lasix, Plavix, statin and metoprolol.    -Weights remain controlled 5/2 Filed Weights   12/22/17 0143 12/23/17 0428 12/24/17 0156  Weight: (!) 170.3 kg (375 lb 7.1 oz) (!) 170.4 kg (375 lb 10.6 oz) (!) 170.3 kg (375 lb 7.1 oz)   12. HTN: Monitor BP bid. Continue terazosin and furosemide.  13. H/o BPH: Reports voiding without difficulty--on Hytrin.   14. Bowel and bladder: appears to be continent    -working on OOB to void.  His size is an impediment 15. Seasonal allergies?   LOS (Days) 7 A FACE TO FACE EVALUATION WAS PERFORMED  Ranelle Oyster, MD 12/24/2017 9:14 AM

## 2017-12-24 NOTE — Progress Notes (Signed)
Physical Therapy Session Note  Patient Details  Name: Franklin Hicks MRN: [id number] Date of Birth: Oct 03, 1950  Today's Date: 12/24/2017 PT Individual Time: 1300-1400 PT Individual Time Calculation (min): 60 min   Short Term Goals: Week 1:  PT Short Term Goal 1 (Week 1): Pt will roll L to full side lying with use of bed features and +1 assist for pressure relief and to reduce burden of care for hygiene/dressing PT Short Term Goal 2 (Week 1): Pt will verbalize 1 method of pressure relief in w/c with min verbal cues PT Short Term Goal 3 (Week 1): Pt will demonstate lateral leans to elbow (R and L) and return to midline from EOB or therapy mat with min assist PT Short Term Goal 4 (Week 1): Pt will tolerate out of bed x30 minutes outside of therapy sessions  Skilled Therapeutic Interventions/Progress Updates:    no c/o pain.  Session focus on sit<>stand transfers from elevated surfaces, supported standing in stedy, standing tolerance in stedy, and sitting balance while in stedy without UE support.    Pt able to come to standing from elevated bari-chair and mat with +2 assist using sheet around hips to help facilitate forward weight shift.  Once in stedy, pt able to tolerate supported standing (sitting on stedy seat) throughout entire session except ~8 minute rest on mat.  From elevated stedy pt continues to require +2 to assist with bringing hips forward, but with pt performing more of the work (mod +2 from therapists).  While standing in stedy focus on trunk extension strengthening with repetitions coming from forward flexed posture to upright posture with UE support and up to mod assist from therapist.  Pt able to complete 2 rounds of this task in standing until fatigue and RLE buckled resulting in pt returning to sitting on elevated stedy seat.  Pt engaged in ball tap x2 minutes progressing from single UE support to no UE support on stedy while remaining only slightly outside BOS for taps.  Pt returned  to room at end of session and requesting to sit EOB.  Positioned to comfort with call bell in reach and needs met.   Therapy Documentation Precautions:  Precautions Precautions: Cervical Required Braces or Orthoses: Cervical Brace Cervical Brace: Hard collar Restrictions Weight Bearing Restrictions: No   See Function Navigator for Current Functional Status.   Therapy/Group: Individual Therapy  Michel Santee 12/24/2017, 3:21 PM

## 2017-12-24 NOTE — Progress Notes (Signed)
Occupational Therapy Session Note  Patient Details  Name: Franklin Hicks MRN: [id number] Date of Birth: 1951-06-04  Today's Date: 12/24/2017 OT Individual Time: 0900-1000 OT Individual Time Calculation (min): 60 min    Short Term Goals: Week 1:  OT Short Term Goal 1 (Week 1): Pt will be able to stand using a bariatric stedy lift with max A of 1. OT Short Term Goal 1 - Progress (Week 1): Progressing toward goal OT Short Term Goal 2 (Week 1): Pt will be able to tolerate standing in a stedy fully upright for at least 2 min. OT Short Term Goal 2 - Progress (Week 1): Progressing toward goal OT Short Term Goal 3 (Week 1): pt will have improved R shoulder AROM to reach to wash under his L arm. OT Short Term Goal 3 - Progress (Week 1): Progressing toward goal OT Short Term Goal 4 (Week 1): Pt will have improved L shoulder AROM to be able to use L hand to pull shirt over R arm. OT Short Term Goal 4 - Progress (Week 1): Met OT Short Term Goal 5 (Week 1): Pt will be able to use R hand to use utensils with set up only. OT Short Term Goal 5 - Progress (Week 1): Met  Skilled Therapeutic Interventions/Progress Updates:    OT intervention with initial focus on UB bathing/dressing seated EOB and LB dressing at bed level.  Pt able to thread BUE into sleeves but requires assistance with pulling over head and trunk.  Pt applied deoderant with support provided at elbows.  Pt requires tot A for LB dressing at bed level.  Pt able to perform supine>sit EOB at supervision level.  Focus transitioned to sit<>stand with bariatric Stedy and attempt at standing with maxisky in gym.  Pt requires tot A+2 for sit<>stand in [place] with sheet under buttocks to provide adequate leverage to assist with standing.  Pt unable to extend trunk when standing in [place]. Pt returned to room and transferred to bariatric chair with BLE supports.  Pt remained in chair with all needs within reach.   Therapy Documentation Precautions:   Precautions Precautions: Cervical Required Braces or Orthoses: Cervical Brace Cervical Brace: Hard collar Restrictions Weight Bearing Restrictions: No Pain: Pain Assessment Pain Scale: 0-10 Pain Score: 0-No pain  See Function Navigator for Current Functional Status.   Therapy/Group: Individual Therapy  Leroy Libman 12/24/2017, 12:03 PM

## 2017-12-24 NOTE — Plan of Care (Signed)
  Problem: Consults Goal: RH SPINAL CORD INJURY PATIENT EDUCATION Description  See Patient Education module for education specifics.  Outcome: Progressing   Problem: SCI BOWEL ELIMINATION Goal: RH STG MANAGE BOWEL WITH ASSISTANCE Description STG Manage Bowel with min Assistance.  Outcome: Progressing   Problem: SCI BLADDER ELIMINATION Goal: RH STG MANAGE BLADDER WITH ASSISTANCE Description STG Manage Bladder With min Assistance  Outcome: Progressing   Problem: RH SKIN INTEGRITY Goal: RH STG SKIN FREE OF INFECTION/BREAKDOWN Description No new breakdown with min assist   Outcome: Progressing Goal: RH STG ABLE TO PERFORM INCISION/WOUND CARE W/ASSISTANCE Description STG Able To Perform Incision/Wound Care With [employer].  Outcome: Progressing   Problem: RH SAFETY Goal: RH STG ADHERE TO SAFETY PRECAUTIONS W/ASSISTANCE/DEVICE Description STG Adhere to Safety Precautions With min Assistance/Device.  Outcome: Progressing   Problem: RH PAIN MANAGEMENT Goal: RH STG PAIN MANAGED AT OR BELOW PT'S PAIN GOAL Description <3 out of 10.   Outcome: Progressing

## 2017-12-24 NOTE — Progress Notes (Signed)
Recreational Therapy Session Note  Patient Details  Name: Franklin Hicks MRN: [id number] Date of Birth: 04/03/1951 Today's Date: 12/24/2017  Met with pt briefly during Animal Assisted Activities yesterday afternoon per pt request.  Full TR eval to be completed early next week. [place] 12/24/2017, 10:25 AM

## 2017-12-25 ENCOUNTER — Inpatient Hospital Stay (HOSPITAL_COMMUNITY): Payer: Medicare Other

## 2017-12-25 ENCOUNTER — Inpatient Hospital Stay (HOSPITAL_COMMUNITY): Payer: Medicare Other | Admitting: Physical Therapy

## 2017-12-25 ENCOUNTER — Inpatient Hospital Stay (HOSPITAL_COMMUNITY): Payer: Medicare Other | Admitting: Occupational Therapy

## 2017-12-25 LAB — GLUCOSE, CAPILLARY
GLUCOSE-CAPILLARY: 180 mg/dL — AB (ref 65–99)
Glucose-Capillary: 146 mg/dL — ABNORMAL HIGH (ref 65–99)
Glucose-Capillary: 127 mg/dL — ABNORMAL HIGH (ref 65–99)
Glucose-Capillary: 120 mg/dL — ABNORMAL HIGH (ref 65–99)
Glucose-Capillary: 117 mg/dL — ABNORMAL HIGH (ref 65–99)

## 2017-12-25 NOTE — Plan of Care (Signed)
  Problem: Consults Goal: RH SPINAL CORD INJURY PATIENT EDUCATION Description  See Patient Education module for education specifics.  Outcome: Progressing   Problem: SCI BOWEL ELIMINATION Goal: RH STG MANAGE BOWEL WITH ASSISTANCE Description STG Manage Bowel with min Assistance.  Outcome: Progressing   Problem: SCI BLADDER ELIMINATION Goal: RH STG MANAGE BLADDER WITH ASSISTANCE Description STG Manage Bladder With min Assistance  Outcome: Progressing   Problem: RH SKIN INTEGRITY Goal: RH STG SKIN FREE OF INFECTION/BREAKDOWN Description No new breakdown with min assist   Outcome: Progressing Goal: RH STG ABLE TO PERFORM INCISION/WOUND CARE W/ASSISTANCE Description STG Able To Perform Incision/Wound Care With Min Assistance.  Outcome: Progressing   Problem: RH SAFETY Goal: RH STG ADHERE TO SAFETY PRECAUTIONS W/ASSISTANCE/DEVICE Description STG Adhere to Safety Precautions With min Assistance/Device.  Outcome: Progressing   Problem: RH PAIN MANAGEMENT Goal: RH STG PAIN MANAGED AT OR BELOW PT'S PAIN GOAL Description <3 out of 10.   Outcome: Progressing   

## 2017-12-25 NOTE — Progress Notes (Signed)
Occupational Therapy Session Note  Patient Details  Name: Franklin Hicks MRN: [id number] Date of Birth: 09/12/1950  Today's Date: 12/25/2017 OT Individual Time: 1000-1030 OT Individual Time Calculation (min): 30 min    Short Term Goals: Week 1:  OT Short Term Goal 1 (Week 1): Pt will be able to stand using a bariatric stedy lift with max A of 1. OT Short Term Goal 1 - Progress (Week 1): Progressing toward goal OT Short Term Goal 2 (Week 1): Pt will be able to tolerate standing in a stedy fully upright for at least 2 min. OT Short Term Goal 2 - Progress (Week 1): Progressing toward goal OT Short Term Goal 3 (Week 1): pt will have improved R shoulder AROM to reach to wash under his L arm. OT Short Term Goal 3 - Progress (Week 1): Progressing toward goal OT Short Term Goal 4 (Week 1): Pt will have improved L shoulder AROM to be able to use L hand to pull shirt over R arm. OT Short Term Goal 4 - Progress (Week 1): Met OT Short Term Goal 5 (Week 1): Pt will be able to use R hand to use utensils with set up only. OT Short Term Goal 5 - Progress (Week 1): Met  Skilled Therapeutic Interventions/Progress Updates:    1:1 continued to focus on UE strengthening and coordination. Pt now able to tolerate a wide w/c with cushion and pillow underneath him. Pt performed 7 min on the SCI Fit arm bike with 3 rest breaks at the 2 min 30 second mark. Pt with increased endurance today and enjoyed being out of the room. Pt with max  +2 to get into STEDY from w/c to transition to the edge of the bed. PT able to come into standing position from the perched seat on the STEDY with mod +2.   Therapy Documentation Precautions:  Precautions Precautions: Cervical Required Braces or Orthoses: Cervical Brace Cervical Brace: Hard collar Restrictions Weight Bearing Restrictions: No Pain: Pain Assessment Pain Scale: 0-10 Pain Score: 0-No pain ADL: ADL ADL Comments: refer to functional navigator  See Function  Navigator for Current Functional Status.   Therapy/Group: Individual Therapy  Willeen Cass [facility] 12/25/2017, 11:18 AM

## 2017-12-25 NOTE — Progress Notes (Signed)
[place] PHYSICAL MEDICINE & REHABILITATION     PROGRESS NOTE    Subjective/Complaints: Up with OT working with hands. Slept well. Bowels and bladder are functioning  ROS: Patient denies fever, rash, sore throat, blurred vision, nausea, vomiting, diarrhea, cough, shortness of breath or chest pain, joint or back pain, headache, or mood change.   Objective: Vital Signs: Blood pressure (!) 144/63, pulse 75, temperature 98.4 F (36.9 C), temperature source Oral, resp. rate 18, height 6' (1.829 m), weight (!) 170.3 kg (375 lb 7.1 oz), SpO2 97 %. No results found. No results for input(s): WBC, HGB, HCT, PLT in the last 72 hours. No results for input(s): NA, K, CL, GLUCOSE, BUN, CREATININE, CALCIUM in the last 72 hours.  Invalid input(s): CO CBG (last 3)  Recent Labs    12/24/17 1631 12/24/17 2120 12/25/17 0643  GLUCAP 104* 122* 127*    Wt Readings from Last 3 Encounters:  12/24/17 (!) 170.3 kg (375 lb 7.1 oz)  12/15/17 (!) 173.1 kg (381 lb 9.9 oz)  09/24/17 (!) 160 kg (352 lb 12.8 oz)    Physical Exam:  Constitutional: No distress . Vital signs reviewed. Morbidly obese HEENT: EOMI, oral membranes moist Neck: supple Cardiovascular: RRR without murmur. No JVD    Respiratory: CTA Bilaterally without wheezes or rales. Normal effort    GI: BS +, non-tender, non-distended  Musculoskeletal: He exhibits edema tr to 1+ LE  Min edema BLE.  Neurological: He is alert and oriented to person, place, and time. No cranial nerve deficit.  Alert and appropriate. LUE 3- to 3/5 prox to distal still with difficulty in [facility]. RUE 3 to 3+/5. LLE 2+/5 with inhibition due to knee injury. RLE 3+/5. Senses pain and gross touch in all 4's.  Skin: Skin is warm and dry. He is not diaphoretic.  Psychiatric: Pleasant and appropriate.     Assessment/Plan: 1. Functional deficits and tetraplegia secondary to incomplete C4 spinal cord injury which require 3+ hours per day of interdisciplinary therapy in a  comprehensive inpatient rehab setting. Physiatrist is providing close team supervision and 24 hour management of active medical problems listed below. Physiatrist and rehab team continue to assess barriers to discharge/monitor patient progress toward functional and medical goals.  Function:  Bathing Bathing position Bathing activity did not occur: N/A(night bath) Position: Bed  Bathing parts Body parts bathed by patient: Right arm, Left arm, Right upper leg, Chest, Abdomen, Front perineal area, Left upper leg, Right lower leg, Left lower leg Body parts bathed by helper: Right arm, Left arm, Chest, Abdomen, Front perineal area, Buttocks, Right upper leg, Left upper leg, Right lower leg, Left lower leg, Back  Bathing assist Assist Level: 2 helpers      Upper Body Dressing/Undressing Upper body dressing Upper body dressing/undressing activity did not occur: (preferred to work on UE strength & use today rather than bathe and dress again.   Stated he did so modifiied bathing on saturday or sunday) What is the patient wearing?: Hospital gown     Pull over shirt/dress - Perfomed by patient: Thread/unthread right sleeve, Thread/unthread left sleeve Pull over shirt/dress - Perfomed by helper: Thread/unthread right sleeve, Thread/unthread left sleeve        Upper body assist Assist Level: 2 helpers      Lower Body Dressing/Undressing Lower body dressing Lower body dressing/undressing activity did not occur: N/A What is the patient wearing?: Non-skid slipper socks       Pants- Performed by helper: Thread/unthread right pants leg, Thread/unthread  left pants leg, Pull pants up/down   Non-skid slipper socks- Performed by helper: Don/doff right sock, Don/doff left sock                  Lower body assist Assist for lower body dressing: 2 Helpers      Toileting Toileting Toileting activity did not occur: No continent bowel/bladder event   Toileting steps completed by helper: Adjust  clothing prior to toileting, Performs perineal hygiene, Adjust clothing after toileting Toileting Assistive Devices: Toilet aid  Toileting assist Assist level: Two helpers   Transfers Chair/bed transfer Chair/bed transfer activity did not occur: Safety/medical concerns Chair/bed transfer method: Other Chair/bed transfer assist level: 2 helpers Chair/bed transfer assistive device: Mechanical lift Mechanical lift: Landscape architect Ambulation activity did not occur: Safety/medical Investment banker, operational activity did not occur: Safety/medical concerns        Cognition Comprehension Comprehension assist level: Understands complex 90% of the time/cues 10% of the time  Expression Expression assist level: Expresses basic 90% of the time/requires cueing < 10% of the time.  Social Interaction Social Interaction assist level: Interacts appropriately 90% of the time - Needs monitoring or encouragement for participation or interaction.  Problem Solving Problem solving assist level: Solves basic 90% of the time/requires cueing < 10% of the time  Memory Memory assist level: Recognizes or recalls 90% of the time/requires cueing < 10% of the time  Medical Problem List and Plan:  1. Functional deficits secondary to C4 Spinal cord injury, motor and sensory incomplete  -Continue therapies PT and OT 2. DVT/PE/Anticoagulation: Patient now on Xarelto.  Reviewed long term need for xarelto with patient 3. Pain Management: managed with prn medications.    -  gabapentin 100 mg 3 times daily for neuropathic pain with improved control 4. Mood: LCSW to follow for evaluation and support.  5. Neuropsych: This patient is capable of making decisions on his own behalf.  6. Skin/Wound Care: continue pressure relief measures, appropriate nutrition 7. Fluids/Electrolytes/Nutrition: encourage PO 8. T2DM: Monitor BS ac/hs and use SSI for elevated BS. Continue to titrate medications for  tighter control.   -Good control ongoing 5/3 9. CAD: Managed with Lipitor, Plavix, and metoprolol  10. Morbid obesity: Body mass index is 51.76 kg/m.  Diet and exercise education ongoing   11. Combined systolic/diastolic CHF: Monitor weights daily. Continue Lasix, Plavix, statin and metoprolol.    -Weights remain controlled 5/3 Filed Weights   12/22/17 0143 12/23/17 0428 12/24/17 0156  Weight: (!) 170.3 kg (375 lb 7.1 oz) (!) 170.4 kg (375 lb 10.6 oz) (!) 170.3 kg (375 lb 7.1 oz)   12. HTN: Monitor BP bid. Continue terazosin and furosemide.  13. H/o BPH: Reports voiding without difficulty--on Hytrin.   14. Bowel and bladder: appears to be continent    -working on OOB to void.  His size is an impediment 15. Seasonal allergies?   LOS (Days) 8 A FACE TO FACE EVALUATION WAS PERFORMED  Ranelle Oyster, MD 12/25/2017 8:53 AM

## 2017-12-25 NOTE — Progress Notes (Signed)
Physical Therapy Weekly Progress Note  Patient Details  Name: Franklin Hicks MRN: [id number] Date of Birth: [birth year]  Beginning of progress report period: December 18, 2017 End of progress report period: Dec 25, 2017  Today's Date: 12/25/2017 PT Individual Time: 1300-1400 PT Individual Time Calculation (min): 60 min   Patient has met 1 of 4 short term goals.  Pt has made slow progress with therapy, but mostly has been limited by body habitus and size.  Once therapy team was able to secure proper equipment, pt has progressed much more quickly.  Pt able to transition to standing with stedy from significantly elevated surface, and able to tolerate either EOB or OOB to chair multiple hours at a time.  Patient continues to demonstrate the following deficits muscle weakness, muscle joint tightness and muscle paralysis, decreased cardiorespiratoy endurance, impaired timing and sequencing, abnormal tone and unbalanced muscle activation and decreased sitting balance, decreased standing balance, decreased postural control, decreased balance strategies and quadraparesis and therefore will continue to benefit from skilled PT intervention to increase functional independence with mobility.  Patient progressing toward long term goals..  Continue plan of care.  PT Short Term Goals Week 1:  PT Short Term Goal 1 (Week 1): Pt will roll L to full side lying with use of bed features and +1 assist for pressure relief and to reduce burden of care for hygiene/dressing PT Short Term Goal 1 - Progress (Week 1): Not met PT Short Term Goal 2 (Week 1): Pt will verbalize 1 method of pressure relief in w/c with min verbal cues PT Short Term Goal 2 - Progress (Week 1): Not met PT Short Term Goal 3 (Week 1): Pt will demonstate lateral leans to elbow (R and L) and return to midline from EOB or therapy mat with min assist PT Short Term Goal 3 - Progress (Week 1): Not met PT Short Term Goal 4 (Week 1): Pt will tolerate out of bed x30  minutes outside of therapy sessions PT Short Term Goal 4 - Progress (Week 1): Met Week 2:  PT Short Term Goal 1 (Week 2): Pt will roll in bed with +1 assist and use of bed features for pressure relief and to reduce burden of care for hygiene and dressing.  PT Short Term Goal 2 (Week 2): Pt will use UEs to facilitate upright posture in lift equipment for up to 1 minute at a time for UE/trunk strengthening and improved postural control PT Short Term Goal 3 (Week 2): Pt will demonstrate lateral leans to R/L elbow to reduce burden of care for ADLs from EOB or in w/c.   PT Short Term Goal 4 (Week 2): Pt will initiate w/c propulsion for UE strengthening and cardiovascular endurance.   Skilled Therapeutic Interventions/Progress Updates:    no c/o pain.  Session focus on bed mobility, scooting, sit<.>stand transfers and standing tolerance with upright posture.    Pt able to use bed features to come to EOB with supervision and increased time.  Scooting along EOB requires max assist +1 for repositioning.  Sit<>stand x5 throughout session from significantly elevated surfaces (height of stedy seat or higher) with up to mod assist and greatly increased time.  Focus on use of UEs and forward gaze to maintain upward momentum to stand, as pt tends to flex trunk 90 degrees and shift weight onto LEs before pushing up with UEs.  Pt able to tolerate up to 1 minute of standing with intermittently flexed posture, cues for knee control and  use of UEs to maintain posture.  Pt focus on slow/controlled descent without allowing RLE to buckle, which was successful 4/5 times.  Pt returned to EOB at end of session and set up with lunch tray.  Call bell in reach and needs met.   Therapy Documentation Precautions:  Precautions Precautions: Cervical Required Braces or Orthoses: Cervical Brace Cervical Brace: Hard collar Restrictions Weight Bearing Restrictions: No   See Function Navigator for Current Functional  Status.  Therapy/Group: Individual Therapy  Michel Santee 12/25/2017, 2:07 PM

## 2017-12-25 NOTE — Progress Notes (Signed)
Physical Therapy Session Note  Patient Details  Name: Franklin Hicks MRN: [id number] Date of Birth: [birth year]  Today's Date: 12/25/2017 PT Individual Time:[phone]   55 min   Short Term Goals: Week 1:  PT Short Term Goal 1 (Week 1): Pt will roll L to full side lying with use of bed features and +1 assist for pressure relief and to reduce burden of care for hygiene/dressing PT Short Term Goal 2 (Week 1): Pt will verbalize 1 method of pressure relief in w/c with min verbal cues PT Short Term Goal 3 (Week 1): Pt will demonstate lateral leans to elbow (R and L) and return to midline from EOB or therapy mat with min assist PT Short Term Goal 4 (Week 1): Pt will tolerate out of bed x30 minutes outside of therapy sessions  Skilled Therapeutic Interventions/Progress Updates:   Sit>stand in stedy with mod assist and increased time. Pt required to shift weigh tinto feet with hips flexed before attempting trunk extension. Stand >sit to [facility] with mod assist due to pt anxiety and reported L knee pain with flexions.   WC mobility 61f+50ft. And min assist from PT to maintain straight path due to LUE weakness. PT applied tband to L wheel rim to improve traction and improve equal force through BUE  BLE therex: knee flexion/extension 2 x 15 BLE. hip flexion Within AROM x 10 BLE, hip abduction with light  Manual resistance x 12 BLE   Manual therapy to the LLE to improve knee flexion ROM. Grade 3 anterior mobilizations 3 bouts x 30 seconds. AROM with overpressure into flexion to improve end range. Pt noted to achieve ~ 80deg flexion at end   [facility] and all needs met.        Therapy Documentation Precautions:  Precautions Precautions: Cervical Required Braces or Orthoses: Cervical Brace Cervical Brace: Hard collar Restrictions Weight Bearing Restrictions: No Pain: Pain Assessment Pain Scale: 0-10 Pain Score: 0-No pain   See Function  Navigator for Current Functional Status.   Therapy/Group: Individual Therapy  ALorie Phenix5/10/2017, 10:16 AM

## 2017-12-25 NOTE — Progress Notes (Signed)
Occupational Therapy Session Note  Patient Details  Name: Franklin Hicks MRN: [id number] Date of Birth: 28-Feb-1951  Today's Date: 12/25/2017 OT Individual Time: 0700-0800 OT Individual Time Calculation (min): 60 min    Short Term Goals: Week 1:  OT Short Term Goal 1 (Week 1): Pt will be able to stand using a bariatric stedy lift with max A of 1. OT Short Term Goal 1 - Progress (Week 1): Progressing toward goal OT Short Term Goal 2 (Week 1): Pt will be able to tolerate standing in a stedy fully upright for at least 2 min. OT Short Term Goal 2 - Progress (Week 1): Progressing toward goal OT Short Term Goal 3 (Week 1): pt will have improved R shoulder AROM to reach to wash under his L arm. OT Short Term Goal 3 - Progress (Week 1): Progressing toward goal OT Short Term Goal 4 (Week 1): Pt will have improved L shoulder AROM to be able to use L hand to pull shirt over R arm. OT Short Term Goal 4 - Progress (Week 1): Met OT Short Term Goal 5 (Week 1): Pt will be able to use R hand to use utensils with set up only. OT Short Term Goal 5 - Progress (Week 1): Met  Skilled Therapeutic Interventions/Progress Updates:    1;1. Pt dons pants in supine and rolls B with MOD A to advance pant past hips. Pt completes UB dressing EOB with A to thread head and pull shirt down back. Pt grooms EOB with touching A at elbows to support hands. Dicussed support of elbow on table to decrease degrees of freedom. Pt sits EOB and practicies open/closing of containers for breakfast with VC for use of LUE to stabilize and RUE to open/close. Pt also opens/closes jars, mild jugs and flip tops for improved BUE use/coordination. Pt manages 3 large buttons, zipper, and velcro while discussing adaptive fasteners for clothing. Exited session with pt seated EOB with all needs in reach and RN aware of position  Therapy Documentation Precautions:  Precautions Precautions: Cervical Required Braces or Orthoses: Cervical Brace Cervical  Brace: Hard collar Restrictions Weight Bearing Restrictions: No  See Function Navigator for Current Functional Status.   Therapy/Group: Individual Therapy  Tonny Branch 12/25/2017, 7:59 AM

## 2017-12-26 ENCOUNTER — Inpatient Hospital Stay (HOSPITAL_COMMUNITY): Payer: Medicare Other | Admitting: Physical Therapy

## 2017-12-26 ENCOUNTER — Inpatient Hospital Stay (HOSPITAL_COMMUNITY): Payer: Medicare Other

## 2017-12-26 DIAGNOSIS — E119 Type 2 diabetes mellitus without complications: Secondary | ICD-10-CM

## 2017-12-26 DIAGNOSIS — I5042 Chronic combined systolic (congestive) and diastolic (congestive) heart failure: Secondary | ICD-10-CM

## 2017-12-26 LAB — GLUCOSE, CAPILLARY
GLUCOSE-CAPILLARY: 123 mg/dL — AB (ref 65–99)
GLUCOSE-CAPILLARY: 128 mg/dL — AB (ref 65–99)
Glucose-Capillary: 136 mg/dL — ABNORMAL HIGH (ref 65–99)
Glucose-Capillary: 112 mg/dL — ABNORMAL HIGH (ref 65–99)

## 2017-12-26 NOTE — Progress Notes (Signed)
[place] PHYSICAL MEDICINE & REHABILITATION     PROGRESS NOTE    Subjective/Complaints: Patient seen lying in bed this morning. He states he slept well overnight.  ROS: Denies CP, SOB, N/V/D  Objective: Vital Signs: Blood pressure 137/62, pulse 85, temperature 98 F (36.7 C), temperature source Oral, resp. rate 18, height 6' (1.829 m), weight (!) 170.3 kg (375 lb 7.1 oz), SpO2 94 %. No results found. No results for input(s): WBC, HGB, HCT, PLT in the last 72 hours. No results for input(s): NA, K, CL, GLUCOSE, BUN, CREATININE, CALCIUM in the last 72 hours.  Invalid input(s): CO CBG (last 3)  Recent Labs    12/25/17 1625 12/25/17 2128 12/26/17 0658  GLUCAP 120* 117* 123*    Wt Readings from Last 3 Encounters:  12/24/17 (!) 170.3 kg (375 lb 7.1 oz)  12/15/17 (!) 173.1 kg (381 lb 9.9 oz)  09/24/17 (!) 160 kg (352 lb 12.8 oz)    Physical Exam:  Constitutional: No distress . Vital signs reviewed. Obese HENT: Normocephalic.  Atraumatic. Eyes: EOMI. No discharge. Cardiovascular: RRR. No JVD. Respiratory: CTA Bilaterally. Normal effort. GI: BS +. Non-distended. Musculoskeletal: He exhibits bilateral lower extremity edema  Neurological: He is alert and oriented  Motor: LUE 4-/5 prox to distal  RUE 4-/5 proximal to distal.  LLE: 2-2+/5 with inhibition due to knee injury.  RLE: 3/5 hip flexion, 3+/5 nee extension Skin: Skin is warm and dry. He is not diaphoretic.  Psychiatric: Pleasant and appropriate.    Assessment/Plan: 1. Functional deficits and tetraplegia secondary to incomplete C4 spinal cord injury which require 3+ hours per day of interdisciplinary therapy in a comprehensive inpatient rehab setting. Physiatrist is providing close team supervision and 24 hour management of active medical problems listed below. Physiatrist and rehab team continue to assess barriers to discharge/monitor patient progress toward functional and medical  goals.  Function:  Bathing Bathing position Bathing activity did not occur: N/A(night bath) Position: Bed  Bathing parts Body parts bathed by patient: Right arm, Left arm, Right upper leg, Chest, Abdomen, Front perineal area, Left upper leg, Right lower leg, Left lower leg Body parts bathed by helper: Right arm, Left arm, Chest, Abdomen, Front perineal area, Buttocks, Right upper leg, Left upper leg, Right lower leg, Left lower leg, Back  Bathing assist Assist Level: 2 helpers      Upper Body Dressing/Undressing Upper body dressing Upper body dressing/undressing activity did not occur: (preferred to work on UE strength & use today rather than bathe and dress again.   Stated he did so modifiied bathing on saturday or sunday) What is the patient wearing?: Hospital gown     Pull over shirt/dress - Perfomed by patient: Thread/unthread right sleeve, Thread/unthread left sleeve Pull over shirt/dress - Perfomed by helper: Thread/unthread right sleeve, Thread/unthread left sleeve        Upper body assist Assist Level: 2 helpers      Lower Body Dressing/Undressing Lower body dressing Lower body dressing/undressing activity did not occur: N/A What is the patient wearing?: Non-skid slipper socks       Pants- Performed by helper: Thread/unthread right pants leg, Thread/unthread left pants leg, Pull pants up/down   Non-skid slipper socks- Performed by helper: Don/doff right sock, Don/doff left sock                  Lower body assist Assist for lower body dressing: 2 Helpers      Toileting Toileting Toileting activity did not occur: No continent bowel/bladder  event   Toileting steps completed by helper: Adjust clothing prior to toileting, Performs perineal hygiene, Adjust clothing after toileting Toileting Assistive Devices: Toilet aid  Toileting assist Assist level: Two helpers   Transfers Chair/bed transfer Chair/bed transfer activity did not occur: Safety/medical  concerns Chair/bed transfer method: Other Chair/bed transfer assist level: 2 helpers Chair/bed transfer assistive device: Mechanical lift Mechanical lift: Landscape architect Ambulation activity did not occur: Safety/medical Investment banker, operational activity did not occur: Safety/medical concerns        Cognition Comprehension Comprehension assist level: Understands complex 90% of the time/cues 10% of the time  Expression Expression assist level: Expresses basic 90% of the time/requires cueing < 10% of the time.  Social Interaction Social Interaction assist level: Interacts appropriately with others - No medications needed.  Problem Solving Problem solving assist level: Solves basic 90% of the time/requires cueing < 10% of the time  Memory Memory assist level: Recognizes or recalls 90% of the time/requires cueing < 10% of the time  Medical Problem List and Plan:  1. Functional deficits secondary to C4 Spinal cord injury, motor and sensory incomplete   Continue CIR  Notes reviewed, images reviewed, labs reviewed 2. DVT/PE/Anticoagulation: Patient now on Xarelto.  Reviewed long term need for xarelto with patient 3. Pain Management: managed with prn medications.    -  gabapentin 100 mg 3 times daily for neuropathic pain with improved control 4. Mood: LCSW to follow for evaluation and support.  5. Neuropsych: This patient is capable of making decisions on his own behalf.  6. Skin/Wound Care: continue pressure relief measures, appropriate nutrition 7. Fluids/Electrolytes/Nutrition: encourage PO  BMP within acceptable range on 4/29 8. T2DM: Monitor BS ac/hs and use SSI for elevated BS. .   Relatively controlled on 5/4 9. CAD: Managed with Lipitor, Plavix, and metoprolol  10. Morbid obesity: Body mass index is 51.76 kg/m.  Diet and exercise education ongoing   11. Combined systolic/diastolic CHF: Monitor weights daily. Continue Lasix, Plavix, statin and  metoprolol.   Filed Weights   12/22/17 0143 12/23/17 0428 12/24/17 0156  Weight: (!) 170.3 kg (375 lb 7.1 oz) (!) 170.4 kg (375 lb 10.6 oz) (!) 170.3 kg (375 lb 7.1 oz)   Stable on 5/4 12. HTN: Monitor BP bid. Continue terazosin and furosemide.   Controlled on 5/4 13. H/o BPH: Reports voiding without difficulty--on Hytrin.  14. Bowel and bladder: appears to be continent    -working on OOB to void.  His size is an impediment 15. Seasonal allergies?   LOS (Days) 9 A FACE TO FACE EVALUATION WAS PERFORMED  Waunita Sandstrom Karis Juba, MD 12/26/2017 9:01 AM

## 2017-12-26 NOTE — Plan of Care (Signed)
  Problem: RH SAFETY Goal: RH STG ADHERE TO SAFETY PRECAUTIONS W/ASSISTANCE/DEVICE Description STG Adhere to Safety Precautions With min Assistance/Device.  Outcome: Progressing  He can sat on side of bed.

## 2017-12-26 NOTE — Progress Notes (Addendum)
Physical Therapy Session Note  Patient Details  Name: Franklin Hicks MRN: [id number] Date of Birth: 01/17/1951  Today's Date: 12/26/2017 PT Individual Time: 1400-1500 PT Individual Time Calculation (min): 60 min   Short Term Goals: Week 2:  PT Short Term Goal 1 (Week 2): Pt will roll in bed with +1 assist and use of bed features for pressure relief and to reduce burden of care for hygiene and dressing.  PT Short Term Goal 2 (Week 2): Pt will use UEs to facilitate upright posture in lift equipment for up to 1 minute at a time for UE/trunk strengthening and improved postural control PT Short Term Goal 3 (Week 2): Pt will demonstrate lateral leans to R/L elbow to reduce burden of care for ADLs from EOB or in w/c.   PT Short Term Goal 4 (Week 2): Pt will initiate w/c propulsion for UE strengthening and cardiovascular endurance.   Skilled Therapeutic Interventions/Progress Updates:   Pt sitting EOB and agreeable to therapy, denies pain. Session focused on functional mobility and endurance. Pt donned shirt w/ mod assist and pants total assist for time management. Performed sit>stand from elevated bed to stedy w/ mod assist x2 for safety w/ increased time, total assist to pull pants over hips. Transferred to w/c via stedy and performed self-care tasks at sink w/ set-up assist from w/c including washing face and brushing teeth. Pt self-propelled w/c to gym w/ mod assist to compensate for LUE weakness, 100'. Worked on sit<>stands to stedy from w/c in therapy gym, min assist overall w/ increased time. 2nd helper present for safety but was not hands-on, 2 standing reps. Returned to room and ended session in w/c, call bell within reach and all needs met.   Therapy Documentation Precautions:  Precautions Precautions: Cervical Required Braces or Orthoses: Cervical Brace Cervical Brace: Hard collar Restrictions Weight Bearing Restrictions: No  See Function Navigator for Current Functional  Status.   Therapy/Group: Individual Therapy   K Arnette 12/26/2017, 3:00 PM  

## 2017-12-27 ENCOUNTER — Inpatient Hospital Stay (HOSPITAL_COMMUNITY): Payer: Medicare Other | Admitting: Occupational Therapy

## 2017-12-27 DIAGNOSIS — M79672 Pain in left foot: Secondary | ICD-10-CM

## 2017-12-27 DIAGNOSIS — E669 Obesity, unspecified: Secondary | ICD-10-CM

## 2017-12-27 DIAGNOSIS — E1169 Type 2 diabetes mellitus with other specified complication: Secondary | ICD-10-CM

## 2017-12-27 LAB — GLUCOSE, CAPILLARY
GLUCOSE-CAPILLARY: 119 mg/dL — AB (ref 65–99)
GLUCOSE-CAPILLARY: 150 mg/dL — AB (ref 65–99)
Glucose-Capillary: 115 mg/dL — ABNORMAL HIGH (ref 65–99)
Glucose-Capillary: 111 mg/dL — ABNORMAL HIGH (ref 65–99)

## 2017-12-27 NOTE — Progress Notes (Signed)
[place] PHYSICAL MEDICINE & REHABILITATION     PROGRESS NOTE    Subjective/Complaints: Patient seen lying in bed this morning. He states he slept well overnight. He notes pain in his left heel.  ROS: denies CP, SOB, N/V/D  Objective: Vital Signs: Blood pressure 129/73, pulse 85, temperature 97.6 F (36.4 C), temperature source Oral, resp. rate 18, height 6' (1.829 m), weight (!) 170.3 kg (375 lb 7.1 oz), SpO2 95 %. No results found. No results for input(s): WBC, HGB, HCT, PLT in the last 72 hours. No results for input(s): NA, K, CL, GLUCOSE, BUN, CREATININE, CALCIUM in the last 72 hours.  Invalid input(s): CO CBG (last 3)  Recent Labs    12/26/17 2143 12/27/17 0653 12/27/17 1150  GLUCAP 112* 119* 115*    Wt Readings from Last 3 Encounters:  12/24/17 (!) 170.3 kg (375 lb 7.1 oz)  12/15/17 (!) 173.1 kg (381 lb 9.9 oz)  09/24/17 (!) 160 kg (352 lb 12.8 oz)    Physical Exam:  Constitutional: No distress . Vital signs reviewed. Obese HENT: Normocephalic.  Atraumatic. Eyes: EOMI. No discharge. Cardiovascular: RRR. No JVD. Respiratory: CTA Bilaterally. Normal effort. GI: BS +. Non-distended. Musculoskeletal: He exhibits bilateral lower extremity edema. Left heel TTP. Neurological: He is alert and oriented  Motor: LUE 4-/5 prox to distal  RUE 4-/5 proximal to distal.  LLE: 2-2+/5 with inhibition due to knee injury. (stable)  RLE: 3/5 hip flexion, 3+/5 knee extension (stable). Skin: Skin is warm and dry. He is not diaphoretic.  Psychiatric: Pleasant and appropriate.    Assessment/Plan: 1. Functional deficits and tetraplegia secondary to incomplete C4 spinal cord injury which require 3+ hours per day of interdisciplinary therapy in a comprehensive inpatient rehab setting. Physiatrist is providing close team supervision and 24 hour management of active medical problems listed below. Physiatrist and rehab team continue to assess barriers to discharge/monitor patient  progress toward functional and medical goals.  Function:  Bathing Bathing position Bathing activity did not occur: N/A(night bath) Position: Bed  Bathing parts Body parts bathed by patient: Right arm, Left arm, Right upper leg, Chest, Abdomen, Front perineal area, Left upper leg, Right lower leg, Left lower leg Body parts bathed by helper: Right arm, Left arm, Chest, Abdomen, Front perineal area, Buttocks, Right upper leg, Left upper leg, Right lower leg, Left lower leg, Back  Bathing assist Assist Level: 2 helpers      Upper Body Dressing/Undressing Upper body dressing Upper body dressing/undressing activity did not occur: (preferred to work on UE strength & use today rather than bathe and dress again.   Stated he did so modifiied bathing on saturday or sunday) What is the patient wearing?: Hospital gown     Pull over shirt/dress - Perfomed by patient: Thread/unthread right sleeve, Thread/unthread left sleeve Pull over shirt/dress - Perfomed by helper: Thread/unthread right sleeve, Thread/unthread left sleeve        Upper body assist Assist Level: 2 helpers      Lower Body Dressing/Undressing Lower body dressing Lower body dressing/undressing activity did not occur: N/A What is the patient wearing?: Non-skid slipper socks       Pants- Performed by helper: Thread/unthread right pants leg, Thread/unthread left pants leg, Pull pants up/down   Non-skid slipper socks- Performed by helper: Don/doff right sock, Don/doff left sock                  Lower body assist Assist for lower body dressing: 2 Helpers  Toileting Toileting Toileting activity did not occur: No continent bowel/bladder event   Toileting steps completed by helper: Adjust clothing prior to toileting, Performs perineal hygiene, Adjust clothing after toileting Toileting Assistive Devices: Toilet aid  Toileting assist Assist level: Two helpers   Transfers Chair/bed transfer Chair/bed transfer activity  did not occur: Safety/medical concerns Chair/bed transfer method: Other Chair/bed transfer assist level: 2 helpers Chair/bed transfer assistive device: Mechanical lift Mechanical lift: Stedy   Locomotion Ambulation Ambulation activity did not occur: Safety/medical Investment banker, operational activity did not occur: Safety/medical concerns   Max wheelchair distance: 100' Assist Level: Moderate assistance (Pt 50 - 74%)  Cognition Comprehension Comprehension assist level: Follows complex conversation/direction with no assist  Expression Expression assist level: Expresses complex ideas: With no assist  Social Interaction Social Interaction assist level: Interacts appropriately with others - No medications needed.  Problem Solving Problem solving assist level: Solves complex problems: Recognizes & self-corrects  Memory Memory assist level: Complete Independence: No helper  Medical Problem List and Plan:  1. Functional deficits secondary to C4 Spinal cord injury, motor and sensory incomplete   Continue CIR 2. DVT/PE/Anticoagulation: Patient now on Xarelto.  Reviewed long term need for xarelto with patient 3. Pain Management: managed with prn medications.    -  gabapentin 100 mg 3 times daily for neuropathic pain with improved control 4. Mood: LCSW to follow for evaluation and support.  5. Neuropsych: This patient is capable of making decisions on his own behalf.  6. Skin/Wound Care: continue pressure relief measures, appropriate nutrition  Prolonged boots ordered 7. Fluids/Electrolytes/Nutrition: encourage PO  BMP within acceptable range on 4/29 8. T2DM: Monitor BS ac/hs and use SSI for elevated BS.    Relatively controlled on 5/5 9. CAD: Managed with Lipitor, Plavix, and metoprolol  10. Morbid obesity: Body mass index is 51.76 kg/m.  Diet and exercise education ongoing   11. Combined systolic/diastolic CHF: Monitor weights daily. Continue Lasix, Plavix, statin and  metoprolol.   Filed Weights   12/22/17 0143 12/23/17 0428 12/24/17 0156  Weight: (!) 170.3 kg (375 lb 7.1 oz) (!) 170.4 kg (375 lb 10.6 oz) (!) 170.3 kg (375 lb 7.1 oz)   Stable on 5/2 12. HTN: Monitor BP bid. Continue terazosin and furosemide.   Labile on 5/5, monitor for trend 13. H/o BPH: Reports voiding without difficulty--on Hytrin.  14. Bowel and bladder: appears to be continent    -working on OOB to void.  His size is an impediment 15. Seasonal allergies?   LOS (Days) 10 A FACE TO FACE EVALUATION WAS PERFORMED  Franklin Hicks Karis Juba, MD 12/27/2017 2:52 PM

## 2017-12-27 NOTE — Progress Notes (Signed)
Occupational Therapy Session Note  Patient Details  Name: AIDYNN KRENN MRN: [id number] Date of Birth: 07/02/1951  Today's Date: 12/27/2017 OT Group Time: 1300-1400 OT Group Time Calculation (min): 60 min  Skilled Therapeutic Interventions/Progress Updates:   Pt participated in therapeutic dance group with focus on UE/LE strengthening and ROM, activity tolerance, and social participation. Pt was guided in dance exercises including forward and overhead claps, straight arm raises, seated marches, ankle pumps, and leg lifts. He tended to move one extremity at a time vs simultaneous engagement. Increased social demands with pt clapping both hands with neighbors and when holding hands and swaying to music for >3 minute windows. Pt required several rest breaks due to fatigue, however motivated to increase strength and exercised without back support on w/c for a few songs to increase challenge. Pt requested a few gospel songs and reported enjoying the group, that: "the Spirit was here with [place]." He was escorted back to room with NT at end of tx.        Therapy Documentation Precautions:  Precautions Precautions: Cervical Required Braces or Orthoses: Cervical Brace Cervical Brace: Hard collar Restrictions Weight Bearing Restrictions: No ADL: ADL ADL Comments: refer to functional navigator     See Function Navigator for Current Functional Status.   Therapy/Group: Group Therapy  [place] A Jermel Artley 12/27/2017, 3:32 PM

## 2017-12-28 ENCOUNTER — Inpatient Hospital Stay (HOSPITAL_COMMUNITY): Payer: Medicare Other

## 2017-12-28 ENCOUNTER — Inpatient Hospital Stay (HOSPITAL_COMMUNITY): Payer: Medicare Other | Admitting: Occupational Therapy

## 2017-12-28 LAB — GLUCOSE, CAPILLARY
GLUCOSE-CAPILLARY: 121 mg/dL — AB (ref 65–99)
GLUCOSE-CAPILLARY: 103 mg/dL — AB (ref 65–99)
Glucose-Capillary: 126 mg/dL — ABNORMAL HIGH (ref 65–99)
Glucose-Capillary: 148 mg/dL — ABNORMAL HIGH (ref 65–99)

## 2017-12-28 LAB — BASIC METABOLIC PANEL
Anion gap: 8 (ref 5–15)
BUN: 21 mg/dL — AB (ref 6–20)
CHLORIDE: 104 mmol/L (ref 101–111)
CO2: 26 mmol/L (ref 22–32)
Calcium: 8.5 mg/dL — ABNORMAL LOW (ref 8.9–10.3)
Creatinine, Ser: 1.09 mg/dL (ref 0.61–1.24)
GFR calc Af Amer: >60 mL/min (ref >60)
Glucose, Bld: 118 mg/dL — ABNORMAL HIGH (ref 65–99)
POTASSIUM: 3.9 mmol/L (ref 3.5–5.1)
Sodium: 138 mmol/L (ref 135–145)

## 2017-12-28 NOTE — Progress Notes (Signed)
Occupational Therapy Session Note  Patient Details  Name: Franklin Hicks MRN: [id number] Date of Birth: 1951-02-02  Today's Date: 12/28/2017 OT Individual Time: 2130-8657 OT Individual Time Calculation (min): 56 min    Short Term Goals: Week 2:  OT Short Term Goal 1 (Week 2): Pt will be able to stand using a bariatric stedy lift with max A of 1. OT Short Term Goal 2 (Week 2): Pt will be able to tolerate standing in a stedy fully upright for at least 2 min. OT Short Term Goal 3 (Week 2): pt will have improved R shoulder AROM to reach to wash under his L arm. OT Short Term Goal 4 (Week 2): Pt will don pullover shirt with min A  Skilled Therapeutic Interventions/Progress Updates:    Upon entering the room, pt seated on EOB starting lunch. OT educating pt on energy conservation for self care tasks while eating. Pt engaged in scapular exercises in all planes x 2 sets of 10 reps each. Pt reports soreness but no pain with exercise. Pt remaining seated on EOB with supervision throughout session. Pt continues sitting on EOB to finish meal with call bell and all needed items within reach upon exiting the room.   Therapy Documentation Precautions:  Precautions Precautions: Cervical Required Braces or Orthoses: Cervical Brace Cervical Brace: Hard collar Restrictions Weight Bearing Restrictions: No General:   Vital Signs: Therapy Vitals Temp: 98.1 F (36.7 C) Temp Source: Oral Pulse Rate: 76 Resp: 18 BP: 118/74 Patient Position (if appropriate): Sitting Oxygen Therapy SpO2: 98 % O2 Device: Room Air ADL: ADL ADL Comments: refer to functional navigator  See Function Navigator for Current Functional Status.   Therapy/Group: Individual Therapy  Alen Bleacher 12/28/2017, 4:33 PM

## 2017-12-28 NOTE — Progress Notes (Signed)
Occupational Therapy Session Note  Patient Details  Name: Franklin Hicks MRN: [id number] Date of Birth: 1951-01-24  Today's Date: 12/28/2017 OT Individual Time: 1030-1200 OT Individual Time Calculation (min): 90 min    Short Term Goals: Week 2:  OT Short Term Goal 1 (Week 2): Pt will be able to stand using a bariatric stedy lift with max A of 1. OT Short Term Goal 2 (Week 2): Pt will be able to tolerate standing in a stedy fully upright for at least 2 min. OT Short Term Goal 3 (Week 2): pt will have improved R shoulder AROM to reach to wash under his L arm. OT Short Term Goal 4 (Week 2): Pt will don pullover shirt with min A  Skilled Therapeutic Interventions/Progress Updates:    Pt seated EOB upon arrival.  OT intervention with focus on dressing tasks seated EOB and bed level for donning pants, sit<>stand with Stedy, [facility] tasks, and BUE PROM/AAROM to increase independence with BADLs.  Pt able to doff shirt this morning and don tshirt with min A to place over head.  Pt requires tot A for donning pants but is able to don socks with use of AE.  Pt completed grooming at sink with setup. Pt performed sit<>stand from EOB with Stedy requiring max A+2. Pt engaged in table tasks with focus on [facility] stacking tokens.  Pt continues to exhibit limited B shoulder AROM>90 degrees. PROM performed and table slides to increase AROM. Pt remained in w/c with all needs within reach.  Therapy Documentation Precautions:  Precautions Precautions: Cervical Required Braces or Orthoses: Cervical Brace Cervical Brace: Hard collar Restrictions Weight Bearing Restrictions: No Pain: Pain Assessment Pain Scale: 0-10 Pain Score: 0-No pain  See Function Navigator for Current Functional Status.   Therapy/Group: Individual Therapy  Rich Brave 12/28/2017, 12:23 PM

## 2017-12-28 NOTE — Progress Notes (Signed)
[place] PHYSICAL MEDICINE & REHABILITATION     PROGRESS NOTE    Subjective/Complaints: States that weekend went well. No problems or setbacks. Emptying bowels and bladder  ROS: Patient denies fever, rash, sore throat, blurred vision, nausea, vomiting, diarrhea, cough, shortness of breath or chest pain, joint or back pain, headache, or mood change.    Objective: Vital Signs: Blood pressure (!) 152/76, pulse 74, temperature 97.9 F (36.6 C), temperature source Oral, resp. rate 18, height 6' (1.829 m), weight (!) 170.3 kg (375 lb 7.1 oz), SpO2 94 %. No results found. No results for input(s): WBC, HGB, HCT, PLT in the last 72 hours. Recent Labs    12/28/17 0625  NA 138  K 3.9  CL 104  GLUCOSE 118*  BUN 21*  CREATININE 1.09  CALCIUM 8.5*   CBG (last 3)  Recent Labs    12/27/17 1702 12/27/17 2105 12/28/17 0622  GLUCAP 150* 111* 126*    Wt Readings from Last 3 Encounters:  12/15/17 (!) 173.1 kg (381 lb 9.9 oz)  09/24/17 (!) 160 kg (352 lb 12.8 oz)  07/28/17 (!) 176.4 kg (389 lb)    Physical Exam:  Constitutional: No distress . Vital signs reviewed. obese HEENT: EOMI, oral membranes moist Neck: supple Cardiovascular: RRR without murmur. No JVD    Respiratory: CTA Bilaterally without wheezes or rales. Normal effort    GI: BS +, non-tender, non-distended  Musculoskeletal: He exhibits bilateral lower extremity edema. Left heel TTP. Neurological: He is alert and oriented  Motor: LUE 3+ to 4- prox to 3/5 HI RUE 4-/5 proximal to distal.  LLE: 2-2+/5 with inhibition due to knee injury-stable RLE: 3/5 hip flexion, 3+/5 knee extension --no change Skin: Skin is warm and dry. He is not diaphoretic.  Psychiatric: Pleasant and appropriate.    Assessment/Plan: 1. Functional deficits and tetraplegia secondary to incomplete C4 spinal cord injury which require 3+ hours per day of interdisciplinary therapy in a comprehensive inpatient rehab setting. Physiatrist is providing  close team supervision and 24 hour management of active medical problems listed below. Physiatrist and rehab team continue to assess barriers to discharge/monitor patient progress toward functional and medical goals.  Function:  Bathing Bathing position Bathing activity did not occur: N/A(night bath) Position: Bed  Bathing parts Body parts bathed by patient: Right arm, Left arm, Right upper leg, Chest, Abdomen, Front perineal area, Left upper leg, Right lower leg, Left lower leg Body parts bathed by helper: Right arm, Left arm, Chest, Abdomen, Front perineal area, Buttocks, Right upper leg, Left upper leg, Right lower leg, Left lower leg, Back  Bathing assist Assist Level: 2 helpers      Upper Body Dressing/Undressing Upper body dressing Upper body dressing/undressing activity did not occur: (preferred to work on UE strength & use today rather than bathe and dress again.   Stated he did so modifiied bathing on saturday or sunday) What is the patient wearing?: Hospital gown     Pull over shirt/dress - Perfomed by patient: Thread/unthread right sleeve, Thread/unthread left sleeve Pull over shirt/dress - Perfomed by helper: Thread/unthread right sleeve, Thread/unthread left sleeve        Upper body assist Assist Level: 2 helpers      Lower Body Dressing/Undressing Lower body dressing Lower body dressing/undressing activity did not occur: N/A What is the patient wearing?: Non-skid slipper socks       Pants- Performed by helper: Thread/unthread right pants leg, Thread/unthread left pants leg, Pull pants up/down   Non-skid slipper socks-  Performed by helper: Don/doff right sock, Don/doff left sock                  Lower body assist Assist for lower body dressing: 2 Helpers      Toileting Toileting Toileting activity did not occur: No continent bowel/bladder event   Toileting steps completed by helper: Adjust clothing prior to toileting, Performs perineal hygiene, Adjust  clothing after toileting Toileting Assistive Devices: Toilet aid  Toileting assist Assist level: Two helpers   Transfers Chair/bed transfer Chair/bed transfer activity did not occur: Safety/medical concerns Chair/bed transfer method: Other Chair/bed transfer assist level: 2 helpers Chair/bed transfer assistive device: Mechanical lift Mechanical lift: Stedy   Locomotion Ambulation Ambulation activity did not occur: Safety/medical Investment banker, operational activity did not occur: Safety/medical concerns   Max wheelchair distance: 100' Assist Level: Moderate assistance (Pt 50 - 74%)  Cognition Comprehension Comprehension assist level: Follows complex conversation/direction with no assist  Expression Expression assist level: Expresses complex ideas: With no assist  Social Interaction Social Interaction assist level: Interacts appropriately with others - No medications needed.  Problem Solving Problem solving assist level: Solves complex problems: Recognizes & self-corrects  Memory Memory assist level: Complete Independence: No helper  Medical Problem List and Plan:  1. Functional deficits secondary to C4 Spinal cord injury, motor and sensory incomplete   Continue CIR 2. DVT/PE/Anticoagulation: Patient now on Xarelto.  Reviewed long term need for xarelto with patient 3. Pain Management: managed with prn medications.    -  gabapentin 100 mg 3 times daily for neuropathic pain with improved control 4. Mood: LCSW to follow for evaluation and support.  5. Neuropsych: This patient is capable of making decisions on his own behalf.  6. Skin/Wound Care: continue pressure relief measures, appropriate nutrition  Prevalon boots ordered 7. Fluids/Electrolytes/Nutrition: encourage PO  BMP within acceptable range on 4/29 8. T2DM: Monitor BS ac/hs and use SSI for elevated BS.    Relatively controlled on 5/6 9. CAD: Managed with Lipitor, Plavix, and metoprolol  10. Morbid obesity:  Body mass index is 51.76 kg/m.  Diet and exercise education ongoing   11. Combined systolic/diastolic CHF: Monitor weights daily. Continue Lasix, Plavix, statin and metoprolol.   Filed Weights   12/23/17 0428 12/24/17 0156  Weight: (!) 170.4 kg (375 lb 10.6 oz) (!) 170.3 kg (375 lb 7.1 oz)   Need daily weights 12. HTN: Monitor BP bid. Continue terazosin and furosemide.   Improved 5/6. Some elevation but inconsistent----observe only for now 13. H/o BPH: Reports voiding without difficulty--on Hytrin.  14. Bowel and bladder: appears to be continent    -working on OOB to void.  His size is an impediment 15. Seasonal allergies?   LOS (Days) 11 A FACE TO FACE EVALUATION WAS PERFORMED  Ranelle Oyster, MD 12/28/2017 9:05 AM

## 2017-12-28 NOTE — Progress Notes (Signed)
Physical Therapy Session Note  Patient Details  Name: Franklin Hicks MRN: [id number] Date of Birth: 1951-01-02  Today's Date: 12/28/2017 PT Individual Time: 1330-1430 PT Individual Time Calculation (min): 60 min   Short Term Goals: Week 2:  PT Short Term Goal 1 (Week 2): Pt will roll in bed with +1 assist and use of bed features for pressure relief and to reduce burden of care for hygiene and dressing.  PT Short Term Goal 2 (Week 2): Pt will use UEs to facilitate upright posture in lift equipment for up to 1 minute at a time for UE/trunk strengthening and improved postural control PT Short Term Goal 3 (Week 2): Pt will demonstrate lateral leans to R/L elbow to reduce burden of care for ADLs from EOB or in w/c.   PT Short Term Goal 4 (Week 2): Pt will initiate w/c propulsion for UE strengthening and cardiovascular endurance.   Skilled Therapeutic Interventions/Progress Updates:    no c/o pain.  Session focus on sit<>stand transfers in stedy for LE strengthening, postural control, and overall endurance.  Discussed transitioning away from stedy as safe and able.    Pt requires supervision for sit<>stand transfer from low w/c and from elevated stedy seat. Relies HEAVILY on UEs to pull himself forward over his LEs before pushing up through UEs.  Discussed and educated pt on trying to progress to pushing up with UEs rather than pulling, but pt reports he was transitioning that way at home prior to admission.  Pt able to tolerate standing in stedy with UE support for trunk extension up to 40 seconds before LEs buckled.  Discussed trying // bars tomorrow.  Pt positioned EOB at end of session (stedy for transfer), call bell in reach and needs met.   Therapy Documentation Precautions:  Precautions Precautions: Cervical Required Braces or Orthoses: Cervical Brace Cervical Brace: Hard collar Restrictions Weight Bearing Restrictions: No   See Function Navigator for Current Functional  Status.   Therapy/Group: Individual Therapy  Michel Santee 12/28/2017, 3:37 PM

## 2017-12-29 ENCOUNTER — Inpatient Hospital Stay (HOSPITAL_COMMUNITY): Payer: Medicare Other | Admitting: Occupational Therapy

## 2017-12-29 ENCOUNTER — Inpatient Hospital Stay (HOSPITAL_COMMUNITY): Payer: Medicare Other | Admitting: Physical Therapy

## 2017-12-29 ENCOUNTER — Inpatient Hospital Stay (HOSPITAL_COMMUNITY): Payer: Medicare Other | Admitting: *Deleted

## 2017-12-29 LAB — GLUCOSE, CAPILLARY
GLUCOSE-CAPILLARY: 129 mg/dL — AB (ref 65–99)
GLUCOSE-CAPILLARY: 118 mg/dL — AB (ref 65–99)
Glucose-Capillary: 120 mg/dL — ABNORMAL HIGH (ref 65–99)
Glucose-Capillary: 120 mg/dL — ABNORMAL HIGH (ref 65–99)

## 2017-12-29 NOTE — Progress Notes (Signed)
Physical Therapy Session Note  Patient Details  Name: Franklin Hicks MRN: [id number] Date of Birth: September 13, 1950  Today's Date: 12/29/2017 PT Individual Time: 1000-1100 PT Individual Time Calculation (min): 60 min   Short Term Goals: Week 2:  PT Short Term Goal 1 (Week 2): Pt will roll in bed with +1 assist and use of bed features for pressure relief and to reduce burden of care for hygiene and dressing.  PT Short Term Goal 2 (Week 2): Pt will use UEs to facilitate upright posture in lift equipment for up to 1 minute at a time for UE/trunk strengthening and improved postural control PT Short Term Goal 3 (Week 2): Pt will demonstrate lateral leans to R/L elbow to reduce burden of care for ADLs from EOB or in w/c.   PT Short Term Goal 4 (Week 2): Pt will initiate w/c propulsion for UE strengthening and cardiovascular endurance.   Skilled Therapeutic Interventions/Progress Updates:    no c/o pain.  Session focus on bed mobility, transfers, and balance for bathing/dressing/ADLs.    Pt requires mod assist to lift LEs into bed to go from sit>supine, however he is able to initiate lifting his LEs into bed and only needs assist to complete the movement.  Set up assist for pt to bathe front peri-area and face.  Min assist and multimodal cues for LE positioning to roll L and R with bed rails for LB dressing.  Pt requires total assist for LB dressing.  Supine>sitting EOB with supervision and use of bed rails.  Sit<>stand in stedy with supervision, increased time, and significant use of UEs to pull weight over LEs.  Transfer to w/c with stedy and pt set up at sink for oral care.  During grooming tasks, PT and pt discussed and problem solved for home entry through back entrance to home, which leads onto main level.  Also discussed potential challenges with w/c width and access to bedroom/bathroom.  Handoff to OT in room.   Therapy Documentation Precautions:  Precautions Precautions: Cervical Required Braces or  Orthoses: Cervical Brace Cervical Brace: Hard collar Restrictions Weight Bearing Restrictions: No   See Function Navigator for Current Functional Status.   Therapy/Group: Individual Therapy  Stephania Fragmin 12/29/2017, 12:30 PM

## 2017-12-29 NOTE — Progress Notes (Signed)
[facility] PH[facility] MEDICINE & REHABILITATION     PROGRESS NOTE    Subjective/Complaints: No new problems overnight. Feels that he's making progress in therapy. Left leg is a barrier from a ROM mgt standpoint. Asked about BP  ROS: Patient denies fever, rash, sore throat, blurred vision, nausea, vomiting, diarrhea, cough, shortness of breath or chest pain, joint or back pain, headache, or mood change.     Objective: Vital Signs: Blood pressure (!) 150/64, pulse 82, temperature 98 F (36.7 C), temperature source Oral, resp. rate 12, height 6' (1.829 m), weight (!) 170.3 kg (375 lb 7.1 oz), SpO2 98 %. No results found. No results for input(s): WBC, HGB, HCT, PLT in the last 72 hours. Recent Labs    12/28/17 0625  NA 138  K 3.9  CL 104  GLUCOSE 118*  BUN 21*  CREATININE 1.09  CALCIUM 8.5*   CBG (last 3)  Recent Labs    12/28/17 1630 12/28/17 2141 12/29/17 0647  GLUCAP 121* 103* 129*    Wt Readings from Last 3 Encounters:  12/15/17 (!) 173.1 kg (381 lb 9.9 oz)  09/24/17 (!) 160 kg (352 lb 12.8 oz)  07/28/17 (!) 176.4 kg (389 lb)    Physical Exam:  Constitutional: No distress . Vital signs reviewed.obese HEENT: EOMI, oral membranes moist Neck: supple Cardiovascular: RRR without murmur. No JVD    Respiratory: CTA Bilaterally without wheezes or rales. Normal effort    GI: BS +, non-tender, non-distended  Musculoskeletal: He exhibits bilateral lower extremity edema. Left heel TTP. Neurological: He is alert and oriented  Motor: LUE 3+ to 4- prox to 3/5 HI RUE 4-/5 proximal to distal.  LLE: 2-2+/5 with inhibition due to knee injury-stable RLE: 3/5 hip flexion, 3+/5 knee extension --stable Skin: Skin is warm and dry. He is not diaphoretic.  Psychiatric: Pleasant and appropriate.    Assessment/Plan: 1. Functional deficits and tetraplegia secondary to incomplete C4 spinal cord injury which require 3+ hours per day of interdisciplinary therapy in a comprehensive  inpatient rehab setting. Physiatrist is providing close team supervision and 24 hour management of active medical problems listed below. Physiatrist and rehab team continue to assess barriers to discharge/monitor patient progress toward functional and medical goals.  Function:  Bathing Bathing position Bathing activity did not occur: N/A(night bath) Position: Bed  Bathing parts Body parts bathed by patient: Right arm, Left arm, Right upper leg, Chest, Abdomen, Front perineal area, Left upper leg, Right lower leg, Left lower leg Body parts bathed by helper: Right arm, Left arm, Chest, Abdomen, Front perineal area, Buttocks, Right upper leg, Left upper leg, Right lower leg, Left lower leg, Back  Bathing assist Assist Level: 2 helpers      Upper Body Dressing/Undressing Upper body dressing Upper body dressing/undressing activity did not occur: (preferred to work on UE strength & use today rather than bathe and dress again.   Stated he did so modifiied bathing on saturday or sunday) What is the patient wearing?: Pull over shirt/dress     Pull over shirt/dress - Perfomed by patient: Thread/unthread right sleeve, Thread/unthread left sleeve, Pull shirt over trunk Pull over shirt/dress - Perfomed by helper: Put head through opening        Upper body assist Assist Level: Touching or steadying assistance(Pt > 75%)      Lower Body Dressing/Undressing Lower body dressing Lower body dressing/undressing activity did not occur: N/A What is the patient wearing?: Non-skid slipper socks, Pants       Pants- Performed  by helper: Thread/unthread right pants leg, Thread/unthread left pants leg, Pull pants up/down Non-skid slipper socks- Performed by patient: Don/doff right sock, Don/doff left sock Non-skid slipper socks- Performed by helper: Don/doff right sock, Don/doff left sock                  Lower body assist Assist for lower body dressing: 2 Helpers      Toileting Toileting  Toileting activity did not occur: No continent bowel/bladder event   Toileting steps completed by helper: Adjust clothing prior to toileting, Performs perineal hygiene, Adjust clothing after toileting Toileting Assistive Devices: Toilet aid  Toileting assist Assist level: Two helpers   Transfers Chair/bed transfer Chair/bed transfer activity did not occur: Safety/medical concerns Chair/bed transfer method: Other Chair/bed transfer assist level: dependent (Pt equals 0%) Chair/bed transfer assistive device: Mechanical lift Mechanical lift: Stedy   Locomotion Ambulation Ambulation activity did not occur: Safety/medical Investment banker, operational activity did not occur: Safety/medical concerns Type: Manual Max wheelchair distance: 50 Assist Level: Touching or steadying assistance (Pt > 75%)  Cognition Comprehension Comprehension assist level: Follows complex conversation/direction with no assist  Expression Expression assist level: Expresses complex ideas: With no assist  Social Interaction Social Interaction assist level: Interacts appropriately with others - No medications needed.  Problem Solving Problem solving assist level: Solves complex problems: Recognizes & self-corrects  Memory Memory assist level: Complete Independence: No helper  Medical Problem List and Plan:  1. Functional deficits secondary to C4 Spinal cord injury, motor and sensory incomplete   Continue CIR  -team conference 2. DVT/PE/Anticoagulation: Patient now on Xarelto.  Reviewed long term need for xarelto with patient 3. Pain Management: managed with prn medications.    -  gabapentin 100 mg 3 times daily for neuropathic pain with improved control 4. Mood: LCSW to follow for evaluation and support.  5. Neuropsych: This patient is capable of making decisions on his own behalf.  6. Skin/Wound Care: continue pressure relief measures, appropriate nutrition  Prevalon boots ordered 7.  Fluids/Electrolytes/Nutrition: encourage PO  BMP within acceptable range on 4/29 8. T2DM: Monitor BS ac/hs and use SSI for elevated BS.    Relatively controlled on 5/7 9. CAD: Managed with Lipitor, Plavix, and metoprolol  10. Morbid obesity: Body mass index is 51.76 kg/m.  Diet and exercise education ongoing   11. Combined systolic/diastolic CHF: Monitor weights daily. Continue Lasix, Plavix, statin and metoprolol.   Filed Weights   12/23/17 0428 12/24/17 0156  Weight: (!) 170.4 kg (375 lb 10.6 oz) (!) 170.3 kg (375 lb 7.1 oz)   Need daily weights 12. HTN: Monitor BP bid. Continue terazosin and furosemide.   Intermittent elevation.   -discussed with patient and I prefer to avoid overtreating at this point. Continue to follow closely. If further increase, we'll adjust regimen 13. H/o BPH: Reports voiding without difficulty--on Hytrin.  14. Bowel and bladder: appears to be continent    -  OOB to void.  His size remains an impediment 15. Seasonal allergies?   LOS (Days) 12 A FACE TO FACE EVALUATION WAS PERFORMED  Ranelle Oyster, MD 12/29/2017 8:45 AM

## 2017-12-29 NOTE — Progress Notes (Signed)
Physical Therapy Session Note  Patient Details  Name: Franklin Hicks MRN: [id number] Date of Birth: 08/16/1951  Today's Date: 12/29/2017 PT Individual Time: 1520-1559 PT Individual Time Calculation (min): 39 min   Short Term Goals: Week 1:  PT Short Term Goal 1 (Week 1): Pt will roll L to full side lying with use of bed features and +1 assist for pressure relief and to reduce burden of care for hygiene/dressing PT Short Term Goal 1 - Progress (Week 1): Not met PT Short Term Goal 2 (Week 1): Pt will verbalize 1 method of pressure relief in w/c with min verbal cues PT Short Term Goal 2 - Progress (Week 1): Not met PT Short Term Goal 3 (Week 1): Pt will demonstate lateral leans to elbow (R and L) and return to midline from EOB or therapy mat with min assist PT Short Term Goal 3 - Progress (Week 1): Not met PT Short Term Goal 4 (Week 1): Pt will tolerate out of bed x30 minutes outside of therapy sessions PT Short Term Goal 4 - Progress (Week 1): Met  Skilled Therapeutic Interventions/Progress Updates:  Pt received in w/c & agreeable to tx. Pt reports "moderate, usual" pain in BUE and reports being premedicated. Pt transported to gym via w/c total assist for time management. Set up pt to utilize maxisky and parallel bars for sit>stand transfers. Attempted sit>stand x 2 with therapist blocking B knees to prevent buckling and pt pulling up on parallel bars. Pt able to clear buttocks from w/c seat but unable to shift weight anteriorly to come to full upright standing. Pt propelled w/c back to room x 50 ft with BUE & min assist to maintain trajectory and prevent pt from steering L. At end of session pt left sitting in w/c with all needs within reach.  Therapy Documentation Precautions:  Precautions Precautions: Cervical Required Braces or Orthoses: Cervical Brace Cervical Brace: Hard collar Restrictions Weight Bearing Restrictions: No   See Function Navigator for Current Functional  Status.   Therapy/Group: Individual Therapy  Victoria M Miller 12/29/2017, 4:02 PM  

## 2017-12-29 NOTE — Progress Notes (Signed)
Occupational Therapy Session Note  Patient Details  Name: Franklin Hicks MRN: [id number] Date of Birth: June 14, 1951  Today's Date: 12/29/2017 OT Individual Time: 1100-1200 OT Individual Time Calculation (min): 60 min    Short Term Goals: Week 2:  OT Short Term Goal 1 (Week 2): Pt will be able to stand using a bariatric stedy lift with max A of 1. OT Short Term Goal 2 (Week 2): Pt will be able to tolerate standing in a stedy fully upright for at least 2 min. OT Short Term Goal 3 (Week 2): pt will have improved R shoulder AROM to reach to wash under his L arm. OT Short Term Goal 4 (Week 2): Pt will don pullover shirt with min A  Skilled Therapeutic Interventions/Progress Updates:    Pt sitting in w/c at sink upon arrival.  Pt completed bathing/dressing tasks in earlier session.  Kinesio Tape reapplied to B traps and rhomboids. Pt engaged in BUE/FMC tasks at table with pegs and graded clothes pins.  Pt continues to incorporate compensatory strategies/techniques using BUE for functional tasks.  Pt with improved use of B hands with functional tasks (R>L). Pt remained in w/c with all needs within reach.  Focus on BUE function to increase independence with BADLs.   Therapy Documentation Precautions:  Precautions Precautions: Cervical Required Braces or Orthoses: Cervical Brace Cervical Brace: Hard collar Restrictions Weight Bearing Restrictions: No Pain:  Pt denies pan    See Function Navigator for Current Functional Status.   Therapy/Group: Individual Therapy  Rich Brave 12/29/2017, 12:03 PM

## 2017-12-29 NOTE — Progress Notes (Signed)
Occupational Therapy Session Note  Patient Details  Name: Franklin Hicks MRN: [id number] Date of Birth: 02/26/1951  Today's Date: 12/29/2017 OT Individual Time: 1400-1430 OT Individual Time Calculation (min): 30 min    Short Term Goals: Week 2:  OT Short Term Goal 1 (Week 2): Pt will be able to stand using a bariatric stedy lift with max A of 1. OT Short Term Goal 2 (Week 2): Pt will be able to tolerate standing in a stedy fully upright for at least 2 min. OT Short Term Goal 3 (Week 2): pt will have improved R shoulder AROM to reach to wash under his L arm. OT Short Term Goal 4 (Week 2): Pt will don pullover shirt with min A  Skilled Therapeutic Interventions/Progress Updates:    Pt seen for OT session focusing on UE ROM and sit>stand for LE strengthening and building functional activity tolerance. Pt sitting up in w/c upon arrival, agreeable to tx session. In therapy gym, completed x10 modified overhead ball raises with B UEs on ball. Pt able to raise arms ~ 80 degrees shoulder flexion before incorporating compensatory movements. Education and demonstrates provided regarding compensatory strategies and pt completed x10 without compensatory movements, rest break required following activity.  Pt then completed x3 sit>stand in STEDY. Pt required min A to stand from w/c into STEADY with significantly increased time and effort, total reliance on B UEs in order to eventually self into partial stand cont with B UE support. Rest breaks provided btwn each trial and assist required for controlled descent and positioning back into w/c. Pt returned to room at end of session, left seated with all needs in reach.   Therapy Documentation Precautions:  Precautions Precautions: Cervical Required Braces or Orthoses: Cervical Brace Cervical Brace: Hard collar Restrictions Weight Bearing Restrictions: No Pain:   No/denies pain ADL: ADL ADL Comments: refer to functional navigator  See Function Navigator  for Current Functional Status.   Therapy/Group: Individual Therapy  [name] L 12/29/2017, 7:17 AM

## 2017-12-30 ENCOUNTER — Inpatient Hospital Stay (HOSPITAL_COMMUNITY): Payer: Medicare Other | Admitting: Physical Therapy

## 2017-12-30 ENCOUNTER — Inpatient Hospital Stay (HOSPITAL_COMMUNITY): Payer: Medicare Other | Admitting: Occupational Therapy

## 2017-12-30 LAB — GLUCOSE, CAPILLARY
GLUCOSE-CAPILLARY: 127 mg/dL — AB (ref 65–99)
GLUCOSE-CAPILLARY: 146 mg/dL — AB (ref 65–99)
Glucose-Capillary: 125 mg/dL — ABNORMAL HIGH (ref 65–99)
Glucose-Capillary: 115 mg/dL — ABNORMAL HIGH (ref 65–99)

## 2017-12-30 NOTE — Progress Notes (Signed)
Physical Therapy Session Note  Patient Details  Name: Franklin Hicks MRN: [id number] Date of Birth: 1950/11/15  Today's Date: 12/30/2017 PT Individual Time: 1130-1200 and  1300-1415 PT Individual Time Calculation (min): 30 min and 75 min   Short Term Goals: Week 2:  PT Short Term Goal 1 (Week 2): Pt will roll in bed with +1 assist and use of bed features for pressure relief and to reduce burden of care for hygiene and dressing.  PT Short Term Goal 2 (Week 2): Pt will use UEs to facilitate upright posture in lift equipment for up to 1 minute at a time for UE/trunk strengthening and improved postural control PT Short Term Goal 3 (Week 2): Pt will demonstrate lateral leans to R/L elbow to reduce burden of care for ADLs from EOB or in w/c.   PT Short Term Goal 4 (Week 2): Pt will initiate w/c propulsion for UE strengthening and cardiovascular endurance.   Skilled Therapeutic Interventions/Progress Updates:    Session 1: reports pain in hands is much improved.  Session focus on w/c mobility and LE therex for strengthening and cardiovascular exercise.  PT applied theraband to R drive wheel for improved grip/propulsion.  Pt requires increased time to propel w/c with BUEs from gym to room, with verbal cues for increased stroke length and decreased compensation with torso.  LE therex 2x12 reps hip flexion and LAQ focus on controlled descent bilaterally.  Pt left upright in w/c with call bell in reach and needs met.   Session 2: no c/o pain.  Session focus on NMR for muscle endurance and postural control in standing.  Utilized maxi-sky and // bars to increase pt confidence and safety.  Sit<>stand x5 from elevated mat with heavy reliance on UEs pulling for first 3 trials, and cues for pushing through UEs on second 2 trials.  Pt able to tolerate 30-120 seconds of standing throughout session, no knee buckling noted on R or L.  PT instructed pt in slide board transfer mat<>w/c, pt requiring assist to place board, but  only min guard to complete transfer with verbal cues for head/hips relationship, forward weight shift, and use of LEs to assist with boosting.  Pt positioned on EOB at end of session (with stedy for transfer) with call bell in reach and needs met.   Therapy Documentation Precautions:  Precautions Precautions: Cervical Required Braces or Orthoses: Cervical Brace Cervical Brace: Hard collar Restrictions Weight Bearing Restrictions: No   See Function Navigator for Current Functional Status.   Therapy/Group: Individual Therapy  Michel Santee 12/30/2017, 7:13 PM

## 2017-12-30 NOTE — Progress Notes (Signed)
[facility] PHYSICAL MEDICINE & REHABILITATION     PROGRESS NOTE    Subjective/Complaints: Had an uneventful night.   ROS: Patient denies fever, rash, sore throat, blurred vision, nausea, vomiting, diarrhea, cough, shortness of breath or chest pain, joint or back pain, headache, or mood change.    Objective: Vital Signs: Blood pressure (!) 146/74, pulse 89, temperature 98.4 F (36.9 C), temperature source Oral, resp. rate 17, height 6' (1.829 m), weight (!) 160.1 kg (352 lb 15.3 oz), SpO2 97 %. No results found. No results for input(s): WBC, HGB, HCT, PLT in the last 72 hours. Recent Labs    12/28/17 0625  NA 138  K 3.9  CL 104  GLUCOSE 118*  BUN 21*  CREATININE 1.09  CALCIUM 8.5*   CBG (last 3)  Recent Labs    12/29/17 1649 12/29/17 2127 12/30/17 0702  GLUCAP 118* 120* 125*    Wt Readings from Last 3 Encounters:  12/30/17 (!) 160.1 kg (352 lb 15.3 oz)  12/15/17 (!) 173.1 kg (381 lb 9.9 oz)  09/24/17 (!) 160 kg (352 lb 12.8 oz)    Physical Exam:  Constitutional: No distress . Vital signs reviewed. HEENT: EOMI, oral membranes moist Neck: supple Cardiovascular: RRR without murmur. No JVD    Respiratory: CTA Bilaterally without wheezes or rales. Normal effort    GI: BS +, non-tender, non-distended  Musculoskeletal: He exhibits bilateral lower extremity edema. Left heel TTP. Neurological: He is alert and oriented  Motor: LUE 3+ to 4- prox to 3/5 HI RUE 4-/5 proximal to distal.  LLE: 2-2+/5 with inhibition due to knee injury-stable RLE: 3/5 hip flexion, 3+/5 knee extension -stable Skin: Skin is warm and dry.    Psychiatric: Pleasant and appropriate.    Assessment/Plan: 1. Functional deficits and tetraplegia secondary to incomplete C4 spinal cord injury which require 3+ hours per day of interdisciplinary therapy in a comprehensive inpatient rehab setting. Physiatrist is providing close team supervision and 24 hour management of active medical problems listed  below. Physiatrist and rehab team continue to assess barriers to discharge/monitor patient progress toward functional and medical goals.  Function:  Bathing Bathing position Bathing activity did not occur: N/A(night bath) Position: Bed  Bathing parts Body parts bathed by patient: Front perineal area Body parts bathed by helper: Right arm, Left arm, Chest, Abdomen, Front perineal area, Buttocks, Right upper leg, Left upper leg, Right lower leg, Left lower leg, Back  Bathing assist Assist Level: 2 helpers      Upper Body Dressing/Undressing Upper body dressing Upper body dressing/undressing activity did not occur: (preferred to work on UE strength & use today rather than bathe and dress again.   Stated he did so modifiied bathing on saturday or sunday) What is the patient wearing?: Pull over shirt/dress     Pull over shirt/dress - Perfomed by patient: Thread/unthread right sleeve, Thread/unthread left sleeve, Pull shirt over trunk Pull over shirt/dress - Perfomed by helper: Put head through opening        Upper body assist Assist Level: Touching or steadying assistance(Pt > 75%)      Lower Body Dressing/Undressing Lower body dressing Lower body dressing/undressing activity did not occur: N/A What is the patient wearing?: Pants       Pants- Performed by helper: Thread/unthread right pants leg, Thread/unthread left pants leg, Pull pants up/down Non-skid slipper socks- Performed by patient: Don/doff right sock, Don/doff left sock Non-skid slipper socks- Performed by helper: Don/doff right sock, Don/doff left sock  Lower body assist Assist for lower body dressing: 2 Helpers      Toileting Toileting Toileting activity did not occur: No continent bowel/bladder event   Toileting steps completed by helper: Adjust clothing prior to toileting, Performs perineal hygiene, Adjust clothing after toileting    Toileting assist Assist level: Two helpers    Transfers Chair/bed transfer Chair/bed transfer activity did not occur: Safety/medical concerns Chair/bed transfer method: (steady) Chair/bed transfer assist level: dependent (Pt equals 0%) Chair/bed transfer assistive device: Mechanical lift(steady) Mechanical lift: Stedy   Locomotion Ambulation Ambulation activity did not occur: Safety/medical Investment banker, operational activity did not occur: Safety/medical concerns Type: Manual Max wheelchair distance: 50 ft Assist Level: Touching or steadying assistance (Pt > 75%)  Cognition Comprehension Comprehension assist level: Follows complex conversation/direction with no assist  Expression Expression assist level: Expresses complex ideas: With no assist  Social Interaction Social Interaction assist level: Interacts appropriately with others - No medications needed.  Problem Solving Problem solving assist level: Solves complex problems: Recognizes & self-corrects  Memory Memory assist level: Complete Independence: No helper  Medical Problem List and Plan:  1. Functional deficits secondary to C4 Spinal cord injury, motor and sensory incomplete   Continue CIR  -making gradual gains. Weight, left knee are obstacles 2. DVT/PE/Anticoagulation: Patient now on Xarelto.  Reviewed long term need for xarelto with patient 3. Pain Management: managed with prn medications.    -  gabapentin 100 mg 3 times daily for neuropathic pain with improved control 4. Mood: LCSW to follow for evaluation and support.  5. Neuropsych: This patient is capable of making decisions on his own behalf.  6. Skin/Wound Care: continue pressure relief measures, appropriate nutrition  Prevalon boots   7. Fluids/Electrolytes/Nutrition: encourage PO  BMP within acceptable range on 4/29 8. T2DM: Monitor BS ac/hs and use SSI for elevated BS.    Relatively controlled on 5/8 9. CAD: Managed with Lipitor, Plavix, and metoprolol  10. Morbid obesity: Body mass index  is 51.76 kg/m.   Diet and exercise education ongoing   -has lost weight but is eating well. 11. Combined systolic/diastolic CHF: Monitor weights daily. Continue Lasix, Plavix, statin and metoprolol.   Filed Weights   12/24/17 0156 12/30/17 0557  Weight: (!) 170.3 kg (375 lb 7.1 oz) (!) 160.1 kg (352 lb 15.3 oz)   Weight down. I/o's negative 12. HTN: Monitor BP bid. Continue terazosin and furosemide.   Intermittent elevation.   -discussed with patient and I prefer to avoid overtreating at this point. Continue to follow closely. If further increase, we'll adjust regimen 13. H/o BPH: Reports voiding without difficulty--on Hytrin.  14. Bowel and bladder: appears to be continent    -  OOB to void as much as possible 15. Seasonal allergies?   LOS (Days) 13 A FACE TO FACE EVALUATION WAS PERFORMED  Ranelle Oyster, MD 12/30/2017 8:51 AM

## 2017-12-30 NOTE — Patient Care Conference (Signed)
Inpatient RehabilitationTeam Conference and Plan of Care Update Date: 12/29/2017   Time: 2:15 PM    Patient Name: Franklin Hicks      Medical Record Number: [id number]  Date of Birth: [birth year] Sex: Male         Room/Bed: 4W16C/4W16C-01 Payor Info: Payor: Advertising copywriter MEDICARE / Plan: [employer] MEDICARE / Product Type: *No Product type* /    Admitting Diagnosis: SCI  Admit Date/Time:  12/17/2017  5:28 PM Admission Comments: No comment available   Primary Diagnosis:  C4 spinal cord injury, sequela (HCC) Principal Problem: C4 spinal cord injury, sequela [facility])  Patient Active Problem List   Diagnosis Date Noted  . Pain of left heel   . Diabetes mellitus type 2 in obese (HCC)   . C4 spinal cord injury, sequela (HCC) 12/18/2017  . Tetraplegia (HCC) 12/18/2017  . Acute pulmonary embolism (HCC) 12/18/2017  . Aortic aneurysm (HCC) 12/16/2017  . Fall 12/13/2017  . Cord compression (HCC) 12/13/2017  . Abnormal CT scan, cervical spine 12/13/2017  . Chronic combined systolic and diastolic heart failure (HCC) 10/09/2016  . CAD S/P percutaneous coronary angioplasty 09/20/2016  . H/O non-ST elevation myocardial infarction (NSTEMI) 09/20/2016  . Erectile dysfunction 07/30/2015  . Morbid obesity with BMI of 50.0-59.9, adult (HCC) 07/18/2013  . History of pulmonary embolism 07/13/2013  . Angioedema of lips 07/13/2013  . Multinodular goiter 07/13/2013  . Dyslipidemia, goal LDL below 70 10/02/2011  . SOB (shortness of breath) 10/02/2011  . BPH with urinary obstruction 07/17/2010  . Diabetes mellitus type 2, controlled (HCC) 07/17/2010  . Bilateral lower extremity edema 12/08/2008  . NEUROPATHY, IDIOPATHIC PERIPHERAL NEC 05/31/2007  . Osteoarthritis 03/22/2007  . Gout 03/15/2007  . Essential hypertension 03/15/2007    Expected Discharge Date: Expected Discharge Date: 01/13/18  Team Members Present: Physician leading conference: Dr. Faith Rogue Social Worker Present: Amada Jupiter, LCSW Nurse  Present: Kennyth Arnold, RN PT Present: Teodoro Kil, PT OT Present: Ardis Rowan, COTA;Jennifer Katrinka Blazing, OT PPS Coordinator present : Tora Duck, RN, CRRN     Current Status/Progress Goal Weekly Team Focus  Medical   gradual neurological progress. pain controlled. bp bordeline.   increase transfers, mobility  bp control, nutrition,    Bowel/Bladder   Continent of bowel/bladder. LBM 12/27/17  Remain continent of bowel/bladder with min assist  Assess bowel/bladder function q shift and as needed   Swallow/Nutrition/ Hydration             ADL's   bathing-max A/tot A; UB dressing-min A; LB dressing-max A (bed level and sitting EOB); rolling in bed-min A; supine>sit EOB-supervision; tranfsers with Stedy-max A+2 for sit<>stand  min A overall  sit<>stand,functional transfers, BUE AROM, activity tolerance   Mobility   supervision for supine>sit, supervision for sit<>stand in stedy, supervision w/c mobility (limited by weight and postioning in chair)  lofty supervision for transfers, may need to d/c gait   standing, w/c positioning, education on pressure relief and stretching, general strengthening and activity tolerance   Communication             Safety/Cognition/ Behavioral Observations            Pain   Patient denied pain  <2  Assess and treat pain q shift and as needed   Skin   Bilateral lower extremities edema   Skin to free of infection/breakdown with min assist  Assess skin q shift and as needed    Rehab Goals Patient on target to meet rehab goals: Yes *See Care  Plan and progress notes for long and short-term goals.     Barriers to Discharge  Current Status/Progress Possible Resolutions Date Resolved   Physician    Medical stability;Weight        continue medical plan as outlined in chart      Nursing                  PT                    OT                  SLP                SW                Discharge Planning/Teaching Needs:  Home with wife and other  family members providing 24/7 assistance.   Education to be completed closer to d/c.   Team Discussion:  Not much neurological change;  Knee with very poor ROM.  UE's stronger;  Cont b/b but still on bed pan and need to progress to using bsc.  Refuses q2 hr turn at night.  Recommend fam to build ramp into back door and concerned about access to bed and bath.  Supervision for sitting EOB;  Supervision to stand and relies heavily on arms.  Pt aware he is not expected to be ambulating at d/c.  Revisions to Treatment Plan:  None    Continued Need for Acute Rehabilitation Level of Care: The patient requires daily medical management by a physician with specialized training in physical medicine and rehabilitation for the following conditions: Daily direction of a multidisciplinary physical rehabilitation program to ensure safe treatment while eliciting the highest outcome that is of practical value to the patient.: Yes Daily medical management of patient stability for increased activity during participation in an intensive rehabilitation regime.: Yes Daily analysis of laboratory values and/or radiology reports with any subsequent need for medication adjustment of medical intervention for : Neurological problems;Post surgical problems  Kyrene Longan 12/30/2017, 1:36 PM

## 2017-12-30 NOTE — Progress Notes (Signed)
Occupational Therapy Session Note  Patient Details  Name: Franklin Hicks MRN: [id number] Date of Birth: August 15, 1951  Today's Date: 12/30/2017 OT Individual Time: 0930-1100 OT Individual Time Calculation (min): 90 min    Short Term Goals: Week 2:  OT Short Term Goal 1 (Week 2): Pt will be able to stand using a bariatric stedy lift with max A of 1. OT Short Term Goal 2 (Week 2): Pt will be able to tolerate standing in a stedy fully upright for at least 2 min. OT Short Term Goal 3 (Week 2): pt will have improved R shoulder AROM to reach to wash under his L arm. OT Short Term Goal 4 (Week 2): Pt will don pullover shirt with min A  Skilled Therapeutic Interventions/Progress Updates:    OT intervention with focus on bathing at shower level and dressing with sit<>stand (STedy) from w/c.  Pt performed sit<>stand with Stedy (max A) for transport to rolling shower chair.  Pt required assistance bathing BLE, buttocks, and washing hair but was able to complete all other bathing tasks without assistance.  Pt used reacher to thread BLE into pants but required assistance pulling up pants when standing in [place].  Pt attempted to pull up pants but required assistance to complete task.  Pt completed grooming tasks at sink without assistance.  Pt remained in w/c with all needs within reach.   Therapy Documentation Precautions:  Precautions Precautions: Cervical Required Braces or Orthoses: Cervical Brace Cervical Brace: Hard collar Restrictions Weight Bearing Restrictions: No Pain:  Pt denies pain  See Function Navigator for Current Functional Status.   Therapy/Group: Individual Therapy  Rich Brave 12/30/2017, 3:22 PM

## 2017-12-31 ENCOUNTER — Inpatient Hospital Stay (HOSPITAL_COMMUNITY): Payer: Medicare Other | Admitting: Physical Therapy

## 2017-12-31 ENCOUNTER — Inpatient Hospital Stay (HOSPITAL_COMMUNITY): Payer: Medicare Other

## 2017-12-31 LAB — GLUCOSE, CAPILLARY
GLUCOSE-CAPILLARY: 128 mg/dL — AB (ref 65–99)
Glucose-Capillary: 113 mg/dL — ABNORMAL HIGH (ref 65–99)
Glucose-Capillary: 115 mg/dL — ABNORMAL HIGH (ref 65–99)

## 2017-12-31 NOTE — Progress Notes (Signed)
[place] PHYSICAL MEDICINE & REHABILITATION     PROGRESS NOTE    Subjective/Complaints: Stood in therapies yesterday. No new complaints.   ROS: Patient denies fever, rash, sore throat, blurred vision, nausea, vomiting, diarrhea, cough, shortness of breath or chest pain, joint or back pain, headache, or mood change.    Objective: Vital Signs: Blood pressure 135/70, pulse 75, temperature 98.2 F (36.8 C), temperature source Oral, resp. rate 18, height 6' (1.829 m), weight (!) 160.1 kg (352 lb 15.3 oz), SpO2 90 %. No results found. No results for input(s): WBC, HGB, HCT, PLT in the last 72 hours. No results for input(s): NA, K, CL, GLUCOSE, BUN, CREATININE, CALCIUM in the last 72 hours.  Invalid input(s): CO CBG (last 3)  Recent Labs    12/30/17 1647 12/30/17 2122 12/31/17 0658  GLUCAP 127* 146* 113*    Wt Readings from Last 3 Encounters:  12/30/17 (!) 160.1 kg (352 lb 15.3 oz)  12/15/17 (!) 173.1 kg (381 lb 9.9 oz)  09/24/17 (!) 160 kg (352 lb 12.8 oz)    Physical Exam:  Constitutional: No distress . Vital signs reviewed. HEENT: EOMI, oral membranes moist Neck: supple Cardiovascular: RRR without murmur. No JVD    Respiratory: CTA Bilaterally without wheezes or rales. Normal effort    GI: BS +, non-tender, non-distended  Musculoskeletal: He exhibits bilateral lower extremity edema. Left heel TTP. Neurological: He is alert and oriented  Motor: LUE 3+ to 4- prox to 3/5 HI RUE 4-/5 proximal to distal.  LLE: 2-2+/5 prox to 3/5 distally RLE: 3/5 hip flexion, 3+/5 knee extension -stable Skin: Skin is warm and dry.    Psychiatric: Pleasant and appropriate.    Assessment/Plan: 1. Functional deficits and tetraplegia secondary to incomplete C4 spinal cord injury which require 3+ hours per day of interdisciplinary therapy in a comprehensive inpatient rehab setting. Physiatrist is providing close team supervision and 24 hour management of active medical problems listed  below. Physiatrist and rehab team continue to assess barriers to discharge/monitor patient progress toward functional and medical goals.  Function:  Bathing Bathing position Bathing activity did not occur: N/A(night bath) Position: Shower  Bathing parts Body parts bathed by patient: Right arm, Left arm, Chest, Abdomen, Front perineal area, Right upper leg, Left upper leg Body parts bathed by helper: Buttocks, Left lower leg, Right lower leg  Bathing assist Assist Level: 2 helpers      Upper Body Dressing/Undressing Upper body dressing Upper body dressing/undressing activity did not occur: (preferred to work on UE strength & use today rather than bathe and dress again.   Stated he did so modifiied bathing on saturday or sunday) What is the patient wearing?: Pull over shirt/dress     Pull over shirt/dress - Perfomed by patient: Thread/unthread right sleeve, Thread/unthread left sleeve, Pull shirt over trunk Pull over shirt/dress - Perfomed by helper: Put head through opening        Upper body assist Assist Level: Touching or steadying assistance(Pt > 75%)      Lower Body Dressing/Undressing Lower body dressing Lower body dressing/undressing activity did not occur: N/A What is the patient wearing?: Pants, Shoes     Pants- Performed by patient: Thread/unthread right pants leg, Thread/unthread left pants leg Pants- Performed by helper: Pull pants up/down Non-skid slipper socks- Performed by patient: Don/doff right sock, Don/doff left sock Non-skid slipper socks- Performed by helper: Don/doff right sock, Don/doff left sock       Shoes - Performed by helper: Don/doff right shoe, Don/doff  left shoe          Lower body assist Assist for lower body dressing: 2 Helpers      Toileting Toileting Toileting activity did not occur: No continent bowel/bladder event   Toileting steps completed by helper: Adjust clothing prior to toileting, Performs perineal hygiene, Adjust clothing  after toileting    Toileting assist Assist level: Two helpers   Transfers Chair/bed transfer Chair/bed transfer activity did not occur: Safety/medical concerns Chair/bed transfer method: (steady) Chair/bed transfer assist level: dependent (Pt equals 0%) Chair/bed transfer assistive device: Mechanical lift(steady) Mechanical lift: Stedy   Locomotion Ambulation Ambulation activity did not occur: Safety/medical Investment banker, operational activity did not occur: Safety/medical concerns Type: Manual Max wheelchair distance: 50 ft Assist Level: Touching or steadying assistance (Pt > 75%)  Cognition Comprehension Comprehension assist level: Follows complex conversation/direction with no assist  Expression Expression assist level: Expresses complex ideas: With no assist  Social Interaction Social Interaction assist level: Interacts appropriately with others - No medications needed.  Problem Solving Problem solving assist level: Solves complex problems: Recognizes & self-corrects  Memory Memory assist level: Complete Independence: No helper  Medical Problem List and Plan:  1. Functional deficits secondary to C4 Spinal cord injury, motor and sensory incomplete   Continue CIR  -making gradual gains. Weight, left knee remain obstacles 2. DVT/PE/Anticoagulation: Patient now on Xarelto.  Reviewed long term need for xarelto with patient 3. Pain Management: managed with prn medications.    -  gabapentin 100 mg 3 times daily for neuropathic pain   4. Mood: LCSW to follow for evaluation and support.  5. Neuropsych: This patient is capable of making decisions on his own behalf.  6. Skin/Wound Care: continue pressure relief measures, appropriate nutrition  Prevalon boots   7. Fluids/Electrolytes/Nutrition: encourage PO  BMP within acceptable range on 4/29 8. T2DM: Monitor BS ac/hs and use SSI for elevated BS.    Relatively controlled on 5/9 9. CAD: Managed with Lipitor, Plavix, and  metoprolol  10. Morbid obesity: Body mass index is 51.76 kg/m.   Diet and exercise education ongoing   -has lost weight but is eating well. 11. Combined systolic/diastolic CHF: Monitor weights daily. Continue Lasix, Plavix, statin and metoprolol.   Filed Weights   12/24/17 0156 12/30/17 0557  Weight: (!) 170.3 kg (375 lb 7.1 oz) (!) 160.1 kg (352 lb 15.3 oz)   Weight down. I/o's negative  -doubt reliability of recorded weight 12. HTN: Monitor BP bid. Continue terazosin and furosemide.   Intermittent elevation.   -discussed with patient and I prefer to avoid overtreating at this point. Continue to follow closely. If further increase, we'll adjust regimen 13. H/o BPH: Reports voiding without difficulty--on Hytrin.  14. Bowel and bladder: appears to be continent    -  OOB to void as much as possible 15. Seasonal allergies?   LOS (Days) 14 A FACE TO FACE EVALUATION WAS PERFORMED  Ranelle Oyster, MD 12/31/2017 11:26 AM

## 2017-12-31 NOTE — Progress Notes (Signed)
Physical Therapy Session Note  Patient Details  Name: Franklin Hicks MRN: [id number] Date of Birth: 01/09/1951  Today's Date: 12/31/2017 PT Individual Time: 2355-7322 PT Individual Time Calculation (min): 75 min   Short Term Goals: Week 2:  PT Short Term Goal 1 (Week 2): Pt will roll in bed with +1 assist and use of bed features for pressure relief and to reduce burden of care for hygiene and dressing.  PT Short Term Goal 2 (Week 2): Pt will use UEs to facilitate upright posture in lift equipment for up to 1 minute at a time for UE/trunk strengthening and improved postural control PT Short Term Goal 3 (Week 2): Pt will demonstrate lateral leans to R/L elbow to reduce burden of care for ADLs from EOB or in w/c.   PT Short Term Goal 4 (Week 2): Pt will initiate w/c propulsion for UE strengthening and cardiovascular endurance.   Skilled Therapeutic Interventions/Progress Updates:    no c/o pain at rest, but does endorse R and L knee pain with repeated sit<>stand.  Session focus on activity tolerance and LE strengthening with sit<>stand from therapy mat with RW and transfers with slide board.    Pt completes slide board transfer to therapy mat from w/c with assist to position board, and min guard with verbal cues for forward weight shift.  Sit<>stand x5 from edge of therapy mat with RW, overall supervision with therapist stabilizing walker.  Pt continues to stand with split stance, leading with LLE due to decreased ROM and unable this session to bring feet more in line 2/2 fatigue.  PT applied kinesiotape to L knee with lateral support strips and decompression strip along the joint line for pain control with positive effects.  Educated on use of kinesiotape and signs of intolerance and pt verbalized understanding.  Returned to w/c with slide board in same manner as above requires some assist to scoot back in chair.  Left upright in w/c with call bell in reach and needs met.   Therapy  Documentation Precautions:  Precautions Precautions: Cervical Required Braces or Orthoses: Cervical Brace Cervical Brace: Hard collar Restrictions Weight Bearing Restrictions: No   See Function Navigator for Current Functional Status.   Therapy/Group: Individual Therapy  Michel Santee 12/31/2017, 4:33 PM

## 2017-12-31 NOTE — Progress Notes (Signed)
Occupational Therapy Session Note  Patient Details  Name: Franklin Hicks MRN: [id number] Date of Birth: Jul 25, 1951  Today's Date: 12/31/2017 OT Individual Time: 2194-7125 OT Individual Time Calculation (min): 26 min    Short Term Goals: Week 2:  OT Short Term Goal 1 (Week 2): Pt will be able to stand using a bariatric stedy lift with max A of 1. OT Short Term Goal 2 (Week 2): Pt will be able to tolerate standing in a stedy fully upright for at least 2 min. OT Short Term Goal 3 (Week 2): pt will have improved R shoulder AROM to reach to wash under his L arm. OT Short Term Goal 4 (Week 2): Pt will don pullover shirt with min A  Skilled Therapeutic Interventions/Progress Updates:    Session focused on B UE functional reaching and B UE strength/endurance in prep for ADL/IADL participation. Pt received in w/c with a lot of family present and therefore requesting to not leave room. 2 1lb wrist weights were donned to pt's wrists to challenge B UE strength. Pt performed 2 sets of 20 each functional reaching activities, crossing midline, in the frontal plane, and in the transverse plane with vc for form and encouragement provided throughout. Pt then completed [facility] training with different resistance clothespins, requiring min stabilization for L UE and intermittent CGA for R UE. Tactile/verbal cues provided throughout for proper body mechanics and to correct motor patterns. Pt left in w/c with all needs met.   Therapy Documentation Precautions:  Precautions Precautions: Cervical Required Braces or Orthoses: Cervical Brace Cervical Brace: Hard collar Restrictions Weight Bearing Restrictions: No   Vital Signs: Therapy Vitals Temp: 97.8 F (36.6 C) Temp Source: Oral Pulse Rate: 82 BP: 111/76 Patient Position (if appropriate): Sitting Oxygen Therapy SpO2: 100 % O2 Device: Room Air Pain: Pain Assessment Pain Scale: 0-10 Pain Score: 0-No pain ADL: ADL ADL Comments: refer to functional  navigator  See Function Navigator for Current Functional Status.   Therapy/Group: Individual Therapy  Curtis Sites 12/31/2017, 5:22 PM

## 2017-12-31 NOTE — Progress Notes (Signed)
Physical Therapy Session Note  Patient Details  Name: Franklin Hicks MRN: [id number] Date of Birth: 05/14/1951  Today's Date: 12/31/2017 PT Individual Time: 1115-1200 PT Individual Time Calculation (min): 45 min   Short Term Goals: Week 2:  PT Short Term Goal 1 (Week 2): Pt will roll in bed with +1 assist and use of bed features for pressure relief and to reduce burden of care for hygiene and dressing.  PT Short Term Goal 2 (Week 2): Pt will use UEs to facilitate upright posture in lift equipment for up to 1 minute at a time for UE/trunk strengthening and improved postural control PT Short Term Goal 3 (Week 2): Pt will demonstrate lateral leans to R/L elbow to reduce burden of care for ADLs from EOB or in w/c.   PT Short Term Goal 4 (Week 2): Pt will initiate w/c propulsion for UE strengthening and cardiovascular endurance.   Skilled Therapeutic Interventions/Progress Updates:    Session focused on sit <> stands for functional strengthening and progression of transfers from w/c with RW. Pt requires overall min assist for stabilization of RW and slight facilitation at pelvis for upright positioning but overall able to complete with extra time in very forward flexed position. Able to maintain a few seconds before requiring return to seated position but on second attempt in standing, pt able to weightshift and adjust positioning of RLE for more equal stance (pt tendency for L foot to be more in front than R in semi tandem due to chronic ROM issues with LLE). Instructed in importance of pressure relief technique during upright time in w/c and demonstrated w/c push up - pt unable to fully clear bottom but worked on starting this movement as well for an exercise in UE strengthening and repositioning in w/c for adjustment. Seated LE therex with green resistive theraband for LAQ, resisted knee flexion, resisted hip abduction, resisted PF, DF, and isometric hip adduction x 10 reps each BLE. Pt request to use  urinal and performed with set up assist and then performed hand and oral hygiene at sink at end of session to prepare for meeting at lunch.   Therapy Documentation Precautions:  Precautions Precautions: Cervical Required Braces or Orthoses: Cervical Brace Cervical Brace: Hard collar Restrictions Weight Bearing Restrictions: No   Pain:  No c/o pain. Some soreness in knee ongoing.  See Function Navigator for Current Functional Status.   Therapy/Group: Individual Therapy  Karolee Stamps Darrol Poke, PT, DPT  12/31/2017, 12:04 PM

## 2018-01-01 ENCOUNTER — Inpatient Hospital Stay (HOSPITAL_COMMUNITY): Payer: Medicare Other | Admitting: Physical Therapy

## 2018-01-01 ENCOUNTER — Inpatient Hospital Stay (HOSPITAL_COMMUNITY): Payer: Medicare Other

## 2018-01-01 LAB — GLUCOSE, CAPILLARY
GLUCOSE-CAPILLARY: 116 mg/dL — AB (ref 65–99)
GLUCOSE-CAPILLARY: 95 mg/dL (ref 65–99)
Glucose-Capillary: 126 mg/dL — ABNORMAL HIGH (ref 65–99)
Glucose-Capillary: 124 mg/dL — ABNORMAL HIGH (ref 65–99)

## 2018-01-01 NOTE — Progress Notes (Signed)
Occupational Therapy Note  Patient Details  Name: Franklin Hicks MRN: [id number] Date of Birth: 1950/12/26  Today's Date: 01/01/2018 OT Individual Time: 1330-1430 OT Individual Time Calculation (min): 60 min   Pt denies pain Individual Therapy  OT intervention with focus on BUE AROM and strengthening.  Pt engaged in variety of table tasks including assembling/disassembling pipe tree structure and dynavision activities with focus on B shoulder AROM.  Pt continues to exhibit compensatory techniques secondary to limited shoulder AROM/strength (RUE>LUE). Pt also engaged in BUE therex on [place]-5 mins X 2 with workload 5 to increase shoulder AROM/strength. Pt returned to room and remained in w/c with all needs within reach.    Lavone Neri [facility] 01/01/2018, 3:00 PM

## 2018-01-01 NOTE — Progress Notes (Signed)
Physical Therapy Weekly Progress Note  Patient Details  Name: Franklin Hicks MRN: [id number] Date of Birth: [birth year]  Beginning of progress report period: Dec 25, 2017 End of progress report period: Jan 01, 2018  Today's Date: 01/01/2018 PT Individual Time: 1530-1630 PT Individual Time Calculation (min): 60 min   Patient has met 4 of 4 short term goals.  Pt has made considerable progress this week and is able to transfer with a sliding board with min guard and max cues, and is able to perform sit<>stand transfers from a highly elevated surface with RW and heavy use of UEs.   Patient continues to demonstrate the following deficits muscle weakness, decreased cardiorespiratoy endurance, abnormal tone and unbalanced muscle activation and decreased standing balance, decreased postural control and decreased balance strategies and therefore will continue to benefit from skilled PT intervention to increase functional independence with mobility.  Patient progressing toward long term goals..  Continue plan of care.  PT Short Term Goals Week 2:  PT Short Term Goal 1 (Week 2): Pt will roll in bed with +1 assist and use of bed features for pressure relief and to reduce burden of care for hygiene and dressing.  PT Short Term Goal 1 - Progress (Week 2): Met PT Short Term Goal 2 (Week 2): Pt will use UEs to facilitate upright posture in lift equipment for up to 1 minute at a time for UE/trunk strengthening and improved postural control PT Short Term Goal 2 - Progress (Week 2): Met PT Short Term Goal 3 (Week 2): Pt will demonstrate lateral leans to R/L elbow to reduce burden of care for ADLs from EOB or in w/c.   PT Short Term Goal 3 - Progress (Week 2): Met PT Short Term Goal 4 (Week 2): Pt will initiate w/c propulsion for UE strengthening and cardiovascular endurance.  PT Short Term Goal 4 - Progress (Week 2): Met Week 3:  PT Short Term Goal 1 (Week 3): Pt will be able to shift weight in standing with LRAD  to unweight alternating LEs in preparation for stand/pivot transfers PT Short Term Goal 2 (Week 3): Pt will verbalize and demonstrate 2 methods of pressure relief in w/c.  PT Short Term Goal 3 (Week 3): Pt will demonstrate proper forward weight shift and head/hips relationship during slide board transfers with supervision for increased safety with transfers.   Skilled Therapeutic Interventions/Progress Updates:    no c/o pain at rest.  Does grimace and massage R and L knee throughout session but denies pain and states they feel "buckle-y".  Session focus on UE strengthening and activity tolerance, and transfers with slideboard/RW.    Pt propels w/c to therapy gym with BUEs and significantly increased amount of time.  Slide board transfer to therapy mat with therapist assist to position and stabilize board and pt completing transfer with supervision.  Attempted sit<>stand without success x2 from elevated mat, R knee buckling on both attempts.  Educated pt on importance of finding balance between rest and work and he verbalized understanding but education to continue.  Pt was able to stand 2 trials with heavy reliance on UEs and very forward flexed trunk, and therapist blocking R knee for safety.  Transition to semi-reclined on therapy mat with supervision and increased time.  Pt completes 10-12 reps hip flexion with knees flexed, and SLR.  Returned to sitting with min assist and pt completes 12 reps LAQ and tricep push ups for strengthening and practice with forward weight shift and push  through UEs/LEs needed for slide board transfer.  Attempted slideboard transfer slightly uphill back to w/c, but pt struggling with head/hips and use of UEs.  Once mat raised and transfer level, pt able to complete with supervision and mod cues for forward weight shift, hand positioning, and head/hips relationship.  Education to continue on head/hips throughout stay for maximum carryover.  Pt returned to room in w/c at end of  session and positioned upright with call bell in reach and needs met.   Therapy Documentation Precautions:  Precautions Precautions: Cervical Required Braces or Orthoses: Cervical Brace Cervical Brace: Hard collar Restrictions Weight Bearing Restrictions: No   See Function Navigator for Current Functional Status.  Therapy/Group: Individual Therapy  Michel Santee 01/01/2018, 4:49 PM

## 2018-01-01 NOTE — Progress Notes (Signed)
Occupational Therapy Session Note  Patient Details  Name: Franklin Hicks MRN: [id number] Date of Birth: [birth year]  Today's Date: 01/01/2018 OT Individual Time: 7829-5621 OT Individual Time Calculation (min): 75 min    Short Term Goals: Week 3:  OT Short Term Goal 1 (Week 3): Pt will pull up pants with mod A standing in bariatric Stedy OT Short Term Goal 2 (Week 3): Pt will be able to tolerate standing in a stedy fully upright for at least 2 min. OT Short Term Goal 3 (Week 3): Pt will complete LB bathing tasks seated in shower with min A  Skilled Therapeutic Interventions/Progress Updates:    Pt resting EOB upon arrival.  OT intervention with initial focus on UB bathing and UB/LB dressing with sit<>stand from EOB with RW.  Pt requires min A for UB dressing tasks.  Pt is able to thread BLE into pants with AE but requires assistance with pulling up pants.  Pt performs sit<>stand with RW from elevated surface with min A.  Pt performs sliding board transfer to w/c with steady A.  Pt directs care independently and appropriately.  Pt completed grooming tasks seated in w/c at sink.  Pt transitioned to day room and engaged in BUE table tasks with focus on increased grasp and BUE AROM with clothes pin tasks.  Pt returned to room and remained in w/c with all needs within reach.   Therapy Documentation Precautions:  Precautions Precautions: Cervical Required Braces or Orthoses: Cervical Brace Cervical Brace: Hard collar Restrictions Weight Bearing Restrictions: No  Pain:  Pt denies pain  See Function Navigator for Current Functional Status.   Therapy/Group: Individual Therapy  Rich Brave 01/01/2018, 12:52 PM

## 2018-01-01 NOTE — Progress Notes (Signed)
Social Work Patient ID: Franklin Hicks, male   DOB: 1951-08-20, 67 y.o.   MRN: [id number]  Met with pt yesterday to review team conference. He is aware that we continue to aim toward d/c of 5/22.  He is pleased with progress and aware team anticipates d/c home at w/c level.  Stressed to him that he needs to go ahead and start coordinating for ramp at home to determine if there any barriers to proposed Architect.  Asked that he make call over the weekend and I will follow up with him on Monday.  Continue to follow.  Teniya Filter, LCSW

## 2018-01-01 NOTE — Progress Notes (Signed)
Occupational Therapy Weekly Progress Note  Patient Details  Name: Franklin Hicks MRN: [id number] Date of Birth: April 26, 1951  Beginning of progress report period: Dec 24, 2017 End of progress report period: Jan 01, 2018  Patient has met 3 of 4 short term goals.  Pt is making steady progress with BADLs and functional transfers.  Pt completes bathing tasks with mod A without AE.  Pt requires min A for UB dressing tasks and mod A for LB dressing tasks with sit<>stand from elevated surface.  Pt requires min A for sit<>stand from elevated surface.  Pt performs sliding board transfers (bed<>w/c) with assist only to hold sliding board. Family has not been present during therapy.   Patient continues to demonstrate the following deficits: muscle weakness and muscle paralysis, decreased cardiorespiratoy endurance, unbalanced muscle activation and decreased coordination and decreased standing balance, decreased postural control and decreased balance strategies and therefore will continue to benefit from skilled OT intervention to enhance overall performance with BADL.  Patient progressing toward long term goals..  Continue plan of care.  OT Short Term Goals Week 2:  OT Short Term Goal 1 (Week 2): Pt will be able to stand using a bariatric stedy lift with max A of 1. OT Short Term Goal 1 - Progress (Week 2): Met OT Short Term Goal 2 (Week 2): Pt will be able to tolerate standing in a stedy fully upright for at least 2 min. OT Short Term Goal 2 - Progress (Week 2): Progressing toward goal OT Short Term Goal 3 (Week 2): pt will have improved R shoulder AROM to reach to wash under his L arm. OT Short Term Goal 3 - Progress (Week 2): Met OT Short Term Goal 4 (Week 2): Pt will don pullover shirt with min A OT Short Term Goal 4 - Progress (Week 2): Met Week 3:  OT Short Term Goal 1 (Week 3): Pt will pull up pants with mod A standing in bariatric Stedy OT Short Term Goal 2 (Week 3): Pt will be able to tolerate  standing in a stedy fully upright for at least 2 min. OT Short Term Goal 3 (Week 3): Pt will complete LB bathing tasks seated in shower with min A    Therapy Documentation Precautions:  Precautions Precautions: Cervical Required Braces or Orthoses: Cervical Brace Cervical Brace: Hard collar Restrictions Weight Bearing Restrictions: No  See Function Navigator for Current Functional Status.     Leotis Shames [facility] 01/01/2018, 6:56 AM

## 2018-01-01 NOTE — Progress Notes (Signed)
[place] PHYSICAL MEDICINE & REHABILITATION     PROGRESS NOTE    Subjective/Complaints: Just finished breakfast. No new complaints.  ROS: Patient denies fever, rash, sore throat, blurred vision, nausea, vomiting, diarrhea, cough, shortness of breath or chest pain, joint or back pain, headache, or mood change.   Objective: Vital Signs: Blood pressure (!) 153/73, pulse 72, temperature 98.3 F (36.8 C), temperature source Oral, resp. rate 18, height 6' (1.829 m), weight (!) 160 kg (352 lb 11.8 oz), SpO2 97 %. No results found. No results for input(s): WBC, HGB, HCT, PLT in the last 72 hours. No results for input(s): NA, K, CL, GLUCOSE, BUN, CREATININE, CALCIUM in the last 72 hours.  Invalid input(s): CO CBG (last 3)  Recent Labs    12/31/17 1656 12/31/17 2218 01/01/18 0703  GLUCAP 115* 128* 116*    Wt Readings from Last 3 Encounters:  01/01/18 (!) 160 kg (352 lb 11.8 oz)  12/15/17 (!) 173.1 kg (381 lb 9.9 oz)  09/24/17 (!) 160 kg (352 lb 12.8 oz)    Physical Exam:  Constitutional: No distress . Vital signs reviewed. HEENT: EOMI, oral membranes moist Neck: supple Cardiovascular: RRR without murmur. No JVD    Respiratory: CTA Bilaterally without wheezes or rales. Normal effort    GI: BS +, non-tender, non-distended   Musculoskeletal: left knee with KT Neurological: He is alert and oriented  Motor: LUE 3+ to 4- prox to 3/5 HI RUE 4-/5 proximal to distal.  LLE: 2-2+/5 prox to 3/5 distally RLE: 3/5 hip flexion, 3+/5 knee extension -stable Skin: Skin is warm and dry.    Psychiatric: pleasant and cooperative.    Assessment/Plan:  1. Functional deficits and tetraplegia secondary to incomplete C4 spinal cord injury which require 3+ hours per day of interdisciplinary therapy in a comprehensive inpatient rehab setting. Physiatrist is providing close team supervision and 24 hour management of active medical problems listed below. Physiatrist and rehab team continue to assess  barriers to discharge/monitor patient progress toward functional and medical goals.  Function:  Bathing Bathing position Bathing activity did not occur: N/A(night bath) Position: Shower  Bathing parts Body parts bathed by patient: Right arm, Left arm, Chest, Abdomen, Front perineal area, Right upper leg, Left upper leg Body parts bathed by helper: Buttocks, Left lower leg, Right lower leg  Bathing assist Assist Level: 2 helpers      Upper Body Dressing/Undressing Upper body dressing Upper body dressing/undressing activity did not occur: (preferred to work on UE strength & use today rather than bathe and dress again.   Stated he did so modifiied bathing on saturday or sunday) What is the patient wearing?: Pull over shirt/dress     Pull over shirt/dress - Perfomed by patient: Thread/unthread right sleeve, Thread/unthread left sleeve, Pull shirt over trunk Pull over shirt/dress - Perfomed by helper: Put head through opening        Upper body assist Assist Level: Touching or steadying assistance(Pt > 75%)      Lower Body Dressing/Undressing Lower body dressing Lower body dressing/undressing activity did not occur: N/A What is the patient wearing?: Pants, Shoes     Pants- Performed by patient: Thread/unthread right pants leg, Thread/unthread left pants leg Pants- Performed by helper: Pull pants up/down Non-skid slipper socks- Performed by patient: Don/doff right sock, Don/doff left sock Non-skid slipper socks- Performed by helper: Don/doff right sock, Don/doff left sock       Shoes - Performed by helper: Don/doff right shoe, Don/doff left shoe  Lower body assist Assist for lower body dressing: 2 Helpers      Toileting Toileting Toileting activity did not occur: No continent bowel/bladder event Toileting steps completed by patient: Adjust clothing after toileting, Adjust clothing prior to toileting Toileting steps completed by helper: Adjust clothing prior to  toileting, Performs perineal hygiene, Adjust clothing after toileting    Toileting assist Assist level: More than reasonable time, Set up/obtain supplies, Supervision or verbal cues   Transfers Chair/bed transfer Chair/bed transfer activity did not occur: Safety/medical concerns Chair/bed transfer method: Lateral scoot Chair/bed transfer assist level: Touching or steadying assistance (Pt > 75%) Chair/bed transfer assistive device: Sliding board Mechanical lift: Stedy   Locomotion Ambulation Ambulation activity did not occur: Safety/medical Investment banker, operational activity did not occur: Safety/medical concerns Type: Manual Max wheelchair distance: 50 ft Assist Level: Touching or steadying assistance (Pt > 75%)  Cognition Comprehension Comprehension assist level: Follows complex conversation/direction with no assist  Expression Expression assist level: Expresses complex ideas: With no assist  Social Interaction Social Interaction assist level: Interacts appropriately with others - No medications needed.  Problem Solving Problem solving assist level: Solves complex problems: Recognizes & self-corrects  Memory Memory assist level: Complete Independence: No helper  Medical Problem List and Plan:  1. Functional deficits secondary to C4 Spinal cord injury, motor and sensory incomplete   Continue CIR  -making gradual gains. Weight, left knee remain obstacles 2. DVT/PE/Anticoagulation: Patient now on Xarelto.  Reviewed long term need for xarelto with patient 3. Pain Management: managed with prn medications.    -  gabapentin 100 mg 3 times daily for neuropathic pain   4. Mood: LCSW to follow for evaluation and support.  5. Neuropsych: This patient is capable of making decisions on his own behalf.  6. Skin/Wound Care: continue pressure relief measures, appropriate nutrition  Prevalon boots   7. Fluids/Electrolytes/Nutrition: encourage PO  BMP within acceptable range on  4/29 8. T2DM: Monitor BS ac/hs and use SSI for elevated BS.    Relatively controlled on 5/9 9. CAD: Managed with Lipitor, Plavix, and metoprolol  10. Morbid obesity: Body mass index is 51.76 kg/m.   Diet and exercise education ongoing   -po intake good. 11. Combined systolic/diastolic CHF: Monitor weights daily. Continue Lasix, Plavix, statin and metoprolol.   Filed Weights   12/30/17 0557 01/01/18 0515  Weight: (!) 160.1 kg (352 lb 15.3 oz) (!) 160 kg (352 lb 11.8 oz)   Weight down. I/o's negative   12. HTN: Monitor BP bid. Continue terazosin and furosemide.   Intermittent elevation.   -reasonable control at present 5/10 13. H/o BPH: Reports voiding without difficulty--on Hytrin.  14. Bowel and bladder: appears to be continent    -  OOB to void as much as possible 15. Seasonal allergies?   LOS (Days) 15 A FACE TO FACE EVALUATION WAS PERFORMED  Ranelle Oyster, MD 01/01/2018 9:24 AM

## 2018-01-01 NOTE — Progress Notes (Signed)
Occupational Therapy Session Note  Patient Details  Name: SOMA LIZAK MRN: [id number] Date of Birth: 11-04-1950  Today's Date: 12/31/2017 OT Individual Time:  -  10:15-11:00   (late entry)   Short Term Goals: Week 1:  OT Short Term Goal 1 (Week 1): Pt will be able to stand using a bariatric stedy lift with max A of 1. OT Short Term Goal 1 - Progress (Week 1): Progressing toward goal OT Short Term Goal 2 (Week 1): Pt will be able to tolerate standing in a stedy fully upright for at least 2 min. OT Short Term Goal 2 - Progress (Week 1): Progressing toward goal OT Short Term Goal 3 (Week 1): pt will have improved R shoulder AROM to reach to wash under his L arm. OT Short Term Goal 3 - Progress (Week 1): Progressing toward goal OT Short Term Goal 4 (Week 1): Pt will have improved L shoulder AROM to be able to use L hand to pull shirt over R arm. OT Short Term Goal 4 - Progress (Week 1): Met OT Short Term Goal 5 (Week 1): Pt will be able to use R hand to use utensils with set up only. OT Short Term Goal 5 - Progress (Week 1): Met Week 2:  OT Short Term Goal 1 (Week 2): Pt will be able to stand using a bariatric stedy lift with max A of 1. OT Short Term Goal 1 - Progress (Week 2): Met OT Short Term Goal 2 (Week 2): Pt will be able to tolerate standing in a stedy fully upright for at least 2 min. OT Short Term Goal 2 - Progress (Week 2): Progressing toward goal OT Short Term Goal 3 (Week 2): pt will have improved R shoulder AROM to reach to wash under his L arm. OT Short Term Goal 3 - Progress (Week 2): Met OT Short Term Goal 4 (Week 2): Pt will don pullover shirt with min A OT Short Term Goal 4 - Progress (Week 2): Met  Skilled Therapeutic Interventions/Progress Updates:    1:1 Pt sitting on EOB when arrived. Discussed and practiced slide board transfer with min A from bed to w/c (even transfer). Then progressed to slide board transfer w/c to [facility] (also an even transfer with min A with a second  person just ensuring equipment didn't move. Pt able to progress to sit to stands with the RW with min A with +2 for safety since it was the first time transitioning to the RW. Pt uses both UEs over the hand rests of RW but able to transition to come into full standing position. Pt able to perform 3 sit to stands and even able to narrow his BOS once standing. Pt able to self propel himself from his room to the RN station. Performed 6 min on arm bike with one rest break at the 3 min mark. Returned to the room with call bell.   Therapy Documentation Precautions:  Precautions Precautions: Cervical Required Braces or Orthoses: Cervical Brace Cervical Brace: Hard collar Restrictions Weight Bearing Restrictions: No Pain: Pain Assessment Pain Scale: 0-10 Pain Score: 0-No pain ADL: ADL ADL Comments: refer to functional navigator Other Treatments:    See Function Navigator for Current Functional Status.   Therapy/Group: Individual Therapy  Willeen Cass [facility] 01/01/2018, 11:35 AM

## 2018-01-02 ENCOUNTER — Inpatient Hospital Stay (HOSPITAL_COMMUNITY): Payer: Medicare Other | Admitting: Physical Therapy

## 2018-01-02 LAB — GLUCOSE, CAPILLARY
GLUCOSE-CAPILLARY: 95 mg/dL (ref 65–99)
Glucose-Capillary: 122 mg/dL — ABNORMAL HIGH (ref 65–99)
Glucose-Capillary: 98 mg/dL (ref 65–99)

## 2018-01-02 NOTE — Progress Notes (Signed)
[place] PHYSICAL MEDICINE & REHABILITATION     PROGRESS NOTE    Subjective/Complaints: Patient lying in bed.  No new issues.  States he was standing longer in therapy yesterday.  Seem to transfer a bit easier  ROS: Patient denies fever, rash, sore throat, blurred vision, nausea, vomiting, diarrhea, cough, shortness of breath or chest pain, joint or back pain, headache, or mood change.    Objective: Vital Signs: Blood pressure 137/71, pulse 78, temperature 98.8 F (37.1 C), temperature source Oral, resp. rate 16, height 6' (1.829 m), weight (!) 160 kg (352 lb 11.8 oz), SpO2 98 %. No results found. No results for input(s): WBC, HGB, HCT, PLT in the last 72 hours. No results for input(s): NA, K, CL, GLUCOSE, BUN, CREATININE, CALCIUM in the last 72 hours.  Invalid input(s): CO CBG (last 3)  Recent Labs    01/01/18 1136 01/01/18 1635 01/01/18 2116  GLUCAP 126* 95 124*    Wt Readings from Last 3 Encounters:  01/01/18 (!) 160 kg (352 lb 11.8 oz)  12/15/17 (!) 173.1 kg (381 lb 9.9 oz)  09/24/17 (!) 160 kg (352 lb 12.8 oz)    Physical Exam:  Constitutional: No distress . Vital signs reviewed. obese HEENT: EOMI, oral membranes moist Neck: supple Cardiovascular: RRR without murmur. No JVD    Respiratory: CTA Bilaterally without wheezes or rales. Normal effort    GI: BS +, non-tender, non-distended    Musculoskeletal: left knee with KT Neurological: He is alert and oriented  Motor: LUE 3+ to 4- prox to 3/5 HI RUE 4-/5 proximal to distal.  LLE:  2+/5 prox to 3/5 distally RLE: 3/5 hip flexion, 3+/5 knee extension -stable Skin: Skin is warm and dry.    Psychiatric: pleasant and cooperative.    Assessment/Plan:  1. Functional deficits and tetraplegia secondary to incomplete C4 spinal cord injury which require 3+ hours per day of interdisciplinary therapy in a comprehensive inpatient rehab setting. Physiatrist is providing close team supervision and 24 hour management of  active medical problems listed below. Physiatrist and rehab team continue to assess barriers to discharge/monitor patient progress toward functional and medical goals.  Function:  Bathing Bathing position Bathing activity did not occur: N/A(night bath) Position: Shower  Bathing parts Body parts bathed by patient: Right arm, Left arm, Chest, Abdomen, Front perineal area, Right upper leg, Left upper leg Body parts bathed by helper: Buttocks, Left lower leg, Right lower leg  Bathing assist Assist Level: 2 helpers      Upper Body Dressing/Undressing Upper body dressing Upper body dressing/undressing activity did not occur: (preferred to work on UE strength & use today rather than bathe and dress again.   Stated he did so modifiied bathing on saturday or sunday) What is the patient wearing?: Pull over shirt/dress     Pull over shirt/dress - Perfomed by patient: Thread/unthread right sleeve, Thread/unthread left sleeve, Pull shirt over trunk Pull over shirt/dress - Perfomed by helper: Put head through opening        Upper body assist Assist Level: Touching or steadying assistance(Pt > 75%)      Lower Body Dressing/Undressing Lower body dressing Lower body dressing/undressing activity did not occur: N/A What is the patient wearing?: Pants, Shoes     Pants- Performed by patient: Thread/unthread right pants leg, Thread/unthread left pants leg Pants- Performed by helper: Pull pants up/down Non-skid slipper socks- Performed by patient: Don/doff right sock, Don/doff left sock Non-skid slipper socks- Performed by helper: Don/doff right sock, Don/doff left  sock       Shoes - Performed by helper: Don/doff right shoe, Don/doff left shoe          Lower body assist Assist for lower body dressing: 2 Helpers      Toileting Toileting Toileting activity did not occur: No continent bowel/bladder event Toileting steps completed by patient: Adjust clothing after toileting, Adjust clothing  prior to toileting Toileting steps completed by helper: Performs perineal hygiene    Toileting assist Assist level: More than reasonable time   Transfers Chair/bed transfer Chair/bed transfer activity did not occur: Safety/medical concerns Chair/bed transfer method: Lateral scoot Chair/bed transfer assist level: Touching or steadying assistance (Pt > 75%) Chair/bed transfer assistive device: Sliding board Mechanical lift: Stedy   Locomotion Ambulation Ambulation activity did not occur: Safety/medical Investment banker, operational activity did not occur: Safety/medical concerns Type: Manual Max wheelchair distance: 50 ft Assist Level: Supervision or verbal cues  Cognition Comprehension Comprehension assist level: Follows complex conversation/direction with no assist  Expression Expression assist level: Expresses complex ideas: With no assist  Social Interaction Social Interaction assist level: Interacts appropriately with others - No medications needed.  Problem Solving Problem solving assist level: Solves complex problems: Recognizes & self-corrects  Memory Memory assist level: Complete Independence: No helper  Medical Problem List and Plan:  1. Functional deficits secondary to C4 Spinal cord injury, motor and sensory incomplete   Continue CIR  -making gradual gains. Weight, left knee remain obstacles 2. DVT/PE/Anticoagulation: Patient now on Xarelto.  Reviewed long term need for xarelto with patient 3. Pain Management: managed with prn medications.    -  gabapentin 100 mg 3 times daily for neuropathic pain   4. Mood: LCSW to follow for evaluation and support.  5. Neuropsych: This patient is capable of making decisions on his own behalf.  6. Skin/Wound Care: continue pressure relief measures, appropriate nutrition  Prevalon boots   7. Fluids/Electrolytes/Nutrition: encourage PO  BMP within acceptable range on 4/29 8. T2DM: Monitor BS ac/hs and use SSI for elevated  BS.    Relatively controlled on 5/11 9. CAD: Managed with Lipitor, Plavix, and metoprolol  10. Morbid obesity: Body mass index is 51.76 kg/m.   Diet and exercise education ongoing   -po intake good. 11. Combined systolic/diastolic CHF: Monitor weights daily. Continue Lasix, Plavix, statin and metoprolol.   Filed Weights   12/30/17 0557 01/01/18 0515  Weight: (!) 160.1 kg (352 lb 15.3 oz) (!) 160 kg (352 lb 11.8 oz)   Weight down. I/o's negative   12. HTN: Monitor BP bid. Continue terazosin and furosemide.   Intermittent elevation.   -reasonable control at present 5/11 13. H/o BPH: Reports voiding without difficulty--on Hytrin.  14. Bowel and bladder: appears to be continent    -  OOB to void as much as possible 15. Seasonal allergies?   LOS (Days) 16 A FACE TO FACE EVALUATION WAS PERFORMED  Ranelle Oyster, MD 01/02/2018 9:06 AM

## 2018-01-02 NOTE — Progress Notes (Signed)
Occupational Therapy Session Note  Patient Details  Name: Franklin Hicks MRN: [id number] Date of Birth: [birth year]  Today's Date: 01/02/2018 OT Individual Time: [phone] OT Individual Time Calculation (min): 30 min    Skilled Therapeutic Interventions/Progress Updates: Patient participated in skilled OT today as follows:    Pericleansing and donning shorts=max A rolling in supine in bed,  Lateral leans for assistance for cleansing buttocks and for endurance training = moderate assistance to fully side ly  As well he participated in Endurance training supine in bed with head of bed raise in increase his independence with bed mobility  Supine in bed to go edge of bed transfer=Min A  Patient requested to sit upright edge of bed at the session to self feed (Independent after set up for self feeding)     Therapy Documentation Precautions:  Precautions Precautions: Cervical Required Braces or Orthoses: Cervical Brace Cervical Brace: Hard collar Restrictions Weight Bearing Restrictions: No  Pain: Pain Assessment Pain Scale: 0-10 Pain Score: 0-No pain    Therapy/Group: Individual Therapy  Bud Face [facility] 01/02/2018, 10:23 PM

## 2018-01-03 LAB — GLUCOSE, CAPILLARY
GLUCOSE-CAPILLARY: 112 mg/dL — AB (ref 65–99)
Glucose-Capillary: 126 mg/dL — ABNORMAL HIGH (ref 65–99)
Glucose-Capillary: 77 mg/dL (ref 65–99)
Glucose-Capillary: 102 mg/dL — ABNORMAL HIGH (ref 65–99)

## 2018-01-03 NOTE — Progress Notes (Signed)
[place] PHYSICAL MEDICINE & REHABILITATION     PROGRESS NOTE    Subjective/Complaints: Patient lying in bed.  Had a good nights.  No new problems.  ROS: Patient denies fever, rash, sore throat, blurred vision, nausea, vomiting, diarrhea, cough, shortness of breath or chest pain, joint or back pain, headache, or mood change.    Objective: Vital Signs: Blood pressure (!) 127/53, pulse 67, temperature 98.4 F (36.9 C), temperature source Oral, resp. rate 20, height 6' (1.829 m), weight (!) 160.3 kg (353 lb 6.4 oz), SpO2 95 %. No results found. No results for input(s): WBC, HGB, HCT, PLT in the last 72 hours. No results for input(s): NA, K, CL, GLUCOSE, BUN, CREATININE, CALCIUM in the last 72 hours.  Invalid input(s): CO CBG (last 3)  Recent Labs    01/02/18 1715 01/02/18 2137 01/03/18 0629  GLUCAP 95 98 112*    Wt Readings from Last 3 Encounters:  01/03/18 (!) 160.3 kg (353 lb 6.4 oz)  12/15/17 (!) 173.1 kg (381 lb 9.9 oz)  09/24/17 (!) 160 kg (352 lb 12.8 oz)    Physical Exam:  Constitutional: No distress . Vital signs reviewed. HEENT: EOMI, oral membranes moist Neck: supple Cardiovascular: RRR without murmur. No JVD    Respiratory: CTA Bilaterally without wheezes or rales. Normal effort    GI: BS +, non-tender, non-distended  Musculoskeletal: left knee with KT Neurological: He is alert and oriented  Motor: LUE 3+ to 4- prox to 3/5 HI RUE 4-/5 proximal to distal.  LLE:  2+/5 prox to 3/5 distally RLE: 3/5 hip flexion, 3+/5 knee extension -stable Skin: Skin is warm and dry.    Psychiatric: pleasant and cooperative.    Assessment/Plan:  1. Functional deficits and tetraplegia secondary to incomplete C4 spinal cord injury which require 3+ hours per day of interdisciplinary therapy in a comprehensive inpatient rehab setting. Physiatrist is providing close team supervision and 24 hour management of active medical problems listed below. Physiatrist and rehab team  continue to assess barriers to discharge/monitor patient progress toward functional and medical goals.  Function:  Bathing Bathing position Bathing activity did not occur: N/A(night bath) Position: Shower  Bathing parts Body parts bathed by patient: Right arm, Left arm, Chest, Abdomen, Front perineal area, Right upper leg, Left upper leg Body parts bathed by helper: Buttocks, Left lower leg, Right lower leg  Bathing assist Assist Level: 2 helpers      Upper Body Dressing/Undressing Upper body dressing Upper body dressing/undressing activity did not occur: (preferred to work on UE strength & use today rather than bathe and dress again.   Stated he did so modifiied bathing on saturday or sunday) What is the patient wearing?: Pull over shirt/dress     Pull over shirt/dress - Perfomed by patient: Thread/unthread right sleeve, Thread/unthread left sleeve, Pull shirt over trunk Pull over shirt/dress - Perfomed by helper: Put head through opening        Upper body assist Assist Level: Touching or steadying assistance(Pt > 75%)      Lower Body Dressing/Undressing Lower body dressing Lower body dressing/undressing activity did not occur: N/A What is the patient wearing?: Pants, Shoes     Pants- Performed by patient: Thread/unthread right pants leg, Thread/unthread left pants leg Pants- Performed by helper: Pull pants up/down Non-skid slipper socks- Performed by patient: Don/doff right sock, Don/doff left sock Non-skid slipper socks- Performed by helper: Don/doff right sock, Don/doff left sock       Shoes - Performed by helper: Don/doff  right shoe, Don/doff left shoe          Lower body assist Assist for lower body dressing: 2 Helpers      Toileting Toileting Toileting activity did not occur: No continent bowel/bladder event Toileting steps completed by patient: Adjust clothing after toileting, Adjust clothing prior to toileting Toileting steps completed by helper: Performs  perineal hygiene    Toileting assist Assist level: More than reasonable time   Transfers Chair/bed transfer Chair/bed transfer activity did not occur: Safety/medical concerns Chair/bed transfer method: Lateral scoot Chair/bed transfer assist level: Touching or steadying assistance (Pt > 75%) Chair/bed transfer assistive device: Sliding board Mechanical lift: Stedy   Locomotion Ambulation Ambulation activity did not occur: Safety/medical Investment banker, operational activity did not occur: Safety/medical concerns Type: Manual Max wheelchair distance: 50 ft Assist Level: Supervision or verbal cues  Cognition Comprehension Comprehension assist level: Follows complex conversation/direction with no assist  Expression Expression assist level: Expresses complex ideas: With no assist  Social Interaction Social Interaction assist level: Interacts appropriately with others - No medications needed.  Problem Solving Problem solving assist level: Solves complex problems: Recognizes & self-corrects  Memory Memory assist level: Complete Independence: No helper  Medical Problem List and Plan:  1. Functional deficits secondary to C4 Spinal cord injury, motor and sensory incomplete   Continue CIR 2. DVT/PE/Anticoagulation: Patient now on Xarelto.  Reviewed long term need for xarelto with patient 3. Pain Management: managed with prn medications.    -  gabapentin 100 mg 3 times daily for neuropathic pain   4. Mood: LCSW to follow for evaluation and support.  5. Neuropsych: This patient is capable of making decisions on his own behalf.  6. Skin/Wound Care: continue pressure relief measures, appropriate nutrition  Prevalon boots   7. Fluids/Electrolytes/Nutrition: encourage PO  BMP within acceptable range on 4/29 8. T2DM: Monitor BS ac/hs and use SSI for elevated BS.    Relatively controlled on 5/12 9. CAD: Managed with Lipitor, Plavix, and metoprolol  10. Morbid obesity: Body mass  index is 51.76 kg/m.   Diet and exercise education ongoing   -po intake good. 11. Combined systolic/diastolic CHF: Monitor weights daily. Continue Lasix, Plavix, statin and metoprolol.   Filed Weights   12/30/17 0557 01/01/18 0515 01/03/18 0500  Weight: (!) 160.1 kg (352 lb 15.3 oz) (!) 160 kg (352 lb 11.8 oz) (!) 160.3 kg (353 lb 6.4 oz)   Weight down. I/o's negative   12. HTN: Monitor BP bid. Continue terazosin and furosemide.   Intermittent elevation.   -reasonable control at present 5/12 13. H/o BPH: Reports voiding without difficulty--on Hytrin.  14. Bowel and bladder: appears to be continent    -  OOB to void as much as possible 15. Seasonal allergies?   LOS (Days) 17 A FACE TO FACE EVALUATION WAS PERFORMED  Ranelle Oyster, MD 01/03/2018 8:30 AM

## 2018-01-04 ENCOUNTER — Inpatient Hospital Stay (HOSPITAL_COMMUNITY): Payer: Medicare Other | Admitting: Occupational Therapy

## 2018-01-04 ENCOUNTER — Inpatient Hospital Stay (HOSPITAL_COMMUNITY): Payer: Medicare Other | Admitting: Physical Therapy

## 2018-01-04 LAB — GLUCOSE, CAPILLARY
GLUCOSE-CAPILLARY: 109 mg/dL — AB (ref 65–99)
Glucose-Capillary: 111 mg/dL — ABNORMAL HIGH (ref 65–99)
Glucose-Capillary: 138 mg/dL — ABNORMAL HIGH (ref 65–99)
Glucose-Capillary: 75 mg/dL (ref 65–99)

## 2018-01-04 LAB — BASIC METABOLIC PANEL
Anion gap: 6 (ref 5–15)
BUN: 17 mg/dL (ref 6–20)
CALCIUM: 8.7 mg/dL — AB (ref 8.9–10.3)
CO2: 27 mmol/L (ref 22–32)
Chloride: 108 mmol/L (ref 101–111)
Creatinine, Ser: 1.06 mg/dL (ref 0.61–1.24)
GFR calc non Af Amer: >60 mL/min (ref >60)
GLUCOSE: 118 mg/dL — AB (ref 65–99)
Potassium: 4.2 mmol/L (ref 3.5–5.1)
Sodium: 141 mmol/L (ref 135–145)

## 2018-01-04 NOTE — Progress Notes (Signed)
Occupational Therapy Session Note  Patient Details  Name: Franklin Hicks MRN: [id number] Date of Birth: 1951/08/12  Today's Date: 01/04/2018 OT Individual Time: 6962-9528 OT Individual Time Calculation (min): 67 min    Skilled Therapeutic Interventions/Progress Updates:    Pt greeted supine in bed. No c/o pain. Requesting to complete bathing/dressing. Tx focus on activity tolerance, UE/LE strengthening, sit<stands, and functional transfers during BADL completion EOB. Issued pt with LH sponge to increase independence with LB bathing. Sit<stand with Min A and RW (bed elevated). OT blocked Rt knee while completing posterior hygiene. Lateral leans for frontal hygiene. Pt threaded LEs into pants without use of reacher, however he did use device to doff gripper socks and don his crocs. He only required assist for placement of strap over Lt heel. Pt rolling in bed Rt>Lt with instruction for elevating pants over hips with extra time. He completed this 90% himself! Afterwards, slideboard transfer completed<w/c with steady assist. He was positioned at sink to complete oral care. Notified NT to check on him in a few minutes to place him at bedside near call bell. Told pt to wait for NT assist with verbalized understanding.   Therapy Documentation Precautions:  Precautions Precautions: Cervical Required Braces or Orthoses: Cervical Brace Cervical Brace: Hard collar Restrictions Weight Bearing Restrictions: No Vital Signs: Therapy Vitals Temp: 98.5 F (36.9 C) Temp Source: Oral Pulse Rate: 69 Resp: 18 BP: (!) 125/52 Patient Position (if appropriate): Lying Oxygen Therapy SpO2: 92 % O2 Device: Room Air Pain: No c/o pain during session    ADL: ADL ADL Comments: refer to functional navigator     See Function Navigator for Current Functional Status.   Therapy/Group: Individual Therapy  [name] A Yaslin Kirtley 01/04/2018, 9:14 AM

## 2018-01-04 NOTE — Progress Notes (Signed)
Occupational Therapy Session Note  Patient Details  Name: Franklin Hicks MRN: [id number] Date of Birth: [birth year]  Today's Date: 01/04/2018 OT Individual Time: 1300-1345 OT Individual Time Calculation (min): 45 min    Short Term Goals: Week 3:  OT Short Term Goal 1 (Week 3): Pt will pull up pants with mod A standing in bariatric Stedy OT Short Term Goal 2 (Week 3): Pt will be able to tolerate standing in a stedy fully upright for at least 2 min. OT Short Term Goal 3 (Week 3): Pt will complete LB bathing tasks seated in shower with min A  Skilled Therapeutic Interventions/Progress Updates:    Pt seen for OT session focusing on functional transfers and problem solving of toileting task for d/c. Pt sitting up in w/c upon arrival, agreeable to tx session and denying pain.  Discussed at length d/c planning, specifically toileting needs and options. Pt's w/c will not fit into home bathroom therefore will need to use drop arm BSC at d/c. Pt completed w/c <> drop arm BSC with use of slide board, overall min A with assist to place board and stabilize equipment. Extensive education and demonstration provided regarding different options for hygiene/clothing management. Believe due to pt's body habitus, lateral leans will not be functional. He did stand from [facility] with RW and min A with significantly increased time and UE reliance. Pt able to tolerate standing long enough for caregiver to provide total A for toileting. Pt desiring to keep discussing various options as he really would prefer his wife not to assist with toileting.  Upon sliding board transfer to return to chair, pt voiced need for BM and desired to return to bed to use bed pan. With education and encouragement, pt willing to try STEDY to [facility] over toilet. Completed transfer with overall min A and heavy UE reliance. Pt left seated on toilet at end of session with hand off to NT for supervision and assist when finished.   Therapy  Documentation Precautions:  Precautions Precautions: Cervical Required Braces or Orthoses: Cervical Brace Cervical Brace: Hard collar Restrictions Weight Bearing Restrictions: No ADL: ADL ADL Comments: refer to functional navigator  See Function Navigator for Current Functional Status.   Therapy/Group: Individual Therapy  Kalmen Lollar L 01/04/2018, 7:19 AM

## 2018-01-04 NOTE — Progress Notes (Signed)
Physical Therapy Session Note  Patient Details  Name: Franklin Hicks MRN: [id number] Date of Birth: 1950/09/02  Today's Date: 01/04/2018 PT Individual Time: 1100-1200 and 1445-1525 PT Individual Time Calculation (min): 60 min and 40 min (100 min total)   Short Term Goals: Week 3:  PT Short Term Goal 1 (Week 3): Pt will be able to shift weight in standing with LRAD to unweight alternating LEs in preparation for stand/pivot transfers PT Short Term Goal 2 (Week 3): Pt will verbalize and demonstrate 2 methods of pressure relief in w/c.  PT Short Term Goal 3 (Week 3): Pt will demonstrate proper forward weight shift and head/hips relationship during slide board transfers with supervision for increased safety with transfers.   Skilled Therapeutic Interventions/Progress Updates:    Session 1: no c/o pain.  Session focus on w/c mobility for UE strengthening/cardiovascular endurance/mobility, sit<>stands with RW, and weight shifting in preparation for transfers with RW.    Pt able to propel w/c to therapy gym with increased time and effort.  Discussed potential sizing for w/c at d/c to be slightly narrower and concerns for fit through bedroom/bathroom doors at home.  Initiated discussion of possibilities for hospital bed or other bed in living room until pt able to access bedroom/bathroom.  Pt with little input in discussion, but will continue education and discussion throughout LOS.    Slide board transfer to and from therapy mat, assist to place board and steady w/c, supervision for actual transfer in each direction.  Pt with noted improved ability to shift weight forward and lift bottom to reduce shear forces during slide board transfer.  Sit<>stand from elevated mat x3 with RW, continued heavy reliance on UEs, pt able to shift weight to bring RLE more in line with LLE on each stand this session.  Returned to room in w/c at end of session, NT aware pt wanting to return to EOB.     Session 2: no c/o pain  at rest.  Session focus on LE strengthening and L knee flexibility.  Pt propelled w/c to dayroom with UEs for strengthening and cardiovascular endurance.  Continues to require increased time for w/c mobility.  LE strengthening with 4# ankle weights LAQ with focus on controlled descent, hip flexion, and hip abduction/adduction (knees flexed).  Pt completes exercise 2 trials to fatigue with rest breaks in between.  Pt returned to room in same manner as above and positioned upright with call bell in reach and needs met.   Therapy Documentation Precautions:  Precautions Precautions: Cervical Required Braces or Orthoses: Cervical Brace Cervical Brace: Hard collar Restrictions Weight Bearing Restrictions: No   See Function Navigator for Current Functional Status.   Therapy/Group: Individual Therapy  Michel Santee 01/04/2018, 3:27 PM

## 2018-01-04 NOTE — Progress Notes (Signed)
[facility]IC[facility] & REHABILITATION     PROGRESS NOTE    Subjective/Complaints: No new issues. Up at EOB with OT. Pain under control  ROS: Patient denies fever, rash, sore throat, blurred vision, nausea, vomiting, diarrhea, cough, shortness of breath or chest pain, joint or back pain, headache, or mood change. .    Objective: Vital Signs: Blood pressure (!) 125/52, pulse 69, temperature 98.5 F (36.9 C), temperature source Oral, resp. rate 18, height 6' (1.829 m), weight (!) 160.3 kg (353 lb 6.4 oz), SpO2 92 %. No results found. No results for input(s): WBC, HGB, HCT, PLT in the last 72 hours. Recent Labs    01/04/18 0454  NA 141  K 4.2  CL 108  GLUCOSE 118*  BUN 17  CREATININE 1.06  CALCIUM 8.7*   CBG (last 3)  Recent Labs    01/03/18 1714 01/03/18 2049 01/04/18 0631  GLUCAP 77 102* 111*    Wt Readings from Last 3 Encounters:  01/03/18 (!) 160.3 kg (353 lb 6.4 oz)  12/15/17 (!) 173.1 kg (381 lb 9.9 oz)  09/24/17 (!) 160 kg (352 lb 12.8 oz)    Physical Exam:  Constitutional: No distress . Vital signs reviewed. HEENT: EOMI, oral membranes moist Neck: supple Cardiovascular: RRR without murmur. No JVD    Respiratory: CTA Bilaterally without wheezes or rales. Normal effort    GI: BS +, non-tender, non-distended  Musculoskeletal: left knee can be flex to about 45-50 degrees Neurological: He is alert and oriented  Motor: LUE 3+ to 4- prox to 3/5 HI RUE 4-/5 proximal to distal.  LLE:  2+/5 HF, KE, lmited by knee.   3/5 ADF/PF RLE: 3/5 hip flexion, 3+/5 knee extension -stable Skin: Skin is warm and dry.    Psychiatric: pleasant and cooperative.    Assessment/Plan:  1. Functional deficits and tetraplegia secondary to incomplete C4 spinal cord injury which require 3+ hours per day of interdisciplinary therapy in a comprehensive inpatient rehab setting. Physiatrist is providing close team supervision and 24 hour management of active medical problems  listed below. Physiatrist and rehab team continue to assess barriers to discharge/monitor patient progress toward functional and medical goals.  Function:  Bathing Bathing position Bathing activity did not occur: N/A(night bath) Position: Shower  Bathing parts Body parts bathed by patient: Right arm, Left arm, Chest, Abdomen, Front perineal area, Right upper leg, Left upper leg Body parts bathed by helper: Buttocks, Left lower leg, Right lower leg  Bathing assist Assist Level: 2 helpers      Upper Body Dressing/Undressing Upper body dressing Upper body dressing/undressing activity did not occur: (preferred to work on UE strength & use today rather than bathe and dress again.   Stated he did so modifiied bathing on saturday or sunday) What is the patient wearing?: Pull over shirt/dress     Pull over shirt/dress - Perfomed by patient: Thread/unthread right sleeve, Thread/unthread left sleeve, Pull shirt over trunk Pull over shirt/dress - Perfomed by helper: Put head through opening        Upper body assist Assist Level: Touching or steadying assistance(Pt > 75%)      Lower Body Dressing/Undressing Lower body dressing Lower body dressing/undressing activity did not occur: N/A What is the patient wearing?: Pants, Shoes     Pants- Performed by patient: Thread/unthread right pants leg, Thread/unthread left pants leg Pants- Performed by helper: Pull pants up/down Non-skid slipper socks- Performed by patient: Don/doff right sock, Don/doff left sock Non-skid slipper socks- Performed by  helper: Don/doff right sock, Don/doff left sock       Shoes - Performed by helper: Don/doff right shoe, Don/doff left shoe          Lower body assist Assist for lower body dressing: 2 Helpers      Toileting Toileting Toileting activity did not occur: No continent bowel/bladder event Toileting steps completed by patient: Adjust clothing after toileting, Adjust clothing prior to  toileting Toileting steps completed by helper: Performs perineal hygiene    Toileting assist Assist level: More than reasonable time   Transfers Chair/bed transfer Chair/bed transfer activity did not occur: Safety/medical concerns Chair/bed transfer method: Lateral scoot Chair/bed transfer assist level: Touching or steadying assistance (Pt > 75%) Chair/bed transfer assistive device: Sliding board Mechanical lift: Stedy   Locomotion Ambulation Ambulation activity did not occur: Safety/medical Investment banker, operational activity did not occur: Safety/medical concerns Type: Manual Max wheelchair distance: 50 ft Assist Level: Supervision or verbal cues  Cognition Comprehension Comprehension assist level: Follows complex conversation/direction with no assist  Expression Expression assist level: Expresses complex ideas: With no assist  Social Interaction Social Interaction assist level: Interacts appropriately with others - No medications needed.  Problem Solving Problem solving assist level: Solves complex problems: Recognizes & self-corrects  Memory Memory assist level: Complete Independence: No helper  Medical Problem List and Plan:  1. Functional deficits secondary to C4 Spinal cord injury, motor and sensory incomplete   Continue CIR 2. DVT/PE/Anticoagulation: Patient now on Xarelto.  Reviewed long term need for xarelto with patient 3. Pain Management: managed with prn medications.    -  gabapentin 100 mg 3 times daily for neuropathic pain   4. Mood: LCSW to follow for evaluation and support.  5. Neuropsych: This patient is capable of making decisions on his own behalf.  6. Skin/Wound Care: continue pressure relief measures, appropriate nutrition  Prevalon boots   7. Fluids/Electrolytes/Nutrition: encourage PO  BMP within acceptable range today/reviewed 5/13 8. T2DM: Monitor BS ac/hs and use SSI for elevated BS.    Relatively controlled on 5/13 9. CAD: Managed with  Lipitor, Plavix, and metoprolol  10. Morbid obesity: Body mass index is 51.76 kg/m.   Diet and exercise education ongoing   -po intake good. 11. Combined systolic/diastolic CHF: Monitor weights daily. Continue Lasix, Plavix, statin and metoprolol.   Filed Weights   12/30/17 0557 01/01/18 0515 01/03/18 0500  Weight: (!) 160.1 kg (352 lb 15.3 oz) (!) 160 kg (352 lb 11.8 oz) (!) 160.3 kg (353 lb 6.4 oz)   Weight down. I/o's negative 513   12. HTN: Monitor BP bid. Continue terazosin and furosemide.   Intermittent elevation.   -reasonable control at present 5/13 13. H/o BPH: Reports voiding without difficulty--on Hytrin.  14. Bowel and bladder: appears to be continent    -  OOB to void as much as possible 15. Seasonal allergies?   LOS (Days) 18 A FACE TO FACE EVALUATION WAS PERFORMED  Ranelle Oyster, MD 01/04/2018 8:58 AM

## 2018-01-05 ENCOUNTER — Inpatient Hospital Stay (HOSPITAL_COMMUNITY): Payer: Medicare Other

## 2018-01-05 ENCOUNTER — Inpatient Hospital Stay (HOSPITAL_COMMUNITY): Payer: Medicare Other | Admitting: Physical Therapy

## 2018-01-05 LAB — GLUCOSE, CAPILLARY
Glucose-Capillary: 106 mg/dL — ABNORMAL HIGH (ref 65–99)
Glucose-Capillary: 105 mg/dL — ABNORMAL HIGH (ref 65–99)
Glucose-Capillary: 120 mg/dL — ABNORMAL HIGH (ref 65–99)
Glucose-Capillary: 125 mg/dL — ABNORMAL HIGH (ref 65–99)

## 2018-01-05 NOTE — Plan of Care (Signed)
Toileting goal and dynamic standing balance goal downgraded due to slow pt progress. Modified to max and mod A respectively. See POC for goal details. Larrissa Stivers, OTR/L

## 2018-01-05 NOTE — Progress Notes (Signed)
Recreational Therapy Session Note  Patient Details  Name: DAQUARIUS DUBEAU MRN: [id number] Date of Birth: Jan 21, 1951 Today's Date: 01/05/2018  Pain: no c/o Skilled Therapeutic Interventions/Progress Updates: Due to scheduling constraints, just now getting in to evaluate pt.  Pt seen during co-treat with PT.  TR focused on discharge planning, reviewing pt goals and current status.  Education provided on safety concerns with mobility, how to modify environment/furniture at home so that pt could sit on other seating surfaces other than his w/c and bed.   Also educated pt on how to tell if slide board is in proper location and had pt direct therapist and then assist with placement of the board prior to transfers.  Pt performed sit-stands x3 with RW and contact guard assist given extra time.  While standing, pt was able to weightshift with contact guard assist. Pt stated his biggest concern about discharge was toileting as he does not want his wife to help with hygiene.  Encouraged pt that therapy staff would continue to work with him on identifying strategies to help with this. Will continue to monitor throughout LOS. Shiva Sahagian 01/05/2018, 3:17 PM

## 2018-01-05 NOTE — Progress Notes (Signed)
Physical Therapy Session Note  Patient Details  Name: Franklin Hicks MRN: [id number] Date of Birth: Aug 20, 1951  Today's Date: 01/05/2018 PT Individual Time: 1405-1505 PT Individual Time Calculation (min): 60 min   Short Term Goals: Week 3:  PT Short Term Goal 1 (Week 3): Pt will be able to shift weight in standing with LRAD to unweight alternating LEs in preparation for stand/pivot transfers PT Short Term Goal 2 (Week 3): Pt will verbalize and demonstrate 2 methods of pressure relief in w/c.  PT Short Term Goal 3 (Week 3): Pt will demonstrate proper forward weight shift and head/hips relationship during slide board transfers with supervision for increased safety with transfers.   Skilled Therapeutic Interventions/Progress Updates: Pt presented in w/c agreeable to therapy. Session focused on transfers, standing tolerance and wt shifting/pre-gait activities. Pt propelled w/c to rehab gym supervision level with increased time and effort. Rec therapist joined session and PTA and RT provided pt edu on SB set up and providing communication if family etc would provide assistance for SB set up. Pt able to verbalize to PTA instructions for SB with x 1 correction for B placement. Performed SB transfer with min guard/supervision level to mat. Pt performed sit to/from stand from elevated mat x 3 with RW and min guard with increased time. In standing performed wt shifting and removing single hand then double hands from RW for balance. Pt required increased time between transfers due to fatigue. Pt able to perform lateral lean enough to place SB under leg with min guard and safe placement of SB. Returned to w/c in same manner as prior. Pt propelled w/c back to room supervision level and remained in w/c at end of session with call bell within reach and needs met.         Therapy Documentation Precautions:  Precautions Precautions: Cervical Required Braces or Orthoses: Cervical Brace Cervical Brace: Hard  collar Restrictions Weight Bearing Restrictions: No General:   Vital Signs:  Pain: Pain Assessment Pain Scale: 0-10 Pain Score: 0-No pain Mobility:   Locomotion :    Trunk/Postural Assessment :    Balance:   Exercises:   Other Treatments:     See Function Navigator for Current Functional Status.   Therapy/Group: Individual Therapy  Annisa Mazzarella 01/05/2018, 4:11 PM

## 2018-01-05 NOTE — Progress Notes (Signed)
Physical Therapy Session Note  Patient Details  Name: Franklin Hicks MRN: [id number] Date of Birth: Apr 06, 1951  Today's Date: 01/05/2018 PT Individual Time: 4098-1191 PT Individual Time Calculation (min): 58 min   Short Term Goals: Week 3:  PT Short Term Goal 1 (Week 3): Pt will be able to shift weight in standing with LRAD to unweight alternating LEs in preparation for stand/pivot transfers PT Short Term Goal 2 (Week 3): Pt will verbalize and demonstrate 2 methods of pressure relief in w/c.  PT Short Term Goal 3 (Week 3): Pt will demonstrate proper forward weight shift and head/hips relationship during slide board transfers with supervision for increased safety with transfers.   Skilled Therapeutic Interventions/Progress Updates:    Pt seated in w/c upon PT arrival, agreeable to therapy tx and denies pain. Pt propelled w/c from room>gym with B UEs and supervision, increased time to complete, x 150 ft. Pt performed slideboard transfer from w/c>mat with supervision/set up. Pt performed sit<>stand from elevated mat with RW and min assist, once in standing able to maintain x 60 sec while working on taking single UE off RW and then lateral weightshifting. Pt performed sit<>stand from elevated mat (22.5 inches) with min assist, in standing worked on static standing balance taking hands off walker, able to maintain for 5-10 sec bouts with no UE support and min assist. Pt performed x 2 sit<>stands with RW and min assist, working on lateral weightshifting in place and pre-gait tasks, able to take one step per LE before needing to sit. Pt performed slideboard transfer back to w/c and transported back to room, left seated with needs in reach.   Therapy Documentation Precautions:  Precautions Precautions: Cervical Required Braces or Orthoses: Cervical Brace Cervical Brace: Hard collar Restrictions Weight Bearing Restrictions: No   See Function Navigator for Current Functional Status.   Therapy/Group:  Individual Therapy  Cresenciano Genre, PT, DPT 01/05/2018, 7:56 AM

## 2018-01-05 NOTE — Plan of Care (Signed)
D/c gait and stair goal as not a focus at this time.  Will have ramp and be w/c level at d/c.

## 2018-01-05 NOTE — Progress Notes (Signed)
[place] PHYSICAL MEDICINE & REHABILITATION     PROGRESS NOTE    Subjective/Complaints: Up in bed. No new problems. Pain controlled. Making gains in therapy  ROS: Patient denies fever, rash, sore throat, blurred vision, nausea, vomiting, diarrhea, cough, shortness of breath or chest pain, joint or back pain, headache, or mood change.   Objective: Vital Signs: Blood pressure (!) 156/65, pulse 84, temperature 97.6 F (36.4 C), temperature source Oral, resp. rate 18, height 6' (1.829 m), weight (!) 160.3 kg (353 lb 6.4 oz), SpO2 96 %. No results found. No results for input(s): WBC, HGB, HCT, PLT in the last 72 hours. Recent Labs    01/04/18 0454  NA 141  K 4.2  CL 108  GLUCOSE 118*  BUN 17  CREATININE 1.06  CALCIUM 8.7*   CBG (last 3)  Recent Labs    01/04/18 1629 01/04/18 2117 01/05/18 0631  GLUCAP 75 109* 106*    Wt Readings from Last 3 Encounters:  01/03/18 (!) 160.3 kg (353 lb 6.4 oz)  12/15/17 (!) 173.1 kg (381 lb 9.9 oz)  09/24/17 (!) 160 kg (352 lb 12.8 oz)    Physical Exam:  Constitutional: No distress . Vital signs reviewed. HEENT: EOMI, oral membranes moist Neck: supple Cardiovascular: RRR without murmur. No JVD    Respiratory: CTA Bilaterally without wheezes or rales. Normal effort    GI: BS +, non-tender, non-distended   Musculoskeletal: left knee can be flex to about 45-50 degrees Neurological: He is alert and oriented  Motor: LUE 3+ to 4- prox to 3+/5 HI RUE 4-/5 proximal to distal. 3+ to 4-HI  LLE:  2+/5 HF, KE, lmited by knee.   3/5 ADF/PF RLE: 3/5 hip flexion, 3+/5 knee extension -stable LE Skin: Skin is warm and dry.    Psychiatric: pleasant and cooperative.    Assessment/Plan:  1. Functional deficits and tetraplegia secondary to incomplete C4 spinal cord injury which require 3+ hours per day of interdisciplinary therapy in a comprehensive inpatient rehab setting. Physiatrist is providing close team supervision and 24 hour management of  active medical problems listed below. Physiatrist and rehab team continue to assess barriers to discharge/monitor patient progress toward functional and medical goals.  Function:  Bathing Bathing position Bathing activity did not occur: N/A(night bath) Position: Sitting EOB  Bathing parts Body parts bathed by patient: Right arm, Left arm, Chest, Abdomen, Front perineal area, Right upper leg, Left upper leg, Right lower leg, Left lower leg Body parts bathed by helper: Buttocks  Bathing assist Assist Level: Assistive device, Touching or steadying assistance(Pt > 75%) Assistive Device Comment: LH sponge    Upper Body Dressing/Undressing Upper body dressing Upper body dressing/undressing activity did not occur: (preferred to work on UE strength & use today rather than bathe and dress again.   Stated he did so modifiied bathing on saturday or sunday) What is the patient wearing?: Pull over shirt/dress     Pull over shirt/dress - Perfomed by patient: Thread/unthread right sleeve, Thread/unthread left sleeve, Pull shirt over trunk Pull over shirt/dress - Perfomed by helper: Put head through opening        Upper body assist Assist Level: Touching or steadying assistance(Pt > 75%)      Lower Body Dressing/Undressing Lower body dressing Lower body dressing/undressing activity did not occur: N/A What is the patient wearing?: Pants, Shoes     Pants- Performed by patient: Thread/unthread right pants leg, Thread/unthread left pants leg Pants- Performed by helper: Pull pants up/down Non-skid slipper socks-  Performed by patient: Don/doff right sock, Don/doff left sock Non-skid slipper socks- Performed by helper: Don/doff right sock, Don/doff left sock     Shoes - Performed by patient: Don/doff right shoe Shoes - Performed by helper: Don/doff left shoe          Lower body assist Assist for lower body dressing: Assistive device Assistive Device Comment: Personal assistant activity did not occur: No continent bowel/bladder event Toileting steps completed by patient: Adjust clothing prior to toileting, Performs perineal hygiene, Adjust clothing after toileting Toileting steps completed by helper: Adjust clothing prior to toileting, Performs perineal hygiene, Adjust clothing after toileting    Toileting assist Assist level: Two helpers   Transfers Chair/bed transfer Chair/bed transfer activity did not occur: Safety/medical concerns Chair/bed transfer method: Lateral scoot Chair/bed transfer assist level: Touching or steadying assistance (Pt > 75%) Chair/bed transfer assistive device: Sliding board Mechanical lift: Stedy   Locomotion Ambulation Ambulation activity did not occur: Safety/medical Investment banker, operational activity did not occur: Safety/medical concerns Type: Manual Max wheelchair distance: 50 ft Assist Level: Supervision or verbal cues  Cognition Comprehension Comprehension assist level: Follows complex conversation/direction with no assist  Expression Expression assist level: Expresses complex ideas: With no assist  Social Interaction Social Interaction assist level: Interacts appropriately with others - No medications needed.  Problem Solving Problem solving assist level: Solves complex problems: Recognizes & self-corrects  Memory Memory assist level: Complete Independence: No helper  Medical Problem List and Plan:  1. Functional deficits secondary to C4 Spinal cord injury, motor and sensory incomplete   Continue CIR, team conference today 2. DVT/PE/Anticoagulation: xarelto long term 3. Pain Management: managed with prn medications.    -  gabapentin 100 mg 3 times daily for neuropathic pain   4. Mood: LCSW to follow for evaluation and support.  5. Neuropsych: This patient is capable of making decisions on his own behalf.  6. Skin/Wound Care: continue pressure relief measures, appropriate nutrition  Prevalon  boots   7. Fluids/Electrolytes/Nutrition: encourage PO   8. T2DM: Monitor BS ac/hs and use SSI for elevated BS.    Controlled on 5/14 9. CAD: Managed with Lipitor, Plavix, and metoprolol  10. Morbid obesity: Body mass index is 51.76 kg/m.   Diet and exercise education ongoing   -po intake good. 11. Combined systolic/diastolic CHF: Monitor weights daily. Continue Lasix, Plavix, statin and metoprolol.   Filed Weights   12/30/17 0557 01/01/18 0515 01/03/18 0500  Weight: (!) 160.1 kg (352 lb 15.3 oz) (!) 160 kg (352 lb 11.8 oz) (!) 160.3 kg (353 lb 6.4 oz)   Weight remains stable 514   12. HTN: Monitor BP bid. Continue terazosin and furosemide.   Intermittent elevation.   -reasonable control 5/14 13. H/o BPH: Reports voiding without difficulty--on Hytrin.  14. Bowel and bladder: appears to be continent and emptying without issue   -  OOB to void as much as possible     LOS (Days) 19 A FACE TO FACE EVALUATION WAS PERFORMED  Ranelle Oyster, MD 01/05/2018 9:08 AM

## 2018-01-05 NOTE — Progress Notes (Signed)
Occupational Therapy Session Note  Patient Details  Name: Franklin Hicks MRN: [id number] Date of Birth: 07-Jun-1951  Today's Date: 01/05/2018 OT Individual Time: 0845-1000 OT Individual Time Calculation (min): 75 min    Short Term Goals: Week 3:  OT Short Term Goal 1 (Week 3): Pt will pull up pants with mod A standing in bariatric Stedy OT Short Term Goal 2 (Week 3): Pt will be able to tolerate standing in a stedy fully upright for at least 2 min. OT Short Term Goal 3 (Week 3): Pt will complete LB bathing tasks seated in shower with min A  Skilled Therapeutic Interventions/Progress Updates:    Pt received supine agreeable to therapy. Session focused on problem solving through toileting tasks in prep for d/c, b/d tasks, and ADL transfers. Vc provided for safety during transition to EOB, as well as discussion re carryover to home environment. Pt completed UB b/d with min A for overhead shirt donning d/t B shoulder limitations. Pt completed B LE threading through pants sitting EOB with (S). Allowed pt extra time to problem solve through donning pants via lateral leans/returning to supine. Pt ultimately unable to lateral lean far enough d/t body habitus. Pt returned to supine and rolled R and L 8x to don pants 90% of the way up. Pt then returned to EOB and completed sit to stand transfer with RW and required min A to don pants completely. Pt able to complete transfer at [facility] level with increased time, appropriate bed inflation/height, and with increased forward trunk flexion. Extensive discussion, demo, and problem solving with pt re toileting tasks at home, including types of AE, locations of BSC in the home, and technique for performing posterior peri-care. Pt completed sliding board transfer to w/c with (S) and heavy w/c support. Pt left with all needs met.   Therapy Documentation Precautions:  Precautions Precautions: Cervical Required Braces or Orthoses: Cervical Brace Cervical Brace: Hard  collar Restrictions Weight Bearing Restrictions: No Pain: Pain Assessment Pain Scale: 0-10 Pain Score: 0-No pain ADL: ADL ADL Comments: refer to functional navigator  See Function Navigator for Current Functional Status.   Therapy/Group: Individual Therapy  Curtis Sites 01/05/2018, 12:15 PM

## 2018-01-06 ENCOUNTER — Inpatient Hospital Stay (HOSPITAL_COMMUNITY): Payer: Medicare Other | Admitting: Occupational Therapy

## 2018-01-06 ENCOUNTER — Inpatient Hospital Stay (HOSPITAL_COMMUNITY): Payer: Medicare Other | Admitting: Physical Therapy

## 2018-01-06 LAB — GLUCOSE, CAPILLARY
GLUCOSE-CAPILLARY: 126 mg/dL — AB (ref 65–99)
GLUCOSE-CAPILLARY: 123 mg/dL — AB (ref 65–99)
Glucose-Capillary: 117 mg/dL — ABNORMAL HIGH (ref 65–99)
Glucose-Capillary: 102 mg/dL — ABNORMAL HIGH (ref 65–99)

## 2018-01-06 NOTE — Progress Notes (Signed)
Social Work Patient ID: Franklin Hicks, male   DOB: 24-Mar-1951, 67 y.o.   MRN: [id number]   Met with pt this afternoon to review team conference and discuss d/c planning needs.  Pt aware that we continue to plan toward 01/13/18 d/c but did note that therapies would like to "regroup" at beginning of week to see if any benefit to a short extension.  Pt states if would be agreeable "if they felt there is a benefit to it."   We discussed status of ramp - pt reports the are awaiting two estimates.  Pt asking if insurance will cover costs of ramp and home modifications which I informed they will not.  He has other questions about DME and HH follow up.   Wife entering room while we were talking and we discussed need for family ed and she is agreed to be here on Friday to begin (I will alert txs). Pt then informed her that insurance would not assist with home mod costs.  She became a little tearful and simply looked at pt and shrugged shoulders.  Pt then stated that he had no further questions.    Willow Reczek, LCSW

## 2018-01-06 NOTE — Progress Notes (Signed)
[place] PHYSICAL MEDICINE & REHABILITATION     PROGRESS NOTE    Subjective/Complaints: Pt in bed. No problems over night. Pain controlled.  ROS: Patient denies fever, rash, sore throat, blurred vision, nausea, vomiting, diarrhea, cough, shortness of breath or chest pain, joint or back pain, headache, or mood change.    Objective: Vital Signs: Blood pressure 124/60, pulse 71, temperature (!) 97.5 F (36.4 C), temperature source Oral, resp. rate 16, height 6' (1.829 m), weight (!) 160 kg (352 lb 11.8 oz), SpO2 97 %. No results found. No results for input(s): WBC, HGB, HCT, PLT in the last 72 hours. Recent Labs    01/04/18 0454  NA 141  K 4.2  CL 108  GLUCOSE 118*  BUN 17  CREATININE 1.06  CALCIUM 8.7*   CBG (last 3)  Recent Labs    01/05/18 1642 01/05/18 2141 01/06/18 0629  GLUCAP 120* 125* 117*    Wt Readings from Last 3 Encounters:  01/06/18 (!) 160 kg (352 lb 11.8 oz)  12/15/17 (!) 173.1 kg (381 lb 9.9 oz)  09/24/17 (!) 160 kg (352 lb 12.8 oz)    Physical Exam:  Constitutional: No distress . Vital signs reviewed. Morbidly obese, HEENT: EOMI, oral membranes moist. Collar in place Neck: supple Cardiovascular: RRR without murmur. No JVD    Respiratory: CTA Bilaterally without wheezes or rales. Normal effort    GI: BS +, non-tender, non-distended  Musculoskeletal: left knee can be flex to about 30-40 degrees in supine Neurological: He is alert and oriented  Motor: LUE 3+ to 4- prox to 3+/5 HI RUE 4-/5 proximal to distal. 3+ to 4-HI  LLE:  2+/5 HF, KE, lmited by knee.   3/5 ADF/PF RLE: 3/5 hip flexion, 3+/5 knee extension -no change Skin: Skin is warm and dry.    Psychiatric: pleasant and cooperative.    Assessment/Plan:  1. Functional deficits and tetraplegia secondary to incomplete C4 spinal cord injury which require 3+ hours per day of interdisciplinary therapy in a comprehensive inpatient rehab setting. Physiatrist is providing close team supervision  and 24 hour management of active medical problems listed below. Physiatrist and rehab team continue to assess barriers to discharge/monitor patient progress toward functional and medical goals.  Function:  Bathing Bathing position Bathing activity did not occur: N/A(night bath) Position: Sitting EOB  Bathing parts Body parts bathed by patient: Right arm, Left arm, Chest, Abdomen, Front perineal area, Right upper leg, Left upper leg, Right lower leg, Left lower leg Body parts bathed by helper: Buttocks, Back  Bathing assist Assist Level: Touching or steadying assistance(Pt > 75%) Assistive Device Comment: LH sponge    Upper Body Dressing/Undressing Upper body dressing Upper body dressing/undressing activity did not occur: (preferred to work on UE strength & use today rather than bathe and dress again.   Stated he did so modifiied bathing on saturday or sunday) What is the patient wearing?: Pull over shirt/dress     Pull over shirt/dress - Perfomed by patient: Thread/unthread right sleeve, Thread/unthread left sleeve, Put head through opening Pull over shirt/dress - Perfomed by helper: Pull shirt over trunk        Upper body assist Assist Level: Touching or steadying assistance(Pt > 75%)      Lower Body Dressing/Undressing Lower body dressing Lower body dressing/undressing activity did not occur: N/A What is the patient wearing?: Pants, Shoes     Pants- Performed by patient: Thread/unthread right pants leg, Thread/unthread left pants leg, Pull pants up/down Pants- Performed by helper:  Pull pants up/down Non-skid slipper socks- Performed by patient: Don/doff right sock, Don/doff left sock Non-skid slipper socks- Performed by helper: Don/doff right sock, Don/doff left sock     Shoes - Performed by patient: Don/doff right shoe Shoes - Performed by helper: Don/doff left shoe          Lower body assist Assist for lower body dressing: Touching or steadying assistance (Pt >  75%) Assistive Device Comment: Programmer, systems activity did not occur: No continent bowel/bladder event Toileting steps completed by patient: Adjust clothing prior to toileting, Performs perineal hygiene, Adjust clothing after toileting Toileting steps completed by helper: Adjust clothing prior to toileting, Performs perineal hygiene, Adjust clothing after toileting    Toileting assist Assist level: Two helpers   Transfers Chair/bed transfer Chair/bed transfer activity did not occur: Safety/medical concerns Chair/bed transfer method: Lateral scoot Chair/bed transfer assist level: Touching or steadying assistance (Pt > 75%) Chair/bed transfer assistive device: Sliding board Mechanical lift: Stedy   Locomotion Ambulation Ambulation activity did not occur: Safety/medical Investment banker, operational activity did not occur: Safety/medical concerns Type: Manual Max wheelchair distance: 100 ft Assist Level: Supervision or verbal cues  Cognition Comprehension Comprehension assist level: Follows complex conversation/direction with no assist  Expression Expression assist level: Expresses complex ideas: With no assist  Social Interaction Social Interaction assist level: Interacts appropriately with others - No medications needed.  Problem Solving Problem solving assist level: Solves complex problems: Recognizes & self-corrects  Memory Memory assist level: Complete Independence: No helper  Medical Problem List and Plan:  1. Functional deficits secondary to C4 Spinal cord injury, motor and sensory incomplete   Discussed dc goals, needs.   -seemed a bit surprised that he will need assist with some ADL's at discharge.   -need family ed 2. DVT/PE/Anticoagulation: xarelto long term 3. Pain Management: managed with prn medications.    -  gabapentin 100 mg 3 times daily for neuropathic pain   4. Mood: LCSW to follow for evaluation and support.  5.  Neuropsych: This patient is capable of making decisions on his own behalf.  6. Skin/Wound Care: continue pressure relief measures, appropriate nutrition  Prevalon boots   7. Fluids/Electrolytes/Nutrition: encourage PO   8. T2DM: Monitor BS ac/hs and use SSI for elevated BS.    Controlled on 5/15 9. CAD: Managed with Lipitor, Plavix, and metoprolol  10. Morbid obesity: Body mass index is 51.76 kg/m.   Diet and exercise education ongoing   -po intake good. 11. Combined systolic/diastolic CHF: Monitor weights daily. Continue Lasix, Plavix, statin and metoprolol.   Filed Weights   01/01/18 0515 01/03/18 0500 01/06/18 0522  Weight: (!) 160 kg (352 lb 11.8 oz) (!) 160.3 kg (353 lb 6.4 oz) (!) 160 kg (352 lb 11.8 oz)   Weight remains stable 5/15   12. HTN: Monitor BP bid. Continue terazosin and furosemide.   Intermittent elevation.   -reasonable control 5/15 13. H/o BPH: Reports voiding without difficulty--on Hytrin.  14. Bowel and bladder: appears to be continent and emptying without issue   -  OOB to void as much as possible     LOS (Days) 20 A FACE TO FACE EVALUATION WAS PERFORMED  Ranelle Oyster, MD 01/06/2018 9:07 AM

## 2018-01-06 NOTE — Progress Notes (Signed)
Occupational Therapy Session Note  Patient Details  Name: Franklin Hicks MRN: [id number] Date of Birth: 06-25-1951  Today's Date: 01/06/2018 OT Individual Time: 1100-1200 OT Individual Time Calculation (min): 60 min    Short Term Goals: Week 3:  OT Short Term Goal 1 (Week 3): Pt will pull up pants with mod A standing in bariatric Stedy OT Short Term Goal 2 (Week 3): Pt will be able to tolerate standing in a stedy fully upright for at least 2 min. OT Short Term Goal 3 (Week 3): Pt will complete LB bathing tasks seated in shower with min A  Skilled Therapeutic Interventions/Progress Updates:    Pt seen for OT session focusing on functional transfers and continuing with problem solving toileting options for d/c. Pt sitting up in w/c upon arrival, agreeable to tx session. Discussed toileting options for d/c. He completed sliding board transfer w/c <> drop arm BSC with assist to steady equipment and VCs for head/hip relationship. Pt able to complete lateral leans onto bed and into w/c placed on R/L in order to get pants down. Completed simulated hygiene by coming into modified stand and wiping from the front. He was able to pull pants back up using combination of lateral leans and modified stand. Assist to stabilize equipment. Discussed safety risks with lateral leans vs standing and what level of assist pt's wife is able to offer at d/c. Plans for her to come in for hands on training prior to d/c home next week.  Pt returned to w/c at end of session, left seated with all needs in reach.   Therapy Documentation Precautions:  Precautions Precautions: Cervical Required Braces or Orthoses: Cervical Brace Cervical Brace: Hard collar Restrictions Weight Bearing Restrictions: No Pain:   No/denies pain ADL: ADL ADL Comments: refer to functional navigator  See Function Navigator for Current Functional Status.   Therapy/Group: Individual Therapy  Farrie Sann L 01/06/2018, 7:15 AM

## 2018-01-06 NOTE — Progress Notes (Signed)
Physical Therapy Session Note  Patient Details  Name: Franklin Hicks MRN: [id number] Date of Birth: 1951-07-31  Today's Date: 01/06/2018 PT Individual Time: 1330-1445 PT Individual Time Calculation (min): 75 min   Short Term Goals: Week 3:  PT Short Term Goal 1 (Week 3): Pt will be able to shift weight in standing with LRAD to unweight alternating LEs in preparation for stand/pivot transfers PT Short Term Goal 2 (Week 3): Pt will verbalize and demonstrate 2 methods of pressure relief in w/c.  PT Short Term Goal 3 (Week 3): Pt will demonstrate proper forward weight shift and head/hips relationship during slide board transfers with supervision for increased safety with transfers.   Skilled Therapeutic Interventions/Progress Updates:    Pt seated in w/c in room, agreeable to PT. No complaints of pain. Sliding board transfer w/c to mat table with SBA, setup assist for sliding board placement. Sit to stand x 10 reps from mat table to RW with CGA to min A, heavy reliance on UE on RW to assist with standing. Use of mirror for visual feedback for upright posture. Focus on adjusting BLE in standing so that feet are shoulder width apart and directly under patient as he tends to keep BLE in a stride stance with LLE forwards when coming to a standing position. Simulation of clothing management in standing by pulling orange theraband down from waist to thigh height and then back up with use of altering UE with only one UE support on RW and min A for balance. Stand step turn transfer mat table to w/c with RW and min A for balance, sliding board positioned just in case pt needs to sit mid-transfer. Pt struggles with bringing LLE all the way back when going to sit but makes it safely back to his w/c. Manual w/c propulsion x 100 ft with min A. Pt left seated in w/c in room with needs in reach in care of RN.  Therapy Documentation Precautions:  Precautions Precautions: Cervical Required Braces or Orthoses: Cervical  Brace Cervical Brace: Hard collar Restrictions Weight Bearing Restrictions: No  See Function Navigator for Current Functional Status.   Therapy/Group: Individual Therapy  Peter Congo, PT, DPT  01/06/2018, 5:09 PM

## 2018-01-06 NOTE — Progress Notes (Signed)
Physical Therapy Session Note  Patient Details  Name: Franklin Hicks MRN: [id number] Date of Birth: 05/16/1951  Today's Date: 01/06/2018 PT Individual Time: [phone]   60 min   Short Term Goals: Week 3:  PT Short Term Goal 1 (Week 3): Pt will be able to shift weight in standing with LRAD to unweight alternating LEs in preparation for stand/pivot transfers PT Short Term Goal 2 (Week 3): Pt will verbalize and demonstrate 2 methods of pressure relief in w/c.  PT Short Term Goal 3 (Week 3): Pt will demonstrate proper forward weight shift and head/hips relationship during slide board transfers with supervision for increased safety with transfers.  Week 4:     Skilled Therapeutic Interventions/Progress Updates:   Pt received sitting EOB and agreeable to PT. PT instructed pt in hygiene task of bathing EOB. Distant supervision assist from PT with intermittent cues for posture, and attention to LE and underarms due to difficulty reaching these body parts due to UE weakness. Pt perform dressing task for upper baody and lower body with supervision assist from PT for pants and shirt. Cues for decreased cervical rotation to improve safety, reduce risk of injury and education for purpose of neck brace. Lateral lean technique to pull pants to waist while sitting EOB.  Sit<>stand from EOB x 3 with RW and min assist from PT. Reciprocal stepping x 2 BLE with UE support on RW. Pt able to clear BLE from ground without knee instability. PT instructed pt in attempted stand pivot transfer to [facility]. Pt able to step LLE once, and R LE 3 times, but unable to clear LLE again to complete transfer due to fear of R knee buckling. Pt sat back on EOB/SB and completed transfer with supervision assist from PT. Patient left sitting in [facility] with call bell in reach and all needs met.         Therapy Documentation Precautions:  Precautions Precautions: Cervical Required Braces or Orthoses: Cervical Brace Cervical Brace: Hard  collar Restrictions Weight Bearing Restrictions: No   Vital Signs: Therapy Vitals Temp: (!) 97.5 F (36.4 C) Temp Source: Oral Pulse Rate: 71 Resp: 16 BP: 124/60 Patient Position (if appropriate): Lying Oxygen Therapy SpO2: 97 % O2 Device: Room Air Pain: Denies.   See Function Navigator for Current Functional Status.   Therapy/Group: Individual Therapy  Lorie Phenix 01/06/2018, 7:59 AM

## 2018-01-06 NOTE — Patient Care Conference (Signed)
Inpatient RehabilitationTeam Conference and Plan of Care Update Date: 01/05/2018   Time: 2:10 PM    Patient Name: Franklin Hicks      Medical Record Number: [id number]  Date of Birth: June 30, 1951 Sex: Male         Room/Bed: 4W16C/4W16C-01 Payor Info: Payor: Advertising copywriter MEDICARE / Plan: [employer] MEDICARE / Product Type: *No Product type* /    Admitting Diagnosis: SCI  Admit Date/Time:  12/17/2017  5:28 PM Admission Comments: No comment available   Primary Diagnosis:  C4 spinal cord injury, sequela (HCC) Principal Problem: C4 spinal cord injury, sequela [facility])  Patient Active Problem List   Diagnosis Date Noted  . Pain of left heel   . Diabetes mellitus type 2 in obese (HCC)   . C4 spinal cord injury, sequela (HCC) 12/18/2017  . Tetraplegia (HCC) 12/18/2017  . Acute pulmonary embolism (HCC) 12/18/2017  . Aortic aneurysm (HCC) 12/16/2017  . Fall 12/13/2017  . Cord compression (HCC) 12/13/2017  . Abnormal CT scan, cervical spine 12/13/2017  . Chronic combined systolic and diastolic heart failure (HCC) 10/09/2016  . CAD S/P percutaneous coronary angioplasty 09/20/2016  . H/O non-ST elevation myocardial infarction (NSTEMI) 09/20/2016  . Erectile dysfunction 07/30/2015  . Morbid obesity with BMI of 50.0-59.9, adult (HCC) 07/18/2013  . History of pulmonary embolism 07/13/2013  . Angioedema of lips 07/13/2013  . Multinodular goiter 07/13/2013  . Dyslipidemia, goal LDL below 70 10/02/2011  . SOB (shortness of breath) 10/02/2011  . BPH with urinary obstruction 07/17/2010  . Diabetes mellitus type 2, controlled (HCC) 07/17/2010  . Bilateral lower extremity edema 12/08/2008  . NEUROPATHY, IDIOPATHIC PERIPHERAL NEC 05/31/2007  . Osteoarthritis 03/22/2007  . Gout 03/15/2007  . Essential hypertension 03/15/2007    Expected Discharge Date: Expected Discharge Date: 01/13/18  Team Members Present: Physician leading conference: Dr. Faith Rogue Social Worker Present: Amada Jupiter, LCSW Nurse  Present: Chana Bode, RN PT Present: Teodoro Kil, Grayland Ormond, PT OT Present: Johnsie Cancel, OT PPS Coordinator present : Edson Snowball, PT     Current Status/Progress Goal Weekly Team Focus  Medical   motor function improving. right knee is a barrier still as s weight  see prior, optimize use of right lower ext  see prior   Bowel/Bladder   Continent of bowel/bladder. LBM 01/04/18  Remain continent of bowel/bladder with min assist  Assess bowel/bladder function q shift and as needed   Swallow/Nutrition/ Hydration             ADL's   LB bathing/dressing min A  returning to supine to pull pants up; total A toileting; min A functional transfers using sliding board; min A static standing at RW  min A overall  Sit>stand, functional transfers, ADL re-training, B UE ROM   Mobility   supervision for bed mobility, supervision for sit<>stand with RW but heavy reliance on UEs, supervision w/c mobility but limited by weight and positioning  supervision for transfers, will d/c gait, supervision/mod I for w/c mobility  standing tolerance and pre-gait, w/c mobility, pressure relief, stretching, strengthening and activity tolerance   Communication             Safety/Cognition/ Behavioral Observations            Pain   No complain of pain  <2  Assess and treat pain q shift and as needed   Skin   Bilateral lower extremities edema; MASD to the groin  Skin to be free of infection/breakdown with min assist  Assess skin  q shift and as needed    Rehab Goals Patient on target to meet rehab goals: Yes *See Care Plan and progress notes for long and short-term goals.     Barriers to Discharge  Current Status/Progress Possible Resolutions Date Resolved   Physician    Medical stability  morbid obesity, right knee contracture            Nursing                  PT                    OT                  SLP                SW                Discharge Planning/Teaching Needs:  Home  with wife and other family members providing 24/7 assistance.   Will scheduled education to begin this week.   Team Discussion:  Continues to make slow neurological improvement;  Left knee still a barrier.  Supervision bed mob and scoot with sl board.  Needs bed/ surgace elevated for sit - stands and really relies on arm and hands to stay up.  Pt does not want anyone to help with toileting/ toilet hygeine but therapists do not see anyway around this. Ready to start family ed.    Revisions to Treatment Plan:  PT planning to d/c gait and stair goals    Continued Need for Acute Rehabilitation Level of Care: The patient requires daily medical management by a physician with specialized training in physical medicine and rehabilitation for the following conditions: Daily direction of a multidisciplinary physical rehabilitation program to ensure safe treatment while eliciting the highest outcome that is of practical value to the patient.: Yes Daily medical management of patient stability for increased activity during participation in an intensive rehabilitation regime.: Yes Daily analysis of laboratory values and/or radiology reports with any subsequent need for medication adjustment of medical intervention for : Neurological problems;Other;Post surgical problems  Kourtnei Rauber 01/06/2018, 4:28 PM

## 2018-01-07 ENCOUNTER — Inpatient Hospital Stay (HOSPITAL_COMMUNITY): Payer: Medicare Other | Admitting: Occupational Therapy

## 2018-01-07 ENCOUNTER — Inpatient Hospital Stay (HOSPITAL_COMMUNITY): Payer: Medicare Other

## 2018-01-07 ENCOUNTER — Inpatient Hospital Stay (HOSPITAL_COMMUNITY): Payer: Medicare Other | Admitting: Physical Therapy

## 2018-01-07 LAB — GLUCOSE, CAPILLARY
GLUCOSE-CAPILLARY: 106 mg/dL — AB (ref 65–99)
GLUCOSE-CAPILLARY: 97 mg/dL (ref 65–99)
GLUCOSE-CAPILLARY: 129 mg/dL — AB (ref 65–99)
Glucose-Capillary: 108 mg/dL — ABNORMAL HIGH (ref 65–99)

## 2018-01-07 NOTE — Progress Notes (Signed)
Physical Therapy Make-up Session Note  Patient Details  Name: Franklin Hicks MRN: [id number] Date of Birth: [birth year]  Today's Date: 01/07/2018 PT Individual Time: 1325-1355 PT Individual Time Calculation (min): 30 min   Skilled Therapeutic Interventions/Progress Updates:    Session focused on sit <> stand retraining with RW and progressing stand step transfers with RW from w/c <> mat to increase functional independence with mobility. Pt requires overall min assist for balance and during transitional movements with extra time and unconventional technique. Pt able to complete stand step transfers with improved foot clearance noted and cues for upright posture. Pt making excellent progress and discussed initiating gait trial in future session.   Therapy Documentation Precautions:  Precautions Precautions: Cervical Required Braces or Orthoses: Cervical Brace Cervical Brace: Hard collar Restrictions Weight Bearing Restrictions: No   Pain:  Denies pain.    See Function Navigator for Current Functional Status.   Therapy/Group: Individual Therapy  Karolee Stamps Darrol Poke, PT, DPT  01/07/2018, 2:48 PM

## 2018-01-07 NOTE — Progress Notes (Signed)
Occupational Therapy Session Note  Patient Details  Name: Franklin Hicks MRN: [id number] Date of Birth: Nov 14, 1950  Today's Date: 01/07/2018 OT Individual Time: 4098-1191 OT Individual Time Calculation (min): 60 min    Short Term Goals: Week 3:  OT Short Term Goal 1 (Week 3): Pt will pull up pants with mod A standing in bariatric Stedy OT Short Term Goal 2 (Week 3): Pt will be able to tolerate standing in a stedy fully upright for at least 2 min. OT Short Term Goal 3 (Week 3): Pt will complete LB bathing tasks seated in shower with min A  Skilled Therapeutic Interventions/Progress Updates:    Pt seen for OT session focusing on functional transfers and cont to problem solve toileting. Pt sitting up in w/c upon arrival,a greeable to tx session. Pt in agreement that toileting task a main priority before upcoming d/c. Practiced this during yesterday's session, modified activity by lowering BSC one level in order for pt's feet to touch floor for increased sitting baance.  Pt positioned w/c in prep for transfer to drop arm BSC with VCs for positioning and assist for w/c parts management. He completed sliding board transfer w/c > drop arm BSC with assist to place board and steady equipment ony. Pt with easier time transferring to lowered BSC as w/c and BSC now at same level. Pt's feet still not touching floor, however, believe if BSC was lowered again pt would not be able to stand from low surface as well as creating uphill transfer back to w/c.  Education and demonstration provided regarding use of toilet aid for increased reach, only able to provide pt with demo toilet aid at this time though pt believes this will work for him at d/c. He was able to use demo in simulated manner with success during modified stand at [facility] with steadying assist of equipment. Pt returned to w/c at end of session in same manner as described above. Pt left seated in w/c at end of session, all needs in reach. Discussed planned  family ed for tomorrow, pt ready.   Therapy Documentation Precautions:  Precautions Precautions: Cervical Required Braces or Orthoses: Cervical Brace Cervical Brace: Hard collar Restrictions Weight Bearing Restrictions: No Pain:   No/denies pain ADL: ADL ADL Comments: refer to functional navigator  See Function Navigator for Current Functional Status.   Therapy/Group: Individual Therapy  Vandell Kun L 01/07/2018, 6:58 AM

## 2018-01-07 NOTE — Progress Notes (Signed)
Physical Therapy Session Note  Patient Details  Name: Franklin Hicks MRN: [id number] Date of Birth: November 17, 1950  Today's Date: 01/07/2018 PT Individual Time: 0800-0830 PT Individual Time Calculation (min): 30 min   Short Term Goals: Week 3:  PT Short Term Goal 1 (Week 3): Pt will be able to shift weight in standing with LRAD to unweight alternating LEs in preparation for stand/pivot transfers PT Short Term Goal 2 (Week 3): Pt will verbalize and demonstrate 2 methods of pressure relief in w/c.  PT Short Term Goal 3 (Week 3): Pt will demonstrate proper forward weight shift and head/hips relationship during slide board transfers with supervision for increased safety with transfers.   Skilled Therapeutic Interventions/Progress Updates: Pt received seated on EOB finishing breakfast; denies pain and agreeable to treatment. Requests to wash up and put clothes on. Performed bathing minA for assist washing back, setupA to obtain items. Pt dons shirt and shorts setupA with increased time and lateral leans. Sit <>stand x3 reps with bedrails and RW min guard/minA to boost hips up to standing. Remained seated on EOB at end of session, all needs in reach.      Therapy Documentation Precautions:  Precautions Precautions: Cervical Required Braces or Orthoses: Cervical Brace Cervical Brace: Hard collar Restrictions Weight Bearing Restrictions: No  See Function Navigator for Current Functional Status.   Therapy/Group: Individual Therapy  Vista Lawman 01/07/2018, 9:26 AM

## 2018-01-07 NOTE — Progress Notes (Signed)
Physical Therapy Session Note  Patient Details  Name: Franklin Hicks MRN: [id number] Date of Birth: 1950/12/13  Today's Date: 01/07/2018 PT Individual Time: 1445-1600 PT Individual Time Calculation (min): 75 min   Short Term Goals: Week 3:  PT Short Term Goal 1 (Week 3): Pt will be able to shift weight in standing with LRAD to unweight alternating LEs in preparation for stand/pivot transfers PT Short Term Goal 2 (Week 3): Pt will verbalize and demonstrate 2 methods of pressure relief in w/c.  PT Short Term Goal 3 (Week 3): Pt will demonstrate proper forward weight shift and head/hips relationship during slide board transfers with supervision for increased safety with transfers.   Skilled Therapeutic Interventions/Progress Updates:    no c/o pain.  Session focus on stand pivot transfers with RW, initiation of gait training, problem solving for car transfers, and w/c positioning.    Pt completes sit<>stand with RW with overall min guard and heavy reliance on UEs throughout session.  Pt able to ambulate 2 steps BLEs x2 trials with RW, min +2 and 3rd person for w/c follow for safety.  Pt's knees buckled on first trial, necessitating chair to be pushed underneath, but pt able to control sit on second trial.  Taken to ortho gym to begin problem solving how to get into his car.  Per measurement sheet, car seat is 3' from the ground, however this appears to be measured incorrectly.  Using measurements from a full size SUV of different make, estimated height of 30".   Pt able to practice stand pivot from w/c>therapy mat at 31" with min assist +2 for safety and increased time to complete transfer.  PT providing cues for upright posture throughout to facilitate easier weight shifting.  Slide board transfers x3 throughout session with set up of board and supervision.  PT providing min cues for head/hips relationship.  PT also reapplied kinesiotape to L knee with support strips on medial and lateral borders of knee.   Returned to room in w/c at end of session, call bell in reach and needs met.   Therapy Documentation Precautions:  Precautions Precautions: Cervical Required Braces or Orthoses: Cervical Brace Cervical Brace: Hard collar Restrictions Weight Bearing Restrictions: No   See Function Navigator for Current Functional Status.   Therapy/Group: Individual Therapy  Michel Santee 01/07/2018, 5:07 PM

## 2018-01-07 NOTE — Plan of Care (Signed)
  Problem: Consults Goal: RH SPINAL CORD INJURY PATIENT EDUCATION Description  See Patient Education module for education specifics.  Outcome: Progressing   Problem: SCI BOWEL ELIMINATION Goal: RH STG MANAGE BOWEL WITH ASSISTANCE Description STG Manage Bowel with min Assistance.  Outcome: Progressing   Problem: SCI BLADDER ELIMINATION Goal: RH STG MANAGE BLADDER WITH ASSISTANCE Description STG Manage Bladder With min Assistance  Outcome: Progressing   Problem: RH SKIN INTEGRITY Goal: RH STG SKIN FREE OF INFECTION/BREAKDOWN Description No new breakdown with min assist   Outcome: Progressing Goal: RH STG ABLE TO PERFORM INCISION/WOUND CARE W/ASSISTANCE Description STG Able To Perform Incision/Wound Care With Min Assistance.  Outcome: Progressing   Problem: RH SAFETY Goal: RH STG ADHERE TO SAFETY PRECAUTIONS W/ASSISTANCE/DEVICE Description STG Adhere to Safety Precautions With min Assistance/Device.  Outcome: Progressing   Problem: RH PAIN MANAGEMENT Goal: RH STG PAIN MANAGED AT OR BELOW PT'S PAIN GOAL Description <3 out of 10.   Outcome: Progressing   

## 2018-01-07 NOTE — Progress Notes (Signed)
[place] PHYSICAL MEDICINE & REHABILITATION     PROGRESS NOTE    Subjective/Complaints: No new issues. Up sitting EOB. Pain controlled.   ROS: Patient denies fever, rash, sore throat, blurred vision, nausea, vomiting, diarrhea, cough, shortness of breath or chest pain, joint or back pain, headache, or mood change.   Objective: Vital Signs: Blood pressure 120/67, pulse 80, temperature 98.4 F (36.9 C), temperature source Oral, resp. rate 18, height 6' (1.829 m), weight (!) 160.2 kg (353 lb 2.8 oz), SpO2 96 %. No results found. No results for input(s): WBC, HGB, HCT, PLT in the last 72 hours. No results for input(s): NA, K, CL, GLUCOSE, BUN, CREATININE, CALCIUM in the last 72 hours.  Invalid input(s): CO CBG (last 3)  Recent Labs    01/06/18 1635 01/06/18 2047 01/07/18 0620  GLUCAP 123* 102* 108*    Wt Readings from Last 3 Encounters:  01/07/18 (!) 160.2 kg (353 lb 2.8 oz)  12/15/17 (!) 173.1 kg (381 lb 9.9 oz)  09/24/17 (!) 160 kg (352 lb 12.8 oz)    Physical Exam:  Constitutional: No distress . Vital signs reviewed. obese HEENT: EOMI, oral membranes moist Neck: supple Cardiovascular: RRR without murmur. No JVD    Respiratory: CTA Bilaterally without wheezes or rales. Normal effort    GI: BS +, non-tender, non-distended   Musculoskeletal: left knee can be flex to about 30-40 degrees in supine Neurological: He is alert and oriented  Motor: LUE 3+ to 4- prox to 3+/5 HI RUE 4-/5 proximal to distal. 3+ to 4-HI  LLE:  2+/5 HF, KE, lmited by knee.   3/5 ADF/PF RLE: 3/5 hip flexion, 3+/5 knee extension -stable motor exam Skin: Skin is warm and dry.    Psychiatric: pleasant and cooperative.    Assessment/Plan:  1. Functional deficits and tetraplegia secondary to incomplete C4 spinal cord injury which require 3+ hours per day of interdisciplinary therapy in a comprehensive inpatient rehab setting. Physiatrist is providing close team supervision and 24 hour management of  active medical problems listed below. Physiatrist and rehab team continue to assess barriers to discharge/monitor patient progress toward functional and medical goals.  Function:  Bathing Bathing position Bathing activity did not occur: N/A(night bath) Position: Sitting EOB  Bathing parts Body parts bathed by patient: Right arm, Left arm, Chest, Abdomen, Front perineal area, Right upper leg, Left upper leg, Right lower leg, Left lower leg Body parts bathed by helper: Buttocks, Back  Bathing assist Assist Level: Touching or steadying assistance(Pt > 75%) Assistive Device Comment: LH sponge    Upper Body Dressing/Undressing Upper body dressing Upper body dressing/undressing activity did not occur: (preferred to work on UE strength & use today rather than bathe and dress again.   Stated he did so modifiied bathing on saturday or sunday) What is the patient wearing?: Pull over shirt/dress     Pull over shirt/dress - Perfomed by patient: Thread/unthread right sleeve, Thread/unthread left sleeve, Put head through opening Pull over shirt/dress - Perfomed by helper: Pull shirt over trunk        Upper body assist Assist Level: Touching or steadying assistance(Pt > 75%)      Lower Body Dressing/Undressing Lower body dressing Lower body dressing/undressing activity did not occur: N/A What is the patient wearing?: Pants, Shoes     Pants- Performed by patient: Thread/unthread right pants leg, Thread/unthread left pants leg, Pull pants up/down Pants- Performed by helper: Pull pants up/down Non-skid slipper socks- Performed by patient: Don/doff right sock, Don/doff left  sock Non-skid slipper socks- Performed by helper: Don/doff right sock, Don/doff left sock     Shoes - Performed by patient: Don/doff right shoe Shoes - Performed by helper: Don/doff left shoe          Lower body assist Assist for lower body dressing: Touching or steadying assistance (Pt > 75%) Assistive Device Comment:  Programmer, systems activity did not occur: No continent bowel/bladder event Toileting steps completed by patient: Adjust clothing prior to toileting, Performs perineal hygiene, Adjust clothing after toileting Toileting steps completed by helper: Adjust clothing prior to toileting, Performs perineal hygiene, Adjust clothing after toileting    Toileting assist Assist level: Touching or steadying assistance (Pt.75%)   Transfers Chair/bed transfer Chair/bed transfer activity did not occur: Safety/medical concerns Chair/bed transfer method: Stand pivot Chair/bed transfer assist level: Touching or steadying assistance (Pt > 75%) Chair/bed transfer assistive device: Armrests, Walker Mechanical lift: Landscape architect Ambulation activity did not occur: Safety/medical Investment banker, operational activity did not occur: Safety/medical concerns Type: Manual Max wheelchair distance: 100 ft Assist Level: Supervision or verbal cues  Cognition Comprehension Comprehension assist level: Follows complex conversation/direction with no assist  Expression Expression assist level: Expresses complex ideas: With no assist  Social Interaction Social Interaction assist level: Interacts appropriately with others - No medications needed.  Problem Solving Problem solving assist level: Solves complex problems: Recognizes & self-corrects  Memory Memory assist level: Complete Independence: No helper  Medical Problem List and Plan:  1. Functional deficits secondary to C4 Spinal cord injury, motor and sensory incomplete   Discussed dc goals, needs.   -family ed  2. DVT/PE/Anticoagulation: xarelto long term 3. Pain Management: managed with prn medications.    -  gabapentin 100 mg 3 times daily for neuropathic pain   4. Mood: LCSW to follow for evaluation and support.  5. Neuropsych: This patient is capable of making decisions on his own behalf.  6. Skin/Wound Care:  continue pressure relief measures, appropriate nutrition  Prevalon boots   7. Fluids/Electrolytes/Nutrition: encourage PO   8. T2DM: Monitor BS ac/hs and use SSI for elevated BS.    Controlled on 5/16 9. CAD: Managed with Lipitor, Plavix, and metoprolol  10. Morbid obesity: Body mass index is 51.76 kg/m.   Diet and exercise education ongoing   -po intake good. 11. Combined systolic/diastolic CHF: Monitor weights daily. Continue Lasix, Plavix, statin and metoprolol.   Filed Weights   01/03/18 0500 01/06/18 0522 01/07/18 0523  Weight: (!) 160.3 kg (353 lb 6.4 oz) (!) 160 kg (352 lb 11.8 oz) (!) 160.2 kg (353 lb 2.8 oz)   Weight remains stable 5/16   12. HTN: Monitor BP bid. Continue terazosin and furosemide.   Intermittent elevation.   -reasonable control 5/16 13. H/o BPH: Reports voiding without difficulty--on Hytrin.  14. Bowel and bladder: appears to be continent and emptying without issue   -  OOB to void as much as possible     LOS (Days) 21 A FACE TO FACE EVALUATION WAS PERFORMED  Ranelle Oyster, MD 01/07/2018 8:41 AM

## 2018-01-07 NOTE — Progress Notes (Signed)
Physical Therapy Session Note  Patient Details  Name: Franklin Hicks MRN: [id number] Date of Birth: April 04, 1951  Today's Date: 01/07/2018 PT Individual Time: 0900-0930 PT Individual Time Calculation (min): 30 min   Short Term Goals: Week 3:  PT Short Term Goal 1 (Week 3): Pt will be able to shift weight in standing with LRAD to unweight alternating LEs in preparation for stand/pivot transfers PT Short Term Goal 2 (Week 3): Pt will verbalize and demonstrate 2 methods of pressure relief in w/c.  PT Short Term Goal 3 (Week 3): Pt will demonstrate proper forward weight shift and head/hips relationship during slide board transfers with supervision for increased safety with transfers.   Skilled Therapeutic Interventions/Progress Updates:    no c/o pain.  Session focus on slide board transfers, self care at sink level, and d/c planning.    Pt able to transfer to w/c with slide board and set up assist to place board.  PT donned w/c leg rests and pt propelled to sink for oral care at mod I level.  PT and pt discussed upcoming discharge including plan for assessing car transfers this afternoon, home entry, DME to include w/c, hospital bed, slide board, and RW.  Pt left upright in w/c with call bell in reach and needs met.   Therapy Documentation Precautions:  Precautions Precautions: Cervical Required Braces or Orthoses: Cervical Brace Cervical Brace: Hard collar Restrictions Weight Bearing Restrictions: No   See Function Navigator for Current Functional Status.   Therapy/Group: Individual Therapy  Michel Santee 01/07/2018, 11:33 AM

## 2018-01-08 ENCOUNTER — Inpatient Hospital Stay (HOSPITAL_COMMUNITY): Payer: Medicare Other | Admitting: Occupational Therapy

## 2018-01-08 ENCOUNTER — Encounter (HOSPITAL_COMMUNITY): Payer: Medicare Other

## 2018-01-08 ENCOUNTER — Inpatient Hospital Stay (HOSPITAL_COMMUNITY): Payer: Medicare Other | Admitting: Physical Therapy

## 2018-01-08 LAB — GLUCOSE, CAPILLARY
GLUCOSE-CAPILLARY: 142 mg/dL — AB (ref 65–99)
GLUCOSE-CAPILLARY: 102 mg/dL — AB (ref 65–99)
Glucose-Capillary: 125 mg/dL — ABNORMAL HIGH (ref 65–99)
Glucose-Capillary: 91 mg/dL (ref 65–99)

## 2018-01-08 NOTE — Progress Notes (Signed)
[place] PHYSICAL MEDICINE & REHABILITATION     PROGRESS NOTE    Subjective/Complaints: Happy that he took a few steps with therapy yesterday. Denies pain  ROS: Patient denies fever, rash, sore throat, blurred vision, nausea, vomiting, diarrhea, cough, shortness of breath or chest pain, joint or back pain, headache, or mood change.    Objective: Vital Signs: Blood pressure (!) 119/57, pulse 84, temperature 99.5 F (37.5 C), temperature source Oral, resp. rate 19, height 6' (1.829 m), weight (!) 161 kg (355 lb), SpO2 95 %. No results found. No results for input(s): WBC, HGB, HCT, PLT in the last 72 hours. No results for input(s): NA, K, CL, GLUCOSE, BUN, CREATININE, CALCIUM in the last 72 hours.  Invalid input(s): CO CBG (last 3)  Recent Labs    01/07/18 1636 01/07/18 2157 01/08/18 0700  GLUCAP 97 129* 125*    Wt Readings from Last 3 Encounters:  01/08/18 (!) 161 kg (355 lb)  12/15/17 (!) 173.1 kg (381 lb 9.9 oz)  09/24/17 (!) 160 kg (352 lb 12.8 oz)    Physical Exam:  Constitutional: No distress . Vital signs reviewed. HEENT: EOMI, oral membranes moist Neck: supple Cardiovascular: RRR without murmur. No JVD    Respiratory: CTA Bilaterally without wheezes or rales. Normal effort    GI: BS +, non-tender, non-distended   Musculoskeletal: left knee can be flex to about 40 degrees in supine Neurological: He is alert and oriented  Motor: LUE 3+ to 4 prox to 4/5 HI RUE 4/5 proximal to distal. 4/5HI  LLE:  2+/5 HF, KE, lmited by knee.   4/5 ADF/PF RLE: 3/5 hip flexion, 3+/5 knee extension to 4/5 distally Skin: Skin is warm and dry.    Psychiatric: pleasant and cooperative.    Assessment/Plan:  1. Functional deficits and tetraplegia secondary to incomplete C4 spinal cord injury which require 3+ hours per day of interdisciplinary therapy in a comprehensive inpatient rehab setting. Physiatrist is providing close team supervision and 24 hour management of active medical  problems listed below. Physiatrist and rehab team continue to assess barriers to discharge/monitor patient progress toward functional and medical goals.  Function:  Bathing Bathing position Bathing activity did not occur: N/A(night bath) Position: Sitting EOB  Bathing parts Body parts bathed by patient: Right arm, Left arm, Chest, Abdomen, Front perineal area, Right upper leg, Left upper leg, Right lower leg, Left lower leg Body parts bathed by helper: Buttocks, Back  Bathing assist Assist Level: Touching or steadying assistance(Pt > 75%) Assistive Device Comment: LH sponge    Upper Body Dressing/Undressing Upper body dressing Upper body dressing/undressing activity did not occur: (preferred to work on UE strength & use today rather than bathe and dress again.   Stated he did so modifiied bathing on saturday or sunday) What is the patient wearing?: Pull over shirt/dress     Pull over shirt/dress - Perfomed by patient: Thread/unthread right sleeve, Thread/unthread left sleeve, Put head through opening, Pull shirt over trunk Pull over shirt/dress - Perfomed by helper: Pull shirt over trunk        Upper body assist Assist Level: Set up      Lower Body Dressing/Undressing Lower body dressing Lower body dressing/undressing activity did not occur: N/A What is the patient wearing?: Pants, Shoes     Pants- Performed by patient: Thread/unthread right pants leg, Thread/unthread left pants leg, Pull pants up/down Pants- Performed by helper: Pull pants up/down Non-skid slipper socks- Performed by patient: Don/doff right sock, Don/doff left sock Non-skid  slipper socks- Performed by helper: Don/doff right sock, Don/doff left sock     Shoes - Performed by patient: Don/doff right shoe Shoes - Performed by helper: Don/doff left shoe, Don/doff right shoe          Lower body assist Assist for lower body dressing: Set up Assistive Device Comment: Reacher Set up : To obtain clothing/put away   Toileting Toileting Toileting activity did not occur: No continent bowel/bladder event Toileting steps completed by patient: Adjust clothing prior to toileting, Performs perineal hygiene, Adjust clothing after toileting Toileting steps completed by helper: Adjust clothing prior to toileting, Performs perineal hygiene, Adjust clothing after toileting    Toileting assist Assist level: Touching or steadying assistance (Pt.75%)   Transfers Chair/bed transfer Chair/bed transfer activity did not occur: Safety/medical concerns Chair/bed transfer method: Stand pivot Chair/bed transfer assist level: Touching or steadying assistance (Pt > 75%) Chair/bed transfer assistive device: Walker Mechanical lift: Magazine features editor activity did not occur: Safety/medical Investment banker, operational activity did not occur: Safety/medical concerns Type: Manual Max wheelchair distance: 100 ft Assist Level: Supervision or verbal cues  Cognition Comprehension Comprehension assist level: Follows complex conversation/direction with no assist  Expression Expression assist level: Expresses complex ideas: With no assist  Social Interaction Social Interaction assist level: Interacts appropriately with others - No medications needed.  Problem Solving Problem solving assist level: Solves complex problems: Recognizes & self-corrects  Memory Memory assist level: Complete Independence: No helper  Medical Problem List and Plan:  1. Functional deficits secondary to C4 Spinal cord injury, motor and sensory incomplete   Discussed dc goals, needs.   -family ed  2. DVT/PE/Anticoagulation: xarelto long term 3. Pain Management: managed with prn medications.    -  gabapentin 100 mg 3 times daily for neuropathic pain   4. Mood: LCSW to follow for evaluation and support.  5. Neuropsych: This patient is capable of making decisions on his own behalf.  6. Skin/Wound Care: continue pressure  relief measures, appropriate nutrition  Prevalon boots   7. Fluids/Electrolytes/Nutrition: encourage PO   8. T2DM: Monitor BS ac/hs and use SSI for elevated BS.    Controlled on 5/17 9. CAD: Managed with Lipitor, Plavix, and metoprolol  10. Morbid obesity: Body mass index is 51.76 kg/m.   Diet and exercise education ongoing   -po intake good. 11. Combined systolic/diastolic CHF: Monitor weights daily. Continue Lasix, Plavix, statin and metoprolol.   Filed Weights   01/06/18 0522 01/07/18 0523 01/08/18 0700  Weight: (!) 160 kg (352 lb 11.8 oz) (!) 160.2 kg (353 lb 2.8 oz) (!) 161 kg (355 lb)   Weight remains stable 5/17   12. HTN: Monitor BP bid. Continue terazosin and furosemide.   Intermittent elevation.   -reasonable control 5/17 13. H/o BPH: Reports voiding without difficulty--on Hytrin.  14. Bowel and bladder: appears to be continent and emptying without issue   -  OOB to void as much as possible     LOS (Days) 22 A FACE TO FACE EVALUATION WAS PERFORMED  Ranelle Oyster, MD 01/08/2018 9:08 AM

## 2018-01-08 NOTE — Progress Notes (Signed)
Occupational Therapy Weekly Progress Note  Patient Details  Name: Franklin Hicks MRN: [id number] Date of Birth: [birth year]  Beginning of progress report period: Jan 01, 2018 End of progress report period: Jan 08, 2018  Today's Date: 01/08/2018 OT Individual Time: 8546-2703 OT Individual Time Calculation (min): 75 min    Short term goals set for pt during this reporting period became not applicable as all were based on use of STEDY and pt now able to complete functional tasks without need for lift. He is making excellent progress in regards to functional standing balance and tolerance and it is now at functional level.   Patient continues to demonstrate the following deficits:acute pain and paraplegia at level C6    and therefore will continue to benefit from skilled OT intervention to enhance overall performance with BADL and Reduce care partner burden.  Patient progressing toward long term goals..  Plan of care revisions: Goals had been modified during reporting period. Pt cont to make great gains in OT due to improved functional standing endurance/balance. Goals overall min A. .  OT Short Term Goals Week 3:  OT Short Term Goal 1 (Week 3): Pt will pull up pants with mod A standing in bariatric Stedy OT Short Term Goal 1 - Progress (Week 3): Discontinued (comment) OT Short Term Goal 2 (Week 3): Pt will be able to tolerate standing in a stedy fully upright for at least 2 min. OT Short Term Goal 2 - Progress (Week 3): Discontinued (comment) OT Short Term Goal 3 (Week 3): Pt will complete LB bathing tasks seated in shower with min A OT Short Term Goal 3 - Progress (Week 3): Met Week 4:  OT Short Term Goal 1 (Week 4): STG=LTG due to LOS  Skilled Therapeutic Interventions/Progress Updates:    Pt seen for OT session focusing on functional transfers and ADL re-training. Pt being assisted to sitting EOB by nurse upon arrival, agreeable to tx session. Pt desiring to get on standard toilet, however,  provided education and discussion regarding need for use of BSC at d/c as pt's home bathroom not accessible to w/c level. He donned pants sitting EOB, able to reach to floor to thread pants and completed multiple lateral leans in order to pull pants up. Pt agreeable to getting to [facility]. He completed sliding board transfer to [facility] with assist to place board and stabilize equipment during transfer. Pt with continent void on BSC, also completed bathing/dressing while seated on BSC.Pt required min A to pull shirt entirely overhead due to limitations of cervical brace and limited UE ROM.  Toilet aid still not available, so therapist assisted with hygiene following BM. Pt able to come into modified stand at RW and assist with clothing management, assist required to pull pants up entirely in back.  Pt completed sliding board transfer to w/c in same manner as described above. Pt left set-up with breakfast tray at end of session, all needs in reach.   Therapy Documentation Precautions:  Precautions Precautions: Cervical Required Braces or Orthoses: Cervical Brace Cervical Brace: Hard collar Restrictions Weight Bearing Restrictions: No Pain:   No/denies pain ADL: ADL ADL Comments: refer to functional navigator  See Function Navigator for Current Functional Status.   Therapy/Group: Individual Therapy  Ieisha Gao L 01/08/2018, 6:17 AM

## 2018-01-08 NOTE — Progress Notes (Signed)
Physical Therapy Weekly Progress Note  Patient Details  Name: Franklin Hicks MRN: [id number] Date of Birth: 1951-04-19  Beginning of progress report period: Jan 01, 2018 End of progress report period: Jan 08, 2018  Today's Date: 01/08/2018 PT Individual Time: 1350-1505 PT Individual Time Calculation (min): 75 min   Patient has met 3 of 3 short term goals.  Pt continues to make slow but steady progress with therapy.  He is able to inconsistently transfer with stand/pivot transfers with RW, depending on fatigue and RLE stamina.  Have discussed, at length, continuing to use slide board for transfers within the home and stand/pivot for car transfer and pt agreeable.  Patient continues to demonstrate the following deficits muscle weakness, abnormal tone and unbalanced muscle activation and decreased standing balance, decreased postural control, decreased balance strategies and tetraplegia and therefore will continue to benefit from skilled PT intervention to increase functional independence with mobility.  Patient progressing toward long term goals..  Continue plan of care.  PT Short Term Goals Week 3:  PT Short Term Goal 1 (Week 3): Pt will be able to shift weight in standing with LRAD to unweight alternating LEs in preparation for stand/pivot transfers PT Short Term Goal 1 - Progress (Week 3): Met PT Short Term Goal 2 (Week 3): Pt will verbalize and demonstrate 2 methods of pressure relief in w/c.  PT Short Term Goal 2 - Progress (Week 3): Met PT Short Term Goal 3 (Week 3): Pt will demonstrate proper forward weight shift and head/hips relationship during slide board transfers with supervision for increased safety with transfers.  PT Short Term Goal 3 - Progress (Week 3): Met Week 4:  PT Short Term Goal 1 (Week 4): =LTGs due to ELOS  Skilled Therapeutic Interventions/Progress Updates:    no c/o pain.  Just completed OT family education and fatigued.  Session focus on attempt at practicing real car  transfer, education for upcoming d/c, and activity tolerance/UE strengthening via w/c mobility.    Pt taken down to main entrance to problem solve and practice real car transfer.  Pt unable to actually complete transfer into car due to fatigue from previous OT session; however, was able to stand x2 using car for stability with min assist and maintain standing up to 1 minute.  Unable to safely shift weight to step/pivot to car, but discussed lower height of car (about 26" versus the 36" written on home measurement sheet), and confidence that when rested pt will be able to complete this stand/pivot transfer as he has in previous sessions.  On return to unit, discussed with pt upcoming discharge, and pt feels with use of toilet aid he will be ready to d/c on Wednesday.  Discussed PT accompanying pt to car for car transfer on Wednesday, discussed energy conservation at d/c and planning his day to allow time to rest/recover from ADLs prior to leaving, discussed potential to use [facility] for transportation if worried about car transfer, and discussed finalizing w/c order through [facility] today.  Pt propelled w/c back to room with BUEs for strengthening and endurance, with significantly increased time and effort.  Positioned upright in w/c with call bell in reach and needs met.   Therapy Documentation Precautions:  Precautions Precautions: Cervical Required Braces or Orthoses: Cervical Brace Cervical Brace: Hard collar Restrictions Weight Bearing Restrictions: No  See Function Navigator for Current Functional Status.  Therapy/Group: Individual Therapy  Michel Santee 01/08/2018, 3:22 PM

## 2018-01-08 NOTE — Progress Notes (Signed)
Occupational Therapy Session Note  Patient Details  Name: Franklin Hicks MRN: [id number] Date of Birth: 01/09/1951  Today's Date: 01/08/2018 OT Individual Time: 1300-1345 OT Individual Time Calculation (min): 45 min     Skilled Therapeutic Interventions/Progress Updates:    1;1. Pt, wife and daughter present during session for family education on toileting. Pt completes lateral scoot transfer with OT demonstrating/educating on board placement, head hips relationship, set up for transfer, w/c parts management and stabilization of equipment throughout. Pt completes 1 lateral scoot transfer with wife and 2 with daughter. Daughter demonstrated good SB placement on second transfer without need for cuing, however wife required A to place slide board correctly and could not perform second one d/t fatigue. Would recommend wife practice hands on in therapy again at later date and practice stabilizing objects while seated to improve ability to facilitate transfer. Family unable to practice clothing management d/t time constraints however OT demonstrated and educated on lateral lean/ semi-stand method. Family would benefit from practice of this as well. Exited session with pt seated in w/c, call light in reach and all needs met  Therapy Documentation Precautions:  Precautions Precautions: Cervical Required Braces or Orthoses: Cervical Brace Cervical Brace: Hard collar Restrictions Weight Bearing Restrictions: No  See Function Navigator for Current Functional Status.   Therapy/Group: Individual Therapy  Tonny Branch 01/08/2018, 4:30 PM

## 2018-01-09 ENCOUNTER — Inpatient Hospital Stay (HOSPITAL_COMMUNITY): Payer: Medicare Other | Admitting: Physical Therapy

## 2018-01-09 LAB — GLUCOSE, CAPILLARY
GLUCOSE-CAPILLARY: 116 mg/dL — AB (ref 65–99)
GLUCOSE-CAPILLARY: 134 mg/dL — AB (ref 65–99)
Glucose-Capillary: 84 mg/dL (ref 65–99)
Glucose-Capillary: 132 mg/dL — ABNORMAL HIGH (ref 65–99)

## 2018-01-09 NOTE — Progress Notes (Signed)
Physical Therapy Session Note  Patient Details  Name: Franklin Hicks MRN: [id number] Date of Birth: 04/27/1951  Today's Date: 01/09/2018 PT Individual Time: 1130-1200 PT Individual Time Calculation (min): 30 min   Short Term Goals: Week 4:  PT Short Term Goal 1 (Week 4): =LTGs due to ELOS  Skilled Therapeutic Interventions/Progress Updates: Pt presented sitting EOB agreeable to therapy. Performed SB transfer to w/c with PTA setting up board and min guard for transfer. PTA transported pt to rehab gym for time management. Pt performed stand pivot transfer to R to mat with min guard and significantly increased time. Pt performed SB transfer to return to w/c with PTA setting up board. Pt returned to room and remained in w/c with call bell within reach and needs met.      Therapy Documentation Precautions:  Precautions Precautions: Cervical Required Braces or Orthoses: Cervical Brace Cervical Brace: Hard collar Restrictions Weight Bearing Restrictions: No General:   Vital Signs:   See Function Navigator for Current Functional Status.   Therapy/Group: Individual Therapy  [name]  [name], PTA  01/09/2018, 12:46 PM

## 2018-01-09 NOTE — Progress Notes (Signed)
  [place] PHYSICAL MEDICINE & REHABILITATION     PROGRESS NOTE    Subjective/Complaints: He feels that therapy is going well.  He has no specific complaints.  He denies chest pain, shortness of breath.  Objective: Vital Signs: Blood pressure (!) 126/58, pulse 78, temperature 98.4 F (36.9 C), temperature source Oral, resp. rate 18, height 6' (1.829 m), weight (!) 358 lb (162.4 kg), SpO2 98 %.  No acute distress. HEENT exam atraumatic, normocephalic Neck is supple without lymphadenopathy Chest clear to auscultation Cardiac exam S1-S2 regular Abdominal exam: Morbidly obese, active bowel sounds, soft Extremities without edema.   Assessment/Plan:  1. Functional deficits and tetraplegia secondary to incomplete C4 spinal cord injury    Medical Problem List and Plan:  1. Functional deficits secondary to C4 Spinal cord injury, motor and sensory incomplete   Discussed dc goals, needs.   -family ed  2. DVT/PE/Anticoagulation: xarelto long term 3. Pain Management: managed with prn medications.    -  gabapentin 100 mg 3 times daily for neuropathic pain   4. Mood: LCSW to follow for evaluation and support.  5. Neuropsych: This patient is capable of making decisions on his own behalf.  6. Skin/Wound Care: continue pressure relief measures, appropriate nutrition Basic Metabolic Panel:    Component Value Date/Time   NA 141 01/04/2018 0454   K 4.2 01/04/2018 0454   CL 108 01/04/2018 0454   CO2 27 01/04/2018 0454   BUN 17 01/04/2018 0454   CREATININE 1.06 01/04/2018 0454   GLUCOSE 118 (H) 01/04/2018 0454   CALCIUM 8.7 (L) 01/04/2018 0454      8. T2DM: Monitor BS ac/hs and use SSI for elevated BS.   CBG (last 3)  Recent Labs    01/08/18 2117 01/09/18 0610 01/09/18 1132  GLUCAP 91 116* 134*   Controlled.  Continue current medications. 9. CAD: Managed with Lipitor, Plavix, and metoprolol  10. Morbid obesity: Body mass index is 51.76 kg/m.   Diet and exercise education  ongoing   -po intake good. 11. Combined systolic/diastolic CHF: Monitor weights daily. Continue Lasix, Plavix, statin and metoprolol.   Filed Weights   01/07/18 0523 01/08/18 0700 01/09/18 0449  Weight: (!) 353 lb 2.8 oz (160.2 kg) (!) 355 lb (161 kg) (!) 358 lb (162.4 kg)   Weight remains stable 5/17   12. HTN: Monitor adequately controlled. 13. H/o BPH: Reports voiding without difficulty--on Hytrin.  14. Bowel and bladder: appears to be continent and emptying without issue   -  OOB to void as much as possible     LOS (Days) 23 A FACE TO FACE EVALUATION WAS PERFORMED  Lindley Magnus, MD 01/09/2018 6:54 AM

## 2018-01-10 ENCOUNTER — Inpatient Hospital Stay (HOSPITAL_COMMUNITY): Payer: Medicare Other

## 2018-01-10 LAB — GLUCOSE, CAPILLARY
GLUCOSE-CAPILLARY: 136 mg/dL — AB (ref 65–99)
GLUCOSE-CAPILLARY: 113 mg/dL — AB (ref 65–99)
Glucose-Capillary: 113 mg/dL — ABNORMAL HIGH (ref 65–99)
Glucose-Capillary: 127 mg/dL — ABNORMAL HIGH (ref 65–99)

## 2018-01-10 NOTE — Progress Notes (Addendum)
Physical Therapy Note  Patient Details  Name: Franklin Hicks MRN: [id number] Date of Birth: 1951-08-15 Today's Date: 01/10/2018  1415- 1612, 57 min individual tx Pain: " a little bit, both shoulders, r knee  W/c propulsion using bil UEs x 120' modified independent. PT switched armrests so that higher portion is toward front, for better pushing up with hands.  Seated exs: 15 x 1 each R/L heel raises, R/L long arc quad knee ext, bil glut sets, bil hip adduction for core activation.  Sit> stand to RW with max assist; pt stood x 1 minute during diagonal wt shifting L><R.  Sit> stand to RW, with L hand on RW, R hand on armrest, supervision, with wt shifting sufficient to bring L foot back and under him.  Lateral wt shifting R><L x 1 minute, followed by alternating R/L heel lifts x 10.   Pt left resting in w/c with all needs within reach. He declined quick release belt; PT informed Charisse March, Charity fundraiser.  See function navigator for current status.   Franklin Hicks 01/10/2018, 3:37 PM

## 2018-01-10 NOTE — Progress Notes (Signed)
  [place] PHYSICAL MEDICINE & REHABILITATION     PROGRESS NOTE    Subjective/Complaints: Feels well.  Has no complaints.  Eating well.  He feels therapy is going well.  Objective: Vital Signs: Blood pressure (!) 118/56, pulse 76, temperature 98.9 F (37.2 C), temperature source Oral, resp. rate 20, height 6' (1.829 m), weight (!) 358 lb (162.4 kg), SpO2 97 %.  Morbidly obese male in no acute distress.  HEENT exam atraumatic, normocephalic he is wearing a neck brace. Chest is clear to auscultation Cardiac exam S1-S2 regular Abdominal exam obese, active bowel sounds Extremities without edema   Assessment/Plan:  1. Functional deficits and tetraplegia secondary to incomplete C4 spinal cord injury    Medical Problem List and Plan:  1. Functional deficits secondary to C4 Spinal cord injury, motor and sensory incomplete   Discussed dc goals, needs.   -family ed  2. DVT/PE/Anticoagulation: xarelto long term 3. Pain Management: Denies pain. 4. Mood: LCSW to follow for evaluation and support.  5. Neuropsych: This patient is capable of making decisions on his own behalf.  6. Skin/Wound Care: continue pressure relief measures, appropriate nutrition Basic Metabolic Panel:    Component Value Date/Time   NA 141 01/04/2018 0454   K 4.2 01/04/2018 0454   CL 108 01/04/2018 0454   CO2 27 01/04/2018 0454   BUN 17 01/04/2018 0454   CREATININE 1.06 01/04/2018 0454   GLUCOSE 118 (H) 01/04/2018 0454   CALCIUM 8.7 (L) 01/04/2018 0454      8. T2DM: Monitor BS ac/hs and use SSI for elevated BS.   CBG (last 3)  Recent Labs    01/09/18 1633 01/09/18 2152 01/10/18 0642  GLUCAP 84 132* 113*   Controlled.  Continue current medications. 9. CAD: Managed with Lipitor, Plavix, and metoprolol  10. Morbid obesity: Body mass index is 51.76 kg/m.   Diet and exercise education ongoing   -po intake good. 11. Combined systolic/diastolic CHF: Monitor weights daily. Continue Lasix, Plavix, statin  and metoprolol.   Filed Weights   01/07/18 0523 01/08/18 0700 01/09/18 0449  Weight: (!) 353 lb 2.8 oz (160.2 kg) (!) 355 lb (161 kg) (!) 358 lb (162.4 kg)   Weight remains stable 5/17   12. HTN: Well-controlled. 13. H/o BPH: Reports voiding without difficulty--on Hytrin.  14. Bowel and bladder: appears to be continent and emptying without issue   -  OOB to void as much as possible     LOS (Days) 24 A FACE TO FACE EVALUATION WAS PERFORMED  Lindley Magnus, MD 01/10/2018 9:22 AM

## 2018-01-11 ENCOUNTER — Inpatient Hospital Stay (HOSPITAL_COMMUNITY): Payer: Medicare Other

## 2018-01-11 ENCOUNTER — Inpatient Hospital Stay (HOSPITAL_COMMUNITY): Payer: Medicare Other | Admitting: Physical Therapy

## 2018-01-11 LAB — CBC
HEMATOCRIT: 38.2 % — AB (ref 39.0–52.0)
HEMOGLOBIN: 11.8 g/dL — AB (ref 13.0–17.0)
MCH: 28.7 pg (ref 26.0–34.0)
MCHC: 30.9 g/dL (ref 30.0–36.0)
MCV: 92.9 fL (ref 78.0–100.0)
Platelets: 235 10*3/uL (ref 150–400)
RBC: 4.11 MIL/uL — ABNORMAL LOW (ref 4.22–5.81)
RDW: 14.2 % (ref 11.5–15.5)
WBC: 6.9 10*3/uL (ref 4.0–10.5)

## 2018-01-11 LAB — BASIC METABOLIC PANEL
ANION GAP: 9 (ref 5–15)
BUN: 14 mg/dL (ref 6–20)
CO2: 25 mmol/L (ref 22–32)
Calcium: 8.4 mg/dL — ABNORMAL LOW (ref 8.9–10.3)
Chloride: 105 mmol/L (ref 101–111)
Creatinine, Ser: 1.01 mg/dL (ref 0.61–1.24)
GFR calc Af Amer: >60 mL/min (ref >60)
GLUCOSE: 125 mg/dL — AB (ref 65–99)
POTASSIUM: 3.9 mmol/L (ref 3.5–5.1)
Sodium: 139 mmol/L (ref 135–145)

## 2018-01-11 LAB — GLUCOSE, CAPILLARY
GLUCOSE-CAPILLARY: 102 mg/dL — AB (ref 65–99)
Glucose-Capillary: 135 mg/dL — ABNORMAL HIGH (ref 65–99)
Glucose-Capillary: 120 mg/dL — ABNORMAL HIGH (ref 65–99)
Glucose-Capillary: 105 mg/dL — ABNORMAL HIGH (ref 65–99)

## 2018-01-11 NOTE — Progress Notes (Signed)
Physical Therapy Session Note  Patient Details  Name: Franklin Hicks MRN: [id number] Date of Birth: 1950-12-21  Today's Date: 01/11/2018 PT Individual Time: 1400-1525 PT Individual Time Calculation (min): 85 min   Short Term Goals: Week 4:  PT Short Term Goal 1 (Week 4): =LTGs due to ELOS  Skilled Therapeutic Interventions/Progress Updates: Pt received seated in w/c, denies pain and agreeable to treatment. Pt engaged in music therapy to self-selected music groups throughout session to improve engagement and motivation. W/c propulsion x100' with BUE for strengthening and endurance; slow speed. Lateral scoot transfer with transfer board to mat table with pt directing therapist with setup and assist needed, min question cueing. Sit <>stand x5 reps from mat table with S; assist needed to stabilize RW d/t technique used. Standing balance with alternating ball hits for focus on dynamic balance and standing tolerance; max standing tolerance of 3 min. Attempted heel raises but unable to perform correctly d/t strength deficits. Standing gastroc stretch BLE on small towel rolls x1 min. Seated hamstring stretch with 4" step and cues for technique; held each side x2 min. Semi-reclined situps x15 reps; second set x10 reps with 2# hand weight performing dynamic punches at top of situp for oblique activation and UE strengthening. Sit <>stand x4 reps with min guard/S as above, assist for stabilizing RW. Alternating toe taps to targets for focus on weight shifting and stance control; difficulty with stance on R to move L foot. Stand pivot to return to w/c with min guard/close S using RW, very slow speed. Returned to room with totalA for energy conservation. Remained seated in w/c at end of session, all needs in reach.      Therapy Documentation Precautions:  Precautions Precautions: Cervical Required Braces or Orthoses: Cervical Brace Cervical Brace: Hard collar Restrictions Weight Bearing Restrictions: No  See  Function Navigator for Current Functional Status.   Therapy/Group: Individual Therapy  Vista Lawman 01/11/2018, 3:25 PM

## 2018-01-11 NOTE — Progress Notes (Signed)
Physical Therapy Session Note  Patient Details  Name: Franklin Hicks MRN: [id number] Date of Birth: February 12, 1951  Today's Date: 01/11/2018 PT Individual Time: 1035-1130 PT Individual Time Calculation (min): 55 min   Short Term Goals: Week 4:  PT Short Term Goal 1 (Week 4): =LTGs due to ELOS  Skilled Therapeutic Interventions/Progress Updates:    Session focused d/c planning, transfer training with RW for stand pivot transfers, functional standing balance and endurance during dynamic reaching activities with 1 UE or no UE support, and zoom ball and kicking activity to address overall strengthening (UE, core, and BLE) to aid with mobility and transfers. Pt performed several sit <> stands with supervision to steadying assist and extra time with RW. Stand step transfers with RW with overall steadying assist for balance/safety and verbal cues for positioning of RW. During dynamic standing balance activities, able to perform at supervision level and tolerated several repetitions about 1.5 min each trial before needing seated rest break. Alternated between seated and standing activities to increase overall endurance and engaged in zoom ball and kicking ball with alternating BLE from seated position without UE support for both activities to increase challenge to core and balance. D/c planning and overall goals/progress discussed during therapeutic rest breaks. Pt making excellent progress.   Therapy Documentation Precautions:  Precautions Precautions: Cervical Required Braces or Orthoses: Cervical Brace Cervical Brace: Hard collar Restrictions Weight Bearing Restrictions: No  Pain:  No complaints.   See Function Navigator for Current Functional Status.   Therapy/Group: Individual Therapy  Karolee Stamps Darrol Poke, PT, DPT  01/11/2018, 12:10 PM

## 2018-01-11 NOTE — Progress Notes (Signed)
[place] PHYSICAL MEDICINE & REHABILITATION     PROGRESS NOTE    Subjective/Complaints: Up at eob. Just emptied bladder. Bowels working. No problems over weekend  ROS: Patient denies fever, rash, sore throat, blurred vision, nausea, vomiting, diarrhea, cough, shortness of breath or chest pain, joint or back pain, headache, or mood change.    Objective: Vital Signs: Blood pressure (!) 125/57, pulse 67, temperature 99.3 F (37.4 C), temperature source Oral, resp. rate 18, height 6' (1.829 m), weight (!) 162.8 kg (359 lb), SpO2 96 %. No results found. Recent Labs    01/11/18 0551  WBC 6.9  HGB 11.8*  HCT 38.2*  PLT 235   Recent Labs    01/11/18 0551  NA 139  K 3.9  CL 105  GLUCOSE 125*  BUN 14  CREATININE 1.01  CALCIUM 8.4*   CBG (last 3)  Recent Labs    01/10/18 1650 01/10/18 2206 01/11/18 0639  GLUCAP 113* 127* 135*    Wt Readings from Last 3 Encounters:  01/11/18 (!) 162.8 kg (359 lb)  12/15/17 (!) 173.1 kg (381 lb 9.9 oz)  09/24/17 (!) 160 kg (352 lb 12.8 oz)    Physical Exam:  Constitutional: No distress . Vital signs reviewed. HEENT: EOMI, oral membranes moist Neck: supple Cardiovascular: RRR without murmur. No JVD    Respiratory: CTA Bilaterally without wheezes or rales. Normal effort    GI: BS +, non-tender, non-distended  Musculoskeletal: left knee flexesto about 50 degrees in sitting.  Neurological: He is alert and oriented  Motor: LUE 3+ to 4 prox to 4/5 HI RUE 4/5 proximal to distal. 4/5HI  LLE:  3/5 HF, KE, lmited by knee.   4/5 ADF/PF RLE: 3/5 hip flexion, 3+/5 knee extension to 4/5 distally Skin: Skin is warm and dry.    Psychiatric: pleasant and cooperative.    Assessment/Plan:  1. Functional deficits and tetraplegia secondary to incomplete C4 spinal cord injury which require 3+ hours per day of interdisciplinary therapy in a comprehensive inpatient rehab setting. Physiatrist is providing close team supervision and 24 hour  management of active medical problems listed below. Physiatrist and rehab team continue to assess barriers to discharge/monitor patient progress toward functional and medical goals.  Function:  Bathing Bathing position Bathing activity did not occur: N/A(night bath) Position: Sitting EOB  Bathing parts Body parts bathed by patient: Right arm, Left arm, Chest, Abdomen, Front perineal area, Right upper leg, Left upper leg, Right lower leg, Left lower leg Body parts bathed by helper: Buttocks, Back  Bathing assist Assist Level: Touching or steadying assistance(Pt > 75%) Assistive Device Comment: LH sponge    Upper Body Dressing/Undressing Upper body dressing Upper body dressing/undressing activity did not occur: (preferred to work on UE strength & use today rather than bathe and dress again.   Stated he did so modifiied bathing on saturday or sunday) What is the patient wearing?: Pull over shirt/dress     Pull over shirt/dress - Perfomed by patient: Thread/unthread right sleeve, Thread/unthread left sleeve, Put head through opening, Pull shirt over trunk Pull over shirt/dress - Perfomed by helper: Pull shirt over trunk        Upper body assist Assist Level: Touching or steadying assistance(Pt > 75%)      Lower Body Dressing/Undressing Lower body dressing Lower body dressing/undressing activity did not occur: N/A What is the patient wearing?: Pants, Shoes     Pants- Performed by patient: Thread/unthread right pants leg, Thread/unthread left pants leg, Pull pants up/down Pants-  Performed by helper: Pull pants up/down Non-skid slipper socks- Performed by patient: Don/doff right sock, Don/doff left sock Non-skid slipper socks- Performed by helper: Don/doff right sock, Don/doff left sock     Shoes - Performed by patient: Don/doff right shoe, Don/doff left shoe(Slip on) Shoes - Performed by helper: Don/doff left shoe, Don/doff right shoe          Lower body assist Assist for lower  body dressing: Supervision or verbal cues Assistive Device Comment: Reacher Set up : To obtain clothing/put away  Toileting Toileting Toileting activity did not occur: No continent bowel/bladder event Toileting steps completed by patient: Adjust clothing prior to toileting, Adjust clothing after toileting Toileting steps completed by helper: Performs perineal hygiene    Toileting assist Assist level: Touching or steadying assistance (Pt.75%)   Transfers Chair/bed transfer Chair/bed transfer activity did not occur: Safety/medical concerns Chair/bed transfer method: Stand pivot Chair/bed transfer assist level: Touching or steadying assistance (Pt > 75%) Chair/bed transfer assistive device: Sliding board Mechanical lift: Stedy   Locomotion Ambulation Ambulation activity did not occur: Safety/medical Investment banker, operational activity did not occur: Safety/medical concerns Type: Manual Max wheelchair distance: 100 Assist Level: No help, No cues, assistive device, takes more than reasonable amount of time  Cognition Comprehension Comprehension assist level: Follows complex conversation/direction with no assist  Expression Expression assist level: Expresses complex ideas: With no assist  Social Interaction Social Interaction assist level: Interacts appropriately with others - No medications needed.  Problem Solving Problem solving assist level: Solves complex problems: Recognizes & self-corrects  Memory Memory assist level: Complete Independence: No helper  Medical Problem List and Plan:  1. Functional deficits secondary to C4 Spinal cord injury, motor and sensory incomplete   Discussed dc goals, needs. ELOS 5/22  -family ed  2. DVT/PE/Anticoagulation: xarelto long term 3. Pain Management: managed with prn medications.    -  gabapentin 100 mg 3 times daily for neuropathic pain   4. Mood: LCSW to follow for evaluation and support.  5. Neuropsych: This patient is capable  of making decisions on his own behalf.  6. Skin/Wound Care: continue pressure relief measures, appropriate nutrition  Prevalon boots   7. Fluids/Electrolytes/Nutrition: encourage PO   8. T2DM: Monitor BS ac/hs and use SSI for elevated BS.    Controlled on 5/20 9. CAD: Managed with Lipitor, Plavix, and metoprolol  10. Morbid obesity: Body mass index is 51.76 kg/m.   Diet and exercise education ongoing   -po intake good. 11. Combined systolic/diastolic CHF: Monitor weights daily. Continue Lasix, Plavix, statin and metoprolol.   Filed Weights   01/08/18 0700 01/09/18 0449 01/11/18 0528  Weight: (!) 161 kg (355 lb) (!) 162.4 kg (358 lb) (!) 162.8 kg (359 lb)   Weight remains stable 5/20   12. HTN: Monitor BP bid. Continue terazosin and furosemide.   Intermittent elevation.   -reasonable control 5/20 13. H/o BPH: Reports voiding without difficulty--on Hytrin.  14. Bowel and bladder: appears to be continent and emptying without issue   -  OOB to void as much as possible     LOS (Days) 25 A FACE TO FACE EVALUATION WAS PERFORMED  Ranelle Oyster, MD 01/11/2018 9:29 AM

## 2018-01-11 NOTE — Plan of Care (Signed)
  Problem: Consults Goal: RH SPINAL CORD INJURY PATIENT EDUCATION Description  See Patient Education module for education specifics.  Outcome: Progressing   Problem: SCI BOWEL ELIMINATION Goal: RH STG MANAGE BOWEL WITH ASSISTANCE Description STG Manage Bowel with min Assistance.  Outcome: Progressing   Problem: SCI BLADDER ELIMINATION Goal: RH STG MANAGE BLADDER WITH ASSISTANCE Description STG Manage Bladder With min Assistance  Outcome: Progressing   Problem: RH SKIN INTEGRITY Goal: RH STG SKIN FREE OF INFECTION/BREAKDOWN Description No new breakdown with min assist   Outcome: Progressing Goal: RH STG ABLE TO PERFORM INCISION/WOUND CARE W/ASSISTANCE Description STG Able To Perform Incision/Wound Care With Min Assistance.  Outcome: Progressing   Problem: RH SAFETY Goal: RH STG ADHERE TO SAFETY PRECAUTIONS W/ASSISTANCE/DEVICE Description STG Adhere to Safety Precautions With min Assistance/Device.  Outcome: Progressing   Problem: RH PAIN MANAGEMENT Goal: RH STG PAIN MANAGED AT OR BELOW PT'S PAIN GOAL Description <3 out of 10.   Outcome: Progressing   

## 2018-01-11 NOTE — Progress Notes (Signed)
Occupational Therapy Session Note  Patient Details  Name: Franklin Hicks MRN: [id number] Date of Birth: [birth year]  Today's Date: 01/11/2018 OT Individual Time: 0800-0900 OT Individual Time Calculation (min): 60 min    Short Term Goals: Week 3:  OT Short Term Goal 1 (Week 3): Pt will pull up pants with mod A standing in bariatric Stedy OT Short Term Goal 1 - Progress (Week 3): Discontinued (comment) OT Short Term Goal 2 (Week 3): Pt will be able to tolerate standing in a stedy fully upright for at least 2 min. OT Short Term Goal 2 - Progress (Week 3): Discontinued (comment) OT Short Term Goal 3 (Week 3): Pt will complete LB bathing tasks seated in shower with min A OT Short Term Goal 3 - Progress (Week 3): Met  Skilled Therapeutic Interventions/Progress Updates:    OT intervention with focus on bed mobility, bathing/dressing EOB, and slide board transfers.  Pt resting in bed upon arrival and sat EOB at supervision level.  Pt completed UB bathing tasks without assistance but required assistance with LB bathing tasks.  Pt required assistance donning shirt over head.  Educated pt with donning looser sitting clothing to facilitate independence with dressing tasks.  Pt able to don shorts and pull over hips with lateral leans seated EOB.  Pt performed slide board transfer with supervision.  Pt completed grooming tasks seated in w/c at sink. Pt remained seated in w/c with all needs within reach.   Therapy Documentation Precautions:  Precautions Precautions: Cervical Required Braces or Orthoses: Cervical Brace Cervical Brace: Hard collar Restrictions Weight Bearing Restrictions: No  Pain:      See Function Navigator for Current Functional Status.   Therapy/Group: Individual Therapy  Leroy Libman 01/11/2018, 12:06 PM

## 2018-01-12 ENCOUNTER — Inpatient Hospital Stay (HOSPITAL_COMMUNITY): Payer: Medicare Other | Admitting: Physical Therapy

## 2018-01-12 ENCOUNTER — Inpatient Hospital Stay (HOSPITAL_COMMUNITY): Payer: Medicare Other

## 2018-01-12 LAB — GLUCOSE, CAPILLARY
Glucose-Capillary: 113 mg/dL — ABNORMAL HIGH (ref 65–99)
Glucose-Capillary: 112 mg/dL — ABNORMAL HIGH (ref 65–99)
Glucose-Capillary: 88 mg/dL (ref 65–99)
Glucose-Capillary: 114 mg/dL — ABNORMAL HIGH (ref 65–99)

## 2018-01-12 NOTE — Progress Notes (Signed)
Physical Therapy Discharge Summary  Patient Details  Name: Franklin Hicks MRN: [id number] Date of Birth: [birth year]  Today's Date: 01/13/2018 PT Individual Time: 1240-1330 PT Individual Time Calculation (min): 50 min    Patient has met 10 of 10 long term goals due to improved activity tolerance, improved balance, improved postural control, increased strength and functional use of  right upper extremity, right lower extremity, left upper extremity and left lower extremity.  Patient to discharge at a wheelchair level supervision/min assist.   Patient's care partner is independent to provide the necessary physical assistance at discharge.  Have discussed safety concerns with pt completing stand/pivot transfers and pt agreeable.  Assisted pt to car with min assist for stand/pivot and mod to lift LEs into car.    Recommendation:  Patient will benefit from ongoing skilled PT services in home health setting to continue to advance safe functional mobility, address ongoing impairments in balance, strength, and activity tolerance, and minimize fall risk.  Equipment: hospital bed, RW, slide board, w/c  Reasons for discharge: treatment goals met  Patient/family agrees with progress made and goals achieved: Yes  PT Discharge Precautions/Restrictions Precautions Precautions: Cervical Required Braces or Orthoses: Cervical Brace Cervical Brace: Hard collar;At all times Restrictions Weight Bearing Restrictions: No Vital Signs  Pain Pain Assessment Pain Scale: 0-10 Pain Score: 3  Pain Location: Shoulder Pain Orientation: Right Pain Descriptors / Indicators: Other (Comment)(stiff) Pain Intervention(s): Emotional support;Other (Comment)(educated on stretching at end of the day) Vision/Perception  Perception Perception: Within Functional Limits Praxis Praxis: Intact  Cognition Overall Cognitive Status: Within Functional Limits for tasks assessed Arousal/Alertness: Awake/alert Orientation  Level: Oriented X4 Memory: Appears intact Awareness: Appears intact Problem Solving: Appears intact Safety/Judgment: Appears intact Sensation Sensation Light Touch: Appears Intact(LEs) Hot/Cold: Appears Intact Proprioception: Appears Intact Coordination Gross Motor Movements are Fluid and Coordinated: No Fine Motor Movements are Fluid and Coordinated: No Finger Nose Finger Test: not tested due to impaired shoulder strength Heel Shin Test: unable 2/2 baseline L knee impairment  Motor  Motor Motor: Tetraplegia Motor - Discharge Observations: strength and mobility in BUEs/BLEs improving from evaluation  Mobility Bed Mobility Bed Mobility: Sit to Supine;Supine to Sit Supine to Sit: 5: Supervision;With rails;HOB elevated Sit to Supine: 5: Supervision;With rail;HOB elevated Transfers Transfers: Yes Stand Pivot Transfers: 4: Min guard;With armrests Stand Pivot Transfer Details: Verbal cues for technique;Verbal cues for precautions/safety Lateral/Scoot Transfers: 5: Supervision;With slide board Lateral/Scoot Transfer Details (indicate cue type and reason): assist for placement of slide board Locomotion     Trunk/Postural Assessment  Cervical Assessment Cervical Assessment: (cervical collar) Thoracic Assessment Thoracic Assessment: Within Functional Limits Lumbar Assessment Lumbar Assessment: Within Functional Limits Postural Control Postural Control: Within Functional Limits  Balance Balance Balance Assessed: Yes Static Sitting Balance Static Sitting - Level of Assistance: 6: Modified independent (Device/Increase time) Dynamic Sitting Balance Dynamic Sitting - Level of Assistance: 5: Stand by assistance Static Standing Balance Static Standing - Level of Assistance: 5: Stand by assistance Extremity Assessment      RLE Assessment RLE Assessment: Within Functional Limits LLE Assessment LLE Assessment: Exceptions to [facility]   See Function Navigator for Current Functional  Status.  Michel Santee 01/12/2018, 10:50 AM

## 2018-01-12 NOTE — Patient Care Conference (Signed)
Inpatient RehabilitationTeam Conference and Plan of Care Update Date: 01/12/2018   Time: 2:00 PM    Patient Name: Franklin Hicks      Medical Record Number: [id number]  Date of Birth: 1950-09-13 Sex: Male         Room/Bed: 4W16C/4W16C-01 Payor Info: Payor: Advertising copywriter MEDICARE / Plan: [employer] MEDICARE / Product Type: *No Product type* /    Admitting Diagnosis: SCI  Admit Date/Time:  12/17/2017  5:28 PM Admission Comments: No comment available   Primary Diagnosis:  C4 spinal cord injury, sequela (HCC) Principal Problem: C4 spinal cord injury, sequela [facility])  Patient Active Problem List   Diagnosis Date Noted  . Pain of left heel   . Diabetes mellitus type 2 in obese (HCC)   . C4 spinal cord injury, sequela (HCC) 12/18/2017  . Tetraplegia (HCC) 12/18/2017  . Acute pulmonary embolism (HCC) 12/18/2017  . Aortic aneurysm (HCC) 12/16/2017  . Fall 12/13/2017  . Cord compression (HCC) 12/13/2017  . Abnormal CT scan, cervical spine 12/13/2017  . Chronic combined systolic and diastolic heart failure (HCC) 10/09/2016  . CAD S/P percutaneous coronary angioplasty 09/20/2016  . H/O non-ST elevation myocardial infarction (NSTEMI) 09/20/2016  . Erectile dysfunction 07/30/2015  . Morbid obesity with BMI of 50.0-59.9, adult (HCC) 07/18/2013  . History of pulmonary embolism 07/13/2013  . Angioedema of lips 07/13/2013  . Multinodular goiter 07/13/2013  . Dyslipidemia, goal LDL below 70 10/02/2011  . SOB (shortness of breath) 10/02/2011  . BPH with urinary obstruction 07/17/2010  . Diabetes mellitus type 2, controlled (HCC) 07/17/2010  . Bilateral lower extremity edema 12/08/2008  . NEUROPATHY, IDIOPATHIC PERIPHERAL NEC 05/31/2007  . Osteoarthritis 03/22/2007  . Gout 03/15/2007  . Essential hypertension 03/15/2007    Expected Discharge Date: Expected Discharge Date: 01/13/18  Team Members Present: Physician leading conference: Dr. Faith Rogue Social Worker Present: Amada Jupiter, LCSW Nurse  Present: Other (comment)(Katelyn Julien Girt, RN) PT Present: Teodoro Kil, PT OT Present: Callie Fielding, OT;Ardis Rowan, COTA SLP Present: Colin Benton, SLP PPS Coordinator present : Edson Snowball, PT     Current Status/Progress Goal Weekly Team Focus  Medical   Patient showing continued improvement in his motor skills.  Left knee  Finalize medical plan for discharge  Continue glucose and blood pressure control and management.  Monitoring weights as well as ins and outs   Bowel/Bladder   continent of B/B  Maintain continence  Assist with toileting as needed   Swallow/Nutrition/ Hydration             ADL's   bathing-EOB/bed level-min A; dressing-min A with AE, sliding board transfers-supervision, static standing-min A with RW  min A overall  education, functional transfers, activity tolerance   Mobility   supervision for all mobility, occasional min guard on stand/pivot transfers with RW  supervision for transfers, will d/c gait, supervision/mod I for w/c mobility  d/c planning   Communication             Safety/Cognition/ Behavioral Observations            Pain   Denies pain  < 2  Assess Qshift/prn   Skin   Bilateral lower extremity edema  Prevent skin breakdown  Assess qshift and PRN    Rehab Goals Patient on target to meet rehab goals: Yes *See Care Plan and progress notes for long and short-term goals.     Barriers to Discharge  Current Status/Progress Possible Resolutions Date Resolved   Physician    Medical stability  Patient medically ready for discharge      Nursing                  PT                    OT                  SLP                SW                Discharge Planning/Teaching Needs:  Home with wife and other family members providing 24/7 assistance.   All family education has been completed.  PT to continue car transfer ed with d/c.   Team Discussion:  Team all report pt ready for d/c tomorrow.  Family ed completed.  No  concerns  Revisions to Treatment Plan:  NA    Continued Need for Acute Rehabilitation Level of Care: The patient requires daily medical management by a physician with specialized training in physical medicine and rehabilitation for the following conditions: Daily direction of a multidisciplinary physical rehabilitation program to ensure safe treatment while eliciting the highest outcome that is of practical value to the patient.: Yes Daily medical management of patient stability for increased activity during participation in an intensive rehabilitation regime.: Yes Daily analysis of laboratory values and/or radiology reports with any subsequent need for medication adjustment of medical intervention for : Neurological problems;Post surgical problems  Zykiria Bruening 01/12/2018, 4:43 PM

## 2018-01-12 NOTE — Progress Notes (Signed)
Occupational Therapy Note  Patient Details  Name: REEGAN BOUFFARD MRN: [id number] Date of Birth: [birth year]  Today's Date: 01/12/2018 OT Individual Time: 1100-1200 OT Individual Time Calculation (min): 60 min   Pt denies pain but c/o B shoulder discomfort and fatigue later in the day; RN aware Individual Therapy  OT intervention with focus on discharge planning, education, and reapplication of Kinesio Tape to B shoulders/traps and medial border of scapulae in addition to L knee.  Discussed recommendations for toileting aides and issued toilet aide.  Pt able to return demonstrate.  Recommended use of flushable wipes.  Recommended purchasing bed pads for use at home.  Pt remained in w/c with all needs within reach.  Pt ready for discharge home and apprehensive but agreeable that discharge home is appropriate.   Lavone Neri [facility] 01/12/2018, 12:21 PM

## 2018-01-12 NOTE — Plan of Care (Signed)
  Problem: Consults Goal: RH SPINAL CORD INJURY PATIENT EDUCATION Description  See Patient Education module for education specifics.  Outcome: Progressing   Problem: SCI BOWEL ELIMINATION Goal: RH STG MANAGE BOWEL WITH ASSISTANCE Description STG Manage Bowel with min Assistance.  Outcome: Progressing   Problem: SCI BLADDER ELIMINATION Goal: RH STG MANAGE BLADDER WITH ASSISTANCE Description STG Manage Bladder With min Assistance  Outcome: Progressing   Problem: RH SKIN INTEGRITY Goal: RH STG SKIN FREE OF INFECTION/BREAKDOWN Description No new breakdown with min assist   Outcome: Progressing Goal: RH STG ABLE TO PERFORM INCISION/WOUND CARE W/ASSISTANCE Description STG Able To Perform Incision/Wound Care With [employer].  Outcome: Progressing   Problem: RH SAFETY Goal: RH STG ADHERE TO SAFETY PRECAUTIONS W/ASSISTANCE/DEVICE Description STG Adhere to Safety Precautions With min Assistance/Device.  Outcome: Progressing   Problem: RH PAIN MANAGEMENT Goal: RH STG PAIN MANAGED AT OR BELOW PT'S PAIN GOAL Description <3 out of 10.   Outcome: Progressing

## 2018-01-12 NOTE — Progress Notes (Signed)
Social Work Patient ID: Franklin Hicks, male   DOB: 1951-01-24, 67 y.o.   MRN: [id number]  Met with pt and wife this afternoon to discuss tomorrow's discharge.  Answered questions about DME delivery and HH follow up.  Both report they are feeling ready for d/c.  I will see them again in there morning prior to leaving to ensure all DME has been delivered.   Jilliam Bellmore, LCSW

## 2018-01-12 NOTE — Progress Notes (Signed)
Occupational Therapy Session Note  Patient Details  Name: Franklin Hicks MRN: [id number] Date of Birth: 09-Jan-1951  Today's Date: 01/12/2018 OT Individual Time: 0800-0930 OT Individual Time Calculation (min): 90 min    Short Term Goals: Week 4:  OT Short Term Goal 1 (Week 4): STG=LTG due to LOS  Skilled Therapeutic Interventions/Progress Updates:    OT intervention with focus ongoing BADL retraining including bathing at shower level and dressing at EOB.  Pt used Stedy for transfer to shower rolling shower chair.  Pt completed bathing tasks seated with min A for buttocks.  Pt completed dressing tasks with min A seated EOB using lateral leans to pull pants up over hips.  Pt completed sliding board transfer to w/c with supervision. Pt completed eating breakfast without assistance.  Pt completed grooming tasks seated at sink without assistance. Continued with ongoing discharge planning and education.  Pt remained in w/c with all needs within reach.   Therapy Documentation Precautions:  Precautions Precautions: Cervical Required Braces or Orthoses: Cervical Brace Cervical Brace: Hard collar, At all times Restrictions Weight Bearing Restrictions: No  Pain: Pain Assessment Pain Scale: 0-10 Pain Score: 3  Pain Location: Shoulder Pain Orientation: Right Pain Descriptors / Indicators: Other (Comment)(stiff) Pain Intervention(s): Emotional support;Other (Comment)(educated on stretching at end of the day)    See Function Navigator for Current Functional Status.   Therapy/Group: Individual Therapy  Rich Brave 01/12/2018, 12:20 PM

## 2018-01-12 NOTE — Progress Notes (Signed)
[facility] PHYSICAL MEDICINE & REHABILITATION     PROGRESS NOTE    Subjective/Complaints: No new complaints. A little apprehensive about discharge and set upt/family ed  ROS: Patient denies fever, rash, sore throat, blurred vision, nausea, vomiting, diarrhea, cough, shortness of breath or chest pain, joint or back pain, headache, or mood change.   Objective: Vital Signs: Blood pressure (!) 115/55, pulse 89, temperature 98.9 F (37.2 C), temperature source Oral, resp. rate 18, height 6' (1.829 m), weight (!) 162.4 kg (358 lb), SpO2 97 %. No results found. Recent Labs    01/11/18 0551  WBC 6.9  HGB 11.8*  HCT 38.2*  PLT 235   Recent Labs    01/11/18 0551  NA 139  K 3.9  CL 105  GLUCOSE 125*  BUN 14  CREATININE 1.01  CALCIUM 8.4*   CBG (last 3)  Recent Labs    01/11/18 1636 01/11/18 2137 01/12/18 0702  GLUCAP 120* 105* 113*    Wt Readings from Last 3 Encounters:  01/12/18 (!) 162.4 kg (358 lb)  12/15/17 (!) 173.1 kg (381 lb 9.9 oz)  09/24/17 (!) 160 kg (352 lb 12.8 oz)    Physical Exam:  Constitutional: No distress . Vital signs reviewed. obese HEENT: EOMI, oral membranes moist Neck: supple Cardiovascular: RRR without murmur. No JVD    Respiratory: CTA Bilaterally without wheezes or rales. Normal effort    GI: BS +, non-tender, non-distended  Musculoskeletal: left knee flexesto about 50 degrees in sitting.  Neurological: He is alert and oriented  Motor: LUE  4 prox to 4/5 HI RUE 4/5 proximal to distal. 4/5HI  LLE:  3/5 HF, KE, lmited by knee.   4/5 ADF/PF RLE: 3/5 hip flexion, 3+/5 knee extension to 4/5 distally Skin: Skin is warm and dry.    Psychiatric: pleasant and cooperative.    Assessment/Plan:  1. Functional deficits and tetraplegia secondary to incomplete C4 spinal cord injury which require 3+ hours per day of interdisciplinary therapy in a comprehensive inpatient rehab setting. Physiatrist is providing close team supervision and 24 hour  management of active medical problems listed below. Physiatrist and rehab team continue to assess barriers to discharge/monitor patient progress toward functional and medical goals.  Function:  Bathing Bathing position Bathing activity did not occur: N/A(night bath) Position: Sitting EOB  Bathing parts Body parts bathed by patient: Right arm, Left arm, Chest, Abdomen, Front perineal area, Right upper leg, Left upper leg, Right lower leg, Left lower leg Body parts bathed by helper: Buttocks, Back  Bathing assist Assist Level: Touching or steadying assistance(Pt > 75%) Assistive Device Comment: LH sponge    Upper Body Dressing/Undressing Upper body dressing Upper body dressing/undressing activity did not occur: (preferred to work on UE strength & use today rather than bathe and dress again.   Stated he did so modifiied bathing on saturday or sunday) What is the patient wearing?: Pull over shirt/dress     Pull over shirt/dress - Perfomed by patient: Thread/unthread right sleeve, Thread/unthread left sleeve, Put head through opening, Pull shirt over trunk Pull over shirt/dress - Perfomed by helper: Pull shirt over trunk        Upper body assist Assist Level: Touching or steadying assistance(Pt > 75%)      Lower Body Dressing/Undressing Lower body dressing Lower body dressing/undressing activity did not occur: N/A What is the patient wearing?: Pants, Shoes     Pants- Performed by patient: Thread/unthread right pants leg, Thread/unthread left pants leg, Pull pants up/down Pants- Performed  by helper: Pull pants up/down Non-skid slipper socks- Performed by patient: Don/doff right sock, Don/doff left sock Non-skid slipper socks- Performed by helper: Don/doff right sock, Don/doff left sock     Shoes - Performed by patient: Don/doff right shoe, Don/doff left shoe(Slip on) Shoes - Performed by helper: Don/doff left shoe, Don/doff right shoe          Lower body assist Assist for lower  body dressing: Supervision or verbal cues Assistive Device Comment: Reacher Set up : To obtain clothing/put away  Toileting Toileting Toileting activity did not occur: No continent bowel/bladder event Toileting steps completed by patient: Adjust clothing prior to toileting, Adjust clothing after toileting Toileting steps completed by helper: Performs perineal hygiene    Toileting assist Assist level: Touching or steadying assistance (Pt.75%)   Transfers Chair/bed transfer Chair/bed transfer activity did not occur: Safety/medical concerns Chair/bed transfer method: Stand pivot Chair/bed transfer assist level: Touching or steadying assistance (Pt > 75%) Chair/bed transfer assistive device: Armrests, Walker Mechanical lift: Magazine features editor activity did not occur: Safety/medical Investment banker, operational activity did not occur: Safety/medical concerns Type: Manual Max wheelchair distance: 100 Assist Level: No help, No cues, assistive device, takes more than reasonable amount of time  Cognition Comprehension Comprehension assist level: Follows complex conversation/direction with no assist  Expression Expression assist level: Expresses complex ideas: With no assist  Social Interaction Social Interaction assist level: Interacts appropriately with others - No medications needed.  Problem Solving Problem solving assist level: Solves complex problems: Recognizes & self-corrects  Memory Memory assist level: Complete Independence: No helper  Medical Problem List and Plan:  1. Functional deficits secondary to C4 Spinal cord injury, motor and sensory incomplete   Discussed dc goals, needs. ELOS 5/22  -family ed, finalize dc planning 2. DVT/PE/Anticoagulation: xarelto long term 3. Pain Management: managed with prn medications.    -  gabapentin 100 mg 3 times daily for neuropathic pain   4. Mood: LCSW to follow for evaluation and support.  5. Neuropsych:  This patient is capable of making decisions on his own behalf.  6. Skin/Wound Care: continue pressure relief measures, appropriate nutrition  Prevalon boots---can wear PRN at home 7. Fluids/Electrolytes/Nutrition: encourage PO   8. T2DM: Monitor BS ac/hs and use SSI for elevated BS.    Controlled on 5/21 9. CAD: Managed with Lipitor, Plavix, and metoprolol  10. Morbid obesity: Body mass index is 51.76 kg/m.   Diet and exercise education ongoing   -po intake good. 11. Combined systolic/diastolic CHF: Monitor weights daily. Continue Lasix, Plavix, statin and metoprolol.   Filed Weights   01/09/18 0449 01/11/18 0528 01/12/18 0412  Weight: (!) 162.4 kg (358 lb) (!) 162.8 kg (359 lb) (!) 162.4 kg (358 lb)   Weight remains stable 5/21   12. HTN: Monitor BP bid. Continue terazosin and furosemide.   Intermittent elevation.   -reasonable control 5/21 13. H/o BPH: Reports voiding without difficulty--on Hytrin.  14. Bowel and bladder: appears to be continent and emptying without issue   -  OOB to void as much as possible     LOS (Days) 26 A FACE TO FACE EVALUATION WAS PERFORMED  Ranelle Oyster, MD 01/12/2018 9:03 AM

## 2018-01-12 NOTE — Progress Notes (Signed)
Physical Therapy Session Note  Patient Details  Name: Franklin Hicks MRN: [id number] Date of Birth: 20-Feb-1951  Today's Date: 01/12/2018 PT Individual Time: 1000-1100 PT Individual Time Calculation (min): 60 min   Short Term Goals: Week 4:  PT Short Term Goal 1 (Week 4): =LTGs due to ELOS  Skilled Therapeutic Interventions/Progress Updates:    Session focus on activity tolerance via w/c mobility, transfers, and gait, and d/c planning for DME at home.    Throughout session reviewed DME to be used at home and d/c plan.  Pt propels w/c with BUEs and increased effort x100' with mod I.  Stand/pivot from w/c to therapy mat with RW, increased time, and min guard, heavy reliance on UEs on RW and verbal cues for upright posture to improve ability to weight shift.  Pt completes slide board transfer back to w/c with set up for board placement.  Sit<>stand from w/c with RW and min guard.  Gait x3 steps each LE with min +2 and w/c follow for safety.  Discussed use of KO trial with therapy and standing once home to reduce buckling and improve pt confidence with mobility.  Will plan to see pt on day of d/c for car transfer.  Returned to room, handoff to OT.   Therapy Documentation Precautions:  Precautions Precautions: Cervical Required Braces or Orthoses: Cervical Brace Cervical Brace: Hard collar, At all times Restrictions Weight Bearing Restrictions: No   See Function Navigator for Current Functional Status.   Therapy/Group: Individual Therapy  Stephania Fragmin 01/12/2018, 2:13 PM

## 2018-01-12 NOTE — Progress Notes (Signed)
Occupational Therapy Discharge Summary  Patient Details  Name: Franklin Hicks MRN: [id number] Date of Birth: 04/02/1951   Patient has met 10 of 10 long term goals due to improved activity tolerance, improved balance, postural control, ability to compensate for deficits, functional use of  RIGHT upper and LEFT upper extremity and improved coordination. Pt made steady progress with BADLs and functional transfers to drop arm BSC during this admission.  Pt completes bathing/dressing tasks at EOB with min A.  Pt completes sliding board transfers with supervision.  Pt requires max A for toileting tasks using a toilet aide. Family has been present and assisted with sliding board transfers.  Patient to discharge at overall Min Assist level.  Patient's care partner is independent to provide the necessary physical assistance at discharge.     Recommendation:  Patient will benefit from ongoing skilled OT services in home health setting to continue to advance functional skills in the area of BADL and Reduce care partner burden.  Equipment: drop arm BSC  Reasons for discharge: treatment goals met and discharge from hospital  Patient/family agrees with progress made and goals achieved: Yes  OT Discharge ADL ADL ADL Comments: refer to functional navigator Vision Baseline Vision/History: Wears glasses Wears Glasses: At all times Patient Visual Report: No change from baseline Vision Assessment?: No apparent visual deficits Perception  Perception: Within Functional Limits Praxis Praxis: Intact Cognition Overall Cognitive Status: Within Functional Limits for tasks assessed Arousal/Alertness: Awake/alert Orientation Level: Oriented X4 Attention: Alternating Alternating Attention: Appears intact Memory: Appears intact Awareness: Appears intact Problem Solving: Appears intact Safety/Judgment: Appears intact Sensation Sensation Light Touch: Appears Intact Stereognosis: Impaired by gross  assessment Hot/Cold: Appears Intact Proprioception: Appears Intact Coordination Gross Motor Movements are Fluid and Coordinated: No Fine Motor Movements are Fluid and Coordinated: No Finger Nose Finger Test: not tested due to impaired shoulder strength Heel Shin Test: unable 2/2 baseline L knee impairment  Motor  Motor Motor: Tetraplegia Motor - Discharge Observations: strength and mobility in BUEs/BLEs improving from evaluation Mobility  Bed Mobility Bed Mobility: Sit to Supine;Supine to Sit Supine to Sit: 5: Supervision;With rails;HOB elevated Sit to Supine: 5: Supervision;With rail;HOB elevated  Trunk/Postural Assessment  Cervical Assessment Cervical Assessment: Exceptions to WFL(cervical collar) Thoracic Assessment Thoracic Assessment: Within Functional Limits Lumbar Assessment Lumbar Assessment: Within Functional Limits Postural Control Postural Control: Within Functional Limits  Balance Balance Balance Assessed: Yes Static Sitting Balance Static Sitting - Level of Assistance: 6: Modified independent (Device/Increase time) Dynamic Sitting Balance Dynamic Sitting - Level of Assistance: 5: Stand by assistance Static Standing Balance Static Standing - Level of Assistance: 5: Stand by assistance Extremity/Trunk Assessment RUE Assessment RUE Assessment: Exceptions to WFL RUE AROM (degrees) Right Shoulder Flexion: 100 Degrees RUE Strength Right Shoulder Flexion: 2+/5 Right Elbow Flexion: 4-/5 Right Elbow Extension: 4-/5 Right Hand Gross Grasp: Impaired(4-/5) LUE Assessment LUE Assessment: Exceptions to WFL LUE AROM (degrees) Left Shoulder Flexion: 80 Degrees LUE Strength Left Elbow Flexion: 4/5 Left Elbow Extension: 4/5 Left Hand Gross Grasp: Impaired(3+/5)   See Function Navigator for Current Functional Status.  Lanier, Thomas Chappell 01/12/2018, 12:29 PM 

## 2018-01-13 ENCOUNTER — Inpatient Hospital Stay (HOSPITAL_COMMUNITY): Payer: Medicare Other | Admitting: Physical Therapy

## 2018-01-13 LAB — GLUCOSE, CAPILLARY: Glucose-Capillary: 109 mg/dL — ABNORMAL HIGH (ref 65–99)

## 2018-01-13 MED ORDER — ACETAMINOPHEN 325 MG PO TABS: 325.0000 mg | ORAL_TABLET | ORAL | Status: DC | PRN

## 2018-01-13 MED ORDER — RIVAROXABAN 20 MG PO TABS: 20.0000 mg | ORAL_TABLET | Freq: Every day | ORAL | 1 refills | Status: DC

## 2018-01-13 MED ORDER — TERAZOSIN HCL 10 MG PO CAPS: 10.0000 mg | ORAL_CAPSULE | Freq: Every day | ORAL | 0 refills | Status: DC

## 2018-01-13 MED ORDER — ATORVASTATIN CALCIUM 80 MG PO TABS: 80.0000 mg | ORAL_TABLET | Freq: Every day | ORAL | 0 refills | Status: DC

## 2018-01-13 MED ORDER — ADULT MULTIVITAMIN W/MINERALS CH: 1.0000 | ORAL_TABLET | Freq: Every day | ORAL | Status: AC

## 2018-01-13 MED ORDER — DOCUSATE SODIUM 100 MG PO CAPS: 200.0000 mg | ORAL_CAPSULE | Freq: Every day | ORAL | 0 refills | Status: DC

## 2018-01-13 MED ORDER — GABAPENTIN 100 MG PO CAPS: 100.0000 mg | ORAL_CAPSULE | Freq: Three times a day (TID) | ORAL | 0 refills | Status: DC

## 2018-01-13 MED ORDER — METOPROLOL TARTRATE 50 MG PO TABS: 50.0000 mg | ORAL_TABLET | Freq: Two times a day (BID) | ORAL | 0 refills | Status: DC

## 2018-01-13 MED ORDER — VITAMIN B-12 100 MCG PO TABS: 100.0000 ug | ORAL_TABLET | Freq: Every day | ORAL | Status: DC

## 2018-01-13 MED ORDER — CLOPIDOGREL BISULFATE 75 MG PO TABS: 75.0000 mg | ORAL_TABLET | Freq: Every day | ORAL | 0 refills | Status: DC

## 2018-01-13 MED ORDER — FUROSEMIDE 20 MG PO TABS: 20.0000 mg | ORAL_TABLET | ORAL | 3 refills | Status: DC

## 2018-01-13 MED ORDER — POTASSIUM CITRATE ER 10 MEQ (1080 MG) PO TBCR: 10.0000 meq | EXTENDED_RELEASE_TABLET | Freq: Three times a day (TID) | ORAL | 0 refills | Status: DC

## 2018-01-13 NOTE — Progress Notes (Signed)
Pt left unit via wheelchair by NT and family. Discharge instructions given by PA. All questions/concerns addressed. Belongings and discharge instructions in tow. Ronnette Juniper, LPN

## 2018-01-13 NOTE — Discharge Summary (Signed)
Physician Discharge Summary  Patient ID: Franklin Hicks MRN: [id number] DOB/AGE: 1951/02/19 67 y.o.  Admit date: 12/17/2017 Discharge date: 01/13/2018  Discharge Diagnoses:  Principal Problem:   C4 spinal cord injury, sequela (HCC) Active Problems:   Diabetes mellitus type 2, controlled (HCC)   Chronic combined systolic and diastolic heart failure (HCC)   Tetraplegia (HCC)   Acute pulmonary embolism (HCC)   Pain of left heel   Diabetes mellitus type 2 in obese [facility])   Discharged Condition: stable   Significant Diagnostic Studies: Ct Angio Chest Pe W Or Wo Contrast  Addendum Date: 12/15/2017   ADDENDUM REPORT: 12/15/2017 21:28 ADDENDUM: These results were called by telephone at the time of interpretation on 12/15/2017 at 9:28 pm to Dr. Craige Cotta, who verbally acknowledged these results. Electronically Signed   By: Mitzi Hansen M.D.   On: 12/15/2017 21:28   Result Date: 12/15/2017 CLINICAL DATA:  67 y/o M; shortness of breath, PE suspected, intermediate probability, positive D-dimer. EXAM: CT ANGIOGRAPHY CHEST WITH CONTRAST TECHNIQUE: Multidetector CT imaging of the chest was performed using the standard protocol during bolus administration of intravenous contrast. Multiplanar CT image reconstructions and MIPs were obtained to evaluate the vascular anatomy. CONTRAST:  ISOVUE-370 IOPAMIDOL (ISOVUE-370) INJECTION 76% COMPARISON:  09/18/2016 CTA of the chest. FINDINGS: Cardiovascular: 4.0 cm ascending aorta. Mild calcific atherosclerosis of the aortic arch. Normal heart size. No pericardial effusion. Moderate coronary artery calcific atherosclerosis. Right main pulmonary artery acute embolus extending into multiple segmental arteries. RV/LV ratio equals 0.66. Mediastinum/Nodes: No enlarged mediastinal, hilar, or axillary lymph nodes. Thyroid gland, trachea, and esophagus demonstrate no significant findings. Lungs/Pleura: Lungs are clear. No pleural effusion or pneumothorax. Upper  Abdomen: No acute abnormality. Musculoskeletal: Moderate osteoarthrosis of the glenohumeral joint with fibrocystic degeneration of the articular surfaces. Review of the MIP images confirms the above findings. IMPRESSION: 1. Acute pulmonary embolus in right main pulmonary artery extending in the multiple segmental arteries. RV/LV ratio = 0.66. 2. Stable 4.0 cm ascending aortic aneurysm. Recommend annual imaging followup by CTA or MRA. This recommendation follows 2010 ACCF/AHA/AATS/ACR/ASA/SCA/SCAI/SIR/STS/SVM Guidelines for the Diagnosis and Management of Patients with Thoracic Aortic Disease. Circulation. 2010; 121: Q657-Q469. 3. Moderate coronary artery calcific atherosclerosis. Electronically Signed: By: Mitzi Hansen M.D. On: 12/15/2017 20:52    Labs:  Basic Metabolic Panel: BMP Latest Ref Rng & Units 01/11/2018 01/04/2018 12/28/2017  Glucose 65 - 99 mg/dL 629(B) 284(X) 324(M)  BUN 6 - 20 mg/dL 14 17 01(U)  Creatinine 0.61 - 1.24 mg/dL 2.72 5.36 6.44  Sodium 135 - 145 mmol/L 139 141 138  Potassium 3.5 - 5.1 mmol/L 3.9 4.2 3.9  Chloride 101 - 111 mmol/L 105 108 104  CO2 22 - 32 mmol/L Calcium 8.9 - 10.3 mg/dL 0.3(K) 7.4(Q) 5.9(D)    CBC: CBC Latest Ref Rng & Units 01/11/2018 12/21/2017 12/18/2017  WBC 4.0 - 10.5 K/uL 6.9 10.2 10.2  Hemoglobin 13.0 - 17.0 g/dL 11.8(L) 13.9 13.3  Hematocrit 39.0 - 52.0 % 38.2(L) 41.5 41.4  Platelets 150 - 400 K/uL 235 216 192    CBG: Recent Labs  Lab 01/12/18 0702 01/12/18 1147 01/12/18 1655 01/12/18 2124 01/13/18 0648  GLUCAP 113* 112* 88 114* 109*    Brief HPI:   Franklin Hicks is a 67 year old male with history of CAD with chronic combined CHF, T2DM, morbid obesity, PE in the past, who was admitted on 12/13/2017 after falling in the bathroom striking his head on the tub and inability to move.  He was found to have central cord injury with MRI of spine showing moderate canal stenosis C4-C5, severe left C5-C6 and severe right stenosis   C6-C7 with increase in cord signal at C4.  Dr. Larina Bras who recommended steroids to manage possible cervical cord injury as well as extensive rehab and cervical collar for stabilization.  Hospital course significant for pleuritic chest pain due to acute PE as well as incidental finding of stable 4 cm ascending aortic aneurysm.  He was started on IV heparin and neurosurgery to discuss surgical intervention after rehab.  Therapy ongoing and patient was noted to have significant functional deficits.  CIR was recommended for follow-up therapy.   Hospital Course: Franklin Hicks was admitted to rehab 12/17/2017 for inpatient therapies to consist of PT and OT at least three hours five days a week. Past admission physiatrist, therapy team and rehab RN have worked together to provide customized collaborative inpatient rehab.  Blood pressures have been reasonably controlled on metoprolol and Hytrin. Bladder and bowel function has improved and he is continent at discharge. Diabetes has been monitored with ac/hs cbg checks and BS have ranged on 100-120 range on CM diet.   Gabapentin was added and titrated upwards to help manage neuropathic symptoms.  Pain has been controlled with prn tylenol.  His respiratory status has improved and he was transitioned from IV heparin to Xarelto.  Follow up CBC showed drop in H/H but no signs of bleeding noted. HHRN to repeat CBC on 05/28 with results to primary MD.  No signs of fluid overload noted and his weight is down to 356 lbs. He is showing improvement in strength and activity tolerance. He  Has progressed to min assist at wheelchair level and will continue to receive follow up  HHPT, HHOT and HHRN by [facility] after discharge.    Rehab course: During patient's stay in rehab weekly team conferences were held to monitor patient's progress, set goals and discuss barriers to discharge. At admission, patient required total assist + 2 with mobility and self care tasks.  He  has had  improvement in activity tolerance, balance, postural control as well as ability to compensate for deficits.  He is able to perform ADL tasks with min assist. He is able to propel his wheelchair for 100' at modified independent level. He is able to perform SB transfers with supervision and sit to stand transfer with min guard assist. Family education was completed regarding all aspects of care.     Disposition:  Home.   Diet: Heart healthy/diabetic.   Special Instructions: 1. HHRN to repeat CBC on 5/28 with results to Dr.Hunter.  2. Family to limiting standing/walking with therapy only.  3. Annual imaging recommended for monitoring of AAA.  Discharge Instructions    Ambulatory referral to Physical Medicine Rehab   Complete by:  As directed    1-2 weeks transitional care appt    2.  Allergies as of 01/13/2018      Reactions   Ace Inhibitors    Angioedema   Other Hives, Itching   msg      Medication List    STOP taking these medications   heparin 100-0.45 UNIT/ML-% infusion   ondansetron 4 MG tablet Commonly known as:  ZOFRAN     TAKE these medications   acetaminophen 325 MG tablet Commonly known as:  TYLENOL Take 1-2 tablets (325-650 mg total) by mouth every 4 (four) hours as needed for mild pain. What changed:    how  much to take  when to take this  reasons to take this   atorvastatin 80 MG tablet Commonly known as:  LIPITOR Take 1 tablet (80 mg total) by mouth daily at 6 PM.   clopidogrel 75 MG tablet Commonly known as:  PLAVIX Take 1 tablet (75 mg total) by mouth daily.   docusate sodium 100 MG capsule Commonly known as:  COLACE Take 2 capsules (200 mg total) by mouth daily after supper. What changed:    how much to take  when to take this   furosemide 20 MG tablet Commonly known as:  LASIX Take 1 tablet (20 mg total) by mouth every Monday, Wednesday, and Friday.   gabapentin 100 MG capsule Commonly known as:  NEURONTIN Take 1 capsule (100 mg  total) by mouth 3 (three) times daily.   metoprolol tartrate 50 MG tablet Commonly known as:  LOPRESSOR Take 1 tablet (50 mg total) by mouth 2 (two) times daily.   multivitamin with minerals Tabs tablet Take 1 tablet by mouth daily.   potassium citrate 10 MEQ (1080 MG) SR tablet Commonly known as:  UROCIT-K Take 1 tablet (10 mEq total) by mouth 3 (three) times daily with meals. What changed:  when to take this   rivaroxaban 20 MG Tabs tablet Commonly known as:  XARELTO Take 1 tablet (20 mg total) by mouth daily with supper.   terazosin 10 MG capsule Commonly known as:  HYTRIN Take 1 capsule (10 mg total) by mouth at bedtime. TAKE 1 CAPSULE (10 MG TOTAL) BY MOUTH AT BEDTIME. What changed:  additional instructions   vitamin B-12 100 MCG tablet Commonly known as:  CYANOCOBALAMIN Take 1 tablet (100 mcg total) by mouth daily.      Follow-up Information    Ranelle Oyster, MD Follow up.   Specialty:  Physical Medicine and Rehabilitation Why:  office will call you with follow up appointment Contact information: [address] [place] [address] [phone]        Maeola Harman, MD. Call.   Specialty:  Neurosurgery Why:  for follow up appointment Contact information: [address] [place] [address] [phone]        Shelva Majestic, MD Follow up on 01/26/2018.   Specialty:  Family Medicine Why:  @ 11:30 am for hospital follow up appt (please arrive 15 mins prior to appt) Contact information: [address] [place] [address] [phone]           Signed: Jacquelynn Cree 01/13/2018, 6:42 PM

## 2018-01-13 NOTE — Progress Notes (Signed)
[place] PHYSICAL MEDICINE & REHABILITATION     PROGRESS NOTE    Subjective/Complaints: Anxious but excited to be going home. No new problems  ROS: Patient denies fever, rash, sore throat, blurred vision, nausea, vomiting, diarrhea, cough, shortness of breath or chest pain, joint or back pain, headache, or mood change.   Objective: Vital Signs: Blood pressure (!) 123/48, pulse 70, temperature 97.9 F (36.6 C), temperature source Oral, resp. rate 18, height 6' (1.829 m), weight (!) 161.5 kg (356 lb), SpO2 96 %. No results found. Recent Labs    01/11/18 0551  WBC 6.9  HGB 11.8*  HCT 38.2*  PLT 235   Recent Labs    01/11/18 0551  NA 139  K 3.9  CL 105  GLUCOSE 125*  BUN 14  CREATININE 1.01  CALCIUM 8.4*   CBG (last 3)  Recent Labs    01/12/18 1655 01/12/18 2124 01/13/18 0648  GLUCAP 88 114* 109*    Wt Readings from Last 3 Encounters:  01/13/18 (!) 161.5 kg (356 lb)  12/15/17 (!) 173.1 kg (381 lb 9.9 oz)  09/24/17 (!) 160 kg (352 lb 12.8 oz)    Physical Exam:  Constitutional: No distress . Vital signs reviewed. obese HEENT: EOMI, oral membranes moist Neck: supple Cardiovascular: RRR without murmur. No JVD    Respiratory: CTA Bilaterally without wheezes or rales. Normal effort    GI: BS +, non-tender, non-distended  Musculoskeletal: left knee flexesto about 50 degrees in sitting.  Neurological: He is alert and oriented  Motor: LUE  4 prox to 4/5 HI RUE 4/5 proximal to distal. 4/5HI  LLE:  3/5 HF, KE, lmited by knee.   4/5 ADF/PF RLE: 3/5 hip flexion, 3+/5 knee extension to 4/5 distally Skin: Skin is warm and dry.    Psychiatric: pleasant and cooperative.    Assessment/Plan:  1. Functional deficits and tetraplegia secondary to incomplete C4 spinal cord injury which require 3+ hours per day of interdisciplinary therapy in a comprehensive inpatient rehab setting. Physiatrist is providing close team supervision and 24 hour management of active medical  problems listed below. Physiatrist and rehab team continue to assess barriers to discharge/monitor patient progress toward functional and medical goals.  Function:  Bathing Bathing position Bathing activity did not occur: N/A(night bath) Position: Shower  Bathing parts Body parts bathed by patient: Right arm, Left arm, Chest, Abdomen, Front perineal area, Right upper leg, Left upper leg, Right lower leg, Left lower leg Body parts bathed by helper: Buttocks  Bathing assist Assist Level: Touching or steadying assistance(Pt > 75%) Assistive Device Comment: LH sponge    Upper Body Dressing/Undressing Upper body dressing Upper body dressing/undressing activity did not occur: (preferred to work on UE strength & use today rather than bathe and dress again.   Stated he did so modifiied bathing on saturday or sunday) What is the patient wearing?: Pull over shirt/dress     Pull over shirt/dress - Perfomed by patient: Thread/unthread right sleeve, Thread/unthread left sleeve, Pull shirt over trunk Pull over shirt/dress - Perfomed by helper: Put head through opening        Upper body assist Assist Level: Touching or steadying assistance(Pt > 75%)      Lower Body Dressing/Undressing Lower body dressing Lower body dressing/undressing activity did not occur: N/A What is the patient wearing?: Pants, Shoes     Pants- Performed by patient: Thread/unthread right pants leg, Thread/unthread left pants leg, Pull pants up/down Pants- Performed by helper: Pull pants up/down Non-skid slipper socks-  Performed by patient: Don/doff right sock, Don/doff left sock Non-skid slipper socks- Performed by helper: Don/doff right sock, Don/doff left sock     Shoes - Performed by patient: Don/doff right shoe, Don/doff left shoe Shoes - Performed by helper: Don/doff left shoe, Don/doff right shoe          Lower body assist Assist for lower body dressing: Touching or steadying assistance (Pt > 75%) Assistive  Device Comment: Reacher Set up : To obtain clothing/put away  Toileting Toileting Toileting activity did not occur: No continent bowel/bladder event Toileting steps completed by patient: Adjust clothing prior to toileting, Adjust clothing after toileting Toileting steps completed by helper: Performs perineal hygiene    Toileting assist Assist level: Touching or steadying assistance (Pt.75%)   Transfers Chair/bed transfer Chair/bed transfer activity did not occur: Safety/medical concerns Chair/bed transfer method: Lateral scoot, Stand pivot Chair/bed transfer assist level: Touching or steadying assistance (Pt > 75%) Chair/bed transfer assistive device: Armrests, Walker, Sliding board Mechanical lift: Stedy   Locomotion Ambulation Ambulation activity did not occur: Safety/medical concerns   Max distance: 2' Assist level: 2 helpers   Education administrator activity did not occur: Safety/medical concerns Type: Manual Max wheelchair distance: 100 Assist Level: No help, No cues, assistive device, takes more than reasonable amount of time  Cognition Comprehension Comprehension assist level: Follows complex conversation/direction with no assist  Expression Expression assist level: Expresses complex ideas: With no assist  Social Interaction Social Interaction assist level: Interacts appropriately with others - No medications needed.  Problem Solving Problem solving assist level: Solves complex problems: Recognizes & self-corrects  Memory Memory assist level: Complete Independence: No helper  Medical Problem List and Plan:  1. Functional deficits secondary to C4 Spinal cord injury, motor and sensory incomplete   Discussed dc goals, needs.    -family ed, dc home today  -Patient to see Rehab MD/provider in the office for transitional care encounter in 1-2 weeks.  2. DVT/PE/Anticoagulation: xarelto long term 3. Pain Management: managed with prn medications.    -  gabapentin 100 mg 3 times  daily for neuropathic pain   4. Mood: LCSW to follow for evaluation and support.  5. Neuropsych: This patient is capable of making decisions on his own behalf.  6. Skin/Wound Care: continue pressure relief measures, appropriate nutrition  Prevalon boots---can wear PRN at home 7. Fluids/Electrolytes/Nutrition: encourage PO   8. T2DM: Monitor BS ac/hs and use SSI for elevated BS.    Controlled on 5/22 9. CAD: Managed with Lipitor, Plavix, and metoprolol  10. Morbid obesity: Body mass index is 51.76 kg/m.   Diet and exercise education ongoing   -po intake good. 11. Combined systolic/diastolic CHF: Monitor weights daily. Continue Lasix, Plavix, statin and metoprolol.   Filed Weights   01/11/18 0528 01/12/18 0412 01/13/18 0539  Weight: (!) 162.8 kg (359 lb) (!) 162.4 kg (358 lb) (!) 161.5 kg (356 lb)   Weight remains stable 5/22   12. HTN: Monitor BP bid. Continue terazosin and furosemide.   Intermittent elevation.   -reasonable control 5/22 13. H/o BPH: Reports voiding without difficulty--on Hytrin.  14. Bowel and bladder: appears to be continent and emptying without issue   -  OOB to void as much as possible     LOS (Days) 27 A FACE TO FACE EVALUATION WAS PERFORMED  Ranelle Oyster, MD 01/13/2018 9:26 AM

## 2018-01-13 NOTE — Progress Notes (Signed)
Social Work  Discharge Note  The overall goal for the admission was met for:   Discharge location: Yes - home with wife and family providing 24/7 assistance  Length of Stay: Yes - 27 days  Discharge activity level: Yes - min assist overall w/c level  Home/community participation: Yes  Services provided included: MD, RD, PT, OT, RN, TR, Pharmacy and [place]: [facility] Medicare  Follow-up services arranged: Home Health: RN, PT, OT via [place], DME: 22x20 wheelchair, cushion, wide/bari rolling walker, wide drop arm commode and 30" transfer board all via [place] and Patient/Family has no preference for HH/DME agencies  Comments (or additional information): Have submitted SCAT application for pt  Patient/Family verbalized understanding of follow-up arrangements: Yes  Individual responsible for coordination of the follow-up plan: pt  Confirmed correct DME delivered: Trenesha Alcaide 01/13/2018  And pt aware that current w/c from [facility] is a loaner w/c and that his permanent w/c has been special ordered with anticipated 2 weeks for arrival.  Royale Lennartz

## 2018-01-15 ENCOUNTER — Telehealth: Payer: Self-pay | Admitting: *Deleted

## 2018-01-15 NOTE — Telephone Encounter (Signed)
Transitional Care call-I spoke with Mr Sciarra and his wife.    1. Are you/is patient experiencing any problems since coming home? Are there any questions regarding any aspect of care?NO 2. Are there any questions regarding medications administration/dosing? Are meds being taken as prescribed? Patient should review meds with caller to confirm. Confirmed he has all medications. 3. Have there been any falls? NO 4. Has Home Health been to the house and/or have they contacted you? If not, have you tried to contact them? Can we help you contact them? Initial contact has been made, no appts yet for therapy. 5. Are bowels and bladder emptying properly? Are there any unexpected incontinence issues? If applicable, is patient following bowel/bladder programs? NO problems. 6. Any fevers, problems with breathing, unexpected pain? NO 7. Are there any skin problems or new areas of breakdown? NO 8. Has the patient/family member arranged specialty MD follow up (ie cardiology/neurology/renal/surgical/etc)?  Can we help arrange? NO.  Appt given today to see Dr Riley Kill. 9. Does the patient need any other services or support that we can help arrange? NO 10. Are caregivers following through as expected in assisting the patient? YES 11. Has the patient quit smoking, drinking alcohol, or using drugs as recommended? N/A  Appointment June 11 @ 10:00, arrive by 9:40 to see Dr Riley Kill. (could not make the original TC appt). Address reviewed and mailing paperwork. [id number]

## 2018-01-20 ENCOUNTER — Encounter: Payer: Medicare Other | Admitting: Physical Medicine & Rehabilitation

## 2018-01-22 ENCOUNTER — Telehealth: Payer: Self-pay

## 2018-01-22 ENCOUNTER — Ambulatory Visit: Payer: Medicare Other | Admitting: Family Medicine

## 2018-01-22 ENCOUNTER — Telehealth: Payer: Self-pay | Admitting: Cardiology

## 2018-01-22 NOTE — Telephone Encounter (Signed)
Amber PT from [facility] called requesting verbal order for 2wk3. Verbal order given

## 2018-01-22 NOTE — Telephone Encounter (Signed)
New message    Pt c/o medication issue:  1. Name of Medication: rivaroxaban (XARELTO) 20 MG TABS tablet and clopidogrel (PLAVIX) 75 MG tablet  2. How are you currently taking this medication (dosage and times per day)?   3. Are you having a reaction (difficulty breathing--STAT)? No  4. What is your medication issue? Patient wants to know if he should continue this medication. Declined making an appointment at this time due to - lack of mobility from spinal injury.

## 2018-01-22 NOTE — Telephone Encounter (Signed)
To be address by provider

## 2018-01-22 NOTE — Telephone Encounter (Signed)
Talked to Mr Franklin Hicks.  He is requesting additional information about taking plavix and xarelto at the same time. He was told both mediations are "blood thinners"   Explained to patient plavix  is an antiplatelet and Xarelto an anticoagulant to treat his recent clot. Patient expressed understanding and agree on taking both medication for now.  He will like a final decision from DR Herbie BaltimoreHarding about his plavix (clopidogrel) duration. Will it be lifelong or okay to stop in near future.

## 2018-01-22 NOTE — Telephone Encounter (Signed)
Returned call to pt he states that he is concerned that he should continue both Plavix and Xarelto. Informed pt re medications pt states that he wants to know what the DOCTOR want him to do. He states that at the hospital they would not give him a direct answer and they finally said to call your cardiologist and ask them-per pt.

## 2018-01-24 NOTE — Telephone Encounter (Signed)
He should be seen me in the next few weeks.  For now I would continue with Plavix and Xarelto, but we can discuss options going forward.  I would be fine having him stop Plavix until he is off Xarelto, but would like to discuss with his means with him.   Bryan Lemmaavid Casy Brunetto, MD

## 2018-01-25 NOTE — Telephone Encounter (Signed)
Patient received updated information. He will continue Xarelto and Plavix as recommended and call back to schedule f/u appointment.

## 2018-01-26 ENCOUNTER — Inpatient Hospital Stay: Payer: Medicare Other | Admitting: Family Medicine

## 2018-01-26 ENCOUNTER — Telehealth: Payer: Self-pay | Admitting: *Deleted

## 2018-01-26 NOTE — Telephone Encounter (Signed)
Beaulah CorinSusan Davis, OT, [facility] left a message asking for  verbal orders 1week1 followed by 2week2.  Chart reviewed. Verbal orders given per office protocol

## 2018-01-29 ENCOUNTER — Telehealth: Payer: Self-pay | Admitting: Family Medicine

## 2018-01-29 NOTE — Telephone Encounter (Signed)
Copied from CRM [phone]#[id number]. Topic: Quick Communication - See Telephone Encounter >> Jan 29, 2018  9:04 AM Arlyss Gandyichardson, Nykolas Bacallao N, NT wrote: CRM for notification. See Telephone encounter for: 01/29/18. Angel with [facility] needing verbal orders for nursing for 2 times a week for 3 weeks. And needing wound care orders. CB#: [phone]. May leave voicemail.

## 2018-01-29 NOTE — Telephone Encounter (Signed)
See note

## 2018-02-02 ENCOUNTER — Encounter: Payer: Medicare Other | Admitting: Physical Medicine & Rehabilitation

## 2018-02-05 ENCOUNTER — Telehealth: Payer: Self-pay | Admitting: Family Medicine

## 2018-02-05 NOTE — Telephone Encounter (Signed)
See note

## 2018-02-05 NOTE — Telephone Encounter (Signed)
Franklin Hicks with [facility] called and reported the patient's BP in both arms 162/100. Patient says he took his medications last night and this morning before BP taken. Katherina RightDenny says it is protocol to notify the provider of abnormal readings. He says the patient is not experiencing symptoms. I advised this will be sent to Dr. Durene CalHunter for his recommendation and someone will call the patient back, he verbalized understanding.

## 2018-02-05 NOTE — Telephone Encounter (Signed)
Blood pressure looked great last visit here with me- lets have patient come in for follow up BP check next week in office. Is he already scheduled on 18th for this or other reason?

## 2018-02-09 ENCOUNTER — Telehealth: Payer: Self-pay | Admitting: *Deleted

## 2018-02-09 ENCOUNTER — Inpatient Hospital Stay: Payer: Medicare Other | Admitting: Family Medicine

## 2018-02-09 NOTE — Telephone Encounter (Signed)
Lawanna KobusAngel RN called for verbal order [facility] to assist with baths 2wk1 and 1wk2.  Approval given.

## 2018-02-09 NOTE — Telephone Encounter (Signed)
See note.   Copied from CRM [phone]. Topic: Quick Communication - See Telephone Encounter >> Feb 09, 2018  1:42 PM Terisa Starraylor, Brittany L wrote: Jon Gillslexis said to please call her at [phone]. She said to call her instead of Angel.

## 2018-02-09 NOTE — Telephone Encounter (Signed)
Called and spoke to patient who states he is homebound right now as his wheelchair ramp has not been built yet. He states it is supposed to be completed in the next couple of weeks and he will call and schedule a BP check then. He states his BP has come back down to 130/70s.

## 2018-02-10 ENCOUNTER — Other Ambulatory Visit: Payer: Self-pay | Admitting: Cardiology

## 2018-02-11 NOTE — Telephone Encounter (Signed)
Rx(s) sent to pharmacy electronically.  

## 2018-02-12 ENCOUNTER — Telehealth: Payer: Self-pay | Admitting: *Deleted

## 2018-02-12 NOTE — Telephone Encounter (Signed)
Beaulah CorinSusan Davis, OT, [facility], left a message asking for [facility].  Chart reviewed. Verbal orders given per office protocol.

## 2018-02-12 NOTE — Telephone Encounter (Signed)
Called and provided verbal order as requested 

## 2018-02-13 ENCOUNTER — Other Ambulatory Visit: Payer: Self-pay | Admitting: Physical Medicine & Rehabilitation

## 2018-02-13 NOTE — Telephone Encounter (Signed)
Looks like this was filled in Rehab after hosp adm... Please advise if okay to refill

## 2018-02-15 ENCOUNTER — Telehealth: Payer: Self-pay | Admitting: *Deleted

## 2018-02-15 NOTE — Telephone Encounter (Signed)
Amber, PT, [facility] left a message asking for verbal orders to extend PT 2week4.  Chart reviewed. Verbal orders given per office protocol.

## 2018-02-16 ENCOUNTER — Encounter: Payer: Medicare Other | Admitting: Physical Medicine & Rehabilitation

## 2018-03-04 ENCOUNTER — Other Ambulatory Visit: Payer: Self-pay | Admitting: Family Medicine

## 2018-03-07 ENCOUNTER — Other Ambulatory Visit: Payer: Self-pay | Admitting: Family Medicine

## 2018-03-09 ENCOUNTER — Inpatient Hospital Stay: Payer: Medicare Other | Admitting: Family Medicine

## 2018-03-09 ENCOUNTER — Encounter

## 2018-03-10 ENCOUNTER — Encounter: Payer: Medicare Other | Attending: Physical Medicine & Rehabilitation | Admitting: Physical Medicine & Rehabilitation

## 2018-03-10 ENCOUNTER — Other Ambulatory Visit: Payer: Self-pay

## 2018-03-10 ENCOUNTER — Encounter: Payer: Self-pay | Admitting: Physical Medicine & Rehabilitation

## 2018-03-10 VITALS — BP 160/91 | HR 96 | Ht 72.0 in | Wt 360.0 lb

## 2018-03-10 DIAGNOSIS — M542 Cervicalgia: Secondary | ICD-10-CM | POA: Insufficient documentation

## 2018-03-10 DIAGNOSIS — E7800 Pure hypercholesterolemia, unspecified: Secondary | ICD-10-CM | POA: Diagnosis not present

## 2018-03-10 DIAGNOSIS — I5042 Chronic combined systolic (congestive) and diastolic (congestive) heart failure: Secondary | ICD-10-CM | POA: Insufficient documentation

## 2018-03-10 DIAGNOSIS — X58XXXA Exposure to other specified factors, initial encounter: Secondary | ICD-10-CM | POA: Diagnosis not present

## 2018-03-10 DIAGNOSIS — I252 Old myocardial infarction: Secondary | ICD-10-CM | POA: Diagnosis not present

## 2018-03-10 DIAGNOSIS — M1710 Unilateral primary osteoarthritis, unspecified knee: Secondary | ICD-10-CM | POA: Diagnosis not present

## 2018-03-10 DIAGNOSIS — I2510 Atherosclerotic heart disease of native coronary artery without angina pectoris: Secondary | ICD-10-CM | POA: Diagnosis not present

## 2018-03-10 DIAGNOSIS — N400 Enlarged prostate without lower urinary tract symptoms: Secondary | ICD-10-CM | POA: Diagnosis not present

## 2018-03-10 DIAGNOSIS — G8250 Quadriplegia, unspecified: Secondary | ICD-10-CM

## 2018-03-10 DIAGNOSIS — I110 Hypertensive heart disease with heart failure: Secondary | ICD-10-CM | POA: Insufficient documentation

## 2018-03-10 DIAGNOSIS — S14104A Unspecified injury at C4 level of cervical spinal cord, initial encounter: Secondary | ICD-10-CM | POA: Insufficient documentation

## 2018-03-10 DIAGNOSIS — N319 Neuromuscular dysfunction of bladder, unspecified: Secondary | ICD-10-CM | POA: Diagnosis not present

## 2018-03-10 DIAGNOSIS — Z87891 Personal history of nicotine dependence: Secondary | ICD-10-CM | POA: Insufficient documentation

## 2018-03-10 DIAGNOSIS — S14104S Unspecified injury at C4 level of cervical spinal cord, sequela: Secondary | ICD-10-CM

## 2018-03-10 DIAGNOSIS — Z7902 Long term (current) use of antithrombotics/antiplatelets: Secondary | ICD-10-CM | POA: Diagnosis not present

## 2018-03-10 DIAGNOSIS — Z6841 Body Mass Index (BMI) 40.0 and over, adult: Secondary | ICD-10-CM | POA: Insufficient documentation

## 2018-03-10 DIAGNOSIS — Z5181 Encounter for therapeutic drug level monitoring: Secondary | ICD-10-CM | POA: Insufficient documentation

## 2018-03-10 DIAGNOSIS — E1140 Type 2 diabetes mellitus with diabetic neuropathy, unspecified: Secondary | ICD-10-CM | POA: Insufficient documentation

## 2018-03-10 DIAGNOSIS — Z7901 Long term (current) use of anticoagulants: Secondary | ICD-10-CM | POA: Diagnosis not present

## 2018-03-10 DIAGNOSIS — Z86711 Personal history of pulmonary embolism: Secondary | ICD-10-CM | POA: Diagnosis not present

## 2018-03-10 DIAGNOSIS — M170 Bilateral primary osteoarthritis of knee: Secondary | ICD-10-CM | POA: Diagnosis not present

## 2018-03-10 DIAGNOSIS — M109 Gout, unspecified: Secondary | ICD-10-CM | POA: Insufficient documentation

## 2018-03-10 MED ORDER — DICLOFENAC SODIUM 1 % TD GEL
1.0000 | Freq: Three times a day (TID) | TRANSDERMAL | 4 refills | Status: DC
Start: 2018-03-10 — End: 2024-06-29

## 2018-03-10 MED ORDER — GABAPENTIN 100 MG PO CAPS: 100.0000 mg | ORAL_CAPSULE | Freq: Four times a day (QID) | ORAL | 4 refills | Status: DC

## 2018-03-10 NOTE — Progress Notes (Signed)
Subjective:    Patient ID: Franklin Hicks, male    DOB: 08/02/1951, 67 y.o.   MRN: [id number]  HPI   Franklin Hicks is here in follow up of his C4 spinal cord injury. He has been involved with HH PT and OT. They have begun working on standing and transferring. Car transfers have been addressed. He just had his ramp finished about a week or two ago.  Today is the first day he is really been out of the house.  The car transfer was somewhat difficult.  His bowels and bladder are working well.  He denies swelling, coughing, shortness of breath or chest pain.  He has some pain with prolonged activity, particularly in his knees, now the right more than left. He sometimes has some pain running down his right leg when he lies down at night. His mattress is uncomfortable.   He may have an area of skin breakdown along the left gluteal region although the area appears to have been small.   He has not followed up with neurosurgery yet     Pain Inventory Average Pain 3 Pain Right Now 3 My pain is dull  In the last 24 hours, has pain interfered with the following? General activity 0 Relation with others 0 Enjoyment of life 0 What TIME of day is your pain at its worst? night Sleep (in general) Fair  Pain is worse with: inactivity and standing Pain improves with: rest and therapy/exercise Relief from Meds: no meds  Mobility ability to climb steps?  no do you drive?  no use a wheelchair  Function retired I need assistance with the following:  bathing  Neuro/Psych tingling spasms  Prior Studies Any changes since last visit?  no  Physicians involved in your care Any changes since last visit?  no   Family History  Problem Relation Age of Onset  . Liver disease Mother    Social History   Socioeconomic History  . Marital status: Married    Spouse name: Not on file  . Number of children: Not on file  . Years of education: Not on file  . Highest education level: Not on file    Occupational History  . Occupation: Runner, broadcasting/film/videoteacher  Social Needs  . Financial resource strain: Not on file  . Food insecurity:    Worry: Not on file    Inability: Not on file  . Transportation needs:    Medical: Not on file    Non-medical: Not on file  Tobacco Use  . Smoking status: Former Smoker    Packs/day: 1.00    Years: 13.00    Pack years: 13.00    Types: Cigarettes    Last attempt to quit: 07/13/1977    Years since quitting: 40.6  . Smokeless tobacco: Never Used  Substance and Sexual Activity  . Alcohol use: Yes    Alcohol/week: 0.0 oz    Comment: holidays only  . Drug use: No  . Sexual activity: Not on file  Lifestyle  . Physical activity:    Days per week: Not on file    Minutes per session: Not on file  . Stress: Not on file  Relationships  . Social connections:    Talks on phone: Not on file    Gets together: Not on file    Attends religious service: Not on file    Active member of club or organization: Not on file    Attends meetings of clubs or organizations: Not on file  Relationship status: Not on file  Other Topics Concern  . Not on file  Social History Narrative   Married for 44 years. He has 3 children and 5 grandchildren. They also 5 great grandchildren. He lives with his wife. 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 -> [employer] education   Occasional beer or wine   No smoking history or illicit drug use.   One hour water aerobics sessions 3 days a week. Has not started back yet read from recent illness sounds.   Past Surgical History:  Procedure Laterality Date  . CARDIAC CATHETERIZATION N/A 09/19/2016   Procedure: Left Heart Cath and Coronary Angiography;  Surgeon: Orpah Cobb, MD;  Location: [facility] INVASIVE CV LAB;  Service: Cardiovascular: 95% proximal Ramus Intermedius --> PCI  . CARDIAC CATHETERIZATION N/A 09/19/2016   Procedure: Coronary Stent Intervention;  Surgeon: Yvonne Kendall, MD;  Location: [facility] INVASIVE CV LAB;   Service: Cardiovascular: 95% ramus intermedius;  Resolute Onyx 2.75 x 18 mm drug-eluting stent  . CYSTOSCOPY/RETROGRADE/URETEROSCOPY/STONE EXTRACTION WITH BASKET  2956,2130   ureteral stone   . MENISCUS REPAIR Left    knee open meniscetomy  . TRANSTHORACIC ECHOCARDIOGRAM  2004   no lvh nl ejection fraction  . TRANSTHORACIC ECHOCARDIOGRAM  09/19/2016   In setting of NSTEMI:  EF 45-50% with diffuse hypokinesis. GR 2 DD. Mild biatrial enlargement.   Past Medical History:  Diagnosis Date  . Arthritis    "knees" (12/17/2017)  . CAD S/P percutaneous coronary angioplasty 09/20/2016   Nstemi 08/2016. Dr. Algie Coffer. DES- brilinta and asa. Requests change to [facility] cardiology; 95% Ramus -> PCI Resolute Onyx DES 2.75 x 18  . Central cord syndrome (HCC) 12/17/2017  . Chronic combined systolic and diastolic heart failure (HCC) 10/09/2016   EF 45% and grade II diastolic after nstemi  . Diet-controlled diabetes mellitus (HCC)   . DJD (degenerative joint disease)   . Gout   . High cholesterol   . Hypertension   . NSTEMI (non-ST elevated myocardial infarction) (HCC) 09/20/2016   95% Ramus - > PCI   . Obesity   . Pulmonary embolism (HCC) ~ 2015   BP (!) 160/91   Pulse 96   Ht 6' (1.829 m) Comment: pt reported  Wt (!) 360 lb (163.3 kg) Comment: pt reported  SpO2 95%   BMI 48.82 kg/m   Opioid Risk Score:   Fall Risk Score:  `1  Depression screen PHQ 2/9  Depression screen [facility] 2/9 03/10/2018 12/16/2016 10/09/2016 12/13/2014  Decreased Interest 0 0 0 0  Down, Depressed, Hopeless 0 0 0 0  PHQ - 2 Score 0 0 0 0    Review of Systems  Constitutional: Negative.   HENT: Negative.   Eyes: Negative.   Respiratory: Negative.   Cardiovascular: Negative.   Gastrointestinal: Negative.   Endocrine: Negative.   Genitourinary: Negative.   Musculoskeletal: Positive for neck pain.  Skin: Negative.   Allergic/Immunologic: Negative.   Neurological: Negative.   Hematological: Negative.    Psychiatric/Behavioral: Negative.   All other systems reviewed and are negative.      Objective:   Physical Exam General: No acute distress.  Remains morbidly obese HEENT: EOMI, oral membranes moist.  Wearing cervical collar Cards: reg rate  Chest: normal effort Abdomen: Soft, NT, ND Skin: dry, intact.  Did not visualize sacral and gluteal areas Extremities: no edema Musculoskeletal: Right hamstrings are tight.  He was only able to extend the knee to -30 neutral.  He actually had better left  knee extension of -5 to 10 degrees.  Right knee slightly tender to palpation and mild effusion noted Neurological: He is alert and oriented  Motor: LUE   4+ to 5 out of 5 proximal distal RUE 3- to 3 out of 5 shoulder abduction, 4 out of 5 biceps triceps wrist and hand intrinsics LLE:  3+/5 HF, 3 out of 5 KE, lmited by knee.   4/5 ADF/PF RLE: 3+/5 hip flexion, 3 out of 5/5 knee extension to 4/5 distally Since his light touch and pain in all 4 limbs    Psychiatric: Pleasant and appropriate          Assessment & Plan:  1. Functional deficits secondary to C4 Spinal cord injury, motor and sensory incomplete               -Continue with we discussed prognosis at length today hh therapies----eventual progression to outpt/aquatic therapies  -And he has several more months of therapy of head of him given his neurological status, size, and pre-existing knee osteoarthritis  -Provided patient with phone number for his neurosurgeon.  He will call for follow-up.  He should be able to come out of the cervical collar at this point as long as his x-rays are adequate 2. DVT/PE/Anticoagulation: xarelto long term 3. Pain Management: .              -  increase gabapentin 100 mg bid and 200mg  qhs   for neuropathic pain  -Add Voltaren gel for knee pain.  Consider the injections  -Discussed use of ice for knee pain and heat to help stretch hamstrings 4.  Skin/Wound Care: Per home health RN.                  5. T2DM: Per primary 6 CAD:  Lipitor, Plavix, and metoprolol  7. Morbid obesity: Body mass index is 51.76 kg/m.              -To reach wanted functional goals weight loss will be important in the long run.   8. Combined systolic/diastolic CHF:   Continue Lasix, Plavix, statin and metoprolol.                 9. HTN: Borderline to controlled at present.  Follow-up with PCP 10. H/o BPH/neurogenic bladder: Reports voiding without difficulty--on Hytrin.    Thirty minutes of face to face patient care time were spent during this visit. All questions were encouraged and answered.  Follow-up in 1 month's time

## 2018-03-10 NOTE — Patient Instructions (Addendum)
Franklin Hicks, Joseph, MD. Call.   Specialty:  Neurosurgery Why:  for follow up appointment Contact information: [address] [place] [address] [phone]   HEAT TO HAMSTRINGS ICE TO FRONT OF KNEE, ESPECIALLY FOR PAIN RELIEF AFTER THERAPY       HEAT BEFORE, ICE AFTER THERAPIES, GENERALLY SPEAKING.

## 2018-03-11 ENCOUNTER — Other Ambulatory Visit: Payer: Self-pay | Admitting: Family Medicine

## 2018-03-14 ENCOUNTER — Other Ambulatory Visit: Payer: Self-pay | Admitting: Family Medicine

## 2018-03-16 ENCOUNTER — Inpatient Hospital Stay: Payer: Medicare Other | Admitting: Family Medicine

## 2018-03-19 ENCOUNTER — Telehealth: Payer: Self-pay | Admitting: *Deleted

## 2018-03-19 NOTE — Telephone Encounter (Signed)
Amber, PT, [facility]  Left a message asking for verbal orders to recert patient 2week4 to address limited mobility and verbal orders for continuation of HHAide 2week4 to address showering and hygiene. The goal is to get the patient to stand, take a few steps and be able to get in a vehicle. PT hopes patient will then be able to transition to outpatient.  Medical record reviewed. Verbal orders given via secured voicemail

## 2018-03-24 ENCOUNTER — Telehealth: Payer: Self-pay | Admitting: *Deleted

## 2018-03-24 NOTE — Telephone Encounter (Signed)
Beaulah CorinSusan Davis, [facility], [facility] left a message asking fore verbal orders to recert HHOT 2week4 followed by 1week4. Medical record was reviewed.  Verbal orders given per office protocol

## 2018-03-26 ENCOUNTER — Other Ambulatory Visit: Payer: Self-pay | Admitting: Family Medicine

## 2018-03-26 ENCOUNTER — Telehealth: Payer: Self-pay

## 2018-03-26 NOTE — Telephone Encounter (Signed)
Amber, PT/[facility] called requesting a verbal order to get nursing back for pt. Pt has developed a fungus or yeast around hip and abdominal area in his folds. Also a Child psychotherapistsocial worker to talk about community resources. Is this ok?

## 2018-03-26 NOTE — Telephone Encounter (Signed)
Hospital doctorAmber notified for the [employer] and SW

## 2018-03-26 NOTE — Telephone Encounter (Signed)
Yes, that's fine. thx

## 2018-03-29 DIAGNOSIS — G8250 Quadriplegia, unspecified: Secondary | ICD-10-CM | POA: Diagnosis not present

## 2018-03-29 DIAGNOSIS — Z86711 Personal history of pulmonary embolism: Secondary | ICD-10-CM

## 2018-03-29 DIAGNOSIS — Z7901 Long term (current) use of anticoagulants: Secondary | ICD-10-CM

## 2018-03-29 DIAGNOSIS — Z955 Presence of coronary angioplasty implant and graft: Secondary | ICD-10-CM

## 2018-03-29 DIAGNOSIS — E119 Type 2 diabetes mellitus without complications: Secondary | ICD-10-CM | POA: Diagnosis not present

## 2018-03-29 DIAGNOSIS — Z7902 Long term (current) use of antithrombotics/antiplatelets: Secondary | ICD-10-CM

## 2018-03-29 DIAGNOSIS — I5042 Chronic combined systolic (congestive) and diastolic (congestive) heart failure: Secondary | ICD-10-CM

## 2018-03-29 DIAGNOSIS — I2510 Atherosclerotic heart disease of native coronary artery without angina pectoris: Secondary | ICD-10-CM

## 2018-03-29 DIAGNOSIS — Z6841 Body Mass Index (BMI) 40.0 and over, adult: Secondary | ICD-10-CM

## 2018-03-29 DIAGNOSIS — I110 Hypertensive heart disease with heart failure: Secondary | ICD-10-CM

## 2018-03-29 DIAGNOSIS — S14154D Other incomplete lesion at C4 level of cervical spinal cord, subsequent encounter: Secondary | ICD-10-CM | POA: Diagnosis not present

## 2018-03-29 DIAGNOSIS — Z9181 History of falling: Secondary | ICD-10-CM | POA: Diagnosis not present

## 2018-03-30 ENCOUNTER — Ambulatory Visit: Payer: Medicare Other | Admitting: Family Medicine

## 2018-03-30 ENCOUNTER — Encounter: Payer: Self-pay | Admitting: Family Medicine

## 2018-03-30 VITALS — BP 132/62 | HR 71 | Temp 97.8°F | Ht 72.0 in | Wt 360.0 lb

## 2018-03-30 DIAGNOSIS — E785 Hyperlipidemia, unspecified: Secondary | ICD-10-CM | POA: Diagnosis not present

## 2018-03-30 DIAGNOSIS — I2699 Other pulmonary embolism without acute cor pulmonale: Secondary | ICD-10-CM

## 2018-03-30 DIAGNOSIS — G9520 Unspecified cord compression: Secondary | ICD-10-CM | POA: Diagnosis not present

## 2018-03-30 DIAGNOSIS — I10 Essential (primary) hypertension: Secondary | ICD-10-CM

## 2018-03-30 DIAGNOSIS — E119 Type 2 diabetes mellitus without complications: Secondary | ICD-10-CM | POA: Diagnosis not present

## 2018-03-30 DIAGNOSIS — I712 Thoracic aortic aneurysm, without rupture, unspecified: Secondary | ICD-10-CM

## 2018-03-30 DIAGNOSIS — I5042 Chronic combined systolic (congestive) and diastolic (congestive) heart failure: Secondary | ICD-10-CM | POA: Diagnosis not present

## 2018-03-30 LAB — COMPREHENSIVE METABOLIC PANEL
ALBUMIN: 3.6 g/dL (ref 3.5–5.2)
ALT: 16 U/L (ref 0–53)
AST: 14 U/L (ref 0–37)
Alkaline Phosphatase: 96 U/L (ref 39–117)
BUN: 26 mg/dL — AB (ref 6–23)
CALCIUM: 9.4 mg/dL (ref 8.4–10.5)
CO2: 30 mEq/L (ref 19–32)
CREATININE: 0.96 mg/dL (ref 0.40–1.50)
Chloride: 103 mEq/L (ref 96–112)
GFR: 99.95 mL/min (ref >60.00)
Glucose, Bld: 134 mg/dL — ABNORMAL HIGH (ref 70–99)
Potassium: 4.2 mEq/L (ref 3.5–5.1)
SODIUM: 140 meq/L (ref 135–145)
Total Bilirubin: 0.6 mg/dL (ref 0.2–1.2)
Total Protein: 7.2 g/dL (ref 6.0–8.3)

## 2018-03-30 LAB — CBC
HCT: 39.1 % (ref 39.0–52.0)
Hemoglobin: 12.9 g/dL — ABNORMAL LOW (ref 13.0–17.0)
MCHC: 33.1 g/dL (ref 30.0–36.0)
MCV: 89.7 fl (ref 78.0–100.0)
PLATELETS: 234 10*3/uL (ref 150.0–400.0)
RBC: 4.36 Mil/uL (ref 4.22–5.81)
RDW: 15 % (ref 11.5–15.5)
WBC: 7.4 10*3/uL (ref 4.0–10.5)

## 2018-03-30 LAB — HEMOGLOBIN A1C: HEMOGLOBIN A1C: 6.5 % (ref 4.6–6.5)

## 2018-03-30 MED ORDER — RIVAROXABAN 20 MG PO TABS: ORAL_TABLET | ORAL | 11 refills | Status: DC

## 2018-03-30 NOTE — Telephone Encounter (Signed)
BP reasonable in office today
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
## 2018-03-30 NOTE — Patient Instructions (Addendum)
Health Maintenance Due  Topic Date Due  . OPHTHALMOLOGY EXAM - Patient will go ahead and schedule 06/28/1961  . COLONOSCOPY - Patient is set up with cologuard 06/28/1999  . INFLUENZA VACCINE - Will await until October 03/25/2018  . I would also like for you to sign up for an annual wellness visit with one of our nurses, Cassie or Darl PikesSusan, who both specialize in the annual wellness visit. This is a free benefit under medicare that may help us find additional ways to help you. Some highlights are reviewing medications, lifestyle, and doing a dementia screen.   . Please check with your pharmacy to see if they have the shingrix vaccine. If they do- please get this immunization and update us by phone call or mychart with dates you receive the vaccine    Please stop by lab before you go  No changes today  We will follow up your aneurysm in April or may or next year.

## 2018-03-31 ENCOUNTER — Other Ambulatory Visit: Payer: Self-pay | Admitting: Family Medicine

## 2018-03-31 NOTE — Assessment & Plan Note (Signed)
aneurysm with yearly follow up- needed 11/2018. avoid levaquin.

## 2018-03-31 NOTE — Assessment & Plan Note (Signed)
Continue MWF lasix- doing well on this regimen

## 2018-03-31 NOTE — Assessment & Plan Note (Signed)
Continue atorvastatin 80mg  with LDL goal 70 or lower- has been slightly high on last check.

## 2018-03-31 NOTE — Assessment & Plan Note (Signed)
Controlled without rx- continue current therapy

## 2018-03-31 NOTE — Assessment & Plan Note (Signed)
Doing well on metoprolol 50mg  BID and terazosin and lasix 20mg  MWF
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
## 2018-03-31 NOTE — Assessment & Plan Note (Signed)
noted PE while hospitalized. History of PE prior 2014. We discussed lifelong anticoagulation with xarelto. He is on 20mg  currently

## 2018-04-01 ENCOUNTER — Other Ambulatory Visit: Payer: Self-pay | Admitting: Physical Medicine & Rehabilitation

## 2018-04-01 DIAGNOSIS — S14104S Unspecified injury at C4 level of cervical spinal cord, sequela: Secondary | ICD-10-CM

## 2018-04-02 ENCOUNTER — Other Ambulatory Visit: Payer: Self-pay | Admitting: Cardiology

## 2018-04-02 NOTE — Telephone Encounter (Signed)
Metoprolol was refilled in June 2019

## 2018-04-09 ENCOUNTER — Other Ambulatory Visit: Payer: Self-pay

## 2018-04-09 MED ORDER — METOPROLOL TARTRATE 50 MG PO TABS: 50.0000 mg | ORAL_TABLET | Freq: Two times a day (BID) | ORAL | 6 refills | Status: DC

## 2018-04-12 ENCOUNTER — Encounter: Payer: Medicare Other | Attending: Physical Medicine & Rehabilitation | Admitting: Physical Medicine & Rehabilitation

## 2018-04-12 ENCOUNTER — Other Ambulatory Visit: Payer: Self-pay

## 2018-04-12 ENCOUNTER — Encounter: Payer: Self-pay | Admitting: Physical Medicine & Rehabilitation

## 2018-04-12 VITALS — BP 93/58 | HR 84 | Ht 72.0 in | Wt 360.0 lb

## 2018-04-12 DIAGNOSIS — Z7902 Long term (current) use of antithrombotics/antiplatelets: Secondary | ICD-10-CM | POA: Diagnosis not present

## 2018-04-12 DIAGNOSIS — Z86711 Personal history of pulmonary embolism: Secondary | ICD-10-CM | POA: Insufficient documentation

## 2018-04-12 DIAGNOSIS — M542 Cervicalgia: Secondary | ICD-10-CM | POA: Insufficient documentation

## 2018-04-12 DIAGNOSIS — I5042 Chronic combined systolic (congestive) and diastolic (congestive) heart failure: Secondary | ICD-10-CM | POA: Diagnosis not present

## 2018-04-12 DIAGNOSIS — E7800 Pure hypercholesterolemia, unspecified: Secondary | ICD-10-CM | POA: Insufficient documentation

## 2018-04-12 DIAGNOSIS — S14104A Unspecified injury at C4 level of cervical spinal cord, initial encounter: Secondary | ICD-10-CM | POA: Insufficient documentation

## 2018-04-12 DIAGNOSIS — Z6841 Body Mass Index (BMI) 40.0 and over, adult: Secondary | ICD-10-CM | POA: Insufficient documentation

## 2018-04-12 DIAGNOSIS — S14104S Unspecified injury at C4 level of cervical spinal cord, sequela: Secondary | ICD-10-CM | POA: Diagnosis not present

## 2018-04-12 DIAGNOSIS — M1710 Unilateral primary osteoarthritis, unspecified knee: Secondary | ICD-10-CM | POA: Insufficient documentation

## 2018-04-12 DIAGNOSIS — M170 Bilateral primary osteoarthritis of knee: Secondary | ICD-10-CM

## 2018-04-12 DIAGNOSIS — N319 Neuromuscular dysfunction of bladder, unspecified: Secondary | ICD-10-CM | POA: Diagnosis not present

## 2018-04-12 DIAGNOSIS — Z7901 Long term (current) use of anticoagulants: Secondary | ICD-10-CM | POA: Insufficient documentation

## 2018-04-12 DIAGNOSIS — Z5181 Encounter for therapeutic drug level monitoring: Secondary | ICD-10-CM | POA: Diagnosis not present

## 2018-04-12 DIAGNOSIS — E1140 Type 2 diabetes mellitus with diabetic neuropathy, unspecified: Secondary | ICD-10-CM | POA: Diagnosis not present

## 2018-04-12 DIAGNOSIS — I252 Old myocardial infarction: Secondary | ICD-10-CM | POA: Diagnosis not present

## 2018-04-12 DIAGNOSIS — Z87891 Personal history of nicotine dependence: Secondary | ICD-10-CM | POA: Insufficient documentation

## 2018-04-12 DIAGNOSIS — M109 Gout, unspecified: Secondary | ICD-10-CM | POA: Insufficient documentation

## 2018-04-12 DIAGNOSIS — N400 Enlarged prostate without lower urinary tract symptoms: Secondary | ICD-10-CM | POA: Diagnosis not present

## 2018-04-12 DIAGNOSIS — I110 Hypertensive heart disease with heart failure: Secondary | ICD-10-CM | POA: Diagnosis not present

## 2018-04-12 DIAGNOSIS — I2510 Atherosclerotic heart disease of native coronary artery without angina pectoris: Secondary | ICD-10-CM | POA: Diagnosis not present

## 2018-04-12 NOTE — Patient Instructions (Signed)
RETURN TO DRIVING PLAN:  WITH THE SUPERVISION OF A LICENSED DRIVER, PLEASE DRIVE IN AN EMPTY PARKING LOT FOR AT LEAST 2-3 TRIALS TO TEST REACTION TIME, VISION, USE OF EQUIPMENT IN CAR, ETC.  IF SUCCESSFUL WITH THE PARKING LOT DRIVING, PROCEED TO SUPERVISED DRIVING TRIALS IN YOUR NEIGHBORHOOD STREETS AT LOW TRAFFIC TIMES TO TEST OBSERVATION TO TRAFFIC SIGNALS, REACTION TIME, ETC. PLEASE ATTEMPT AT LEAST 2-3 TRIALS IN YOUR NEIGHBORHOOD.  IF NEIGHBORHOOD DRIVING IS SUCCESSFUL, YOU MAY PROCEED TO DRIVING IN BUSIER AREAS IN YOUR COMMUNITY WITH SUPERVISION OF A LICENSED DRIVER. PLEASE ATTEMPT AT LEAST 4-5 TRIALS.  IF COMMUNITY DRIVING IS SUCCESSFUL, YOU MAY PROCEED TO DRIVING ALONE, DURING THE DAY TIME, IN NON-PEAK TRAFFIC TIMES. YOU SHOULD DRIVE NO FURTHER THAN 20 MINUTES IN ONE DIRECTION. PLEASE DO NOT DRIVE IF YOU FEEL FATIGUED OR UNDER THE INFLUENCE OF MEDICATION.   PLEASE FEEL FREE TO CALL OUR OFFICE WITH ANY PROBLEMS OR QUESTIONS [phone])  CALL ME WHEN YOU ARE READY FOR OUTPATIENT THERAPY

## 2018-04-12 NOTE — Progress Notes (Signed)
Subjective:    Patient ID: Franklin Hicks, male    DOB: 11/09/1950, 67 y.o.   MRN: [id number]  HPI   [name] is here in follow up of his cervical SCI. He is beginning to stand and taking a few steps with therapy. The gabapentin helped his leg pain quite a bit. Bowels and bladder are working.  He feels that his left knee range of motion perhaps is a bit better.  He certainly has more extension.  Right knee has always been more functional in the left for the most part.  He has interest in returning to drive.  Home health continues to work with him and is looking at another month or so of work.  Pain levels in general have been under control.      Pain Inventory Average Pain 2 Pain Right Now 0 My pain is aching  In the last 24 hours, has pain interfered with the following? General activity 0 Relation with others 0 Enjoyment of life 0 What TIME of day is your pain at its worst? n/a Sleep (in general) Fair  Pain is worse with: standing Pain improves with: therapy/exercise and medication Relief from Meds: 7  Mobility use a walker how many minutes can you walk? 0 do you drive?  no use a wheelchair  Function retired  Neuro/Psych trouble walking spasms  Prior Studies Any changes since last visit?  no  Physicians involved in your care Any changes since last visit?  no Primary care Dr. Annice PihSteven Hicks   Family History  Problem Relation Age of Onset  . Liver disease Mother    Social History   Socioeconomic History  . Marital status: Married    Spouse name: Not on file  . Number of children: Not on file  . Years of education: Not on file  . Highest education level: Not on file  Occupational History  . Occupation: Runner, broadcasting/film/videoteacher  Social Needs  . Financial resource strain: Not on file  . Food insecurity:    Worry: Not on file    Inability: Not on file  . Transportation needs:    Medical: Not on file    Non-medical: Not on file  Tobacco Use  . Smoking status: Former  Smoker    Packs/day: 1.00    Years: 13.00    Pack years: 13.00    Types: Cigarettes    Last attempt to quit: 07/13/1977    Years since quitting: 40.7  . Smokeless tobacco: Never Used  Substance and Sexual Activity  . Alcohol use: Yes    Alcohol/week: 0.0 standard drinks    Comment: holidays only  . Drug use: No  . Sexual activity: Not on file  Lifestyle  . Physical activity:    Days per week: Not on file    Minutes per session: Not on file  . Stress: Not on file  Relationships  . Social connections:    Talks on phone: Not on file    Gets together: Not on file    Attends religious service: Not on file    Active member of club or organization: Not on file    Attends meetings of clubs or organizations: Not on file    Relationship status: Not on file  Other Topics Concern  . Not on file  Social History Narrative   Married for 44 years. He has 3 children and 5 grandchildren. They also 5 great grandchildren. He lives with his wife. 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 -> College education   Occasional beer or wine   No smoking history or illicit drug use.   One hour water aerobics sessions 3 days a week. Has not started back yet read from recent illness sounds.   Past Surgical History:  Procedure Laterality Date  . CARDIAC CATHETERIZATION N/A 09/19/2016   Procedure: Left Heart Cath and Coronary Angiography;  Surgeon: Orpah Cobb, MD;  Location: [facility] INVASIVE CV LAB;  Service: Cardiovascular: 95% proximal Ramus Intermedius --> PCI  . CARDIAC CATHETERIZATION N/A 09/19/2016   Procedure: Coronary Stent Intervention;  Surgeon: Yvonne Kendall, MD;  Location: [facility] INVASIVE CV LAB;  Service: Cardiovascular: 95% ramus intermedius;  Resolute Onyx 2.75 x 18 mm drug-eluting stent  . CYSTOSCOPY/RETROGRADE/URETEROSCOPY/STONE EXTRACTION WITH BASKET  1610,9604   ureteral stone   . MENISCUS REPAIR Left    knee open meniscetomy  . TRANSTHORACIC ECHOCARDIOGRAM  2004   no  lvh nl ejection fraction  . TRANSTHORACIC ECHOCARDIOGRAM  09/19/2016   In setting of NSTEMI:  EF 45-50% with diffuse hypokinesis. GR 2 DD. Mild biatrial enlargement.   Past Medical History:  Diagnosis Date  . Arthritis    "knees" (12/17/2017)  . CAD S/P percutaneous coronary angioplasty 09/20/2016   Nstemi 08/2016. Dr. Algie Coffer. DES- brilinta and asa. Requests change to [facility] cardiology; 95% Ramus -> PCI Resolute Onyx DES 2.75 x 18  . Central cord syndrome (HCC) 12/17/2017  . Chronic combined systolic and diastolic heart failure (HCC) 10/09/2016   EF 45% and grade II diastolic after nstemi  . Diet-controlled diabetes mellitus (HCC)   . DJD (degenerative joint disease)   . Gout   . High cholesterol   . Hypertension   . NSTEMI (non-ST elevated myocardial infarction) (HCC) 09/20/2016   95% Ramus - > PCI   . Obesity   . Pulmonary embolism (HCC) ~ 2015   BP (!) 93/58   Pulse 84   Ht 6' (1.829 m)   Wt (!) 360 lb (163.3 kg) Comment: pt reported, in wheelchair  SpO2 92%   BMI 48.82 kg/m   Opioid Risk Score:   Fall Risk Score:  `1  Depression screen PHQ 2/9  Depression screen [facility] 2/9 04/12/2018 03/10/2018 12/16/2016 10/09/2016 12/13/2014  Decreased Interest 0 0 0 0 0  Down, Depressed, Hopeless 0 0 0 0 0  PHQ - 2 Score 0 0 0 0 0    Review of Systems  Constitutional: Negative.   HENT: Negative.   Eyes: Negative.   Respiratory: Negative.   Cardiovascular: Negative.   Gastrointestinal: Negative.   Endocrine: Negative.   Genitourinary: Negative.   Musculoskeletal: Negative.   Skin: Negative.   Allergic/Immunologic: Negative.   Neurological: Negative.   Hematological: Bruises/bleeds easily.  Psychiatric/Behavioral: Negative.   All other systems reviewed and are negative.      Objective:   Physical Exam General: No acute distress, obese HEENT: EOMI, oral membranes moist Cards: reg rate  Chest: normal effort Abdomen: Soft, NT, ND Skin: dry, intact Extremities: 1+ edema RLE and  2+ LLE  Musculoskeletal: LEFT knee with lmited ROM, can extend to -10 degrees Neurological: He is alert and oriented  Motor: LUE  4+ to 5 out of 5 proximal distal RUE 3- to 3 out of 5 shoulder abduction but this is largely due to pain/OA---when ROM accounted for strength is nearly 5/5, 5 out of 5 biceps triceps wrist and hand intrinsics LLE: 3+/5 HF, 3 out of 5 KE, lmited by knee. 4/5 ADF/PF RLE: 3+/5 hip  flexion, 3-4 out of 5/5 knee extension to 4/5 distally Senses light touch and pain in all 4 limbs   Psychiatric: Pleasant and appropriate          Assessment & Plan:  1. Functional deficits secondary to C4 Spinal cord injury, motor and sensory incomplete   -continue with HH therapies with transition to outpt therapy at the end of September. The patient [facility] to neuro-rehab  -provided driving instructions today. If he passes my protocol, he should be ok for local driving  -had extensive discussion about pathophysiology of his injury and recovery 2. DVT/PE/Anticoagulation: xarelto 3. Pain Management: .  -maintain gabapentin 100 mg bid and 200mg  qhs  for neuropathic pain            -  Voltaren gel for knee pain.  Consider the injections            -  ice for knee pain and heat to help stretch hamstrings 4.  Skin/Wound Care: Per home health RN.     5. T2DM: Per primary 6 CAD:  Lipitor, Plavix, and metoprolol  7. Morbid obesity: Body mass index is 51.76 kg/m.  -Again to reach desired functional goals weight loss will be important    8. Combined systolic/diastolic CHF:   Continue Lasix, Plavix, statin and metoprolol.    9. HTN: on the low side. Needs follow up with primary 10. H/o BPH/neurogenic bladder: Reports voiding without difficulty--on Hytrin.    15  minutes of face to face patient care time were spent during this visit. All questions were encouraged and answered.  Follow-up in 3  month's time

## 2018-04-16 ENCOUNTER — Telehealth: Payer: Self-pay

## 2018-04-16 NOTE — Telephone Encounter (Signed)
Amber, PT/[facility] called requesting verbal orders to continue PT with patient 2wk4, 1wk1 for walking, strength, indurnce and balance. Is this okay?

## 2018-04-16 NOTE — Telephone Encounter (Signed)
Amber notified

## 2018-04-16 NOTE — Telephone Encounter (Signed)
Yes, proceed

## 2018-04-27 ENCOUNTER — Telehealth: Payer: Self-pay | Admitting: Family Medicine

## 2018-04-27 NOTE — Telephone Encounter (Signed)
See note

## 2018-04-27 NOTE — Telephone Encounter (Signed)
Copied from CRM [id number]. Topic: Quick Communication - See Telephone Encounter >> Apr 27, 2018  3:58 PM Tamela Oddi, NT wrote: CRM for notification. See Telephone encounter for: 04/27/18. Amber (PT)  [facility] called and  wanted to let Dr. Durene Cal know the patient has a spot that came up on his rt ankle . That spot has open and is oozing a little bit. She is unsure if they patient needs an appt or if there is something that the patient can out on it  Please call  Amber back [phone]

## 2018-04-27 NOTE — Telephone Encounter (Signed)
Patient with diabetic wound- I would suggest we get him in with Dr. Lajoyce Corners at [facility] orthopedics- you may place referral. Would love for him to be seen this week or next. Very high risk patient. Very important he make it to visit when scheduled

## 2018-04-27 NOTE — Telephone Encounter (Signed)
Dr. Hunter-please see message and advise  Thanks 

## 2018-04-28 ENCOUNTER — Other Ambulatory Visit: Payer: Self-pay

## 2018-04-28 DIAGNOSIS — E13621 Other specified diabetes mellitus with foot ulcer: Secondary | ICD-10-CM

## 2018-04-28 DIAGNOSIS — L97519 Non-pressure chronic ulcer of other part of right foot with unspecified severity: Secondary | ICD-10-CM

## 2018-04-28 NOTE — Telephone Encounter (Signed)
You may have already done this but please call patient to inform him of plan

## 2018-04-28 NOTE — Telephone Encounter (Signed)
Urgent referral placed for [facility]-Dr. Aldean Baker.

## 2018-04-28 NOTE — Telephone Encounter (Signed)
Spoke to patient and he said [facility] has already called him to schedule appt.

## 2018-05-04 ENCOUNTER — Encounter (INDEPENDENT_AMBULATORY_CARE_PROVIDER_SITE_OTHER): Payer: Self-pay | Admitting: Orthopedic Surgery

## 2018-05-04 ENCOUNTER — Ambulatory Visit (INDEPENDENT_AMBULATORY_CARE_PROVIDER_SITE_OTHER): Payer: Medicare Other | Admitting: Orthopedic Surgery

## 2018-05-04 VITALS — Ht 72.0 in | Wt 360.0 lb

## 2018-05-04 DIAGNOSIS — Z6841 Body Mass Index (BMI) 40.0 and over, adult: Secondary | ICD-10-CM

## 2018-05-04 DIAGNOSIS — E1142 Type 2 diabetes mellitus with diabetic polyneuropathy: Secondary | ICD-10-CM | POA: Diagnosis not present

## 2018-05-04 DIAGNOSIS — L97919 Non-pressure chronic ulcer of unspecified part of right lower leg with unspecified severity: Secondary | ICD-10-CM

## 2018-05-04 DIAGNOSIS — I87331 Chronic venous hypertension (idiopathic) with ulcer and inflammation of right lower extremity: Secondary | ICD-10-CM | POA: Diagnosis not present

## 2018-05-04 DIAGNOSIS — B351 Tinea unguium: Secondary | ICD-10-CM

## 2018-05-04 NOTE — Progress Notes (Signed)
See note

## 2018-05-04 NOTE — Progress Notes (Signed)
Office Visit Note   Patient: Franklin Hicks           Date of Birth: 1951-04-16           MRN: [id number] Visit Date: 05/04/2018              Requested by: Shelva Majestic, MD [address] [place], [place] [address] PCP: Shelva Majestic, MD  Chief Complaint  Patient presents with  . Right Foot - Pain    NP, Diabetic Ulcer Right foot      HPI: Patient is a 67 year old gentleman with BMI of approximately 50 with diabetic insensate neuropathy with venous stasis insufficiency and ulcer on the posterior lateral aspect of the right calf as well as onychomycotic nails x10.  Patient is on blood thinners he is status post multiple medical interventions with stent placements as well as a central cord injury secondary to a fall and residual weakness on both lower extremities and inability to ambulate at this time.  Assessment & Plan: Visit Diagnoses:  1. Diabetic polyneuropathy associated with type 2 diabetes mellitus (HCC)   2. Idiopathic chronic venous hypertension of right lower extremity with ulcer and inflammation (HCC)   3. Body mass index 50.0-59.9, adult (HCC)   4. Morbid obesity (HCC)     Plan: Recommended compression for the venous ulcer discussed that we could proceed with serial compression wraps versus compression stockings patient states he would like to proceed with his stockings.  He will need a double extra-large sock he will wear 2 layers during the day and 1 layer at night he will wear 1 layer on the left lower extremity.  Follow-Up Instructions: Return in about 2 weeks (around 05/18/2018).   Ortho Exam  Patient is alert, oriented, no adenopathy, well-dressed, normal affect, normal respiratory effort. Examination patient ambulates in a wheelchair.  He has brawny skin color changes of both lower extremities from chronic venous insufficiency he has pitting edema in both feet and legs.  He does have palpable pulses.  He has thickened discolored onychomycotic nails that  he is unable to safely trim on his own and the nails were trimmed x10 without complications.  Patient has a venous insufficiency ulcer posterior lateral aspect of the right calf which is 2 cm in diameter 1 mm deep.  The right calf is 46 cm in circumference left calf is 50 cm in circumference.  Patient has dry cracked fungal skin on both heels.  Imaging: No results found. No images are attached to the encounter.  Labs: Lab Results  Component Value Date   HGBA1C 6.5 03/30/2018   HGBA1C 6.5 (H) 12/13/2017   HGBA1C 6.8 (H) 09/24/2017     Lab Results  Component Value Date   ALBUMIN 3.6 03/30/2018   ALBUMIN 2.7 (L) 12/18/2017   ALBUMIN 3.0 (L) 12/14/2017    Body mass index is 48.82 kg/m.  Orders:  No orders of the defined types were placed in this encounter.  No orders of the defined types were placed in this encounter.    Procedures: No procedures performed  Clinical Data: No additional findings.  ROS:  All other systems negative, except as noted in the HPI. Review of Systems  Objective: Vital Signs: Ht 6' (1.829 m)   Wt (!) 360 lb (163.3 kg)   BMI 48.82 kg/m   Specialty Comments:  No specialty comments available.  PMFS History: Patient Active Problem List   Diagnosis Date Noted  . Pain of left heel   .  C4 spinal cord injury, sequela (HCC) 12/18/2017  . Tetraplegia (HCC) 12/18/2017  . Acute pulmonary embolism (HCC) 12/18/2017  . Aortic aneurysm (HCC) 12/16/2017  . Cord compression (HCC) 12/13/2017  . Chronic combined systolic and diastolic heart failure (HCC) 10/09/2016  . CAD S/P percutaneous coronary angioplasty 09/20/2016  . Erectile dysfunction 07/30/2015  . Morbid obesity with BMI of 50.0-59.9, adult (HCC) 07/18/2013  . History of pulmonary embolism 07/13/2013  . Angioedema of lips 07/13/2013  . Multinodular goiter 07/13/2013  . Dyslipidemia, goal LDL below 70 10/02/2011  . SOB (shortness of breath) 10/02/2011  . BPH with urinary obstruction  07/17/2010  . Diabetes mellitus type 2, controlled (HCC) 07/17/2010  . Bilateral lower extremity edema 12/08/2008  . NEUROPATHY, IDIOPATHIC PERIPHERAL NEC 05/31/2007  . Osteoarthritis 03/22/2007  . Gout 03/15/2007  . Essential hypertension 03/15/2007   Past Medical History:  Diagnosis Date  . Arthritis    "knees" (12/17/2017)  . CAD S/P percutaneous coronary angioplasty 09/20/2016   Nstemi 08/2016. Dr. Algie Coffer. DES- brilinta and asa. Requests change to [facility] cardiology; 95% Ramus -> PCI Resolute Onyx DES 2.75 x 18  . Central cord syndrome (HCC) 12/17/2017  . Chronic combined systolic and diastolic heart failure (HCC) 10/09/2016   EF 45% and grade II diastolic after nstemi  . Diet-controlled diabetes mellitus (HCC)   . DJD (degenerative joint disease)   . Gout   . High cholesterol   . Hypertension   . NSTEMI (non-ST elevated myocardial infarction) (HCC) 09/20/2016   95% Ramus - > PCI   . Obesity   . Pulmonary embolism (HCC) ~ 2015    Family History  Problem Relation Age of Onset  . Liver disease Mother     Past Surgical History:  Procedure Laterality Date  . CARDIAC CATHETERIZATION N/A 09/19/2016   Procedure: Left Heart Cath and Coronary Angiography;  Surgeon: Orpah Cobb, MD;  Location: [facility] INVASIVE CV LAB;  Service: Cardiovascular: 95% proximal Ramus Intermedius --> PCI  . CARDIAC CATHETERIZATION N/A 09/19/2016   Procedure: Coronary Stent Intervention;  Surgeon: Yvonne Kendall, MD;  Location[facility] INVASIVE CV LAB;  Service: Cardiovascular: 95% ramus intermedius;  Resolute Onyx 2.75 x 18 mm drug-eluting stent  . CYSTOSCOPY/RETROGRADE/URETEROSCOPY/STONE EXTRACTION WITH BASKET  2956,2130   ureteral stone   . MENISCUS REPAIR Left    knee open meniscetomy  . TRANSTHORACIC ECHOCARDIOGRAM  2004   no lvh nl ejection fraction  . TRANSTHORACIC ECHOCARDIOGRAM  09/19/2016   In setting of NSTEMI:  EF 45-50% with diffuse hypokinesis. GR 2 DD. Mild biatrial enlargement.   Social History    Occupational History  . Occupation: Runner, broadcasting/film/video  Tobacco Use  . Smoking status: Former Smoker    Packs/day: 1.00    Years: 13.00    Pack years: 13.00    Types: Cigarettes    Last attempt to quit: 07/13/1977    Years since quitting: 40.8  . Smokeless tobacco: Never Used  Substance and Sexual Activity  . Alcohol use: Yes    Alcohol/week: 0.0 standard drinks    Comment: holidays only  . Drug use: No  . Sexual activity: Not on file

## 2018-05-04 NOTE — Progress Notes (Deleted)
Office Visit Note   Patient: Franklin Hicks           Date of Birth: 1951-04-02           MRN: [id number] Visit Date: 05/04/2018              Requested by: Shelva Majestic, MD [address] [place], [place] [address] PCP: Shelva Majestic, MD  Chief Complaint  Patient presents with  . Right Foot - Pain    NP, Diabetic Ulcer Right foot      HPI: ***  Assessment & Plan: Visit Diagnoses:  1. Diabetic polyneuropathy associated with type 2 diabetes mellitus (HCC)   2. Idiopathic chronic venous hypertension of right lower extremity with ulcer and inflammation (HCC)   3. Body mass index 50.0-59.9, adult (HCC)   4. Morbid obesity (HCC)   5. Onychomycosis     Plan: ***  Follow-Up Instructions: Return in about 2 weeks (around 05/18/2018).   Ortho Exam  Patient is alert, oriented, no adenopathy, well-dressed, normal affect, normal respiratory effort. ***  Imaging: No results found. No images are attached to the encounter.  Labs: Lab Results  Component Value Date   HGBA1C 6.5 03/30/2018   HGBA1C 6.5 (H) 12/13/2017   HGBA1C 6.8 (H) 09/24/2017     Lab Results  Component Value Date   ALBUMIN 3.6 03/30/2018   ALBUMIN 2.7 (L) 12/18/2017   ALBUMIN 3.0 (L) 12/14/2017    Body mass index is 48.82 kg/m.  Orders:  No orders of the defined types were placed in this encounter.  No orders of the defined types were placed in this encounter.    Procedures: No procedures performed  Clinical Data: No additional findings.  ROS:  All other systems negative, except as noted in the HPI. Review of Systems  Objective: Vital Signs: Ht 6' (1.829 m)   Wt (!) 360 lb (163.3 kg)   BMI 48.82 kg/m   Specialty Comments:  No specialty comments available.  PMFS History: Patient Active Problem List   Diagnosis Date Noted  . Pain of left heel   . C4 spinal cord injury, sequela (HCC) 12/18/2017  . Tetraplegia (HCC) 12/18/2017  . Acute pulmonary embolism (HCC)  12/18/2017  . Aortic aneurysm (HCC) 12/16/2017  . Cord compression (HCC) 12/13/2017  . Chronic combined systolic and diastolic heart failure (HCC) 10/09/2016  . CAD S/P percutaneous coronary angioplasty 09/20/2016  . Erectile dysfunction 07/30/2015  . Morbid obesity with BMI of 50.0-59.9, adult (HCC) 07/18/2013  . History of pulmonary embolism 07/13/2013  . Angioedema of lips 07/13/2013  . Multinodular goiter 07/13/2013  . Dyslipidemia, goal LDL below 70 10/02/2011  . SOB (shortness of breath) 10/02/2011  . BPH with urinary obstruction 07/17/2010  . Diabetes mellitus type 2, controlled (HCC) 07/17/2010  . Bilateral lower extremity edema 12/08/2008  . NEUROPATHY, IDIOPATHIC PERIPHERAL NEC 05/31/2007  . Osteoarthritis 03/22/2007  . Gout 03/15/2007  . Essential hypertension 03/15/2007   Past Medical History:  Diagnosis Date  . Arthritis    "knees" (12/17/2017)  . CAD S/P percutaneous coronary angioplasty 09/20/2016   Nstemi 08/2016. Dr. Algie Coffer. DES- brilinta and asa. Requests change to [facility] cardiology; 95% Ramus -> PCI Resolute Onyx DES 2.75 x 18  . Central cord syndrome (HCC) 12/17/2017  . Chronic combined systolic and diastolic heart failure (HCC) 10/09/2016   EF 45% and grade II diastolic after nstemi  . Diet-controlled diabetes mellitus (HCC)   . DJD (degenerative joint disease)   . Gout   .  High cholesterol   . Hypertension   . NSTEMI (non-ST elevated myocardial infarction) (HCC) 09/20/2016   95% Ramus - > PCI   . Obesity   . Pulmonary embolism (HCC) ~ 2015    Family History  Problem Relation Age of Onset  . Liver disease Mother     Past Surgical History:  Procedure Laterality Date  . CARDIAC CATHETERIZATION N/A 09/19/2016   Procedure: Left Heart Cath and Coronary Angiography;  Surgeon: Orpah Cobb, MD;  Location: [facility] INVASIVE CV LAB;  Service: Cardiovascular: 95% proximal Ramus Intermedius --> PCI  . CARDIAC CATHETERIZATION N/A 09/19/2016   Procedure: Coronary Stent  Intervention;  Surgeon: Yvonne Kendall, MD;  Location: [facility] INVASIVE CV LAB;  Service: Cardiovascular: 95% ramus intermedius;  Resolute Onyx 2.75 x 18 mm drug-eluting stent  . CYSTOSCOPY/RETROGRADE/URETEROSCOPY/STONE EXTRACTION WITH BASKET  4098,1191   ureteral stone   . MENISCUS REPAIR Left    knee open meniscetomy  . TRANSTHORACIC ECHOCARDIOGRAM  2004   no lvh nl ejection fraction  . TRANSTHORACIC ECHOCARDIOGRAM  09/19/2016   In setting of NSTEMI:  EF 45-50% with diffuse hypokinesis. GR 2 DD. Mild biatrial enlargement.   Social History   Occupational History  . Occupation: Runner, broadcasting/film/video  Tobacco Use  . Smoking status: Former Smoker    Packs/day: 1.00    Years: 13.00    Pack years: 13.00    Types: Cigarettes    Last attempt to quit: 07/13/1977    Years since quitting: 40.8  . Smokeless tobacco: Never Used  Substance and Sexual Activity  . Alcohol use: Yes    Alcohol/week: 0.0 standard drinks    Comment: holidays only  . Drug use: No  . Sexual activity: Not on file

## 2018-05-07 ENCOUNTER — Telehealth: Payer: Self-pay

## 2018-05-07 NOTE — Telephone Encounter (Signed)
Franklin [facility]inSusan Hicks [facility] requesting verbal orders for additional visits, requesting 1xwk X 1wk followed by 2xwk X 1wk followed by 1xwk X 1wk.  Called her back and approved verbal orders.

## 2018-05-10 ENCOUNTER — Other Ambulatory Visit: Payer: Self-pay | Admitting: Family Medicine

## 2018-05-14 ENCOUNTER — Telehealth: Payer: Self-pay | Admitting: *Deleted

## 2018-05-14 DIAGNOSIS — S14104S Unspecified injury at C4 level of cervical spinal cord, sequela: Secondary | ICD-10-CM

## 2018-05-14 DIAGNOSIS — G8250 Quadriplegia, unspecified: Secondary | ICD-10-CM

## 2018-05-14 NOTE — Telephone Encounter (Signed)
[facility] referral be placed for outpt PT rehab at neuro rehab.  Referral placed.

## 2018-05-19 ENCOUNTER — Ambulatory Visit (INDEPENDENT_AMBULATORY_CARE_PROVIDER_SITE_OTHER): Payer: Medicare Other | Admitting: Physician Assistant

## 2018-05-19 ENCOUNTER — Encounter (INDEPENDENT_AMBULATORY_CARE_PROVIDER_SITE_OTHER): Payer: Self-pay | Admitting: Orthopedic Surgery

## 2018-05-19 VITALS — Ht 72.0 in | Wt 360.0 lb

## 2018-05-19 DIAGNOSIS — L97919 Non-pressure chronic ulcer of unspecified part of right lower leg with unspecified severity: Secondary | ICD-10-CM

## 2018-05-19 DIAGNOSIS — E1142 Type 2 diabetes mellitus with diabetic polyneuropathy: Secondary | ICD-10-CM | POA: Diagnosis not present

## 2018-05-19 DIAGNOSIS — I87331 Chronic venous hypertension (idiopathic) with ulcer and inflammation of right lower extremity: Secondary | ICD-10-CM

## 2018-05-19 DIAGNOSIS — Z6841 Body Mass Index (BMI) 40.0 and over, adult: Secondary | ICD-10-CM | POA: Diagnosis not present

## 2018-05-19 NOTE — Progress Notes (Signed)
Office Visit Note   Patient: Franklin Hicks           Date of Birth: 07/08/1951           MRN: [id number] Visit Date: 05/19/2018              Requested by: Shelva MajesticHunter, Stephen O, MD [address] [place], [place] [address] PCP: Shelva MajesticHunter, Stephen O, MD  Chief Complaint  Patient presents with  . Right Foot - Follow-up      HPI: Patient is a 67 year old male who seen for follow-up for his bilateral lower extremity edema, venous insufficiency, diabetic polyneuropathy.  He has been wearing compression silver socks and his wound over the right posterior calf has healed.  His edema is markedly improved and he is very pleased with his progress.  He is working on improving diet with increased protein intake and is wearing compression on both lower extremities as discussed at last visit.  Assessment & Plan: Visit Diagnoses:  1. Idiopathic chronic venous hypertension of right lower extremity with ulcer and inflammation (HCC)   2. Diabetic polyneuropathy associated with type 2 diabetes mellitus (HCC)   3. BMI 45.0-49.9, adult (HCC)   4. Morbid obesity due to excess calories (HCC)     Plan: Continue compression stockings bilateral lower extremities utilizing a double extra-large sock wearing 2 L during the day and 1 layer at night.  His venous ulcer over the right lower extremity does appear to have resolved.  Moisturize bilateral lower extremities with cocoa butter or Shea butter or other non-scented lotion of his choice. He did have a small amount of skin irritation over the right posterior calf from the stocking and he was provided a silicon dressing to try to protect this from further abrasion.  He will follow-up here on an as-needed basis. Follow-Up Instructions: Return if symptoms worsen or fail to improve.   Ortho Exam  Patient is alert, oriented, no adenopathy, well-dressed, normal affect, normal respiratory effort. Patient has venous stasis changes of bilateral lower extremities with  brawny edema although this is markedly improved from his previous visit.  He has palpable pulses bilaterally.  He has hemosiderin staining and thickened skin consistent with stasis dermatitis.  He has very dry flaking skin over both lower extremities.  Imaging: No results found. No images are attached to the encounter.  Labs: Lab Results  Component Value Date   HGBA1C 6.5 03/30/2018   HGBA1C 6.5 (H) 12/13/2017   HGBA1C 6.8 (H) 09/24/2017     Lab Results  Component Value Date   ALBUMIN 3.6 03/30/2018   ALBUMIN 2.7 (L) 12/18/2017   ALBUMIN 3.0 (L) 12/14/2017    Body mass index is 48.82 kg/m.  Orders:  No orders of the defined types were placed in this encounter.  No orders of the defined types were placed in this encounter.    Procedures: No procedures performed  Clinical Data: No additional findings.  ROS:  All other systems negative, except as noted in the HPI. Review of Systems  Objective: Vital Signs: Ht 6' (1.829 m)   Wt (!) 360 lb (163.3 kg)   BMI 48.82 kg/m   Specialty Comments:  No specialty comments available.  PMFS History: Patient Active Problem List   Diagnosis Date Noted  . Pain of left heel   . C4 spinal cord injury, sequela (HCC) 12/18/2017  . Tetraplegia (HCC) 12/18/2017  . Acute pulmonary embolism (HCC) 12/18/2017  . Aortic aneurysm (HCC) 12/16/2017  . Cord compression (  HCC) 12/13/2017  . Chronic combined systolic and diastolic heart failure (HCC) 10/09/2016  . CAD S/P percutaneous coronary angioplasty 09/20/2016  . Erectile dysfunction 07/30/2015  . Morbid obesity with BMI of 50.0-59.9, adult (HCC) 07/18/2013  . History of pulmonary embolism 07/13/2013  . Angioedema of lips 07/13/2013  . Multinodular goiter 07/13/2013  . Dyslipidemia, goal LDL below 70 10/02/2011  . SOB (shortness of breath) 10/02/2011  . BPH with urinary obstruction 07/17/2010  . Diabetes mellitus type 2, controlled (HCC) 07/17/2010  . Bilateral lower extremity  edema 12/08/2008  . NEUROPATHY, IDIOPATHIC PERIPHERAL NEC 05/31/2007  . Osteoarthritis 03/22/2007  . Gout 03/15/2007  . Essential hypertension 03/15/2007   Past Medical History:  Diagnosis Date  . Arthritis    "knees" (12/17/2017)  . CAD S/P percutaneous coronary angioplasty 09/20/2016   Nstemi 08/2016. Dr. Algie Coffer. DES- brilinta and asa. Requests change to [facility] cardiology; 95% Ramus -> PCI Resolute Onyx DES 2.75 x 18  . Central cord syndrome (HCC) 12/17/2017  . Chronic combined systolic and diastolic heart failure (HCC) 10/09/2016   EF 45% and grade II diastolic after nstemi  . Diet-controlled diabetes mellitus (HCC)   . DJD (degenerative joint disease)   . Gout   . High cholesterol   . Hypertension   . NSTEMI (non-ST elevated myocardial infarction) (HCC) 09/20/2016   95% Ramus - > PCI   . Obesity   . Pulmonary embolism (HCC) ~ 2015    Family History  Problem Relation Age of Onset  . Liver disease Mother     Past Surgical History:  Procedure Laterality Date  . CARDIAC CATHETERIZATION N/A 09/19/2016   Procedure: Left Heart Cath and Coronary Angiography;  Surgeon: Orpah Cobb, MD;  Location: [facility] INVASIVE CV LAB;  Service: Cardiovascular: 95% proximal Ramus Intermedius --> PCI  . CARDIAC CATHETERIZATION N/A 09/19/2016   Procedure: Coronary Stent Intervention;  Surgeon: Yvonne Kendall, MD;  Location: [facility] INVASIVE CV LAB;  Service: Cardiovascular: 95% ramus intermedius;  Resolute Onyx 2.75 x 18 mm drug-eluting stent  . CYSTOSCOPY/RETROGRADE/URETEROSCOPY/STONE EXTRACTION WITH BASKET  4696,2952   ureteral stone   . MENISCUS REPAIR Left    knee open meniscetomy  . TRANSTHORACIC ECHOCARDIOGRAM  2004   no lvh nl ejection fraction  . TRANSTHORACIC ECHOCARDIOGRAM  09/19/2016   In setting of NSTEMI:  EF 45-50% with diffuse hypokinesis. GR 2 DD. Mild biatrial enlargement.   Social History   Occupational History  . Occupation: Runner, broadcasting/film/video  Tobacco Use  . Smoking status: Former Smoker     Packs/day: 1.00    Years: 13.00    Pack years: 13.00    Types: Cigarettes    Last attempt to quit: 07/13/1977    Years since quitting: 40.8  . Smokeless tobacco: Never Used  Substance and Sexual Activity  . Alcohol use: Yes    Alcohol/week: 0.0 standard drinks    Comment: holidays only  . Drug use: No  . Sexual activity: Not on file

## 2018-05-26 ENCOUNTER — Ambulatory Visit: Payer: Medicare Other | Admitting: Cardiology

## 2018-05-26 ENCOUNTER — Encounter: Payer: Self-pay | Admitting: Rehabilitative and Restorative Service Providers"

## 2018-05-26 ENCOUNTER — Encounter: Payer: Self-pay | Admitting: Cardiology

## 2018-05-26 ENCOUNTER — Other Ambulatory Visit: Payer: Self-pay

## 2018-05-26 ENCOUNTER — Ambulatory Visit
Payer: Medicare Other | Attending: Physical Medicine & Rehabilitation | Admitting: Rehabilitative and Restorative Service Providers"

## 2018-05-26 VITALS — BP 130/57 | HR 82 | Ht 72.0 in

## 2018-05-26 DIAGNOSIS — I712 Thoracic aortic aneurysm, without rupture, unspecified: Secondary | ICD-10-CM

## 2018-05-26 DIAGNOSIS — M25612 Stiffness of left shoulder, not elsewhere classified: Secondary | ICD-10-CM | POA: Insufficient documentation

## 2018-05-26 DIAGNOSIS — R208 Other disturbances of skin sensation: Secondary | ICD-10-CM | POA: Diagnosis present

## 2018-05-26 DIAGNOSIS — R2689 Other abnormalities of gait and mobility: Secondary | ICD-10-CM | POA: Insufficient documentation

## 2018-05-26 DIAGNOSIS — I10 Essential (primary) hypertension: Secondary | ICD-10-CM

## 2018-05-26 DIAGNOSIS — E785 Hyperlipidemia, unspecified: Secondary | ICD-10-CM | POA: Diagnosis not present

## 2018-05-26 DIAGNOSIS — R600 Localized edema: Secondary | ICD-10-CM

## 2018-05-26 DIAGNOSIS — M25611 Stiffness of right shoulder, not elsewhere classified: Secondary | ICD-10-CM | POA: Diagnosis present

## 2018-05-26 DIAGNOSIS — M6281 Muscle weakness (generalized): Secondary | ICD-10-CM | POA: Insufficient documentation

## 2018-05-26 DIAGNOSIS — I2699 Other pulmonary embolism without acute cor pulmonale: Secondary | ICD-10-CM

## 2018-05-26 DIAGNOSIS — R2681 Unsteadiness on feet: Secondary | ICD-10-CM | POA: Diagnosis present

## 2018-05-26 DIAGNOSIS — Z9861 Coronary angioplasty status: Secondary | ICD-10-CM

## 2018-05-26 DIAGNOSIS — I5042 Chronic combined systolic (congestive) and diastolic (congestive) heart failure: Secondary | ICD-10-CM

## 2018-05-26 DIAGNOSIS — I2510 Atherosclerotic heart disease of native coronary artery without angina pectoris: Secondary | ICD-10-CM | POA: Diagnosis not present

## 2018-05-26 MED ORDER — ASPIRIN EC 81 MG PO TBEC: 81.0000 mg | DELAYED_RELEASE_TABLET | Freq: Every day | ORAL | 3 refills | Status: DC

## 2018-05-26 NOTE — Patient Instructions (Addendum)
MEDICATION INSTRUCTIONS   ONCE  YOU COMPLETE PLAVIX ( CLOPIDOGREL) STOP MEDICATION AND START ASPIRIN 81 MG ONE TABLET A DAY.   CONTINUE TAKING XARELTO     Your physician wants you to follow-up in 12 MONTH WITH DR HARDING. You will receive a reminder letter in the mail two months in advance. If you don't receive a letter, please call our office to schedule the follow-up appointment.    If you need a refill on your cardiac medications before your next appointment, please call your pharmacy.

## 2018-05-26 NOTE — Therapy (Signed)
[facility] [address] [place], [place], [address] Phone: [phone]   Fax:  [phone]  Physical Therapy Evaluation  Patient Details  Name: Franklin Hicks MRN: [id number] Date of Birth: 05/23/1951 Referring Provider (PT): Faith Rogue, MD   Encounter Date: 05/26/2018  PT End of Session - 05/26/18 1734    Visit Number  1    Number of Visits  25   eval + 24 visits   Date for PT Re-Evaluation  08/24/18    Authorization Type  $20 copay [employer] medicare    PT Start Time  1240    PT Stop Time  1324    PT Time Calculation (min)  44 min       Past Medical History:  Diagnosis Date  . Arthritis    "knees" (12/17/2017)  . CAD S/P percutaneous coronary angioplasty 09/20/2016   Nstemi 08/2016. Dr. Algie Coffer. DES- brilinta and asa. Requests change to [facility] cardiology; 95% Ramus -> PCI Resolute Onyx DES 2.75 x 18  . Central cord syndrome (HCC) 12/17/2017  . Chronic combined systolic and diastolic heart failure (HCC) 10/09/2016   EF 45% and grade II diastolic after nstemi  . Diet-controlled diabetes mellitus (HCC)   . DJD (degenerative joint disease)   . Gout   . High cholesterol   . Hypertension   . NSTEMI (non-ST elevated myocardial infarction) (HCC) 09/20/2016   95% Ramus - > PCI   . Obesity   . Pulmonary embolism (HCC) ~ 2015    Past Surgical History:  Procedure Laterality Date  . CARDIAC CATHETERIZATION N/A 09/19/2016   Procedure: Left Heart Cath and Coronary Angiography;  Surgeon: Orpah Cobb, MD;  Location: [facility] INVASIVE CV LAB;  Service: Cardiovascular: 95% proximal Ramus Intermedius --> PCI  . CARDIAC CATHETERIZATION N/A 09/19/2016   Procedure: Coronary Stent Intervention;  Surgeon: Yvonne Kendall, MD;  Location: [facility] INVASIVE CV LAB;  Service: Cardiovascular: 95% ramus intermedius;  Resolute Onyx 2.75 x 18 mm drug-eluting stent  . CYSTOSCOPY/RETROGRADE/URETEROSCOPY/STONE EXTRACTION WITH BASKET  8413,2440   ureteral stone    . MENISCUS REPAIR Left    knee open meniscetomy  . TRANSTHORACIC ECHOCARDIOGRAM  2004   no lvh nl ejection fraction  . TRANSTHORACIC ECHOCARDIOGRAM  09/19/2016   In setting of NSTEMI:  EF 45-50% with diffuse hypokinesis. GR 2 DD. Mild biatrial enlargement.    There were no vitals filed for this visit.   Subjective Assessment - 05/26/18 1243    Subjective  The patient is a 67 year old male s/p fall 12/13/17 with spinal cord contusion experiencing numbness/tingling bilat hands and LEs.  MRI of cervical spine done showing multilevel DJD with moderate canal stenosis C4-5, severe left C5-6 and severe right stenosis C6-7 with increase in cord signal at C4.  He is using sliding board for home transfers surface<>w/c.    The patient underwent IP rehab thru 01/13/18 and then d/c with home health until last week.      Pertinent History  history of CAD with chronic combined CHF, T2DM, morbid obesity, hypertension, PE in the past; venous stasis ulcers.    Patient Stated Goals  Returning to greatest level of independence.  (prior status was short distance walking with 2 canes).    Currently in Pain?  Yes    Pain Score  --   with movement   Pain Location  Shoulder    Pain Orientation  Right    Pain Descriptors / Indicators  Aching;Discomfort    Pain Type  Chronic pain    Pain Onset  More than a month ago    Pain Frequency  Intermittent    Aggravating Factors   lifting arm    Pain Relieving Factors  rest         [facility] PT Assessment - 05/26/18 1248      Assessment   Medical Diagnosis  C4 spinal cord injury/ tetraplegia    Referring Provider (PT)  Faith Rogue, MD    Onset Date/Surgical Date  12/13/17    Hand Dominance  Right    Prior Therapy  acute, inpatient rehab, and homehealth therapy      Precautions   Precautions  Fall      Restrictions   Weight Bearing Restrictions  No      Balance Screen   Has the patient fallen in the past 6 months  No    Has the patient had a decrease in  activity level because of a fear of falling?   Yes   due to weakness from C4 SCI   Is the patient reluctant to leave their home because of a fear of falling?   Yes   needs assist     Home Environment   Living Environment  Private residence    Living Arrangements  Spouse/significant other   sister-in law, great grandkids, help from daughter   Type of Home  House   split level   Home Access  Ramped entrance    Home Layout  Multi-level    Home Equipment  Wheelchair - manual;Walker - 2 wheels;Bedside commode;Shower seat    Additional Comments  Currently has 24 hour care.  He notes he is able to get out of the house by himself if needed.      Prior Function   Level of Independence  Independent with community mobility with device;Independent with household mobility with device    Vocation  Retired    Gaffer  retired Tour manager   Overall Cognitive Status  Within Functional Limits for tasks assessed      Sensation   Additional Comments  PT inquired about "polyneuropathy": noted in chart.  He reports that he does not have numbness/tingling or significant sensory changes.  Compression socks are helping edema in LEs.  Has tingling in bilateral forearm/wrist area.   Notes this sensation is improving.      Posture/Postural Control   Posture Comments  Obesity; patient appears to have good awareness of body movement and is able to direct therapist best way to help based on his mobility at home and with prior therapy.      ROM / Strength   AROM / PROM / Strength  AROM;Strength      AROM   Overall AROM Comments  Pain with R shoulder AROM; L knee significantly limited although measurements not taken due to time constraints at evaluation.      Strength   Overall Strength Comments  R hip flexion 4/5, R knee flexion/extension 4/5, R ankle DF 4/5.  L hip flexion 3/5, L knee extension *limited AROM with pain into extension, L ankle DF is 3/5.       Bed Mobility   Bed  Mobility  Supine to Sit;Sit to Supine    Supine to Sit  --   Patient reports family helps him into hospital bed   Sit to Supine  --   family assists at home into hospital bed     Transfers   Transfers  Sit to Stand;Stand to Sit    Sit to Stand  With upper extremity assist;Multiple attempts;5: Supervision    Sit to Stand Details (indicate cue type and reason)  Patient is unable to flex left knee to use for loading weight during transition sit>stand.  He places L leg in approximately 60 degrees knee flexion and flexes R knee into 90 degrees flexion.  He rocks to gain momentum to move sit>stand and positions RW anteriorly (past L foot) and reaches for walker once standing.  He maintains a flexed trunk position.  He asked PT not to provide manual assistance during transfer.    Stand to Sit  5: Supervision;With armrests   L leg anteriorly positioned   Comments  Uses sliding board at home.  Reports he performs squat pivot type transfer into car holding onto car surfaces.      Ambulation/Gait   Ambulation/Gait  Yes    Ambulation/Gait Assistance  4: Min guard    Ambulation Distance (Feet)  25 Feet    Assistive device  Rolling walker   barriatric   Gait Pattern  Decreased stance time - left;Decreased step length - right;Decreased step length - left;Poor foot clearance - left;Poor foot clearance - right;Wide base of support;Trunk flexed;Shuffle   has a slipper on left foot/tennis shoe on right   Ambulation Surface  Level    Stairs  No   Unable   Gait Comments  The patient positions hands anterior to hand grips on the barriatric RW.  This leads to tendency of walker to tip anteriorly.  PT provided verbal cues for hand grip, however patient more comfortable with chosen position.  Therefore, PT provided CGA at shoulder and occasionally needed to hold walker to stabilize.    Daughter followed with the wheelchair.                Objective measurements completed on examination: See above  findings.              PT Education - 05/26/18 1733    Education Details  goals of physical therapy    Person(s) Educated  Patient;Child(ren)    Methods  Explanation    Comprehension  Verbalized understanding       PT Short Term Goals - 05/26/18 1744      PT SHORT TERM GOAL #1   Title  The patient will return demo HEP emphasizing L knee flexion (chronic injury- may be able to get more flexion to use in functional transfers?)/extension, bilat LE strength, standing balance, and general conditioning. (STGs due 06/25/2018)    Time  4    Period  Weeks    Target Date  06/25/18      PT SHORT TERM GOAL #2   Title  The patient will move sit<>stand mod indep to barriatric RW.    Time  4    Period  Weeks    Target Date  06/25/18      PT SHORT TERM GOAL #3   Title  The patient will move sit<>supine mod indep to be able to get into/out of hospital bed in home.    Time  4    Period  Weeks    Target Date  06/25/18      PT SHORT TERM GOAL #4   Title  The patient will ambulate 100 ft with RW and close supervision to improve household ambulation.    Time  4    Period  Weeks    Target Date  06/25/18  PT SHORT TERM GOAL #5   Title  The patient will be further assessed for gait speed and stairs when able.    Time  4    Period  Weeks    Target Date  06/25/18        PT Long Term Goals - 05/26/18 1747      PT LONG TERM GOAL #1   Title  The patient will be indep with progression of HEP for post d/c strengthening and mobility. (LTG due date 08/24/2018)    Time  12    Period  Weeks    Target Date  08/24/18      PT LONG TERM GOAL #2   Title  The patient will be indep with bed mobility in standard bed (not relying on hospital bed).    Time  12    Period  Weeks    Target Date  08/24/18      PT LONG TERM GOAL #3   Title  The patient will ambulate x 400 ft with RW mod indep for household and short distance community mobility.    Time  12    Period  Weeks    Target Date   08/24/18      PT LONG TERM GOAL #4   Title  Further gait speed goal to follow.    Time  12    Period  Weeks    Target Date  08/24/18      PT LONG TERM GOAL #5   Title  Stair goal to follow, when appropriate    Time  12    Period  Weeks    Target Date  08/24/18             Plan - 05/26/18 1758    Clinical Impression Statement  The patient is a 67 year old male s/p fall April 2019 referred for C4 SCI/tetraplegia.  He presents with impairments in LE strength (L side weaker than R), decreased L knee AROM, R shoulder pain, UE sensory disturbance, pain leading to functional limitations of decreased bed mobility, inability to transfer independently, difficulty walking and decline in household/community ambulation.     History and Personal Factors relevant to plan of care:  C4 tetraplegia; history of CAD with chronic combined CHF, T2DM, morbid obesity, hypertension, PE in the past; venous stasis ulcers.    Clinical Presentation  Evolving    Clinical Presentation due to:  due to high fall risk, LE edema, h/o venous statis ulcers, inability to tolerate shoe on left foot.    Clinical Decision Making  Moderate    Rehab Potential  Good    Clinical Impairments Affecting Rehab Potential  obesity, prior functional status (walked short distances with 2 canes due to h/o L knee surgery and other impairments)    PT Frequency  2x / week   + evaluation   PT Duration  12 weeks    PT Treatment/Interventions  ADLs/Self Care Home Management;Balance training;Neuromuscular re-education;Patient/family education;Gait training;Stair training;Functional mobility training;DME Instruction;Therapeutic activities;Therapeutic exercise    PT Next Visit Plan  measure L knee ROM (is it a hard end feel-- can we get further ROM to get L foot under knee for sit>stand?), HEP: LE strengthening in sitting or in supine, sit<>stand if able.  Gait (attempt to get hands on walker hand grips as able), standing at counter for HEP?   work on hip extension/glut activation for more upright posture.    Recommended Other Services  OT evaluation *PT to request  order in epic    Consulted and Agree with Plan of Care  Patient;Family member/caregiver    Family Member Consulted  daughter present       Patient will benefit from skilled therapeutic intervention in order to improve the following deficits and impairments:  Abnormal gait, Impaired sensation, Obesity, Decreased activity tolerance, Decreased balance, Decreased strength, Pain, Decreased mobility, Difficulty walking, Decreased range of motion, Impaired flexibility, Postural dysfunction  Visit Diagnosis: Muscle weakness (generalized)  Other abnormalities of gait and mobility  Unsteadiness on feet     Problem List Patient Active Problem List   Diagnosis Date Noted  . Pain of left heel   . C4 spinal cord injury, sequela (HCC) 12/18/2017  . Tetraplegia (HCC) 12/18/2017  . Acute pulmonary embolism (HCC) 12/18/2017  . Aortic aneurysm (HCC) 12/16/2017  . Cord compression (HCC) 12/13/2017  . Chronic combined systolic and diastolic heart failure (HCC) 10/09/2016  . CAD S/P percutaneous coronary angioplasty 09/20/2016  . Erectile dysfunction 07/30/2015  . Morbid obesity with BMI of 50.0-59.9, adult (HCC) 07/18/2013  . History of pulmonary embolism 07/13/2013  . Angioedema of lips 07/13/2013  . Multinodular goiter 07/13/2013  . Dyslipidemia, goal LDL below 70 10/02/2011  . SOB (shortness of breath) 10/02/2011  . BPH with urinary obstruction 07/17/2010  . Diabetes mellitus type 2, controlled (HCC) 07/17/2010  . Bilateral lower extremity edema 12/08/2008  . NEUROPATHY, IDIOPATHIC PERIPHERAL NEC 05/31/2007  . Osteoarthritis 03/22/2007  . Gout 03/15/2007  . Essential hypertension 03/15/2007    Shetara Launer, PT 05/26/2018, 6:14 PM  [facility] [address] [place], [place], [address] Phone:  [phone]   Fax:  [phone]  Name: Franklin Hicks MRN: [id number] Date of Birth: Jan 15, 1951

## 2018-05-26 NOTE — Progress Notes (Signed)
PCP: Shelva Majestic, MD  Clinic Note: Chief Complaint  Patient presents with  . Follow-up    No major cardiac complaints, but had prolonged hospitalization  . Coronary Artery Disease    Also PE    HPI: Franklin Hicks is a 67 y.o. male with a PMH below who presents today for ~9 month follow up -- delayed due to prolonged hospitalization in April-May.  He previously been seen by Dr. Algie Coffer while in the hospital with non-STEMI. He had a DES placed to the RAMUS INTERMEDIUS. He requested change to [facility] at the request of Shelva Majestic, MD.  Since his MI, he has not had any further recurrent cardiac symptoms to speak of but does have multiple questions and concerns about his cardiac disease and how to best manage it. Questions about diet and exercise. Unfortunately his exercise is limited by the fact he has bone-on-bone pain is hoping for having left knee surgery.   Aizik Reh Quinones was last in December 2018.  Doing well from a cardiac standpoint with no active symptoms.  May be had some occasional left arm tingling.  More like a neurologic issue.  Left leg swelling worse than right.  Wearing support stockings.  On stable dose of Lasix 3 days a week.  Some exertional dyspnea from prior to his MI from deconditioning.  No angina symptoms.  Recent Hospitalizations:   Hospitalized April 21-25, followed by inpatient rehab until May 22: C spine compression with cord compression and neurologic sequela as complication of a fall -he fell in the bathtub, having slipped.  He fell forward and hit his head on the side of the tub.  No LOC, but had significant neck pain as a result of cervical spine compression with cord compression.  Hospitalization was complicated by the fact that he had an acute PE on April 23 --> therefore C-spine surgery was delayed. -> now on Xarelto  Cord compression managed with steroids    Studies Personally Reviewed - (if available, images/films reviewed: From Epic Chart  or Care Everywhere)  CT Angiogram of the Chest, PE Protocol (12/15/2017): Acute PE of right main pulmonary artery extending into multiple segments.  Stable 4 cm ascending aortic dilation.  Moderate coronary calcification.  Interval History: Franklin Hicks presents here today for follow-up -- is 6 months out from C-spine injury Larey Seat on Easter Sunday -- Central Cord Syndrome).  Completed PT /OT with HH - now doing Gym PT/OT 2 d/week. Unfortunately, clearly he has not been able to be all that active and exercising, but is doing physical therapy.  He does have right leg greater than left leg swelling has had a ulcer being treated by wound care.  He is now wearing compression stockings but seems to be helping.  Edema is still there but he is only still taking his Lasix 3 days a week. He has not had any symptoms of chest tightness or dyspnea with rest or the amount of exertion he does not with physical therapy.  He feels occasional palpitations off and on, but nothing like a prolonged irregular heartbeat/rhythm.  Clearly he has weakness from his injury, mostly relating to arm but also legs.  Still working with physical therapy.  Now has completed home health therapy and is now going to the gym for therapy.  He denies any PND orthopnea associated with edema. No syncope/near syncope or TIA/amaurosis fugax  He is now on Plavix and Xarelto with no bleeding issues.  Just notes easy bruising.  No melena, hematochezia, hematuria or epistaxis.  ROS: A comprehensive was performed. Review of Systems  Constitutional: Negative for malaise/fatigue and weight loss.  HENT: Negative for congestion.   Respiratory: Negative for sputum production, shortness of breath and wheezing.   Cardiovascular: Positive for leg swelling (R>L - using compression stockings).  Gastrointestinal: Negative for blood in stool and melena.  Genitourinary: Negative for flank pain and hematuria (intermittent with nephrolithiasis).  Musculoskeletal:  Positive for joint pain (Bone-on-bone knee pain) and neck pain.  Skin: Negative.   Neurological:       Still has limitations from C-spine injury -- now in a wheelchair - doing PT.    Endo/Heme/Allergies: Positive for environmental allergies. Bruises/bleeds easily.  Psychiatric/Behavioral: Negative for depression and memory loss. The patient is not nervous/anxious and does not have insomnia.        Seems to be handling his disability well with no obvious depressive response.  All other systems reviewed and are negative.   I have reviewed and (if needed) personally updated the patient's problem list, medications, allergies, past medical and surgical history, social and family history.   Past Medical History:  Diagnosis Date  . Arthritis    "knees" (12/17/2017)  . CAD S/P percutaneous coronary angioplasty 09/20/2016   Nstemi 08/2016. Dr. Algie Coffer. DES- brilinta and asa. Requests change to [facility] cardiology; 95% Ramus -> PCI Resolute Onyx DES 2.75 x 18  . Central cord syndrome (HCC) 12/17/2017  . Chronic combined systolic and diastolic heart failure (HCC) 10/09/2016   EF 45% and grade II diastolic after nstemi  . Diet-controlled diabetes mellitus (HCC)   . DJD (degenerative joint disease)   . Gout   . High cholesterol   . Hypertension   . NSTEMI (non-ST elevated myocardial infarction) (HCC) 09/20/2016   95% Ramus - > PCI   . Obesity   . Recurrent pulmonary embolism (HCC) 11/'14; 4/'19   a) Bilateral segmental and subsegmental pulmonary emboli with mild RV Strain.; b) after fall with C-spine Fxr --> Acute PE of right main pulmonary artery extending into multiple segments.    Past Surgical History:  Procedure Laterality Date  . CARDIAC CATHETERIZATION N/A 09/19/2016   Procedure: Left Heart Cath and Coronary Angiography;  Surgeon: Orpah Cobb, MD;  Location: [facility] INVASIVE CV LAB;  Service: Cardiovascular: 95% proximal Ramus Intermedius --> PCI  . CARDIAC CATHETERIZATION N/A 09/19/2016    Procedure: Coronary Stent Intervention;  Surgeon: Yvonne Kendall, MD;  Location: [facility] INVASIVE CV LAB;  Service: Cardiovascular: 95% ramus intermedius;  Resolute Onyx 2.75 x 18 mm drug-eluting stent  . CYSTOSCOPY/RETROGRADE/URETEROSCOPY/STONE EXTRACTION WITH BASKET  1610,9604   ureteral stone   . MENISCUS REPAIR Left    knee open meniscetomy  . TRANSTHORACIC ECHOCARDIOGRAM  2004   no lvh nl ejection fraction  . TRANSTHORACIC ECHOCARDIOGRAM  09/19/2016   In setting of NSTEMI:  EF 45-50% with diffuse hypokinesis. GR 2 DD. Mild biatrial enlargement.    Cardiac cath 09/19/2016: 95% ramus intermedius;  Resolute Onyx 2.75 x 18 mm drug-eluting stent        2-D Echo 09/19/2016: EF 45-50% with diffuse hypokinesis. GR 2 DD. Mild biatrial enlargement.   Current Meds  Medication Sig  . acetaminophen (TYLENOL) 325 MG tablet Take 1-2 tablets (325-650 mg total) by mouth every 4 (four) hours as needed for mild pain.  Marland Kitchen atorvastatin (LIPITOR) 80 MG tablet TAKE 1 TABLET (80 MG TOTAL) BY MOUTH DAILY AT 6 PM.  . diclofenac sodium (VOLTAREN) 1 % GEL Apply 1 application  topically 3 (three) times daily. Both knees  . docusate sodium (COLACE) 100 MG capsule Take 2 capsules (200 mg total) by mouth daily after supper.  . furosemide (LASIX) 20 MG tablet Take 1 tablet (20 mg total) by mouth every Monday, Wednesday, and Friday.  . metoprolol tartrate (LOPRESSOR) 50 MG tablet Take 1 tablet (50 mg total) by mouth 2 (two) times daily.  . Multiple Vitamin (MULTIVITAMIN WITH MINERALS) TABS tablet Take 1 tablet by mouth daily.  . Potassium Citrate 15 MEQ (1620 MG) TBCR Take 1 tablet by mouth 2 (two) times daily.  . rivaroxaban (XARELTO) 20 MG TABS tablet TAKE 1 TABLET (20 MG TOTAL) BY MOUTH DAILY WITH SUPPER.  Marland Kitchen terazosin (HYTRIN) 10 MG capsule Take 1 capsule (10 mg total) by mouth at bedtime. TAKE 1 CAPSULE (10 MG TOTAL) BY MOUTH AT BEDTIME.  Marland Kitchen terazosin (HYTRIN) 10 MG capsule TAKE 1 CAPSULE (10 MG TOTAL) BY MOUTH AT  BEDTIME.  . vitamin B-12 (CYANOCOBALAMIN) 100 MCG tablet Take 1 tablet (100 mcg total) by mouth daily.  . [DISCONTINUED] clopidogrel (PLAVIX) 75 MG tablet Take 1 tablet (75 mg total) by mouth daily.  . [DISCONTINUED] gabapentin (NEURONTIN) 100 MG capsule Take 100 mg bid and 200 mg at hs  - not on ASA  Allergies  Allergen Reactions  . Ace Inhibitors     Angioedema  . Other Hives and Itching    msg   Social History   Tobacco Use  . Smoking status: Former Smoker    Packs/day: 1.00    Years: 13.00    Pack years: 13.00    Types: Cigarettes    Last attempt to quit: 07/13/1977    Years since quitting: 40.9  . Smokeless tobacco: Never Used  Substance Use Topics  . Alcohol use: Yes    Alcohol/week: 0.0 standard drinks    Comment: holidays only  . Drug use: No   Social History   Social History Narrative   Married for 44 years. He has 3 children and 5 grandchildren. They also 5 great grandchildren. He lives with his wife. 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 -> [employer] education   Occasional beer or wine   No smoking history or illicit drug use.   One hour water aerobics sessions 3 days a week. Has not started back yet read from recent illness sounds.  . family history includes Liver disease in his mother.  Wt Readings from Last 3 Encounters:  05/19/18 (!) 360 lb (163.3 kg)  05/04/18 (!) 360 lb (163.3 kg)  04/12/18 (!) 360 lb (163.3 kg)     PHYSICAL EXAM BP (!) 130/57   Pulse 82   Ht 6' (1.829 m)   BMI 48.82 kg/m   Physical Exam  Constitutional: He is oriented to person, place, and time. He appears well-developed and well-nourished.  Well-groomed.  Super morbidly obese  HENT:  Head: Normocephalic and atraumatic.  Mouth/Throat: Oropharynx is clear and moist. No oropharyngeal exudate.  Neck: No hepatojugular reflux and no JVD (Unable to palpate) present. Carotid bruit is not present.  Cardiovascular: Normal rate, regular rhythm and intact distal  pulses. Exam reveals no gallop and no friction rub.  Murmur heard.  Crescendo-decrescendo early systolic murmur is present with a grade of 1/6 at the upper right sternal border and upper left sternal border. Does not radiate Very distant heart sounds.  Unable to palpate PMI  Pulmonary/Chest: Effort normal and breath sounds normal. No respiratory distress. He has no wheezes.  He has no rales.  Abdominal: Soft. Bowel sounds are normal. He exhibits no distension. There is no tenderness. There is no rebound.  Morbid obesity  Musculoskeletal: He exhibits edema (~2+ tense swelling LLE below knee,Tr-1+ RLE.  Support stockings in place) and deformity (Left knee appears to be more swollen than the rest of the leg.  Very stiff and painful).  Neurological: He is alert and oriented to person, place, and time.  Psychiatric: He has a normal mood and affect. His behavior is normal. Judgment and thought content normal.  Nursing note and vitals reviewed.   Adult ECG Report  Rate: 70 ;  Rhythm: normal sinus rhythm and Nonspecific ST and T-wave changes. Otherwise normal axis, intervals and durations;   Narrative Interpretation: Stable EKG   Other studies Reviewed: Additional studies/ records that were reviewed today include:  Recent Labs:   Lab Results  Component Value Date   CHOL 132 09/24/2017   HDL 43.30 09/24/2017   LDLCALC 73 09/24/2017   LDLDIRECT 72.0 10/09/2016   TRIG 78.0 09/24/2017   CHOLHDL 3 09/24/2017    ASSESSMENT / PLAN: Problem List Items Addressed This Visit    Aortic aneurysm (HCC) (Chronic)    Ascending aortic "aneurysm" of 4 cm.  Upper limit of normal would be 3.9 cm.  I do not know that this is true aneurysmal dilation.  We can monitor, but if stable I would not continue to follow.  Simply manage blood pressure.      Relevant Medications   aspirin EC 81 MG tablet   Bilateral lower extremity edema (Chronic)    Probably related to obesity hyperventilation syndrome and venous  insufficiency.  Not related to left heart failure.  He has no PND orthopnea. He had an Epworth score of about 10 when I saw him last year.  He may very well have sleep apnea, and we could consider sleep study.      CAD S/P percutaneous coronary angioplasty - Primary (Chronic)    He is now more than a year and half out from his non-STEMI with PCI on the right.  No anginal symptoms.  (Of note, Dr. Algie Coffer did diagnostic cath, but the PCI was performed by Dr. Okey Dupre) No further anginal symptoms.  He is on aspirin statin and Plavix along with beta-blocker.  With him being on Xarelto, I think we can stop Plavix and return to baby aspirin.  After January I think we can simply stop aspirin as well and continue Xarelto.      Relevant Medications   aspirin EC 81 MG tablet   Chronic combined systolic and diastolic heart failure (HCC) (Chronic)    Seems euvolemic on exam besides his lower extremity edema which is probably more related to venous stasis.  No PND or orthopnea.  Continue support stockings and 3 days a week Lasix.  Current blood pressure is stable on metoprolol.  Consider ARB for afterload reduction if blood pressure gets higher.         Relevant Medications   aspirin EC 81 MG tablet   Dyslipidemia, goal LDL below 70 (Chronic)    Remains on atorvastatin 80 mg daily.  LDL is very close to goal of 73.  Would continue current dose.  Target would be closer to 50, but would simply continue current meds.  Would like for the level to get down below 50, but 70 is initial target, and he is close to that. Continue current dose of atorvastatin.  If labs go up, may  need more aggressive management such as adding Zetia.      Relevant Medications   aspirin EC 81 MG tablet   Essential hypertension (Chronic)   Relevant Medications   aspirin EC 81 MG tablet   Recurrent pulmonary embolism (HCC) (Chronic)    He had a PE back in 2014, and now had a recurrent PE on this April.  I think is probably related  to him being sedentary after his fall, but cannot be sure.  He is now on essentially lifelong Xarelto. As a result, we will stop Plavix and switch to aspirin.  After 2 years out from his MI we will stop aspirin.  If there is a question of need first C-spine surgery, he is getting close to the time we could probably consider holding Xarelto (would be 72 hours pre-op)      Relevant Medications   aspirin EC 81 MG tablet     Routine clinic visit.  However chart prep included close to 40 minutes review of his prolonged hospitalization, study results, progress notes, H&P's/discharge summaries and consult notes.  Current medicines are reviewed at length with the patient today. (+/- concerns) n/a The following changes have been made: n/a  Patient Instructions  MEDICATION INSTRUCTIONS   ONCE  YOU COMPLETE PLAVIX ( CLOPIDOGREL) STOP MEDICATION AND START ASPIRIN 81 MG ONE TABLET A DAY.   CONTINUE TAKING XARELTO     Your physician wants you to follow-up in 12 MONTH WITH DR HARDING. You will receive a reminder letter in the mail two months in advance. If you don't receive a letter, please call our office to schedule the follow-up appointment.    If you need a refill on your cardiac medications before your next appointment, please call your pharmacy.    Studies Ordered:   No orders of the defined types were placed in this encounter.     Bryan Lemma, M.D., M.S. Interventional Cardiologist   Pager # [phone] Phone # [phone] [address] [place], [place] [address]

## 2018-05-27 ENCOUNTER — Other Ambulatory Visit: Payer: Self-pay | Admitting: *Deleted

## 2018-05-27 DIAGNOSIS — S14104S Unspecified injury at C4 level of cervical spinal cord, sequela: Secondary | ICD-10-CM

## 2018-05-27 MED ORDER — GABAPENTIN 100 MG PO CAPS: ORAL_CAPSULE | ORAL | 1 refills | Status: DC

## 2018-05-28 ENCOUNTER — Ambulatory Visit: Payer: Medicare Other | Admitting: Physical Therapy

## 2018-05-28 ENCOUNTER — Encounter: Payer: Self-pay | Admitting: Physical Therapy

## 2018-05-28 DIAGNOSIS — R2689 Other abnormalities of gait and mobility: Secondary | ICD-10-CM

## 2018-05-28 DIAGNOSIS — M6281 Muscle weakness (generalized): Secondary | ICD-10-CM

## 2018-05-28 NOTE — Therapy (Signed)
[facility] [address] [place], [place], [address] Phone: [phone]   Fax:  [phone]  Physical Therapy Treatment  Patient Details  Name: LARUE LIGHTNER MRN: [id number] Date of Birth: 1950/10/22 Referring Provider (PT): Faith Rogue, MD   Encounter Date: 05/28/2018  PT End of Session - 05/28/18 1151    Visit Number  2    Number of Visits  25   eval + 24 visits   Date for PT Re-Evaluation  08/24/18    Authorization Type  $20 copay [employer] medicare    PT Start Time  1151   pt needed to use restroom as brought back at 1146; educated if he needs restroom prior to session, front desk staff will allow him back with family to get to restroom   PT Stop Time  1239    PT Time Calculation (min)  48 min    Activity Tolerance  Patient tolerated treatment well    Behavior During Therapy  [facility] for tasks assessed/performed       Past Medical History:  Diagnosis Date  . Arthritis    "knees" (12/17/2017)  . CAD S/P percutaneous coronary angioplasty 09/20/2016   Nstemi 08/2016. Dr. Algie Coffer. DES- brilinta and asa. Requests change to [facility] cardiology; 95% Ramus -> PCI Resolute Onyx DES 2.75 x 18  . Central cord syndrome (HCC) 12/17/2017  . Chronic combined systolic and diastolic heart failure (HCC) 10/09/2016   EF 45% and grade II diastolic after nstemi  . Diet-controlled diabetes mellitus (HCC)   . DJD (degenerative joint disease)   . Gout   . High cholesterol   . Hypertension   . NSTEMI (non-ST elevated myocardial infarction) (HCC) 09/20/2016   95% Ramus - > PCI   . Obesity   . Pulmonary embolism (HCC) ~ 2015    Past Surgical History:  Procedure Laterality Date  . CARDIAC CATHETERIZATION N/A 09/19/2016   Procedure: Left Heart Cath and Coronary Angiography;  Surgeon: Orpah Cobb, MD;  Location: [facility] INVASIVE CV LAB;  Service: Cardiovascular: 95% proximal Ramus Intermedius --> PCI  . CARDIAC CATHETERIZATION N/A 09/19/2016   Procedure: Coronary Stent Intervention;  Surgeon: Yvonne Kendall, MD;  Location: [facility] INVASIVE CV LAB;  Service: Cardiovascular: 95% ramus intermedius;  Resolute Onyx 2.75 x 18 mm drug-eluting stent  . CYSTOSCOPY/RETROGRADE/URETEROSCOPY/STONE EXTRACTION WITH BASKET  2841,3244   ureteral stone   . MENISCUS REPAIR Left    knee open meniscetomy  . TRANSTHORACIC ECHOCARDIOGRAM  2004   no lvh nl ejection fraction  . TRANSTHORACIC ECHOCARDIOGRAM  09/19/2016   In setting of NSTEMI:  EF 45-50% with diffuse hypokinesis. GR 2 DD. Mild biatrial enlargement.    There were no vitals filed for this visit.  Subjective Assessment - 05/28/18 1943    Subjective  Reports he does some leg exercises from prior PT (demonstrates LAQ, seated marching, seated hip abduction with blue theraband). He does not do any standing exercises at home and has only done stand-pivot or walking with RW with HHPT. Reports he does not really try to elevate lower legs when OOB and does not achieve legs higher than heart in hospital bed.     Patient is accompained by:  Family member   daughter, Carollee Herter   Pertinent History  history of CAD with chronic combined CHF, T2DM, morbid obesity, hypertension, PE in the past; venous stasis ulcers.    Patient Stated Goals  Returning to greatest level of independence.  (prior status was short distance walking with 2  canes).    Currently in Pain?  Yes    Pain Location  Knee    Pain Orientation  Left;Proximal   just superior to left patella   Pain Descriptors / Indicators  Tender;Sore    Pain Type  Acute pain    Pain Onset  More than a month ago    Pain Frequency  Intermittent    Aggravating Factors   any knee movement    Pain Relieving Factors  rest         [facility] PT Assessment - 05/28/18 1949      ROM / Strength   AROM / PROM / Strength  AROM      AROM   Overall AROM   Deficits    AROM Assessment Site  Knee    Right/Left Knee  Right;Left    Right Knee Extension  -26   in 26 degrees  of flexion, measured in supine   Right Knee Flexion  80   in supine   Left Knee Extension  -30   in supine   Left Knee Flexion  60   in sitting; firm end feel with AAROM to ~65     Flexibility   Soft Tissue Assessment /Muscle Length  --      Palpation   Patella mobility  left patella limited glide superior/inferior                    [facility] Adult PT Treatment/Exercise - 05/28/18 0001      Bed Mobility   Bed Mobility  Supine to Sit;Sit to Supine    Supine to Sit  Minimal Assistance - Patient > 75%    Sit to Supine  Supervision/Verbal cueing   incr time/effort to get legs up onto mat table     Transfers   Transfers  Sit to Stand;Stand to Sit;Stand Pivot Transfers    Sit to Stand  4: Min guard;With upper extremity assist;With armrests;From bed;From chair/3-in-1    Sit to Stand Details (indicate cue type and reason)  Patient is unable to flex left knee to use for loading weight during transition sit>stand.  He places L leg in approximately 60 degrees knee flexion and flexes R knee into 90 degrees flexion.  He rocks to gain momentum to move sit>stand and positions RW anteriorly (past L foot) and reaches for walker once standing.  He maintains a flexed trunk position & at times rests on his forearms on RW.  He asked PT not to provide manual assistance during transfer.    Stand to Sit  5: Supervision;With armrests    Stand Pivot Transfers  4: Min guard   with bariatric RW; multiple small steps and incr time   Number of Reps  Other reps (comment)   2     Ambulation/Gait   Ambulation/Gait Assistance  4: Min guard    Ambulation Distance (Feet)  5 Feet    Assistive device  Rolling walker   bariatric   Gait Pattern  Decreased stance time - left;Decreased step length - right;Decreased step length - left;Poor foot clearance - left;Poor foot clearance - right;Wide base of support;Trunk flexed;Shuffle    Ambulation Surface  Indoor      Posture/Postural Control   Posture Comments   Obesity; patient appears to have good awareness of body movement and is able to direct therapist best way to help based on his mobility at home and with prior therapy.      Exercises   Exercises  Knee/Hip      Knee/Hip Exercises: Stretches   Hip Flexor Stretch  Both;1 rep   supine x 10 minutes   Hip Flexor Stretch Limitations  lying flat on mat with bolsters under knees      Knee/Hip Exercises: Seated   Long Arc Quad  AROM;Strengthening;Both;1 set;5 reps    Other Seated Knee/Hip Exercises  on hi/lo table, feet off the floor, knee extension/flexion for warm-up prior to moving to supine for exercise/stretching      Knee/Hip Exercises: Supine   Heel Slides  AROM;Strengthening;Both;2 sets;10 reps   to point of mild stretch for joint mobility more than streng   Straight Leg Raises Limitations  educated via handout & demonstration--did not perform             PT Education - 05/28/18 2012    Education Details  HEP    Person(s) Educated  Patient    Methods  Explanation;Demonstration;Verbal cues;Handout    Comprehension  Verbalized understanding;Returned demonstration;Verbal cues required;Need further instruction       PT Short Term Goals - 05/26/18 1744      PT SHORT TERM GOAL #1   Title  The patient will return demo HEP emphasizing L knee flexion (chronic injury- may be able to get more flexion to use in functional transfers?)/extension, bilat LE strength, standing balance, and general conditioning. (STGs due 06/25/2018)    Time  4    Period  Weeks    Target Date  06/25/18      PT SHORT TERM GOAL #2   Title  The patient will move sit<>stand mod indep to barriatric RW.    Time  4    Period  Weeks    Target Date  06/25/18      PT SHORT TERM GOAL #3   Title  The patient will move sit<>supine mod indep to be able to get into/out of hospital bed in home.    Time  4    Period  Weeks    Target Date  06/25/18      PT SHORT TERM GOAL #4   Title  The patient will ambulate 100 ft  with RW and close supervision to improve household ambulation.    Time  4    Period  Weeks    Target Date  06/25/18      PT SHORT TERM GOAL #5   Title  The patient will be further assessed for gait speed and stairs when able.    Time  4    Period  Weeks    Target Date  06/25/18        PT Long Term Goals - 05/26/18 1747      PT LONG TERM GOAL #1   Title  The patient will be indep with progression of HEP for post d/c strengthening and mobility. (LTG due date 08/24/2018)    Time  12    Period  Weeks    Target Date  08/24/18      PT LONG TERM GOAL #2   Title  The patient will be indep with bed mobility in standard bed (not relying on hospital bed).    Time  12    Period  Weeks    Target Date  08/24/18      PT LONG TERM GOAL #3   Title  The patient will ambulate x 400 ft with RW mod indep for household and short distance community mobility.    Time  12  Period  Weeks    Target Date  08/24/18      PT LONG TERM GOAL #4   Title  Further gait speed goal to follow.    Time  12    Period  Weeks    Target Date  08/24/18      PT LONG TERM GOAL #5   Title  Stair goal to follow, when appropriate    Time  12    Period  Weeks    Target Date  08/24/18            Plan - 05/28/18 2014    Clinical Impression Statement  Patient highly motivated and defers assist when offered ("I can do it"). Initiated HEP focusing on improving bil knee ROM and reducing arthritis pain to improve ease of transfers. Patient can continue to benefit from PT.     Rehab Potential  Good    Clinical Impairments Affecting Rehab Potential  obesity, prior functional status (walked short distances with 2 canes due to h/o L knee surgery and other impairments)    PT Frequency  2x / week   + evaluation   PT Duration  12 weeks    PT Treatment/Interventions  ADLs/Self Care Home Management;Balance training;Neuromuscular re-education;Patient/family education;Gait training;Stair training;Functional mobility  training;DME Instruction;Therapeutic activities;Therapeutic exercise    PT Next Visit Plan  check if Dr. Riley Kill has written OT referral; check if ?'s or problems with HEP intiated 10/04; assess standing at sink and add to HEP if approp; add to HEP as approp for LE strengthening, sit<>stand if able.  Gait (attempt to get hands on walker hand grips as able),  work on hip extension/glut activation for more upright posture.    Consulted and Agree with Plan of Care  Patient;Family member/caregiver    Family Member Consulted  daughter present       Patient will benefit from skilled therapeutic intervention in order to improve the following deficits and impairments:  Abnormal gait, Impaired sensation, Obesity, Decreased activity tolerance, Decreased balance, Decreased strength, Pain, Decreased mobility, Difficulty walking, Decreased range of motion, Impaired flexibility, Postural dysfunction  Visit Diagnosis: Muscle weakness (generalized)  Other abnormalities of gait and mobility     Problem List Patient Active Problem List   Diagnosis Date Noted  . Pain of left heel   . C4 spinal cord injury, sequela (HCC) 12/18/2017  . Tetraplegia (HCC) 12/18/2017  . Acute pulmonary embolism (HCC) 12/18/2017  . Aortic aneurysm (HCC) 12/16/2017  . Cord compression (HCC) 12/13/2017  . Chronic combined systolic and diastolic heart failure (HCC) 10/09/2016  . CAD S/P percutaneous coronary angioplasty 09/20/2016  . Erectile dysfunction 07/30/2015  . Morbid obesity with BMI of 50.0-59.9, adult (HCC) 07/18/2013  . History of pulmonary embolism 07/13/2013  . Angioedema of lips 07/13/2013  . Multinodular goiter 07/13/2013  . Dyslipidemia, goal LDL below 70 10/02/2011  . SOB (shortness of breath) 10/02/2011  . BPH with urinary obstruction 07/17/2010  . Diabetes mellitus type 2, controlled (HCC) 07/17/2010  . Bilateral lower extremity edema 12/08/2008  . NEUROPATHY, IDIOPATHIC PERIPHERAL NEC 05/31/2007  .  Osteoarthritis 03/22/2007  . Gout 03/15/2007  . Essential hypertension 03/15/2007    Zena Amos, PT 05/28/2018, 8:22 PM  [facility] [address] [place], [place], [address] Phone: [phone]   Fax:  [phone]  Name: DAVIE CLAUD MRN: [id number] Date of Birth: June 26, 1951

## 2018-05-28 NOTE — Patient Instructions (Signed)
Access Code: [email or web address]  URL: [email or web address]  Date: 05/28/2018  Prepared by: Veda Canning   Exercises  Supine Heel Slide - 10 reps - 2 sets - 1-2x daily - 7x weekly  Seated Knee Flexion Extension AROM - 10 reps - 2 sets - 1-2x daily - 7x weekly  Supine Straight Leg Raises - 10 reps - 2 sets - 1-2x daily - 7x weekly

## 2018-05-29 ENCOUNTER — Encounter: Payer: Self-pay | Admitting: Cardiology

## 2018-05-29 DIAGNOSIS — I2699 Other pulmonary embolism without acute cor pulmonale: Secondary | ICD-10-CM | POA: Insufficient documentation

## 2018-05-29 NOTE — Assessment & Plan Note (Signed)
He had a PE back in 2014, and now had a recurrent PE on this April.  I think is probably related to him being sedentary after his fall, but cannot be sure.  He is now on essentially lifelong Xarelto. As a result, we will stop Plavix and switch to aspirin.  After 2 years out from his MI we will stop aspirin.  If there is a question of need first C-spine surgery, he is getting close to the time we could probably consider holding Xarelto (would be 72 hours pre-op)

## 2018-05-29 NOTE — Assessment & Plan Note (Signed)
Remains on atorvastatin 80 mg daily.  LDL is very close to goal of 73.  Would continue current dose.  Target would be closer to 50, but would simply continue current meds.  Would like for the level to get down below 50, but 70 is initial target, and he is close to that. Continue current dose of atorvastatin.  If labs go up, may need more aggressive management such as adding Zetia.

## 2018-05-29 NOTE — Assessment & Plan Note (Signed)
Probably related to obesity hyperventilation syndrome and venous insufficiency.  Not related to left heart failure.  He has no PND orthopnea. He had an Epworth score of about 10 when I saw him last year.  He may very well have sleep apnea, and we could consider sleep study.

## 2018-05-29 NOTE — Assessment & Plan Note (Signed)
He is now more than a year and half out from his non-STEMI with PCI on the right.  No anginal symptoms.  (Of note, Dr. Algie Coffer did diagnostic cath, but the PCI was performed by Dr. Okey Dupre) No further anginal symptoms.  He is on aspirin statin and Plavix along with beta-blocker.  With him being on Xarelto, I think we can stop Plavix and return to baby aspirin.  After January I think we can simply stop aspirin as well and continue Xarelto.

## 2018-05-29 NOTE — Assessment & Plan Note (Signed)
Seems euvolemic on exam besides his lower extremity edema which is probably more related to venous stasis.  No PND or orthopnea.  Continue support stockings and 3 days a week Lasix.  Current blood pressure is stable on metoprolol.  Consider ARB for afterload reduction if blood pressure gets higher.

## 2018-05-29 NOTE — Assessment & Plan Note (Signed)
Ascending aortic "aneurysm" of 4 cm.  Upper limit of normal would be 3.9 cm.  I do not know that this is true aneurysmal dilation.  We can monitor, but if stable I would not continue to follow.  Simply manage blood pressure.

## 2018-05-31 ENCOUNTER — Telehealth: Payer: Self-pay | Admitting: Rehabilitative and Restorative Service Providers"

## 2018-05-31 DIAGNOSIS — S14104S Unspecified injury at C4 level of cervical spinal cord, sequela: Secondary | ICD-10-CM

## 2018-05-31 NOTE — Telephone Encounter (Signed)
Dr.Swartz, Vickki Muff was evaluated by PT on 05/26/2018.  The patient would benefit from OT evaluation for UE weakness and ADLs.   If you agree, please place an order in [facility] workque in [facility] or fax the order to [phone]. Thank you, Taveon Enyeart, PT

## 2018-05-31 NOTE — Telephone Encounter (Signed)
done

## 2018-06-02 ENCOUNTER — Ambulatory Visit: Payer: Medicare Other

## 2018-06-03 ENCOUNTER — Ambulatory Visit: Payer: Self-pay | Admitting: Physical Therapy

## 2018-06-04 ENCOUNTER — Other Ambulatory Visit: Payer: Self-pay | Admitting: Cardiology

## 2018-06-07 ENCOUNTER — Ambulatory Visit: Payer: Medicare Other | Admitting: Occupational Therapy

## 2018-06-07 ENCOUNTER — Other Ambulatory Visit: Payer: Self-pay

## 2018-06-07 ENCOUNTER — Encounter: Payer: Self-pay | Admitting: Occupational Therapy

## 2018-06-07 DIAGNOSIS — M25611 Stiffness of right shoulder, not elsewhere classified: Secondary | ICD-10-CM

## 2018-06-07 DIAGNOSIS — R208 Other disturbances of skin sensation: Secondary | ICD-10-CM

## 2018-06-07 DIAGNOSIS — M6281 Muscle weakness (generalized): Secondary | ICD-10-CM

## 2018-06-07 DIAGNOSIS — M25612 Stiffness of left shoulder, not elsewhere classified: Secondary | ICD-10-CM

## 2018-06-07 DIAGNOSIS — R2681 Unsteadiness on feet: Secondary | ICD-10-CM

## 2018-06-07 NOTE — Therapy (Signed)
[facility] [address] [place], [place], [address] Phone: [phone]   Fax:  [phone]  Occupational Therapy Evaluation  Patient Details  Name: Franklin Hicks MRN: [id number] Date of Birth: 1950/12/06 Referring Provider (OT): Jerrilyn Cairo Date: 06/07/2018  OT End of Session - 06/07/18 1332    Visit Number  1    Number of Visits  17    Authorization Type  Medicare    Authorization - Visit Number  1    Authorization - Number of Visits  10    OT Start Time  1147    OT Stop Time  1235    OT Time Calculation (min)  48 min    Behavior During Therapy  [facility] for tasks assessed/performed       Past Medical History:  Diagnosis Date  . Arthritis    "knees" (12/17/2017)  . CAD S/P percutaneous coronary angioplasty 09/20/2016   Nstemi 08/2016. Dr. Algie Coffer. DES- brilinta and asa. Requests change to [facility] cardiology; 95% Ramus -> PCI Resolute Onyx DES 2.75 x 18  . Central cord syndrome (HCC) 12/17/2017  . Chronic combined systolic and diastolic heart failure (HCC) 10/09/2016   EF 45% and grade II diastolic after nstemi  . Diet-controlled diabetes mellitus (HCC)   . DJD (degenerative joint disease)   . Gout   . High cholesterol   . Hypertension   . NSTEMI (non-ST elevated myocardial infarction) (HCC) 09/20/2016   95% Ramus - > PCI   . Obesity   . Recurrent pulmonary embolism (HCC) 11/'14; 4/'19   a) Bilateral segmental and subsegmental pulmonary emboli with mild RV Strain.; b) after fall with C-spine Fxr --> Acute PE of right main pulmonary artery extending into multiple segments.    Past Surgical History:  Procedure Laterality Date  . CARDIAC CATHETERIZATION N/A 09/19/2016   Procedure: Left Heart Cath and Coronary Angiography;  Surgeon: Orpah Cobb, MD;  Location: [facility] INVASIVE CV LAB;  Service: Cardiovascular: 95% proximal Ramus Intermedius --> PCI  . CARDIAC CATHETERIZATION N/A 09/19/2016   Procedure: Coronary Stent  Intervention;  Surgeon: Yvonne Kendall, MD;  Location: [facility] INVASIVE CV LAB;  Service: Cardiovascular: 95% ramus intermedius;  Resolute Onyx 2.75 x 18 mm drug-eluting stent  . CYSTOSCOPY/RETROGRADE/URETEROSCOPY/STONE EXTRACTION WITH BASKET  6962,9528   ureteral stone   . MENISCUS REPAIR Left    knee open meniscetomy  . TRANSTHORACIC ECHOCARDIOGRAM  2004   no lvh nl ejection fraction  . TRANSTHORACIC ECHOCARDIOGRAM  09/19/2016   In setting of NSTEMI:  EF 45-50% with diffuse hypokinesis. GR 2 DD. Mild biatrial enlargement.    There were no vitals filed for this visit.  Subjective Assessment - 06/07/18 1344    Currently in Pain?  No/denies    Pain Score  0-No pain        [facility] OT Assessment - 06/07/18 0001      Assessment   Medical Diagnosis  C4 Spinal Cord Injury / central cord / tetraplegia    Referring Provider (OT)  Riley Kill    Onset Date/Surgical Date  12/13/17    Hand Dominance  Right    Prior Therapy  acute, CIR, HH      Precautions   Precautions  Fall      Restrictions   Weight Bearing Restrictions  No      Balance Screen   Has the patient fallen in the past 6 months  No      Prior Function   Level of  Independence  Independent with basic ADLs;Independent with household mobility with device;Independent with homemaking with ambulation;Independent with community mobility with device    Vocation  Retired    Gaffer  retired Surveyor, quantity exercise, enjoys football / sports      ADL   Eating/Feeding  Independent    Grooming  Minimal assistance    Upper Body Bathing  Modified independent    Lower Body Bathing  Minimal assistance    Upper Body Dressing  Set up    Lower Body Dressing  Moderate assistance    Dealer - Architect  Minimal assistance    Toileting -  Hygiene  Moderate assistance    Web designer  Other (comment)    Tub/Shower  Transfer: Patient Percentage  0%    Web designer Method  Not assessed    Medical laboratory scientific officer    Transfers/Ambulation Related to ADL's  Patient using slide board and drop arm commode for increased independence with toileting    ADL comments  Patient has not showered nor slept in his own bed since returning from hospital      IADL   Prior Level of Function Shopping  modified independent - scooter    Shopping  Needs to be accompanied on any shopping trip    Prior Level of Function Light Housekeeping  vaccuuming,     Light Housekeeping  Does not participate in any housekeeping tasks    Prior Level of Function Meal Prep  some cooking - independent / grilling    Meal Prep  Needs to have meals prepared and served    Prior Level of Function Community Mobility  walked mod I with 2 canes    Community Mobility  Relies on family or friends for transportation    Prior Level of Function Medication Managment  independent    Medication Management  Is responsible for taking medication in correct dosages at correct time    Prior Level of Function Museum/gallery curator financial matters independently (budgets, writes checks, pays rent, bills goes to [employer]), collects and keeps track of income      Written Expression   Dominant Hand  Right      Vision - History   Baseline Vision  Wears glasses all the time    Additional Comments  Patient states needs vision check      Vision Assessment   Eye Alignment  Within Functional Limits      Activity Tolerance   Activity Tolerance  Endurance does not limit participation in activity      Cognition   Overall Cognitive Status  Within Functional Limits for tasks assessed      Sensation   Light Touch  Appears Intact    Stereognosis  Appears Intact    Hot/Cold  Not tested    Proprioception  Appears Intact    Additional Comments  reports tingling sensation in forearm/wrist       Coordination   Gross Motor Movements are Fluid and Coordinated  No    Fine Motor Movements are Fluid and Coordinated  No    Coordination and Movement Description  Patient with weak proximal UE, and h/o OA in B shoulders, compensates with breath holding and lat trunk flex    9 Hole Peg Test  Right;Left  Right 9 Hole Peg Test  36.43    Left 9 Hole Peg Test  36.69      ROM / Strength   AROM / PROM / Strength  AROM;Strength      AROM   Overall AROM   Deficits    AROM Assessment Site  Shoulder    Right/Left Shoulder  Right;Left    Right Shoulder Flexion  80 Degrees    Right Shoulder ABduction  85 Degrees    Left Shoulder Flexion  80 Degrees    Left Shoulder ABduction  90 Degrees      Strength   Overall Strength  Deficits    Overall Strength Comments  shoulder 3-/5, elbow - hand 5/5      Hand Function   Right Hand Gross Grasp  Impaired    Right Hand Grip (lbs)  54    Right Hand Lateral Pinch  12 lbs    Left Hand Gross Grasp  Impaired    Left Hand Grip (lbs)  49    Left Hand Lateral Pinch  12 lbs                      OT Education - 06/07/18 1332    Education Details  OT Plan of care and potential goals after discussion of eval findings    Person(s) Educated  Patient    Methods  Explanation    Comprehension  Verbalized understanding       OT Short Term Goals - 06/07/18 1347      OT SHORT TERM GOAL #1   Title  Patient will complete a home exercise program designed to improve AROM in Bilateral shoulders with min assist (due 07/07/18)    Time  4    Period  Weeks    Target Date  07/07/18      OT SHORT TERM GOAL #2   Title  Patient will complete a stand step transfer to commode with mod assist    Time  4    Period  Weeks    Status  New      OT SHORT TERM GOAL #3   Title  Patient will demonstrate static stand with min assist with upper extremities out of support for 1 minute    Time  4    Period  Weeks    Status  New      OT SHORT TERM GOAL #4   Title   Patient will transition from supine to sit with mod assist in preparation for return to regular (vs hospital) bed    Time  4    Period  Weeks    Status  New      OT SHORT TERM GOAL #5   Title  Patient will begin graduated driving program - first will attempt to operate car in empty lot with another adult driver.      Time  4    Period  Weeks    Status  New        OT Long Term Goals - 06/07/18 1359      OT LONG TERM GOAL #1   Title  Patient will complete an upgraded HEP designed to improve BUE strength and functional range of motion (due 08/06/18)    Time  8    Period  Weeks    Target Date  08/06/18      OT LONG TERM GOAL #2   Title  Patient will be modified independnet with toilet hygiene  Time  8    Period  Weeks    Status  New      OT LONG TERM GOAL #3   Title  Patient will complete shower using tub transfer bench and min assist    Time  8    Period  Weeks    Status  New      OT LONG TERM GOAL #4   Title  Patient will demonstrate adequate dynamic stand balance tolerance (5 min) to assist with simple cooking or grilling task    Time  8    Period  Weeks    Status  New      OT LONG TERM GOAL #5   Title  Patient will demonstrate ability to transition from sit to supine, supine to sit, and roll himself in regular bed in preparation for d/c HOSPITAL BED.      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  Patient's medical history is significant for obesity, CAD, CHF, HTN, DM II, pe, Venous stasis ulcers, OA, and incidental finding of AAA.  Patient is currently using wheelchair as primary source of mobility, and as such cannot access his bedroom and bathroom.  Patient was Independent with ADL/IADL prior to this fall, and currently requires assistance or modified technique to complete basic ADL at seated level.  Patient will benefit from skilled OT intervention to reduce caregiver burden, and to improve independence with functional mobility and ADL/IADL.      Occupational Profile and client history currently impacting functional performance  Husband, father, grandfather, retired Air traffic controller, enjoys watching sports, water exercise class    Occupational performance deficits (Please refer to evaluation for details):  ADL's;IADL's;Rest and Sleep;Leisure    Rehab Potential  Good    OT Frequency  2x / week    OT Duration  8 weeks    OT Treatment/Interventions  Self-care/ADL training;Aquatic Therapy;Therapeutic exercise;Functional Mobility Training;Balance training;Manual Therapy;Neuromuscular education;Therapeutic activities;Energy conservation;DME and/or AE instruction;Moist Heat;Cryotherapy;Patient/family education    Plan  Need to assess sit to stand and stand at counter ability.  Need to initiate HEP for shoulders (?able to do supine?) Patient really wants to address toilet hygiene - toilet transfer as stand step? simulated    Clinical Decision Making  Several treatment options, min-mod task modification necessary    Consulted and Agree with Plan of Care  Patient       Patient will benefit from skilled therapeutic intervention in order to improve the following deficits and impairments:  Decreased skin integrity, Decreased knowledge of precautions, Improper body mechanics, Decreased activity tolerance, Decreased knowledge of use of DME, Decreased strength, Impaired flexibility, Decreased  balance, Decreased mobility, Difficulty walking,  Impaired sensation, Obesity, Decreased range of motion, Increased edema, Pain, Decreased coordination, Impaired UE functional use  Visit Diagnosis: Muscle weakness (generalized) - Plan: Ot plan of care cert/re-cert  Stiffness of right shoulder, not elsewhere classified - Plan: Ot plan of care cert/re-cert  Stiffness of left shoulder, not elsewhere classified - Plan: Ot plan of care cert/re-cert  Other disturbances of skin sensation - Plan: Ot plan of care cert/re-cert  Unsteadiness on feet - Plan: Ot plan of care cert/re-cert    Problem List Patient Active Problem List   Diagnosis Date Noted  . Recurrent pulmonary embolism (HCC)   . Pain of left heel   . C4 spinal cord injury, sequela (HCC) 12/18/2017  . Tetraplegia (HCC) 12/18/2017  . Aortic aneurysm (HCC) 12/16/2017  . Cord compression (HCC) 12/13/2017  . Chronic combined systolic and diastolic heart failure (HCC) 10/09/2016  . CAD S/P percutaneous coronary angioplasty 09/20/2016  . Erectile dysfunction 07/30/2015  . Morbid obesity with BMI of 50.0-59.9, adult (HCC) 07/18/2013  . History of pulmonary embolism 07/13/2013  . Angioedema of lips 07/13/2013  . Multinodular goiter 07/13/2013  . Dyslipidemia, goal LDL below 70 10/02/2011  . SOB (shortness of breath) 10/02/2011  . BPH with urinary obstruction 07/17/2010  . Diabetes mellitus type 2, controlled (HCC) 07/17/2010  . Bilateral lower extremity edema 12/08/2008  . NEUROPATHY, IDIOPATHIC PERIPHERAL NEC 05/31/2007  . Osteoarthritis 03/22/2007  . Gout 03/15/2007  . Essential hypertension 03/15/2007    Collier Salina, OTR/L 06/07/2018, 2:11 PM  [facility] [address] [place], [place], [address] Phone: [phone]   Fax:  [phone]  Name: Franklin Hicks MRN: [id number] Date of Birth: 13-Sep-1950

## 2018-06-09 ENCOUNTER — Ambulatory Visit: Payer: Medicare Other | Admitting: Occupational Therapy

## 2018-06-09 ENCOUNTER — Ambulatory Visit: Payer: Medicare Other

## 2018-06-09 ENCOUNTER — Ambulatory Visit: Payer: Medicare Other | Admitting: Rehabilitative and Restorative Service Providers"

## 2018-06-15 ENCOUNTER — Ambulatory Visit: Payer: Medicare Other | Admitting: Occupational Therapy

## 2018-06-15 ENCOUNTER — Encounter: Payer: Self-pay | Admitting: Occupational Therapy

## 2018-06-15 DIAGNOSIS — M25612 Stiffness of left shoulder, not elsewhere classified: Secondary | ICD-10-CM

## 2018-06-15 DIAGNOSIS — M25611 Stiffness of right shoulder, not elsewhere classified: Secondary | ICD-10-CM

## 2018-06-15 DIAGNOSIS — M6281 Muscle weakness (generalized): Secondary | ICD-10-CM

## 2018-06-15 DIAGNOSIS — R208 Other disturbances of skin sensation: Secondary | ICD-10-CM

## 2018-06-15 DIAGNOSIS — R2681 Unsteadiness on feet: Secondary | ICD-10-CM

## 2018-06-15 NOTE — Therapy (Signed)
[facility] [address] [place], [place], [address] Phone: [phone]   Fax:  [phone]  Occupational Therapy Treatment  Patient Details  Name: Franklin Hicks MRN: [id number] Date of Birth: 02-01-1951 Referring Provider (OT): Jerrilyn Cairo Date: 06/15/2018  OT End of Session - 06/15/18 1641    Visit Number  2    Number of Visits  17    Authorization Type  Medicare    Authorization - Visit Number  2    Authorization - Number of Visits  10    OT Start Time  1533    OT Stop Time  1617    OT Time Calculation (min)  44 min    Activity Tolerance  Patient tolerated treatment well    Behavior During Therapy  [facility] for tasks assessed/performed       Past Medical History:  Diagnosis Date  . Arthritis    "knees" (12/17/2017)  . CAD S/P percutaneous coronary angioplasty 09/20/2016   Nstemi 08/2016. Dr. Algie Coffer. DES- brilinta and asa. Requests change to [facility] cardiology; 95% Ramus -> PCI Resolute Onyx DES 2.75 x 18  . Central cord syndrome (HCC) 12/17/2017  . Chronic combined systolic and diastolic heart failure (HCC) 10/09/2016   EF 45% and grade II diastolic after nstemi  . Diet-controlled diabetes mellitus (HCC)   . DJD (degenerative joint disease)   . Gout   . High cholesterol   . Hypertension   . NSTEMI (non-ST elevated myocardial infarction) (HCC) 09/20/2016   95% Ramus - > PCI   . Obesity   . Recurrent pulmonary embolism (HCC) 11/'14; 4/'19   a) Bilateral segmental and subsegmental pulmonary emboli with mild RV Strain.; b) after fall with C-spine Fxr --> Acute PE of right main pulmonary artery extending into multiple segments.    Past Surgical History:  Procedure Laterality Date  . CARDIAC CATHETERIZATION N/A 09/19/2016   Procedure: Left Heart Cath and Coronary Angiography;  Surgeon: Orpah Cobb, MD;  Location: [facility] INVASIVE CV LAB;  Service: Cardiovascular: 95% proximal Ramus Intermedius --> PCI  . CARDIAC  CATHETERIZATION N/A 09/19/2016   Procedure: Coronary Stent Intervention;  Surgeon: Yvonne Kendall, MD;  Location: [facility] INVASIVE CV LAB;  Service: Cardiovascular: 95% ramus intermedius;  Resolute Onyx 2.75 x 18 mm drug-eluting stent  . CYSTOSCOPY/RETROGRADE/URETEROSCOPY/STONE EXTRACTION WITH BASKET  6962,9528   ureteral stone   . MENISCUS REPAIR Left    knee open meniscetomy  . TRANSTHORACIC ECHOCARDIOGRAM  2004   no lvh nl ejection fraction  . TRANSTHORACIC ECHOCARDIOGRAM  09/19/2016   In setting of NSTEMI:  EF 45-50% with diffuse hypokinesis. GR 2 DD. Mild biatrial enlargement.    There were no vitals filed for this visit.  Subjective Assessment - 06/15/18 1542    Subjective   Pretty good    Currently in Pain?  No/denies    Pain Score  0-No pain                   OT Treatments/Exercises (OP) - 06/15/18 0001      ADLs   Toileting  Worked on skills related to toilet hygiene.  Patient cleaned himself from front in sustained squat position.  Simulated his ability to squat for 20 seconds with hands out of support.  Patient extremely motivated to be able to wipe himself.      Functional Mobility  Patient walked 25 feet with rolling walker and close supervision- with ultimate goal of being able to walk into  bathroom for toileting and showering.      ADL Comments  Reviewed OT short and long term goals and plan of care.  Patient reported that he was unable to get out of his house for therapy due to rainstorm.               OT Education - 06/15/18 1641    Education Details  OT goals    Person(s) Educated  Patient    Methods  Explanation    Comprehension  Verbalized understanding       OT Short Term Goals - 06/15/18 1646      OT SHORT TERM GOAL #1   Title  Patient will complete a home exercise program designed to improve AROM in Bilateral shoulders with min assist (due 07/07/18)    Status  On-going      OT SHORT TERM GOAL #2   Title  Patient will complete a stand  step transfer to commode with mod assist    Status  On-going      OT SHORT TERM GOAL #3   Title  Patient will demonstrate static stand with min assist with upper extremities out of support for 1 minute    Status  On-going      OT SHORT TERM GOAL #4   Title  Patient will transition from supine to sit with mod assist in preparation for return to regular (vs hospital) bed    Status  On-going      OT SHORT TERM GOAL #5   Title  Patient will begin graduated driving program - first will attempt to operate car in empty lot with another adult driver.      Status  On-going        OT Long Term Goals - 06/15/18 1646      OT LONG TERM GOAL #1   Title  Patient will complete an upgraded HEP designed to improve BUE strength and functional range of motion (due 08/06/18)    Status  On-going      OT LONG TERM GOAL #2   Title  Patient will be modified independnet with toilet hygiene    Status  On-going      OT LONG TERM GOAL #3   Title  Patient will complete shower using tub transfer bench and min assist    Status  On-going      OT LONG TERM GOAL #4   Title  Patient will demonstrate adequate dynamic stand balance tolerance (5 min) to assist with simple cooking or grilling task    Status  On-going      OT LONG TERM GOAL #5   Title  Patient will demonstrate ability to transition from sit to supine, supine to sit, and roll himself in regular bed in preparation for d/c HOSPITAL BED.      Status  On-going      OT LONG TERM GOAL #6   Title  Patient will return to driving    Status  On-going            Plan - 06/15/18 1641    Clinical Impression Statement  Patient is in agreement with OT goals and is eager for greater independence with ADL/IADL    Occupational Profile and client history currently impacting functional performance  Husband, father, grandfather, retired Air traffic controller, enjoys watching sports, water exercise class    Occupational performance deficits (Please refer to evaluation  for details):  ADL's;IADL's;Rest and Sleep;Leisure    Rehab Potential  Good  OT Frequency  2x / week    OT Duration  8 weeks    OT Treatment/Interventions  Self-care/ADL training;Aquatic Therapy;Therapeutic exercise;Functional Mobility Training;Balance training;Manual Therapy;Neuromuscular education;Therapeutic activities;Energy conservation;DME and/or AE instruction;Moist Heat;Cryotherapy;Patient/family education    Plan  Shoulder ROM HEP, Sit to supine / supine to sit in prep for d/c hospital bed    Clinical Decision Making  Several treatment options, min-mod task modification necessary    Consulted and Agree with Plan of Care  Patient       Patient will benefit from skilled therapeutic intervention in order to improve the following deficits and impairments:  Decreased skin integrity, Decreased knowledge of precautions, Improper body mechanics, Decreased activity tolerance, Decreased knowledge of use of DME, Decreased strength, Impaired flexibility, Decreased balance, Decreased mobility, Difficulty walking, Impaired sensation, Obesity, Decreased range of motion, Increased edema, Pain, Decreased coordination, Impaired UE functional use  Visit Diagnosis: Muscle weakness (generalized)  Stiffness of right shoulder, not elsewhere classified  Stiffness of left shoulder, not elsewhere classified  Other disturbances of skin sensation  Unsteadiness on feet    Problem List Patient Active Problem List   Diagnosis Date Noted  . Recurrent pulmonary embolism (HCC)   . Pain of left heel   . C4 spinal cord injury, sequela (HCC) 12/18/2017  . Tetraplegia (HCC) 12/18/2017  . Aortic aneurysm (HCC) 12/16/2017  . Cord compression (HCC) 12/13/2017  . Chronic combined systolic and diastolic heart failure (HCC) 10/09/2016  . CAD S/P percutaneous coronary angioplasty 09/20/2016  . Erectile dysfunction 07/30/2015  . Morbid obesity with BMI of 50.0-59.9, adult (HCC) 07/18/2013  . History of pulmonary  embolism 07/13/2013  . Angioedema of lips 07/13/2013  . Multinodular goiter 07/13/2013  . Dyslipidemia, goal LDL below 70 10/02/2011  . SOB (shortness of breath) 10/02/2011  . BPH with urinary obstruction 07/17/2010  . Diabetes mellitus type 2, controlled (HCC) 07/17/2010  . Bilateral lower extremity edema 12/08/2008  . NEUROPATHY, IDIOPATHIC PERIPHERAL NEC 05/31/2007  . Osteoarthritis 03/22/2007  . Gout 03/15/2007  . Essential hypertension 03/15/2007    Collier Salina, OTR/L 06/15/2018, 4:48 PM  [facility] [address] [place], [place], [address] Phone: [phone]   Fax:  [phone]  Name: MANOLO BOSKET MRN: [id number] Date of Birth: Sep 30, 1950

## 2018-06-16 ENCOUNTER — Ambulatory Visit: Payer: Medicare Other | Admitting: Rehabilitative and Restorative Service Providers"

## 2018-06-16 ENCOUNTER — Encounter: Payer: Self-pay | Admitting: Rehabilitative and Restorative Service Providers"

## 2018-06-16 DIAGNOSIS — M6281 Muscle weakness (generalized): Secondary | ICD-10-CM

## 2018-06-16 DIAGNOSIS — R2681 Unsteadiness on feet: Secondary | ICD-10-CM

## 2018-06-16 DIAGNOSIS — R2689 Other abnormalities of gait and mobility: Secondary | ICD-10-CM

## 2018-06-16 NOTE — Therapy (Signed)
[facility] [address] [place], [place], [address] Phone: [phone]   Fax:  [phone]  Physical Therapy Treatment  Patient Details  Name: Franklin Hicks MRN: [id number] Date of Birth: 1951/08/10 Referring Provider (PT): Faith Rogue, MD   Encounter Date: 06/16/2018  PT End of Session - 06/16/18 1149    Visit Number  3    Number of Visits  25   eval + 24 visits   Date for PT Re-Evaluation  08/24/18    Authorization Type  $20 copay [employer] medicare    PT Start Time  1148    PT Stop Time  1230    PT Time Calculation (min)  42 min    Activity Tolerance  Patient tolerated treatment well    Behavior During Therapy  [facility] for tasks assessed/performed       Past Medical History:  Diagnosis Date  . Arthritis    "knees" (12/17/2017)  . CAD S/P percutaneous coronary angioplasty 09/20/2016   Nstemi 08/2016. Dr. Algie Coffer. DES- brilinta and asa. Requests change to [facility] cardiology; 95% Ramus -> PCI Resolute Onyx DES 2.75 x 18  . Central cord syndrome (HCC) 12/17/2017  . Chronic combined systolic and diastolic heart failure (HCC) 10/09/2016   EF 45% and grade II diastolic after nstemi  . Diet-controlled diabetes mellitus (HCC)   . DJD (degenerative joint disease)   . Gout   . High cholesterol   . Hypertension   . NSTEMI (non-ST elevated myocardial infarction) (HCC) 09/20/2016   95% Ramus - > PCI   . Obesity   . Recurrent pulmonary embolism (HCC) 11/'14; 4/'19   a) Bilateral segmental and subsegmental pulmonary emboli with mild RV Strain.; b) after fall with C-spine Fxr --> Acute PE of right main pulmonary artery extending into multiple segments.    Past Surgical History:  Procedure Laterality Date  . CARDIAC CATHETERIZATION N/A 09/19/2016   Procedure: Left Heart Cath and Coronary Angiography;  Surgeon: Orpah Cobb, MD;  Location: [facility] INVASIVE CV LAB;  Service: Cardiovascular: 95% proximal Ramus Intermedius --> PCI  . CARDIAC  CATHETERIZATION N/A 09/19/2016   Procedure: Coronary Stent Intervention;  Surgeon: Yvonne Kendall, MD;  Location: [facility] INVASIVE CV LAB;  Service: Cardiovascular: 95% ramus intermedius;  Resolute Onyx 2.75 x 18 mm drug-eluting stent  . CYSTOSCOPY/RETROGRADE/URETEROSCOPY/STONE EXTRACTION WITH BASKET  6962,9528   ureteral stone   . MENISCUS REPAIR Left    knee open meniscetomy  . TRANSTHORACIC ECHOCARDIOGRAM  2004   no lvh nl ejection fraction  . TRANSTHORACIC ECHOCARDIOGRAM  09/19/2016   In setting of NSTEMI:  EF 45-50% with diffuse hypokinesis. GR 2 DD. Mild biatrial enlargement.    There were no vitals filed for this visit.  Subjective Assessment - 06/16/18 1950    Subjective  The patient reports he is sitting in the wheelchair at home because he cannot get up from a regular chair.      Patient Stated Goals  Returning to greatest level of independence.  (prior status was short distance walking with 2 canes).    Currently in Pain?  No/denies        THERAPEUTIC ACTIVITIES: Sit<>stand from w/c<>sink with supervision with patient needing excessive space between w/c and sink due to need to lean anteriorly (can't get feet underneath knees due to ROM deficits). Stood 41 seconds trial 1, and 3 minutes 30 seconds trial 2. Discussed sit>stand mechanics and demonstrated standing with ability to get one foot up under him for better leverage  to decrease rocking, then patient attempted scooting to edge of the chair and using R LE to transition to stand (ROM limitations prevent the left knee from flexing enough to get foot under knee).   Gait: Ambulation with bariatric RW x 32 feet, 40 feet with close supervision with patient requesting "don't hold me"  (PT had requested to use gait belt and patient refuses gait belt).  THERAPEUTIC EXERCISE: Seated knee flexion/extension with passive overpressure to increase flexion.              PT Education - 06/16/18 1952    Education Details  standing  at the sink x 3 minutes x 3 x/day, walking with family pushing w/c behind him length of the hallway.    Person(s) Educated  Patient    Methods  Explanation;Demonstration;Handout    Comprehension  Verbalized understanding;Returned demonstration       PT Short Term Goals - 05/26/18 1744      PT SHORT TERM GOAL #1   Title  The patient will return demo HEP emphasizing L knee flexion (chronic injury- may be able to get more flexion to use in functional transfers?)/extension, bilat LE strength, standing balance, and general conditioning. (STGs due 06/25/2018)    Time  4    Period  Weeks    Target Date  06/25/18      PT SHORT TERM GOAL #2   Title  The patient will move sit<>stand mod indep to barriatric RW.    Time  4    Period  Weeks    Target Date  06/25/18      PT SHORT TERM GOAL #3   Title  The patient will move sit<>supine mod indep to be able to get into/out of hospital bed in home.    Time  4    Period  Weeks    Target Date  06/25/18      PT SHORT TERM GOAL #4   Title  The patient will ambulate 100 ft with RW and close supervision to improve household ambulation.    Time  4    Period  Weeks    Target Date  06/25/18      PT SHORT TERM GOAL #5   Title  The patient will be further assessed for gait speed and stairs when able.    Time  4    Period  Weeks    Target Date  06/25/18        PT Long Term Goals - 05/26/18 1747      PT LONG TERM GOAL #1   Title  The patient will be indep with progression of HEP for post d/c strengthening and mobility. (LTG due date 08/24/2018)    Time  12    Period  Weeks    Target Date  08/24/18      PT LONG TERM GOAL #2   Title  The patient will be indep with bed mobility in standard bed (not relying on hospital bed).    Time  12    Period  Weeks    Target Date  08/24/18      PT LONG TERM GOAL #3   Title  The patient will ambulate x 400 ft with RW mod indep for household and short distance community mobility.    Time  12    Period   Weeks    Target Date  08/24/18      PT LONG TERM GOAL #4   Title  Further gait speed  goal to follow.    Time  12    Period  Weeks    Target Date  08/24/18      PT LONG TERM GOAL #5   Title  Stair goal to follow, when appropriate    Time  12    Period  Weeks    Target Date  08/24/18            Plan - 06/16/18 1959    Clinical Impression Statement  PT is encouraging further standing at home and walking with RW and family member pushing w/c behind him.   PT to progress to STGs  emphasizing greater mobility throughout the day.     PT Treatment/Interventions  ADLs/Self Care Home Management;Balance training;Neuromuscular re-education;Patient/family education;Gait training;Stair training;Functional mobility training;DME Instruction;Therapeutic activities;Therapeutic exercise    PT Next Visit Plan  add to HEP as approp for LE strengthening, sit<>stand if able.  Gait (attempt to get hands on walker hand grips as able),  work on hip extension/glut activation for more upright posture.    Consulted and Agree with Plan of Care  Patient       Patient will benefit from skilled therapeutic intervention in order to improve the following deficits and impairments:  Abnormal gait, Impaired sensation, Obesity, Decreased activity tolerance, Decreased balance, Decreased strength, Pain, Decreased mobility, Difficulty walking, Decreased range of motion, Impaired flexibility, Postural dysfunction  Visit Diagnosis: Muscle weakness (generalized)  Other abnormalities of gait and mobility  Unsteadiness on feet     Problem List Patient Active Problem List   Diagnosis Date Noted  . Recurrent pulmonary embolism (HCC)   . Pain of left heel   . C4 spinal cord injury, sequela (HCC) 12/18/2017  . Tetraplegia (HCC) 12/18/2017  . Aortic aneurysm (HCC) 12/16/2017  . Cord compression (HCC) 12/13/2017  . Chronic combined systolic and diastolic heart failure (HCC) 10/09/2016  . CAD S/P percutaneous coronary  angioplasty 09/20/2016  . Erectile dysfunction 07/30/2015  . Morbid obesity with BMI of 50.0-59.9, adult (HCC) 07/18/2013  . History of pulmonary embolism 07/13/2013  . Angioedema of lips 07/13/2013  . Multinodular goiter 07/13/2013  . Dyslipidemia, goal LDL below 70 10/02/2011  . SOB (shortness of breath) 10/02/2011  . BPH with urinary obstruction 07/17/2010  . Diabetes mellitus type 2, controlled (HCC) 07/17/2010  . Bilateral lower extremity edema 12/08/2008  . NEUROPATHY, IDIOPATHIC PERIPHERAL NEC 05/31/2007  . Osteoarthritis 03/22/2007  . Gout 03/15/2007  . Essential hypertension 03/15/2007    Franklin Hicks, PT 06/16/2018, 8:02 PM  [facility] [address] [place], [place], [address] Phone: [phone]   Fax:  [phone]  Name: Franklin Hicks MRN: [id number] Date of Birth: [birth year]

## 2018-06-16 NOTE — Patient Instructions (Signed)
STANDING AT HOME:  Move up to standing at the sink for 3 minutes, rest.    Do 3 reps, 2 times/day.  WALKING AT HOME:  Begin in the hallway with a family member pushing the w/c behind you.  Do 2-3 times/day.

## 2018-06-18 ENCOUNTER — Encounter: Payer: Self-pay | Admitting: Occupational Therapy

## 2018-06-18 ENCOUNTER — Ambulatory Visit: Payer: Medicare Other | Admitting: Occupational Therapy

## 2018-06-18 ENCOUNTER — Ambulatory Visit: Payer: Medicare Other | Admitting: Physical Therapy

## 2018-06-18 DIAGNOSIS — M25611 Stiffness of right shoulder, not elsewhere classified: Secondary | ICD-10-CM

## 2018-06-18 DIAGNOSIS — M6281 Muscle weakness (generalized): Secondary | ICD-10-CM

## 2018-06-18 DIAGNOSIS — M25612 Stiffness of left shoulder, not elsewhere classified: Secondary | ICD-10-CM

## 2018-06-18 DIAGNOSIS — R208 Other disturbances of skin sensation: Secondary | ICD-10-CM

## 2018-06-18 DIAGNOSIS — R2681 Unsteadiness on feet: Secondary | ICD-10-CM

## 2018-06-18 DIAGNOSIS — R2689 Other abnormalities of gait and mobility: Secondary | ICD-10-CM

## 2018-06-18 NOTE — Therapy (Signed)
[facility] [address] [place], [place], [address] Phone: [phone]   Fax:  [phone]  Occupational Therapy Treatment  Patient Details  Name: Franklin Hicks MRN: [id number] Date of Birth: 03-May-1951 Referring Provider (OT): Jerrilyn Cairo Date: 06/18/2018  OT End of Session - 06/18/18 1253    Visit Number  3    Number of Visits  17    Authorization Type  Medicare    Authorization - Visit Number  3    Authorization - Number of Visits  10    OT Start Time  1147    OT Stop Time  1230    OT Time Calculation (min)  43 min    Activity Tolerance  Patient tolerated treatment well    Behavior During Therapy  [facility] for tasks assessed/performed       Past Medical History:  Diagnosis Date  . Arthritis    "knees" (12/17/2017)  . CAD S/P percutaneous coronary angioplasty 09/20/2016   Nstemi 08/2016. Dr. Algie Coffer. DES- brilinta and asa. Requests change to [facility] cardiology; 95% Ramus -> PCI Resolute Onyx DES 2.75 x 18  . Central cord syndrome (HCC) 12/17/2017  . Chronic combined systolic and diastolic heart failure (HCC) 10/09/2016   EF 45% and grade II diastolic after nstemi  . Diet-controlled diabetes mellitus (HCC)   . DJD (degenerative joint disease)   . Gout   . High cholesterol   . Hypertension   . NSTEMI (non-ST elevated myocardial infarction) (HCC) 09/20/2016   95% Ramus - > PCI   . Obesity   . Recurrent pulmonary embolism (HCC) 11/'14; 4/'19   a) Bilateral segmental and subsegmental pulmonary emboli with mild RV Strain.; b) after fall with C-spine Fxr --> Acute PE of right main pulmonary artery extending into multiple segments.    Past Surgical History:  Procedure Laterality Date  . CARDIAC CATHETERIZATION N/A 09/19/2016   Procedure: Left Heart Cath and Coronary Angiography;  Surgeon: Orpah Cobb, MD;  Location: [facility] INVASIVE CV LAB;  Service: Cardiovascular: 95% proximal Ramus Intermedius --> PCI  . CARDIAC  CATHETERIZATION N/A 09/19/2016   Procedure: Coronary Stent Intervention;  Surgeon: Yvonne Kendall, MD;  Location: [facility] INVASIVE CV LAB;  Service: Cardiovascular: 95% ramus intermedius;  Resolute Onyx 2.75 x 18 mm drug-eluting stent  . CYSTOSCOPY/RETROGRADE/URETEROSCOPY/STONE EXTRACTION WITH BASKET  3762,8315   ureteral stone   . MENISCUS REPAIR Left    knee open meniscetomy  . TRANSTHORACIC ECHOCARDIOGRAM  2004   no lvh nl ejection fraction  . TRANSTHORACIC ECHOCARDIOGRAM  09/19/2016   In setting of NSTEMI:  EF 45-50% with diffuse hypokinesis. GR 2 DD. Mild biatrial enlargement.    There were no vitals filed for this visit.  Subjective Assessment - 06/18/18 1151    Subjective   I didn't get a chance to try yet.  (regarding toilet hygiene)    Currently in Pain?  No/denies    Pain Score  0-No pain                   OT Treatments/Exercises (OP) - 06/18/18 0001      Neurological Re-education Exercises   Other Exercises 1  Patient able to transition to supine without assistance.  In supine worked on shoulder range of motion.  Patient has over 120 degrees of shoulder flexion actively in left upper extremity without pain.  Functionally patient tends to use more glenohumeral abduction than flexion.  In Right upper extremity patient reports pain (occasional ache  lateral aspect of humerus)  with active motion greater than 110 degrees flexion.  Scapular mobilization first passive then active right shoulder.  Patient requires cueing for graded muscle control, balanced activation - anterior posterior musculature.      Other Exercises 2  Initiated potential HEP for active shoulder exercises in supine.  Patient able to transition from supine to sit without assistance.  Patient walked 30 feet toward exit at end of session, using bariatric RW, with verbal cueing for upright posture (vs forward posture) especially to clear right foot for swing.               OT Education - 06/18/18 1252     Education Details  Initiated potential HEP supine shoulder exercise (chest press, barrel roll, rainbow arc)  Need add'l practice as frequent cueing required for technique and to reduce compensations    Person(s) Educated  Patient    Methods  Explanation;Demonstration    Comprehension  Verbalized understanding;Need further instruction       OT Short Term Goals - 06/15/18 1646      OT SHORT TERM GOAL #1   Title  Patient will complete a home exercise program designed to improve AROM in Bilateral shoulders with min assist (due 07/07/18)    Status  On-going      OT SHORT TERM GOAL #2   Title  Patient will complete a stand step transfer to commode with mod assist    Status  On-going      OT SHORT TERM GOAL #3   Title  Patient will demonstrate static stand with min assist with upper extremities out of support for 1 minute    Status  On-going      OT SHORT TERM GOAL #4   Title  Patient will transition from supine to sit with mod assist in preparation for return to regular (vs hospital) bed    Status  On-going      OT SHORT TERM GOAL #5   Title  Patient will begin graduated driving program - first will attempt to operate car in empty lot with another adult driver.      Status  On-going        OT Long Term Goals - 06/15/18 1646      OT LONG TERM GOAL #1   Title  Patient will complete an upgraded HEP designed to improve BUE strength and functional range of motion (due 08/06/18)    Status  On-going      OT LONG TERM GOAL #2   Title  Patient will be modified independnet with toilet hygiene    Status  On-going      OT LONG TERM GOAL #3   Title  Patient will complete shower using tub transfer bench and min assist    Status  On-going      OT LONG TERM GOAL #4   Title  Patient will demonstrate adequate dynamic stand balance tolerance (5 min) to assist with simple cooking or grilling task    Status  On-going      OT LONG TERM GOAL #5   Title  Patient will demonstrate ability to  transition from sit to supine, supine to sit, and roll himself in regular bed in preparation for d/c HOSPITAL BED.      Status  On-going      OT LONG TERM GOAL #6   Title  Patient will return to driving    Status  On-going  Plan - 06/18/18 1255    Clinical Impression Statement  Patient eager for improved performance with self cares, and functional use of BUE's.  Patient is showing improved functional mobility.      Occupational Profile and client history currently impacting functional performance  Husband, father, grandfather, retired Air traffic controller, enjoys watching sports, water exercise class    Occupational performance deficits (Please refer to evaluation for details):  ADL's;IADL's;Rest and Sleep;Leisure    Rehab Potential  Good    OT Frequency  2x / week    OT Duration  8 weeks    OT Treatment/Interventions  Self-care/ADL training;Aquatic Therapy;Therapeutic exercise;Functional Mobility Training;Balance training;Manual Therapy;Neuromuscular education;Therapeutic activities;Energy conservation;DME and/or AE instruction;Moist Heat;Cryotherapy;Patient/family education    Plan  Shoulder ROM HEP - needs emphasis on ant deltoid - breathing, reduce neck activation;, Sit to supine / supine to sit in prep for d/c hospital bed, functional ambulation - opening doors, drawers, etc.      Clinical Decision Making  Several treatment options, min-mod task modification necessary    Consulted and Agree with Plan of Care  Patient       Patient will benefit from skilled therapeutic intervention in order to improve the following deficits and impairments:  Decreased skin integrity, Decreased knowledge of precautions, Improper body mechanics, Decreased activity tolerance, Decreased knowledge of use of DME, Decreased strength, Impaired flexibility, Decreased balance, Decreased mobility, Difficulty walking, Impaired sensation, Obesity, Decreased range of motion, Increased edema, Pain, Decreased  coordination, Impaired UE functional use  Visit Diagnosis: Muscle weakness (generalized)  Unsteadiness on feet  Stiffness of right shoulder, not elsewhere classified  Stiffness of left shoulder, not elsewhere classified  Other disturbances of skin sensation    Problem List Patient Active Problem List   Diagnosis Date Noted  . Recurrent pulmonary embolism (HCC)   . Pain of left heel   . C4 spinal cord injury, sequela (HCC) 12/18/2017  . Tetraplegia (HCC) 12/18/2017  . Aortic aneurysm (HCC) 12/16/2017  . Cord compression (HCC) 12/13/2017  . Chronic combined systolic and diastolic heart failure (HCC) 10/09/2016  . CAD S/P percutaneous coronary angioplasty 09/20/2016  . Erectile dysfunction 07/30/2015  . Morbid obesity with BMI of 50.0-59.9, adult (HCC) 07/18/2013  . History of pulmonary embolism 07/13/2013  . Angioedema of lips 07/13/2013  . Multinodular goiter 07/13/2013  . Dyslipidemia, goal LDL below 70 10/02/2011  . SOB (shortness of breath) 10/02/2011  . BPH with urinary obstruction 07/17/2010  . Diabetes mellitus type 2, controlled (HCC) 07/17/2010  . Bilateral lower extremity edema 12/08/2008  . NEUROPATHY, IDIOPATHIC PERIPHERAL NEC 05/31/2007  . Osteoarthritis 03/22/2007  . Gout 03/15/2007  . Essential hypertension 03/15/2007    Collier Salina, OTR/L 06/18/2018, 12:56 PM  [facility] [address] [place], [place], [address] Phone: [phone]   Fax:  [phone]  Name: Franklin Hicks MRN: [id number] Date of Birth: 19-Apr-1951

## 2018-06-18 NOTE — Therapy (Signed)
[facility] [address] [place], [place], [address] Phone: [phone]   Fax:  [phone]  Physical Therapy Treatment  Patient Details  Name: Franklin Hicks MRN: [id number] Date of Birth: 03-19-1951 Referring Provider (PT): Faith Rogue, MD   Encounter Date: 06/18/2018  PT End of Session - 06/18/18 1257    Visit Number  4    Number of Visits  25    Date for PT Re-Evaluation  08/24/18    Authorization Type  $20 copay [employer] medicare    PT Start Time  1100    PT Stop Time  1146    PT Time Calculation (min)  46 min    Activity Tolerance  Patient tolerated treatment well    Behavior During Therapy  [facility] for tasks assessed/performed       Past Medical History:  Diagnosis Date  . Arthritis    "knees" (12/17/2017)  . CAD S/P percutaneous coronary angioplasty 09/20/2016   Nstemi 08/2016. Dr. Algie Coffer. DES- brilinta and asa. Requests change to [facility] cardiology; 95% Ramus -> PCI Resolute Onyx DES 2.75 x 18  . Central cord syndrome (HCC) 12/17/2017  . Chronic combined systolic and diastolic heart failure (HCC) 10/09/2016   EF 45% and grade II diastolic after nstemi  . Diet-controlled diabetes mellitus (HCC)   . DJD (degenerative joint disease)   . Gout   . High cholesterol   . Hypertension   . NSTEMI (non-ST elevated myocardial infarction) (HCC) 09/20/2016   95% Ramus - > PCI   . Obesity   . Recurrent pulmonary embolism (HCC) 11/'14; 4/'19   a) Bilateral segmental and subsegmental pulmonary emboli with mild RV Strain.; b) after fall with C-spine Fxr --> Acute PE of right main pulmonary artery extending into multiple segments.    Past Surgical History:  Procedure Laterality Date  . CARDIAC CATHETERIZATION N/A 09/19/2016   Procedure: Left Heart Cath and Coronary Angiography;  Surgeon: Orpah Cobb, MD;  [facility] IN[facility] CV LAB;  Service: Cardiovascular: 95% proximal Ramus Intermedius --> PCI  . CARDIAC CATHETERIZATION N/A  09/19/2016   Procedure: Coronary Stent Intervention;  Surgeon: Yvonne Kendall, MD;  Location: [facility] INVASIVE CV LAB;  Service: Cardiovascular: 95% ramus intermedius;  Resolute Onyx 2.75 x 18 mm drug-eluting stent  . CYSTOSCOPY/RETROGRADE/URETEROSCOPY/STONE EXTRACTION WITH BASKET  6962,9528   ureteral stone   . MENISCUS REPAIR Left    knee open meniscetomy  . TRANSTHORACIC ECHOCARDIOGRAM  2004   no lvh nl ejection fraction  . TRANSTHORACIC ECHOCARDIOGRAM  09/19/2016   In setting of NSTEMI:  EF 45-50% with diffuse hypokinesis. GR 2 DD. Mild biatrial enlargement.    There were no vitals filed for this visit.  Subjective Assessment - 06/18/18 1246    Subjective  Pt reports he has worked some at home on getting his Rt knee flexed more so it can help him stand up from sitting.     Patient Stated Goals  Returning to greatest level of independence.  (prior status was short distance walking with 2 canes).    Currently in Pain?  No/denies                       [facility] Adult PT Treatment/Exercise - 06/18/18 0001      Bed Mobility   Bed Mobility  Supine to Sit;Sit to Supine    Supine to Sit  Independent   increased time needed   Sit to Supine  Independent   inc time  needed     Transfers   Transfers  Sit to Stand;Stand to Sit;Stand Pivot Transfers    Sit to Stand  5: Supervision;6: Modified independent (Device/Increase time)   with barriatric RW   Sit to Stand Details  Tactile cues for posture;Verbal cues for technique    Sit to Stand Details (indicate cue type and reason)  improved Rt foot positioning allow him to use this LE for improved performance with sit to stand and to not require as much momentum.    Stand to Sit  5: Supervision    Stand Pivot Transfers  5: Supervision;6: Modified independent (Device/Increase time)   with RW   Number of Reps  --   1 rep supine<sit>supine,  and chair<bed>chair, 4 X sit/stand     Ambulation/Gait   Ambulation/Gait  Yes    Ambulation/Gait  Assistance  5: Supervision    Ambulation Distance (Feet)  60 Feet    Assistive device  Rolling walker    Ambulation Surface  Indoor    Gait Comments  down 30 ft, turn around and back 30 feet, PT with close supervision but pt does not want to be touched.      Knee/Hip Exercises: Seated   Long Arc Quad  Both;Strengthening;2 sets;10 reps;Weights    Long Arc Quad Weight  2 lbs.    Other Seated Knee/Hip Exercises  standing tolerance at counter/sink 3 min X 2, cues for upright posture      Manual Therapy   Manual therapy comments  Lt knee PROM, patella mobs, jt mobs to inc ROM, his knee is very stiff any may not get much more ROM, better potential for ROM in Rt knee               PT Short Term Goals - 05/26/18 1744      PT SHORT TERM GOAL #1   Title  The patient will return demo HEP emphasizing L knee flexion (chronic injury- may be able to get more flexion to use in functional transfers?)/extension, bilat LE strength, standing balance, and general conditioning. (STGs due 06/25/2018)    Time  4    Period  Weeks    Target Date  06/25/18      PT SHORT TERM GOAL #2   Title  The patient will move sit<>stand mod indep to barriatric RW.    Time  4    Period  Weeks    Target Date  06/25/18      PT SHORT TERM GOAL #3   Title  The patient will move sit<>supine mod indep to be able to get into/out of hospital bed in home.    Time  4    Period  Weeks    Target Date  06/25/18      PT SHORT TERM GOAL #4   Title  The patient will ambulate 100 ft with RW and close supervision to improve household ambulation.    Time  4    Period  Weeks    Target Date  06/25/18      PT SHORT TERM GOAL #5   Title  The patient will be further assessed for gait speed and stairs when able.    Time  4    Period  Weeks    Target Date  06/25/18        PT Long Term Goals - 05/26/18 1747      PT LONG TERM GOAL #1   Title  The patient will be indep with  progression of HEP for post d/c strengthening and  mobility. (LTG due date 08/24/2018)    Time  12    Period  Weeks    Target Date  08/24/18      PT LONG TERM GOAL #2   Title  The patient will be indep with bed mobility in standard bed (not relying on hospital bed).    Time  12    Period  Weeks    Target Date  08/24/18      PT LONG TERM GOAL #3   Title  The patient will ambulate x 400 ft with RW mod indep for household and short distance community mobility.    Time  12    Period  Weeks    Target Date  08/24/18      PT LONG TERM GOAL #4   Title  Further gait speed goal to follow.    Time  12    Period  Weeks    Target Date  08/24/18      PT LONG TERM GOAL #5   Title  Stair goal to follow, when appropriate    Time  12    Period  Weeks    Target Date  08/24/18            Plan - 06/18/18 1257    Clinical Impression Statement  Pt showed good effort with PT today and was able to progress his ambulation tolerance and standing tolerance. He also showed improved performance with bed mobility and transfers and did not need assistance today but still requires close supervision. He also had improved technique with sit to stand today by flexing his Rt knee more so his foot is closer to him to allow for improved control and relying less on momentum to raise from chair. PT will continue to progress as able.    Clinical Impairments Affecting Rehab Potential  obesity, prior functional status (walked short distances with 2 canes due to h/o L knee surgery and other impairments)    PT Frequency  2x / week    PT Duration  12 weeks    PT Treatment/Interventions  ADLs/Self Care Home Management;Balance training;Neuromuscular re-education;Patient/family education;Gait training;Stair training;Functional mobility training;DME Instruction;Therapeutic activities;Therapeutic exercise    PT Next Visit Plan  add to HEP as approp for LE strengthening, sit<>stand if able.  Gait (attempt to get hands on walker hand grips as able),  work on hip extension/glut  activation for more upright posture.    Consulted and Agree with Plan of Care  Patient       Patient will benefit from skilled therapeutic intervention in order to improve the following deficits and impairments:  Abnormal gait, Impaired sensation, Obesity, Decreased activity tolerance, Decreased balance, Decreased strength, Pain, Decreased mobility, Difficulty walking, Decreased range of motion, Impaired flexibility, Postural dysfunction  Visit Diagnosis: Muscle weakness (generalized)  Other abnormalities of gait and mobility  Unsteadiness on feet     Problem List Patient Active Problem List   Diagnosis Date Noted  . Recurrent pulmonary embolism (HCC)   . Pain of left heel   . C4 spinal cord injury, sequela (HCC) 12/18/2017  . Tetraplegia (HCC) 12/18/2017  . Aortic aneurysm (HCC) 12/16/2017  . Cord compression (HCC) 12/13/2017  . Chronic combined systolic and diastolic heart failure (HCC) 10/09/2016  . CAD S/P percutaneous coronary angioplasty 09/20/2016  . Erectile dysfunction 07/30/2015  . Morbid obesity with BMI of 50.0-59.9, adult (HCC) 07/18/2013  . History of pulmonary embolism 07/13/2013  . Angioedema  of lips 07/13/2013  . Multinodular goiter 07/13/2013  . Dyslipidemia, goal LDL below 70 10/02/2011  . SOB (shortness of breath) 10/02/2011  . BPH with urinary obstruction 07/17/2010  . Diabetes mellitus type 2, controlled (HCC) 07/17/2010  . Bilateral lower extremity edema 12/08/2008  . NEUROPATHY, IDIOPATHIC PERIPHERAL NEC 05/31/2007  . Osteoarthritis 03/22/2007  . Gout 03/15/2007  . Essential hypertension 03/15/2007    April Manson, PT, DPT 06/18/2018, 1:02 PM  [facility] [address] [place], [place], [address] Phone: [phone]   Fax:  [phone]  Name: CULLEY HEDEEN MRN: [id number] Date of Birth: Feb 15, 1951

## 2018-06-21 ENCOUNTER — Other Ambulatory Visit: Payer: Self-pay | Admitting: Physical Medicine & Rehabilitation

## 2018-06-21 DIAGNOSIS — S14104S Unspecified injury at C4 level of cervical spinal cord, sequela: Secondary | ICD-10-CM

## 2018-06-23 ENCOUNTER — Ambulatory Visit: Payer: Medicare Other | Admitting: Rehabilitative and Restorative Service Providers"

## 2018-06-23 ENCOUNTER — Encounter: Payer: Self-pay | Admitting: Rehabilitative and Restorative Service Providers"

## 2018-06-23 DIAGNOSIS — M6281 Muscle weakness (generalized): Secondary | ICD-10-CM

## 2018-06-23 DIAGNOSIS — R2689 Other abnormalities of gait and mobility: Secondary | ICD-10-CM

## 2018-06-23 DIAGNOSIS — R2681 Unsteadiness on feet: Secondary | ICD-10-CM

## 2018-06-23 NOTE — Therapy (Signed)
[facility] [address] [place], [place], [address] Phone: [phone]   Fax:  [phone]  Physical Therapy Treatment  Patient Details  Name: Franklin Hicks MRN: [id number] Date of Birth: 1951/07/15 Referring Provider (PT): Faith Rogue, MD   Encounter Date: 06/23/2018  PT End of Session - 06/23/18 1547    Visit Number  5    Number of Visits  25    Date for PT Re-Evaluation  08/24/18    Authorization Type  $20 copay [employer] medicare    PT Start Time  1148    PT Stop Time  1230    PT Time Calculation (min)  42 min    Activity Tolerance  Patient tolerated treatment well    Behavior During Therapy  [facility] for tasks assessed/performed       Past Medical History:  Diagnosis Date  . Arthritis    "knees" (12/17/2017)  . CAD S/P percutaneous coronary angioplasty 09/20/2016   Nstemi 08/2016. Dr. Algie Coffer. DES- brilinta and asa. Requests change to [facility] cardiology; 95% Ramus -> PCI Resolute Onyx DES 2.75 x 18  . Central cord syndrome (HCC) 12/17/2017  . Chronic combined systolic and diastolic heart failure (HCC) 10/09/2016   EF 45% and grade II diastolic after nstemi  . Diet-controlled diabetes mellitus (HCC)   . DJD (degenerative joint disease)   . Gout   . High cholesterol   . Hypertension   . NSTEMI (non-ST elevated myocardial infarction) (HCC) 09/20/2016   95% Ramus - > PCI   . Obesity   . Recurrent pulmonary embolism (HCC) 11/'14; 4/'19   a) Bilateral segmental and subsegmental pulmonary emboli with mild RV Strain.; b) after fall with C-spine Fxr --> Acute PE of right main pulmonary artery extending into multiple segments.    Past Surgical History:  Procedure Laterality Date  . CARDIAC CATHETERIZATION N/A 09/19/2016   Procedure: Left Heart Cath and Coronary Angiography;  Surgeon: Orpah Cobb, MD;  Location: [facility] INVASIVE CV LAB;  Service: Cardiovascular: 95% proximal Ramus Intermedius --> PCI  . CARDIAC CATHETERIZATION N/A  09/19/2016   Procedure: Coronary Stent Intervention;  Surgeon: Yvonne Kendall, MD;  Location: [facility] INVASIVE CV LAB;  Service: Cardiovascular: 95% ramus intermedius;  Resolute Onyx 2.75 x 18 mm drug-eluting stent  . CYSTOSCOPY/RETROGRADE/URETEROSCOPY/STONE EXTRACTION WITH BASKET  6962,9528   ureteral stone   . MENISCUS REPAIR Left    knee open meniscetomy  . TRANSTHORACIC ECHOCARDIOGRAM  2004   no lvh nl ejection fraction  . TRANSTHORACIC ECHOCARDIOGRAM  09/19/2016   In setting of NSTEMI:  EF 45-50% with diffuse hypokinesis. GR 2 DD. Mild biatrial enlargement.    There were no vitals filed for this visit.  Subjective Assessment - 06/23/18 1155    Subjective  Patient is walking some at home.                       [facility] Adult PT Treatment/Exercise - 06/23/18 1548      Ambulation/Gait   Ambulation/Gait  Yes    Ambulation/Gait Assistance  5: Supervision    Ambulation/Gait Assistance Details  PT provided close supervision and had w/c behind patient during gait.    Ambulation Distance (Feet)  55 Feet   x 2, 30 x 2   Assistive device  Rolling walker    Gait Pattern  Decreased stance time - left;Decreased step length - right;Decreased step length - left;Poor foot clearance - left;Poor foot clearance - right;Wide base of support;Trunk flexed;Shuffle  Ambulation Surface  Level;Indoor      Exercises   Exercises  Other Exercises    Other Exercises   supine PROM into knee flexion, knee to chest with assist, quad set x 5 reps R and L sides x 5 second holds.                 PT Short Term Goals - 05/26/18 1744      PT SHORT TERM GOAL #1   Title  The patient will return demo HEP emphasizing L knee flexion (chronic injury- may be able to get more flexion to use in functional transfers?)/extension, bilat LE strength, standing balance, and general conditioning. (STGs due 06/25/2018)    Time  4    Period  Weeks    Target Date  06/25/18      PT SHORT TERM GOAL #2   Title   The patient will move sit<>stand mod indep to barriatric RW.    Time  4    Period  Weeks    Target Date  06/25/18      PT SHORT TERM GOAL #3   Title  The patient will move sit<>supine mod indep to be able to get into/out of hospital bed in home.    Time  4    Period  Weeks    Target Date  06/25/18      PT SHORT TERM GOAL #4   Title  The patient will ambulate 100 ft with RW and close supervision to improve household ambulation.    Time  4    Period  Weeks    Target Date  06/25/18      PT SHORT TERM GOAL #5   Title  The patient will be further assessed for gait speed and stairs when able.    Time  4    Period  Weeks    Target Date  06/25/18        PT Long Term Goals - 05/26/18 1747      PT LONG TERM GOAL #1   Title  The patient will be indep with progression of HEP for post d/c strengthening and mobility. (LTG due date 08/24/2018)    Time  12    Period  Weeks    Target Date  08/24/18      PT LONG TERM GOAL #2   Title  The patient will be indep with bed mobility in standard bed (not relying on hospital bed).    Time  12    Period  Weeks    Target Date  08/24/18      PT LONG TERM GOAL #3   Title  The patient will ambulate x 400 ft with RW mod indep for household and short distance community mobility.    Time  12    Period  Weeks    Target Date  08/24/18      PT LONG TERM GOAL #4   Title  Further gait speed goal to follow.    Time  12    Period  Weeks    Target Date  08/24/18      PT LONG TERM GOAL #5   Title  Stair goal to follow, when appropriate    Time  12    Period  Weeks    Target Date  08/24/18            Plan - 06/23/18 1554    Clinical Impression Statement  The patient has increased distance of ambulation today  and has started ambulating with w/c nearby in the home.  He has some tenderness over distal knee L LE with passive stretching.  Pain did not last after stretching ended.  Patient is also limited in biltaeral knee extension and PT provided  tactile cues during quad sets for muscle activation.  Continue working to [employer].      PT Treatment/Interventions  ADLs/Self Care Home Management;Balance training;Neuromuscular re-education;Patient/family education;Gait training;Stair training;Functional mobility training;DME Instruction;Therapeutic activities;Therapeutic exercise    PT Next Visit Plan  CHECK SHORT TERM GOALS.  add to HEP as approp for LE strengthening, sit<>stand if able.  Gait (attempt to get hands on walker hand grips as able),  work on hip extension/glut activation for more upright posture.    Consulted and Agree with Plan of Care  Patient       Patient will benefit from skilled therapeutic intervention in order to improve the following deficits and impairments:  Abnormal gait, Impaired sensation, Obesity, Decreased activity tolerance, Decreased balance, Decreased strength, Pain, Decreased mobility, Difficulty walking, Decreased range of motion, Impaired flexibility, Postural dysfunction  Visit Diagnosis: Muscle weakness (generalized)  Unsteadiness on feet  Other abnormalities of gait and mobility     Problem List Patient Active Problem List   Diagnosis Date Noted  . Recurrent pulmonary embolism (HCC)   . Pain of left heel   . C4 spinal cord injury, sequela (HCC) 12/18/2017  . Tetraplegia (HCC) 12/18/2017  . Aortic aneurysm (HCC) 12/16/2017  . Cord compression (HCC) 12/13/2017  . Chronic combined systolic and diastolic heart failure (HCC) 10/09/2016  . CAD S/P percutaneous coronary angioplasty 09/20/2016  . Erectile dysfunction 07/30/2015  . Morbid obesity with BMI of 50.0-59.9, adult (HCC) 07/18/2013  . History of pulmonary embolism 07/13/2013  . Angioedema of lips 07/13/2013  . Multinodular goiter 07/13/2013  . Dyslipidemia, goal LDL below 70 10/02/2011  . SOB (shortness of breath) 10/02/2011  . BPH with urinary obstruction 07/17/2010  . Diabetes mellitus type 2, controlled (HCC) 07/17/2010  .  Bilateral lower extremity edema 12/08/2008  . NEUROPATHY, IDIOPATHIC PERIPHERAL NEC 05/31/2007  . Osteoarthritis 03/22/2007  . Gout 03/15/2007  . Essential hypertension 03/15/2007    Jeffory Snelgrove, PT 06/23/2018, 4:50 PM  [facility] [address] [place], [place], [address] Phone: [phone]   Fax:  [phone]  Name: Franklin Hicks MRN: [id number] Date of Birth: 02/08/1951

## 2018-06-24 ENCOUNTER — Ambulatory Visit: Payer: Medicare Other | Admitting: Occupational Therapy

## 2018-06-24 ENCOUNTER — Ambulatory Visit: Payer: Medicare Other | Admitting: Rehabilitative and Restorative Service Providers"

## 2018-06-24 ENCOUNTER — Encounter: Payer: Self-pay | Admitting: Occupational Therapy

## 2018-06-24 DIAGNOSIS — R2681 Unsteadiness on feet: Secondary | ICD-10-CM

## 2018-06-24 DIAGNOSIS — M25612 Stiffness of left shoulder, not elsewhere classified: Secondary | ICD-10-CM

## 2018-06-24 DIAGNOSIS — M6281 Muscle weakness (generalized): Secondary | ICD-10-CM | POA: Diagnosis not present

## 2018-06-24 DIAGNOSIS — R208 Other disturbances of skin sensation: Secondary | ICD-10-CM

## 2018-06-24 DIAGNOSIS — M25611 Stiffness of right shoulder, not elsewhere classified: Secondary | ICD-10-CM

## 2018-06-24 DIAGNOSIS — R2689 Other abnormalities of gait and mobility: Secondary | ICD-10-CM

## 2018-06-24 NOTE — Therapy (Signed)
[facility] [address] [place], [place], [address] Phone: [phone]   Fax:  [phone]  Physical Therapy Treatment  Patient Details  Name: Franklin Hicks MRN: [id number] Date of Birth: 09-Jan-1951 Referring Provider (PT): Alger Simons, MD   Encounter Date: 06/24/2018  PT End of Session - 06/24/18 2042    Visit Number  6    Number of Visits  25    Date for PT Re-Evaluation  08/24/18    Authorization Type  $20 copay [employer] medicare    PT Start Time  1105    PT Stop Time  1150    PT Time Calculation (min)  45 min    Activity Tolerance  Patient tolerated treatment well    Behavior During Therapy  [facility] for tasks assessed/performed       Past Medical History:  Diagnosis Date  . Arthritis    "knees" (12/17/2017)  . CAD S/P percutaneous coronary angioplasty 09/20/2016   Nstemi 08/2016. Dr. Doylene Canard. DES- brilinta and asa. Requests change to [facility] cardiology; 95% Ramus -> PCI Resolute Onyx DES 2.75 x 18  . Central cord syndrome ([place]) 12/17/2017  . Chronic combined systolic and diastolic heart failure ([place]) 10/09/2016   EF 45% and grade II diastolic after nstemi  . Diet-controlled diabetes mellitus ([place])   . DJD (degenerative joint disease)   . Gout   . High cholesterol   . Hypertension   . NSTEMI (non-ST elevated myocardial infarction) ([place]) 09/20/2016   95% Ramus - > PCI   . Obesity   . Recurrent pulmonary embolism ([place]) 11/'14; 4/'19   a) Bilateral segmental and subsegmental pulmonary emboli with mild RV Strain.; b) after fall with C-spine Fxr --> Acute PE of right main pulmonary artery extending into multiple segments.    Past Surgical History:  Procedure Laterality Date  . CARDIAC CATHETERIZATION N/A 09/19/2016   Procedure: Left Heart Cath and Coronary Angiography;  Surgeon: Dixie Dials, MD;  Location: [facility];  Service: Cardiovascular: 95% proximal Ramus Intermedius --> PCI  . CARDIAC CATHETERIZATION N/A  09/19/2016   Procedure: Coronary Stent Intervention;  Surgeon: Nelva Bush, MD;  Location: [facility];  Service: Cardiovascular: 95% ramus intermedius;  Resolute Onyx 2.75 x 18 mm drug-eluting stent  . CYSTOSCOPY/RETROGRADE/URETEROSCOPY/STONE EXTRACTION WITH BASKET  6237,6283   ureteral stone   . MENISCUS REPAIR Left    knee open meniscetomy  . TRANSTHORACIC ECHOCARDIOGRAM  2004   no lvh nl ejection fraction  . TRANSTHORACIC ECHOCARDIOGRAM  09/19/2016   In setting of NSTEMI:  EF 45-50% with diffuse hypokinesis. GR 2 DD. Mild biatrial enlargement.    There were no vitals filed for this visit.  Subjective Assessment - 06/24/18 1117    Subjective  The patient has increased stiffness and soreness in knees after therapy yesterday and due to rainy weather.    Pertinent History  history of CAD with chronic combined CHF, T2DM, morbid obesity, hypertension, PE in the past; venous stasis ulcers.    Patient Stated Goals  Returning to greatest level of independence.  (prior status was short distance walking with 2 canes).    Currently in Pain?  Yes   stiffness/soreness in knees; chronic in nature; PT to monitor        [facility] PT Assessment - 06/24/18 1117      Ambulation/Gait   Ambulation/Gait Assistance Details  PT provided close supervision, verbal cues for postural upright, and brought w/c behind patient.    Ambulation  Distance (Feet)  120 Feet   with one rest break   Assistive device  Rolling walker    Gait Pattern  Decreased stance time - left;Decreased step length - right;Decreased step length - left;Poor foot clearance - left;Poor foot clearance - right;Wide base of support;Trunk flexed;Shuffle    Ambulation Surface  Level;Indoor    Gait velocity  0.37 ft/sec                   [facility] Adult PT Treatment/Exercise - 06/24/18 1117      Bed Mobility   Bed Mobility  Supine to Sit;Sit to Supine    Supine to Sit  Independent   requires increased time   Sit to Supine   Independent   requires increased time     Transfers   Transfers  Sit to Stand;Stand to Sit    Sit to Stand  5: Supervision    Stand to Sit  5: Supervision      Ambulation/Gait   Ambulation/Gait  Yes    Ambulation/Gait Assistance  5: Supervision      Exercises   Exercises  Other Exercises    Other Exercises   seated knee flexion using sliding board and pilow case x 10 reps each side. SUPINE:  bridges attempted with bilat feet on physioball; performed 10 isometric holds.  Supine <>R sidelying for core contraction.  Supine rhythmic stabilization for core contraction. Sci fit from w/c x 5 minutes without UE support (with wooden wedges to prevent posterior tipping in w/c).               PT Short Term Goals - 06/24/18 2103      PT SHORT TERM GOAL #1   Title  The patient will return demo HEP emphasizing L knee flexion (chronic injury- may be able to get more flexion to use in functional transfers?)/extension, bilat LE strength, standing balance, and general conditioning. (STGs due 06/25/2018)    Time  4    Period  Weeks      PT SHORT TERM GOAL #2   Title  The patient will move sit<>stand mod indep to barriatric RW.    Time  4    Period  Weeks      PT SHORT TERM GOAL #3   Title  The patient will move sit<>supine mod indep to be able to get into/out of hospital bed in home.    Time  4    Period  Weeks      PT SHORT TERM GOAL #4   Title  The patient will ambulate 100 ft with RW and close supervision to improve household ambulation.    Baseline  Patient ambulates 115 ft with RW, supervision and one rest break.    Time  4    Period  Weeks    Status  Partially Met      PT SHORT TERM GOAL #5   Title  The patient will be further assessed for gait speed and stairs when able.    Baseline  Gait speed=0.37 ft/sec with RW.  Stairs not safe at this time.    Time  4    Period  Weeks    Status  Partially Met        PT Long Term Goals - 05/26/18 1747      PT LONG TERM GOAL #1    Title  The patient will be indep with progression of HEP for post d/c strengthening and mobility. (LTG due date 08/24/2018)  Time  12    Period  Weeks    Target Date  08/24/18      PT LONG TERM GOAL #2   Title  The patient will be indep with bed mobility in standard bed (not relying on hospital bed).    Time  12    Period  Weeks    Target Date  08/24/18      PT LONG TERM GOAL #3   Title  The patient will ambulate x 400 ft with RW mod indep for household and short distance community mobility.    Time  12    Period  Weeks    Target Date  08/24/18      PT LONG TERM GOAL #4   Title  Further gait speed goal to follow.    Time  12    Period  Weeks    Target Date  08/24/18      PT LONG TERM GOAL #5   Title  Stair goal to follow, when appropriate    Time  12    Period  Weeks    Target Date  08/24/18            Plan - 06/24/18 2105    Clinical Impression Statement  The patient has partially met 2 STGs improving gait distance and assessing gait speed.  PT to assess STGs next session and continue working to [place].     PT Treatment/Interventions  ADLs/Self Care Home Management;Balance training;Neuromuscular re-education;Patient/family education;Gait training;Stair training;Functional mobility training;DME Instruction;Therapeutic activities;Therapeutic exercise    PT Next Visit Plan  CHECK SHORT TERM GOALS.  add to HEP as approp for LE strengthening, sit<>stand if able.  Gait (attempt to get hands on walker hand grips as able),  work on hip extension/glut activation for more upright posture.    Consulted and Agree with Plan of Care  Patient       Patient will benefit from skilled therapeutic intervention in order to improve the following deficits and impairments:  Abnormal gait, Impaired sensation, Obesity, Decreased activity tolerance, Decreased balance, Decreased strength, Pain, Decreased mobility, Difficulty walking, Decreased range of motion, Impaired flexibility, Postural  dysfunction  Visit Diagnosis: Muscle weakness (generalized)  Unsteadiness on feet  Other abnormalities of gait and mobility     Problem List Patient Active Problem List   Diagnosis Date Noted  . Recurrent pulmonary embolism ([place])   . Pain of left heel   . C4 spinal cord injury, sequela ([place]) 12/18/2017  . Tetraplegia ([place]) 12/18/2017  . Aortic aneurysm ([place]) 12/16/2017  . Cord compression ([place]) 12/13/2017  . Chronic combined systolic and diastolic heart failure ([place]) 10/09/2016  . CAD S/P percutaneous coronary angioplasty 09/20/2016  . Erectile dysfunction 07/30/2015  . Morbid obesity with BMI of 50.0-59.9, adult ([place]) 07/18/2013  . History of pulmonary embolism 07/13/2013  . Angioedema of lips 07/13/2013  . Multinodular goiter 07/13/2013  . Dyslipidemia, goal LDL below 70 10/02/2011  . SOB (shortness of breath) 10/02/2011  . BPH with urinary obstruction 07/17/2010  . Diabetes mellitus type 2, controlled ([place]) 07/17/2010  . Bilateral lower extremity edema 12/08/2008  . NEUROPATHY, IDIOPATHIC PERIPHERAL NEC 05/31/2007  . Osteoarthritis 03/22/2007  . Gout 03/15/2007  . Essential hypertension 03/15/2007    Naraly Fritcher, PT 06/24/2018, 9:14 PM  [facility] [address] [place], [place], [address] Phone: [phone]   Fax:  [phone]  Name: LAKE CINQUEMANI MRN: [id number] Date of Birth: Feb 03, 1951

## 2018-06-24 NOTE — Therapy (Signed)
[facility] [address] [place], [place], [address] Phone: [phone]   Fax:  [phone]  Occupational Therapy Treatment  Patient Details  Name: Franklin Hicks MRN: [id number] Date of Birth: Jan 25, 1951 Referring Provider (OT): Jerrilyn Cairo Date: 06/24/2018  OT End of Session - 06/24/18 1214    Visit Number  4    Number of Visits  17    Date for OT Re-Evaluation  08/06/18    Authorization Type  Medicare    Authorization - Visit Number  4    Authorization - Number of Visits  10    OT Start Time  1017    OT Stop Time  1100    OT Time Calculation (min)  43 min    Activity Tolerance  Patient tolerated treatment well       Past Medical History:  Diagnosis Date  . Arthritis    "knees" (12/17/2017)  . CAD S/P percutaneous coronary angioplasty 09/20/2016   Nstemi 08/2016. Dr. Algie Coffer. DES- brilinta and asa. Requests change to [facility] cardiology; 95% Ramus -> PCI Resolute Onyx DES 2.75 x 18  . Central cord syndrome (H[facility]) 12/17/2017  . Chronic combined systolic and diastolic heart failure (H[facility]) 10/09/2016   EF 45% and grade II diastolic after nstemi  . Diet-controlled diabetes mellitus ([facility])   . DJD (degenerative joint disease)   . Gout   . High cholesterol   . Hypertension   . NSTEMI (non-ST elevated myocardial infarction) ([facility]) 09/20/2016   95% Ramus - > PCI   . Obesity   . Recurrent pulmonary embolism ([facility]) 11/'14; 4/'19   a) Bilateral segmental and subsegmental pulmonary emboli with mild RV Strain.; b) after fall with C-spine Fxr --> Acute PE of right main pulmonary artery extending into multiple segments.    Past Surgical History:  Procedure Laterality Date  . CARDIAC CATHETERIZATION N/A 09/19/2016   Procedure: Left Heart Cath and Coronary Angiography;  Surgeon: Orpah Cobb, MD;  Location: [facility];  Service: Cardiovascular: 95% proximal Ramus Intermedius --> PCI  . CARDIAC CATHETERIZATION N/A  09/19/2016   Procedure: Coronary Stent Intervention;  Surgeon: Yvonne Kendall, MD;  Location: [facility] INVASIVE CV LAB;  Service: Cardiovascular: 95% ramus intermedius;  Resolute Onyx 2.75 x 18 mm drug-eluting stent  . CYSTOSCOPY/RETROGRADE/URETEROSCOPY/STONE EXTRACTION WITH BASKET  6962,9528   ureteral stone   . MENISCUS REPAIR Left    knee open meniscetomy  . TRANSTHORACIC ECHOCARDIOGRAM  2004   no lvh nl ejection fraction  . TRANSTHORACIC ECHOCARDIOGRAM  09/19/2016   In setting of NSTEMI:  EF 45-50% with diffuse hypokinesis. GR 2 DD. Mild biatrial enlargement.    There were no vitals filed for this visit.  Subjective Assessment - 06/24/18 1025    Subjective   I have stiffness in my arms and both knees but no real pain.  I think its partly due to the weather.     Patient is accompained by:  Family member    Pertinent History  C4 SCI tetraplegia    Currently in Pain?  No/denies                   OT Treatments/Exercises (OP) - 06/24/18 1206      Neurological Re-education Exercises   Other Exercises 1  Neuro re ed to address sit to stand, standing with improved postural alignment and control with emphasis on thoracic extension, lateral weight shifting  in standing with UE support and stand to sit. Also  addressed low reach unilateral closed chain with dowel to work toward low reach without shoulder hiking.  Initially worked on normal movement pattern and then with manual resistance. Worked forward back, side to side and "making square" for BUE"s - pt to incorporate into HEP.               OT Education - 06/24/18 1212    Education Details  upgraded HEP for BUE    Person(s) Educated  Patient    Methods  Explanation;Demonstration;Handout;Verbal cues    Comprehension  Verbalized understanding;Returned demonstration       OT Short Term Goals - 06/15/18 1646      OT SHORT TERM GOAL #1   Title  Patient will complete a home exercise program designed to improve AROM in  Bilateral shoulders with min assist (due 07/07/18)    Status  On-going      OT SHORT TERM GOAL #2   Title  Patient will complete a stand step transfer to commode with mod assist    Status  On-going      OT SHORT TERM GOAL #3   Title  Patient will demonstrate static stand with min assist with upper extremities out of support for 1 minute    Status  On-going      OT SHORT TERM GOAL #4   Title  Patient will transition from supine to sit with mod assist in preparation for return to regular (vs hospital) bed    Status  On-going      OT SHORT TERM GOAL #5   Title  Patient will begin graduated driving program - first will attempt to operate car in empty lot with another adult driver.      Status  On-going        OT Long Term Goals - 06/15/18 1646      OT LONG TERM GOAL #1   Title  Patient will complete an upgraded HEP designed to improve BUE strength and functional range of motion (due 08/06/18)    Status  On-going      OT LONG TERM GOAL #2   Title  Patient will be modified independnet with toilet hygiene    Status  On-going      OT LONG TERM GOAL #3   Title  Patient will complete shower using tub transfer bench and min assist    Status  On-going      OT LONG TERM GOAL #4   Title  Patient will demonstrate adequate dynamic stand balance tolerance (5 min) to assist with simple cooking or grilling task    Status  On-going      OT LONG TERM GOAL #5   Title  Patient will demonstrate ability to transition from sit to supine, supine to sit, and roll himself in regular bed in preparation for d/c HOSPITAL BED.      Status  On-going      OT LONG TERM GOAL #6   Title  Patient will return to driving    Status  On-going            Plan - 06/24/18 1212    Clinical Impression Statement  Pt progressing toward goals.  Able to add activity to HEP today for UE's    Occupational Profile and client history currently impacting functional performance  Husband, father, grandfather, retired  Air traffic controller, enjoys watching sports, water exercise class    Occupational performance deficits (Please refer to evaluation for details):  ADL's;IADL's;Rest and Sleep;Leisure  Rehab Potential  Good    OT Frequency  2x / week    OT Duration  8 weeks    OT Treatment/Interventions  Self-care/ADL training;Aquatic Therapy;Therapeutic exercise;Functional Mobility Training;Balance training;Manual Therapy;Neuromuscular education;Therapeutic activities;Energy conservation;DME and/or AE instruction;Moist Heat;Cryotherapy;Patient/family education    Plan  Shoulder ROM HEP - needs emphasis on ant deltoid - breathing, reduce neck activation;, Sit to supine / supine to sit in prep for d/c hospital bed, functional ambulation - opening doors, drawers, etc.      Consulted and Agree with Plan of Care  Patient       Patient will benefit from skilled therapeutic intervention in order to improve the following deficits and impairments:  Decreased skin integrity, Decreased knowledge of precautions, Improper body mechanics, Decreased activity tolerance, Decreased knowledge of use of DME, Decreased strength, Impaired flexibility, Decreased balance, Decreased mobility, Difficulty walking, Impaired sensation, Obesity, Decreased range of motion, Increased edema, Pain, Decreased coordination, Impaired UE functional use  Visit Diagnosis: Muscle weakness (generalized)  Unsteadiness on feet  Stiffness of right shoulder, not elsewhere classified  Stiffness of left shoulder, not elsewhere classified  Other disturbances of skin sensation    Problem List Patient Active Problem List   Diagnosis Date Noted  . Recurrent pulmonary embolism (HCC)   . Pain of left heel   . C4 spinal cord injury, sequela (HCC) 12/18/2017  . Tetraplegia (HCC) 12/18/2017  . Aortic aneurysm (HCC) 12/16/2017  . Cord compression (H[facility]) 12/13/2017  . Chronic combined systolic and diastolic heart failure (HCC) 10/09/2016  . CAD S/P percutaneous  coronary angioplasty 09/20/2016  . Erectile dysfunction 07/30/2015  . Morbid obesity with BMI of 50.0-59.9, adult (HCC) 07/18/2013  . History of pulmonary embolism 07/13/2013  . Angioedema of lips 07/13/2013  . Multinodular goiter 07/13/2013  . Dyslipidemia, goal LDL below 70 10/02/2011  . SOB (shortness of breath) 10/02/2011  . BPH with urinary obstruction 07/17/2010  . Diabetes mellitus type 2, controlled (HCC) 07/17/2010  . Bilateral lower extremity edema 12/08/2008  . NEUROPATHY, IDIOPATHIC PERIPHERAL NEC 05/31/2007  . Osteoarthritis 03/22/2007  . Gout 03/15/2007  . Essential hypertension 03/15/2007    Norton Pastel , OTR/L 06/24/2018, 12:19 PM  [facility] [address] [place], [place], [address] Phone: [phone]   Fax:  [phone]  Name: Franklin Hicks MRN: [id number] Date of Birth: October 11, 1950

## 2018-06-30 ENCOUNTER — Ambulatory Visit: Payer: Medicare Other | Admitting: Occupational Therapy

## 2018-06-30 ENCOUNTER — Encounter: Payer: Self-pay | Admitting: Rehabilitative and Restorative Service Providers"

## 2018-06-30 ENCOUNTER — Ambulatory Visit
Payer: Medicare Other | Attending: Physical Medicine & Rehabilitation | Admitting: Rehabilitative and Restorative Service Providers"

## 2018-06-30 ENCOUNTER — Ambulatory Visit: Payer: Medicare Other

## 2018-06-30 DIAGNOSIS — M6281 Muscle weakness (generalized): Secondary | ICD-10-CM | POA: Diagnosis not present

## 2018-06-30 DIAGNOSIS — M25611 Stiffness of right shoulder, not elsewhere classified: Secondary | ICD-10-CM | POA: Insufficient documentation

## 2018-06-30 DIAGNOSIS — R2681 Unsteadiness on feet: Secondary | ICD-10-CM

## 2018-06-30 DIAGNOSIS — R208 Other disturbances of skin sensation: Secondary | ICD-10-CM | POA: Diagnosis present

## 2018-06-30 DIAGNOSIS — R2689 Other abnormalities of gait and mobility: Secondary | ICD-10-CM | POA: Insufficient documentation

## 2018-06-30 DIAGNOSIS — M25612 Stiffness of left shoulder, not elsewhere classified: Secondary | ICD-10-CM | POA: Diagnosis present

## 2018-06-30 NOTE — Therapy (Signed)
[facility] [address] [place], [place], [address] Phone: [phone]   Fax:  [phone]  Physical Therapy Treatment  Patient Details  Name: Franklin Hicks MRN: [id number] Date of Birth: 08/24/1951 Referring Provider (PT): Alger Simons, MD   Encounter Date: 06/30/2018  PT End of Session - 06/30/18 9509    Visit Number  7    Number of Visits  25    Date for PT Re-Evaluation  08/24/18    Authorization Type  $20 copay [employer] medicare    PT Start Time  3267    PT Stop Time  1236    PT Time Calculation (min)  38 min    Activity Tolerance  Patient tolerated treatment well    Behavior During Therapy  [facility] for tasks assessed/performed       Past Medical History:  Diagnosis Date  . Arthritis    "knees" (12/17/2017)  . CAD S/P percutaneous coronary angioplasty 09/20/2016   Nstemi 08/2016. Dr. Doylene Canard. DES- brilinta and asa. Requests change to [facility] cardiology; 95% Ramus -> PCI Resolute Onyx DES 2.75 x 18  . Central cord syndrome ([place]) 12/17/2017  . Chronic combined systolic and diastolic heart failure ([place]) 10/09/2016   EF 45% and grade II diastolic after nstemi  . Diet-controlled diabetes mellitus ([place])   . DJD (degenerative joint disease)   . Gout   . High cholesterol   . Hypertension   . NSTEMI (non-ST elevated myocardial infarction) ([place]) 09/20/2016   95% Ramus - > PCI   . Obesity   . Recurrent pulmonary embolism ([place]) 11/'14; 4/'19   a) Bilateral segmental and subsegmental pulmonary emboli with mild RV Strain.; b) after fall with C-spine Fxr --> Acute PE of right main pulmonary artery extending into multiple segments.    Past Surgical History:  Procedure Laterality Date  . CARDIAC CATHETERIZATION N/A 09/19/2016   Procedure: Left Heart Cath and Coronary Angiography;  Surgeon: Dixie Dials, MD;  Location: [place] [facility];  Service: Cardiovascular: 95% proximal Ramus Intermedius --> PCI  . CARDIAC CATHETERIZATION N/A  09/19/2016   Procedure: Coronary Stent Intervention;  Surgeon: Nelva Bush, MD;  Location: [place] [facility];  Service: Cardiovascular: 95% ramus intermedius;  Resolute Onyx 2.75 x 18 mm drug-eluting stent  . CYSTOSCOPY/RETROGRADE/URETEROSCOPY/STONE EXTRACTION WITH BASKET  1245,8099   ureteral stone   . MENISCUS REPAIR Left    knee open meniscetomy  . TRANSTHORACIC ECHOCARDIOGRAM  2004   no lvh nl ejection fraction  . TRANSTHORACIC ECHOCARDIOGRAM  09/19/2016   In setting of NSTEMI:  EF 45-50% with diffuse hypokinesis. GR 2 DD. Mild biatrial enlargement.    There were no vitals filed for this visit.  Subjective Assessment - 06/30/18 1207    Subjective  Patient notes not walking as much as he should at home.  Barrier is having caregiver assist to push the w/c.    Pertinent History  history of CAD with chronic combined CHF, T2DM, morbid obesity, hypertension, PE in the past; venous stasis ulcers.    Patient Stated Goals  Returning to greatest level of independence.  (prior status was short distance walking with 2 canes).    Currently in Pain?  Yes   chronic stiffness/soreness in knees                      [facility] Adult PT Treatment/Exercise - 06/30/18 1208      Transfers   Transfers  Sit to Stand;Stand to Sit  Sit to Stand  6: Modified independent (Device/Increase time)    Stand to Sit  6: Modified independent (Device/Increase time)      Ambulation/Gait   Ambulation/Gait  Yes    Ambulation/Gait Assistance  5: Supervision    Ambulation/Gait Assistance Details  PT provided supervision.  Patient is completing walk with increased ease of movement.    Ambulation Distance (Feet)  120 Feet   with one rest break; then 30 feet.   Assistive device  Rolling walker    Gait Pattern  Decreased stance time - left;Decreased step length - right;Decreased step length - left;Poor foot clearance - left;Poor foot clearance - right;Wide base of support;Trunk flexed;Shuffle     Ambulation Surface  Level;Indoor      Therapeutic Activites    Therapeutic Activities  Other Therapeutic Activities    Other Therapeutic Activities  practiced lifting R leg onto bed to mimic home tranfsers into bed.  Discussed strategies for independence in the home.      Exercises   Exercises  Other Exercises    Other Exercises   thomas test stretch with passive overpressure x 2 reps each side; standing with gluteal sets, sit<>stand x 3 reps.                PT Short Term Goals - 06/30/18 1208      PT SHORT TERM GOAL #1   Title  The patient will return demo HEP emphasizing L knee flexion (chronic injury- may be able to get more flexion to use in functional transfers?)/extension, bilat LE strength, standing balance, and general conditioning. (STGs due 06/25/2018)    Baseline  PT working     Time  4    Period  Weeks    Status  Partially Met      PT SHORT TERM GOAL #2   Title  The patient will move sit<>stand mod indep to barriatric RW.    Time  4    Period  Weeks    Status  Achieved      PT SHORT TERM GOAL #3   Title  The patient will move sit<>supine mod indep to be able to get into/out of hospital bed in home.    Time  4    Period  Weeks    Status  Partially Met      PT SHORT TERM GOAL #4   Title  The patient will ambulate 100 ft with RW and close supervision to improve household ambulation.    Baseline  Patient ambulates 115 ft with RW, supervision and one rest break.    Time  4    Period  Weeks    Status  Partially Met      PT SHORT TERM GOAL #5   Title  The patient will be further assessed for gait speed and stairs when able.    Baseline  Gait speed=0.37 ft/sec with RW.  Stairs not safe at this time.    Time  4    Period  Weeks    Status  Partially Met        PT Long Term Goals - 05/26/18 1747      PT LONG TERM GOAL #1   Title  The patient will be indep with progression of HEP for post d/c strengthening and mobility. (LTG due date 08/24/2018)    Time   12    Period  Weeks    Target Date  08/24/18      PT LONG TERM GOAL #2  Title  The patient will be indep with bed mobility in standard bed (not relying on hospital bed).    Time  12    Period  Weeks    Target Date  08/24/18      PT LONG TERM GOAL #3   Title  The patient will ambulate x 400 ft with RW mod indep for household and short distance community mobility.    Time  12    Period  Weeks    Target Date  08/24/18      PT LONG TERM GOAL #4   Title  Further gait speed goal to follow.    Time  12    Period  Weeks    Target Date  08/24/18      PT LONG TERM GOAL #5   Title  Stair goal to follow, when appropriate    Time  12    Period  Weeks    Target Date  08/24/18            Plan - 06/30/18 1412    Clinical Impression Statement  The patient has partially met STGs.  PT is recommending more walking in the home with caregiver assistance.      PT Treatment/Interventions  ADLs/Self Care Home Management;Balance training;Neuromuscular re-education;Patient/family education;Gait training;Stair training;Functional mobility training;DME Instruction;Therapeutic activities;Therapeutic exercise    PT Next Visit Plan  Add HEP as appropriate for LE strengthening, Gait to increase distnace, sci-fit, knee /hip/ gastroc stretching.    Consulted and Agree with Plan of Care  Patient       Patient will benefit from skilled therapeutic intervention in order to improve the following deficits and impairments:  Abnormal gait, Impaired sensation, Obesity, Decreased activity tolerance, Decreased balance, Decreased strength, Pain, Decreased mobility, Difficulty walking, Decreased range of motion, Impaired flexibility, Postural dysfunction  Visit Diagnosis: Muscle weakness (generalized)  Unsteadiness on feet  Other abnormalities of gait and mobility     Problem List Patient Active Problem List   Diagnosis Date Noted  . Recurrent pulmonary embolism ([place])   . Pain of left heel   . C4  spinal cord injury, sequela ([place]) 12/18/2017  . Tetraplegia ([place]) 12/18/2017  . Aortic aneurysm ([place]) 12/16/2017  . Cord compression ([place]) 12/13/2017  . Chronic combined systolic and diastolic heart failure ([place]) 10/09/2016  . CAD S/P percutaneous coronary angioplasty 09/20/2016  . Erectile dysfunction 07/30/2015  . Morbid obesity with BMI of 50.0-59.9, adult ([place]) 07/18/2013  . History of pulmonary embolism 07/13/2013  . Angioedema of lips 07/13/2013  . Multinodular goiter 07/13/2013  . Dyslipidemia, goal LDL below 70 10/02/2011  . SOB (shortness of breath) 10/02/2011  . BPH with urinary obstruction 07/17/2010  . Diabetes mellitus type 2, controlled ([place]) 07/17/2010  . Bilateral lower extremity edema 12/08/2008  . NEUROPATHY, IDIOPATHIC PERIPHERAL NEC 05/31/2007  . Osteoarthritis 03/22/2007  . Gout 03/15/2007  . Essential hypertension 03/15/2007    Leilana Mcquire, PT 06/30/2018, 2:14 PM  [facility] [address] [place], [place], [address] Phone: [phone]   Fax:  [phone]  Name: MAKHARI DOVIDIO MRN: [id number] Date of Birth: Jul 27, 1951

## 2018-06-30 NOTE — Progress Notes (Signed)
Subjective:   Franklin Hicks is a 67 y.o. male who presents for Medicare Annual/Subsequent preventive examination.  Reports health as NSTEMI 09/20/2016 Currently in rehab; learning to use a walker  Fell in the tub;  Lives with at home w wife Sister in Sports coach and 2 grand kids   Discussed what to do about tub Shower seat   Biggest challenge is taking care of hygiene  Was in a brace x 90 days +   Asked about AAA    Diet Chol/hdl 3  A1c 6.5 diet controlled;  Watch the sugar intake Breakfast may have fruit and bacon and egg sandwich on wheat May have grits and eggs once a week; salmon and corn beer Lunch salad and chips; baked chicken; piece of fruit Another sandwich  Supper full meal vegetables and meat  May order out   Exercise Is in rehab tiw    Former smoker with 13 pack years; quit 76  Aortic aneurysm 4 cm; see note from MD    Health Maintenance Due  Topic Date Due  . OPHTHALMOLOGY EXAM  06/28/1959  . COLONOSCOPY  06/28/1999  . INFLUENZA VACCINE  03/25/2018   PSA 08/2017  To schedule eye exam, missed this year due to injury Will do so due to Diabetic labs; diet controlled   Colonoscopy ; had ordered the kit prior to his fall and will postpone until better   Will postpone flu vaccine   Cardiac Risk Factors include: advanced age (>30mn, >>30women);dyslipidemia;family history of premature cardiovascular disease;hypertension;male gender;obesity (BMI >30kg/m2);sedentary lifestyle      Objective:    Vitals: BP 104/60   Pulse 73   Ht 6' (1.829 m)   Wt (!) 360 lb (163.3 kg)   SpO2 97%   BMI 48.82 kg/m   Body mass index is 48.82 kg/m.  Advanced Directives 07/01/2018 06/24/2018 06/07/2018 12/17/2017 12/13/2017 12/16/2016 09/18/2016  Does Patient Have a Medical Advance Directive? _0  No No  Would patient like information on creating a medical advance directive? - No - Patient declined No - Patient declined No - Patient declined No - Patient declined No -  Patient declined No - Patient declined  Pre-existing out of facility DNR order (yellow form or pink MOST form) - - - - - - -   Has been discussed  Tobacco Social History   Tobacco Use  Smoking Status Former Smoker  . Packs/day: 1.00  . Years: 13.00  . Pack years: 13.00  . Types: Cigarettes  . Last attempt to quit: 07/13/1977  . Years since quitting: 40.9  Smokeless Tobacco Never Used     Counseling given: Yes   Clinical Intake:   Past Medical History:  Diagnosis Date  . Arthritis    "knees" (12/17/2017)  . CAD S/P percutaneous coronary angioplasty 09/20/2016   Nstemi 08/2016. Dr. KDoylene Canard DES- brilinta and asa. [facility] 95[facility] Ramus -> PCI Resolute Onyx DES 2.75 x 18  . [place] cord syndrome ([place] 12/17/2017  . Chronic combined systolic and diastolic heart failure ([place] 10/09/2016   EF 45% and grade II diastolic after nstemi  . Diet-controlled diabetes mellitus ([place]   . DJD (degenerative joint disease)   . Gout   . High cholesterol   . Hypertension   . NSTEMI (non-ST elevated myocardial infarction) ([place] 09/20/2016   95% Ramus - > PCI   . Obesity   . [place]urrent pulmonary [place]ism ([facility] 11/'14; 4/'19   a) Bilateral segmental and subsegmental  pulmonary emboli with mild RV Strain.; b) after fall with C-spine Fxr --> Acute PE of right main pulmonary artery extending into multiple segments.   Past Surgical History:  Procedure Laterality Date  . CARDIAC CATHETERIZATION N/A 09/19/2016   Procedure: Left Heart Cath and Coronary Angiography;  Surgeon: Dixie Dials, MD;  Location: [facility];  Service: Cardiovascular: 95% proximal Ramus Intermedius --> PCI  . CARDIAC CATHETERIZATION N/A 09/19/2016   Procedure: Coronary Stent Intervention;  Surgeon: Nelva Bush, MD;  Location: [facility];  Service: Cardiovascular: 95% ramus intermedius;  Resolute Onyx 2.75 x 18 mm drug-eluting stent  . CYSTOSCOPY/RETROGRADE/URETEROSCOPY/STONE EXTRACTION WITH  BASKET  2353,6144   ureteral stone   . MENISCUS REPAIR Left    knee open meniscetomy  . TRANSTHORACIC ECHOCARDIOGRAM  2004   no lvh nl ejection fraction  . TRANSTHORACIC ECHOCARDIOGRAM  09/19/2016   In setting of NSTEMI:  EF 45-50% with diffuse hypokinesis. GR 2 DD. Mild biatrial enlargement.   Family History  Problem Relation Age of Onset  . Liver disease Mother    Social History   Socioeconomic History  . Marital status: Married    Spouse name: Not on file  . Number of children: Not on file  . Years of education: Not on file  . Highest education level: Not on file  Occupational History  . Occupation: Pharmacist, hospital  Social Needs  . Financial resource strain: Not on file  . Food insecurity:    Worry: Not on file    Inability: Not on file  . Transportation needs:    Medical: Not on file    Non-medical: Not on file  Tobacco Use  . Smoking status: Former Smoker    Packs/day: 1.00    Years: 13.00    Pack years: 13.00    Types: Cigarettes    Last attempt to quit: 07/13/1977    Years since quitting: 40.9  . Smokeless tobacco: Never Used  Substance and Sexual Activity  . Alcohol use: Yes    Alcohol/week: 0.0 standard drinks    Comment: holidays only  . Drug use: No  . Sexual activity: Not on file  Lifestyle  . Physical activity:    Days per week: Not on file    Minutes per session: Not on file  . Stress: Not on file  Relationships  . Social connections:    Talks on phone: Not on file    Gets together: Not on file    Attends religious service: Not on file    Active member of club or organization: Not on file    Attends meetings of clubs or organizations: Not on file    Relationship status: Not on file  Other Topics Concern  . Not on file  Social History Narrative   Married for 44 years. He has 3 children and 5 grandchildren. They also 5 great grandchildren. He lives with his wife. He currently works as an Tourist information centre manager for [employer]    Occasional beer or wine   No smoking history or illicit drug use.   One hour water aerobics sessions 3 days a week. Has not started back yet read from recent illness sounds.    Outpatient Encounter Medications as of 07/01/2018  Medication Sig  . acetaminophen (TYLENOL) 325 MG tablet Take 1-2 tablets (325-650 mg total) by mouth every 4 (four) hours as needed for mild pain.  Marland Kitchen aspirin EC 81 MG tablet Take 1 tablet (81 mg total) by mouth  daily.  . atorvastatin (LIPITOR) 80 MG tablet TAKE 1 TABLET (80 MG TOTAL) BY MOUTH DAILY AT 6 PM.  . diclofenac sodium (VOLTAREN) 1 % GEL Apply 1 application topically 3 (three) times daily. Both knees  . docusate sodium (COLACE) 100 MG capsule Take 2 capsules (200 mg total) by mouth daily after supper.  . furosemide (LASIX) 20 MG tablet Take 1 tablet (20 mg total) by mouth every Monday, Wednesday, and Friday.  . gabapentin (NEURONTIN) 100 MG capsule Take 100 mg twice a day and 200 mg at bedtime  . metoprolol tartrate (LOPRESSOR) 50 MG tablet Take 1 tablet (50 mg total) by mouth 2 (two) times daily.  . Multiple Vitamin (MULTIVITAMIN WITH MINERALS) TABS tablet Take 1 tablet by mouth daily.  . Potassium Citrate 15 MEQ (1620 MG) TBCR Take 1 tablet by mouth 2 (two) times daily.  . rivaroxaban (XARELTO) 20 MG TABS tablet TAKE 1 TABLET (20 MG TOTAL) BY MOUTH DAILY WITH SUPPER.  Marland Kitchen terazosin (HYTRIN) 10 MG capsule TAKE 1 CAPSULE (10 MG TOTAL) BY MOUTH AT BEDTIME.  . vitamin B-12 (CYANOCOBALAMIN) 100 MCG tablet Take 1 tablet (100 mcg total) by mouth daily.  . [DISCONTINUED] terazosin (HYTRIN) 10 MG capsule Take 1 capsule (10 mg total) by mouth at bedtime. TAKE 1 CAPSULE (10 MG TOTAL) BY MOUTH AT BEDTIME.   No facility-administered encounter medications on file as of 07/01/2018.     Activities of Daily Living In your present state of health, do you have any difficulty performing the following activities: 07/01/2018 12/17/2017  Hearing? N -  [place]? N -    Difficulty concentrating or making decisions? N -  Walking or climbing stairs? N -  Dressing or bathing? N -  Comment - -  Doing errands, shopping? N N  Preparing Food and eating ? N -  Using the Toilet? N -  In the past six months, have you accidently leaked urine? N -  Do you have problems with loss of bowel control? N -  Managing your Medications? N -  Managing your Finances? N -  Housekeeping or managing your Housekeeping? N -  Some recent data might be hidden    Patient Care Team: Marin Olp, MD as PCP - General (Family Medicine)   Assessment:   This is a routine wellness examination for Wagner.  Exercise Activities and Dietary recommendations    Goals    . Patient Stated     Goal is to be independent again        Fall Risk Fall Risk  07/01/2018 04/12/2018 03/10/2018 12/16/2016 10/09/2016  Falls in the past year? 1 Yes Yes No No  Number falls in past yr: 0 1 1 - -  Injury with Fall? 1 Yes Yes - -  Risk for fall due to : Impaired balance/gait - - - -  Follow up Education provided - - - -   4/25 fell in bathtub with C spine compression; had PE   Depression Screen PHQ 2/9 Scores 07/01/2018 04/12/2018 03/10/2018 12/16/2016  PHQ - 2 Score 0 0 0 0    Cognitive Function MMSE - Mini Mental State Exam 07/01/2018  Not completed: (No Data)     Ad8 score reviewed for issues:  Issues making decisions:  Less interest in hobbies / activities:  Repeats questions, stories (family complaining):  Trouble using ordinary gadgets (microwave, computer, phone):  Forgets the month or year:   Mismanaging finances:   Remembering appts:  Daily  problems with thinking and/or memory: Ad8 score is=0       Immunization History  Administered Date(s) Administered  . Influenza Split 09/15/2011, 07/15/2012  . Influenza Whole 06/25/1998, 05/31/2007, 07/17/2010  . Influenza, High Dose Seasonal PF 09/24/2017  . Influenza,inj,Quad PF,6+ Mos 07/14/2013, 07/30/2015  . Td  08/26/1999, 12/08/2008      Screening Tests Health Maintenance  Topic Date Due  . OPHTHALMOLOGY EXAM  06/28/1959  . COLONOSCOPY  06/28/1999  . INFLUENZA VACCINE  03/25/2018  . PNA vac Low Risk Adult (1 of 2 - PCV13) 12/26/2019 (Originally 06/27/2014)  . FOOT EXAM  09/24/2018  . HEMOGLOBIN A1C  09/30/2018  . TETANUS/TDAP  12/09/2018  . Hepatitis C Screening  Completed         Plan:      PCP Notes   Health Maintenance PSA 08/2017  To schedule eye exam, missed this year due to injury Will do so due to Diabetic labs; diet controlled   Colonoscopy ; had ordered the kit prior to his fall and will postpone until better   Will postpone flu vaccine   Abnormal Screens  BMI 360 lb Eating and in rehab  Discussed adding fiber to diet to assist with weight control as well as monitoring portions  Referrals  none  Patient concerns; Has a spot on his leg from using a sliding board  It will go away and then it will come back  This area is below his hip  Last Monday -significant amount of blood on his pants. Baby asa and xalerto Seeing dr. Jerline Pain today   Nurse Concerns; As noted   Next PCP apt Recent and today       I have personally reviewed and noted the following in the patient's chart:   . Medical and social history . Use of alcohol, tobacco or illicit drugs  . Current medications and supplements . Functional ability and status . Nutritional status . Physical activity . Advanced directives . List of other physicians . Hospitalizations, surgeries, and ER visits in previous 12 months . Vitals . Screenings to include cognitive, depression, and falls . Referrals and appointments  In addition, I have reviewed and discussed with patient certain preventive protocols, quality metrics, and best practice recommendations. A written personalized care plan for preventive services as well as general preventive health recommendations were provided to patient.      Wynetta Fines, RN  07/01/2018

## 2018-06-30 NOTE — Therapy (Signed)
[facility] [address] [place], [place], [address] Phone: [phone]   Fax:  [phone]  Occupational Therapy Treatment  Patient Details  Name: Franklin Hicks MRN: [id number] Date of Birth: 02-02-1951 Referring Provider (OT): Jerrilyn Cairo Date: 06/30/2018  OT End of Session - 06/30/18 1425    Visit Number  5    Number of Visits  17    Date for OT Re-Evaluation  08/06/18    Authorization Type  Medicare    Authorization - Visit Number  5    Authorization - Number of Visits  10    OT Start Time  1235    OT Stop Time  1320    OT Time Calculation (min)  45 min    Activity Tolerance  Patient tolerated treatment well    Behavior During Therapy  [facility] for tasks assessed/performed       Past Medical History:  Diagnosis Date  . Arthritis    "knees" (12/17/2017)  . CAD S/P percutaneous coronary angioplasty 09/20/2016   Nstemi 08/2016. Dr. Algie Coffer. DES- brilinta and asa. Requests change to [facility] cardiology; 95% Ramus -> PCI Resolute Onyx DES 2.75 x 18  . Central cord syndrome (HCC) 12/17/2017  . Chronic combined systolic and diastolic heart failure (HCC) 10/09/2016   EF 45% and grade II diastolic after nstemi  . Diet-controlled diabetes mellitus (HCC)   . DJD (degenerative joint disease)   . Gout   . High cholesterol   . Hypertension   . NSTEMI (non-ST elevated myocardial infarction) (HCC) 09/20/2016   95% Ramus - > PCI   . Obesity   . Recurrent pulmonary embolism (HCC) 11/'14; 4/'19   a) Bilateral segmental and subsegmental pulmonary emboli with mild RV Strain.; b) after fall with C-spine Fxr --> Acute PE of right main pulmonary artery extending into multiple segments.    Past Surgical History:  Procedure Laterality Date  . CARDIAC CATHETERIZATION N/A 09/19/2016   Procedure: Left Heart Cath and Coronary Angiography;  Surgeon: Orpah Cobb, MD;  Location: [facility] INVASIVE CV LAB;  Service: Cardiovascular: 95% proximal  Ramus Intermedius --> PCI  . CARDIAC CATHETERIZATION N/A 09/19/2016   Procedure: Coronary Stent Intervention;  Surgeon: Yvonne Kendall, MD;  Location: [facility] INVASIVE CV LAB;  Service: Cardiovascular: 95% ramus intermedius;  Resolute Onyx 2.75 x 18 mm drug-eluting stent  . CYSTOSCOPY/RETROGRADE/URETEROSCOPY/STONE EXTRACTION WITH BASKET  6962,9528   ureteral stone   . MENISCUS REPAIR Left    knee open meniscetomy  . TRANSTHORACIC ECHOCARDIOGRAM  2004   no lvh nl ejection fraction  . TRANSTHORACIC ECHOCARDIOGRAM  09/19/2016   In setting of NSTEMI:  EF 45-50% with diffuse hypokinesis. GR 2 DD. Mild biatrial enlargement.    There were no vitals filed for this visit.  Subjective Assessment - 06/30/18 1244    Subjective   I did have an old Rt shoulder injury before my fall; I had some problems with it but never got it checked out    Pertinent History  C4 SCI tetraplegia    Currently in Pain?  Yes    Pain Score  2     Pain Location  Knee    Pain Orientation  Left    Pain Descriptors / Indicators  Tender;Sore        Supine: chest press motion and shoulder flexion initially with dowel, however switched to cylindrical thick foam noodle to change hand position and promote neutral shoulder rotation. Assessed P/ROM in each shoulder - pt  had initial pain Rt shoulder with extension, however went away with repetition and slight change in positioning.  Seated: worked on unilateral AA/ROM in shoulder flexion on diagonal plane for LUE, and in gravity elim plane for RUE. Progressed to moving from sh flexion to horizontal abduction RUE w/ focus in scapula retraction without sh hiking or sh abduction.  Worked in standing at [employer] for upright posture as able (tight hip flexors) and disengaging 1 arm at a time in prep for functional tasks                     OT Short Term Goals - 06/15/18 1646      OT SHORT TERM GOAL #1   Title  Patient will complete a home exercise program designed to  improve AROM in Bilateral shoulders with min assist (due 07/07/18)    Status  On-going      OT SHORT TERM GOAL #2   Title  Patient will complete a stand step transfer to commode with mod assist    Status  On-going      OT SHORT TERM GOAL #3   Title  Patient will demonstrate static stand with min assist with upper extremities out of support for 1 minute    Status  On-going      OT SHORT TERM GOAL #4   Title  Patient will transition from supine to sit with mod assist in preparation for return to regular (vs hospital) bed    Status  On-going      OT SHORT TERM GOAL #5   Title  Patient will begin graduated driving program - first will attempt to operate car in empty lot with another adult driver.      Status  On-going        OT Long Term Goals - 06/15/18 1646      OT LONG TERM GOAL #1   Title  Patient will complete an upgraded HEP designed to improve BUE strength and functional range of motion (due 08/06/18)    Status  On-going      OT LONG TERM GOAL #2   Title  Patient will be modified independnet with toilet hygiene    Status  On-going      OT LONG TERM GOAL #3   Title  Patient will complete shower using tub transfer bench and min assist    Status  On-going      OT LONG TERM GOAL #4   Title  Patient will demonstrate adequate dynamic stand balance tolerance (5 min) to assist with simple cooking or grilling task    Status  On-going      OT LONG TERM GOAL #5   Title  Patient will demonstrate ability to transition from sit to supine, supine to sit, and roll himself in regular bed in preparation for d/c HOSPITAL BED.      Status  On-going      OT LONG TERM GOAL #6   Title  Patient will return to driving    Status  On-going            Plan - 06/30/18 1426    Clinical Impression Statement  Pt progressing towards goals. Pt with mild premorbid Rt shoulder deficits    Occupational Profile and client history currently impacting functional performance  Husband, father,  grandfather, retired Air traffic controller, enjoys watching sports, water exercise class    Occupational performance deficits (Please refer to evaluation for details):  ADL's;IADL's;Rest and Sleep;Leisure  Rehab Potential  Good    OT Frequency  2x / week    OT Duration  8 weeks    OT Treatment/Interventions  Self-care/ADL training;Aquatic Therapy;Therapeutic exercise;Functional Mobility Training;Balance training;Manual Therapy;Neuromuscular education;Therapeutic activities;Energy conservation;DME and/or AE instruction;Moist Heat;Cryotherapy;Patient/family education    Plan  Shoulder ROM HEP - needs emphasis on ant deltoid - breathing, reduce neck activation;, Sit to supine / supine to sit in prep for d/c hospital bed, functional ambulation - opening doors, drawers, etc.      Consulted and Agree with Plan of Care  Patient       Patient will benefit from skilled therapeutic intervention in order to improve the following deficits and impairments:  Decreased skin integrity, Decreased knowledge of precautions, Improper body mechanics, Decreased activity tolerance, Decreased knowledge of use of DME, Decreased strength, Impaired flexibility, Decreased balance, Decreased mobility, Difficulty walking, Impaired sensation, Obesity, Decreased range of motion, Increased edema, Pain, Decreased coordination, Impaired UE functional use  Visit Diagnosis: Muscle weakness (generalized)  Unsteadiness on feet  Stiffness of right shoulder, not elsewhere classified  Stiffness of left shoulder, not elsewhere classified    Problem List Patient Active Problem List   Diagnosis Date Noted  . Recurrent pulmonary embolism (HCC)   . Pain of left heel   . C4 spinal cord injury, sequela (HCC) 12/18/2017  . Tetraplegia (HCC) 12/18/2017  . Aortic aneurysm (HCC) 12/16/2017  . Cord compression (HCC) 12/13/2017  . Chronic combined systolic and diastolic heart failure (HCC) 10/09/2016  . CAD S/P percutaneous coronary angioplasty  09/20/2016  . Erectile dysfunction 07/30/2015  . Morbid obesity with BMI of 50.0-59.9, adult (HCC) 07/18/2013  . History of pulmonary embolism 07/13/2013  . Angioedema of lips 07/13/2013  . Multinodular goiter 07/13/2013  . Dyslipidemia, goal LDL below 70 10/02/2011  . SOB (shortness of breath) 10/02/2011  . BPH with urinary obstruction 07/17/2010  . Diabetes mellitus type 2, controlled (HCC) 07/17/2010  . Bilateral lower extremity edema 12/08/2008  . NEUROPATHY, IDIOPATHIC PERIPHERAL NEC 05/31/2007  . Osteoarthritis 03/22/2007  . Gout 03/15/2007  . Essential hypertension 03/15/2007    Kelli Churn, OTR/L 06/30/2018, 2:29 PM  [facility] [address] [place], [place], [address] Phone: [phone]   Fax:  [phone]  Name: Franklin Hicks MRN: [id number] Date of Birth: [birth year]

## 2018-07-01 ENCOUNTER — Encounter: Payer: Self-pay | Admitting: Family Medicine

## 2018-07-01 ENCOUNTER — Ambulatory Visit: Payer: Medicare Other | Admitting: Family Medicine

## 2018-07-01 ENCOUNTER — Ambulatory Visit (INDEPENDENT_AMBULATORY_CARE_PROVIDER_SITE_OTHER): Payer: Medicare Other

## 2018-07-01 VITALS — BP 104/60 | HR 73 | Ht 72.0 in | Wt 360.0 lb

## 2018-07-01 VITALS — BP 104/60 | HR 73 | Temp 98.6°F | Ht 72.0 in | Wt 360.0 lb

## 2018-07-01 DIAGNOSIS — Z0000 Encounter for general adult medical examination without abnormal findings: Secondary | ICD-10-CM

## 2018-07-01 DIAGNOSIS — L739 Follicular disorder, unspecified: Secondary | ICD-10-CM | POA: Diagnosis not present

## 2018-07-01 NOTE — Progress Notes (Signed)
I have reviewed and agree with note, evaluation, plan.  Thankful Dr. Jimmey Ralph was able to evaluate patient today.  Tana Conch, MD

## 2018-07-01 NOTE — Progress Notes (Signed)
   Subjective:  Franklin Hicks is a 68 y.o. male who presents today with a chief complaint of skin lesion.   HPI:  Skin Lesion, new problem Started several months ago. Located on left leg. Has noticed some bleeding to the area over the last few days. Felt sore, but not very painful. No treatments tried.   ROS: Per HPI  PMH: He reports that he quit smoking about 40 years ago. His smoking use included cigarettes. He has a 13.00 pack-year smoking history. He has never used smokeless tobacco. He reports that he drinks alcohol. He reports that he does not use drugs.  Objective:  Physical Exam: BP 104/60 (BP Location: Left Arm, Patient Position: Sitting, Cuff Size: Large)   Pulse 73   Temp 98.6 F (37 C) (Oral)   Ht 6' (1.829 m)   Wt (!) 360 lb (163.3 kg)   SpO2 97%   BMI 48.82 kg/m   Gen: NAD, resting comfortably CV: RRR with no murmurs appreciated Pulm: NWOB, CTAB with no crackles, wheezes, or rhonchi Skin: 8 mm indurated area on the left posterior medial thigh.  Assessment/Plan:  Folliculitis No red flags.  No signs of pressure ulcer.  No signs of infection.  He has some induration on his left posterior medial thigh that is consistent with resolving folliculitis.  No other open areas or areas of concern.  Reassured patient.  Can use topical antibiotic ointment if needed.  Expect that this will heal fully.  Discussed reasons to return to care.  Katina Degree. Jimmey Ralph, MD 07/01/2018 4:04 PM

## 2018-07-01 NOTE — Patient Instructions (Signed)
It was very nice to see you today!  The spot on your leg is due to inflamed hair follicle.  You have no signs of infection or pressure ulcer.  This should continue to heal over the next few weeks.  Please let me or let Dr. Durene Cal know if your symptoms worsen in any way.  Take care, Dr Jimmey Ralph

## 2018-07-01 NOTE — Patient Instructions (Addendum)
Franklin Hicks , Thank you for taking time to come for your Medicare Wellness Visit. I appreciate your ongoing commitment to your health goals. Please review the following plan we discussed and let me know if I can assist you in the future.   Request to hold flu but will discuss with Dr. Jimmey Ralph   Ask Dr. Durene Cal about the AAA; it appeared to be ? Outside of norm in diameter   Will see Dr. Jimmey Ralph at 4pm today to fup on "area of concern" left hip.   Will postpone until he is walking better unless you have some rectal bleeding   Will schedule an eye exam when you can    These are the goals we discussed: Goals    . Patient Stated     Goal is to be independent again        This is a Hicks of the screening recommended for you and due dates:  Health Maintenance  Topic Date Due  . Eye exam for diabetics  [birth year]10/1960  . Colon Cancer Screening  69/10/1998  . Flu Shot  03/25/2018  . Pneumonia vaccines (1 of 2 - PCV13) 69/10/2019*  . Complete foot exam   69/31/2020  . Hemoglobin A1C  69/01/2019  . Tetanus Vaccine  69/16/2020  .  Hepatitis C: One time screening is recommended by Center for Disease Control  (CDC) for  adults born from [birth year] through 1965.   Completed  *Topic was postponed. The date shown is not the original due date.      Fall Prevention in the Home Falls can cause injuries. They can happen to people of all ages. There are many things you can do to make your home safe and to help prevent falls. What can I do on the outside of my home?  Regularly fix the edges of walkways and driveways and fix any cracks.  Remove anything that might make you trip as you walk through a door, such as a raised step or threshold.  Trim any bushes or trees on the path to your home.  Use bright outdoor lighting.  Clear any walking paths of anything that might make someone trip, such as rocks or tools.  Regularly check to see if handrails are loose or broken. Make sure that both sides of any  steps have handrails.  Any raised decks and porches should have guardrails on the edges.  Have any leaves, snow, or ice cleared regularly.  Use sand or salt on walking paths during winter.  Clean up any spills in your garage right away. This includes oil or grease spills. What can I do in the bathroom?  Use night lights.  Install grab bars by the toilet and in the tub and shower. Do not use towel bars as grab bars.  Use non-skid mats or decals in the tub or shower.  If you need to sit down in the shower, use a plastic, non-slip stool.  Keep the floor dry. Clean up any water that spills on the floor as soon as it happens.  Remove soap buildup in the tub or shower regularly.  Attach bath mats securely with double-sided non-slip rug tape.  Do not have throw rugs and other things on the floor that can make you trip. What can I do in the bedroom?  Use night lights.  Make sure that you have a light by your bed that is easy to reach.  Do not use any sheets or blankets that are too big for  your bed. They should not hang down onto the floor.  Have a firm chair that has side arms. You can use this for support while you get dressed.  Do not have throw rugs and other things on the floor that can make you trip. What can I do in the kitchen?  Clean up any spills right away.  Avoid walking on wet floors.  Keep items that you use a lot in easy-to-reach places.  If you need to reach something above you, use a strong step stool that has a grab bar.  Keep electrical cords out of the way.  Do not use floor polish or wax that makes floors slippery. If you must use wax, use non-skid floor wax.  Do not have throw rugs and other things on the floor that can make you trip. What can I do with my stairs?  Do not leave any items on the stairs.  Make sure that there are handrails on both sides of the stairs and use them. Fix handrails that are broken or loose. Make sure that handrails are  as long as the stairways.  Check any carpeting to make sure that it is firmly attached to the stairs. Fix any carpet that is loose or worn.  Avoid having throw rugs at the top or bottom of the stairs. If you do have throw rugs, attach them to the floor with carpet tape.  Make sure that you have a light switch at the top of the stairs and the bottom of the stairs. If you do not have them, ask someone to add them for you. What else can I do to help prevent falls?  Wear shoes that: ? Do not have high heels. ? Have rubber bottoms. ? Are comfortable and fit you well. ? Are closed at the toe. Do not wear sandals.  If you use a stepladder: ? Make sure that it is fully opened. Do not climb a closed stepladder. ? Make sure that both sides of the stepladder are locked into place. ? Ask someone to hold it for you, if possible.  Clearly mark and make sure that you can see: ? Any grab bars or handrails. ? First and last steps. ? Where the edge of each step is.  Use tools that help you move around (mobility aids) if they are needed. These include: ? Canes. ? Walkers. ? Scooters. ? Crutches.  Turn on the lights when you go into a dark area. Replace any light bulbs as soon as they burn out.  Set up your furniture so you have a clear path. Avoid moving your furniture around.  If any of your floors are uneven, fix them.  If there are any pets around you, be aware of where they are.  Review your medicines with your doctor. Some medicines can make you feel dizzy. This can increase your chance of falling. Ask your doctor what other things that you can do to help prevent falls. This information is not intended to replace advice given to you by your health care provider. Make sure you discuss any questions you have with your health care provider. Document Released: 06/07/2009 Document Revised: 01/17/2016 Document Reviewed: 09/15/2014 Elsevier Interactive Patient Education  2018 [employer].   Health Maintenance, Male A healthy lifestyle and preventive care is important for your health and wellness. Ask your health care provider about what schedule of regular examinations is right for you. What should I know about weight and diet? Eat a Healthy  Diet  Eat plenty of vegetables, fruits, whole grains, low-fat dairy products, and lean protein.  Do not eat a lot of foods high in solid fats, added sugars, or salt.  Maintain a Healthy Weight Regular exercise can help you achieve or maintain a healthy weight. You should:  Do at least 150 minutes of exercise each week. The exercise should increase your heart rate and make you sweat (moderate-intensity exercise).  Do strength-training exercises at least twice a week.  Watch Your Levels of Cholesterol and Blood Lipids  Have your blood tested for lipids and cholesterol every 5 years starting at 69 years of age. If you are at high risk for heart disease, you should start having your blood tested when you are 69 years old. You may need to have your cholesterol levels checked more often if: ? Your lipid or cholesterol levels are high. ? You are older than 69 years of age. ? You are at high risk for heart disease.  What should I know about cancer screening? Many types of cancers can be detected early and may often be prevented. Lung Cancer  You should be screened every year for lung cancer if: ? You are a current smoker who has smoked for at least 30 years. ? You are a former smoker who has quit within the past 15 years.  Talk to your health care provider about your screening options, when you should start screening, and how often you should be screened.  Colorectal Cancer  Routine colorectal cancer screening usually begins at 70 years of age and should be repeated every 5-10 years until you are 69 years old. You may need to be screened more often if early forms of precancerous polyps or small growths are found. Your health  care provider may recommend screening at an earlier age if you have risk factors for colon cancer.  Your health care provider may recommend using home test kits to check for hidden blood in the stool.  A small camera at the end of a tube can be used to examine your colon (sigmoidoscopy or colonoscopy). This checks for the earliest forms of colorectal cancer.  Prostate and Testicular Cancer  Depending on your age and overall health, your health care provider may do certain tests to screen for prostate and testicular cancer.  Talk to your health care provider about any symptoms or concerns you have about testicular or prostate cancer.  Skin Cancer  Check your skin from head to toe regularly.  Tell your health care provider about any new moles or changes in moles, especially if: ? There is a change in a mole's size, shape, or color. ? You have a mole that is larger than a pencil eraser.  Always use sunscreen. Apply sunscreen liberally and repeat throughout the day.  Protect yourself by wearing long sleeves, pants, a wide-brimmed hat, and sunglasses when outside.  What should I know about heart disease, diabetes, and high blood pressure?  If you are 86-52 years of age, have your blood pressure checked every 3-5 years. If you are 27 years of age or older, have your blood pressure checked every year. You should have your blood pressure measured twice-once when you are at a hospital or clinic, and once when you are not at a hospital or clinic. Record the average of the two measurements. To check your blood pressure when you are not at a hospital or clinic, you can use: ? An automated blood pressure machine at a pharmacy. ?  A home blood pressure monitor.  Talk to your health care provider about your target blood pressure.  If you are between 53-38 years old, ask your health care provider if you should take aspirin to prevent heart disease.  Have regular diabetes screenings by checking your  fasting blood sugar level. ? If you are at a normal weight and have a low risk for diabetes, have this test once every three years after the age of 70. ? If you are overweight and have a high risk for diabetes, consider being tested at a younger age or more often.  A one-time screening for abdominal aortic aneurysm (AAA) by ultrasound is recommended for men aged 65-75 years who are current or former smokers. What should I know about preventing infection? Hepatitis B If you have a higher risk for hepatitis B, you should be screened for this virus. Talk with your health care provider to find out if you are at risk for hepatitis B infection. Hepatitis C Blood testing is recommended for:  Everyone born from [birth year] through 1965.  Anyone with known risk factors for hepatitis C.  Sexually Transmitted Diseases (STDs)  You should be screened each year for STDs including gonorrhea and chlamydia if: ? You are sexually active and are younger than 69 years of age. ? You are older than 69 years of age and your health care provider tells you that you are at risk for this type of infection. ? Your sexual activity has changed since you were last screened and you are at an increased risk for chlamydia or gonorrhea. Ask your health care provider if you are at risk.  Talk with your health care provider about whether you are at high risk of being infected with HIV. Your health care provider may recommend a prescription medicine to help prevent HIV infection.  What else can I do?  Schedule regular health, dental, and eye exams.  Stay current with your vaccines (immunizations).  Do not use any tobacco products, such as cigarettes, chewing tobacco, and e-cigarettes. If you need help quitting, ask your health care provider.  Limit alcohol intake to no more than 2 drinks per day. One drink equals 12 ounces of beer, 5 ounces of wine, or 1 ounces of hard liquor.  Do not use street drugs.  Do not share  needles.  Ask your health care provider for help if you need support or information about quitting drugs.  Tell your health care provider if you often feel depressed.  Tell your health care provider if you have ever been abused or do not feel safe at home. This information is not intended to replace advice given to you by your health care provider. Make sure you discuss any questions you have with your health care provider. Document Released: 02/07/2008 Document Revised: 04/09/2016 Document Reviewed: 05/15/2015 Elsevier Interactive Patient Education  [employer].

## 2018-07-02 ENCOUNTER — Encounter: Payer: Self-pay | Admitting: Occupational Therapy

## 2018-07-02 ENCOUNTER — Ambulatory Visit: Payer: Medicare Other | Admitting: Rehabilitative and Restorative Service Providers"

## 2018-07-02 ENCOUNTER — Ambulatory Visit: Payer: Medicare Other | Admitting: Occupational Therapy

## 2018-07-02 DIAGNOSIS — R2689 Other abnormalities of gait and mobility: Secondary | ICD-10-CM

## 2018-07-02 DIAGNOSIS — M6281 Muscle weakness (generalized): Secondary | ICD-10-CM | POA: Diagnosis not present

## 2018-07-02 DIAGNOSIS — M25612 Stiffness of left shoulder, not elsewhere classified: Secondary | ICD-10-CM

## 2018-07-02 DIAGNOSIS — R2681 Unsteadiness on feet: Secondary | ICD-10-CM

## 2018-07-02 DIAGNOSIS — M25611 Stiffness of right shoulder, not elsewhere classified: Secondary | ICD-10-CM

## 2018-07-02 DIAGNOSIS — R208 Other disturbances of skin sensation: Secondary | ICD-10-CM

## 2018-07-02 NOTE — Therapy (Signed)
[facility] Rehabilitation [facility] [address] [place], [place], [address] Phone: [phone]   Fax:  [phone]  Occupational Therapy Treatment  Patient Details  Name: Franklin Hicks MRN: [id number] Date of Birth: March 05, 1951 Referring Provider (OT): Jerrilyn Cairo Date: 07/02/2018  OT End of Session - 07/02/18 1248    Visit Number  6    Number of Visits  17    Date for OT Re-Evaluation  08/06/18    Authorization Type  Medicare    Authorization - Visit Number  6    Authorization - Number of Visits  10    OT Start Time  1148    OT Stop Time  1230    OT Time Calculation (min)  42 min    Activity Tolerance  Patient tolerated treatment well    Behavior During Therapy  [facility] for tasks assessed/performed       Past Medical History:  Diagnosis Date  . Arthritis    "knees" (12/17/2017)  . CAD S/P percutaneous coronary angioplasty 09/20/2016   Nstemi 08/2016. Dr. Algie Coffer. DES- brilinta and asa. Requests change to [facility] cardiology; 95% Ramus -> PCI Resolute Onyx DES 2.75 x 18  . Central cord syndrome (HCC) 12/17/2017  . Chronic combined systolic and diastolic heart failure (HCC) 10/09/2016   EF 45% and grade II diastolic after nstemi  . Diet-controlled diabetes mellitus (HCC)   . DJD (degenerative joint disease)   . Gout   . High cholesterol   . Hypertension   . NSTEMI (non-ST elevated myocardial infarction) (HCC) 09/20/2016   95% Ramus - > PCI   . Obesity   . Recurrent pulmonary embolism (HCC) 11/'14; 4/'19   a) Bilateral segmental and subsegmental pulmonary emboli with mild RV Strain.; b) after fall with C-spine Fxr --> Acute PE of right main pulmonary artery extending into multiple segments.    Past Surgical History:  Procedure Laterality Date  . CARDIAC CATHETERIZATION N/A 09/19/2016   Procedure: Left Heart Cath and Coronary Angiography;  Surgeon: Orpah Cobb, MD;  Location: [facility] INVASIVE CV LAB;  Service: Cardiovascular: 95% proximal  Ramus Intermedius --> PCI  . CARDIAC CATHETERIZATION N/A 09/19/2016   Procedure: Coronary Stent Intervention;  Surgeon: Yvonne Kendall, MD;  Location: [facility] INVASIVE CV LAB;  Service: Cardiovascular: 95% ramus intermedius;  Resolute Onyx 2.75 x 18 mm drug-eluting stent  . CYSTOSCOPY/RETROGRADE/URETEROSCOPY/STONE EXTRACTION WITH BASKET  6962,9528   ureteral stone   . MENISCUS REPAIR Left    knee open meniscetomy  . TRANSTHORACIC ECHOCARDIOGRAM  2004   no lvh nl ejection fraction  . TRANSTHORACIC ECHOCARDIOGRAM  09/19/2016   In setting of NSTEMI:  EF 45-50% with diffuse hypokinesis. GR 2 DD. Mild biatrial enlargement.    There were no vitals filed for this visit.  Subjective Assessment - 07/02/18 1157    Subjective   Patient feels good about standing and walking and feels this will help him with hygiene.      Patient is accompained by:  Family member    Pertinent History  C4 SCI tetraplegia    Currently in Pain?  No/denies    Pain Score  0-No pain                   OT Treatments/Exercises (OP) - 07/02/18 0001      ADLs   Toileting  Simulated toileting skills - sustained dynamic squat position with trunk rotation to reach behind and between,  Patient with limited right shoulder motion to reach  across body to left.  Patient able to maintain squat position for 45 seconds multiple times.        Neurological Re-education Exercises   Other Exercises 1  Neuromuscular reeducation in supine to address reach pattern.  In right upper extremity, patient with capsular tightness, and decreased anterior deltoid activation.  Patient overusing shoulder elevation and cervical extension to lift arm.  Patient also with strong internal rotational component to forward reach.  Working within pain free range to normalize balance of activity in reach - BUE's             OT Education - 07/02/18 1247    Education Details  Patient has addressed components of toileting, and needs to begin to  implement at home.      Person(s) Educated  Patient    Methods  Explanation;Demonstration;Verbal cues;Tactile cues    Comprehension  Verbalized understanding;Returned demonstration       OT Short Term Goals - 07/02/18 1250      OT SHORT TERM GOAL #1   Title  Patient will complete a home exercise program designed to improve AROM in Bilateral shoulders with min assist (due 07/07/18)    Status  On-going      OT SHORT TERM GOAL #2   Title  Patient will complete a stand step transfer to commode with mod assist    Status  On-going      OT SHORT TERM GOAL #3   Title  Patient will demonstrate static stand with min assist with upper extremities out of support for 1 minute    Status  Achieved      OT SHORT TERM GOAL #4   Title  Patient will transition from supine to sit with mod assist in preparation for return to regular (vs hospital) bed    Status  Achieved      OT SHORT TERM GOAL #5   Title  Patient will begin graduated driving program - first will attempt to operate car in empty lot with another adult driver.      Status  On-going        OT Long Term Goals - 06/15/18 1646      OT LONG TERM GOAL #1   Title  Patient will complete an upgraded HEP designed to improve BUE strength and functional range of motion (due 08/06/18)    Status  On-going      OT LONG TERM GOAL #2   Title  Patient will be modified independnet with toilet hygiene    Status  On-going      OT LONG TERM GOAL #3   Title  Patient will complete shower using tub transfer bench and min assist    Status  On-going      OT LONG TERM GOAL #4   Title  Patient will demonstrate adequate dynamic stand balance tolerance (5 min) to assist with simple cooking or grilling task    Status  On-going      OT LONG TERM GOAL #5   Title  Patient will demonstrate ability to transition from sit to supine, supine to sit, and roll himself in regular bed in preparation for d/c HOSPITAL BED.      Status  On-going      OT LONG TERM  GOAL #6   Title  Patient will return to driving    Status  On-going            Plan - 07/02/18 1249    Clinical Impression Statement  Patient progressing with functional mobility, and is reporting decreased overall pain in shoulders.      Occupational Profile and client history currently impacting functional performance  Husband, father, grandfather, retired Air traffic controller, enjoys watching sports, water exercise class    Occupational performance deficits (Please refer to evaluation for details):  ADL's;IADL's;Rest and Sleep;Leisure    Rehab Potential  Good    OT Frequency  2x / week    OT Duration  8 weeks    OT Treatment/Interventions  Self-care/ADL training;Aquatic Therapy;Therapeutic exercise;Functional Mobility Training;Balance training;Manual Therapy;Neuromuscular education;Therapeutic activities;Energy conservation;DME and/or AE instruction;Moist Heat;Cryotherapy;Patient/family education    Plan  Shoulder ROM HEP - needs emphasis on ant deltoid - breathing, reduce neck activation;, Sit to supine / supine to sit in prep for d/c hospital bed, functional ambulation - opening doors, drawers, etc.      Clinical Decision Making  Several treatment options, min-mod task modification necessary    Consulted and Agree with Plan of Care  Patient       Patient will benefit from skilled therapeutic intervention in order to improve the following deficits and impairments:  Decreased skin integrity, Decreased knowledge of precautions, Improper body mechanics, Decreased activity tolerance, Decreased knowledge of use of DME, Decreased strength, Impaired flexibility, Decreased balance, Decreased mobility, Difficulty walking, Impaired sensation, Obesity, Decreased range of motion, Increased edema, Pain, Decreased coordination, Impaired UE functional use  Visit Diagnosis: Muscle weakness (generalized)  Unsteadiness on feet  Stiffness of right shoulder, not elsewhere classified  Stiffness of left  shoulder, not elsewhere classified  Other disturbances of skin sensation    Problem List Patient Active Problem List   Diagnosis Date Noted  . Recurrent pulmonary embolism (HCC)   . Pain of left heel   . C4 spinal cord injury, sequela (HCC) 12/18/2017  . Tetraplegia (HCC) 12/18/2017  . Aortic aneurysm (HCC) 12/16/2017  . Cord compression (HCC) 12/13/2017  . Chronic combined systolic and diastolic heart failure (HCC) 10/09/2016  . CAD S/P percutaneous coronary angioplasty 09/20/2016  . Erectile dysfunction 07/30/2015  . Morbid obesity with BMI of 50.0-59.9, adult (HCC) 07/18/2013  . History of pulmonary embolism 07/13/2013  . Angioedema of lips 07/13/2013  . Multinodular goiter 07/13/2013  . Dyslipidemia, goal LDL below 70 10/02/2011  . SOB (shortness of breath) 10/02/2011  . BPH with urinary obstruction 07/17/2010  . Diabetes mellitus type 2, controlled (HCC) 07/17/2010  . Bilateral lower extremity edema 12/08/2008  . NEUROPATHY, IDIOPATHIC PERIPHERAL NEC 05/31/2007  . Osteoarthritis 03/22/2007  . Gout 03/15/2007  . Essential hypertension 03/15/2007    Collier [place] , OTR/L 07/02/2018, 12:51 PM  [facility] [address] [place], [place], [address] Phone: [phone]   Fax:  [phone]  Name: Franklin Hicks MRN: [id number] Date of Birth: Jul 26, 1951

## 2018-07-02 NOTE — Therapy (Signed)
[facility] [address] [place], [place], [address] Phone: [phone]   Fax:  [phone]  Physical Therapy Treatment  Patient Details  Name: Franklin Hicks MRN: [id number] Date of Birth: April 16, 1951 Referring Provider (PT): Alger Simons, MD   Encounter Date: 07/02/2018  PT End of Session - 07/02/18 1335    Visit Number  8    Number of Visits  25    Date for PT Re-Evaluation  08/24/18    Authorization Type  $20 copay [employer] medicare    PT Start Time  5465    PT Stop Time  1320    PT Time Calculation (min)  45 min    Activity Tolerance  Patient tolerated treatment well    Behavior During Therapy  [facility] for tasks assessed/performed       Past Medical History:  Diagnosis Date  . Arthritis    "knees" (12/17/2017)  . CAD S/P percutaneous coronary angioplasty 09/20/2016   Nstemi 08/2016. Dr. Doylene Canard. DES- brilinta and asa. Requests change to [facility] cardiology; 95% Ramus -> PCI Resolute Onyx DES 2.75 x 18  . Central cord syndrome ([place]) 12/17/2017  . Chronic combined systolic and diastolic heart failure ([place]) 10/09/2016   EF 45% and grade II diastolic after nstemi  . Diet-controlled diabetes mellitus ([place])   . DJD (degenerative joint disease)   . Gout   . High cholesterol   . Hypertension   . NSTEMI (non-ST elevated myocardial infarction) ([place]) 09/20/2016   95% Ramus - > PCI   . Obesity   . Recurrent pulmonary embolism ([place]) 11/'14; 4/'19   a) Bilateral segmental and subsegmental pulmonary emboli with mild RV Strain.; b) after fall with C-spine Fxr --> Acute PE of right main pulmonary artery extending into multiple segments.    Past Surgical History:  Procedure Laterality Date  . CARDIAC CATHETERIZATION N/A 09/19/2016   Procedure: Left Heart Cath and Coronary Angiography;  Surgeon: Dixie Dials, MD;  Location: [place] CV LAB;  Service: Cardiovascular: 95% proximal Ramus Intermedius --> PCI  . CARDIAC CATHETERIZATION N/A  09/19/2016   Procedure: Coronary Stent Intervention;  Surgeon: Nelva Bush, MD;  Location: [place] CV LAB;  Service: Cardiovascular: 95% ramus intermedius;  Resolute Onyx 2.75 x 18 mm drug-eluting stent  . CYSTOSCOPY/RETROGRADE/URETEROSCOPY/STONE EXTRACTION WITH BASKET  6812,7517   ureteral stone   . MENISCUS REPAIR Left    knee open meniscetomy  . TRANSTHORACIC ECHOCARDIOGRAM  2004   no lvh nl ejection fraction  . TRANSTHORACIC ECHOCARDIOGRAM  09/19/2016   In setting of NSTEMI:  EF 45-50% with diffuse hypokinesis. GR 2 DD. Mild biatrial enlargement.    There were no vitals filed for this visit.  Subjective Assessment - 07/02/18 1249    Subjective  The patient is working on getting into/out of bed with less help form sister in law at home.    Pertinent History  history of CAD with chronic combined CHF, T2DM, morbid obesity, hypertension, PE in the past; venous stasis ulcers.    Patient Stated Goals  Returning to greatest level of independence.  (prior status was short distance walking with 2 canes).    Currently in Pain?  No/denies                       [facility] Adult PT Treatment/Exercise - 07/02/18 1249      Ambulation/Gait   Ambulation/Gait  Yes    Ambulation/Gait Assistance  5: Supervision    Ambulation/Gait Assistance  Details  PT progressing gait speed.    Ambulation Distance (Feet)  75 Feet   50, 40 feet   Assistive device  Rolling walker    Gait Pattern  Decreased stance time - left;Decreased step length - right;Decreased step length - left;Poor foot clearance - left;Poor foot clearance - right;Wide base of support;Trunk flexed;Shuffle    Ambulation Surface  Level;Indoor      Exercises   Exercises  Other Exercises    Other Exercises   SUPINE:  quad set with 5 second holds x 5 reps each side, heel slide using sliding board x 5 reps each side.  PROM knee to chest for hip and knee stretching.  Glut set supine x 5 reps.  Thomas test stretch x 30 second holds  x 2 reps each leg.                PT Short Term Goals - 06/30/18 1208      PT SHORT TERM GOAL #1   Title  The patient will return demo HEP emphasizing L knee flexion (chronic injury- may be able to get more flexion to use in functional transfers?)/extension, bilat LE strength, standing balance, and general conditioning. (STGs due 06/25/2018)    Baseline  PT working     Time  4    Period  Weeks    Status  Partially Met      PT SHORT TERM GOAL #2   Title  The patient will move sit<>stand mod indep to barriatric RW.    Time  4    Period  Weeks    Status  Achieved      PT SHORT TERM GOAL #3   Title  The patient will move sit<>supine mod indep to be able to get into/out of hospital bed in home.    Time  4    Period  Weeks    Status  Partially Met      PT SHORT TERM GOAL #4   Title  The patient will ambulate 100 ft with RW and close supervision to improve household ambulation.    Baseline  Patient ambulates 115 ft with RW, supervision and one rest break.    Time  4    Period  Weeks    Status  Partially Met      PT SHORT TERM GOAL #5   Title  The patient will be further assessed for gait speed and stairs when able.    Baseline  Gait speed=0.37 ft/sec with RW.  Stairs not safe at this time.    Time  4    Period  Weeks    Status  Partially Met        PT Long Term Goals - 05/26/18 1747      PT LONG TERM GOAL #1   Title  The patient will be indep with progression of HEP for post d/c strengthening and mobility. (LTG due date 08/24/2018)    Time  12    Period  Weeks    Target Date  08/24/18      PT LONG TERM GOAL #2   Title  The patient will be indep with bed mobility in standard bed (not relying on hospital bed).    Time  12    Period  Weeks    Target Date  08/24/18      PT LONG TERM GOAL #3   Title  The patient will ambulate x 400 ft with RW mod indep for household and short  distance community mobility.    Time  12    Period  Weeks    Target Date  08/24/18       PT LONG TERM GOAL #4   Title  Further gait speed goal to follow.    Time  12    Period  Weeks    Target Date  08/24/18      PT LONG TERM GOAL #5   Title  Stair goal to follow, when appropriate    Time  12    Period  Weeks    Target Date  08/24/18            Plan - 07/02/18 1335    Clinical Impression Statement  The patient is tolerating increased walking this week and PT is continuing to progress stretching and LE strengthening to improve mobility.    PT Treatment/Interventions  ADLs/Self Care Home Management;Balance training;Neuromuscular re-education;Patient/family education;Gait training;Stair training;Functional mobility training;DME Instruction;Therapeutic activities;Therapeutic exercise    PT Next Visit Plan  Add HEP as appropriate for LE strengthening, Gait to increase distnace, sci-fit, knee /hip/ gastroc stretching.    Consulted and Agree with Plan of Care  Patient       Patient will benefit from skilled therapeutic intervention in order to improve the following deficits and impairments:  Abnormal gait, Impaired sensation, Obesity, Decreased activity tolerance, Decreased balance, Decreased strength, Pain, Decreased mobility, Difficulty walking, Decreased range of motion, Impaired flexibility, Postural dysfunction  Visit Diagnosis: Muscle weakness (generalized)  Unsteadiness on feet  Other abnormalities of gait and mobility     Problem List Patient Active Problem List   Diagnosis Date Noted  . Recurrent pulmonary embolism ([place])   . Pain of left heel   . C4 spinal cord injury, sequela ([place]) 12/18/2017  . Tetraplegia ([place]) 12/18/2017  . Aortic aneurysm ([place]) 12/16/2017  . Cord compression ([place]) 12/13/2017  . Chronic combined systolic and diastolic heart failure ([place]) 10/09/2016  . CAD S/P percutaneous coronary angioplasty 09/20/2016  . Erectile dysfunction 07/30/2015  . Morbid obesity with BMI of 50.0-59.9, adult ([place]) 07/18/2013  . History of pulmonary  embolism 07/13/2013  . Angioedema of lips 07/13/2013  . Multinodular goiter 07/13/2013  . Dyslipidemia, goal LDL below 70 10/02/2011  . SOB (shortness of breath) 10/02/2011  . BPH with urinary obstruction 07/17/2010  . Diabetes mellitus type 2, controlled ([place]) 07/17/2010  . Bilateral lower extremity edema 12/08/2008  . NEUROPATHY, IDIOPATHIC PERIPHERAL NEC 05/31/2007  . Osteoarthritis 03/22/2007  . Gout 03/15/2007  . Essential hypertension 03/15/2007    Lebert Lovern, PT 07/02/2018, 1:36 PM  [facility] [address] [place], [place], [address] Phone: [phone]   Fax:  [phone]  Name: Franklin Hicks MRN: [id number] Date of Birth: 09-05-1950

## 2018-07-07 ENCOUNTER — Ambulatory Visit: Payer: Medicare Other | Admitting: Rehabilitative and Restorative Service Providers"

## 2018-07-07 ENCOUNTER — Ambulatory Visit: Payer: Medicare Other | Admitting: Occupational Therapy

## 2018-07-07 ENCOUNTER — Encounter: Payer: Self-pay | Admitting: Rehabilitative and Restorative Service Providers"

## 2018-07-07 DIAGNOSIS — M6281 Muscle weakness (generalized): Secondary | ICD-10-CM | POA: Diagnosis not present

## 2018-07-07 DIAGNOSIS — M25611 Stiffness of right shoulder, not elsewhere classified: Secondary | ICD-10-CM

## 2018-07-07 DIAGNOSIS — R2681 Unsteadiness on feet: Secondary | ICD-10-CM

## 2018-07-07 DIAGNOSIS — M25612 Stiffness of left shoulder, not elsewhere classified: Secondary | ICD-10-CM

## 2018-07-07 NOTE — Therapy (Signed)
[facility] [address] [place], [place], [address] Phone: [phone]   Fax:  [phone]  Occupational Therapy Treatment  Patient Details  Name: Franklin Hicks MRN: [id number] Date of Birth: [birth year] Referring Provider (OT): Vickey Sages Date: 07/07/2018  OT End of Session - 07/07/18 9702    Visit Number  7    Number of Visits  17    Date for OT Re-Evaluation  08/06/18    Authorization Type  Medicare    Authorization - Visit Number  7    Authorization - Number of Visits  10    OT Start Time  6378    OT Stop Time  1325    OT Time Calculation (min)  50 min    Activity Tolerance  Patient tolerated treatment well    Behavior During Therapy  [facility] for tasks assessed/performed       Past Medical History:  Diagnosis Date  . Arthritis    "knees" (12/17/2017)  . CAD S/P percutaneous coronary angioplasty 09/20/2016   Nstemi 08/2016. Dr. Doylene Canard. DES- brilinta and asa. Requests change to [facility] cardiology; 95% Ramus -> PCI Resolute Onyx DES 2.75 x 18  . Central cord syndrome ([place]) 12/17/2017  . Chronic combined systolic and diastolic heart failure ([place]) 10/09/2016   EF 45% and grade II diastolic after nstemi  . Diet-controlled diabetes mellitus ([place])   . DJD (degenerative joint disease)   . Gout   . High cholesterol   . Hypertension   . NSTEMI (non-ST elevated myocardial infarction) ([place]) 09/20/2016   95% Ramus - > PCI   . Obesity   . Recurrent pulmonary embolism ([place]) 11/'14; 4/'19   a) Bilateral segmental and subsegmental pulmonary emboli with mild RV Strain.; b) after fall with C-spine Fxr --> Acute PE of right main pulmonary artery extending into multiple segments.    Past Surgical History:  Procedure Laterality Date  . CARDIAC CATHETERIZATION N/A 09/19/2016   Procedure: Left Heart Cath and Coronary Angiography;  Surgeon: Dixie Dials, MD;  Location: [facility];  Service: Cardiovascular: 95% proximal  Ramus Intermedius --> PCI  . CARDIAC CATHETERIZATION N/A 09/19/2016   Procedure: Coronary Stent Intervention;  Surgeon: Nelva Bush, MD;  Location: [facility];  Service: Cardiovascular: 95% ramus intermedius;  Resolute Onyx 2.75 x 18 mm drug-eluting stent  . CYSTOSCOPY/RETROGRADE/URETEROSCOPY/STONE EXTRACTION WITH BASKET  [id number],0277   ureteral stone   . MENISCUS REPAIR Left    knee open meniscetomy  . TRANSTHORACIC ECHOCARDIOGRAM  2004   no lvh nl ejection fraction  . TRANSTHORACIC ECHOCARDIOGRAM  09/19/2016   In setting of NSTEMI:  EF 45-50% with diffuse hypokinesis. GR 2 DD. Mild biatrial enlargement.    There were no vitals filed for this visit.  Subjective Assessment - 07/07/18 1436    Subjective   I still have trouble wiping (re: toilet hygiene)     Pertinent History  C4 SCI tetraplegia    Currently in Pain?  --   Lt knee - O.T. not addressing directly      Discussed options for increased ease and thoroughness with toilet hygiene including use of toilet aid and wet wipes prn. Also discussed standing position to allow better positioning for wiping.   Sit to supine I'ly. Supine: began with scapula depression ex's (issued as HEP), followed by bilateral shoulder flexion with emphasis on scapula depression and upward rotation to avoid sh. hiking with tactile and visual cues given. Pt also instructed to  exhale going up as pt tends to hold breathe in, which also facilitates sh hiking. Pt issued as HEP  Supine to sit with min assist. Low level BUE reaching with same cueing to facilitate above normal movement pattern.  Standing: worked on standing upright w/ pelvic activation/control followed by disengaging RUE, then LUE in prep for functional reaching in standing.                     OT Education - 07/07/18 1316    Education Details  HEP for scapula depression, and shoulder flexion    Person(s) Educated  Patient    Methods  Explanation;Demonstration;Tactile  cues;Verbal cues    Comprehension  Verbalized understanding;Returned demonstration       OT Short Term Goals - 07/07/18 1438      OT SHORT TERM GOAL #1   Title  Patient will complete a home exercise program designed to improve AROM in Bilateral shoulders with min assist (due 07/07/18)    Status  Achieved      OT SHORT TERM GOAL #2   Title  Patient will complete a stand step transfer to commode with mod assist    Status  Achieved      OT SHORT TERM GOAL #3   Title  Patient will demonstrate static stand with min assist with upper extremities out of support for 1 minute    Status  Achieved      OT SHORT TERM GOAL #4   Title  Patient will transition from supine to sit with mod assist in preparation for return to regular (vs hospital) bed    Status  Achieved      OT SHORT TERM GOAL #5   Title  Patient will begin graduated driving program - first will attempt to operate car in empty lot with another adult driver.      Status  Achieved   pt is currently driving       OT Long Term Goals - 06/15/18 [place] #1   Title  Patient will complete an upgraded HEP designed to improve BUE strength and functional range of motion (due 08/06/18)    Status  On-going      OT LONG TERM GOAL #2   Title  Patient will be modified independnet with toilet hygiene    Status  On-going      OT LONG TERM GOAL #3   Title  Patient will complete shower using tub transfer bench and min assist    Status  On-going      OT LONG TERM GOAL #4   Title  Patient will demonstrate adequate dynamic stand balance tolerance (5 min) to assist with simple cooking or grilling task    Status  On-going      OT LONG TERM GOAL #5   Title  Patient will demonstrate ability to transition from sit to supine, supine to sit, and roll himself in regular bed in preparation for d/c HOSPITAL BED.      Status  On-going      OT LONG TERM GOAL #6   Title  Patient will return to driving    Status  On-going             Plan - 07/07/18 1439    Clinical Impression Statement  Pt has met all STG's. Pt progressing with mobility and decreased compensations and pain shoulders.     Occupational Profile and client history currently impacting  functional performance  Husband, father, grandfather, retired Chief Technology Officer, enjoys watching sports, water exercise class    Occupational performance deficits (Please refer to evaluation for details):  ADL's;IADL's;Rest and Sleep;Leisure    Rehab Potential  Good    Current Impairments/barriers affecting progress:  obesity    OT Frequency  2x / week    OT Duration  8 weeks    OT Treatment/Interventions  Self-care/ADL training;Aquatic Therapy;Therapeutic exercise;Functional Mobility Training;Balance training;Manual Therapy;Neuromuscular education;Therapeutic activities;Energy conservation;DME and/or AE instruction;Moist Heat;Cryotherapy;Patient/family education    Plan  functional ambulation, ADLS    Consulted and Agree with Plan of Care  Patient       Patient will benefit from skilled therapeutic intervention in order to improve the following deficits and impairments:  Decreased skin integrity, Decreased knowledge of precautions, Improper body mechanics, Decreased activity tolerance, Decreased knowledge of use of DME, Decreased strength, Impaired flexibility, Decreased balance, Decreased mobility, Difficulty walking, Impaired sensation, Obesity, Decreased range of motion, Increased edema, Pain, Decreased coordination, Impaired UE functional use  Visit Diagnosis: Muscle weakness (generalized)  Unsteadiness on feet  Stiffness of right shoulder, not elsewhere classified  Stiffness of left shoulder, not elsewhere classified    Problem List Patient Active Problem List   Diagnosis Date Noted  . Recurrent pulmonary embolism ([place])   . Pain of left heel   . C4 spinal cord injury, sequela ([place]) 12/18/2017  . Tetraplegia ([place]) 12/18/2017  . Aortic aneurysm ([place])  12/16/2017  . Cord compression ([place]) 12/13/2017  . Chronic combined systolic and diastolic heart failure ([place]) 10/09/2016  . CAD S/P percutaneous coronary angioplasty 09/20/2016  . Erectile dysfunction 07/30/2015  . Morbid obesity with BMI of 50.0-59.9, adult ([place]) 07/18/2013  . History of pulmonary embolism 07/13/2013  . Angioedema of lips 07/13/2013  . Multinodular goiter 07/13/2013  . Dyslipidemia, goal LDL below 70 10/02/2011  . SOB (shortness of breath) 10/02/2011  . BPH with urinary obstruction 07/17/2010  . Diabetes mellitus type 2, controlled ([place]) 07/17/2010  . Bilateral lower extremity edema 12/08/2008  . NEUROPATHY, IDIOPATHIC PERIPHERAL NEC 05/31/2007  . Osteoarthritis 03/22/2007  . Gout 03/15/2007  . Essential hypertension 03/15/2007    Carey Bullocks, OTR/L 07/07/2018, 2:41 PM  [facility] [address] [place], [place], [address] Phone: [phone]   Fax:  [phone]  Name: Franklin Hicks MRN: [id number] Date of Birth: 01-Mar-1951

## 2018-07-07 NOTE — Patient Instructions (Signed)
  1. Laying down on back - slide hands towards feet and hold 3 seconds, then relax. Repeat 10 times, 2x/day. You should feel the shoulder blades going down towards feet, but do not move too much where shoulders come up.   2. Holding the paper towel roll with both hands, palms facing each other, begin by pressing shoulder blades down to bed, then start to raise arms and shoulder blades go down towards feet. Breathe out as you raise your arms, then lower slowly. Repeat 10 times, at least 2x/day

## 2018-07-07 NOTE — Therapy (Signed)
[facility] [address] [place], [place], [address] Phone: [phone]   Fax:  [phone]  Physical Therapy Treatment  Patient Details  Name: Franklin Hicks MRN: [id number] Date of Birth: 1951-03-31 Referring Provider (PT): Faith Rogue, MD   Encounter Date: 07/07/2018  PT End of Session - 07/07/18 1427    Visit Number  9    Number of Visits  25    Date for PT Re-Evaluation  08/24/18    Authorization Type  $20 copay [employer] medicare    PT Start Time  1145    PT Stop Time  1235    PT Time Calculation (min)  50 min    Activity Tolerance  Patient tolerated treatment well    Behavior During Therapy  [facility] for tasks assessed/performed       Past Medical History:  Diagnosis Date  . Arthritis    "knees" (12/17/2017)  . CAD S/P percutaneous coronary angioplasty 09/20/2016   Nstemi 08/2016. Dr. Algie Coffer. DES- brilinta and asa. Requests change to [facility] cardiology; 95% Ramus -> PCI Resolute Onyx DES 2.75 x 18  . Central cord syndrome ([facility]) 12/17/2017  . Chronic combined systolic and diastolic heart failure ([facility]) 10/09/2016   EF 45% and grade II diastolic after nstemi  . Diet-controlled diabetes mellitus ([facility])   . DJD (degenerative joint disease)   . Gout   . High cholesterol   . Hypertension   . NSTEMI (non-ST elevated myocardial infarction) ([facility]) 09/20/2016   95% Ramus - > PCI   . Obesity   . Recurrent pulmonary embolism ([facility]) 11/'14; 4/'19   a) Bilateral segmental and subsegmental pulmonary emboli with mild RV Strain.; b) after fall with C-spine Fxr --> Acute PE of right main pulmonary artery extending into multiple segments.    Past Surgical History:  Procedure Laterality Date  . CARDIAC CATHETERIZATION N/A 09/19/2016   Procedure: Left Heart Cath and Coronary Angiography;  Surgeon: Orpah Cobb, MD;  Location: [facility] INVASIVE CV LAB;  Service: Cardiovascular: 95% proximal Ramus Intermedius --> PCI  . CARDIAC CATHETERIZATION N/A  09/19/2016   Procedure: Coronary Stent Intervention;  Surgeon: Yvonne Kendall, MD;  Location: [facility] INVASIVE CV LAB;  Service: Cardiovascular: 95% ramus intermedius;  Resolute Onyx 2.75 x 18 mm drug-eluting stent  . CYSTOSCOPY/RETROGRADE/URETEROSCOPY/STONE EXTRACTION WITH BASKET  2536,6440   ureteral stone   . MENISCUS REPAIR Left    knee open meniscetomy  . TRANSTHORACIC ECHOCARDIOGRAM  2004   no lvh nl ejection fraction  . TRANSTHORACIC ECHOCARDIOGRAM  09/19/2016   In setting of NSTEMI:  EF 45-50% with diffuse hypokinesis. GR 2 DD. Mild biatrial enlargement.    There were no vitals filed for this visit.  Subjective Assessment - 07/07/18 1150    Subjective  Transfers are easier.  Still has not transferred to a regular chair.  He returned to driving and drove to the clinic today.     Pertinent History  history of CAD with chronic combined CHF, T2DM, morbid obesity, hypertension, PE in the past; venous stasis ulcers.    Patient Stated Goals  Returning to greatest level of independence.  (prior status was short distance walking with 2 canes).    Currently in Pain?  Yes   minor aches                      [facility] Adult PT Treatment/Exercise - 07/07/18 1222      Transfers   Transfers  Sit to Stand;Stand to Sit  Sit to Stand  6: Modified independent (Device/Increase time)    Stand to Sit  6: Modified independent (Device/Increase time)      Ambulation/Gait   Ambulation/Gait  Yes    Ambulation/Gait Assistance  5: Supervision    Ambulation Distance (Feet)  50 Feet    Assistive device  Rolling walker    Gait Pattern  Decreased stance time - left;Decreased step length - right;Decreased step length - left;Poor foot clearance - left;Poor foot clearance - right;Wide base of support;Trunk flexed;Shuffle    Ambulation Surface  Level;Indoor      Exercises   Exercises  Other Exercises    Other Exercises   SUPINE:  knee to chest with PROM for overpressure in to flexion and extension  x 5 reps each side; SAQ over bolster x 5 reps with manual resistance.  SIDELYING:  hip abduction clam shels x 5 reps each side, hip abduction x 5 reps with assist from therapist, PNF pattern for LE diagonal.    PRONE:  with glut sets x 5 reps, prone on elbows x 1 minute and hamstring/knee flexion with PT providing overpressure and assist.               PT Short Term Goals - 07/07/18 1200      PT SHORT TERM GOAL #1   Title  The patient will be indep with progression of HEP.    Time  4    Period  Weeks    Status  New    Target Date  07/25/18      PT SHORT TERM GOAL #2   Title  The patient will ambulate in the home with RW mod indep with short distances (x 50 ft) per report.    Time  4    Period  Weeks    Status  New    Target Date  07/25/18      PT SHORT TERM GOAL #3   Title  The patient will be able to return to sitting on living room furniture and moving sit>stand to RW from his chair in home.    Time  4    Period  Weeks    Status  New    Target Date  07/25/18      PT SHORT TERM GOAL #4   Title  The patient will ambulate x 200 ft nonstop with RW and supervision.    Time  4    Period  Weeks    Status  New    Target Date  07/25/18      PT SHORT TERM GOAL #5   Title  The patient will imrpove gait speed from 0.37 ft/sec to > or equal to 0.6 ft/sec to demo improving functional mobility.    Time  4    Period  Weeks    Status  New    Target Date  07/25/18        PT Long Term Goals - 05/26/18 1747      PT LONG TERM GOAL #1   Title  The patient will be indep with progression of HEP for post d/c strengthening and mobility. (LTG due date 08/24/2018)    Time  12    Period  Weeks    Target Date  08/24/18      PT LONG TERM GOAL #2   Title  The patient will be indep with bed mobility in standard bed (not relying on hospital bed).    Time  12  Period  Weeks    Target Date  08/24/18      PT LONG TERM GOAL #3   Title  The patient will ambulate x 400 ft with RW mod  indep for household and short distance community mobility.    Time  12    Period  Weeks    Target Date  08/24/18      PT LONG TERM GOAL #4   Title  Further gait speed goal to follow.    Time  12    Period  Weeks    Target Date  08/24/18      PT LONG TERM GOAL #5   Title  Stair goal to follow, when appropriate    Time  12    Period  Weeks    Target Date  08/24/18            Plan - 07/07/18 1435    Clinical Impression Statement  The patient is progressing with exercise tolerance in therapy and this carries over to standing more upright after stretching today.     PT Treatment/Interventions  ADLs/Self Care Home Management;Balance training;Neuromuscular re-education;Patient/family education;Gait training;Stair training;Functional mobility training;DME Instruction;Therapeutic activities;Therapeutic exercise    PT Next Visit Plan  Increase HEP., Gait to increase distnace, sci-fit, knee /hip/ gastroc stretching.    Consulted and Agree with Plan of Care  Patient       Patient will benefit from skilled therapeutic intervention in order to improve the following deficits and impairments:  Abnormal gait, Impaired sensation, Obesity, Decreased activity tolerance, Decreased balance, Decreased strength, Pain, Decreased mobility, Difficulty walking, Decreased range of motion, Impaired flexibility, Postural dysfunction  Visit Diagnosis: Muscle weakness (generalized)  Unsteadiness on feet     Problem List Patient Active Problem List   Diagnosis Date Noted  . Recurrent pulmonary embolism (HCC)   . Pain of left heel   . C4 spinal cord injury, sequela (HCC) 12/18/2017  . Tetraplegia (HCC) 12/18/2017  . Aortic aneurysm (H[facility]) 12/16/2017  . Cord compression ([facility]) 12/13/2017  . Chronic combined systolic and diastolic heart failure (H[facility]) 10/09/2016  . CAD S/P percutaneous coronary angioplasty 09/20/2016  . Erectile dysfunction 07/30/2015  . Morbid obesity with BMI of 50.0-59.9, adult  (HCC) 07/18/2013  . History of pulmonary embolism 07/13/2013  . Angioedema of lips 07/13/2013  . Multinodular goiter 07/13/2013  . Dyslipidemia, goal LDL below 70 10/02/2011  . SOB (shortness of breath) 10/02/2011  . BPH with urinary obstruction 07/17/2010  . Diabetes mellitus type 2, controlled (HCC) 07/17/2010  . Bilateral lower extremity edema 12/08/2008  . NEUROPATHY, IDIOPATHIC PERIPHERAL NEC 05/31/2007  . Osteoarthritis 03/22/2007  . Gout 03/15/2007  . Essential hypertension 03/15/2007    Franklin Hicks, PT 07/07/2018, 2:41 PM  [facility] [address] [place], [place], [address] Phone: [phone]   Fax:  [phone]  Name: Franklin Hicks MRN: [id number] Date of Birth: 06-05-1951

## 2018-07-09 ENCOUNTER — Ambulatory Visit: Payer: Medicare Other | Admitting: Rehabilitative and Restorative Service Providers"

## 2018-07-09 ENCOUNTER — Ambulatory Visit: Payer: Medicare Other | Admitting: Occupational Therapy

## 2018-07-13 ENCOUNTER — Encounter: Payer: Medicare Other | Attending: Physical Medicine & Rehabilitation | Admitting: Registered Nurse

## 2018-07-13 ENCOUNTER — Encounter: Payer: Self-pay | Admitting: Registered Nurse

## 2018-07-13 ENCOUNTER — Other Ambulatory Visit: Payer: Self-pay

## 2018-07-13 ENCOUNTER — Encounter: Payer: Medicare Other | Admitting: Physical Medicine & Rehabilitation

## 2018-07-13 VITALS — BP 145/82 | HR 61

## 2018-07-13 DIAGNOSIS — N400 Enlarged prostate without lower urinary tract symptoms: Secondary | ICD-10-CM | POA: Diagnosis not present

## 2018-07-13 DIAGNOSIS — Z87891 Personal history of nicotine dependence: Secondary | ICD-10-CM | POA: Insufficient documentation

## 2018-07-13 DIAGNOSIS — G8250 Quadriplegia, unspecified: Secondary | ICD-10-CM | POA: Diagnosis not present

## 2018-07-13 DIAGNOSIS — Z5181 Encounter for therapeutic drug level monitoring: Secondary | ICD-10-CM | POA: Diagnosis not present

## 2018-07-13 DIAGNOSIS — I110 Hypertensive heart disease with heart failure: Secondary | ICD-10-CM | POA: Insufficient documentation

## 2018-07-13 DIAGNOSIS — M170 Bilateral primary osteoarthritis of knee: Secondary | ICD-10-CM

## 2018-07-13 DIAGNOSIS — Z86711 Personal history of pulmonary embolism: Secondary | ICD-10-CM | POA: Insufficient documentation

## 2018-07-13 DIAGNOSIS — E7800 Pure hypercholesterolemia, unspecified: Secondary | ICD-10-CM | POA: Insufficient documentation

## 2018-07-13 DIAGNOSIS — I2510 Atherosclerotic heart disease of native coronary artery without angina pectoris: Secondary | ICD-10-CM | POA: Insufficient documentation

## 2018-07-13 DIAGNOSIS — M109 Gout, unspecified: Secondary | ICD-10-CM | POA: Diagnosis not present

## 2018-07-13 DIAGNOSIS — M1710 Unilateral primary osteoarthritis, unspecified knee: Secondary | ICD-10-CM | POA: Insufficient documentation

## 2018-07-13 DIAGNOSIS — S14104A Unspecified injury at C4 level of cervical spinal cord, initial encounter: Secondary | ICD-10-CM | POA: Diagnosis present

## 2018-07-13 DIAGNOSIS — I5042 Chronic combined systolic (congestive) and diastolic (congestive) heart failure: Secondary | ICD-10-CM | POA: Diagnosis not present

## 2018-07-13 DIAGNOSIS — M542 Cervicalgia: Secondary | ICD-10-CM | POA: Insufficient documentation

## 2018-07-13 DIAGNOSIS — Z6841 Body Mass Index (BMI) 40.0 and over, adult: Secondary | ICD-10-CM | POA: Diagnosis not present

## 2018-07-13 DIAGNOSIS — N319 Neuromuscular dysfunction of bladder, unspecified: Secondary | ICD-10-CM | POA: Diagnosis not present

## 2018-07-13 DIAGNOSIS — Z7901 Long term (current) use of anticoagulants: Secondary | ICD-10-CM | POA: Diagnosis not present

## 2018-07-13 DIAGNOSIS — Z7902 Long term (current) use of antithrombotics/antiplatelets: Secondary | ICD-10-CM | POA: Insufficient documentation

## 2018-07-13 DIAGNOSIS — I252 Old myocardial infarction: Secondary | ICD-10-CM | POA: Diagnosis not present

## 2018-07-13 DIAGNOSIS — E1140 Type 2 diabetes mellitus with diabetic neuropathy, unspecified: Secondary | ICD-10-CM | POA: Diagnosis not present

## 2018-07-13 DIAGNOSIS — S14104S Unspecified injury at C4 level of cervical spinal cord, sequela: Secondary | ICD-10-CM

## 2018-07-13 NOTE — Progress Notes (Signed)
Subjective:    Patient ID: Franklin Hicks Mings, male    DOB: 08/06/1951, 67 y.o.   MRN: [id number]  HPI: [name] Franklin Hicks is a 67 year old male who is here for follow up appointment of his cervical SCI. He denies any pain at this time. He rates his pain 0. His current exercise regime is attending Physical Therapy twice a week.  Also reports he is driving and has followed Dr. Riley KillSwartz instructions.  [name] expressed concerned regarding opening on his left lower extremity, skin assessment performed no open area noted. He was instructed to monitor and call office with any concerns. He verbalizes understanding.    Pain Inventory Average Pain 2 Pain Right Now 0 My pain is na  In the last 24 hours, has pain interfered with the following? General activity 0 Relation with others 0 Enjoyment of life 0 What TIME of day is your pain at its worst? na Sleep (in general) NA  Pain is worse with: standing Pain improves with: na Relief from Meds: na  Mobility walk with assistance use a cane how many minutes can you walk? 4 ability to climb steps?  no do you drive?  yes  Function retired I need assistance with the following:  bathing and meal prep  Neuro/Psych No problems in this area  Prior Studies Any changes since last visit?  no  Physicians involved in your care Any changes since last visit?  no   Family History  Problem Relation Age of Onset  . Liver disease Mother    Social History   Socioeconomic History  . Marital status: Married    Spouse name: Not on file  . Number of children: Not on file  . Years of education: Not on file  . Highest education level: Not on file  Occupational History  . Occupation: Runner, broadcasting/film/videoteacher  Social Needs  . Financial resource strain: Not on file  . Food insecurity:    Worry: Not on file    Inability: Not on file  . Transportation needs:    Medical: Not on file    Non-medical: Not on file  Tobacco Use  . Smoking status: Former Smoker   Packs/day: 1.00    Years: 13.00    Pack years: 13.00    Types: Cigarettes    Last attempt to quit: 07/13/1977    Years since quitting: 41.0  . Smokeless tobacco: Never Used  Substance and Sexual Activity  . Alcohol use: Yes    Alcohol/week: 0.0 standard drinks    Comment: holidays only  . Drug use: No  . Sexual activity: Not on file  Lifestyle  . Physical activity:    Days per week: Not on file    Minutes per session: Not on file  . Stress: Not on file  Relationships  . Social connections:    Talks on phone: Not on file    Gets together: Not on file    Attends religious service: Not on file    Active member of club or organization: Not on file    Attends meetings of clubs or organizations: Not on file    Relationship status: Not on file  Other Topics Concern  . Not on file  Social History Narrative   Married for 44 years. He has 3 children and 5 grandchildren. They also 5 great grandchildren. He lives with his wife. 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 -> [employer]   Occasional beer or wine  No smoking history or illicit drug use.   One hour water aerobics sessions 3 days a week. Has not started back yet read from recent illness sounds.   Past Surgical History:  Procedure Laterality Date  . CARDIAC CATHETERIZATION N/A 09/19/2016   Procedure: Left Heart Cath and Coronary Angiography;  Surgeon: Orpah Cobb, MD;  Location: [facility] INVASIVE CV LAB;  Service: Cardiovascular: 95% proximal Ramus Intermedius --> PCI  . CARDIAC CATHETERIZATION N/A 09/19/2016   Procedure: Coronary Stent Intervention;  Surgeon: Yvonne Kendall, MD;  Location: [facility] INVASIVE CV LAB;  Service: Cardiovascular: 95% ramus intermedius;  Resolute Onyx 2.75 x 18 mm drug-eluting stent  . CYSTOSCOPY/RETROGRADE/URETEROSCOPY/STONE EXTRACTION WITH BASKET  1610,9604   ureteral stone   . MENISCUS REPAIR Left    knee open meniscetomy  . TRANSTHORACIC ECHOCARDIOGRAM  2004   no lvh nl  ejection fraction  . TRANSTHORACIC ECHOCARDIOGRAM  09/19/2016   In setting of NSTEMI:  EF 45-50% with diffuse hypokinesis. GR 2 DD. Mild biatrial enlargement.   Past Medical History:  Diagnosis Date  . Arthritis    "knees" (12/17/2017)  . CAD S/P percutaneous coronary angioplasty 09/20/2016   Nstemi 08/2016. Dr. Algie Coffer. DES- brilinta and asa. Requests change to [facility] cardiology; 95% Ramus -> PCI Resolute Onyx DES 2.75 x 18  . Central cord syndrome (HCC) 12/17/2017  . Chronic combined systolic and diastolic heart failure (HCC) 10/09/2016   EF 45% and grade II diastolic after nstemi  . Diet-controlled diabetes mellitus (HCC)   . DJD (degenerative joint disease)   . Gout   . High cholesterol   . Hypertension   . NSTEMI (non-ST elevated myocardial infarction) (HCC) 09/20/2016   95% Ramus - > PCI   . Obesity   . Recurrent pulmonary embolism (HCC) 11/'14; 4/'19   a) Bilateral segmental and subsegmental pulmonary emboli with mild RV Strain.; b) after fall with C-spine Fxr --> Acute PE of right main pulmonary artery extending into multiple segments.   BP (!) 145/82   Pulse 61   SpO2 97%   Opioid Risk Score:   Fall Risk Score:  `1  Depression screen PHQ 2/9  Depression screen [facility] 2/9 07/13/2018 07/01/2018 04/12/2018 03/10/2018 12/16/2016 10/09/2016 12/13/2014  Decreased Interest 0 0 0 0 0 0 0  Down, Depressed, Hopeless 0 0 0 0 0 0 0  PHQ - 2 Score 0 0 0 0 0 0 0   Review of Systems  Constitutional: Negative.   HENT: Negative.   Eyes: Negative.   Respiratory: Negative.   Cardiovascular: Negative.   Gastrointestinal: Positive for diarrhea.  Endocrine: Negative.   Genitourinary: Negative.   Musculoskeletal: Positive for gait problem.  Skin: Negative.   Allergic/Immunologic: Negative.   Hematological: Bruises/bleeds easily.  Psychiatric/Behavioral: Negative.   All other systems reviewed and are negative.      Objective:   Physical Exam  Constitutional: He is oriented to person,  place, and time. He appears well-developed and well-nourished.  HENT:  Head: Normocephalic and atraumatic.  Neck: Normal range of motion. Neck supple.  Cardiovascular: Normal rate and regular rhythm.  Pulmonary/Chest: Effort normal and breath sounds normal.  Musculoskeletal:  Normal Muscle Bulk and Muscle Testing Reveals: Upper Extremities: Decreased ROM and Muscle Strength 90 Degrees and Muscle Strength 4/5 Lower Extremities: Full ROM and Muscle Strength 5/5 Arrived in wheelchair  Neurological: He is alert and oriented to person, place, and time.  Skin: Skin is warm and dry.  Psychiatric: He has a normal mood and affect. His behavior is  normal.  Nursing note and vitals reviewed.         Assessment & Plan:  1. Functional Deficits Secondary to C4 Spinal Cord Injury: Continue with Physical Therapy.  2. Tetraplegia/ Chronic Pain: Continue Gabapentin  3. Bilateral OA of Bilateral Knees : Continue  Voltaren Gel 3. Morbid Obesity: Continue with Healthy Diet Regimen  30 minutes of face to face patient care time was spent during this visit. All questions was encouraged and answered.   F/U in 3 months with Dr Riley Kill

## 2018-07-14 ENCOUNTER — Ambulatory Visit: Payer: Medicare Other | Admitting: Physical Therapy

## 2018-07-14 ENCOUNTER — Encounter: Payer: Self-pay | Admitting: Physical Therapy

## 2018-07-14 ENCOUNTER — Ambulatory Visit: Payer: Medicare Other | Admitting: Occupational Therapy

## 2018-07-14 DIAGNOSIS — M6281 Muscle weakness (generalized): Secondary | ICD-10-CM

## 2018-07-14 DIAGNOSIS — M25612 Stiffness of left shoulder, not elsewhere classified: Secondary | ICD-10-CM

## 2018-07-14 DIAGNOSIS — R2681 Unsteadiness on feet: Secondary | ICD-10-CM

## 2018-07-14 DIAGNOSIS — M25611 Stiffness of right shoulder, not elsewhere classified: Secondary | ICD-10-CM

## 2018-07-14 NOTE — Therapy (Signed)
[facility] [address] [place], [place], [address] Phone: [phone]   Fax:  [phone]  Occupational Therapy Treatment  Patient Details  Name: Franklin SpareCarl J Hicks MRN: [id number] Date of Birth: 07/19/1951 Referring Provider (OT): Jerrilyn CairoSwartz   Encounter Date: 07/14/2018  OT End of Session - 07/14/18 1429    Visit Number  8    Number of Visits  17    Date for OT Re-Evaluation  08/06/18    Authorization Type  Medicare    Authorization - Visit Number  8    Authorization - Number of Visits  10    OT Start Time  1235    OT Stop Time  1315    OT Time Calculation (min)  40 min    Activity Tolerance  Patient tolerated treatment well    Behavior During Therapy  [facility] for tasks assessed/performed       Past Medical History:  Diagnosis Date  . Arthritis    "knees" (12/17/2017)  . CAD S/P percutaneous coronary angioplasty 09/20/2016   Nstemi 08/2016. Dr. Algie CofferKadakia. DES- brilinta and asa. [facility]; 95% Ramus -> PCI Resolute Onyx DES 2.75 x 18  . Central cord syndrome (HCC) 12/17/2017  . Chronic combined systolic and diastolic heart failure (HCC) 10/09/2016   EF 45% and grade II diastolic after nstemi  . Diet-controlled diabetes mellitus (HCC)   . DJD (degenerative joint disease)   . Gout   . High cholesterol   . Hypertension   . NSTEMI (non-ST elevated myocardial infarction) (HCC) 09/20/2016   95% Ramus - > PCI   . Obesity   . Recurrent pulmonary embolism (HCC) 11/'14; 4/'19   a) Bilateral segmental and subsegmental pulmonary emboli with mild RV Strain.; b) after fall with C-spine Fxr --> Acute PE of right main pulmonary artery extending into multiple segments.    Past Surgical History:  Procedure Laterality Date  . CARDIAC CATHETERIZATION N/A 09/19/2016   Procedure: Left Heart Cath and Coronary Angiography;  Surgeon: Orpah CobbAjay Kadakia, MD;  Location: [facility] INVASIVE CV LAB;  Service: Cardiovascular: 95% proximal  Ramus Intermedius --> PCI  . CARDIAC CATHETERIZATION N/A 09/19/2016   Procedure: Coronary Stent Intervention;  Surgeon: Yvonne Kendallhristopher End, MD;  [facility];  Service: Cardiovascular: 95% ramus intermedius;  Resolute Onyx 2.75 x 18 mm drug-eluting stent  . CYSTOSCOPY/RETROGRADE/URETEROSCOPY/STONE EXTRACTION WITH BASKET  [id number]   ureteral stone   . MENISCUS REPAIR Left    knee open meniscetomy  . TRANSTHORACIC ECHOCARDIOGRAM  2004   no lvh nl ejection fraction  . TRANSTHORACIC ECHOCARDIOGRAM  09/19/2016   In setting of NSTEMI:  EF 45-50% with diffuse hypokinesis. GR 2 DD. Mild biatrial enlargement.    There were no vitals filed for this visit.  Subjective Assessment - 07/14/18 1301    Pertinent History  C4 SCI (central cord) tetraplegia 12/13/17 from fall in tub    Currently in Pain?  No/denies       See below for updates to UE HEP. Pt instructed to continue ex's in supine, but to add theraband ex's for posterior sh strengthening to help maintain sh. Integrity from self propelling w/c.  Standing at counter with 2 hand support for upright posture, anterior pelvic movement and control in prep for dynamic standing. Progressed to 1 hand support while performing ipsilateral reaching RUE, then LUE (alternating hands). Pt required one rest break in sitting for task.  Pt then stood and worked on wt shifts in prep  for stepping outside BOS                   OT Education - 07/14/18 1309    Education Details  Theraband HEP for rows and bilateral shoulder extension     Person(s) Educated  Patient    Methods  Explanation;Demonstration;Handout    Comprehension  Verbalized understanding;Returned demonstration       OT Short Term Goals - 07/07/18 1438      OT SHORT TERM GOAL #1   Title  Patient will complete a home exercise program designed to improve AROM in Bilateral shoulders with min assist (due 07/07/18)    Status  Achieved      OT SHORT TERM GOAL #2   Title   Patient will complete a stand step transfer to commode with mod assist    Status  Achieved      OT SHORT TERM GOAL #3   Title  Patient will demonstrate static stand with min assist with upper extremities out of support for 1 minute    Status  Achieved      OT SHORT TERM GOAL #4   Title  Patient will transition from supine to sit with mod assist in preparation for return to regular (vs hospital) bed    Status  Achieved      OT SHORT TERM GOAL #5   Title  Patient will begin graduated driving program - first will attempt to operate car in empty lot with another adult driver.      Status  Achieved   pt is currently driving       OT Long Term Goals - 06/15/18 1646      OT LONG TERM GOAL #1   Title  Patient will complete an upgraded HEP designed to improve BUE strength and functional range of motion (due 08/06/18)    Status  On-going      OT LONG TERM GOAL #2   Title  Patient will be modified independnet with toilet hygiene    Status  On-going      OT LONG TERM GOAL #3   Title  Patient will complete shower using tub transfer bench and min assist    Status  On-going      OT LONG TERM GOAL #4   Title  Patient will demonstrate adequate dynamic stand balance tolerance (5 min) to assist with simple cooking or grilling task    Status  On-going      OT LONG TERM GOAL #5   Title  Patient will demonstrate ability to transition from sit to supine, supine to sit, and roll himself in regular bed in preparation for d/c HOSPITAL BED.      Status  On-going      OT LONG TERM GOAL #6   Title  Patient will return to driving    Status  On-going            Plan - 07/14/18 1431    Clinical Impression Statement  Pt progressing with standing tolerance. Pt with increased awareness of compensations in shoulders    Occupational Profile and client history currently impacting functional performance  Husband, father, grandfather, retired Air traffic controller, enjoys watching sports, water exercise class     Occupational performance deficits (Please refer to evaluation for details):  ADL's;IADL's;Rest and Sleep;Leisure    Rehab Potential  Good    Current Impairments/barriers affecting progress:  obesity    OT Frequency  2x / week    OT Duration  8 weeks    OT Treatment/Interventions  Self-care/ADL training;Aquatic Therapy;Therapeutic exercise;Functional Mobility Training;Balance training;Manual Therapy;Neuromuscular education;Therapeutic activities;Energy conservation;DME and/or AE instruction;Moist Heat;Cryotherapy;Patient/family education    Plan  continue BUE range and function, functional standing tolerance/balance       Patient will benefit from skilled therapeutic intervention in order to improve the following deficits and impairments:  Decreased skin integrity, Decreased knowledge of precautions, Improper body mechanics, Decreased activity tolerance, Decreased knowledge of use of DME, Decreased strength, Impaired flexibility, Decreased balance, Decreased mobility, Difficulty walking, Impaired sensation, Obesity, Decreased range of motion, Increased edema, Pain, Decreased coordination, Impaired UE functional use  Visit Diagnosis: Muscle weakness (generalized)  Unsteadiness on feet  Stiffness of right shoulder, not elsewhere classified  Stiffness of left shoulder, not elsewhere classified    Problem List Patient Active Problem List   Diagnosis Date Noted  . Recurrent pulmonary embolism (HCC)   . Pain of left heel   . C4 spinal cord injury, sequela (HCC) 12/18/2017  . Tetraplegia (HCC) 12/18/2017  . Aortic aneurysm (HCC) 12/16/2017  . Cord compression (HCC) 12/13/2017  . Chronic combined systolic and diastolic heart failure (HCC) 10/09/2016  . CAD S/P percutaneous coronary angioplasty 09/20/2016  . Erectile dysfunction 07/30/2015  . Morbid obesity with BMI of 50.0-59.9, adult (HCC) 07/18/2013  . History of pulmonary embolism 07/13/2013  . Angioedema of lips 07/13/2013  .  Multinodular goiter 07/13/2013  . Dyslipidemia, goal LDL below 70 10/02/2011  . SOB (shortness of breath) 10/02/2011  . BPH with urinary obstruction 07/17/2010  . Diabetes mellitus type 2, controlled (HCC) 07/17/2010  . Bilateral lower extremity edema 12/08/2008  . NEUROPATHY, IDIOPATHIC PERIPHERAL NEC 05/31/2007  . Osteoarthritis 03/22/2007  . Gout 03/15/2007  . Essential hypertension 03/15/2007    Kelli Churn, OTR/L 07/14/2018, 2:33 PM  [facility] [address] [place], [place], [address] Phone: [phone]   Fax:  [phone]  Name: ISAIAH CIANCI MRN: [id number] Date of Birth: 1951/01/08

## 2018-07-14 NOTE — Patient Instructions (Signed)
1. Scapular Retraction: Bilateral    Facing anchor, pull arms back, bringing shoulder blades together. Repeat _10-15___ times per set. Do _2-3___ sessions per day.  2. Seated: pull theraband back evenly with both arms, keeping elbows STRAIGHT. Repeat 10-15 times, 2-3 times per day

## 2018-07-14 NOTE — Therapy (Addendum)
[facility] [address] [place], [place], [address] Phone: [phone]   Fax:  [phone]  Physical Therapy Treatment and Progress Note  Patient Details  Name: Franklin Hicks MRN: [id number] Date of Birth: 07-06-1951 Referring Provider (PT): Alger Simons, MD   Encounter Date: 07/14/2018    Past Medical History:  Diagnosis Date  . Arthritis    "knees" (12/17/2017)  . CAD S/P percutaneous coronary angioplasty 09/20/2016   Nstemi 08/2016. Dr. Doylene Canard. DES- brilinta and asa. Requests change to [facility] cardiology; 95% Ramus -> PCI Resolute Onyx DES 2.75 x 18  . Central cord syndrome ([place]) 12/17/2017  . Chronic combined systolic and diastolic heart failure ([place]) 10/09/2016   EF 45% and grade II diastolic after nstemi  . Diet-controlled diabetes mellitus ([place])   . DJD (degenerative joint disease)   . Gout   . High cholesterol   . Hypertension   . NSTEMI (non-ST elevated myocardial infarction) ([place]) 09/20/2016   95% Ramus - > PCI   . Obesity   . Recurrent pulmonary embolism ([place]) 11/'14; 4/'19   a) Bilateral segmental and subsegmental pulmonary emboli with mild RV Strain.; b) after fall with C-spine Fxr --> Acute PE of right main pulmonary artery extending into multiple segments.    Past Surgical History:  Procedure Laterality Date  . CARDIAC CATHETERIZATION N/A 09/19/2016   Procedure: Left Heart Cath and Coronary Angiography;  Surgeon: Dixie Dials, MD;  Location: [place] CV [facility];  Service: Cardiovascular: 95% proximal Ramus Intermedius --> PCI  . CARDIAC CATHETERIZATION N/A 09/19/2016   Procedure: Coronary Stent Intervention;  Surgeon: Nelva Bush, MD;  Location: [place];  Service: Cardiovascular: 95% ramus intermedius;  Resolute Onyx 2.75 x 18 mm drug-eluting stent  . CYSTOSCOPY/RETROGRADE/URETEROSCOPY/STONE EXTRACTION WITH BASKET  3785,8850   ureteral stone   . MENISCUS REPAIR Left    knee open  meniscetomy  . TRANSTHORACIC ECHOCARDIOGRAM  2004   no lvh nl ejection fraction  . TRANSTHORACIC ECHOCARDIOGRAM  09/19/2016   In setting of NSTEMI:  EF 45-50% with diffuse hypokinesis. GR 2 DD. Mild biatrial enlargement.    There were no vitals filed for this visit.  Subjective Assessment - 07/14/18 1154    Subjective  Pt reports working on standing at home and other HEP exercises.    Patient is accompained by:  Family member    Pertinent History  history of CAD with chronic combined CHF, T2DM, morbid obesity, hypertension, PE in the past; venous stasis ulcers.    Patient Stated Goals  Returning to greatest level of independence.  (prior status was short distance walking with 2 canes).    Currently in Pain?  No/denies                       [facility] Adult PT Treatment/Exercise - 07/14/18 0001      Transfers   Transfers  Sit to Stand;Stand to Sit    Sit to Stand  6: Modified independent (Device/Increase time)    Stand to Sit  6: Modified independent (Device/Increase time)      Ambulation/Gait   Ambulation/Gait  Yes    Ambulation/Gait Assistance  5: Supervision    Ambulation/Gait Assistance Details  working on posture and endurance, No w/c following for longer walk but multiple standing rest breaks.    Ambulation Distance (Feet)  115 Feet   + 50   Assistive device  Rolling walker    Gait Pattern  Decreased stance time -  left;Decreased step length - right;Decreased step length - left;Poor foot clearance - left;Poor foot clearance - right;Wide base of support;Trunk flexed;Shuffle    Ambulation Surface  Level;Indoor      Exercises   Exercises  Knee/Hip      Knee/Hip Exercises: Stretches   Active Hamstring Stretch  Left;Right;2 reps;30 seconds      Knee/Hip Exercises: Aerobic   Other Aerobic  sci fit stepper, Level 1-2.0, 5 min. all extremities.             PT Education - 07/14/18 1228    Education Details  Discussed progressing walking distance/time for  exercise at home.    Person(s) Educated  Patient    Methods  Explanation    Comprehension  Verbalized understanding       PT Short Term Goals - 07/07/18 1200      PT SHORT TERM GOAL #1   Title  The patient will be indep with progression of HEP.    Time  4    Period  Weeks    Status  New    Target Date  07/25/18      PT SHORT TERM GOAL #2   Title  The patient will ambulate in the home with RW mod indep with short distances (x 50 ft) per report.    Time  4    Period  Weeks    Status  New    Target Date  07/25/18      PT SHORT TERM GOAL #3   Title  The patient will be able to return to sitting on living room furniture and moving sit>stand to [place] from his chair in home.    Time  4    Period  Weeks    Status  New    Target Date  07/25/18      PT SHORT TERM GOAL #4   Title  The patient will ambulate x 200 ft nonstop with RW and supervision.    Time  4    Period  Weeks    Status  New    Target Date  07/25/18      PT SHORT TERM GOAL #5   Title  The patient will imrpove gait speed from 0.37 ft/sec to > or equal to 0.6 ft/sec to demo improving functional mobility.    Time  4    Period  Weeks    Status  New    Target Date  07/25/18        PT Long Term Goals - 05/26/18 1747      PT LONG TERM GOAL #1   Title  The patient will be indep with progression of HEP for post d/c strengthening and mobility. (LTG due date 08/24/2018)    Time  12    Period  Weeks    Target Date  08/24/18      PT LONG TERM GOAL #2   Title  The patient will be indep with bed mobility in standard bed (not relying on hospital bed).    Time  12    Period  Weeks    Target Date  08/24/18      PT LONG TERM GOAL #3   Title  The patient will ambulate x 400 ft with RW mod indep for household and short distance community mobility.    Time  12    Period  Weeks    Target Date  08/24/18      PT LONG TERM GOAL #4  Title  Further gait speed goal to follow.    Time  12    Period  Weeks    Target Date   08/24/18      PT LONG TERM GOAL #5   Title  Stair goal to follow, when appropriate    Time  12    Period  Weeks    Target Date  08/24/18            Plan - 07/14/18 1230    Clinical Impression Statement  Pt progressed walking distance (115') today with RW at supervision level with no chair following.  Address LE strengthening and stretching with seated Sci Fit stepper and hamstring stretches.  Pt reported no pain with activity.    PT Treatment/Interventions  ADLs/Self Care Home Management;Balance training;Neuromuscular re-education;Patient/family education;Gait training;Stair training;Functional mobility training;DME Instruction;Therapeutic activities;Therapeutic exercise    PT Next Visit Plan  Increase HEP., Gait to increase distnace, sci-fit, knee /hip/ gastroc stretching.    Consulted and Agree with Plan of Care  Patient       Patient will benefit from skilled therapeutic intervention in order to improve the following deficits and impairments:  Abnormal gait, Impaired sensation, Obesity, Decreased activity tolerance, Decreased balance, Decreased strength, Pain, Decreased mobility, Difficulty walking, Decreased range of motion, Impaired flexibility, Postural dysfunction  Visit Diagnosis: Muscle weakness (generalized)  Unsteadiness on feet    Physical Therapy Progress Note   Dates of Reporting Period:05/26/2018-07/14/2018   Objective Reports of Subjective Statement:  Patient is improving walking distance at home.  He is able to get into/out of bed on his own.   Objective Measurements: 115 ft with supervision with RW + one rest break, sit<>stand mod indep to barriatric RW, gait=0.37 ft/sec.    Goal Update:  PT Short Term Goals - 06/30/18 1208            PT SHORT TERM GOAL #1   Title  The patient will return demo HEP emphasizing L knee flexion (chronic injury- may be able to get more flexion to use in functional transfers?)/extension, bilat LE strength, standing balance,  and general conditioning. (STGs due 06/25/2018)    Baseline  PT working     Time  4    Period  Weeks    Status  Partially Met        PT SHORT TERM GOAL #2   Title  The patient will move sit<>stand mod indep to barriatric RW.    Time  4    Period  Weeks    Status  Achieved        PT SHORT TERM GOAL #3   Title  The patient will move sit<>supine mod indep to be able to get into/out of hospital bed in home.    Time  4    Period  Weeks    Status  Partially Met        PT SHORT TERM GOAL #4   Title  The patient will ambulate 100 ft with RW and close supervision to improve household ambulation.    Baseline  Patient ambulates 115 ft with RW, supervision and one rest break.    Time  4    Period  Weeks    Status  Partially Met        PT SHORT TERM GOAL #5   Title  The patient will be further assessed for gait speed and stairs when able.    Baseline  Gait speed=0.37 ft/sec with RW.  Stairs not safe at this  time.    Time  4    Period  Weeks    Status  Partially Met       Plan: see above    Reason Skilled Services are Required: functional mobility progression to improve independence.  Thank you for the referral of this patient. Rudell Cobb, MPT    Bjorn Loser, PTA  07/14/18, 12:43 PM [facility] [address] [place], [place], [address] Phone: [phone]   Fax:  [phone]  Name: PERICLES CARMICHEAL MRN: [id number] Date of Birth: 1951-07-02

## 2018-07-16 ENCOUNTER — Ambulatory Visit: Payer: Medicare Other | Admitting: Occupational Therapy

## 2018-07-16 ENCOUNTER — Encounter: Payer: Self-pay | Admitting: Occupational Therapy

## 2018-07-16 ENCOUNTER — Encounter: Payer: Self-pay | Admitting: Rehabilitative and Restorative Service Providers"

## 2018-07-16 ENCOUNTER — Ambulatory Visit: Payer: Medicare Other | Admitting: Rehabilitative and Restorative Service Providers"

## 2018-07-16 DIAGNOSIS — M6281 Muscle weakness (generalized): Secondary | ICD-10-CM

## 2018-07-16 DIAGNOSIS — R2681 Unsteadiness on feet: Secondary | ICD-10-CM

## 2018-07-16 DIAGNOSIS — M25612 Stiffness of left shoulder, not elsewhere classified: Secondary | ICD-10-CM

## 2018-07-16 DIAGNOSIS — M25611 Stiffness of right shoulder, not elsewhere classified: Secondary | ICD-10-CM

## 2018-07-16 DIAGNOSIS — R2689 Other abnormalities of gait and mobility: Secondary | ICD-10-CM

## 2018-07-16 DIAGNOSIS — R208 Other disturbances of skin sensation: Secondary | ICD-10-CM

## 2018-07-16 NOTE — Therapy (Signed)
[facility] [address] [place], [place], [address] Phone: [phone]   Fax:  [phone]  Physical Therapy Treatment  Patient Details  Name: Franklin Hicks MRN: [id number] Date of Birth: [birth year] Referring Provider (PT): Faith Rogue, MD   Encounter Date: 07/16/2018  PT End of Session - 07/16/18 1352    Visit Number  11    Number of Visits  25    Date for PT Re-Evaluation  08/24/18    Authorization Type  $20 copay [employer] medicare    PT Start Time  1235    PT Stop Time  1316    PT Time Calculation (min)  41 min    Activity Tolerance  Patient tolerated treatment well    Behavior During Therapy  [facility] for tasks assessed/performed       Past Medical History:  Diagnosis Date  . Arthritis    "knees" (12/17/2017)  . CAD S/P percutaneous coronary angioplasty 09/20/2016   Nstemi 08/2016. Dr. Algie Coffer. DES- brilinta and asa. Requests change to [facility] cardiology; 95% Ramus -> PCI Resolute Onyx DES 2.75 x 18  . Central cord syndrome ([facility]) 12/17/2017  . Chronic combined systolic and diastolic heart failure ([facility]) 10/09/2016   EF 45% and grade II diastolic after nstemi  . Diet-controlled diabetes mellitus ([facility])   . DJD (degenerative joint disease)   . Gout   . High cholesterol   . Hypertension   . NSTEMI (non-ST elevated myocardial infarction) ([facility]) 09/20/2016   95% Ramus - > PCI   . Obesity   . Recurrent pulmonary embolism ([facility]) 11/'14; 4/'19   a) Bilateral segmental and subsegmental pulmonary emboli with mild RV Strain.; b) after fall with C-spine Fxr --> Acute PE of right main pulmonary artery extending into multiple segments.    Past Surgical History:  Procedure Laterality Date  . CARDIAC CATHETERIZATION N/A 09/19/2016   Procedure: Left Heart Cath and Coronary Angiography;  Surgeon: Orpah Cobb, MD;  Location: [facility] IN[facility];  Service: Cardiovascular: 95% proximal Ramus Intermedius --> PCI  . CARDIAC CATHETERIZATION N/A  09/19/2016   Procedure: Coronary Stent Intervention;  Surgeon: Yvonne Kendall, MD;  Location: [facility] INVASIVE CV LAB;  Service: Cardiovascular: 95% ramus intermedius;  Resolute Onyx 2.75 x 18 mm drug-eluting stent  . CYSTOSCOPY/RETROGRADE/URETEROSCOPY/STONE EXTRACTION WITH BASKET  6962,9528   ureteral stone   . MENISCUS REPAIR Left    knee open meniscetomy  . TRANSTHORACIC ECHOCARDIOGRAM  2004   no lvh nl ejection fraction  . TRANSTHORACIC ECHOCARDIOGRAM  09/19/2016   In setting of NSTEMI:  EF 45-50% with diffuse hypokinesis. GR 2 DD. Mild biatrial enlargement.    There were no vitals filed for this visit.  Subjective Assessment - 07/16/18 1350    Subjective  The patient is walking with w/c nearby at home.    Patient Stated Goals  Returning to greatest level of independence.  (prior status was short distance walking with 2 canes).    Currently in Pain?  No/denies                       [facility] Adult PT Treatment/Exercise - 07/16/18 1245      Transfers   Transfers  Sit to Stand;Stand to Sit    Sit to Stand  6: Modified independent (Device/Increase time)    Stand to Sit  6: Modified independent (Device/Increase time)      Ambulation/Gait   Ambulation/Gait  Yes    Ambulation/Gait Assistance  5:  Supervision    Ambulation/Gait Assistance Details  Emphasizing more upright trunk position and increased heel strike    Ambulation Distance (Feet)  80 Feet    Assistive device  Rolling walker    Gait Pattern  Decreased stance time - left;Decreased step length - right;Decreased step length - left;Poor foot clearance - left;Poor foot clearance - right;Wide base of support;Trunk flexed;Shuffle    Ambulation Surface  Level;Indoor      Exercises   Exercises  Other Exercises    Other Exercises   SUPINE:  bridges x 10 reps with glut activation; PROM knee to chest with overpressure into knee flexion R and L sides.  PRONE:  PROM knee flexion x 30 seconds x 3 reps R and L sides; knee flexion  x 10 reps, glut activation prone x 10 reps each side.  SEATED:  long arc quads x 10 reps, hamstring stretch wtih assist sitting, end range quad contraction x 5 reps R side.             PT Education - 07/16/18 1351    Education Details  recommended chair risers (with OT) for elevating living room chair; recommended walking in home    Person(s) Educated  Patient    Methods  Explanation    Comprehension  Verbalized understanding       PT Short Term Goals - 07/07/18 1200      PT SHORT TERM GOAL #1   Title  The patient will be indep with progression of HEP.    Time  4    Period  Weeks    Status  New    Target Date  07/25/18      PT SHORT TERM GOAL #2   Title  The patient will ambulate in the home with RW mod indep with short distances (x 50 ft) per report.    Time  4    Period  Weeks    Status  New    Target Date  07/25/18      PT SHORT TERM GOAL #3   Title  The patient will be able to return to sitting on living room furniture and moving sit>stand to RW from his chair in home.    Time  4    Period  Weeks    Status  New    Target Date  07/25/18      PT SHORT TERM GOAL #4   Title  The patient will ambulate x 200 ft nonstop with RW and supervision.    Time  4    Period  Weeks    Status  New    Target Date  07/25/18      PT SHORT TERM GOAL #5   Title  The patient will imrpove gait speed from 0.37 ft/sec to > or equal to 0.6 ft/sec to demo improving functional mobility.    Time  4    Period  Weeks    Status  New    Target Date  07/25/18        PT Long Term Goals - 05/26/18 1747      PT LONG TERM GOAL #1   Title  The patient will be indep with progression of HEP for post d/c strengthening and mobility. (LTG due date 08/24/2018)    Time  12    Period  Weeks    Target Date  08/24/18      PT LONG TERM GOAL #2   Title  The patient will be indep  with bed mobility in standard bed (not relying on hospital bed).    Time  12    Period  Weeks    Target Date  08/24/18       PT LONG TERM GOAL #3   Title  The patient will ambulate x 400 ft with RW mod indep for household and short distance community mobility.    Time  12    Period  Weeks    Target Date  08/24/18      PT LONG TERM GOAL #4   Title  Further gait speed goal to follow.    Time  12    Period  Weeks    Target Date  08/24/18      PT LONG TERM GOAL #5   Title  Stair goal to follow, when appropriate    Time  12    Period  Weeks    Target Date  08/24/18            Plan - 07/16/18 1354    Clinical Impression Statement  The patient is continuing to progress with gait activities, upright posture, LE strength and general mobility.  Continue working to [employer] and LTGs emphasizing increased walking/standing in the home.    PT Treatment/Interventions  ADLs/Self Care Home Management;Balance training;Neuromuscular re-education;Patient/family education;Gait training;Stair training;Functional mobility training;DME Instruction;Therapeutic activities;Therapeutic exercise    PT Next Visit Plan  Standing ther ex, Increase HEP., Gait to increase distnace, sci-fit, knee /hip/ gastroc stretching.    Consulted and Agree with Plan of Care  Patient       Patient will benefit from skilled therapeutic intervention in order to improve the following deficits and impairments:  Abnormal gait, Impaired sensation, Obesity, Decreased activity tolerance, Decreased balance, Decreased strength, Pain, Decreased mobility, Difficulty walking, Decreased range of motion, Impaired flexibility, Postural dysfunction  Visit Diagnosis: Muscle weakness (generalized)  Unsteadiness on feet  Other abnormalities of gait and mobility     Problem List Patient Active Problem List   Diagnosis Date Noted  . Recurrent pulmonary embolism ([facility])   . Pain of left heel   . C4 spinal cord injury, sequela ([facility]) 12/18/2017  . Tetraplegia (HCC) 12/18/2017  . Aortic aneurysm (HCC) 12/16/2017  . Cord compression ([facility]CC) 12/13/2017  .  Chronic combined systolic and diastolic heart failure ([facility]CC) 10/09/2016  . CAD S/P percutaneous coronary angioplasty 09/20/2016  . Erectile dysfunction 07/30/2015  . Morbid obesity with BMI of 50.0-59.9, adult (HCC) 07/18/2013  . History of pulmonary embolism 07/13/2013  . Angioedema of lips 07/13/2013  . Multinodular goiter 07/13/2013  . Dyslipidemia, goal LDL below 70 10/02/2011  . SOB (shortness of breath) 10/02/2011  . BPH with urinary obstruction 07/17/2010  . Diabetes mellitus type 2, controlled (HCC) 07/17/2010  . Bilateral lower extremity edema 12/08/2008  . NEUROPATHY, IDIOPATHIC PERIPHERAL NEC 05/31/2007  . Osteoarthritis 03/22/2007  . Gout 03/15/2007  . Essential hypertension 03/15/2007    Azra Abrell, PT 07/16/2018, 1:57 PM   [facility] [address] [place], [place], [address] Phone: [phone]   Fax:  [phone]  Name: Franklin Hicks MRN: [id number] Date of Birth: Jun 14, 1951

## 2018-07-16 NOTE — Therapy (Signed)
[facility] [address] [place], [place], [address] Phone: [phone]   Fax:  [phone]  Occupational Therapy Treatment  Patient Details  Name: Franklin Hicks MRN: [id number] Date of Birth: Aug 08, 1951 Referring Provider (OT): Jerrilyn Cairo Date: 07/16/2018  OT End of Session - 07/16/18 1343    Visit Number  9    Number of Visits  17    Date for OT Re-Evaluation  08/06/18    Authorization Type  Medicare    Authorization - Visit Number  9    Authorization - Number of Visits  10    OT Start Time  1145    OT Stop Time  1230    OT Time Calculation (min)  45 min    Activity Tolerance  Patient tolerated treatment well    Behavior During Therapy  [facility] for tasks assessed/performed       Past Medical History:  Diagnosis Date  . Arthritis    "knees" (12/17/2017)  . CAD S/P percutaneous coronary angioplasty 09/20/2016   Nstemi 08/2016. Dr. Algie Coffer. DES- brilinta and asa. Requests change to [facility] cardiology; 95% Ramus -> PCI Resolute Onyx DES 2.75 x 18  . Central cord syndrome (HCC) 12/17/2017  . Chronic combined systolic and diastolic heart failure (HCC) 10/09/2016   EF 45% and grade II diastolic after nstemi  . Diet-controlled diabetes mellitus (HCC)   . DJD (degenerative joint disease)   . Gout   . High cholesterol   . Hypertension   . NSTEMI (non-ST elevated myocardial infarction) (HCC) 09/20/2016   95% Ramus - > PCI   . Obesity   . Recurrent pulmonary embolism (HCC) 11/'14; 4/'19   a) Bilateral segmental and subsegmental pulmonary emboli with mild RV Strain.; b) after fall with C-spine Fxr --> Acute PE of right main pulmonary artery extending into multiple segments.    Past Surgical History:  Procedure Laterality Date  . CARDIAC CATHETERIZATION N/A 09/19/2016   Procedure: Left Heart Cath and Coronary Angiography;  Surgeon: Orpah Cobb, MD;  [facility]: [facility] INVASIVE CV LAB;  Service: Cardiovascular: 95% proximal  Ramus Intermedius --> PCI  . CARDIAC CATHETERIZATION N/A 09/19/2016   Procedure: Coronary Stent Intervention;  Surgeon: Yvonne Kendall, MD;  Location: [facility] INVASIVE CV LAB;  Service: Cardiovascular: 95% ramus intermedius;  Resolute Onyx 2.75 x 18 mm drug-eluting stent  . CYSTOSCOPY/RETROGRADE/URETEROSCOPY/STONE EXTRACTION WITH BASKET  1324,4010   ureteral stone   . MENISCUS REPAIR Left    knee open meniscetomy  . TRANSTHORACIC ECHOCARDIOGRAM  2004   no lvh nl ejection fraction  . TRANSTHORACIC ECHOCARDIOGRAM  09/19/2016   In setting of NSTEMI:  EF 45-50% with diffuse hypokinesis. GR 2 DD. Mild biatrial enlargement.    There were no vitals filed for this visit.  Subjective Assessment - 07/16/18 1338    Subjective   I think it may be hard to use the walker in the bathroom.      Patient is accompained by:  Family member    Pertinent History  C4 SCI (central cord) tetraplegia 12/13/17 from fall in tub    Currently in Pain?  No/denies                   OT Treatments/Exercises (OP) - 07/16/18 0001      ADLs   Bathing  Simulated home environment to address walking from bedroom to bathroom  and transferring onto tub transfer bench.  Verified with patient type of tub transfer bench he has  at home.  Simualted bathroom arrangement.   Patient would most likely need to side step through door.  Patient does not anticiapte his wheelchair fitting through bedroom door.  Patient able to navigate around furniture,and transition from low pile carpet to tile as in his master bed/bath.  Patient needed demonstration and then verbal cueing for successful tub transfer practice.      ADL Comments  Discussion with patient regarding remianing LTG's.  Patient is showing improvment in clinic with functional mobility, but has not tried any bathroom transfers yet.  Encouraged patient to try, now that he is physically stronger, and more mobile.               OT Education - 07/16/18 1343    Education  Details  Shower transfers using tub transfer bench    Person(s) Educated  Patient    Methods  Explanation;Demonstration    Comprehension  Verbalized understanding;Returned demonstration;Verbal cues required       OT Short Term Goals - 07/07/18 1438      OT SHORT TERM GOAL #1   Title  Patient will complete a home exercise program designed to improve AROM in Bilateral shoulders with min assist (due 07/07/18)    Status  Achieved      OT SHORT TERM GOAL #2   Title  Patient will complete a stand step transfer to commode with mod assist    Status  Achieved      OT SHORT TERM GOAL #3   Title  Patient will demonstrate static stand with min assist with upper extremities out of support for 1 minute    Status  Achieved      OT SHORT TERM GOAL #4   Title  Patient will transition from supine to sit with mod assist in preparation for return to regular (vs hospital) bed    Status  Achieved      OT SHORT TERM GOAL #5   Title  Patient will begin graduated driving program - first will attempt to operate car in empty lot with another adult driver.      Status  Achieved   pt is currently driving       OT Long Term Goals - 07/16/18 1346      OT LONG TERM GOAL #1   Title  Patient will complete an upgraded HEP designed to improve BUE strength and functional range of motion (due 08/06/18)    Status  On-going      OT LONG TERM GOAL #2   Title  Patient will be modified independnet with toilet hygiene    Status  On-going      OT LONG TERM GOAL #3   Title  Patient will complete shower using tub transfer bench and min assist    Status  On-going      OT LONG TERM GOAL #4   Title  Patient will demonstrate adequate dynamic stand balance tolerance (5 min) to assist with simple cooking or grilling task    Status  On-going      OT LONG TERM GOAL #5   Title  Patient will demonstrate ability to transition from sit to supine, supine to sit, and roll himself in regular bed in preparation for d/c  HOSPITAL BED.      Status  On-going      OT LONG TERM GOAL #6   Title  Patient will return to driving    Status  Achieved  Plan - 07/16/18 1344    Clinical Impression Statement  Patient showing steady progress with arm movement and functional mobility in the clinic.  Need to have these skills transfer to home environment - patient admittedly fearful of bathroom transfers at home.      Occupational Profile and client history currently impacting functional performance  Husband, father, grandfather, retired Air traffic controllerHS teacher, enjoys watching sports, water exercise class    Occupational performance deficits (Please refer to evaluation for details):  ADL's;IADL's;Rest and Sleep;Leisure    Rehab Potential  Good    Current Impairments/barriers affecting progress:  obesity    OT Frequency  2x / week    OT Duration  8 weeks    OT Treatment/Interventions  Self-care/ADL training;Aquatic Therapy;Therapeutic exercise;Functional Mobility Training;Balance training;Manual Therapy;Neuromuscular education;Therapeutic activities;Energy conservation;DME and/or AE instruction;Moist Heat;Cryotherapy;Patient/family education    Plan  Functional mobility in kitchen - working toward cooking (grilling goal)  Functional transfers - maybe try toilet transfers in bathroom.  UE range of motion / strengthening    Clinical Decision Making  Several treatment options, min-mod task modification necessary    Consulted and Agree with Plan of Care  Patient       Patient will benefit from skilled therapeutic intervention in order to improve the following deficits and impairments:  Decreased skin integrity, Decreased knowledge of precautions, Improper body mechanics, Decreased activity tolerance, Decreased knowledge of use of DME, Decreased strength, Impaired flexibility, Decreased balance, Decreased mobility, Difficulty walking, Impaired sensation, Obesity, Decreased range of motion, Increased edema, Pain, Decreased  coordination, Impaired UE functional use  Visit Diagnosis: Muscle weakness (generalized)  Unsteadiness on feet  Stiffness of right shoulder, not elsewhere classified  Stiffness of left shoulder, not elsewhere classified  Other disturbances of skin sensation    Problem List Patient Active Problem List   Diagnosis Date Noted  . Recurrent pulmonary embolism (HCC)   . Pain of left heel   . C4 spinal cord injury, sequela (HCC) 12/18/2017  . Tetraplegia (HCC) 12/18/2017  . Aortic aneurysm (HCC) 12/16/2017  . Cord compression (HCC) 12/13/2017  . Chronic combined systolic and diastolic heart failure (HCC) 10/09/2016  . CAD S/P percutaneous coronary angioplasty 09/20/2016  . Erectile dysfunction 07/30/2015  . Morbid obesity with BMI of 50.0-59.9, adult (HCC) 07/18/2013  . History of pulmonary embolism 07/13/2013  . Angioedema of lips 07/13/2013  . Multinodular goiter 07/13/2013  . Dyslipidemia, goal LDL below 70 10/02/2011  . SOB (shortness of breath) 10/02/2011  . BPH with urinary obstruction 07/17/2010  . Diabetes mellitus type 2, controlled (HCC) 07/17/2010  . Bilateral lower extremity edema 12/08/2008  . NEUROPATHY, IDIOPATHIC PERIPHERAL NEC 05/31/2007  . Osteoarthritis 03/22/2007  . Gout 03/15/2007  . Essential hypertension 03/15/2007    Collier SalinaGellert, Jaiquan Temme M, OTR/L 07/16/2018, 1:47 PM  [facility] [address] [place], [place], [address] Phone: [phone]   Fax:  [phone]  Name: Berna SpareCarl J Eimers MRN: [id number] Date of Birth: 08/07/1951

## 2018-07-21 ENCOUNTER — Ambulatory Visit: Payer: Medicare Other | Admitting: Physical Therapy

## 2018-07-21 ENCOUNTER — Ambulatory Visit: Payer: Medicare Other | Admitting: Occupational Therapy

## 2018-07-28 ENCOUNTER — Ambulatory Visit: Payer: Medicare Other | Attending: Physical Medicine & Rehabilitation | Admitting: Physical Therapy

## 2018-07-28 ENCOUNTER — Encounter: Payer: Self-pay | Admitting: Physical Therapy

## 2018-07-28 DIAGNOSIS — M25611 Stiffness of right shoulder, not elsewhere classified: Secondary | ICD-10-CM | POA: Insufficient documentation

## 2018-07-28 DIAGNOSIS — M6281 Muscle weakness (generalized): Secondary | ICD-10-CM | POA: Diagnosis present

## 2018-07-28 DIAGNOSIS — R208 Other disturbances of skin sensation: Secondary | ICD-10-CM | POA: Diagnosis present

## 2018-07-28 DIAGNOSIS — R2681 Unsteadiness on feet: Secondary | ICD-10-CM

## 2018-07-28 DIAGNOSIS — R2689 Other abnormalities of gait and mobility: Secondary | ICD-10-CM | POA: Diagnosis present

## 2018-07-28 DIAGNOSIS — M25612 Stiffness of left shoulder, not elsewhere classified: Secondary | ICD-10-CM | POA: Diagnosis present

## 2018-07-28 DIAGNOSIS — S14129S Central cord syndrome at unspecified level of cervical spinal cord, sequela: Secondary | ICD-10-CM | POA: Diagnosis present

## 2018-07-28 NOTE — Patient Instructions (Addendum)
Witt is able to walk 187f  And was able to 1170fwithout the w/c following in therapy today. Morrison's Short Term goal: walking household distances (50Ft) modified independent pt partially met due to him walking most times with w/c following.  CaMajestyhould work on walking at home consistently without w/c following.  CaJceonhould work on sitting on other chairs ie. [place] chairs to wean off w/c. CaLonnellhould work on walking into therapy from front entrance without W/C  KaBjorn LoserPTA  07/28/18, 10:23 AM

## 2018-07-28 NOTE — Therapy (Signed)
[facility] [address] [place], [place], [address] Phone: [phone]   Fax:  [phone]  Physical Therapy Treatment  Patient Details  Name: Franklin Hicks MRN: [id number] Date of Birth: 29-Aug-1950 Referring Provider (PT): Alger Simons, MD   Encounter Date: 07/28/2018  PT End of Session - 07/28/18 1357    Visit Number  12    Number of Visits  25    Date for PT Re-Evaluation  08/24/18    Authorization Type  $20 copay [employer] medicare    PT Start Time  0933    PT Stop Time  1025    PT Time Calculation (min)  52 min    Activity Tolerance  Patient tolerated treatment well    Behavior During Therapy  [facility] for tasks assessed/performed       Past Medical History:  Diagnosis Date  . Arthritis    "knees" (12/17/2017)  . CAD S/P percutaneous coronary angioplasty 09/20/2016   Nstemi 08/2016. Dr. Doylene Canard. DES- brilinta and asa. Requests change to [facility] cardiology; 95% Ramus -> PCI Resolute Onyx DES 2.75 x 18  . Central cord syndrome ([place]) 12/17/2017  . Chronic combined systolic and diastolic heart failure ([place]) 10/09/2016   EF 45% and grade II diastolic after nstemi  . Diet-controlled diabetes mellitus ([place])   . DJD (degenerative joint disease)   . Gout   . High cholesterol   . Hypertension   . NSTEMI (non-ST elevated myocardial infarction) ([place]) 09/20/2016   95% Ramus - > PCI   . Obesity   . Recurrent pulmonary embolism ([place]) 11/'14; 4/'19   a) Bilateral segmental and subsegmental pulmonary emboli with mild RV Strain.; b) after fall with C-spine Fxr --> Acute PE of right main pulmonary artery extending into multiple segments.    Past Surgical History:  Procedure Laterality Date  . CARDIAC CATHETERIZATION N/A 09/19/2016   Procedure: Left Heart Cath and Coronary Angiography;  Surgeon: Dixie Dials, MD;  Location: [place] CV LAB;  Service: Cardiovascular: 95% proximal Ramus Intermedius --> PCI  . CARDIAC CATHETERIZATION N/A  09/19/2016   Procedure: Coronary Stent Intervention;  Surgeon: Nelva Bush, MD;  Location: [place] CV LAB;  Service: Cardiovascular: 95% ramus intermedius;  Resolute Onyx 2.75 x 18 mm drug-eluting stent  . CYSTOSCOPY/RETROGRADE/URETEROSCOPY/STONE EXTRACTION WITH BASKET  7473,4037   ureteral stone   . MENISCUS REPAIR Left    knee open meniscetomy  . TRANSTHORACIC ECHOCARDIOGRAM  2004   no lvh nl ejection fraction  . TRANSTHORACIC ECHOCARDIOGRAM  09/19/2016   In setting of NSTEMI:  EF 45-50% with diffuse hypokinesis. GR 2 DD. Mild biatrial enlargement.    There were no vitals filed for this visit.  Subjective Assessment - 07/28/18 0938    Subjective  Keeping up with exercises at home.  Wife continues to follow pt in the house with w/c when pt is walking. Pt continues to sit in w/c at home.    Patient is accompained by:  Family member    Pertinent History  history of CAD with chronic combined CHF, T2DM, morbid obesity, hypertension, PE in the past; venous stasis ulcers.    Patient Stated Goals  Returning to greatest level of independence.  (prior status was short distance walking with 2 canes).    Currently in Pain?  No/denies                       [facility] Adult PT Treatment/Exercise - 07/28/18 0001  Transfers   Transfers  Stand to Sit;Sit to Stand    Sit to Stand  6: Modified independent (Device/Increase time)    Stand to Sit  6: Modified independent (Device/Increase time)      Ambulation/Gait   Ambulation/Gait  Yes    Ambulation/Gait Assistance  5: Supervision    Ambulation/Gait Assistance Details  no w/c following 115, then w/c following last half for safety    Ambulation Distance (Feet) 160 Feet   no seated rest breaks   Assistive device  Rolling walker    Gait Pattern  Decreased stance time - left;Decreased step length - right;Decreased step length - left;Poor foot clearance - left;Poor foot clearance - right;Wide base of support;Trunk flexed;Shuffle     Gait velocity  0.67 ft/sec             PT Education - 07/28/18 1355    Education Details  Discussed goals checked and reasonable expectations for weaning of reliance on w/c.    Person(s) Educated  Patient    Methods  Explanation;Handout    Comprehension  Verbalized understanding       PT Short Term Goals - 07/28/18 1358      PT SHORT TERM GOAL #1   Title  The patient will be indep with progression of HEP.    Time  4    Period  Weeks    Status  New      PT SHORT TERM GOAL #2   Title  The patient will ambulate in the home with RW mod indep with short distances (x 50 ft) per report.    Baseline  Pt can walked this distance without w/c following but reports most times wife follows with w/c.    Time  4    Period  Weeks    Status  Partially Met      PT SHORT TERM GOAL #3   Title  The patient will be able to return to sitting on living room furniture and moving sit>stand to [place] from his chair in home.    Baseline  Pt can sit<>stand from dining room chairs but living room furniture is still to low.  Pt continues to sit in w/c in home at this time.    Time  4    Period  Weeks    Status  Partially Met      PT SHORT TERM GOAL #4   Title  The patient will ambulate x 200 ft nonstop with RW and supervision.    Baseline  160 feet without seated rest break with RW at [place].    Time  4    Period  Weeks    Status  Not Met      PT SHORT TERM GOAL #5   Title  The patient will imrpove gait speed from 0.37 ft/sec to > or equal to 0.6 ft/sec to demo improving functional mobility.    Baseline  0.6 ft/sec with RW.    Time  4    Period  Weeks    Status  Achieved        PT Long Term Goals - 05/26/18 1747      PT LONG TERM GOAL #1   Title  The patient will be indep with progression of HEP for post d/c strengthening and mobility. (LTG due date 08/24/2018)    Time  12    Period  Weeks    Target Date  08/24/18      PT LONG TERM GOAL #2  Title  The patient will be indep with  bed mobility in standard bed (not relying on hospital bed).    Time  12    Period  Weeks    Target Date  08/24/18      PT LONG TERM GOAL #3   Title  The patient will ambulate x 400 ft with RW mod indep for household and short distance community mobility.    Time  12    Period  Weeks    Target Date  08/24/18      PT LONG TERM GOAL #4   Title  Further gait speed goal to follow.    Time  12    Period  Weeks    Target Date  08/24/18      PT LONG TERM GOAL #5   Title  Stair goal to follow, when appropriate    Time  12    Period  Weeks    Target Date  08/24/18            Plan - 07/28/18 1403    Clinical Impression Statement  Skilled session focused on checking STGs and discussing plan on how pt can progress towards greater independence with mobility.  Pt partially met STG#2-4 and met STG#5. Pt has progressed with gait speed and activity tolerance with longer gait distance with RW at supervision level.                                                  PT Treatment/Interventions  ADLs/Self Care Home Management;Balance training;Neuromuscular re-education;Patient/family education;Gait training;Stair training;Functional mobility training;DME Instruction;Therapeutic activities;Therapeutic exercise    PT Next Visit Plan  Please check STG #1 next visit.  Standing ther ex, Increase HEP., Gait to increase distnace, sci-fit, knee /hip/ gastroc stretching.    Consulted and Agree with Plan of Care  Patient       Patient will benefit from skilled therapeutic intervention in order to improve the following deficits and impairments:  Abnormal gait, Impaired sensation, Obesity, Decreased activity tolerance, Decreased balance, Decreased strength, Pain, Decreased mobility, Difficulty walking, Decreased range of motion, Impaired flexibility, Postural dysfunction  Visit Diagnosis: Muscle weakness (generalized)  Unsteadiness on feet  Other abnormalities of gait and mobility     Problem  List Patient Active Problem List   Diagnosis Date Noted  . Recurrent pulmonary embolism ([place])   . Pain of left heel   . C4 spinal cord injury, sequela ([place]) 12/18/2017  . Tetraplegia ([place]) 12/18/2017  . Aortic aneurysm ([place]) 12/16/2017  . Cord compression ([place]) 12/13/2017  . Chronic combined systolic and diastolic heart failure ([place]) 10/09/2016  . CAD S/P percutaneous coronary angioplasty 09/20/2016  . Erectile dysfunction 07/30/2015  . Morbid obesity with BMI of 50.0-59.9, adult ([place]) 07/18/2013  . History of pulmonary embolism 07/13/2013  . Angioedema of lips 07/13/2013  . Multinodular goiter 07/13/2013  . Dyslipidemia, goal LDL below 70 10/02/2011  . SOB (shortness of breath) 10/02/2011  . BPH with urinary obstruction 07/17/2010  . Diabetes mellitus type 2, controlled ([place]) 07/17/2010  . Bilateral lower extremity edema 12/08/2008  . NEUROPATHY, IDIOPATHIC PERIPHERAL NEC 05/31/2007  . Osteoarthritis 03/22/2007  . Gout 03/15/2007  . Essential hypertension 03/15/2007   Bjorn Loser, PTA  07/28/18, 2:08 PM [facility] [address] [place], [place], [address] Phone: [phone]  Fax:  [phone]  Name: Franklin Hicks MRN: [id number] Date of Birth: Jun 16, 1951

## 2018-07-30 ENCOUNTER — Ambulatory Visit: Payer: Medicare Other | Admitting: Occupational Therapy

## 2018-07-30 ENCOUNTER — Encounter: Payer: Self-pay | Admitting: Physical Therapy

## 2018-07-30 ENCOUNTER — Encounter: Payer: Self-pay | Admitting: Occupational Therapy

## 2018-07-30 ENCOUNTER — Ambulatory Visit: Payer: Medicare Other | Admitting: Physical Therapy

## 2018-07-30 DIAGNOSIS — M6281 Muscle weakness (generalized): Secondary | ICD-10-CM

## 2018-07-30 DIAGNOSIS — M25611 Stiffness of right shoulder, not elsewhere classified: Secondary | ICD-10-CM

## 2018-07-30 DIAGNOSIS — S14129S Central cord syndrome at unspecified level of cervical spinal cord, sequela: Secondary | ICD-10-CM

## 2018-07-30 DIAGNOSIS — R2681 Unsteadiness on feet: Secondary | ICD-10-CM

## 2018-07-30 DIAGNOSIS — M25612 Stiffness of left shoulder, not elsewhere classified: Secondary | ICD-10-CM

## 2018-07-30 DIAGNOSIS — R208 Other disturbances of skin sensation: Secondary | ICD-10-CM

## 2018-07-30 DIAGNOSIS — R2689 Other abnormalities of gait and mobility: Secondary | ICD-10-CM

## 2018-07-30 NOTE — Therapy (Signed)
[facility] [address] [place], [place], [address] Phone: [phone]   Fax:  [phone]  Occupational Therapy Treatment  Patient Details  Name: Franklin Hicks MRN: [id number] Date of Birth: 08/20/1951 Referring Provider (OT): Jerrilyn Cairo Date: 07/30/2018  OT End of Session - 07/30/18 1051    Visit Number  10    Number of Visits  17    Date for OT Re-Evaluation  08/06/18    Authorization Type  Medicare    Authorization - Visit Number  10    Authorization - Number of Visits  20    OT Start Time  0933    OT Stop Time  1020    OT Time Calculation (min)  47 min    Activity Tolerance  Patient tolerated treatment well    Behavior During Therapy  [facility] for tasks assessed/performed       Past Medical History:  Diagnosis Date  . Arthritis    "knees" (12/17/2017)  . CAD S/P percutaneous coronary angioplasty 09/20/2016   Nstemi 08/2016. Dr. Algie Coffer. DES- brilinta and asa. Requests change to [facility] cardiology; 95% Ramus -> PCI Resolute Onyx DES 2.75 x 18  . Central cord syndrome (HCC) 12/17/2017  . Chronic combined systolic and diastolic heart failure (HCC) 10/09/2016   EF 45% and grade II diastolic after nstemi  . Diet-controlled diabetes mellitus (HCC)   . DJD (degenerative joint disease)   . Gout   . High cholesterol   . Hypertension   . NSTEMI (non-ST elevated myocardial infarction) (HCC) 09/20/2016   95% Ramus - > PCI   . Obesity   . Recurrent pulmonary embolism (HCC) 11/'14; 4/'19   a) Bilateral segmental and subsegmental pulmonary emboli with mild RV Strain.; b) after fall with C-spine Fxr --> Acute PE of right main pulmonary artery extending into multiple segments.    Past Surgical History:  Procedure Laterality Date  . CARDIAC CATHETERIZATION N/A 09/19/2016   Procedure: Left Heart Cath and Coronary Angiography;  Surgeon: Orpah Cobb, MD;  Location: [facility] INVASIVE CV LAB;  Service: Cardiovascular: 95% proximal  Ramus Intermedius --> PCI  . CARDIAC CATHETERIZATION N/A 09/19/2016   Procedure: Coronary Stent Intervention;  Surgeon: Yvonne Kendall, MD;  Location: [facility] INVASIVE CV LAB;  Service: Cardiovascular: 95% ramus intermedius;  Resolute Onyx 2.75 x 18 mm drug-eluting stent  . CYSTOSCOPY/RETROGRADE/URETEROSCOPY/STONE EXTRACTION WITH BASKET  6962,9528   ureteral stone   . MENISCUS REPAIR Left    knee open meniscetomy  . TRANSTHORACIC ECHOCARDIOGRAM  2004   no lvh nl ejection fraction  . TRANSTHORACIC ECHOCARDIOGRAM  09/19/2016   In setting of NSTEMI:  EF 45-50% with diffuse hypokinesis. GR 2 DD. Mild biatrial enlargement.    There were no vitals filed for this visit.  Subjective Assessment - 07/30/18 1040    Subjective   Patient indicated he made it into his bathroom and used the commode for the first time since leaving the hospital!    Pertinent History  C4 SCI (central cord) tetraplegia 12/13/17 from fall in tub    Currently in Pain?  Yes    Pain Score  4     Pain Location  Shoulder    Pain Orientation  Right;Left    Pain Descriptors / Indicators  Aching    Pain Type  Chronic pain    Pain Onset  More than a month ago    Pain Frequency  Intermittent    Aggravating Factors   increased walking (pressure  through hands on walker)    Pain Relieving Factors  rest, gentle motion                   OT Treatments/Exercises (OP) - 07/30/18 0001      ADLs   Functional Mobility  Working on sit to supine, and supine to sit with grater independence to preapre for return to his bed, and discontinue the hospital bed.     Cooking  Worked in [employer] on simple cooking task to address reduced UE reliance in standing.  Patient able to transition sit to stand without assistance, then side step using countertop to reach stove.  Patient able to cook scrambled eggs while standing - stood at least 5 minutes.  Patient amazed that he was able to perform this task- "wait until I tell my wife!"  Patient able  to use one hand at a time, still relying on second hand for support of body weight.  Ultimate goal is to support body weight with LE's to allow two hands simultaneoulsy to complete functional task.  Patient able to reach into shoulder height cabinet to obtain a lightweight plastic plate today.        Neurological Re-education Exercises   Other Exercises 1  Right shoulder more painful today which patient attributes to greater amounts of walking at home.  Worked on scapular, and glenohumeral joint motion in BUE'S.  AAROM R shoulder flex - 125, L shouler flewx 135.               OT Education - 07/30/18 1049    Education Details  shoulder mechanics for low to mid reach    Person(s) Educated  Patient    Methods  Explanation;Demonstration    Comprehension  Verbalized understanding;Returned demonstration;Need further instruction       OT Short Term Goals - 07/30/18 1055      OT SHORT TERM GOAL #1   Title  Patient will complete a home exercise program designed to improve AROM in Bilateral shoulders with min assist (due 07/07/18)    Time  4    Period  Weeks    Status  Achieved      OT SHORT TERM GOAL #2   Title  Patient will complete a stand step transfer to commode with mod assist    Time  4    Period  Weeks    Status  Achieved      OT SHORT TERM GOAL #3   Title  Patient will demonstrate static stand with min assist with upper extremities out of support for 1 minute    Time  4    Period  Weeks    Status  Achieved      OT SHORT TERM GOAL #4   Title  Patient will transition from supine to sit with mod assist in preparation for return to regular (vs hospital) bed    Time  4    Period  Weeks    Status  Achieved      OT SHORT TERM GOAL #5   Title  Patient will begin graduated driving program - first will attempt to operate car in empty lot with another adult driver.      Time  4    Period  Weeks    Status  Achieved        OT Long Term Goals - 07/30/18 1056      OT LONG  TERM GOAL #1   Title  Patient will complete  an upgraded HEP designed to improve BUE strength and functional range of motion (due 08/06/18)    Time  8    Period  Weeks    Status  On-going      OT LONG TERM GOAL #2   Title  Patient will be modified independnet with toilet hygiene    Time  8    Period  Weeks    Status  On-going      OT LONG TERM GOAL #3   Title  Patient will complete shower using tub transfer bench and min assist    Time  8    Period  Weeks    Status  On-going      OT LONG TERM GOAL #4   Title  Patient will demonstrate adequate dynamic stand balance tolerance (5 min) to assist with simple cooking or grilling task    Time  8    Period  Weeks    Status  Achieved      OT LONG TERM GOAL #5   Title  Patient will demonstrate ability to transition from sit to supine, supine to sit, and roll himself in regular bed in preparation for d/c HOSPITAL BED.      Time  8    Period  Weeks    Status  On-going      OT LONG TERM GOAL #6   Title  Patient will return to driving    Time  8    Period  Weeks    Status  Achieved            Plan - 07/30/18 1051    Clinical Impression Statement  Patient has shown steady progress with functional mobility, improved mobility / functional use of UE's and this is carrying over to improved participation in ADL/IADL at home.     Occupational Profile and client history currently impacting functional performance  Husband, father, grandfather, retired Air traffic controller, enjoys watching sports, water exercise class    Occupational performance deficits (Please refer to evaluation for details):  ADL's;IADL's;Rest and Sleep;Leisure    Rehab Potential  Good    Current Impairments/barriers affecting progress:  obesity    OT Frequency  2x / week    OT Duration  8 weeks    OT Treatment/Interventions  Self-care/ADL training;Aquatic Therapy;Therapeutic exercise;Functional Mobility Training;Balance training;Manual Therapy;Neuromuscular education;Therapeutic  activities;Energy conservation;DME and/or AE instruction;Moist Heat;Cryotherapy;Patient/family education    Plan  functional transfers in cramped condition- simulated bathroom.  Shoulder range of motion - passive to active toward mid reach    Clinical Decision Making  Several treatment options, min-mod task modification necessary    OT Home Exercise Plan  Initiated supine Bilateral shoulder exercises    Consulted and Agree with Plan of Care  Patient       Patient will benefit from skilled therapeutic intervention in order to improve the following deficits and impairments:  Decreased skin integrity, Decreased knowledge of precautions, Improper body mechanics, Decreased activity tolerance, Decreased knowledge of use of DME, Decreased strength, Impaired flexibility, Decreased balance, Decreased mobility, Difficulty walking, Impaired sensation, Obesity, Decreased range of motion, Increased edema, Pain, Decreased coordination, Impaired UE functional use  Visit Diagnosis: Muscle weakness (generalized)  Unsteadiness on feet  Stiffness of right shoulder, not elsewhere classified  Stiffness of left shoulder, not elsewhere classified  Other disturbances of skin sensation  Central cord syndrome, sequela (HCC)    Problem List Patient Active Problem List   Diagnosis Date Noted  . Recurrent pulmonary embolism (HCC)   .  Pain of left heel   . C4 spinal cord injury, sequela (HCC) 12/18/2017  . Tetraplegia (HCC) 12/18/2017  . Aortic aneurysm (HCC) 12/16/2017  . Cord compression (HCC) 12/13/2017  . Chronic combined systolic and diastolic heart failure (HCC) 10/09/2016  . CAD S/P percutaneous coronary angioplasty 09/20/2016  . Erectile dysfunction 07/30/2015  . Morbid obesity with BMI of 50.0-59.9, adult (HCC) 07/18/2013  . History of pulmonary embolism 07/13/2013  . Angioedema of lips 07/13/2013  . Multinodular goiter 07/13/2013  . Dyslipidemia, goal LDL below 70 10/02/2011  . SOB (shortness  of breath) 10/02/2011  . BPH with urinary obstruction 07/17/2010  . Diabetes mellitus type 2, controlled (HCC) 07/17/2010  . Bilateral lower extremity edema 12/08/2008  . NEUROPATHY, IDIOPATHIC PERIPHERAL NEC 05/31/2007  . Osteoarthritis 03/22/2007  . Gout 03/15/2007  . Essential hypertension 03/15/2007   Occupational Therapy Progress Note  Dates of Reporting Period: 06/07/18  to 07/30/18  Collier SalinaGellert, Kristin M, OTR/L 07/30/2018, 10:58 AM  [facility] [address] [place], [place], [address] Phone: [phone]   Fax:  [phone]  Name: Franklin Hicks MRN: [id number] Date of Birth: 11/08/1950

## 2018-07-30 NOTE — Therapy (Addendum)
[facility] [address] [place], [place], [address] Phone: [phone]   Fax:  [phone]  Physical Therapy Treatment  Patient Details  Name: Franklin Hicks MRN: [id number] Date of Birth: August 31, 1950 Referring Provider (PT): Alger Simons, MD   Encounter Date: 07/30/2018  PT End of Session - 07/30/18 0853    Visit Number  13    Number of Visits  25    Date for PT Re-Evaluation  08/24/18    Authorization Type  $20 copay [employer] medicare    PT Start Time  0850    PT Stop Time  0935    PT Time Calculation (min)  45 min    Activity Tolerance  Patient tolerated treatment well    Behavior During Therapy  [facility] for tasks assessed/performed       Past Medical History:  Diagnosis Date  . Arthritis    "knees" (12/17/2017)  . CAD S/P percutaneous coronary angioplasty 09/20/2016   Nstemi 08/2016. Dr. Doylene Canard. DES- brilinta and asa. Requests change to [facility] cardiology; 95% Ramus -> PCI Resolute Onyx DES 2.75 x 18  . Central cord syndrome ([place]) 12/17/2017  . Chronic combined systolic and diastolic heart failure ([place]) 10/09/2016   EF 45% and grade II diastolic after nstemi  . Diet-controlled diabetes mellitus ([place])   . DJD (degenerative joint disease)   . Gout   . High cholesterol   . Hypertension   . NSTEMI (non-ST elevated myocardial infarction) ([place]) 09/20/2016   95% Ramus - > PCI   . Obesity   . Recurrent pulmonary embolism ([place]) 11/'14; 4/'19   a) Bilateral segmental and subsegmental pulmonary emboli with mild RV Strain.; b) after fall with C-spine Fxr --> Acute PE of right main pulmonary artery extending into multiple segments.    Past Surgical History:  Procedure Laterality Date  . CARDIAC CATHETERIZATION N/A 09/19/2016   Procedure: Left Heart Cath and Coronary Angiography;  Surgeon: Dixie Dials, MD;  Location: [place] CV LAB;  Service: Cardiovascular: 95% proximal Ramus Intermedius --> PCI  . CARDIAC CATHETERIZATION N/A  09/19/2016   Procedure: Coronary Stent Intervention;  Surgeon: Nelva Bush, MD;  Location: [place] CV LAB;  Service: Cardiovascular: 95% ramus intermedius;  Resolute Onyx 2.75 x 18 mm drug-eluting stent  . CYSTOSCOPY/RETROGRADE/URETEROSCOPY/STONE EXTRACTION WITH BASKET  1740,8144   ureteral stone   . MENISCUS REPAIR Left    knee open meniscetomy  . TRANSTHORACIC ECHOCARDIOGRAM  2004   no lvh nl ejection fraction  . TRANSTHORACIC ECHOCARDIOGRAM  09/19/2016   In setting of NSTEMI:  EF 45-50% with diffuse hypokinesis. GR 2 DD. Mild biatrial enlargement.    There were no vitals filed for this visit.  Subjective Assessment - 07/30/18 0854    Subjective  Has been able to do 12 SLR, standing 3 min x 2 x twice/day; heelslides for ROM more than strength    Patient is accompained by:  Family member    Pertinent History  history of CAD with chronic combined CHF, T2DM, morbid obesity, hypertension, PE in the past; venous stasis ulcers.    Patient Stated Goals  Returning to greatest level of independence.  (prior status was short distance walking with 2 canes).    Currently in Pain?  Yes    Pain Score  5     Pain Location  Shoulder    Pain Orientation  Right;Left    Pain Descriptors / Indicators  Aching    Pain Type  Chronic pain  Pain Onset  More than a month ago    Pain Frequency  Intermittent    Multiple Pain Sites  Yes    Pain Score  3    Pain Location  Knee    Pain Orientation  Left    Pain Descriptors / Indicators  Aching    Pain Type  Chronic pain    Pain Onset  More than a month ago    Pain Frequency  Constant    Aggravating Factors   standing, walking    Pain Relieving Factors  rest                       [facility] Adult PT Treatment/Exercise - 07/30/18 1217      Transfers   Transfers  Sit to Stand;Stand to Sit    Sit to Stand  6: Modified independent (Device/Increase time);With upper extremity assist;From bed;From chair/3-in-1    Sit to Stand Details  (indicate cue type and reason)  vc to minimize use of UEs    Stand to Sit  6: Modified independent (Device/Increase time)    Comments  5 in a row; 2 other sit to stand      Ambulation/Gait   Ambulation/Gait Assistance  5: Supervision    Ambulation/Gait Assistance Details  focus on maximizing left knee extension in stance (pt feels at times it will buckle); upright posture    Ambulation Distance (Feet)  70 Feet    Assistive device  Rolling walker    Gait Pattern  Decreased stance time - left;Decreased step length - right;Decreased step length - left;Poor foot clearance - left;Poor foot clearance - right;Wide base of support;Trunk flexed;Shuffle    Pre-Gait Activities  stand at sink, wt-shifting rt and left progress to marching left foot and raising rt heel when shifted to left; upright posture x 3 minutes      Knee/Hip Exercises: Seated   Long Arc Quad  Strengthening;Both;1 set;10 reps    Other Seated Knee/Hip Exercises  gentle ROM in midrange prior to exercise/walking             PT Education - 07/30/18 1222    Education Details  updated HEP to progress difficulty as some ex's had become "easy"    Person(s) Educated  Patient    Methods  Explanation;Demonstration;Verbal cues;Handout    Comprehension  Verbalized understanding;Returned demonstration;Verbal cues required       PT Short Term Goals - 07/30/18 1231      PT SHORT TERM GOAL #1   Title  The patient will be indep with progression of HEP.    Baseline  12/6 met    Time  4    Period  Weeks    Status  Achieved      PT SHORT TERM GOAL #2   Title  The patient will ambulate in the home with RW mod indep with short distances (x 50 ft) per report.    Baseline  Pt can walked this distance without w/c following but reports most times wife follows with w/c.    Time  4    Period  Weeks    Status  Partially Met      PT SHORT TERM GOAL #3   Title  The patient will be able to return to sitting on living room furniture and  moving sit>stand to [place] from his chair in home.    Baseline  Pt can sit<>stand from dining room chairs but living room furniture is still to  low.  Pt continues to sit in w/c in home at this time.    Time  4    Period  Weeks    Status  Partially Met      PT SHORT TERM GOAL #4   Title  The patient will ambulate x 200 ft nonstop with RW and supervision.    Baseline  160 feet without seated rest break with RW at [place].    Time  4    Period  Weeks    Status  Not Met      PT SHORT TERM GOAL #5   Title  The patient will imrpove gait speed from 0.37 ft/sec to > or equal to 0.6 ft/sec to demo improving functional mobility.    Baseline  0.6 ft/sec with RW.    Time  4    Period  Weeks    Status  Achieved        PT Long Term Goals - 05/26/18 1747      PT LONG TERM GOAL #1   Title  The patient will be indep with progression of HEP for post d/c strengthening and mobility. (LTG due date 08/24/2018)    Time  12    Period  Weeks    Target Date  08/24/18      PT LONG TERM GOAL #2   Title  The patient will be indep with bed mobility in standard bed (not relying on hospital bed).    Time  12    Period  Weeks    Target Date  08/24/18      PT LONG TERM GOAL #3   Title  The patient will ambulate x 400 ft with RW mod indep for household and short distance community mobility.    Time  12    Period  Weeks    Target Date  08/24/18      PT LONG TERM GOAL #4   Title  Further gait speed goal to follow.    Time  12    Period  Weeks    Target Date  08/24/18      PT LONG TERM GOAL #5   Title  Stair goal to follow, when appropriate    Time  12    Period  Weeks    Target Date  08/24/18            Plan - 07/30/18 1224    Clinical Impression Statement  Checked STG#1 and pt met goal. Noted some of his HEP had become less challenging (he had been increasing # reps) and updated program to include more weight-bearing/standing exercises. Patient making good progress and will conintue to work  towards LTGs    PT Treatment/Interventions  ADLs/Self Care Home Management;Balance training;Neuromuscular re-education;Patient/family education;Gait training;Stair training;Functional mobility training;DME Instruction;Therapeutic activities;Therapeutic exercise    PT Next Visit Plan  review changes to HEP and how it is going; ?gait in // bars with goal to return to using bil canes (pt's goal) ? add other Standing ther ex to HEP?, Gait to increase distnace, sci-fit, knee /hip/ gastroc stretching.   Consulted and Agree with Plan of Care  Patient       Patient will benefit from skilled therapeutic intervention in order to improve the following deficits and impairments:  Abnormal gait, Impaired sensation, Obesity, Decreased activity tolerance, Decreased balance, Decreased strength, Pain, Decreased mobility, Difficulty walking, Decreased range of motion, Impaired flexibility, Postural dysfunction  Visit Diagnosis: Muscle weakness (generalized)  Unsteadiness on feet  Other  abnormalities of gait and mobility     Problem List Patient Active Problem List   Diagnosis Date Noted  . Recurrent pulmonary embolism ([place])   . Pain of left heel   . C4 spinal cord injury, sequela ([place]) 12/18/2017  . Tetraplegia ([place]) 12/18/2017  . Aortic aneurysm ([place]) 12/16/2017  . Cord compression ([place]) 12/13/2017  . Chronic combined systolic and diastolic heart failure ([place]) 10/09/2016  . CAD S/P percutaneous coronary angioplasty 09/20/2016  . Erectile dysfunction 07/30/2015  . Morbid obesity with BMI of 50.0-59.9, adult ([place]) 07/18/2013  . History of pulmonary embolism 07/13/2013  . Angioedema of lips 07/13/2013  . Multinodular goiter 07/13/2013  . Dyslipidemia, goal LDL below 70 10/02/2011  . SOB (shortness of breath) 10/02/2011  . BPH with urinary obstruction 07/17/2010  . Diabetes mellitus type 2, controlled ([place]) 07/17/2010  . Bilateral lower extremity edema 12/08/2008  . NEUROPATHY, IDIOPATHIC  PERIPHERAL NEC 05/31/2007  . Osteoarthritis 03/22/2007  . Gout 03/15/2007  . Essential hypertension 03/15/2007    Rexanne Mano, PT 07/30/2018, 12:32 PM  [facility] [address] [place], [place], [address] Phone: [phone]   Fax:  [phone]  Name: RYLYN RANGANATHAN MRN: [id number] Date of Birth: 06-30-1951

## 2018-07-30 NOTE — Patient Instructions (Signed)
Access Code: [email or web address]  URL: [email or web address]  Date: 07/30/2018  Prepared by: Veda CanningLynn Latrice Storlie   Exercises  Supine Heel Slide - 10 reps - 2 sets - 1-2x daily - 7x weekly  Seated Knee Flexion Extension AROM - 10 reps - 2 sets - 1-2x daily - 7x weekly  Sit to Stand - 5-10 reps - 2 sets - - hold - 1-2x daily - 7x weekly

## 2018-08-02 ENCOUNTER — Ambulatory Visit: Payer: Medicare Other | Admitting: Occupational Therapy

## 2018-08-02 ENCOUNTER — Encounter: Payer: Self-pay | Admitting: Physical Therapy

## 2018-08-02 ENCOUNTER — Ambulatory Visit: Payer: Medicare Other | Admitting: Physical Therapy

## 2018-08-02 DIAGNOSIS — R2689 Other abnormalities of gait and mobility: Secondary | ICD-10-CM

## 2018-08-02 DIAGNOSIS — M6281 Muscle weakness (generalized): Secondary | ICD-10-CM

## 2018-08-02 DIAGNOSIS — R2681 Unsteadiness on feet: Secondary | ICD-10-CM

## 2018-08-02 NOTE — Therapy (Signed)
[facility] [address] [place], [place], [address] Phone: [phone]   Fax:  [phone]  Physical Therapy Treatment  Patient Details  Name: Franklin Hicks MRN: [id number] Date of Birth: Aug 05, 1951 Referring Provider (PT): Alger Simons, MD   Encounter Date: 08/02/2018  PT End of Session - 08/02/18 1018    Visit Number  14    Number of Visits  25    Date for PT Re-Evaluation  08/24/18    Authorization Type  $20 copay [employer] medicare    PT Start Time  [phone]    PT Stop Time  1018    PT Time Calculation (min)  42 min    Behavior During Therapy  [facility] for tasks assessed/performed       Past Medical History:  Diagnosis Date  . Arthritis    "knees" (12/17/2017)  . CAD S/P percutaneous coronary angioplasty 09/20/2016   Nstemi 08/2016. Dr. Doylene Canard. DES- brilinta and asa. Requests change to [facility] cardiology; 95% Ramus -> PCI Resolute Onyx DES 2.75 x 18  . Central cord syndrome ([place]) 12/17/2017  . Chronic combined systolic and diastolic heart failure ([place]) 10/09/2016   EF 45% and grade II diastolic after nstemi  . Diet-controlled diabetes mellitus ([place])   . DJD (degenerative joint disease)   . Gout   . High cholesterol   . Hypertension   . NSTEMI (non-ST elevated myocardial infarction) ([place]) 09/20/2016   95% Ramus - > PCI   . Obesity   . Recurrent pulmonary embolism ([place]) 11/'14; 4/'19   a) Bilateral segmental and subsegmental pulmonary emboli with mild RV Strain.; b) after fall with C-spine Fxr --> Acute PE of right main pulmonary artery extending into multiple segments.    Past Surgical History:  Procedure Laterality Date  . CARDIAC CATHETERIZATION N/A 09/19/2016   Procedure: Left Heart Cath and Coronary Angiography;  Surgeon: Dixie Dials, MD;  Location: [place] CV [facility];  Service: Cardiovascular: 95% proximal Ramus Intermedius --> PCI  . CARDIAC CATHETERIZATION N/A 09/19/2016   Procedure: Coronary Stent Intervention;   Surgeon: Nelva Bush, MD;  Location: [place] CV LAB;  Service: Cardiovascular: 95% ramus intermedius;  Resolute Onyx 2.75 x 18 mm drug-eluting stent  . CYSTOSCOPY/RETROGRADE/URETEROSCOPY/STONE EXTRACTION WITH BASKET  7510,2585   ureteral stone   . MENISCUS REPAIR Left    knee open meniscetomy  . TRANSTHORACIC ECHOCARDIOGRAM  2004   no lvh nl ejection fraction  . TRANSTHORACIC ECHOCARDIOGRAM  09/19/2016   In setting of NSTEMI:  EF 45-50% with diffuse hypokinesis. GR 2 DD. Mild biatrial enlargement.    There were no vitals filed for this visit.  Subjective Assessment - 08/02/18 0939    Subjective  Pt has worked on walking without w/c at home some but still has chair following at times.   Patient is accompained by:  Family member    Pertinent History  history of CAD with chronic combined CHF, T2DM, morbid obesity, hypertension, PE in the past; venous stasis ulcers.    Patient Stated Goals  Returning to greatest level of independence.  (prior status was short distance walking with 2 canes).    Currently in Pain?  No/denies    Pain Onset  More than a month ago                       [facility] Adult PT Treatment/Exercise - 08/02/18 0001      Ambulation/Gait   Ambulation/Gait  Yes    Ambulation/Gait  Assistance  5: Supervision    Ambulation/Gait Assistance Details  working on activity tolerance negotiating community barrier    Ambulation Distance (Feet)  50 Feet    Assistive device  Rolling walker    Gait Pattern  Decreased stance time - left;Decreased step length - right;Decreased step length - left;Poor foot clearance - left;Poor foot clearance - right;Wide base of support;Trunk flexed;Shuffle    Ramp  Other (comment)   Min guard; cues for hand placement on walking with descent     Exercises   Exercises  Knee/Hip      Lumbar Exercises: Stretches   Active Hamstring Stretch  Right;Left;1 rep;30 seconds   seated, cues for upright posture to rotate pelvis forward, then  lean forward   Single Knee to Chest Stretch  Right;Left;2 reps;60 seconds   pt used belt behnd knee and opposite LE extended with quad set for hip flexor stretch.     Knee/Hip Exercises: Seated   Long Arc Quad  Strengthening;Both;2 sets;10 reps             PT Education - 08/02/18 1251    Education Details  Benefits of consistently stretching for posture and pain management.   Person(s) Educated  Patient    Methods  Explanation    Comprehension  Verbalized understanding       PT Short Term Goals - 07/30/18 1231      PT SHORT TERM GOAL #1   Title  The patient will be indep with progression of HEP.    Baseline  12/6 met    Time  4    Period  Weeks    Status  Achieved      PT SHORT TERM GOAL #2   Title  The patient will ambulate in the home with RW mod indep with short distances (x 50 ft) per report.    Baseline  Pt can walked this distance without w/c following but reports most times wife follows with w/c.    Time  4    Period  Weeks    Status  Partially Met      PT SHORT TERM GOAL #3   Title  The patient will be able to return to sitting on living room furniture and moving sit>stand to [place] from his chair in home.    Baseline  Pt can sit<>stand from dining room chairs but living room furniture is still to low.  Pt continues to sit in w/c in home at this time.    Time  4    Period  Weeks    Status  Partially Met      PT SHORT TERM GOAL #4   Title  The patient will ambulate x 200 ft nonstop with RW and supervision.    Baseline  160 feet without seated rest break with RW at [place].    Time  4    Period  Weeks    Status  Not Met      PT SHORT TERM GOAL #5   Title  The patient will imrpove gait speed from 0.37 ft/sec to > or equal to 0.6 ft/sec to demo improving functional mobility.    Baseline  0.6 ft/sec with RW.    Time  4    Period  Weeks    Status  Achieved        PT Long Term Goals - 05/26/18 1747      PT LONG TERM GOAL #1   Title  The patient will  be  indep with progression of HEP for post d/c strengthening and mobility. (LTG due date 08/24/2018)    Time  12    Period  Weeks    Target Date  08/24/18      PT LONG TERM GOAL #2   Title  The patient will be indep with bed mobility in standard bed (not relying on hospital bed).    Time  12    Period  Weeks    Target Date  08/24/18      PT LONG TERM GOAL #3   Title  The patient will ambulate x 400 ft with RW mod indep for household and short distance community mobility.    Time  12    Period  Weeks    Target Date  08/24/18      PT LONG TERM GOAL #4   Title  Further gait speed goal to follow.    Time  12    Period  Weeks    Target Date  08/24/18      PT LONG TERM GOAL #5   Title  Stair goal to follow, when appropriate    Time  12    Period  Weeks    Target Date  08/24/18            Plan - 08/02/18 1240    Clinical Impression Statement  Skilled session focused on gait with community barrier negotiation with RW and LE stretching.  Pt able to perform ramp with RW at min guard level with min cues; pt able to perform hip flexor, extensor stretch, and hamstring stretch with min cues and min to supervsion level assist.  Pt would benefit from performing LE stretching consistently at home to address posture/pain issues pt has with standing, gait, and lying supine.                                                                                             PT Treatment/Interventions  ADLs/Self Care Home Management;Balance training;Neuromuscular re-education;Patient/family education;Gait training;Stair training;Functional mobility training;DME Instruction;Therapeutic activities;Therapeutic exercise    PT Next Visit Plan  Add supine knee to chest stretch with belt and opposite LE pushing into extension for hip flexor stretch, seated hamstring stretch. ?gait in // bars with goal to return to using bil canes (pt's goal) ? add other Standing ther ex to HEP?, Gait to increase distnace, sci-fit,  knee /hip/ gastroc stretching.    Consulted and Agree with Plan of Care  Patient       Patient will benefit from skilled therapeutic intervention in order to improve the following deficits and impairments:  Abnormal gait, Impaired sensation, Obesity, Decreased activity tolerance, Decreased balance, Decreased strength, Pain, Decreased mobility, Difficulty walking, Decreased range of motion, Impaired flexibility, Postural dysfunction  Visit Diagnosis: Muscle weakness (generalized)  Other abnormalities of gait and mobility  Unsteadiness on feet     Problem List Patient Active Problem List   Diagnosis Date Noted  . Recurrent pulmonary embolism ([place])   . Pain of left heel   . C4 spinal cord injury, sequela ([place]) 12/18/2017  . Tetraplegia ([place]) 12/18/2017  . Aortic aneurysm (  [place]) 12/16/2017  . Cord compression ([place]) 12/13/2017  . Chronic combined systolic and diastolic heart failure ([place]) 10/09/2016  . CAD S/P percutaneous coronary angioplasty 09/20/2016  . Erectile dysfunction 07/30/2015  . Morbid obesity with BMI of 50.0-59.9, adult ([place]) 07/18/2013  . History of pulmonary embolism 07/13/2013  . Angioedema of lips 07/13/2013  . Multinodular goiter 07/13/2013  . Dyslipidemia, goal LDL below 70 10/02/2011  . SOB (shortness of breath) 10/02/2011  . BPH with urinary obstruction 07/17/2010  . Diabetes mellitus type 2, controlled ([place]) 07/17/2010  . Bilateral lower extremity edema 12/08/2008  . NEUROPATHY, IDIOPATHIC PERIPHERAL NEC 05/31/2007  . Osteoarthritis 03/22/2007  . Gout 03/15/2007  . Essential hypertension 03/15/2007    Bjorn Loser, PTA  08/02/18, 12:55 PM [facility] [address] [place], [place], [address] Phone: [phone]   Fax:  [phone]  Name: Franklin Hicks MRN: [id number] Date of Birth: 02/01/1951

## 2018-08-06 ENCOUNTER — Ambulatory Visit: Payer: Medicare Other | Admitting: Rehabilitative and Restorative Service Providers"

## 2018-08-06 ENCOUNTER — Ambulatory Visit: Payer: Medicare Other | Admitting: Occupational Therapy

## 2018-08-11 ENCOUNTER — Encounter: Payer: Self-pay | Admitting: Physical Therapy

## 2018-08-11 ENCOUNTER — Ambulatory Visit: Payer: Medicare Other | Admitting: Physical Therapy

## 2018-08-11 ENCOUNTER — Ambulatory Visit: Payer: Medicare Other | Admitting: Occupational Therapy

## 2018-08-11 DIAGNOSIS — R2681 Unsteadiness on feet: Secondary | ICD-10-CM

## 2018-08-11 DIAGNOSIS — M6281 Muscle weakness (generalized): Secondary | ICD-10-CM

## 2018-08-11 DIAGNOSIS — R2689 Other abnormalities of gait and mobility: Secondary | ICD-10-CM

## 2018-08-11 DIAGNOSIS — M25612 Stiffness of left shoulder, not elsewhere classified: Secondary | ICD-10-CM

## 2018-08-11 DIAGNOSIS — M25611 Stiffness of right shoulder, not elsewhere classified: Secondary | ICD-10-CM

## 2018-08-11 DIAGNOSIS — R208 Other disturbances of skin sensation: Secondary | ICD-10-CM

## 2018-08-11 NOTE — Therapy (Signed)
[facility] [address] [place], [place], [address] Phone: [phone]   Fax:  [phone]  Occupational Therapy Treatment  Patient Details  Name: Franklin Hicks MRN: [id number] Date of Birth: [birth year] Referring Provider (OT): Jerrilyn Cairo Date: 08/11/2018  OT End of Session - 08/11/18 1324    Visit Number  11    Number of Visits  18    Date for OT Re-Evaluation  09/11/18    Authorization Type  Medicare    Authorization - Visit Number  11    Authorization - Number of Visits  20    OT Start Time  1230    OT Stop Time  1315    OT Time Calculation (min)  45 min    Activity Tolerance  Patient tolerated treatment well    Behavior During Therapy  [facility] for tasks assessed/performed       Past Medical History:  Diagnosis Date  . Arthritis    "knees" (12/17/2017)  . CAD S/P percutaneous coronary angioplasty 09/20/2016   Nstemi 08/2016. Dr. Algie Coffer. DES- brilinta and asa. Requests change to [facility] cardiology; 95% Ramus -> PCI Resolute Onyx DES 2.75 x 18  . Central cord syndrome (H[facility]) 12/17/2017  . Chronic combined systolic and diastolic heart failure (H[facility]) 10/09/2016   EF 45% and grade II diastolic after nstemi  . Diet-controlled diabetes mellitus ([facility])   . DJD (degenerative joint disease)   . Gout   . High cholesterol   . Hypertension   . NSTEMI (non-ST elevated myocardial infarction) ([facility]) 09/20/2016   95% Ramus - > PCI   . Obesity   . Recurrent pulmonary embolism ([facility]) 11/'14; 4/'19   a) Bilateral segmental and subsegmental pulmonary emboli with mild RV Strain.; b) after fall with C-spine Fxr --> Acute PE of right main pulmonary artery extending into multiple segments.    Past Surgical History:  Procedure Laterality Date  . CARDIAC CATHETERIZATION N/A 09/19/2016   Procedure: Left Heart Cath and Coronary Angiography;  Surgeon: Orpah Cobb, MD;  Location: [facility] INVASIVE CV LAB;  Service: Cardiovascular: 95% proximal  Ramus Intermedius --> PCI  . CARDIAC CATHETERIZATION N/A 09/19/2016   Procedure: Coronary Stent Intervention;  Surgeon: Yvonne Kendall, MD;  Location: [facility] INVASIVE CV LAB;  Service: Cardiovascular: 95% ramus intermedius;  Resolute Onyx 2.75 x 18 mm drug-eluting stent  . CYSTOSCOPY/RETROGRADE/URETEROSCOPY/STONE EXTRACTION WITH BASKET  7564,3329   ureteral stone   . MENISCUS REPAIR Left    knee open meniscetomy  . TRANSTHORACIC ECHOCARDIOGRAM  2004   no lvh nl ejection fraction  . TRANSTHORACIC ECHOCARDIOGRAM  09/19/2016   In setting of NSTEMI:  EF 45-50% with diffuse hypokinesis. GR 2 DD. Mild biatrial enlargement.    There were no vitals filed for this visit.  Subjective Assessment - 08/11/18 1323    Subjective   I've gone to the bathroom a few times now with just my walker (doorframe too narrow for w/c)     Pertinent History  C4 SCI (central cord) tetraplegia 12/13/17 from fall in tub    Currently in Pain?  No/denies       Self care: simulated using bathroom requiring side stepping w/ walker through narrow doorway, then transferring to chair and simulated donning/doffing pants over hips. Pt able to do I'ly.  Bed mobility (simulated way pt would get in/out of regular bed) sit to supine and back to sitting - pt able to perform I'ly  Neuro Re-education in supine for BUE's - reviewed  proper movement of scapula and humerus during shoulder flexion - pt able to perform to midrange RUE w/ min facilitation after practice, and high range LUE. Progressed to seated - pt able to perform functional reaching low level RUE, and mid range LUE (approx 70*)                      OT Short Term Goals - 07/30/18 1055      OT SHORT TERM GOAL #1   Title  Patient will complete a home exercise program designed to improve AROM in Bilateral shoulders with min assist (due 07/07/18)    Time  4    Period  Weeks    Status  Achieved      OT SHORT TERM GOAL #2   Title  Patient will complete a  stand step transfer to commode with mod assist    Time  4    Period  Weeks    Status  Achieved      OT SHORT TERM GOAL #3   Title  Patient will demonstrate static stand with min assist with upper extremities out of support for 1 minute    Time  4    Period  Weeks    Status  Achieved      OT SHORT TERM GOAL #4   Title  Patient will transition from supine to sit with mod assist in preparation for return to regular (vs hospital) bed    Time  4    Period  Weeks    Status  Achieved      OT SHORT TERM GOAL #5   Title  Patient will begin graduated driving program - first will attempt to operate car in empty lot with another adult driver.      Time  4    Period  Weeks    Status  Achieved        OT Long Term Goals - 07/30/18 1056      OT LONG TERM GOAL #1   Title  Patient will complete an upgraded HEP designed to improve BUE strength and functional range of motion (due 09/11/18)    Time  8    Period  Weeks    Status  On-going      OT LONG TERM GOAL #2   Title  Patient will be modified independnet with toilet hygiene    Time  8    Period  Weeks    Status  On-going      OT LONG TERM GOAL #3   Title  Patient will complete shower using tub transfer bench and min assist    Time  8    Period  Weeks    Status  On-going      OT LONG TERM GOAL #4   Title  Patient will demonstrate adequate dynamic stand balance tolerance (5 min) to assist with simple cooking or grilling task    Time  8    Period  Weeks    Status  Achieved      OT LONG TERM GOAL #5   Title  Patient will demonstrate ability to transition from sit to supine, supine to sit, and roll himself in regular bed in preparation for d/c HOSPITAL BED.      Time  8    Period  Weeks    Status  On-going      OT LONG TERM GOAL #6   Title  Patient will return to driving  Time  8    Period  Weeks    Status  Achieved            Plan - 08/11/18 1324    Clinical Impression Statement  Pt progressing towards goals.  Renewal completed today to continue working on ongoing/unmet LTG's.     Occupational Profile and client history currently impacting functional performance  Husband, father, grandfather, retired Air traffic controllerHS teacher, enjoys watching sports, water exercise class    Occupational performance deficits (Please refer to evaluation for details):  ADL's;IADL's;Rest and Sleep;Leisure    Rehab Potential  Good    OT Frequency  2x / week    OT Duration  4 weeks   or 1x/wk for 8 weeks beginning 08/11/18   OT Treatment/Interventions  Self-care/ADL training;Aquatic Therapy;Therapeutic exercise;Functional Mobility Training;Balance training;Manual Therapy;Neuromuscular education;Therapeutic activities;Energy conservation;DME and/or AE instruction;Moist Heat;Cryotherapy;Patient/family education    Plan  standing balance including one hand release for functional tasks, continue shoulder movement low ranges RUE, mid ranges LUE     Consulted and Agree with Plan of Care  Patient       Patient will benefit from skilled therapeutic intervention in order to improve the following deficits and impairments:  Decreased skin integrity, Decreased knowledge of precautions, Improper body mechanics, Decreased activity tolerance, Decreased knowledge of use of DME, Decreased strength, Impaired flexibility, Decreased balance, Decreased mobility, Difficulty walking, Impaired sensation, Obesity, Decreased range of motion, Increased edema, Pain, Decreased coordination, Impaired UE functional use  Visit Diagnosis: Unsteadiness on feet  Stiffness of right shoulder, not elsewhere classified  Stiffness of left shoulder, not elsewhere classified    Problem List Patient Active Problem List   Diagnosis Date Noted  . Recurrent pulmonary embolism (HCC)   . Pain of left heel   . C4 spinal cord injury, sequela (HCC) 12/18/2017  . Tetraplegia (HCC) 12/18/2017  . Aortic aneurysm (HCC) 12/16/2017  . Cord compression (HCC) 12/13/2017  . Chronic  combined systolic and diastolic heart failure (HCC) 10/09/2016  . CAD S/P percutaneous coronary angioplasty 09/20/2016  . Erectile dysfunction 07/30/2015  . Morbid obesity with BMI of 50.0-59.9, adult (HCC) 07/18/2013  . History of pulmonary embolism 07/13/2013  . Angioedema of lips 07/13/2013  . Multinodular goiter 07/13/2013  . Dyslipidemia, goal LDL below 70 10/02/2011  . SOB (shortness of breath) 10/02/2011  . BPH with urinary obstruction 07/17/2010  . Diabetes mellitus type 2, controlled (HCC) 07/17/2010  . Bilateral lower extremity edema 12/08/2008  . NEUROPATHY, IDIOPATHIC PERIPHERAL NEC 05/31/2007  . Osteoarthritis 03/22/2007  . Gout 03/15/2007  . Essential hypertension 03/15/2007    Kelli ChurnBallie, Camdyn Beske Johnson, OTR/L 08/11/2018, 1:28 PM  [facility] [address] [place], [place], [address] Phone: [phone]   Fax:  [phone]  Name: Berna SpareCarl J Zeman MRN: [id number] Date of Birth: 09/08/1950

## 2018-08-11 NOTE — Therapy (Signed)
[facility] [address] [place], [place], [address] Phone: [phone]   Fax:  [phone]  Physical Therapy Treatment  Patient Details  Name: Franklin Hicks MRN: [id number] Date of Birth: Nov 14, 1950 Referring Provider (PT): Alger Simons, MD   Encounter Date: 08/11/2018  PT End of Session - 08/11/18 1238    Visit Number  15    Number of Visits  25    Date for PT Re-Evaluation  08/24/18    Authorization Type  $20 copay [employer] medicare    PT Start Time  1152    PT Stop Time  1230    PT Time Calculation (min)  38 min    Behavior During Therapy  [facility] for tasks assessed/performed       Past Medical History:  Diagnosis Date  . Arthritis    "knees" (12/17/2017)  . CAD S/P percutaneous coronary angioplasty 09/20/2016   Nstemi 08/2016. Dr. Doylene Canard. DES- brilinta and asa. Requests change to [facility] cardiology; 95% Ramus -> PCI Resolute Onyx DES 2.75 x 18  . Central cord syndrome ([place]) 12/17/2017  . Chronic combined systolic and diastolic heart failure ([place]) 10/09/2016   EF 45% and grade II diastolic after nstemi  . Diet-controlled diabetes mellitus ([place])   . DJD (degenerative joint disease)   . Gout   . High cholesterol   . Hypertension   . NSTEMI (non-ST elevated myocardial infarction) ([place]) 09/20/2016   95% Ramus - > PCI   . Obesity   . Recurrent pulmonary embolism ([place]) 11/'14; 4/'19   a) Bilateral segmental and subsegmental pulmonary emboli with mild RV Strain.; b) after fall with C-spine Fxr --> Acute PE of right main pulmonary artery extending into multiple segments.    Past Surgical History:  Procedure Laterality Date  . CARDIAC CATHETERIZATION N/A 09/19/2016   Procedure: Left Heart Cath and Coronary Angiography;  Surgeon: Dixie Dials, MD;  Location: [place] CV LAB;  Service: Cardiovascular: 95% proximal Ramus Intermedius --> PCI  . CARDIAC CATHETERIZATION N/A 09/19/2016   Procedure: Coronary Stent Intervention;   Surgeon: Nelva Bush, MD;  Location: [place] CV LAB;  Service: Cardiovascular: 95% ramus intermedius;  Resolute Onyx 2.75 x 18 mm drug-eluting stent  . CYSTOSCOPY/RETROGRADE/URETEROSCOPY/STONE EXTRACTION WITH BASKET  3016,0109   ureteral stone   . MENISCUS REPAIR Left    knee open meniscetomy  . TRANSTHORACIC ECHOCARDIOGRAM  2004   no lvh nl ejection fraction  . TRANSTHORACIC ECHOCARDIOGRAM  09/19/2016   In setting of NSTEMI:  EF 45-50% with diffuse hypokinesis. GR 2 DD. Mild biatrial enlargement.    There were no vitals filed for this visit.  Subjective Assessment - 08/11/18 1152    Subjective  Pt has been walking in and out of the  bathroom, working on standing, working on stretching and working on tolerating less incline of bed.    Patient is accompained by:  Family member    Pertinent History  history of CAD with chronic combined CHF, T2DM, morbid obesity, hypertension, PE in the past; venous stasis ulcers.    Patient Stated Goals  Returning to greatest level of independence.  (prior status was short distance walking with 2 canes).    Currently in Pain?  No/denies    Pain Onset  More than a month ago                       [facility] Adult PT Treatment/Exercise - 08/11/18 0001      Ambulation/Gait  Ambulation/Gait  Yes    Ambulation/Gait Assistance  5: Supervision    Ambulation/Gait Assistance Details  instructed for greater R toe off to aid with clearance during swing phase    Ambulation Distance (Feet)  115 Feet    Assistive device  Rolling walker    Gait Pattern  Decreased stance time - left;Decreased step length - right;Decreased step length - left;Poor foot clearance - left;Poor foot clearance - right;Wide base of support;Trunk flexed;Shuffle      Lumbar Exercises: Stretches   Single Knee to Chest Stretch  Right;Left;2 reps;60 seconds   extending opposite knee with quad set.   Lower Trunk Rotation  2 reps;60 seconds      Knee/Hip Exercises: Stretches    Active Hamstring Stretch  Left;Right;2 reps;30 seconds             PT Education - 08/11/18 1237    Education Details  HEP for LE stretching.  Discussed mobility and stretching activities pt is doing at home.    Person(s) Educated  Patient    Methods  Explanation;Demonstration;Verbal cues;Handout    Comprehension  Verbalized understanding;Verbal cues required;Need further instruction       PT Short Term Goals - 07/30/18 1231      PT SHORT TERM GOAL #1   Title  The patient will be indep with progression of HEP.    Baseline  12/6 met    Time  4    Period  Weeks    Status  Achieved      PT SHORT TERM GOAL #2   Title  The patient will ambulate in the home with RW mod indep with short distances (x 50 ft) per report.    Baseline  Pt can walked this distance without w/c following but reports most times wife follows with w/c.    Time  4    Period  Weeks    Status  Partially Met      PT SHORT TERM GOAL #3   Title  The patient will be able to return to sitting on living room furniture and moving sit>stand to [place] from his chair in home.    Baseline  Pt can sit<>stand from dining room chairs but living room furniture is still to low.  Pt continues to sit in w/c in home at this time.    Time  4    Period  Weeks    Status  Partially Met      PT SHORT TERM GOAL #4   Title  The patient will ambulate x 200 ft nonstop with RW and supervision.    Baseline  160 feet without seated rest break with RW at [place].    Time  4    Period  Weeks    Status  Not Met      PT SHORT TERM GOAL #5   Title  The patient will imrpove gait speed from 0.37 ft/sec to > or equal to 0.6 ft/sec to demo improving functional mobility.    Baseline  0.6 ft/sec with RW.    Time  4    Period  Weeks    Status  Achieved        PT Long Term Goals - 05/26/18 1747      PT LONG TERM GOAL #1   Title  The patient will be indep with progression of HEP for post d/c strengthening and mobility. (LTG due date  08/24/2018)    Time  12    Period  Weeks  Target Date  08/24/18      PT LONG TERM GOAL #2   Title  The patient will be indep with bed mobility in standard bed (not relying on hospital bed).    Time  12    Period  Weeks    Target Date  08/24/18      PT LONG TERM GOAL #3   Title  The patient will ambulate x 400 ft with RW mod indep for household and short distance community mobility.    Time  12    Period  Weeks    Target Date  08/24/18      PT LONG TERM GOAL #4   Title  Further gait speed goal to follow.    Time  12    Period  Weeks    Target Date  08/24/18      PT LONG TERM GOAL #5   Title  Stair goal to follow, when appropriate    Time  12    Period  Weeks    Target Date  08/24/18            Plan - 08/11/18 1238    Clinical Impression Statement  Pt is making gradual progress with mobility at home, reporting walking in/out of the bathroom and kitchen without the w/c following all the time, functionally standing more, and consistently stretching LEs at home.  Skilled session focused on LE stretches and adding to HEP and gait with RW.  Pt able to demonstrate greater left foot clearance with instruction for mechanics for terminal stance phase and swing phase.                                                                                    PT Treatment/Interventions  ADLs/Self Care Home Management;Balance training;Neuromuscular re-education;Patient/family education;Gait training;Stair training;Functional mobility training;DME Instruction;Therapeutic activities;Therapeutic exercise    PT Next Visit Plan  gait in // bars with goal to return to using bil canes (pt's goal) ? add other Standing ther ex to HEP?, Gait to increase distnace, sci-fit, knee /hip/ gastroc stretching.    Consulted and Agree with Plan of Care  Patient       Patient will benefit from skilled therapeutic intervention in order to improve the following deficits and impairments:  Abnormal gait, Impaired  sensation, Obesity, Decreased activity tolerance, Decreased balance, Decreased strength, Pain, Decreased mobility, Difficulty walking, Decreased range of motion, Impaired flexibility, Postural dysfunction  Visit Diagnosis: Muscle weakness (generalized)  Other abnormalities of gait and mobility  Unsteadiness on feet     Problem List Patient Active Problem List   Diagnosis Date Noted  . Recurrent pulmonary embolism ([place])   . Pain of left heel   . C4 spinal cord injury, sequela ([place]) 12/18/2017  . Tetraplegia ([place]) 12/18/2017  . Aortic aneurysm ([place]) 12/16/2017  . Cord compression ([place]) 12/13/2017  . Chronic combined systolic and diastolic heart failure ([place]) 10/09/2016  . CAD S/P percutaneous coronary angioplasty 09/20/2016  . Erectile dysfunction 07/30/2015  . Morbid obesity with BMI of 50.0-59.9, adult ([place]) 07/18/2013  . History of pulmonary embolism 07/13/2013  . Angioedema of lips 07/13/2013  . Multinodular goiter 07/13/2013  . Dyslipidemia,  goal LDL below 70 10/02/2011  . SOB (shortness of breath) 10/02/2011  . BPH with urinary obstruction 07/17/2010  . Diabetes mellitus type 2, controlled ([place]) 07/17/2010  . Bilateral lower extremity edema 12/08/2008  . NEUROPATHY, IDIOPATHIC PERIPHERAL NEC 05/31/2007  . Osteoarthritis 03/22/2007  . Gout 03/15/2007  . Essential hypertension 03/15/2007    Bjorn Loser, PTA  08/11/18, 12:51 PM [facility] [address] [place], [place], [address] Phone: [phone]   Fax:  [phone]  Name: JALEEL ALLEN MRN: [id number] Date of Birth: 1950-12-11

## 2018-08-11 NOTE — Patient Instructions (Signed)
Access Code: [email or web address]  URL: [email or web address]  Date: 08/11/2018  Prepared by: Hortencia ConradiKarissa Juquan Reznick   Exercises Single Knee to Chest Stretch - 1 reps - 2 sets - 60 seconds hold - 1-2x daily - 7x weekly Supine Lower Trunk Rotation - 2 reps - 2 sets - 60 seconds hold - 1-2x daily - 7x weekly Seated Hamstring Stretch - 12 reps - 2 sets - 60 seconds hold - 1-2x daily - 7x weekly

## 2018-08-12 ENCOUNTER — Other Ambulatory Visit: Payer: Self-pay | Admitting: Family Medicine

## 2018-08-13 ENCOUNTER — Encounter: Payer: Self-pay | Admitting: Rehabilitative and Restorative Service Providers"

## 2018-08-13 ENCOUNTER — Ambulatory Visit: Payer: Medicare Other | Admitting: Rehabilitative and Restorative Service Providers"

## 2018-08-13 ENCOUNTER — Ambulatory Visit: Payer: Medicare Other | Admitting: Occupational Therapy

## 2018-08-13 ENCOUNTER — Encounter: Payer: Self-pay | Admitting: Occupational Therapy

## 2018-08-13 DIAGNOSIS — M6281 Muscle weakness (generalized): Secondary | ICD-10-CM | POA: Diagnosis not present

## 2018-08-13 DIAGNOSIS — M25611 Stiffness of right shoulder, not elsewhere classified: Secondary | ICD-10-CM

## 2018-08-13 DIAGNOSIS — R2681 Unsteadiness on feet: Secondary | ICD-10-CM

## 2018-08-13 DIAGNOSIS — R208 Other disturbances of skin sensation: Secondary | ICD-10-CM

## 2018-08-13 DIAGNOSIS — M25612 Stiffness of left shoulder, not elsewhere classified: Secondary | ICD-10-CM

## 2018-08-13 DIAGNOSIS — R2689 Other abnormalities of gait and mobility: Secondary | ICD-10-CM

## 2018-08-13 NOTE — Therapy (Signed)
[facility] [address] [place], [place], [address] Phone: [phone]   Fax:  [phone]  Occupational Therapy Treatment  Patient Details  Name: Franklin Hicks MRN: [id number] Date of Birth: 08/10/1951 Referring Provider (OT): Jerrilyn CairoSwartz   Encounter Date: 08/13/2018  OT End of Session - 08/13/18 1303    Visit Number  12    Number of Visits  18    Date for OT Re-Evaluation  09/11/18    Authorization Type  Medicare    Authorization - Visit Number  12    Authorization - Number of Visits  20    OT Start Time  1145    OT Stop Time  1228    OT Time Calculation (min)  43 min    Activity Tolerance  Patient tolerated treatment well    Behavior During Therapy  [facility] for tasks assessed/performed       Past Medical History:  Diagnosis Date  . Arthritis    "knees" (12/17/2017)  . CAD S/P percutaneous coronary angioplasty 09/20/2016   Nstemi 08/2016. Dr. Algie CofferKadakia. DES- brilinta and asa. [facility]; 95% Ramus -> PCI Resolute Onyx DES 2.75 x 18  . Central cord syndrome ([facility]) 12/17/2017  . Chronic combined systolic and diastolic heart failure (H[facility]) 10/09/2016   EF 45% and grade II diastolic after nstemi  . Diet-controlled diabetes mellitus ([facility])   . DJD (degenerative joint disease)   . Gout   . High cholesterol   . Hypertension   . NSTEMI (non-ST elevated myocardial infarction) ([facility]) 09/20/2016   95% Ramus - > PCI   . Obesity   . Recurrent pulmonary embolism ([facility]) 11/'14; 4/'19   a) Bilateral segmental and subsegmental pulmonary emboli with mild RV Strain.; b) after fall with C-spine Fxr --> Acute PE of right main pulmonary artery extending into multiple segments.    Past Surgical History:  Procedure Laterality Date  . CARDIAC CATHETERIZATION N/A 09/19/2016   Procedure: Left Heart Cath and Coronary Angiography;  Surgeon: Orpah CobbAjay Kadakia, MD;  Location: [facility] IN[facility] LAB;  Service: Cardiovascular: 95% proximal  Ramus Intermedius --> PCI  . CARDIAC CATHETERIZATION N/A 09/19/2016   Procedure: Coronary Stent Intervention;  Surgeon: Yvonne Kendallhristopher End, MD;  [facility];  Service: Cardiovascular: 95% ramus intermedius;  Resolute Onyx 2.75 x 18 mm drug-eluting stent  . CYSTOSCOPY/RETROGRADE/URETEROSCOPY/STONE EXTRACTION WITH BASKET  6962,95282007,1999   ureteral stone   . MENISCUS REPAIR Left    knee open meniscetomy  . TRANSTHORACIC ECHOCARDIOGRAM  2004   no lvh nl ejection fraction  . TRANSTHORACIC ECHOCARDIOGRAM  09/19/2016   In setting of NSTEMI:  EF 45-50% with diffuse hypokinesis. GR 2 DD. Mild biatrial enlargement.    There were no vitals filed for this visit.  Subjective Assessment - 08/13/18 1253    Subjective   I have not showered since I have been home.  My wife is very nervous.  Patient's accident occured in the shower.      Pertinent History  C4 SCI (central cord) tetraplegia 12/13/17 from fall in tub    Currently in Pain?  No/denies    Pain Score  0-No pain                   OT Treatments/Exercises (OP) - 08/13/18 0001      ADLs   Toileting  Worked on simulated toileting in the night if sleeping in his own bed.  Patient has narrow space between bed and  wall, and would need to side step to nd of bed to use the bathroom at night.  Goal is to return to his own bed, and discontinue hospital bed.  Worked on transitioning from sitting to supine, to rolling both directions.  Patient can complete without assistance - with increased time.      Bathing  Reviewed transfer to tu transfer bench - patient able to reach feet while seated on bench without assistance.  Patient able to verbalize fear of showering - strongly encouraged to try a "dry run" with his wife to quell their fears and prewpare them for this task.               OT Education - 08/13/18 1303    Education Details  reviewed tub and toilet transfers    Person(s) Educated  Patient    Methods   Explanation;Demonstration    Comprehension  Verbalized understanding;Returned demonstration;Need further instruction       OT Short Term Goals - 07/30/18 1055      OT SHORT TERM GOAL #1   Title  Patient will complete a home exercise program designed to improve AROM in Bilateral shoulders with min assist (due 07/07/18)    Time  4    Period  Weeks    Status  Achieved      OT SHORT TERM GOAL #2   Title  Patient will complete a stand step transfer to commode with mod assist    Time  4    Period  Weeks    Status  Achieved      OT SHORT TERM GOAL #3   Title  Patient will demonstrate static stand with min assist with upper extremities out of support for 1 minute    Time  4    Period  Weeks    Status  Achieved      OT SHORT TERM GOAL #4   Title  Patient will transition from supine to sit with mod assist in preparation for return to regular (vs hospital) bed    Time  4    Period  Weeks    Status  Achieved      OT SHORT TERM GOAL #5   Title  Patient will begin graduated driving program - first will attempt to operate car in empty lot with another adult driver.      Time  4    Period  Weeks    Status  Achieved        OT Long Term Goals - 08/13/18 1308      OT LONG TERM GOAL #1   Title  Patient will complete an upgraded HEP designed to improve BUE strength and functional range of motion (due 09/11/18)    Time  8    Period  Weeks    Status  On-going      OT LONG TERM GOAL #2   Title  Patient will be modified independnet with toilet hygiene    Time  8    Period  Weeks    Status  On-going      OT LONG TERM GOAL #3   Title  Patient will complete shower using tub transfer bench and min assist    Time  8    Period  Weeks    Status  On-going      OT LONG TERM GOAL #4   Title  Patient will demonstrate adequate dynamic stand balance tolerance (5 min) to assist with simple cooking or grilling  task    Time  8    Period  Weeks    Status  Achieved      OT LONG TERM GOAL #5    Title  Patient will demonstrate ability to transition from sit to supine, supine to sit, and roll himself in regular bed in preparation for d/c HOSPITAL BED.      Time  8    Period  Weeks    Status  Achieved      OT LONG TERM GOAL #6   Title  Patient will return to driving    Time  8    Period  Weeks    Status  Achieved            Plan - 08/13/18 1307    Clinical Impression Statement  Patient has shown significant improvement with functional tasks simulated in clinic - needs continued emphasis on carrying these tasks over to home setting.     Occupational Profile and client history currently impacting functional performance  Husband, father, grandfather, retired Air traffic controller, enjoys watching sports, water exercise class    Occupational performance deficits (Please refer to evaluation for details):  ADL's;IADL's;Rest and Sleep;Leisure    Rehab Potential  Good    Current Impairments/barriers affecting progress:  obesity    OT Frequency  2x / week    OT Duration  4 weeks    OT Treatment/Interventions  Self-care/ADL training;Aquatic Therapy;Therapeutic exercise;Functional Mobility Training;Balance training;Manual Therapy;Neuromuscular education;Therapeutic activities;Energy conservation;DME and/or AE instruction;Moist Heat;Cryotherapy;Patient/family education    Plan  standing balance including one hand release for functional tasks, continue shoulder movement low ranges RUE, mid ranges LUE     Clinical Decision Making  Several treatment options, min-mod task modification necessary    OT Home Exercise Plan  Initiated supine Bilateral shoulder exercises    Consulted and Agree with Plan of Care  Patient       Patient will benefit from skilled therapeutic intervention in order to improve the following deficits and impairments:  Decreased skin integrity, Decreased knowledge of precautions, Improper body mechanics, Decreased activity tolerance, Decreased knowledge of use of DME, Decreased  strength, Impaired flexibility, Decreased balance, Decreased mobility, Difficulty walking, Impaired sensation, Obesity, Decreased range of motion, Increased edema, Pain, Decreased coordination, Impaired UE functional use  Visit Diagnosis: Muscle weakness (generalized)  Other disturbances of skin sensation  Stiffness of left shoulder, not elsewhere classified  Stiffness of right shoulder, not elsewhere classified  Unsteadiness on feet    Problem List Patient Active Problem List   Diagnosis Date Noted  . Recurrent pulmonary embolism ([facility])   . Pain of left heel   . C4 spinal cord injury, sequela ([facility]) 12/18/2017  . Tetraplegia ([facility]) 12/18/2017  . Aortic aneurysm ([facility]) 12/16/2017  . Cord compression ([facility]) 12/13/2017  . Chronic combined systolic and diastolic heart failure ([facility]) 10/09/2016  . CAD S/P percutaneous coronary angioplasty 09/20/2016  . Erectile dysfunction 07/30/2015  . Morbid obesity with BMI of 50.0-59.9, adult ([facility]) 07/18/2013  . History of pulmonary embolism 07/13/2013  . Angioedema of lips 07/13/2013  . Multinodular goiter 07/13/2013  . Dyslipidemia, goal LDL below 70 10/02/2011  . SOB (shortness of breath) 10/02/2011  . BPH with urinary obstruction 07/17/2010  . Diabetes mellitus type 2, controlled ([facility]) 07/17/2010  . Bilateral lower extremity edema 12/08/2008  . NEUROPATHY, IDIOPATHIC PERIPHERAL NEC 05/31/2007  . Osteoarthritis 03/22/2007  . Gout 03/15/2007  . Essential hypertension 03/15/2007    Collier Salina, OTR/L 08/13/2018, 1:09 PM  [place]  [facility] [address] [place], [place], [address] Phone: [phone]   Fax:  [phone]  Name: Franklin Hicks MRN: [id number] Date of Birth: 02/13/1951

## 2018-08-13 NOTE — Therapy (Signed)
[facility] [address] [place], [place], [address] Phone: [phone]   Fax:  [phone]  Physical Therapy Treatment  Patient Details  Name: Franklin Hicks MRN: [id number] Date of Birth: June 28, 1951 Referring Provider (PT): Alger Simons, MD   Encounter Date: 08/13/2018  PT End of Session - 08/13/18 1110    Visit Number  16    Number of Visits  25    Date for PT Re-Evaluation  08/24/18    Authorization Type  $20 copay [employer] medicare    PT Start Time  1106    PT Stop Time  1146    PT Time Calculation (min)  40 min    Behavior During Therapy  [facility] for tasks assessed/performed       Past Medical History:  Diagnosis Date  . Arthritis    "knees" (12/17/2017)  . CAD S/P percutaneous coronary angioplasty 09/20/2016   Nstemi 08/2016. Dr. Doylene Canard. DES- brilinta and asa. Requests change to [facility] cardiology; 95% Ramus -> PCI Resolute Onyx DES 2.75 x 18  . Central cord syndrome ([place]) 12/17/2017  . Chronic combined systolic and diastolic heart failure ([place]) 10/09/2016   EF 45% and grade II diastolic after nstemi  . Diet-controlled diabetes mellitus ([place])   . DJD (degenerative joint disease)   . Gout   . High cholesterol   . Hypertension   . NSTEMI (non-ST elevated myocardial infarction) ([place]) 09/20/2016   95% Ramus - > PCI   . Obesity   . Recurrent pulmonary embolism ([place]) 11/'14; 4/'19   a) Bilateral segmental and subsegmental pulmonary emboli with mild RV Strain.; b) after fall with C-spine Fxr --> Acute PE of right main pulmonary artery extending into multiple segments.    Past Surgical History:  Procedure Laterality Date  . CARDIAC CATHETERIZATION N/A 09/19/2016   Procedure: Left Heart Cath and Coronary Angiography;  Surgeon: Dixie Dials, MD;  Location: [place] CV [facility];  Service: Cardiovascular: 95% proximal Ramus Intermedius --> PCI  . CARDIAC CATHETERIZATION N/A 09/19/2016   Procedure: Coronary Stent Intervention;   Surgeon: Nelva Bush, MD;  Location: [place] [facility];  Service: Cardiovascular: 95% ramus intermedius;  Resolute Onyx 2.75 x 18 mm drug-eluting stent  . CYSTOSCOPY/RETROGRADE/URETEROSCOPY/STONE EXTRACTION WITH BASKET  4888,9169   ureteral stone   . MENISCUS REPAIR Left    knee open meniscetomy  . TRANSTHORACIC ECHOCARDIOGRAM  2004   no lvh nl ejection fraction  . TRANSTHORACIC ECHOCARDIOGRAM  09/19/2016   In setting of NSTEMI:  EF 45-50% with diffuse hypokinesis. GR 2 DD. Mild biatrial enlargement.    There were no vitals filed for this visit.  Subjective Assessment - 08/13/18 1109    Subjective  The patient reports he rides to the store, but does not go in.  "It's more mind over matter."  The patient is still sleeping in the hospital bed.  He keeps a urinal near the bed.  He is not having  to use the bedside commode.   He would have to walk to the bathroom in the middle of the night.      Pertinent History  history of CAD with chronic combined CHF, T2DM, morbid obesity, hypertension, PE in the past; venous stasis ulcers.    Patient Stated Goals  Returning to greatest level of independence.  (prior status was short distance walking with 2 canes).    Currently in Pain?  No/denies  [facility] Adult PT Treatment/Exercise - 08/13/18 1523      Transfers   Transfers  Sit to Stand;Stand to Sit    Sit to Stand  6: Modified independent (Device/Increase time)    Sit to Stand Details (indicate cue type and reason)  PT, OT and patient (at end of PT session, beginning of OT) worked on sit<>stand from low seat to mimic home chair/couch.      Stand to Sit  6: Modified independent (Device/Increase time)      Ambulation/Gait   Ambulation/Gait  Yes    Ambulation/Gait Assistance  5: Supervision    Ambulation/Gait Assistance Details  worked on side stepping with walker to mimic walking between bed and wall as patient is unsure if walker will fit beside bed *he is  using hospital bed at this time.    Ambulation Distance (Feet)  40 Feet   x 3   Assistive device  Rolling walker    Gait Pattern  Decreased stance time - left;Decreased step length - right;Decreased step length - left;Poor foot clearance - left;Poor foot clearance - right;Wide base of support;Trunk flexed;Shuffle      Self-Care   Self-Care  Other Self-Care Comments    Other Self-Care Comments   Discussed barriers to getting back to his bed for nighttime sleep and recommended some strategies of : try to move from bed to bathroom with RW during the day to ensure safe, discuss or consider switching sides of the bed with his wife if needed, trialing RW beside bed to see if he will need to sidestep between bed and wall (he notes it is a tighter space).  Discussed getting out of wheelchair more frequently and walking more at home.  Recommended the patient stop using w/c in the house in stages.  He notes barrier of slippery kitchen floor in slippers.  The patient and PT discussed wearing shoes indoors or getting rubber soled slippers.              PT Education - 08/13/18 1522    Education Details  problem solved some ADLs for home; discussed dec'ing dependence on w/c and hospital bed    Person(s) Educated  Patient    Methods  Explanation    Comprehension  Verbalized understanding       PT Short Term Goals - 07/30/18 1231      PT SHORT TERM GOAL #1   Title  The patient will be indep with progression of HEP.    Baseline  12/6 met    Time  4    Period  Weeks    Status  Achieved      PT SHORT TERM GOAL #2   Title  The patient will ambulate in the home with RW mod indep with short distances (x 50 ft) per report.    Baseline  Pt can walked this distance without w/c following but reports most times wife follows with w/c.    Time  4    Period  Weeks    Status  Partially Met      PT SHORT TERM GOAL #3   Title  The patient will be able to return to sitting on living room furniture and  moving sit>stand to [place] from his chair in home.    Baseline  Pt can sit<>stand from dining room chairs but living room furniture is still to low.  Pt continues to sit in w/c in home at this time.    Time  4  Period  Weeks    Status  Partially Met      PT SHORT TERM GOAL #4   Title  The patient will ambulate x 200 ft nonstop with RW and supervision.    Baseline  160 feet without seated rest break with RW at [place].    Time  4    Period  Weeks    Status  Not Met      PT SHORT TERM GOAL #5   Title  The patient will imrpove gait speed from 0.37 ft/sec to > or equal to 0.6 ft/sec to demo improving functional mobility.    Baseline  0.6 ft/sec with RW.    Time  4    Period  Weeks    Status  Achieved        PT Long Term Goals - 05/26/18 1747      PT LONG TERM GOAL #1   Title  The patient will be indep with progression of HEP for post d/c strengthening and mobility. (LTG due date 08/24/2018)    Time  12    Period  Weeks    Target Date  08/24/18      PT LONG TERM GOAL #2   Title  The patient will be indep with bed mobility in standard bed (not relying on hospital bed).    Time  12    Period  Weeks    Target Date  08/24/18      PT LONG TERM GOAL #3   Title  The patient will ambulate x 400 ft with RW mod indep for household and short distance community mobility.    Time  12    Period  Weeks    Target Date  08/24/18      PT LONG TERM GOAL #4   Title  Further gait speed goal to follow.    Time  12    Period  Weeks    Target Date  08/24/18      PT LONG TERM GOAL #5   Title  Stair goal to follow, when appropriate    Time  12    Period  Weeks    Target Date  08/24/18            Plan - 08/13/18 1528    Clinical Impression Statement  The patient is able to begin to more aggressively transition from w/c to more walking at home.  We also discussed getting out of hospital bed to sleep in his bed and sitting on the couch in the living room.  The patient was able to get up  from a low seat in therapy (box with w/c cushion on top) to practice.  PT to check LTGs, anticipating renewal x 60 more days as long as patient will continue to progress his activity level at home.     PT Treatment/Interventions  ADLs/Self Care Home Management;Balance training;Neuromuscular re-education;Patient/family education;Gait training;Stair training;Functional mobility training;DME Instruction;Therapeutic activities;Therapeutic exercise    PT Next Visit Plan  CHECK LONG TERM GOALS *PT TO RENEW X 60 DAYS* gait in // bars with goal to return to using bil canes (pt's goal) ? add other Standing ther ex to HEP?, Gait to increase distnace, sci-fit, knee /hip/ gastroc stretching.    Consulted and Agree with Plan of Care  Patient       Patient will benefit from skilled therapeutic intervention in order to improve the following deficits and impairments:  Abnormal gait, Impaired sensation, Obesity, Decreased activity tolerance, Decreased balance,  Decreased strength, Pain, Decreased mobility, Difficulty walking, Decreased range of motion, Impaired flexibility, Postural dysfunction  Visit Diagnosis: Unsteadiness on feet  Other abnormalities of gait and mobility     Problem List Patient Active Problem List   Diagnosis Date Noted  . Recurrent pulmonary embolism ([place])   . Pain of left heel   . C4 spinal cord injury, sequela ([place]) 12/18/2017  . Tetraplegia ([place]) 12/18/2017  . Aortic aneurysm ([place]) 12/16/2017  . Cord compression ([place]) 12/13/2017  . Chronic combined systolic and diastolic heart failure ([place]) 10/09/2016  . CAD S/P percutaneous coronary angioplasty 09/20/2016  . Erectile dysfunction 07/30/2015  . Morbid obesity with BMI of 50.0-59.9, adult ([place]) 07/18/2013  . History of pulmonary embolism 07/13/2013  . Angioedema of lips 07/13/2013  . Multinodular goiter 07/13/2013  . Dyslipidemia, goal LDL below 70 10/02/2011  . SOB (shortness of breath) 10/02/2011  . BPH with urinary  obstruction 07/17/2010  . Diabetes mellitus type 2, controlled ([place]) 07/17/2010  . Bilateral lower extremity edema 12/08/2008  . NEUROPATHY, IDIOPATHIC PERIPHERAL NEC 05/31/2007  . Osteoarthritis 03/22/2007  . Gout 03/15/2007  . Essential hypertension 03/15/2007    Oluchi Pucci , PT 08/13/2018, 3:30 PM  [facility] [address] [place], [place], [address] Phone: [phone]   Fax:  [phone]  Name: GERY SABEDRA MRN: [id number] Date of Birth: April 21, 1951

## 2018-08-16 ENCOUNTER — Ambulatory Visit: Payer: Medicare Other | Admitting: Occupational Therapy

## 2018-08-16 ENCOUNTER — Ambulatory Visit: Payer: Medicare Other | Admitting: Physical Therapy

## 2018-08-19 ENCOUNTER — Ambulatory Visit: Payer: Medicare Other | Admitting: Rehabilitative and Restorative Service Providers"

## 2018-08-19 ENCOUNTER — Encounter: Payer: Self-pay | Admitting: Rehabilitative and Restorative Service Providers"

## 2018-08-19 DIAGNOSIS — R2681 Unsteadiness on feet: Secondary | ICD-10-CM

## 2018-08-19 DIAGNOSIS — M6281 Muscle weakness (generalized): Secondary | ICD-10-CM | POA: Diagnosis not present

## 2018-08-19 DIAGNOSIS — R2689 Other abnormalities of gait and mobility: Secondary | ICD-10-CM

## 2018-08-19 NOTE — Therapy (Signed)
[facility] [address] [place], [place], [address] Phone: [phone]   Fax:  [phone]  Physical Therapy Treatment  Patient Details  Name: Franklin Hicks MRN: [id number] Date of Birth: 1951/06/08 Referring Provider (PT): Alger Simons, MD   Encounter Date: 08/19/2018  PT End of Session - 08/19/18 1517    Visit Number  17    Number of Visits  25    Date for PT Re-Evaluation  08/24/18    Authorization Type  $20 copay [employer] medicare    PT Start Time  1318    PT Stop Time  1405    PT Time Calculation (min)  47 min    Activity Tolerance  Patient tolerated treatment well    Behavior During Therapy  [facility] for tasks assessed/performed       Past Medical History:  Diagnosis Date  . Arthritis    "knees" (12/17/2017)  . CAD S/P percutaneous coronary angioplasty 09/20/2016   Nstemi 08/2016. Dr. Doylene Canard. DES- brilinta and asa. Requests change to [facility] cardiology; 95% Ramus -> PCI Resolute Onyx DES 2.75 x 18  . Central cord syndrome ([place]) 12/17/2017  . Chronic combined systolic and diastolic heart failure ([place]) 10/09/2016   EF 45% and grade II diastolic after nstemi  . Diet-controlled diabetes mellitus ([place])   . DJD (degenerative joint disease)   . Gout   . High cholesterol   . Hypertension   . NSTEMI (non-ST elevated myocardial infarction) ([place]) 09/20/2016   95% Ramus - > PCI   . Obesity   . Recurrent pulmonary embolism ([place]) 11/'14; 4/'19   a) Bilateral segmental and subsegmental pulmonary emboli with mild RV Strain.; b) after fall with C-spine Fxr --> Acute PE of right main pulmonary artery extending into multiple segments.    Past Surgical History:  Procedure Laterality Date  . CARDIAC CATHETERIZATION N/A 09/19/2016   Procedure: Left Heart Cath and Coronary Angiography;  Surgeon: Dixie Dials, MD;  Location: [facility];  Service: Cardiovascular: 95% proximal Ramus Intermedius --> PCI  . CARDIAC CATHETERIZATION N/A  09/19/2016   Procedure: Coronary Stent Intervention;  Surgeon: Nelva Bush, MD;  Location: [facility];  Service: Cardiovascular: 95% ramus intermedius;  Resolute Onyx 2.75 x 18 mm drug-eluting stent  . CYSTOSCOPY/RETROGRADE/URETEROSCOPY/STONE EXTRACTION WITH BASKET  9163,8466   ureteral stone   . MENISCUS REPAIR Left    knee open meniscetomy  . TRANSTHORACIC ECHOCARDIOGRAM  2004   no lvh nl ejection fraction  . TRANSTHORACIC ECHOCARDIOGRAM  09/19/2016   In setting of NSTEMI:  EF 45-50% with diffuse hypokinesis. GR 2 DD. Mild biatrial enlargement.    There were no vitals filed for this visit.  Subjective Assessment - 08/19/18 1327    Subjective  The patient slept in his own bed last night because his hospital bed was not raising up and down.    He has not tried to sit on the couch yet.    Pertinent History  history of CAD with chronic combined CHF, T2DM, morbid obesity, hypertension, PE in the past; venous stasis ulcers.    Patient Stated Goals  Returning to greatest level of independence.  (prior status was short distance walking with 2 canes).    Currently in Pain?  No/denies                       [facility] Adult PT Treatment/Exercise - 08/19/18 1332      Transfers   Transfers  Sit to Stand;Stand to Sit    Sit to Stand  6: Modified independent (Device/Increase time)    Stand to Sit  6: Modified independent (Device/Increase time)      Ambulation/Gait   Ambulation/Gait  Yes    Ambulation/Gait Assistance  6: Modified independent (Device/Increase time)    Ambulation/Gait Assistance Details  PT met patient in lobby with rolling walker and patient walked into clinic and out of clinic today.     Ambulation Distance (Feet)  240 Feet    Assistive device  Rolling walker    Gait Pattern  Decreased stance time - left;Decreased step length - right;Decreased step length - left;Poor foot clearance - left;Poor foot clearance - right;Wide base of support;Trunk flexed;Shuffle       Self-Care   Self-Care  Other Self-Care Comments    Other Self-Care Comments   Discussed goals for next 30 days:  walking into clinic, sitting on couch insteadof w/c, walking more in the home.       Neuro Re-ed    Neuro Re-ed Details   Standing balance dec'ing support through RW.  Reaching overhead, alternating shoulder flexion.      Exercises   Exercises  Other Exercises    Other Exercises   Sit<>stand working on upright posture and dec'ing support through UEs                PT Short Term Goals - 07/30/18 1231      PT SHORT TERM GOAL #1   Title  The patient will be indep with progression of HEP.    Baseline  12/6 met    Time  4    Period  Weeks    Status  Achieved      PT SHORT TERM GOAL #2   Title  The patient will ambulate in the home with RW mod indep with short distances (x 50 ft) per report.    Baseline  Pt can walked this distance without w/c following but reports most times wife follows with w/c.    Time  4    Period  Weeks    Status  Partially Met      PT SHORT TERM GOAL #3   Title  The patient will be able to return to sitting on living room furniture and moving sit>stand to [place] from his chair in home.    Baseline  Pt can sit<>stand from dining room chairs but living room furniture is still to low.  Pt continues to sit in w/c in home at this time.    Time  4    Period  Weeks    Status  Partially Met      PT SHORT TERM GOAL #4   Title  The patient will ambulate x 200 ft nonstop with RW and supervision.    Baseline  160 feet without seated rest break with RW at [place].    Time  4    Period  Weeks    Status  Not Met      PT SHORT TERM GOAL #5   Title  The patient will imrpove gait speed from 0.37 ft/sec to > or equal to 0.6 ft/sec to demo improving functional mobility.    Baseline  0.6 ft/sec with RW.    Time  4    Period  Weeks    Status  Achieved        PT Long Term Goals - 08/19/18 1331      PT LONG TERM GOAL #  1   Title  The patient  will be indep with progression of HEP for post d/c strengthening and mobility. (LTG due date 08/24/2018)    Time  12    Period  Weeks      PT LONG TERM GOAL #2   Title  The patient will be indep with bed mobility in standard bed (not relying on hospital bed).    Time  12    Period  Weeks    Status  Achieved      PT LONG TERM GOAL #3   Title  The patient will ambulate x 400 ft with RW mod indep for household and short distance community mobility.    Baseline  Patient ambulated 240 feet nonstop with RW mod indep.     Time  12    Period  Weeks    Status  Partially Met      PT LONG TERM GOAL #4   Title  Further gait speed goal to follow.    Baseline  0.67 ft/sec.    Time  12    Period  Weeks    Status  Achieved      PT LONG TERM GOAL #5   Title  Stair goal to follow, when appropriate    Time  12    Period  Weeks    Status  On-going            Plan - 08/19/18 1524    Clinical Impression Statement  The patient partially met LTG for gait distance.    He met LTG for bed mobility and met LTG for assessing gait speed.  PT to renew next week to continue x 60 more days.  Plan to update goals and plan of care.      PT Treatment/Interventions  ADLs/Self Care Home Management;Balance training;Neuromuscular re-education;Patient/family education;Gait training;Stair training;Functional mobility training;DME Instruction;Therapeutic activities;Therapeutic exercise    PT Next Visit Plan  CHECK LONG TERM GOALS *PT TO RENEW X 60 DAYS* gait in // bars with goal to return to using bil canes (pt's goal) ? add other Standing ther ex to HEP?, Gait to increase distnace, sci-fit, knee /hip/ gastroc stretching.    Consulted and Agree with Plan of Care  Patient       Patient will benefit from skilled therapeutic intervention in order to improve the following deficits and impairments:  Abnormal gait, Impaired sensation, Obesity, Decreased activity tolerance, Decreased balance, Decreased strength, Pain,  Decreased mobility, Difficulty walking, Decreased range of motion, Impaired flexibility, Postural dysfunction  Visit Diagnosis: Muscle weakness (generalized)  Unsteadiness on feet  Other abnormalities of gait and mobility     Problem List Patient Active Problem List   Diagnosis Date Noted  . Recurrent pulmonary embolism ([place])   . Pain of left heel   . C4 spinal cord injury, sequela ([place]) 12/18/2017  . Tetraplegia ([place]) 12/18/2017  . Aortic aneurysm ([place]) 12/16/2017  . Cord compression ([place]) 12/13/2017  . Chronic combined systolic and diastolic heart failure ([place]) 10/09/2016  . CAD S/P percutaneous coronary angioplasty 09/20/2016  . Erectile dysfunction 07/30/2015  . Morbid obesity with BMI of 50.0-59.9, adult ([place]) 07/18/2013  . History of pulmonary embolism 07/13/2013  . Angioedema of lips 07/13/2013  . Multinodular goiter 07/13/2013  . Dyslipidemia, goal LDL below 70 10/02/2011  . SOB (shortness of breath) 10/02/2011  . BPH with urinary obstruction 07/17/2010  . Diabetes mellitus type 2, controlled ([place]) 07/17/2010  . Bilateral lower extremity edema 12/08/2008  . NEUROPATHY, IDIOPATHIC  PERIPHERAL NEC 05/31/2007  . Osteoarthritis 03/22/2007  . Gout 03/15/2007  . Essential hypertension 03/15/2007    Acey Woodfield , PT 08/19/2018, 3:26 PM  [facility] [address] [place], [place], [address] Phone: [phone]   Fax:  [phone]  Name: Franklin Hicks MRN: [id number] Date of Birth: 05-31-1951

## 2018-08-23 ENCOUNTER — Ambulatory Visit: Payer: Medicare Other | Admitting: Physical Therapy

## 2018-08-23 ENCOUNTER — Ambulatory Visit: Payer: Medicare Other | Admitting: Occupational Therapy

## 2018-08-26 ENCOUNTER — Encounter: Payer: Self-pay | Admitting: Rehabilitative and Restorative Service Providers"

## 2018-08-26 ENCOUNTER — Ambulatory Visit
Payer: Medicare Other | Attending: Physical Medicine & Rehabilitation | Admitting: Rehabilitative and Restorative Service Providers"

## 2018-08-26 ENCOUNTER — Ambulatory Visit: Payer: Medicare Other | Admitting: Occupational Therapy

## 2018-08-26 DIAGNOSIS — S14129S Central cord syndrome at unspecified level of cervical spinal cord, sequela: Secondary | ICD-10-CM | POA: Insufficient documentation

## 2018-08-26 DIAGNOSIS — M25612 Stiffness of left shoulder, not elsewhere classified: Secondary | ICD-10-CM

## 2018-08-26 DIAGNOSIS — M25611 Stiffness of right shoulder, not elsewhere classified: Secondary | ICD-10-CM | POA: Diagnosis present

## 2018-08-26 DIAGNOSIS — M6281 Muscle weakness (generalized): Secondary | ICD-10-CM | POA: Diagnosis present

## 2018-08-26 DIAGNOSIS — R2681 Unsteadiness on feet: Secondary | ICD-10-CM | POA: Diagnosis present

## 2018-08-26 DIAGNOSIS — R2689 Other abnormalities of gait and mobility: Secondary | ICD-10-CM | POA: Diagnosis present

## 2018-08-26 DIAGNOSIS — R208 Other disturbances of skin sensation: Secondary | ICD-10-CM | POA: Insufficient documentation

## 2018-08-26 NOTE — Therapy (Signed)
[facility] [address] [place], [place], [address] Phone: [phone]   Fax:  [phone]  Occupational Therapy Treatment  Patient Details  Name: Franklin Hicks MRN: [id number] Date of Birth: [birth year] Referring Provider (OT): Jerrilyn Cairo Date: 08/26/2018  OT End of Session - 08/26/18 1616    Visit Number  13    Number of Visits  18    Date for OT Re-Evaluation  09/11/18    Authorization Type  Medicare    Authorization - Visit Number  13    Authorization - Number of Visits  20    OT Start Time  1400    OT Stop Time  1445    OT Time Calculation (min)  45 min    Activity Tolerance  Patient tolerated treatment well    Behavior During Therapy  [facility] for tasks assessed/performed       Past Medical History:  Diagnosis Date  . Arthritis    "knees" (12/17/2017)  . CAD S/P percutaneous coronary angioplasty 09/20/2016   Nstemi 08/2016. Dr. Algie Coffer. DES- brilinta and asa. Requests change to [facility] cardiology; 95% Ramus -> PCI Resolute Onyx DES 2.75 x 18  . Central cord syndrome (HCC) 12/17/2017  . Chronic combined systolic and diastolic heart failure (HCC) 10/09/2016   EF 45% and grade II diastolic after nstemi  . Diet-controlled diabetes mellitus (HCC)   . DJD (degenerative joint disease)   . Gout   . High cholesterol   . Hypertension   . NSTEMI (non-ST elevated myocardial infarction) (HCC) 09/20/2016   95% Ramus - > PCI   . Obesity   . Recurrent pulmonary embolism (HCC) 11/'14; 4/'19   a) Bilateral segmental and subsegmental pulmonary emboli with mild RV Strain.; b) after fall with C-spine Fxr --> Acute PE of right main pulmonary artery extending into multiple segments.    Past Surgical History:  Procedure Laterality Date  . CARDIAC CATHETERIZATION N/A 09/19/2016   Procedure: Left Heart Cath and Coronary Angiography;  Surgeon: Orpah Cobb, MD;  Location: [facility] INVASIVE CV LAB;  Service: Cardiovascular: 95% proximal  Ramus Intermedius --> PCI  . CARDIAC CATHETERIZATION N/A 09/19/2016   Procedure: Coronary Stent Intervention;  Surgeon: Yvonne Kendall, MD;  Location: [facility] INVASIVE CV LAB;  Service: Cardiovascular: 95% ramus intermedius;  Resolute Onyx 2.75 x 18 mm drug-eluting stent  . CYSTOSCOPY/RETROGRADE/URETEROSCOPY/STONE EXTRACTION WITH BASKET  6962,9528   ureteral stone   . MENISCUS REPAIR Left    knee open meniscetomy  . TRANSTHORACIC ECHOCARDIOGRAM  2004   no lvh nl ejection fraction  . TRANSTHORACIC ECHOCARDIOGRAM  09/19/2016   In setting of NSTEMI:  EF 45-50% with diffuse hypokinesis. GR 2 DD. Mild biatrial enlargement.    There were no vitals filed for this visit.  Subjective Assessment - 08/26/18 1407    Subjective   I'm still scared to go in the shower    Pertinent History  C4 SCI (central cord) tetraplegia 12/13/17 from fall in tub    Currently in Pain?  Yes    Pain Location  Generalized   Knees and shoulders   Pain Orientation  Right;Left    Pain Descriptors / Indicators  Aching    Pain Type  Chronic pain    Pain Onset  More than a month ago    Pain Frequency  Intermittent    Aggravating Factors   rainy, cold days    Pain Relieving Factors  gentle movement  Self care: Discussed tub transfer using heavy duty tub transfer bench. Re-emphasizes doing "dry run". Also discussed possible need for grab bar and where to place to assist with sit to stand.   Standing at countertop working on posture, following by disengaging RUE, then LUE. Sidestepping to RT/Lt and practiced retrieving items from lower cabinets RUE, and midrange cabinets LUE                     OT Short Term Goals - 07/30/18 1055      OT SHORT TERM GOAL #1   Title  Patient will complete a home exercise program designed to improve AROM in Bilateral shoulders with min assist (due 07/07/18)    Time  4    Period  Weeks    Status  Achieved      OT SHORT TERM GOAL #2   Title  Patient will complete a  stand step transfer to commode with mod assist    Time  4    Period  Weeks    Status  Achieved      OT SHORT TERM GOAL #3   Title  Patient will demonstrate static stand with min assist with upper extremities out of support for 1 minute    Time  4    Period  Weeks    Status  Achieved      OT SHORT TERM GOAL #4   Title  Patient will transition from supine to sit with mod assist in preparation for return to regular (vs hospital) bed    Time  4    Period  Weeks    Status  Achieved      OT SHORT TERM GOAL #5   Title  Patient will begin graduated driving program - first will attempt to operate car in empty lot with another adult driver.      Time  4    Period  Weeks    Status  Achieved        OT Long Term Goals - 08/13/18 1308      OT LONG TERM GOAL #1   Title  Patient will complete an upgraded HEP designed to improve BUE strength and functional range of motion (due 09/11/18)    Time  8    Period  Weeks    Status  On-going      OT LONG TERM GOAL #2   Title  Patient will be modified independnet with toilet hygiene    Time  8    Period  Weeks    Status  On-going      OT LONG TERM GOAL #3   Title  Patient will complete shower using tub transfer bench and min assist    Time  8    Period  Weeks    Status  On-going      OT LONG TERM GOAL #4   Title  Patient will demonstrate adequate dynamic stand balance tolerance (5 min) to assist with simple cooking or grilling task    Time  8    Period  Weeks    Status  Achieved      OT LONG TERM GOAL #5   Title  Patient will demonstrate ability to transition from sit to supine, supine to sit, and roll himself in regular bed in preparation for d/c HOSPITAL BED.      Time  8    Period  Weeks    Status  Achieved      OT  LONG TERM GOAL #6   Title  Patient will return to driving    Time  8    Period  Weeks    Status  Achieved            Plan - 08/26/18 1617    Clinical Impression Statement  Pt continues to make progress in  clinic. Pt still very reluctant to try tub transfer w/ DME at home    Occupational Profile and client history currently impacting functional performance  Husband, father, grandfather, retired Air traffic controllerHS teacher, enjoys watching sports, water exercise class    Occupational performance deficits (Please refer to evaluation for details):  ADL's;IADL's;Rest and Sleep;Leisure    Rehab Potential  Good    Current Impairments/barriers affecting progress:  obesity    OT Frequency  2x / week    OT Duration  4 weeks    OT Treatment/Interventions  Self-care/ADL training;Aquatic Therapy;Therapeutic exercise;Functional Mobility Training;Balance training;Manual Therapy;Neuromuscular education;Therapeutic activities;Energy conservation;DME and/or AE instruction;Moist Heat;Cryotherapy;Patient/family education    Plan  try prone on elbows for hip flexor stretch, posture, and wt bearing through elbows, standing balance including one hand release for functional tasks, continue shoulder movement low ranges RUE, mid ranges LUE     Consulted and Agree with Plan of Care  Patient       Patient will benefit from skilled therapeutic intervention in order to improve the following deficits and impairments:  Decreased skin integrity, Decreased knowledge of precautions, Improper body mechanics, Decreased activity tolerance, Decreased knowledge of use of DME, Decreased strength, Impaired flexibility, Decreased balance, Decreased mobility, Difficulty walking, Impaired sensation, Obesity, Decreased range of motion, Increased edema, Pain, Decreased coordination, Impaired UE functional use  Visit Diagnosis: Unsteadiness on feet  Stiffness of left shoulder, not elsewhere classified  Stiffness of right shoulder, not elsewhere classified    Problem List Patient Active Problem List   Diagnosis Date Noted  . Recurrent pulmonary embolism (HCC)   . Pain of left heel   . C4 spinal cord injury, sequela (HCC) 12/18/2017  . Tetraplegia (HCC)  12/18/2017  . Aortic aneurysm (HCC) 12/16/2017  . Cord compression (HCC) 12/13/2017  . Chronic combined systolic and diastolic heart failure (HCC) 10/09/2016  . CAD S/P percutaneous coronary angioplasty 09/20/2016  . Erectile dysfunction 07/30/2015  . Morbid obesity with BMI of 50.0-59.9, adult (HCC) 07/18/2013  . History of pulmonary embolism 07/13/2013  . Angioedema of lips 07/13/2013  . Multinodular goiter 07/13/2013  . Dyslipidemia, goal LDL below 70 10/02/2011  . SOB (shortness of breath) 10/02/2011  . BPH with urinary obstruction 07/17/2010  . Diabetes mellitus type 2, controlled (HCC) 07/17/2010  . Bilateral lower extremity edema 12/08/2008  . NEUROPATHY, IDIOPATHIC PERIPHERAL NEC 05/31/2007  . Osteoarthritis 03/22/2007  . Gout 03/15/2007  . Essential hypertension 03/15/2007    Kelli ChurnBallie, Yates Weisgerber Johnson, OTR/L 08/26/2018, 4:18 PM  [facility] [address] [place], [place], [address] Phone: [phone]   Fax:  [phone]  Name: Franklin Hicks MRN: [id number] Date of Birth: 01/18/1951

## 2018-08-26 NOTE — Therapy (Signed)
[facility] [address] [place], [place], [address] Phone: [phone]   Fax:  [phone]  Physical Therapy Treatment  Patient Details  Name: Franklin Hicks MRN: [id number] Date of Birth: [birth year] Referring Provider (PT): Alger Simons, MD   Encounter Date: 08/26/2018  PT End of Session - 08/26/18 1518    Visit Number  18    Number of Visits  25    Date for PT Re-Evaluation  08/24/18    Authorization Type  $20 copay [facility] medicare    Activity Tolerance  Patient tolerated treatment well    Behavior During Therapy  [facility] for tasks assessed/performed     start time:  1326-1400 (34 minutes)  Past Medical History:  Diagnosis Date  . Arthritis    "knees" (12/17/2017)  . CAD S/P percutaneous coronary angioplasty 09/20/2016   Nstemi 08/2016. Dr. Doylene Canard. DES- brilinta and asa. Requests change to [facility] cardiology; 95% Ramus -> PCI Resolute Onyx DES 2.75 x 18  . Central cord syndrome ([place]) 12/17/2017  . Chronic combined systolic and diastolic heart failure ([place]) 10/09/2016   EF 45% and grade II diastolic after nstemi  . Diet-controlled diabetes mellitus ([place])   . DJD (degenerative joint disease)   . Gout   . High cholesterol   . Hypertension   . NSTEMI (non-ST elevated myocardial infarction) ([place]) 09/20/2016   95% Ramus - > PCI   . Obesity   . Recurrent pulmonary embolism ([place]) 11/'14; 4/'19   a) Bilateral segmental and subsegmental pulmonary emboli with mild RV Strain.; b) after fall with C-spine Fxr --> Acute PE of right main pulmonary artery extending into multiple segments.    Past Surgical History:  Procedure Laterality Date  . CARDIAC CATHETERIZATION N/A 09/19/2016   Procedure: Left Heart Cath and Coronary Angiography;  Surgeon: Dixie Dials, MD;  Location: [facility] CV LAB;  Service: Cardiovascular: 95% proximal Ramus Intermedius --> PCI  . CARDIAC CATHETERIZATION N/A 09/19/2016   Procedure: Coronary Stent  Intervention;  Surgeon: Nelva Bush, MD;  Location: [place] CV LAB;  Service: Cardiovascular: 95% ramus intermedius;  Resolute Onyx 2.75 x 18 mm drug-eluting stent  . CYSTOSCOPY/RETROGRADE/URETEROSCOPY/STONE EXTRACTION WITH BASKET  7282,0601   ureteral stone   . MENISCUS REPAIR Left    knee open meniscetomy  . TRANSTHORACIC ECHOCARDIOGRAM  2004   no lvh nl ejection fraction  . TRANSTHORACIC ECHOCARDIOGRAM  09/19/2016   In setting of NSTEMI:  EF 45-50% with diffuse hypokinesis. GR 2 DD. Mild biatrial enlargement.    There were no vitals filed for this visit.  Subjective Assessment - 08/26/18 1326    Subjective  The patient is sleeping in his bed every night.  He is walking from his room to the master bedroom.  He hasn't walked all the way from the master bedroom to the living area, but he feels sure he can.     Pertinent History  history of CAD with chronic combined CHF, T2DM, morbid obesity, hypertension, PE in the past; venous stasis ulcers.    Patient Stated Goals  Returning to greatest level of independence.  (prior status was short distance walking with 2 canes).    Currently in Pain?  Yes    Pain Location  Generalized    Pain Descriptors / Indicators  Aching    Pain Onset  More than a month ago    Pain Frequency  Intermittent    Aggravating Factors   rainy day    Pain Relieving Factors  gentle movement         [facility] PT Assessment - 08/26/18 1352      Ambulation/Gait   Ambulation Distance (Feet)  65 Feet   x 2 reps   Assistive device  Rolling walker    Gait Pattern  Decreased stance time - left;Decreased step length - right;Decreased step length - left;Poor foot clearance - left;Poor foot clearance - right;Wide base of support;Trunk flexed;Shuffle    Ambulation Surface  Level;Indoor    Gait velocity  0.61 ft/sec    at end of session after doing curb/ ramp   Stairs  Yes    Stair Management Technique  Two rails;Step to pattern    Ramp  --   CGA with cues on walker  placement and technique   Curb  --   CGA with cueson walker placement   Gait Comments  Attempted steps with bilateral handrails and patient was able to put the left foot on bottom step, but could not load his left leg to step up.  Then tried to move R leg up to 6" surface and was no able.  He felt increased weakness                    [facility] Adult PT Treatment/Exercise - 08/26/18 1352      Ambulation/Gait   Ambulation/Gait  Yes    Ambulation/Gait Assistance  6: Modified independent (Device/Increase time)    Ambulation/Gait Assistance Details  Patient ambulated 60 feet x 2 reps.      Self-Care   Self-Care  Other Self-Care Comments    Other Self-Care Comments   reviewed recommendation to sit on couch at home to reduce dependence on w/c               PT Short Term Goals - 07/30/18 1231      PT SHORT TERM GOAL #1   Title  The patient will be indep with progression of HEP.    Baseline  12/6 met    Time  4    Period  Weeks    Status  Achieved      PT SHORT TERM GOAL #2   Title  The patient will ambulate in the home with RW mod indep with short distances (x 50 ft) per report.    Baseline  Pt can walked this distance without w/c following but reports most times wife follows with w/c.    Time  4    Period  Weeks    Status  Partially Met      PT SHORT TERM GOAL #3   Title  The patient will be able to return to sitting on living room furniture and moving sit>stand to [place] from his chair in home.    Baseline  Pt can sit<>stand from dining room chairs but living room furniture is still to low.  Pt continues to sit in w/c in home at this time.    Time  4    Period  Weeks    Status  Partially Met      PT SHORT TERM GOAL #4   Title  The patient will ambulate x 200 ft nonstop with RW and supervision.    Baseline  160 feet without seated rest break with RW at [place].    Time  4    Period  Weeks    Status  Not Met      PT SHORT TERM GOAL #5   Title  The patient  will  imrpove gait speed from 0.37 ft/sec to > or equal to 0.6 ft/sec to demo improving functional mobility.    Baseline  0.6 ft/sec with RW.    Time  4    Period  Weeks    Status  Achieved        PT Long Term Goals - 08/26/18 1329      PT LONG TERM GOAL #1   Title  The patient will be indep with progression of HEP for post d/c strengthening and mobility. (LTG due date 08/24/2018)    Baseline  focused more on walking more and not so much on specific HEP.     Time  12    Period  Weeks    Status  Not Met      PT LONG TERM GOAL #2   Title  The patient will be indep with bed mobility in standard bed (not relying on hospital bed).    Time  12    Period  Weeks    Status  Achieved      PT LONG TERM GOAL #3   Title  The patient will ambulate x 400 ft with RW mod indep for household and short distance community mobility.    Baseline  Patient ambulated 240 feet nonstop with RW mod indep.     Time  12    Period  Weeks    Status  Partially Met      PT LONG TERM GOAL #4   Title  Further gait speed goal to follow.    Baseline  0.67 ft/sec.    Time  12    Period  Weeks    Status  Achieved      PT LONG TERM GOAL #5   Title  Stair goal to follow, when appropriate    Baseline  assessed stairs and patient is unable to lift right leg to lead up onto steps.  He was able to do curb.    Time  12    Period  Weeks    Status  Achieved       UPDATED SHORT AND LONG TERM GOALS:    PT Short Term Goals - 08/26/18 1523      PT SHORT TERM GOAL #1   Title  The patient will be indep with standing HEP near countertop.    Time  4    Period  Weeks    Status  New    Target Date  09/25/18      PT SHORT TERM GOAL #2   Title  The patient will ambulate x 350 ft nonstop to demo increasing ambulation distance for short community surface negotiation mod indep with RW.    Baseline  240 ft with RW    Time  4    Period  Weeks    Status  New    Target Date  09/25/18      PT SHORT TERM GOAL #3    Title  The patient will be able to return to sitting on living room furniture and moving sit>stand to [place] from his chair in home.    Time  4    Period  Weeks    Status  New    Target Date  09/25/18      PT SHORT TERM GOAL #4   Title  The patient will negotiate 4 steps with bilateral handrail and min A.    Baseline  The patient was unable to load leg to step up to bottom  step, but could do a curb with CGA.    Time  4    Period  Weeks    Status  New    Target Date  09/25/18      PT SHORT TERM GOAL #5   Title  Improve gait speed from 0.61 ft/sec (on 08/26/18) up to 0.85 ft/sec to demo incrasing mobility.    Time  4    Period  Weeks    Status  New    Target Date  09/25/18      PT Long Term Goals - 08/26/18 1525      PT LONG TERM GOAL #1   Title  The patient will be indep with progression of HEP for post d/c strengthening and mobility. (LTG due date 08/24/2018)    Time  8    Period  Days    Status  Revised    Target Date  10/25/18      PT LONG TERM GOAL #2   Title  The patient will ambulate x 500 ft nonstop with RW mod indep for short community surface negotiation.    Time  8    Period  Weeks    Status  New    Target Date  10/25/18      PT LONG TERM GOAL #3   Title  The patient will negotiate 4 steps with bilateral handrail or RW with supervision.    Time  8    Period  Weeks    Status  New    Target Date  10/25/18      PT LONG TERM GOAL #4   Title  The patient will improve gait speed from 0.61 ft/sec to > or equal to 1.0 ft/sec.    Time  8    Period  Weeks    Status  New    Target Date  09/25/18          Plan - 08/26/18 1519    Clinical Impression Statement  The patient met 3 LTGs, and partially met 1 LTG.  He did not meet goal for HEP as he has been focused more on walking and functional activities.    PT Treatment/Interventions  ADLs/Self Care Home Management;Balance training;Neuromuscular re-education;Patient/family education;Gait training;Stair  training;Functional mobility training;DME Instruction;Therapeutic activities;Therapeutic exercise    PT Next Visit Plan  Updated goals today. Progress gait distance, gait in parallel bars dec'ing reliance on UEs (was using canes prior to fall), standing ther ex for strengthening, gait increase in home, sci fit.    Consulted and Agree with Plan of Care  Patient     2x/week x 8 weeks.  Patient will benefit from skilled therapeutic intervention in order to improve the following deficits and impairments:  Abnormal gait, Impaired sensation, Obesity, Decreased activity tolerance, Decreased balance, Decreased strength, Pain, Decreased mobility, Difficulty walking, Decreased range of motion, Impaired flexibility, Postural dysfunction  Visit Diagnosis: Muscle weakness (generalized)  Unsteadiness on feet  Other abnormalities of gait and mobility     Problem List Patient Active Problem List   Diagnosis Date Noted  . Recurrent pulmonary embolism ([place])   . Pain of left heel   . C4 spinal cord injury, sequela ([place]) 12/18/2017  . Tetraplegia ([place]) 12/18/2017  . Aortic aneurysm ([place]) 12/16/2017  . Cord compression ([place]) 12/13/2017  . Chronic combined systolic and diastolic heart failure ([place]) 10/09/2016  . CAD S/P percutaneous coronary angioplasty 09/20/2016  . Erectile dysfunction 07/30/2015  . Morbid obesity with BMI of 50.0-59.9, adult ([place]) 07/18/2013  .  History of pulmonary embolism 07/13/2013  . Angioedema of lips 07/13/2013  . Multinodular goiter 07/13/2013  . Dyslipidemia, goal LDL below 70 10/02/2011  . SOB (shortness of breath) 10/02/2011  . BPH with urinary obstruction 07/17/2010  . Diabetes mellitus type 2, controlled ([place]) 07/17/2010  . Bilateral lower extremity edema 12/08/2008  . NEUROPATHY, IDIOPATHIC PERIPHERAL NEC 05/31/2007  . Osteoarthritis 03/22/2007  . Gout 03/15/2007  . Essential hypertension 03/15/2007    Amy Belloso 08/26/2018, 3:22 PM  [facility] [address] [place], [place], [address] Phone: [phone]   Fax:  [phone]  Name: Franklin Hicks MRN: [id number] Date of Birth: [birth year]

## 2018-08-30 ENCOUNTER — Encounter: Payer: Self-pay | Admitting: Physical Therapy

## 2018-08-30 ENCOUNTER — Ambulatory Visit: Payer: Medicare Other | Admitting: Physical Therapy

## 2018-08-30 DIAGNOSIS — M6281 Muscle weakness (generalized): Secondary | ICD-10-CM | POA: Diagnosis not present

## 2018-08-30 DIAGNOSIS — R2681 Unsteadiness on feet: Secondary | ICD-10-CM

## 2018-08-30 DIAGNOSIS — R2689 Other abnormalities of gait and mobility: Secondary | ICD-10-CM

## 2018-08-30 NOTE — Therapy (Signed)
[facility] [address] [place], [place], [address] Phone: [phone]   Fax:  [phone]  Physical Therapy Treatment  Patient Details  Name: Franklin Hicks MRN: [id number] Date of Birth: 01/11/1951 Referring Provider (PT): Faith RogueZachary Swartz, MD   Encounter Date: 08/30/2018  PT End of Session - 08/30/18 1243    Visit Number  19    Number of Visits  34    Date for PT Re-Evaluation  10/25/18    Authorization Type  $20 copay [employer] medicare    PT Start Time  1022    PT Stop Time  1103    PT Time Calculation (min)  41 min    Behavior During Therapy  [facility] for tasks assessed/performed       Past Medical History:  Diagnosis Date  . Arthritis    "knees" (12/17/2017)  . CAD S/P percutaneous coronary angioplasty 09/20/2016   Nstemi 08/2016. Dr. Algie CofferKadakia. DES- brilinta and asa. [facility]s [facility]; 95% Ramus -> PCI Resolute Onyx DES 2.75 x 18  . Central cord syndrome (HCC) 12/17/2017  . Chronic combined systolic and diastolic heart failure (HCC) 10/09/2016   EF 45% and grade II diastolic after nstemi  . Diet-controlled diabetes mellitus (HCC)   . DJD (degenerative joint disease)   . Gout   . High cholesterol   . Hypertension   . NSTEMI (non-ST elevated myocardial infarction) (HCC) 09/20/2016   95% Ramus - > PCI   . Obesity   . Recurrent pulmonary embolism (HCC) 11/'14; 4/'19   a) Bilateral segmental and subsegmental pulmonary emboli with mild RV Strain.; b) after fall with C-spine Fxr --> Acute PE of right main pulmonary artery extending into multiple segments.    Past Surgical History:  Procedure Laterality Date  . CARDIAC CATHETERIZATION N/A 09/19/2016   Procedure: Left Heart Cath and Coronary Angiography;  Surgeon: Orpah CobbAjay Kadakia, MD;  Location: [facility] IN[facility] CV LAB;  Service: Cardiovascular: 95% proximal Ramus Intermedius --> PCI  . CARDIAC CATHETERIZATION N/A 09/19/2016   Procedure: Coronary Stent Intervention;   Surgeon: Yvonne Kendallhristopher End, MD;  [facility];  Service: Cardiovascular: 95% ramus intermedius;  Resolute Onyx 2.75 x 18 mm drug-eluting stent  . CYSTOSCOPY/RETROGRADE/URETEROSCOPY/STONE EXTRACTION WITH BASKET  7253,66442007,1999   ureteral stone   . MENISCUS REPAIR Left    knee open meniscetomy  . TRANSTHORACIC ECHOCARDIOGRAM  2004   no lvh nl ejection fraction  . TRANSTHORACIC ECHOCARDIOGRAM  09/19/2016   In setting of NSTEMI:  EF 45-50% with diffuse hypokinesis. GR 2 DD. Mild biatrial enlargement.    There were no vitals filed for this visit.  Subjective Assessment - 08/30/18 1023    Patient is accompained by:  Family member    Pertinent History  history of CAD with chronic combined CHF, T2DM, morbid obesity, hypertension, PE in the past; venous stasis ulcers.    Patient Stated Goals  Returning to greatest level of independence.  (prior status was short distance walking with 2 canes).    Currently in Pain?  Yes    Pain Score  2     Pain Location  Knee    Pain Orientation  Right;Left    Pain Descriptors / Indicators  Aching    Pain Onset  More than a month ago    Pain Frequency  Intermittent                       [facility] Adult PT Treatment/Exercise -  08/30/18 0001      Ambulation/Gait   Ambulation/Gait  Yes    Pre-Gait Activities  in parallel bars: training for reciprocal LE/UE pattern to work on decreasing UE support cues for pattern:  29' x2.                             Balance Exercises - 08/30/18 1049      Balance Exercises: Standing   Standing Eyes Opened  Wide (BOA);Head turns/nods   intermittent UE support   Standing, One Foot on a Step  4 inch   alternate foot taps with bilateral UE support       PT Education - 08/30/18 1242    Education Details  Importance of consistently stretching and effects of tightness on posture in standing.    Person(s) Educated  Patient    Methods  Explanation;Demonstration;Verbal cues    Comprehension   Verbalized understanding       PT Short Term Goals - 08/26/18 1523      PT SHORT TERM GOAL #1   Title  The patient will be indep with standing HEP near countertop.    Time  4    Period  Weeks    Status  New    Target Date  09/25/18      PT SHORT TERM GOAL #2   Title  The patient will ambulate x 350 ft nonstop to demo increasing ambulation distance for short community surface negotiation mod indep with RW.    Baseline  240 ft with RW    Time  4    Period  Weeks    Status  New    Target Date  09/25/18      PT SHORT TERM GOAL #3   Title  The patient will be able to return to sitting on living room furniture and moving sit>stand to RW from his chair in home.    Time  4    Period  Weeks    Status  New    Target Date  09/25/18      PT SHORT TERM GOAL #4   Title  The patient will negotiate 4 steps with bilateral handrail and min A.    Baseline  The patient was unable to load leg to step up to bottom step, but could do a curb with CGA.    Time  4    Period  Weeks    Status  New    Target Date  09/25/18      PT SHORT TERM GOAL #5   Title  Improve gait speed from 0.61 ft/sec (on 08/26/18) up to 0.85 ft/sec to demo incrasing mobility.    Time  4    Period  Weeks    Status  New    Target Date  09/25/18        PT Long Term Goals - 08/26/18 1525      PT LONG TERM GOAL #1   Title  The patient will be indep with progression of HEP for post d/c strengthening and mobility. (LTG due date 08/24/2018)    Time  8    Period  Days    Status  Revised    Target Date  10/25/18      PT LONG TERM GOAL #2   Title  The patient will ambulate x 500 ft nonstop with RW mod indep for short community surface negotiation.    Time  8  Period  Weeks    Status  New    Target Date  10/25/18      PT LONG TERM GOAL #3   Title  The patient will negotiate 4 steps with bilateral handrail or RW with supervision.    Time  8    Period  Weeks    Status  New    Target Date  10/25/18      PT LONG  TERM GOAL #4   Title  The patient will improve gait speed from 0.61 ft/sec to > or equal to 1.0 ft/sec.    Time  8    Period  Weeks    Status  New    Target Date  09/25/18            Plan - 08/30/18 1041    Clinical Impression Statement  Skilled session focused on gait activities in parallel bars to decreased UE support, increase trunk extension, and increase steplength, standing balance, and LE strengthening.  Pt requires intermittent UE support for standing with head movements. Pt continues to have difficulty with weight shifting and maintaining stance on LLE  with R LE taps on 4" block.                                                                                       PT Frequency  2x / week    PT Duration  8 weeks    PT Treatment/Interventions  ADLs/Self Care Home Management;Balance training;Neuromuscular re-education;Patient/family education;Gait training;Stair training;Functional mobility training;DME Instruction;Therapeutic activities;Therapeutic exercise    PT Next Visit Plan  Update HEP for standing endurance/balance. Progress gait distance ( trial forearm crutches?) , gait in parallel bars dec'ing reliance on UEs (was using canes prior to fall), standing ther ex for strengthening, gait increase in home, sci fit.    Consulted and Agree with Plan of Care  Patient       Patient will benefit from skilled therapeutic intervention in order to improve the following deficits and impairments:  Abnormal gait, Impaired sensation, Obesity, Decreased activity tolerance, Decreased balance, Decreased strength, Pain, Decreased mobility, Difficulty walking, Decreased range of motion, Impaired flexibility, Postural dysfunction  Visit Diagnosis: Unsteadiness on feet  Muscle weakness (generalized)  Other abnormalities of gait and mobility     Problem List Patient Active Problem List   Diagnosis Date Noted  . Recurrent pulmonary embolism (HCC)   . Pain of left heel   . C4 spinal cord  injury, sequela (HCC) 12/18/2017  . Tetraplegia (HCC) 12/18/2017  . Aortic aneurysm (HCC) 12/16/2017  . Cord compression (HCC) 12/13/2017  . Chronic combined systolic and diastolic heart failure (HCC) 10/09/2016  . CAD S/P percutaneous coronary angioplasty 09/20/2016  . Erectile dysfunction 07/30/2015  . Morbid obesity with BMI of 50.0-59.9, adult (HCC) 07/18/2013  . History of pulmonary embolism 07/13/2013  . Angioedema of lips 07/13/2013  . Multinodular goiter 07/13/2013  . Dyslipidemia, goal LDL below 70 10/02/2011  . SOB (shortness of breath) 10/02/2011  . BPH with urinary obstruction 07/17/2010  . Diabetes mellitus type 2, controlled (HCC) 07/17/2010  . Bilateral lower extremity edema 12/08/2008  . NEUROPATHY, IDIOPATHIC PERIPHERAL NEC 05/31/2007  .  Osteoarthritis 03/22/2007  . Gout 03/15/2007  . Essential hypertension 03/15/2007    Hortencia Conradi, PTA  08/30/18, 12:49 PM [facility] [address] [place], [place], [address] Phone: [phone]   Fax:  [phone]  Name: Franklin Hicks MRN: [id number] Date of Birth: 04-18-1951

## 2018-09-01 ENCOUNTER — Encounter: Payer: Self-pay | Admitting: Physical Therapy

## 2018-09-01 ENCOUNTER — Ambulatory Visit: Payer: Medicare Other | Admitting: Occupational Therapy

## 2018-09-01 ENCOUNTER — Ambulatory Visit: Payer: Medicare Other | Admitting: Physical Therapy

## 2018-09-01 DIAGNOSIS — M6281 Muscle weakness (generalized): Secondary | ICD-10-CM | POA: Diagnosis not present

## 2018-09-01 DIAGNOSIS — R2689 Other abnormalities of gait and mobility: Secondary | ICD-10-CM

## 2018-09-01 DIAGNOSIS — R2681 Unsteadiness on feet: Secondary | ICD-10-CM

## 2018-09-01 DIAGNOSIS — M25611 Stiffness of right shoulder, not elsewhere classified: Secondary | ICD-10-CM

## 2018-09-01 DIAGNOSIS — M25612 Stiffness of left shoulder, not elsewhere classified: Secondary | ICD-10-CM

## 2018-09-01 NOTE — Therapy (Signed)
[facility] [address] [place], [place], [address] Phone: [phone]   Fax:  [phone]  Occupational Therapy Treatment  Patient Details  Name: Franklin Hicks MRN: [id number] Date of Birth: 12/09/1950 Referring Provider (OT): Jerrilyn CairoSwartz   Encounter Date: 09/01/2018  OT End of Session - 09/01/18 1128    Visit Number  14    Number of Visits  18    Date for OT Re-Evaluation  09/11/18    Authorization Type  Medicare    Authorization - Visit Number  14    Authorization - Number of Visits  20    OT Start Time  0845    OT Stop Time  0930    OT Time Calculation (min)  45 min    Activity Tolerance  Patient tolerated treatment well    Behavior During Therapy  [facility] for tasks assessed/performed       Past Medical History:  Diagnosis Date  . Arthritis    "knees" (12/17/2017)  . CAD S/P percutaneous coronary angioplasty 09/20/2016   Nstemi 08/2016. Dr. Algie CofferKadakia. DES- brilinta and asa. [facility]; 95% Ramus -> PCI Resolute Onyx DES 2.75 x 18  . Central cord syndrome (HCC) 12/17/2017  . Chronic combined systolic and diastolic heart failure (HCC) 10/09/2016   EF 45% and grade II diastolic after nstemi  . Diet-controlled diabetes mellitus (HCC)   . DJD (degenerative joint disease)   . Gout   . High cholesterol   . Hypertension   . NSTEMI (non-ST elevated myocardial infarction) (HCC) 09/20/2016   95% Ramus - > PCI   . Obesity   . Recurrent pulmonary embolism (HCC) 11/'14; 4/'19   a) Bilateral segmental and subsegmental pulmonary emboli with mild RV Strain.; b) after fall with C-spine Fxr --> Acute PE of right main pulmonary artery extending into multiple segments.    Past Surgical History:  Procedure Laterality Date  . CARDIAC CATHETERIZATION N/A 09/19/2016   Procedure: Left Heart Cath and Coronary Angiography;  Surgeon: Orpah CobbAjay Kadakia, MD;  Location: [facility] INVASIVE CV LAB;  Service: Cardiovascular: 95% proximal  Ramus Intermedius --> PCI  . CARDIAC CATHETERIZATION N/A 09/19/2016   Procedure: Coronary Stent Intervention;  Surgeon: Yvonne Kendallhristopher End, MD;  [facility];  Service: Cardiovascular: 95% ramus intermedius;  Resolute Onyx 2.75 x 18 mm drug-eluting stent  . CYSTOSCOPY/RETROGRADE/URETEROSCOPY/STONE EXTRACTION WITH BASKET  [id number],1999   ureteral stone   . MENISCUS REPAIR Left    knee open meniscetomy  . TRANSTHORACIC ECHOCARDIOGRAM  2004   no lvh nl ejection fraction  . TRANSTHORACIC ECHOCARDIOGRAM  09/19/2016   In setting of NSTEMI:  EF 45-50% with diffuse hypokinesis. GR 2 DD. Mild biatrial enlargement.    There were no vitals filed for this visit.  Subjective Assessment - 09/01/18 0856    Pertinent History  C4 SCI (central cord) tetraplegia 12/13/17 from fall in tub    Currently in Pain?  Yes    Pain Score  3     Pain Location  Knee   and Rt shoulder   Pain Orientation  Right;Left    Pain Descriptors / Indicators  Aching    Pain Type  Chronic pain    Pain Onset  More than a month ago    Pain Frequency  Intermittent    Aggravating Factors   rainy cold days    Pain Relieving Factors  gentle movement        Sit to supine then rolled over  to prone I'ly. Prone on elbows for wt bearing, sh girdle depression, hip flexor stretch, and improve posture. Pt actively engaging UE's for downward scapula depression x 5 reps, 2 sets holding 5 sec. Rolled back from prone to supine I'ly Supine: BUE sh flexion holding thick dowel (palms facing each other) for high level sh flexion and proper scapula movement Seated: AA/ROM LUE/then RUE using UE Ranger (against gravity on diagonal surface LUE, in gravity elim plane RUE)                     OT Short Term Goals - 07/30/18 1055      OT SHORT TERM GOAL #1   Title  Patient will complete a home exercise program designed to improve AROM in Bilateral shoulders with min assist (due 07/07/18)    Time  4    Period  Weeks     Status  Achieved      OT SHORT TERM GOAL #2   Title  Patient will complete a stand step transfer to commode with mod assist    Time  4    Period  Weeks    Status  Achieved      OT SHORT TERM GOAL #3   Title  Patient will demonstrate static stand with min assist with upper extremities out of support for 1 minute    Time  4    Period  Weeks    Status  Achieved      OT SHORT TERM GOAL #4   Title  Patient will transition from supine to sit with mod assist in preparation for return to regular (vs hospital) bed    Time  4    Period  Weeks    Status  Achieved      OT SHORT TERM GOAL #5   Title  Patient will begin graduated driving program - first will attempt to operate car in empty lot with another adult driver.      Time  4    Period  Weeks    Status  Achieved        OT Long Term Goals - 08/13/18 1308      OT LONG TERM GOAL #1   Title  Patient will complete an upgraded HEP designed to improve BUE strength and functional range of motion (due 09/11/18)    Time  8    Period  Weeks    Status  On-going      OT LONG TERM GOAL #2   Title  Patient will be modified independnet with toilet hygiene    Time  8    Period  Weeks    Status  On-going      OT LONG TERM GOAL #3   Title  Patient will complete shower using tub transfer bench and min assist    Time  8    Period  Weeks    Status  On-going      OT LONG TERM GOAL #4   Title  Patient will demonstrate adequate dynamic stand balance tolerance (5 min) to assist with simple cooking or grilling task    Time  8    Period  Weeks    Status  Achieved      OT LONG TERM GOAL #5   Title  Patient will demonstrate ability to transition from sit to supine, supine to sit, and roll himself in regular bed in preparation for d/c HOSPITAL BED.      Time  8  Period  Weeks    Status  Achieved      OT LONG TERM GOAL #6   Title  Patient will return to driving    Time  8    Period  Weeks    Status  Achieved            Plan -  09/01/18 1129    Clinical Impression Statement  Pt progressing with BUE ROM and bed mobility    Occupational Profile and client history currently impacting functional performance  Husband, father, grandfather, retired Air traffic controller, enjoys watching sports, water exercise class    Occupational performance deficits (Please refer to evaluation for details):  ADL's;IADL's;Rest and Sleep;Leisure    Rehab Potential  Good    OT Frequency  2x / week    OT Duration  4 weeks    OT Treatment/Interventions  Self-care/ADL training;Aquatic Therapy;Therapeutic exercise;Functional Mobility Training;Balance training;Manual Therapy;Neuromuscular education;Therapeutic activities;Energy conservation;DME and/or AE instruction;Moist Heat;Cryotherapy;Patient/family education    Plan  practice simulating tub transfer w/ bench, work on ER bilateral shoulders, standing balance/functional mobility    Consulted and Agree with Plan of Care  Patient       Patient will benefit from skilled therapeutic intervention in order to improve the following deficits and impairments:  Decreased skin integrity, Decreased knowledge of precautions, Improper body mechanics, Decreased activity tolerance, Decreased knowledge of use of DME, Decreased strength, Impaired flexibility, Decreased balance, Decreased mobility, Difficulty walking, Impaired sensation, Obesity, Decreased range of motion, Increased edema, Pain, Decreased coordination, Impaired UE functional use  Visit Diagnosis: Muscle weakness (generalized)  Stiffness of left shoulder, not elsewhere classified  Stiffness of right shoulder, not elsewhere classified    Problem List Patient Active Problem List   Diagnosis Date Noted  . Recurrent pulmonary embolism (HCC)   . Pain of left heel   . C4 spinal cord injury, sequela (HCC) 12/18/2017  . Tetraplegia (HCC) 12/18/2017  . Aortic aneurysm (HCC) 12/16/2017  . Cord compression (HCC) 12/13/2017  . Chronic combined systolic and  diastolic heart failure (HCC) 10/09/2016  . CAD S/P percutaneous coronary angioplasty 09/20/2016  . Erectile dysfunction 07/30/2015  . Morbid obesity with BMI of 50.0-59.9, adult (HCC) 07/18/2013  . History of pulmonary embolism 07/13/2013  . Angioedema of lips 07/13/2013  . Multinodular goiter 07/13/2013  . Dyslipidemia, goal LDL below 70 10/02/2011  . SOB (shortness of breath) 10/02/2011  . BPH with urinary obstruction 07/17/2010  . Diabetes mellitus type 2, controlled (HCC) 07/17/2010  . Bilateral lower extremity edema 12/08/2008  . NEUROPATHY, IDIOPATHIC PERIPHERAL NEC 05/31/2007  . Osteoarthritis 03/22/2007  . Gout 03/15/2007  . Essential hypertension 03/15/2007    Kelli Churn, OTR/L 09/01/2018, 11:30 AM  [facility] [address] [place], [place], [address] Phone: [phone]   Fax:  [phone]  Name: Franklin Hicks MRN: [id number] Date of Birth: [birth year]

## 2018-09-01 NOTE — Therapy (Addendum)
[facility] [address] [place], [place], [address] Phone: [phone]   Fax:  [phone]  Physical Therapy Treatment and 10th visit progress note  Patient Details  Name: Franklin Hicks MRN: [id number] Date of Birth: [birth year] Referring Provider (PT): Faith Rogue, MD   Encounter Date: 09/01/2018  PT End of Session - 09/01/18 1311    Visit Number  20    Number of Visits  34    Date for PT Re-Evaluation  10/25/18    Authorization Type  $20 copay [employer] medicare    PT Start Time  0935    PT Stop Time  1022    PT Time Calculation (min)  47 min    Behavior During Therapy  [facility] for tasks assessed/performed       Past Medical History:  Diagnosis Date  . Arthritis    "knees" (12/17/2017)  . CAD S/P percutaneous coronary angioplasty 09/20/2016   Nstemi 08/2016. Dr. Algie Coffer. DES- brilinta and asa. Requests change to [facility] cardiology; 95% Ramus -> PCI Resolute Onyx DES 2.75 x 18  . Central cord syndrome (HCC) 12/17/2017  . Chronic combined systolic and diastolic heart failure (HCC) 10/09/2016   EF 45% and grade II diastolic after nstemi  . Diet-controlled diabetes mellitus (HCC)   . DJD (degenerative joint disease)   . Gout   . High cholesterol   . Hypertension   . NSTEMI (non-ST elevated myocardial infarction) (HCC) 09/20/2016   95% Ramus - > PCI   . Obesity   . Recurrent pulmonary embolism (HCC) 11/'14; 4/'19   a) Bilateral segmental and subsegmental pulmonary emboli with mild RV Strain.; b) after fall with C-spine Fxr --> Acute PE of right main pulmonary artery extending into multiple segments.    Past Surgical History:  Procedure Laterality Date  . CARDIAC CATHETERIZATION N/A 09/19/2016   Procedure: Left Heart Cath and Coronary Angiography;  Surgeon: Orpah Cobb, MD;  [facility] INVASIVE CV LAB;  Service: Cardiovascular: 95% proximal Ramus Intermedius --> PCI  . CARDIAC CATHETERIZATION N/A 09/19/2016   Procedure:  Coronary Stent Intervention;  Surgeon: Yvonne Kendall, MD;  Location: [facility] INVASIVE CV LAB;  Service: Cardiovascular: 95% ramus intermedius;  Resolute Onyx 2.75 x 18 mm drug-eluting stent  . CYSTOSCOPY/RETROGRADE/URETEROSCOPY/STONE EXTRACTION WITH BASKET  1027,2536   ureteral stone   . MENISCUS REPAIR Left    knee open meniscetomy  . TRANSTHORACIC ECHOCARDIOGRAM  2004   no lvh nl ejection fraction  . TRANSTHORACIC ECHOCARDIOGRAM  09/19/2016   In setting of NSTEMI:  EF 45-50% with diffuse hypokinesis. GR 2 DD. Mild biatrial enlargement.    There were no vitals filed for this visit.  Subjective Assessment - 09/01/18 0936    Subjective  Stretched some at home.    Pertinent History  history of CAD with chronic combined CHF, T2DM, morbid obesity, hypertension, PE in the past; venous stasis ulcers.    Patient Stated Goals  Returning to greatest level of independence.  (prior status was short distance walking with 2 canes).    Currently in Pain?  Yes    Pain Score  3     Pain Location  Knee    Pain Orientation  Right;Left    Pain Descriptors / Indicators  Aching    Pain Onset  More than a month ago    Pain Frequency  Intermittent                       [facility]  Adult PT Treatment/Exercise - 09/01/18 0001      Ambulation/Gait   Ambulation/Gait  Yes    Ambulation/Gait Assistance  5: Supervision    Ambulation/Gait Assistance Details  cues for weight shifting on LLE during R swing phase.    Ambulation Distance (Feet)  100 Feet   x2   Assistive device  Rolling walker;Parallel bars    Gait Pattern  Decreased stance time - left;Decreased step length - right;Decreased step length - left;Poor foot clearance - left;Poor foot clearance - right;Wide base of support;Trunk flexed;Shuffle    Ambulation Surface  Level;Indoor      Knee/Hip Exercises: Stretches   Active Hamstring Stretch  Right;Left;2 reps;30 seconds          Balance Exercises - 09/01/18 1004      Balance Exercises:  Standing   Standing, One Foot on a Step  4 inch;5 reps   alternate foot taps; cues for weight shifting.   Step Ups  Forward;4 inch;UE support 2   2x leading with each LE; aerobic step in parallel bars         PT Short Term Goals - 08/26/18 1523      PT SHORT TERM GOAL #1   Title  The patient will be indep with standing HEP near countertop.    Time  4    Period  Weeks    Status  New    Target Date  09/25/18      PT SHORT TERM GOAL #2   Title  The patient will ambulate x 350 ft nonstop to demo increasing ambulation distance for short community surface negotiation mod indep with RW.    Baseline  240 ft with RW    Time  4    Period  Weeks    Status  New    Target Date  09/25/18      PT SHORT TERM GOAL #3   Title  The patient will be able to return to sitting on living room furniture and moving sit>stand to RW from his chair in home.    Time  4    Period  Weeks    Status  New    Target Date  09/25/18      PT SHORT TERM GOAL #4   Title  The patient will negotiate 4 steps with bilateral handrail and min A.    Baseline  The patient was unable to load leg to step up to bottom step, but could do a curb with CGA.    Time  4    Period  Weeks    Status  New    Target Date  09/25/18      PT SHORT TERM GOAL #5   Title  Improve gait speed from 0.61 ft/sec (on 08/26/18) up to 0.85 ft/sec to demo incrasing mobility.    Time  4    Period  Weeks    Status  New    Target Date  09/25/18        PT Long Term Goals - 08/26/18 1525      PT LONG TERM GOAL #1   Title  The patient will be indep with progression of HEP for post d/c strengthening and mobility. (LTG due date 08/24/2018)    Time  8    Period  Days    Status  Revised    Target Date  10/25/18      PT LONG TERM GOAL #2   Title  The patient will ambulate x  500 ft nonstop with RW mod indep for short community surface negotiation.    Time  8    Period  Weeks    Status  New    Target Date  10/25/18      PT LONG TERM GOAL  #3   Title  The patient will negotiate 4 steps with bilateral handrail or RW with supervision.    Time  8    Period  Weeks    Status  New    Target Date  10/25/18      PT LONG TERM GOAL #4   Title  The patient will improve gait speed from 0.61 ft/sec to > or equal to 1.0 ft/sec.    Time  8    Period  Weeks    Status  New    Target Date  09/25/18            Plan - 09/01/18 0953    Clinical Impression Statement  Skilled session focused on gait mechanic with RW and in parallel bars, strengthening and NMR for weight shifting and LLE stance with alternate toe taps and step ups.  Pt able to perform step ups in parallel bars requiring increased time to weight shift and heavy UE support. Pt required rest breaks throughout session and was fatigued with gait but had no L knee buckling epsidoes.                                                         PT Frequency  2x / week    PT Duration  8 weeks    PT Treatment/Interventions  ADLs/Self Care Home Management;Balance training;Neuromuscular re-education;Patient/family education;Gait training;Stair training;Functional mobility training;DME Instruction;Therapeutic activities;Therapeutic exercise    PT Next Visit Plan  Update HEP for standing endurance/balance. Progress gait distance ( trial forearm crutches?) , gait in parallel bars dec'ing reliance on UEs (was using canes prior to fall), standing ther ex for strengthening, gait increase in home, sci fit.    Consulted and Agree with Plan of Care  Patient       Patient will benefit from skilled therapeutic intervention in order to improve the following deficits and impairments:  Abnormal gait, Impaired sensation, Obesity, Decreased activity tolerance, Decreased balance, Decreased strength, Pain, Decreased mobility, Difficulty walking, Decreased range of motion, Impaired flexibility, Postural dysfunction  Visit Diagnosis: Muscle weakness (generalized)  Unsteadiness on feet  Other abnormalities  of gait and mobility  Physical Therapy Progress Note   Dates of Reporting Period: 05/26/2018  - 09/01/2018   Objective Reports of Subjective Statement: Patient is walking short distances with RW in the home.  He is sleeping in his bed and has returned hospital bed.   Objective Measurements: see goals above.  Goal Update: see above   Plan: see above.   Reason Skilled Services are Required: Continue to progress functional mobility to return to prior functional status.  LE strengthening, endurance training, and focus on household and limited community mobiity.  Thank you for the referral of this patient. Margretta Ditty, MPT     Problem List Patient Active Problem List   Diagnosis Date Noted  . Recurrent pulmonary embolism (HCC)   . Pain of left heel   . C4 spinal cord injury, sequela (HCC) 12/18/2017  . Tetraplegia (HCC) 12/18/2017  . Aortic aneurysm (HCC) 12/16/2017  .  Cord compression (HCC) 12/13/2017  . Chronic combined systolic and diastolic heart failure (HCC) 10/09/2016  . CAD S/P percutaneous coronary angioplasty 09/20/2016  . Erectile dysfunction 07/30/2015  . Morbid obesity with BMI of 50.0-59.9, adult (HCC) 07/18/2013  . History of pulmonary embolism 07/13/2013  . Angioedema of lips 07/13/2013  . Multinodular goiter 07/13/2013  . Dyslipidemia, goal LDL below 70 10/02/2011  . SOB (shortness of breath) 10/02/2011  . BPH with urinary obstruction 07/17/2010  . Diabetes mellitus type 2, controlled (HCC) 07/17/2010  . Bilateral lower extremity edema 12/08/2008  . NEUROPATHY, IDIOPATHIC PERIPHERAL NEC 05/31/2007  . Osteoarthritis 03/22/2007  . Gout 03/15/2007  . Essential hypertension 03/15/2007   Hortencia ConradiKarissa Terianne Thaker, PTA  09/01/18, 1:18 PM [facility] [address] [place], [place], [address] Phone: [phone]   Fax:  [phone]  Name: Franklin Hicks MRN: [id number] Date of Birth: 09/28/1950

## 2018-09-06 ENCOUNTER — Ambulatory Visit: Payer: Medicare Other | Admitting: Physical Therapy

## 2018-09-08 ENCOUNTER — Encounter: Payer: Self-pay | Admitting: Occupational Therapy

## 2018-09-08 ENCOUNTER — Ambulatory Visit: Payer: Medicare Other | Admitting: Occupational Therapy

## 2018-09-08 ENCOUNTER — Ambulatory Visit: Payer: Medicare Other | Admitting: Rehabilitative and Restorative Service Providers"

## 2018-09-08 ENCOUNTER — Encounter: Payer: Self-pay | Admitting: Rehabilitative and Restorative Service Providers"

## 2018-09-08 DIAGNOSIS — M25612 Stiffness of left shoulder, not elsewhere classified: Secondary | ICD-10-CM

## 2018-09-08 DIAGNOSIS — R208 Other disturbances of skin sensation: Secondary | ICD-10-CM

## 2018-09-08 DIAGNOSIS — R2689 Other abnormalities of gait and mobility: Secondary | ICD-10-CM

## 2018-09-08 DIAGNOSIS — M6281 Muscle weakness (generalized): Secondary | ICD-10-CM

## 2018-09-08 DIAGNOSIS — M25611 Stiffness of right shoulder, not elsewhere classified: Secondary | ICD-10-CM

## 2018-09-08 DIAGNOSIS — R2681 Unsteadiness on feet: Secondary | ICD-10-CM

## 2018-09-08 NOTE — Therapy (Signed)
[facility] [address] [place], [place], [address] Phone: [phone]   Fax:  [phone]  Physical Therapy Treatment  Patient Details  Name: Franklin Hicks MRN: [id number] Date of Birth: [birth year] Referring Provider (PT): Faith Rogue, MD   Encounter Date: 09/08/2018  PT End of Session - 09/08/18 0858    Visit Number  21    Number of Visits  34    Date for PT Re-Evaluation  10/25/18    Authorization Type  $20 copay [employer] medicare    PT Start Time  [phone]    PT Stop Time  0930    PT Time Calculation (min)  40 min    Behavior During Therapy  [facility] for tasks assessed/performed       Past Medical History:  Diagnosis Date  . Arthritis    "knees" (12/17/2017)  . CAD S/P percutaneous coronary angioplasty 09/20/2016   Nstemi 08/2016. Dr. Algie Coffer. DES- brilinta and asa. Requests change to [facility] cardiology; 95% Ramus -> PCI Resolute Onyx DES 2.75 x 18  . Central cord syndrome (HCC) 12/17/2017  . Chronic combined systolic and diastolic heart failure (HCC) 10/09/2016   EF 45% and grade II diastolic after nstemi  . Diet-controlled diabetes mellitus (HCC)   . DJD (degenerative joint disease)   . Gout   . High cholesterol   . Hypertension   . NSTEMI (non-ST elevated myocardial infarction) (HCC) 09/20/2016   95% Ramus - > PCI   . Obesity   . Recurrent pulmonary embolism (HCC) 11/'14; 4/'19   a) Bilateral segmental and subsegmental pulmonary emboli with mild RV Strain.; b) after fall with C-spine Fxr --> Acute PE of right main pulmonary artery extending into multiple segments.    Past Surgical History:  Procedure Laterality Date  . CARDIAC CATHETERIZATION N/A 09/19/2016   Procedure: Left Heart Cath and Coronary Angiography;  Surgeon: Orpah Cobb, MD;  Location: [facility] INVASIVE CV LAB;  Service: Cardiovascular: 95% proximal Ramus Intermedius --> PCI  . CARDIAC CATHETERIZATION N/A 09/19/2016   Procedure: Coronary Stent Intervention;   Surgeon: Yvonne Kendall, MD;  Location: [facility] INVASIVE CV LAB;  Service: Cardiovascular: 95% ramus intermedius;  Resolute Onyx 2.75 x 18 mm drug-eluting stent  . CYSTOSCOPY/RETROGRADE/URETEROSCOPY/STONE EXTRACTION WITH BASKET  9528,4132   ureteral stone   . MENISCUS REPAIR Left    knee open meniscetomy  . TRANSTHORACIC ECHOCARDIOGRAM  2004   no lvh nl ejection fraction  . TRANSTHORACIC ECHOCARDIOGRAM  09/19/2016   In setting of NSTEMI:  EF 45-50% with diffuse hypokinesis. GR 2 DD. Mild biatrial enlargement.    There were no vitals filed for this visit.  Subjective Assessment - 09/08/18 0849    Subjective  The patient has briefly stretched at home.  He notes he has some issues with his mattress at home.  He notes the mattress is sagging already.  The patient is walking every day at home, but is not yet sitting on the couch- continues to sit in his w/c.     Pertinent History  history of CAD with chronic combined CHF, T2DM, morbid obesity, hypertension, PE in the past; venous stasis ulcers.    Patient Stated Goals  Returning to greatest level of independence.  (prior status was short distance walking with 2 canes).    Currently in Pain?  Yes    Pain Score  3     Pain Location  Knee    Pain Descriptors / Indicators  Tightness    Pain Type  Chronic pain    Pain Onset  More than a month ago    Pain Frequency  Intermittent    Aggravating Factors   "it's doable"    Pain Relieving Factors  gentle movement                       [facility] Adult PT Treatment/Exercise - 09/08/18 0855      Ambulation/Gait   Ambulation/Gait  Yes    Ambulation/Gait Assistance  5: Supervision    Ambulation/Gait Assistance Details  Ambulated from mat to parallel bars x 12 feet with supervision.  In parallel bars with bilateral UE support x 10 feet.    Assistive device  Rolling walker    Gait Pattern  Decreased stance time - left;Decreased step length - right;Decreased step length - left;Poor foot clearance  - left;Poor foot clearance - right;Wide base of support;Trunk flexed;Shuffle    Ambulation Surface  Level;Indoor      Self-Care   Self-Care  Other Self-Care Comments    Other Self-Care Comments   discussed additions to HEP and working with family to try to get up from the couch (do when his grandson, Ivin BootyJoshua, is present.      Neuro Re-ed    Neuro Re-ed Details   Sidestepping along parallel bars x 10 feet x 3 reps with cues on upright posture.      Exercises   Exercises  Other Exercises    Other Exercises   SEATED:  hamstring stretching, ankle pumps and attempted ankle circles.  STANDING in parallel bars: gluteal sets x 10 reps, heel raises x 10 reps, and mini squats with cues to bend at knees, not trunk.             PT Education - 09/08/18 1043    Education Details  additions to HEP    Person(s) Educated  Patient    Methods  Explanation;Demonstration;Handout    Comprehension  Returned demonstration;Verbalized understanding       PT Short Term Goals - 08/26/18 1523      PT SHORT TERM GOAL #1   Title  The patient will be indep with standing HEP near countertop.    Time  4    Period  Weeks    Status  New    Target Date  09/25/18      PT SHORT TERM GOAL #2   Title  The patient will ambulate x 350 ft nonstop to demo increasing ambulation distance for short community surface negotiation mod indep with RW.    Baseline  240 ft with RW    Time  4    Period  Weeks    Status  New    Target Date  09/25/18      PT SHORT TERM GOAL #3   Title  The patient will be able to return to sitting on living room furniture and moving sit>stand to RW from his chair in home.    Time  4    Period  Weeks    Status  New    Target Date  09/25/18      PT SHORT TERM GOAL #4   Title  The patient will negotiate 4 steps with bilateral handrail and min A.    Baseline  The patient was unable to load leg to step up to bottom step, but could do a curb with CGA.    Time  4    Period  Weeks    Status  New    Target Date  09/25/18      PT SHORT TERM GOAL #5   Title  Improve gait speed from 0.61 ft/sec (on 08/26/18) up to 0.85 ft/sec to demo incrasing mobility.    Time  4    Period  Weeks    Status  New    Target Date  09/25/18        PT Long Term Goals - 08/26/18 1525      PT LONG TERM GOAL #1   Title  The patient will be indep with progression of HEP for post d/c strengthening and mobility. (LTG due date 08/24/2018)    Time  8    Period  Days    Status  Revised    Target Date  10/25/18      PT LONG TERM GOAL #2   Title  The patient will ambulate x 500 ft nonstop with RW mod indep for short community surface negotiation.    Time  8    Period  Weeks    Status  New    Target Date  10/25/18      PT LONG TERM GOAL #3   Title  The patient will negotiate 4 steps with bilateral handrail or RW with supervision.    Time  8    Period  Weeks    Status  New    Target Date  10/25/18      PT LONG TERM GOAL #4   Title  The patient will improve gait speed from 0.61 ft/sec to > or equal to 1.0 ft/sec.    Time  8    Period  Weeks    Status  New    Target Date  09/25/18              Patient will benefit from skilled therapeutic intervention in order to improve the following deficits and impairments:     Visit Diagnosis: Muscle weakness (generalized)  Unsteadiness on feet  Other abnormalities of gait and mobility     Problem List Patient Active Problem List   Diagnosis Date Noted  . Recurrent pulmonary embolism (HCC)   . Pain of left heel   . C4 spinal cord injury, sequela (HCC) 12/18/2017  . Tetraplegia (HCC) 12/18/2017  . Aortic aneurysm (HCC) 12/16/2017  . Cord compression (HCC) 12/13/2017  . Chronic combined systolic and diastolic heart failure (HCC) 10/09/2016  . CAD S/P percutaneous coronary angioplasty 09/20/2016  . Erectile dysfunction 07/30/2015  . Morbid obesity with BMI of 50.0-59.9, adult (HCC) 07/18/2013  . History of pulmonary embolism  07/13/2013  . Angioedema of lips 07/13/2013  . Multinodular goiter 07/13/2013  . Dyslipidemia, goal LDL below 70 10/02/2011  . SOB (shortness of breath) 10/02/2011  . BPH with urinary obstruction 07/17/2010  . Diabetes mellitus type 2, controlled (HCC) 07/17/2010  . Bilateral lower extremity edema 12/08/2008  . NEUROPATHY, IDIOPATHIC PERIPHERAL NEC 05/31/2007  . Osteoarthritis 03/22/2007  . Gout 03/15/2007  . Essential hypertension 03/15/2007    Trueman Worlds, PT 09/08/2018, 10:44 AM  [facility] [address] [place], [place], [address] Phone: [phone]   Fax:  [phone]  Name: Franklin Hicks MRN: [id number] Date of Birth: 1950-10-07

## 2018-09-08 NOTE — Patient Instructions (Signed)
Access Code: [email or web address]  URL: [email or web address]  Date: 09/08/2018  Prepared by: Margretta Ditty   Exercises Single Knee to Chest Stretch - 1 reps - 2 sets - 60 seconds hold - 1-2x daily - 7x weekly Supine Lower Trunk Rotation - 2 reps - 2 sets - 60 seconds hold - 1-2x daily - 7x weekly Seated Hamstring Stretch - 12 reps - 2 sets - 60 seconds hold - 1-2x daily - 7x weekly Seated Ankle Pumps - 10 reps - 2 sets - 1-2x daily - 7x weekly Standing Gluteal Sets - 10 reps - 2 sets - 1-2x daily - 7x weekly Heel rises with counter support - 10 reps - 2 sets - 1-2x daily - 7x weekly

## 2018-09-08 NOTE — Patient Instructions (Signed)
Preparing for shower: 1)  It will be helpful to complete a "dry-run" for practice of getting into and out of the shower.  We have practiced this in the clinic and it has been very successful.   Dry run = no water, clothes on.  This gives an opportunity to problem solve if there are issues we did not plan for in ur clinic practice.  If the dry run goes well - try an actual shower. Have a plan! 1)  Gather necessary supplies- plan to use multiple towels. 2)  Make sure shower bench is as far back in the tub as possible. 3)  Walk into bathroom with rolling walker.  Back up to shower seat and sit down.   4)  Place one leg at a time into the tub.   5)  Scoot over towards the inside wall of the tub  6)  Pull shower curtain, and tuck under thigh, or into gap of shower bench to prevent water from pooling on the floor 7)  Wash while seated, lean side to side to wash bottom 8)  Turn water off 9)  Dry self in tub, Dry floor of tub 10)  Slide toward edge of bench bring one foot out at a time 11)  Walk back out of bathroom

## 2018-09-08 NOTE — Therapy (Signed)
[facility] [address] [place], [place], [address] Phone: [phone]   Fax:  [phone]  Occupational Therapy Treatment  Patient Details  Name: Franklin Hicks MRN: [id number] Date of Birth: 05-14-1951 Referring Provider (OT): Jerrilyn Cairo Date: 09/08/2018  OT End of Session - 09/08/18 1016    Visit Number  15    Number of Visits  18    Date for OT Re-Evaluation  10/12/18    Authorization Type  Medicare    Authorization - Visit Number  15    Authorization - Number of Visits  20    OT Start Time  0930    OT Stop Time  1013    OT Time Calculation (min)  43 min    Activity Tolerance  Patient tolerated treatment well    Behavior During Therapy  [facility] for tasks assessed/performed       Past Medical History:  Diagnosis Date  . Arthritis    "knees" (12/17/2017)  . CAD S/P percutaneous coronary angioplasty 09/20/2016   Nstemi 08/2016. Dr. Algie Coffer. DES- brilinta and asa. Requests change to [facility] cardiology; 95% Ramus -> PCI Resolute Onyx DES 2.75 x 18  . Central cord syndrome (HCC) 12/17/2017  . Chronic combined systolic and diastolic heart failure (HCC) 10/09/2016   EF 45% and grade II diastolic after nstemi  . Diet-controlled diabetes mellitus (HCC)   . DJD (degenerative joint disease)   . Gout   . High cholesterol   . Hypertension   . NSTEMI (non-ST elevated myocardial infarction) (HCC) 09/20/2016   95% Ramus - > PCI   . Obesity   . Recurrent pulmonary embolism (HCC) 11/'14; 4/'19   a) Bilateral segmental and subsegmental pulmonary emboli with mild RV Strain.; b) after fall with C-spine Fxr --> Acute PE of right main pulmonary artery extending into multiple segments.    Past Surgical History:  Procedure Laterality Date  . CARDIAC CATHETERIZATION N/A 09/19/2016   Procedure: Left Heart Cath and Coronary Angiography;  Surgeon: Orpah Cobb, MD;  [facility]: [facility] INVASIVE CV LAB;  Service: Cardiovascular: 95% proximal  Ramus Intermedius --> PCI  . CARDIAC CATHETERIZATION N/A 09/19/2016   Procedure: Coronary Stent Intervention;  Surgeon: Yvonne Kendall, MD;  Location: [facility] INVASIVE CV LAB;  Service: Cardiovascular: 95% ramus intermedius;  Resolute Onyx 2.75 x 18 mm drug-eluting stent  . CYSTOSCOPY/RETROGRADE/URETEROSCOPY/STONE EXTRACTION WITH BASKET  5284,1324   ureteral stone   . MENISCUS REPAIR Left    knee open meniscetomy  . TRANSTHORACIC ECHOCARDIOGRAM  2004   no lvh nl ejection fraction  . TRANSTHORACIC ECHOCARDIOGRAM  09/19/2016   In setting of NSTEMI:  EF 45-50% with diffuse hypokinesis. GR 2 DD. Mild biatrial enlargement.    There were no vitals filed for this visit.  Subjective Assessment - 09/08/18 0937    Subjective   We got rid of the hospital bed- I am sleeping in the master bedroom    Patient is accompained by:  Family member    Pertinent History  C4 SCI (central cord) tetraplegia 12/13/17 from fall in tub    Currently in Pain?  Yes    Pain Score  4    3.5   Pain Location  Shoulder    Pain Orientation  Right    Pain Descriptors / Indicators  Aching                   OT Treatments/Exercises (OP) - 09/08/18 0001      ADLs  Bathing  Reviewed step by step instructions for shower technique as patient is expressing fear, and wife is fearful, of shower transfer.  See patient instructions.      Functional Mobility  Working to decreased reliance on UE  when standing.  Stood at tabletop and used both hands to fold towels, and simulated using both hands to lift to chest height shelf.               OT Education - 09/08/18 1016    Education Details  shower routine    Person(s) Educated  Patient    Methods  Explanation;Demonstration;Handout    Comprehension  Verbalized understanding       OT Short Term Goals - 07/30/18 1055      OT SHORT TERM GOAL #1   Title  Patient will complete a home exercise program designed to improve AROM in Bilateral shoulders with min assist  (due 07/07/18)    Time  4    Period  Weeks    Status  Achieved      OT SHORT TERM GOAL #2   Title  Patient will complete a stand step transfer to commode with mod assist    Time  4    Period  Weeks    Status  Achieved      OT SHORT TERM GOAL #3   Title  Patient will demonstrate static stand with min assist with upper extremities out of support for 1 minute    Time  4    Period  Weeks    Status  Achieved      OT SHORT TERM GOAL #4   Title  Patient will transition from supine to sit with mod assist in preparation for return to regular (vs hospital) bed    Time  4    Period  Weeks    Status  Achieved      OT SHORT TERM GOAL #5   Title  Patient will begin graduated driving program - first will attempt to operate car in empty lot with another adult driver.      Time  4    Period  Weeks    Status  Achieved        OT Long Term Goals - 08/13/18 1308      OT LONG TERM GOAL #1   Title  Patient will complete an upgraded HEP designed to improve BUE strength and functional range of motion (due 09/11/18)    Time  8    Period  Weeks    Status  On-going      OT LONG TERM GOAL #2   Title  Patient will be modified independnet with toilet hygiene    Time  8    Period  Weeks    Status  On-going      OT LONG TERM GOAL #3   Title  Patient will complete shower using tub transfer bench and min assist    Time  8    Period  Weeks    Status  On-going      OT LONG TERM GOAL #4   Title  Patient will demonstrate adequate dynamic stand balance tolerance (5 min) to assist with simple cooking or grilling task    Time  8    Period  Weeks    Status  Achieved      OT LONG TERM GOAL #5   Title  Patient will demonstrate ability to transition from sit to supine, supine to sit, and  roll himself in regular bed in preparation for d/c HOSPITAL BED.      Time  8    Period  Weeks    Status  Achieved      OT LONG TERM GOAL #6   Title  Patient will return to driving    Time  8    Period  Weeks     Status  Achieved            Plan - 09/08/18 1056    Clinical Impression Statement  Patient continues to show improved functional mobility - needs carryover to home of functional activities for increased independence with ADL.      Occupational Profile and client history currently impacting functional performance  Husband, father, grandfather, retired Air traffic controllerHS teacher, enjoys watching sports, water exercise class    Occupational performance deficits (Please refer to evaluation for details):  ADL's;IADL's;Rest and Sleep;Leisure    Rehab Potential  Good    Current Impairments/barriers affecting progress:  obesity    OT Frequency  2x / week    OT Duration  4 weeks    OT Treatment/Interventions  Self-care/ADL training;Aquatic Therapy;Therapeutic exercise;Functional Mobility Training;Balance training;Manual Therapy;Neuromuscular education;Therapeutic activities;Energy conservation;DME and/or AE instruction;Moist Heat;Cryotherapy;Patient/family education    Plan  CHECK ON DRY RUN SHOWER, ER shoulders, decrease UE reliance in standing    Clinical Decision Making  Several treatment options, min-mod task modification necessary    OT Home Exercise Plan  Initiated supine Bilateral shoulder exercises    Consulted and Agree with Plan of Care  Patient       Patient will benefit from skilled therapeutic intervention in order to improve the following deficits and impairments:  Decreased skin integrity, Decreased knowledge of precautions, Improper body mechanics, Decreased activity tolerance, Decreased knowledge of use of DME, Decreased strength, Impaired flexibility, Decreased balance, Decreased mobility, Difficulty walking, Impaired sensation, Obesity, Decreased range of motion, Increased edema, Pain, Decreased coordination, Impaired UE functional use  Visit Diagnosis: Muscle weakness (generalized)  Unsteadiness on feet  Stiffness of left shoulder, not elsewhere classified  Stiffness of right shoulder,  not elsewhere classified  Other disturbances of skin sensation    Problem List Patient Active Problem List   Diagnosis Date Noted  . Recurrent pulmonary embolism (HCC)   . Pain of left heel   . C4 spinal cord injury, sequela (HCC) 12/18/2017  . Tetraplegia (HCC) 12/18/2017  . Aortic aneurysm (HCC) 12/16/2017  . Cord compression (HCC) 12/13/2017  . Chronic combined systolic and diastolic heart failure (HCC) 10/09/2016  . CAD S/P percutaneous coronary angioplasty 09/20/2016  . Erectile dysfunction 07/30/2015  . Morbid obesity with BMI of 50.0-59.9, adult (HCC) 07/18/2013  . History of pulmonary embolism 07/13/2013  . Angioedema of lips 07/13/2013  . Multinodular goiter 07/13/2013  . Dyslipidemia, goal LDL below 70 10/02/2011  . SOB (shortness of breath) 10/02/2011  . BPH with urinary obstruction 07/17/2010  . Diabetes mellitus type 2, controlled (HCC) 07/17/2010  . Bilateral lower extremity edema 12/08/2008  . NEUROPATHY, IDIOPATHIC PERIPHERAL NEC 05/31/2007  . Osteoarthritis 03/22/2007  . Gout 03/15/2007  . Essential hypertension 03/15/2007    Collier SalinaGellert, Tama Grosz M, OTR/L 09/08/2018, 10:59 AM  [facility] [address] [place], [place], [address] Phone: [phone]   Fax:  [phone]  Name: Franklin Hicks MRN: [id number] Date of Birth: 03/24/1951

## 2018-09-13 ENCOUNTER — Ambulatory Visit: Payer: Medicare Other | Admitting: Rehabilitative and Restorative Service Providers"

## 2018-09-13 ENCOUNTER — Ambulatory Visit: Payer: Medicare Other | Admitting: Occupational Therapy

## 2018-09-15 ENCOUNTER — Encounter: Payer: Self-pay | Admitting: Physical Therapy

## 2018-09-15 ENCOUNTER — Ambulatory Visit: Payer: Medicare Other | Admitting: Occupational Therapy

## 2018-09-15 ENCOUNTER — Ambulatory Visit: Payer: Medicare Other | Admitting: Physical Therapy

## 2018-09-15 DIAGNOSIS — M25612 Stiffness of left shoulder, not elsewhere classified: Secondary | ICD-10-CM

## 2018-09-15 DIAGNOSIS — M6281 Muscle weakness (generalized): Secondary | ICD-10-CM | POA: Diagnosis not present

## 2018-09-15 DIAGNOSIS — R2681 Unsteadiness on feet: Secondary | ICD-10-CM

## 2018-09-15 DIAGNOSIS — R2689 Other abnormalities of gait and mobility: Secondary | ICD-10-CM

## 2018-09-15 DIAGNOSIS — M25611 Stiffness of right shoulder, not elsewhere classified: Secondary | ICD-10-CM

## 2018-09-15 NOTE — Therapy (Signed)
[facility] [address] [place], [place], [address] Phone: [phone]   Fax:  [phone]  Occupational Therapy Treatment  Patient Details  Name: Franklin Hicks MRN: [id number] Date of Birth: 10/01/1950 Referring Provider (OT): Jerrilyn CairoSwartz   Encounter Date: 09/15/2018  OT End of Session - 09/15/18 1426    Visit Number  16    Number of Visits  19    Date for OT Re-Evaluation  10/12/18    Authorization Type  Medicare    Authorization - Visit Number  16    Authorization - Number of Visits  20    OT Start Time  1230    OT Stop Time  1315    OT Time Calculation (min)  45 min    Activity Tolerance  Patient tolerated treatment well    Behavior During Therapy  [facility] for tasks assessed/performed       Past Medical History:  Diagnosis Date  . Arthritis    "knees" (12/17/2017)  . CAD S/P percutaneous coronary angioplasty 09/20/2016   Nstemi 08/2016. Dr. Algie CofferKadakia. DES- brilinta and asa. [facility]; 95% Ramus -> PCI Resolute Onyx DES 2.75 x 18  . Central cord syndrome (HCC) 12/17/2017  . Chronic combined systolic and diastolic heart failure (HCC) 10/09/2016   EF 45% and grade II diastolic after nstemi  . Diet-controlled diabetes mellitus (HCC)   . DJD (degenerative joint disease)   . Gout   . High cholesterol   . Hypertension   . NSTEMI (non-ST elevated myocardial infarction) (HCC) 09/20/2016   95% Ramus - > PCI   . Obesity   . Recurrent pulmonary embolism (HCC) 11/'14; 4/'19   a) Bilateral segmental and subsegmental pulmonary emboli with mild RV Strain.; b) after fall with C-spine Fxr --> Acute PE of right main pulmonary artery extending into multiple segments.    Past Surgical History:  Procedure Laterality Date  . CARDIAC CATHETERIZATION N/A 09/19/2016   Procedure: Left Heart Cath and Coronary Angiography;  Surgeon: Orpah CobbAjay Kadakia, MD;  Location: [facility] INVASIVE CV LAB;  Service: Cardiovascular: 95% proximal  Ramus Intermedius --> PCI  . CARDIAC CATHETERIZATION N/A 09/19/2016   Procedure: Coronary Stent Intervention;  Surgeon: Yvonne Kendallhristopher End, MD;  [facility];  Service: Cardiovascular: 95% ramus intermedius;  Resolute Onyx 2.75 x 18 mm drug-eluting stent  . CYSTOSCOPY/RETROGRADE/URETEROSCOPY/STONE EXTRACTION WITH BASKET  [id number],1999   ureteral stone   . MENISCUS REPAIR Left    knee open meniscetomy  . TRANSTHORACIC ECHOCARDIOGRAM  2004   no lvh nl ejection fraction  . TRANSTHORACIC ECHOCARDIOGRAM  09/19/2016   In setting of NSTEMI:  EF 45-50% with diffuse hypokinesis. GR 2 DD. Mild biatrial enlargement.    There were no vitals filed for this visit.  Subjective Assessment - 09/15/18 1244    Subjective   I haven't tried the "dry run" for the tub transfer yet because my wife didn't feel comfortable doing unless my grandson was there and he couldn't come this past weekend.     Pertinent History  C4 SCI (central cord) tetraplegia 12/13/17 from fall in tub    Currently in Pain?  Yes    Pain Score  4     Pain Location  Shoulder    Pain Orientation  Right    Pain Descriptors / Indicators  Aching;Sore    Pain Type  Chronic pain    Pain Onset  More than a month ago    Pain Frequency  Intermittent    Aggravating Factors   ONLY w/ movement    Pain Relieving Factors  gentle movement/stretches        Kitchen mobility in prep for functional tasks: Pt practiced retrieving items out of refrigerator from w/c level to place on counter. Pt then stood and performed side stepping w/ 1 or 2 hand countertop support to retrieve pot from lower cabinet, filling with water, and moving to stove. Pt also retrieved plastic plate from higher shelf w/ LUE (w/ compensations). Pt returned dishes, then sat in w/c to place items back in refrigerator. Discussed task modifications and kitchen adaptations needed for pt to do at home safely and independently with supervision.  Encouraged pt to perform tub transfer  ("dry run") at home this week.  Pt shown ER/IR ex w/ dowel seated and performed x 10 reps. Will issue next session                     OT Short Term Goals - 07/30/18 1055      OT SHORT TERM GOAL #1   Title  Patient will complete a home exercise program designed to improve AROM in Bilateral shoulders with min assist (due 07/07/18)    Time  4    Period  Weeks    Status  Achieved      OT SHORT TERM GOAL #2   Title  Patient will complete a stand step transfer to commode with mod assist    Time  4    Period  Weeks    Status  Achieved      OT SHORT TERM GOAL #3   Title  Patient will demonstrate static stand with min assist with upper extremities out of support for 1 minute    Time  4    Period  Weeks    Status  Achieved      OT SHORT TERM GOAL #4   Title  Patient will transition from supine to sit with mod assist in preparation for return to regular (vs hospital) bed    Time  4    Period  Weeks    Status  Achieved      OT SHORT TERM GOAL #5   Title  Patient will begin graduated driving program - first will attempt to operate car in empty lot with another adult driver.      Time  4    Period  Weeks    Status  Achieved        OT Long Term Goals - 08/13/18 1308      OT LONG TERM GOAL #1   Title  Patient will complete an upgraded HEP designed to improve BUE strength and functional range of motion (due 09/11/18)    Time  8    Period  Weeks    Status  On-going      OT LONG TERM GOAL #2   Title  Patient will be modified independnet with toilet hygiene    Time  8    Period  Weeks    Status  On-going      OT LONG TERM GOAL #3   Title  Patient will complete shower using tub transfer bench and min assist    Time  8    Period  Weeks    Status  On-going      OT LONG TERM GOAL #4   Title  Patient will demonstrate adequate dynamic stand balance tolerance (5 min) to assist with simple  cooking or grilling task    Time  8    Period  Weeks    Status  Achieved       OT LONG TERM GOAL #5   Title  Patient will demonstrate ability to transition from sit to supine, supine to sit, and roll himself in regular bed in preparation for d/c HOSPITAL BED.      Time  8    Period  Weeks    Status  Achieved      OT LONG TERM GOAL #6   Title  Patient will return to driving    Time  8    Period  Weeks    Status  Achieved            Plan - 09/15/18 1426    Clinical Impression Statement  Patient continues to show improved functional mobility - needs carryover to home of functional activities for increased independence with ADL.      Occupational Profile and client history currently impacting functional performance  Husband, father, grandfather, retired Air traffic controller, enjoys watching sports, water exercise class    Occupational performance deficits (Please refer to evaluation for details):  ADL's;IADL's;Rest and Sleep;Leisure    Rehab Potential  Good    Current Impairments/barriers affecting progress:  obesity    OT Frequency  2x / week    OT Duration  4 weeks    OT Treatment/Interventions  Self-care/ADL training;Aquatic Therapy;Therapeutic exercise;Functional Mobility Training;Balance training;Manual Therapy;Neuromuscular education;Therapeutic activities;Energy conservation;DME and/or AE instruction;Moist Heat;Cryotherapy;Patient/family education    Plan  work on BUE movement in supine and seated - issued ER exercise     Consulted and Agree with Plan of Care  Patient       Patient will benefit from skilled therapeutic intervention in order to improve the following deficits and impairments:  Decreased skin integrity, Decreased knowledge of precautions, Improper body mechanics, Decreased activity tolerance, Decreased knowledge of use of DME, Decreased strength, Impaired flexibility, Decreased balance, Decreased mobility, Difficulty walking, Impaired sensation, Obesity, Decreased range of motion, Increased edema, Pain, Decreased coordination, Impaired UE  functional use  Visit Diagnosis: Unsteadiness on feet  Stiffness of left shoulder, not elsewhere classified  Stiffness of right shoulder, not elsewhere classified    Problem List Patient Active Problem List   Diagnosis Date Noted  . Recurrent pulmonary embolism (HCC)   . Pain of left heel   . C4 spinal cord injury, sequela (HCC) 12/18/2017  . Tetraplegia (HCC) 12/18/2017  . Aortic aneurysm (HCC) 12/16/2017  . Cord compression (HCC) 12/13/2017  . Chronic combined systolic and diastolic heart failure (HCC) 10/09/2016  . CAD S/P percutaneous coronary angioplasty 09/20/2016  . Erectile dysfunction 07/30/2015  . Morbid obesity with BMI of 50.0-59.9, adult (HCC) 07/18/2013  . History of pulmonary embolism 07/13/2013  . Angioedema of lips 07/13/2013  . Multinodular goiter 07/13/2013  . Dyslipidemia, goal LDL below 70 10/02/2011  . SOB (shortness of breath) 10/02/2011  . BPH with urinary obstruction 07/17/2010  . Diabetes mellitus type 2, controlled (HCC) 07/17/2010  . Bilateral lower extremity edema 12/08/2008  . NEUROPATHY, IDIOPATHIC PERIPHERAL NEC 05/31/2007  . Osteoarthritis 03/22/2007  . Gout 03/15/2007  . Essential hypertension 03/15/2007    Kelli Churn, OTR/L 09/15/2018, 2:27 PM  [facility] [address] [place], [place], [address] Phone: [phone]   Fax:  [phone]  Name: Franklin Hicks MRN: [id number] Date of Birth: 10/22/1950

## 2018-09-15 NOTE — Therapy (Signed)
[facility] [address] [place], [place], [address] Phone: [phone]   Fax:  [phone]  Physical Therapy Treatment  Patient Details  Name: Franklin Hicks MRN: [id number] Date of Birth: May 13, 1951 Referring Provider (PT): Faith Rogue, MD   Encounter Date: 09/15/2018  PT End of Session - 09/15/18 1241    Visit Number  22    Number of Visits  34    Date for PT Re-Evaluation  10/25/18    Authorization Type  $20 copay [employer] medicare    PT Start Time  1152    PT Stop Time  1230    PT Time Calculation (min)  38 min    Behavior During Therapy  [facility] for tasks assessed/performed       Past Medical History:  Diagnosis Date  . Arthritis    "knees" (12/17/2017)  . CAD S/P percutaneous coronary angioplasty 09/20/2016   Nstemi 08/2016. Dr. Algie Coffer. DES- brilinta and asa. Requests change to [facility] cardiology; 95% Ramus -> PCI Resolute Onyx DES 2.75 x 18  . Central cord syndrome (HCC) 12/17/2017  . Chronic combined systolic and diastolic heart failure (HCC) 10/09/2016   EF 45% and grade II diastolic after nstemi  . Diet-controlled diabetes mellitus (HCC)   . DJD (degenerative joint disease)   . Gout   . High cholesterol   . Hypertension   . NSTEMI (non-ST elevated myocardial infarction) (HCC) 09/20/2016   95% Ramus - > PCI   . Obesity   . Recurrent pulmonary embolism (HCC) 11/'14; 4/'19   a) Bilateral segmental and subsegmental pulmonary emboli with mild RV Strain.; b) after fall with C-spine Fxr --> Acute PE of right main pulmonary artery extending into multiple segments.    Past Surgical History:  Procedure Laterality Date  . CARDIAC CATHETERIZATION N/A 09/19/2016   Procedure: Left Heart Cath and Coronary Angiography;  Surgeon: Orpah Cobb, MD;  [facility]: [facility] INVAS[facility] CV LAB;  Service: Cardiovascular: 95% proximal Ramus Intermedius --> PCI  . CARDIAC CATHETERIZATION N/A 09/19/2016   Procedure: Coronary Stent Intervention;   Surgeon: Yvonne Kendall, MD;  Location: [facility] INVASIVE CV LAB;  Service: Cardiovascular: 95% ramus intermedius;  Resolute Onyx 2.75 x 18 mm drug-eluting stent  . CYSTOSCOPY/RETROGRADE/URETEROSCOPY/STONE EXTRACTION WITH BASKET  1324,4010   ureteral stone   . MENISCUS REPAIR Left    knee open meniscetomy  . TRANSTHORACIC ECHOCARDIOGRAM  2004   no lvh nl ejection fraction  . TRANSTHORACIC ECHOCARDIOGRAM  09/19/2016   In setting of NSTEMI:  EF 45-50% with diffuse hypokinesis. GR 2 DD. Mild biatrial enlargement.    There were no vitals filed for this visit.  Subjective Assessment - 09/15/18 1156    Subjective  Pt worked on HEP since last visit.    Patient is accompained by:  Family member    Pertinent History  history of CAD with chronic combined CHF, T2DM, morbid obesity, hypertension, PE in the past; venous stasis ulcers.    Patient Stated Goals  Returning to greatest level of independence.  (prior status was short distance walking with 2 canes).    Currently in Pain?  Yes    Pain Score  4     Pain Location  Shoulder    Pain Orientation  Right    Pain Descriptors / Indicators  Aching    Pain Type  Chronic pain    Pain Score  2    Pain Location  Knee    Pain Orientation  Left    Pain  Descriptors / Indicators  Aching    Pain Type  Chronic pain    Pain Onset  More than a month ago    Pain Frequency  Constant                       [facility] Adult PT Treatment/Exercise - 09/15/18 0001      Ambulation/Gait   Ambulation/Gait  Yes    Ambulation/Gait Assistance  5: Supervision    Ambulation/Gait Assistance Details  working on activity tolerance and heel toe gait pattern    Ambulation Distance (Feet)  60 Feet   +70   Assistive device  Rolling walker    Gait Pattern  Decreased stance time - left;Decreased step length - right;Decreased step length - left;Poor foot clearance - left;Poor foot clearance - right;Wide base of support;Trunk flexed;Shuffle    Ambulation Surface   Level;Indoor      Lumbar Exercises: Stretches   Single Knee to Chest Stretch  Right;Left;4 reps;30 seconds    Lower Trunk Rotation  2 reps;30 seconds      Knee/Hip Exercises: Stretches   Active Hamstring Stretch  Right;Left;4 reps;30 seconds   seated            PT Education - 09/15/18 1245    Education Details  Verbally reviewed what pt is working on at home and discussed possible benefit in pt looking into getting a friend to help with accountability for greater consistency in doing HEP at home.    Person(s) Educated  Patient    Methods  Explanation    Comprehension  Verbalized understanding       PT Short Term Goals - 08/26/18 1523      PT SHORT TERM GOAL #1   Title  The patient will be indep with standing HEP near countertop.    Time  4    Period  Weeks    Status  New    Target Date  09/25/18      PT SHORT TERM GOAL #2   Title  The patient will ambulate x 350 ft nonstop to demo increasing ambulation distance for short community surface negotiation mod indep with RW.    Baseline  240 ft with RW    Time  4    Period  Weeks    Status  New    Target Date  09/25/18      PT SHORT TERM GOAL #3   Title  The patient will be able to return to sitting on living room furniture and moving sit>stand to RW from his chair in home.    Time  4    Period  Weeks    Status  New    Target Date  09/25/18      PT SHORT TERM GOAL #4   Title  The patient will negotiate 4 steps with bilateral handrail and min A.    Baseline  The patient was unable to load leg to step up to bottom step, but could do a curb with CGA.    Time  4    Period  Weeks    Status  New    Target Date  09/25/18      PT SHORT TERM GOAL #5   Title  Improve gait speed from 0.61 ft/sec (on 08/26/18) up to 0.85 ft/sec to demo incrasing mobility.    Time  4    Period  Weeks    Status  New    Target Date  09/25/18        PT Long Term Goals - 08/26/18 1525      PT LONG TERM GOAL #1   Title  The patient will  be indep with progression of HEP for post d/c strengthening and mobility. (LTG due date 08/24/2018)    Time  8    Period  Days    Status  Revised    Target Date  10/25/18      PT LONG TERM GOAL #2   Title  The patient will ambulate x 500 ft nonstop with RW mod indep for short community surface negotiation.    Time  8    Period  Weeks    Status  New    Target Date  10/25/18      PT LONG TERM GOAL #3   Title  The patient will negotiate 4 steps with bilateral handrail or RW with supervision.    Time  8    Period  Weeks    Status  New    Target Date  10/25/18      PT LONG TERM GOAL #4   Title  The patient will improve gait speed from 0.61 ft/sec to > or equal to 1.0 ft/sec.    Time  8    Period  Weeks    Status  New    Target Date  09/25/18            Plan - 09/15/18 1248    Clinical Impression Statement  Skilled session focused on gait mechanics and LE and trunk stretching.  Pt able to progress with heel toe  gait pattern demonstrating greater L foot clearance.  Pt is still very tight in low back and hamstrings and would benefit from performing stretching more consistently at home.                                                                    PT Frequency  2x / week    PT Duration  8 weeks    PT Treatment/Interventions  ADLs/Self Care Home Management;Balance training;Neuromuscular re-education;Patient/family education;Gait training;Stair training;Functional mobility training;DME Instruction;Therapeutic activities;Therapeutic exercise    PT Next Visit Plan  Update HEP for standing endurance/balance. Progress gait distance ( trial forearm crutches?) , gait in parallel bars dec'ing reliance on UEs (was using canes prior to fall), standing ther ex for strengthening, gait increase in home, sci fit.    Consulted and Agree with Plan of Care  Patient       Patient will benefit from skilled therapeutic intervention in order to improve the following deficits and impairments:   Abnormal gait, Impaired sensation, Obesity, Decreased activity tolerance, Decreased balance, Decreased strength, Pain, Decreased mobility, Difficulty walking, Decreased range of motion, Impaired flexibility, Postural dysfunction  Visit Diagnosis: Muscle weakness (generalized)  Unsteadiness on feet  Other abnormalities of gait and mobility     Problem List Patient Active Problem List   Diagnosis Date Noted  . Recurrent pulmonary embolism (HCC)   . Pain of left heel   . C4 spinal cord injury, sequela (HCC) 12/18/2017  . Tetraplegia (HCC) 12/18/2017  . Aortic aneurysm (HCC) 12/16/2017  . Cord compression (HCC) 12/13/2017  . Chronic combined systolic and diastolic heart failure (HCC) 10/09/2016  .  CAD S/P percutaneous coronary angioplasty 09/20/2016  . Erectile dysfunction 07/30/2015  . Morbid obesity with BMI of 50.0-59.9, adult (HCC) 07/18/2013  . History of pulmonary embolism 07/13/2013  . Angioedema of lips 07/13/2013  . Multinodular goiter 07/13/2013  . Dyslipidemia, goal LDL below 70 10/02/2011  . SOB (shortness of breath) 10/02/2011  . BPH with urinary obstruction 07/17/2010  . Diabetes mellitus type 2, controlled (HCC) 07/17/2010  . Bilateral lower extremity edema 12/08/2008  . NEUROPATHY, IDIOPATHIC PERIPHERAL NEC 05/31/2007  . Osteoarthritis 03/22/2007  . Gout 03/15/2007  . Essential hypertension 03/15/2007    Hortencia Conradi, PTA  09/15/18, 12:53 PM [facility] [address] [place], [place], [address] Phone: [phone]   Fax:  [phone]  Name: ADEBOWALE DRAYTON MRN: [id number] Date of Birth: 12-23-1950

## 2018-09-20 ENCOUNTER — Encounter: Payer: Self-pay | Admitting: Occupational Therapy

## 2018-09-20 ENCOUNTER — Encounter: Payer: Self-pay | Admitting: Physical Therapy

## 2018-09-20 ENCOUNTER — Ambulatory Visit: Payer: Medicare Other | Admitting: Physical Therapy

## 2018-09-20 ENCOUNTER — Ambulatory Visit: Payer: Medicare Other | Admitting: Occupational Therapy

## 2018-09-20 DIAGNOSIS — R208 Other disturbances of skin sensation: Secondary | ICD-10-CM

## 2018-09-20 DIAGNOSIS — M25611 Stiffness of right shoulder, not elsewhere classified: Secondary | ICD-10-CM

## 2018-09-20 DIAGNOSIS — R2689 Other abnormalities of gait and mobility: Secondary | ICD-10-CM

## 2018-09-20 DIAGNOSIS — M6281 Muscle weakness (generalized): Secondary | ICD-10-CM | POA: Diagnosis not present

## 2018-09-20 DIAGNOSIS — S14129S Central cord syndrome at unspecified level of cervical spinal cord, sequela: Secondary | ICD-10-CM

## 2018-09-20 DIAGNOSIS — R2681 Unsteadiness on feet: Secondary | ICD-10-CM

## 2018-09-20 NOTE — Therapy (Signed)
[facility] [address] [place], [place], [address] Phone: [phone]   Fax:  [phone]  Occupational Therapy Treatment  Patient Details  Name: Franklin Hicks MRN: [id number] Date of Birth: 02/04/1951 Referring Provider (OT): Jerrilyn CairoSwartz   Encounter Date: 09/20/2018  OT End of Session - 09/20/18 1417    Visit Number  17    Number of Visits  19    Date for OT Re-Evaluation  10/12/18    Authorization Type  Medicare    Authorization - Visit Number  17    Authorization - Number of Visits  20    OT Start Time  1317    OT Stop Time  1401    OT Time Calculation (min)  44 min    Activity Tolerance  Patient tolerated treatment well    Behavior During Therapy  [facility] assessed/performed       Past Medical History:  Diagnosis Date  . Arthritis    "knees" (12/17/2017)  . CAD S/P percutaneous coronary angioplasty 09/20/2016   Nstemi 08/2016. Dr. Algie CofferKadakia. DES- brilinta and asa. [facility]; 95% Ramus -> PCI Resolute Onyx DES 2.75 x 18  . Central cord syndrome (HCC) 12/17/2017  . Chronic combined systolic and diastolic heart failure (HCC) 10/09/2016   EF 45% and grade II diastolic after nstemi  . Diet-controlled diabetes mellitus (HCC)   . DJD (degenerative joint disease)   . Gout   . High cholesterol   . Hypertension   . NSTEMI (non-ST elevated myocardial infarction) (HCC) 09/20/2016   95% Ramus - > PCI   . Obesity   . Recurrent pulmonary embolism (HCC) 11/'14; 4/'19   a) Bilateral segmental and subsegmental pulmonary emboli with mild RV Strain.; b) after fall with C-spine Fxr --> Acute PE of right main pulmonary artery extending into multiple segments.    Past Surgical History:  Procedure Laterality Date  . CARDIAC CATHETERIZATION N/A 09/19/2016   Procedure: Left Heart Cath and Coronary Angiography;  Surgeon: Orpah CobbAjay Kadakia, MD;  Location: [facility] INVASIVE CV LAB;  Service: Cardiovascular: 95% proximal  Ramus Intermedius --> PCI  . CARDIAC CATHETERIZATION N/A 09/19/2016   Procedure: Coronary Stent Intervention;  Surgeon: Yvonne Kendallhristopher End, MD;  [facility];  Service: Cardiovascular: 95% ramus intermedius;  Resolute Onyx 2.75 x 18 mm drug-eluting stent  . CYSTOSCOPY/RETROGRADE/URETEROSCOPY/STONE EXTRACTION WITH BASKET  6962,95282007,1999   ureteral stone   . MENISCUS REPAIR Left    knee open meniscetomy  . TRANSTHORACIC ECHOCARDIOGRAM  2004   no lvh nl ejection fraction  . TRANSTHORACIC ECHOCARDIOGRAM  09/19/2016   In setting of NSTEMI:  EF 45-50% with diffuse hypokinesis. GR 2 DD. Mild biatrial enlargement.    There were no vitals filed for this visit.  Subjective Assessment - 09/20/18 1335    Subjective   I tried the dry run this weekend, and we have some issues    Pertinent History  C4 SCI (central cord) tetraplegia 12/13/17 from fall in tub    Currently in Pain?  Yes    Pain Score  4     Pain Location  Shoulder    Pain Orientation  Right    Pain Descriptors / Indicators  Aching    Pain Type  Chronic pain    Pain Onset  More than a month ago    Pain Frequency  Intermittent  OT Treatments/Exercises (OP) - 09/20/18 0001      ADLs   Bathing  Patient tried the dry run shower transfer this weekend.  His grandson was available to help.  Patient lhad difficulty getting left leg into tub due to proximity to commode, and decreased flexibility in left knee.  Patient also concerned about tucking shower curtain underneath him - for fear of water getting on floor.  Simulated bathroom set up to determine patient's ability to swing left leg into tub / out of tub.  Patient cued to scoot as far back as possible on bench, and lean back toward wall to help lift leg .  Patient also cued to use hands to guide left leg if warranted.  Reviewed shower curtain placement, and encouraged patient to get a shower liner.  Reviewed various hand held shower heads to help with  water control.      ADL Comments  Reviewed with patient the plan to discharge at end of this certification period, with the intent to have him come back in 2-3 months as needed.  Patient fearful of discharge, but comforted by ability to return to continue to advance UE function and ADL status.  He understands the benefit of working on his own to further enhance his current functioning.               OT Education - 09/20/18 1416    Education Details  shower transfer, logistics of shower    Person(s) Educated  Patient    Methods  Explanation;Demonstration    Comprehension  Verbalized understanding;Returned demonstration       OT Short Term Goals - 07/30/18 1055      OT SHORT TERM GOAL #1   Title  Patient will complete a home exercise program designed to improve AROM in Bilateral shoulders with min assist (due 07/07/18)    Time  4    Period  Weeks    Status  Achieved      OT SHORT TERM GOAL #2   Title  Patient will complete a stand step transfer to commode with mod assist    Time  4    Period  Weeks    Status  Achieved      OT SHORT TERM GOAL #3   Title  Patient will demonstrate static stand with min assist with upper extremities out of support for 1 minute    Time  4    Period  Weeks    Status  Achieved      OT SHORT TERM GOAL #4   Title  Patient will transition from supine to sit with mod assist in preparation for return to regular (vs hospital) bed    Time  4    Period  Weeks    Status  Achieved      OT SHORT TERM GOAL #5   Title  Patient will begin graduated driving program - first will attempt to operate car in empty lot with another adult driver.      Time  4    Period  Weeks    Status  Achieved        OT Long Term Goals - 09/20/18 1418      OT LONG TERM GOAL #2   Title  Patient will be modified independnet with toilet hygiene    Time  8    Period  Weeks    Status  On-going      OT LONG TERM GOAL #3   Title  Patient will complete shower using tub  transfer bench and min assist    Time  8    Period  Weeks    Status  On-going      OT LONG TERM GOAL #4   Title  Patient will demonstrate adequate dynamic stand balance tolerance (5 min) to assist with simple cooking or grilling task    Time  8    Period  Weeks    Status  Achieved      OT LONG TERM GOAL #5   Title  Patient will demonstrate ability to transition from sit to supine, supine to sit, and roll himself in regular bed in preparation for d/c HOSPITAL BED.      Time  8    Period  Weeks    Status  Achieved      OT LONG TERM GOAL #6   Title  Patient will return to driving    Time  8    Period  Weeks    Status  Achieved            Plan - 09/20/18 1417    Clinical Impression Statement  Patient is approaching OT discharge, and is making steady functional improvement with ADL.      Occupational Profile and client history currently impacting functional performance  Husband, father, grandfather, retired Air traffic controller, enjoys watching sports, water exercise class    Occupational performance deficits (Please refer to evaluation for details):  ADL's;IADL's;Rest and Sleep;Leisure    Rehab Potential  Good    Current Impairments/barriers affecting progress:  obesity    OT Frequency  2x / week    OT Duration  4 weeks    OT Treatment/Interventions  Self-care/ADL training;Aquatic Therapy;Therapeutic exercise;Functional Mobility Training;Balance training;Manual Therapy;Neuromuscular education;Therapeutic activities;Energy conservation;DME and/or AE instruction;Moist Heat;Cryotherapy;Patient/family education    Plan  work on BUE movement in supine and seated - issued ER exercise     Clinical Decision Making  Several treatment options, min-mod task modification necessary    OT Home Exercise Plan  Initiated supine Bilateral shoulder exercises    Consulted and Agree with Plan of Care  Patient       Patient will benefit from skilled therapeutic intervention in order to improve the  following deficits and impairments:  Decreased skin integrity, Decreased knowledge of precautions, Improper body mechanics, Decreased activity tolerance, Decreased knowledge of use of DME, Decreased strength, Impaired flexibility, Decreased balance, Decreased mobility, Difficulty walking, Impaired sensation, Obesity, Decreased range of motion, Increased edema, Pain, Decreased coordination, Impaired UE functional use  Visit Diagnosis: Muscle weakness (generalized)  Unsteadiness on feet  Other abnormalities of gait and mobility  Stiffness of right shoulder, not elsewhere classified  Other disturbances of skin sensation  Central cord syndrome, sequela (HCC)    Problem List Patient Active Problem List   Diagnosis Date Noted  . Recurrent pulmonary embolism (HCC)   . Pain of left heel   . C4 spinal cord injury, sequela (HCC) 12/18/2017  . Tetraplegia (HCC) 12/18/2017  . Aortic aneurysm (HCC) 12/16/2017  . Cord compression (HCC) 12/13/2017  . Chronic combined systolic and diastolic heart failure (HCC) 10/09/2016  . CAD S/P percutaneous coronary angioplasty 09/20/2016  . Erectile dysfunction 07/30/2015  . Morbid obesity with BMI of 50.0-59.9, adult (HCC) 07/18/2013  . History of pulmonary embolism 07/13/2013  . Angioedema of lips 07/13/2013  . Multinodular goiter 07/13/2013  . Dyslipidemia, goal LDL below 70 10/02/2011  . SOB (shortness of breath) 10/02/2011  . BPH with urinary obstruction  07/17/2010  . Diabetes mellitus type 2, controlled (HCC) 07/17/2010  . Bilateral lower extremity edema 12/08/2008  . NEUROPATHY, IDIOPATHIC PERIPHERAL NEC 05/31/2007  . Osteoarthritis 03/22/2007  . Gout 03/15/2007  . Essential hypertension 03/15/2007    Franklin Hicks, Franklin Hicks 09/20/2018, 2:20 PM  [facility] [address] [place], [place], [address] Phone: [phone]   Fax:  [phone]  Name: Franklin Hicks MRN:  [id number] Date of Birth: [birth year]

## 2018-09-20 NOTE — Therapy (Signed)
[facility] [address] [place], [place], [address] Phone: [phone]   Fax:  [phone]  Physical Therapy Treatment  Patient Details  Name: Franklin SpareCarl J Nitschke MRN: [id number] Date of Birth: 01/11/1951 Referring Provider (PT): Faith RogueZachary Swartz, MD   Encounter Date: 09/20/2018  PT End of Session - 09/20/18 1241    Visit Number  23    Number of Visits  34    Date for PT Re-Evaluation  10/25/18    Authorization Type  $20 copay [employer] medicare    PT Start Time  1152    PT Stop Time  1234    PT Time Calculation (min)  42 min       Past Medical History:  Diagnosis Date  . Arthritis    "knees" (12/17/2017)  . CAD S/P percutaneous coronary angioplasty 09/20/2016   Nstemi 08/2016. Dr. Algie CofferKadakia. DES- brilinta and asa. [facility]s [facility]; 95% Ramus -> PCI Resolute Onyx DES 2.75 x 18  . Central cord syndrome (HCC) 12/17/2017  . Chronic combined systolic and diastolic heart failure (HCC) 10/09/2016   EF 45% and grade II diastolic after nstemi  . Diet-controlled diabetes mellitus (HCC)   . DJD (degenerative joint disease)   . Gout   . High cholesterol   . Hypertension   . NSTEMI (non-ST elevated myocardial infarction) (HCC) 09/20/2016   95% Ramus - > PCI   . Obesity   . Recurrent pulmonary embolism (HCC) 11/'14; 4/'19   a) Bilateral segmental and subsegmental pulmonary emboli with mild RV Strain.; b) after fall with C-spine Fxr --> Acute PE of right main pulmonary artery extending into multiple segments.    Past Surgical History:  Procedure Laterality Date  . CARDIAC CATHETERIZATION N/A 09/19/2016   Procedure: Left Heart Cath and Coronary Angiography;  Surgeon: Orpah CobbAjay Kadakia, MD;  Location: [facility] INV[facility]IVE CV LAB;  Service: Cardiovascular: 95% proximal Ramus Intermedius --> PCI  . CARDIAC CATHETERIZATION N/A 09/19/2016   Procedure: Coronary Stent Intervention;  Surgeon: Yvonne Kendallhristopher End, MD;  [facility];   Service: Cardiovascular: 95% ramus intermedius;  Resolute Onyx 2.75 x 18 mm drug-eluting stent  . CYSTOSCOPY/RETROGRADE/URETEROSCOPY/STONE EXTRACTION WITH BASKET  5284,13242007,1999   ureteral stone   . MENISCUS REPAIR Left    knee open meniscetomy  . TRANSTHORACIC ECHOCARDIOGRAM  2004   no lvh nl ejection fraction  . TRANSTHORACIC ECHOCARDIOGRAM  09/19/2016   In setting of NSTEMI:  EF 45-50% with diffuse hypokinesis. GR 2 DD. Mild biatrial enlargement.    There were no vitals filed for this visit.  Subjective Assessment - 09/20/18 1152    Subjective  Pt walked into the bathroom with RW and tried a "dry run" getting into the shower. Practised sitting on the couch with grandson available to give assist.  Left knee still feels like its going to buckle when going to stand, but when standing it seems fine.  Reports walking consistenly to the bathroom to use the commode and working on using the w/c less at home. Pt is now consistently stretching in bed and notices slightly less pain upon waking .                                                            Patient is accompained by:  Family  member    Pertinent History  history of CAD with chronic combined CHF, T2DM, morbid obesity, hypertension, PE in the past; venous stasis ulcers.    Patient Stated Goals  Returning to greatest level of independence.  (prior status was short distance walking with 2 canes).    Currently in Pain?  Yes    Pain Score  4     Pain Location  Knee    Pain Orientation  Left    Pain Descriptors / Indicators  Aching    Pain Type  Chronic pain    Pain Onset  More than a month ago    Pain Frequency  Intermittent                       [facility] Adult PT Treatment/Exercise - 09/20/18 0001      Ambulation/Gait   Ambulation/Gait  Yes    Ambulation/Gait Assistance  5: Supervision    Ambulation/Gait Assistance Details  working on activity tolerance and heel toe gait pattern   Ambulation Distance (Feet)  115 Feet     Assistive device  Rolling walker    Gait Pattern  Decreased stance time - left;Decreased step length - right;Decreased step length - left;Poor foot clearance - left;Poor foot clearance - right;Wide base of support;Trunk flexed;Shuffle    Ambulation Surface  Level;Indoor      Knee/Hip Exercises: Stretches   Active Hamstring Stretch  Both;3 reps;30 seconds          Balance Exercises - 09/20/18 1225      Balance Exercises: Standing   Standing Eyes Opened  Wide (BOA)   working on lateral weight shifts, lifting alt heel, bilateral UE support needed   Other Standing Exercises  Wide BOS with upper trunk rotation, 2 to 1 UE support working on upper trunk extension.        PT Education - 09/20/18 1240    Education Details  Recommend pt performing hamstring stretches before standing (after sitting awhile) to reduce the feeling of L knee possibly buckling from sit to stand.    Person(s) Educated  Patient    Methods  Explanation;Demonstration    Comprehension  Verbalized understanding       PT Short Term Goals - 08/26/18 1523      PT SHORT TERM GOAL #1   Title  The patient will be indep with standing HEP near countertop.    Time  4    Period  Weeks    Status  New    Target Date  09/25/18      PT SHORT TERM GOAL #2   Title  The patient will ambulate x 350 ft nonstop to demo increasing ambulation distance for short community surface negotiation mod indep with RW.    Baseline  240 ft with RW    Time  4    Period  Weeks    Status  New    Target Date  09/25/18      PT SHORT TERM GOAL #3   Title  The patient will be able to return to sitting on living room furniture and moving sit>stand to RW from his chair in home.    Time  4    Period  Weeks    Status  New    Target Date  09/25/18      PT SHORT TERM GOAL #4   Title  The patient will negotiate 4 steps with bilateral handrail and min A.    Baseline  The patient was unable to load leg to step up to bottom step, but could do a  curb with CGA.    Time  4    Period  Weeks    Status  New    Target Date  09/25/18      PT SHORT TERM GOAL #5   Title  Improve gait speed from 0.61 ft/sec (on 08/26/18) up to 0.85 ft/sec to demo incrasing mobility.    Time  4    Period  Weeks    Status  New    Target Date  09/25/18        PT Long Term Goals - 08/26/18 1525      PT LONG TERM GOAL #1   Title  The patient will be indep with progression of HEP for post d/c strengthening and mobility. (LTG due date 08/24/2018)    Time  8    Period  Days    Status  Revised    Target Date  10/25/18      PT LONG TERM GOAL #2   Title  The patient will ambulate x 500 ft nonstop with RW mod indep for short community surface negotiation.    Time  8    Period  Weeks    Status  New    Target Date  10/25/18      PT LONG TERM GOAL #3   Title  The patient will negotiate 4 steps with bilateral handrail or RW with supervision.    Time  8    Period  Weeks    Status  New    Target Date  10/25/18      PT LONG TERM GOAL #4   Title  The patient will improve gait speed from 0.61 ft/sec to > or equal to 1.0 ft/sec.    Time  8    Period  Weeks    Status  New    Target Date  09/25/18            Plan - 09/20/18 1242    Clinical Impression Statement  Pt reports functional mobility progress walking more at home, performing LE stretching more consistently and trialling shower transfers and transfer from sofa.  Skilled session focus on gait mechanics and standing balance /activity tolerance.  Pt continues to require seated rest breaks between standing activities but pt demonstrates tolerating standing activities well and increased bilateral foot clearance/speed during gait.                                                           Clinical Impairments Affecting Rehab Potential  obesity, prior functional status (walked short distances with 2 canes due to h/o L knee surgery and other impairments)    PT Frequency  2x / week    PT Duration  8 weeks     PT Treatment/Interventions  ADLs/Self Care Home Management;Balance training;Neuromuscular re-education;Patient/family education;Gait training;Stair training;Functional mobility training;DME Instruction;Therapeutic activities;Therapeutic exercise    PT Next Visit Plan  check STGs, discuss schedule, standing balance and strengthening.    Consulted and Agree with Plan of Care  Patient       Patient will benefit from skilled therapeutic intervention in order to improve the following deficits and impairments:  Abnormal gait, Impaired sensation, Obesity, Decreased activity tolerance, Decreased balance, Decreased  strength, Pain, Decreased mobility, Difficulty walking, Decreased range of motion, Impaired flexibility, Postural dysfunction  Visit Diagnosis: Muscle weakness (generalized)  Other abnormalities of gait and mobility  Unsteadiness on feet     Problem List Patient Active Problem List   Diagnosis Date Noted  . Recurrent pulmonary embolism (HCC)   . Pain of left heel   . C4 spinal cord injury, sequela (HCC) 12/18/2017  . Tetraplegia (HCC) 12/18/2017  . Aortic aneurysm (HCC) 12/16/2017  . Cord compression (HCC) 12/13/2017  . Chronic combined systolic and diastolic heart failure (HCC) 10/09/2016  . CAD S/P percutaneous coronary angioplasty 09/20/2016  . Erectile dysfunction 07/30/2015  . Morbid obesity with BMI of 50.0-59.9, adult (HCC) 07/18/2013  . History of pulmonary embolism 07/13/2013  . Angioedema of lips 07/13/2013  . Multinodular goiter 07/13/2013  . Dyslipidemia, goal LDL below 70 10/02/2011  . SOB (shortness of breath) 10/02/2011  . BPH with urinary obstruction 07/17/2010  . Diabetes mellitus type 2, controlled (HCC) 07/17/2010  . Bilateral lower extremity edema 12/08/2008  . NEUROPATHY, IDIOPATHIC PERIPHERAL NEC 05/31/2007  . Osteoarthritis 03/22/2007  . Gout 03/15/2007  . Essential hypertension 03/15/2007    Hortencia Conradi, PTA  09/20/18, 12:50 PM [facility] [address] [place], [place], [address] Phone: [phone]   Fax:  [phone]  Name: LEONDRO CORYELL MRN: [id number] Date of Birth: October 05, 1950

## 2018-09-21 ENCOUNTER — Ambulatory Visit: Payer: Self-pay

## 2018-09-21 ENCOUNTER — Other Ambulatory Visit: Payer: Self-pay | Admitting: Physical Medicine & Rehabilitation

## 2018-09-21 DIAGNOSIS — S14104S Unspecified injury at C4 level of cervical spinal cord, sequela: Secondary | ICD-10-CM

## 2018-09-21 NOTE — Telephone Encounter (Signed)
Out going call to Patient  Who complain of a blister  Bursting.  Patient states that it appeared to bleed excessively finally did stop bleeding. This occurred Friday.  Blister is about the size of a dime.  Denies pain.  Encourage Patient to observe for signs of infection and call back if it occurs.  Patient voiced understanding.     Answer Assessment - Initial Assessment Questions 1. APPEARANCE of BLISTER: "What does it look like?"     Bleeding has stopped.  Patient states that he is on a blood thinner . That may be reason for prolonged bleeding.   2. SIZE: "How large is the blister?" (inches, cm or compare to coins)      Patient states that  He really cant see it.  Guesses that its the size of a dime.   3. LOCATION: "Where are the blisters located?"      Back of leg.   4. WHEN: "When did the blister happen?"    happened last friday 5. CAUSE: "What do you think caused the blister?"     *No Answer* unsure 6. PAIN: "Does it hurt?" If so, ask: "How bad is the pain?"  (Scale 1-10; or mild, moderate, severe)     denies 7. OTHER SYMPTOMS: "Do you have any other symptoms?" (e.g., fever)     Denies.  Protocols used: BLISTER - FOOT AND HAND-A-AH

## 2018-09-22 ENCOUNTER — Ambulatory Visit: Payer: Medicare Other | Admitting: Rehabilitative and Restorative Service Providers"

## 2018-09-22 ENCOUNTER — Ambulatory Visit: Payer: Medicare Other | Admitting: Occupational Therapy

## 2018-09-22 NOTE — Therapy (Signed)
[facility] [address] [place], [place], [address] Phone: [phone]   Fax:  [phone]  Patient Details  Name: Franklin Hicks MRN: [id number] Date of Birth: 11/02/1950 Referring Provider:  Shelva Majestic, MD  Encounter Date: 09/22/2018   Franklin Hicks arrived for PT treatment today reporting that he scratched his left eye with the arm of his glasses when putting them on.    He has visible L eye injury with increasing swelling and blood pooling in the lateral corner of the eye.  Mr. Lenzini does not have an eye doctor at this time.  PT looked up eye doctor site for him to call to pursue being seen.  Also discussed he could go to ED for eye injury.   Recommended he not drive, however he wanted to transfered back to his car and was going to call his daughter.  No charge for today's visit.  Amaliya Whitelaw 09/22/2018, 12:11 PM  [facility] [address] [place], [place], [address] Phone: [phone]   Fax:  [phone]

## 2018-09-27 ENCOUNTER — Ambulatory Visit: Payer: Medicare Other | Admitting: Physical Therapy

## 2018-09-29 ENCOUNTER — Ambulatory Visit: Payer: Medicare Other | Admitting: Physical Therapy

## 2018-09-29 ENCOUNTER — Ambulatory Visit: Payer: Medicare Other | Admitting: Occupational Therapy

## 2018-10-04 ENCOUNTER — Encounter: Payer: Self-pay | Admitting: Rehabilitative and Restorative Service Providers"

## 2018-10-04 ENCOUNTER — Ambulatory Visit: Payer: Medicare Other | Admitting: Occupational Therapy

## 2018-10-04 ENCOUNTER — Encounter: Payer: Self-pay | Admitting: Occupational Therapy

## 2018-10-04 ENCOUNTER — Ambulatory Visit
Payer: Medicare Other | Attending: Physical Medicine & Rehabilitation | Admitting: Rehabilitative and Restorative Service Providers"

## 2018-10-04 DIAGNOSIS — S14129S Central cord syndrome at unspecified level of cervical spinal cord, sequela: Secondary | ICD-10-CM

## 2018-10-04 DIAGNOSIS — M6281 Muscle weakness (generalized): Secondary | ICD-10-CM | POA: Insufficient documentation

## 2018-10-04 DIAGNOSIS — M25611 Stiffness of right shoulder, not elsewhere classified: Secondary | ICD-10-CM | POA: Insufficient documentation

## 2018-10-04 DIAGNOSIS — M25612 Stiffness of left shoulder, not elsewhere classified: Secondary | ICD-10-CM | POA: Diagnosis present

## 2018-10-04 DIAGNOSIS — R208 Other disturbances of skin sensation: Secondary | ICD-10-CM | POA: Insufficient documentation

## 2018-10-04 DIAGNOSIS — R2689 Other abnormalities of gait and mobility: Secondary | ICD-10-CM

## 2018-10-04 DIAGNOSIS — R2681 Unsteadiness on feet: Secondary | ICD-10-CM

## 2018-10-04 NOTE — Therapy (Signed)
[facility] [address] [place], [place], [address] Phone: [phone]   Fax:  [phone]  Occupational Therapy Treatment  Patient Details  Name: Franklin Hicks MRN: [id number] Date of Birth: [birth year] Referring Provider (OT): Jerrilyn Cairo Date: 10/04/2018  OT End of Session - 10/04/18 1245    Visit Number  18    Number of Visits  19    Date for OT Re-Evaluation  10/12/18    Authorization Type  Medicare    Authorization - Visit Number  18    Authorization - Number of Visits  20    OT Start Time  1145    OT Stop Time  1231    OT Time Calculation (min)  46 min    Activity Tolerance  Patient tolerated treatment well    Behavior During Therapy  [facility] for tasks assessed/performed       Past Medical History:  Diagnosis Date  . Arthritis    "knees" (12/17/2017)  . CAD S/P percutaneous coronary angioplasty 09/20/2016   Nstemi 08/2016. Dr. Algie Coffer. DES- brilinta and asa. Requests change to [facility] cardiology; 95% Ramus -> PCI Resolute Onyx DES 2.75 x 18  . Central cord syndrome (HCC) 12/17/2017  . Chronic combined systolic and diastolic heart failure (HCC) 10/09/2016   EF 45% and grade II diastolic after nstemi  . Diet-controlled diabetes mellitus (HCC)   . DJD (degenerative joint disease)   . Gout   . High cholesterol   . Hypertension   . NSTEMI (non-ST elevated myocardial infarction) (HCC) 09/20/2016   95% Ramus - > PCI   . Obesity   . Recurrent pulmonary embolism (HCC) 11/'14; 4/'19   a) Bilateral segmental and subsegmental pulmonary emboli with mild RV Strain.; b) after fall with C-spine Fxr --> Acute PE of right main pulmonary artery extending into multiple segments.    Past Surgical History:  Procedure Laterality Date  . CARDIAC CATHETERIZATION N/A 09/19/2016   Procedure: Left Heart Cath and Coronary Angiography;  Surgeon: Orpah Cobb, MD;  Location: [facility] INVASIVE CV LAB;  Service: Cardiovascular: 95% proximal  Ramus Intermedius --> PCI  . CARDIAC CATHETERIZATION N/A 09/19/2016   Procedure: Coronary Stent Intervention;  Surgeon: Yvonne Kendall, MD;  Location: [facility] INVASIVE CV LAB;  Service: Cardiovascular: 95% ramus intermedius;  Resolute Onyx 2.75 x 18 mm drug-eluting stent  . CYSTOSCOPY/RETROGRADE/URETEROSCOPY/STONE EXTRACTION WITH BASKET  6015,6153   ureteral stone   . MENISCUS REPAIR Left    knee open meniscetomy  . TRANSTHORACIC ECHOCARDIOGRAM  2004   no lvh nl ejection fraction  . TRANSTHORACIC ECHOCARDIOGRAM  09/19/2016   In setting of NSTEMI:  EF 45-50% with diffuse hypokinesis. GR 2 DD. Mild biatrial enlargement.    There were no vitals filed for this visit.  Subjective Assessment - 10/04/18 1240    Subjective   I thought about taking some Tylenol - but I typically grin and bear it    Patient is accompained by:  Family member    Pertinent History  C4 SCI (central cord) tetraplegia 12/13/17 from fall in tub    Currently in Pain?  Yes    Pain Score  3     Pain Location  Shoulder    Pain Orientation  Right    Pain Descriptors / Indicators  Aching    Pain Type  Chronic pain    Pain Onset  More than a month ago    Pain Frequency  Intermittent  OT Treatments/Exercises (OP) - 10/04/18 0001      ADLs   Eating  Worked today on walking to dining room, and sitting at dining room table on chair without arms.  Patient able to pull chair out from table, position it appropriately for himself, and monitor his descent .  Patient able to utilize table and momentum to stand from sit and retunr to wheelchair.  Envcouraging patient to incorporate more opportunities to walk within his home.      Toileting  Patient reports he can complete toilet hygiene without assistance.        Neurological Re-education Exercises   Other Exercises 1  Neuromuscular reeducation to address right shoulder biomechanics, specifically allowing head of humerus to drop inferiorly for mid to high  reach.  Patient lacks rotator cuff strength to secure head of humerus in socket during this movement, and has overactive pectoralis, upper trap, mid deltoid.  Patient better in gravity assisted positions, and range of motion without increased pain continues to show slow/steady improvement for flexion, abduction.  Patient's motion heavilly influenced by shoulder elevation and internal rotation - with attempts to neutralize humeral rotation, patient reports increased "twinges" of pain.                 OT Short Term Goals - 07/30/18 1055      OT SHORT TERM GOAL #1   Title  Patient will complete a home exercise program designed to improve AROM in Bilateral shoulders with min assist (due 07/07/18)    Time  4    Period  Weeks    Status  Achieved      OT SHORT TERM GOAL #2   Title  Patient will complete a stand step transfer to commode with mod assist    Time  4    Period  Weeks    Status  Achieved      OT SHORT TERM GOAL #3   Title  Patient will demonstrate static stand with min assist with upper extremities out of support for 1 minute    Time  4    Period  Weeks    Status  Achieved      OT SHORT TERM GOAL #4   Title  Patient will transition from supine to sit with mod assist in preparation for return to regular (vs hospital) bed    Time  4    Period  Weeks    Status  Achieved      OT SHORT TERM GOAL #5   Title  Patient will begin graduated driving program - first will attempt to operate car in empty lot with another adult driver.      Time  4    Period  Weeks    Status  Achieved        OT Long Term Goals - 10/04/18 1150      OT LONG TERM GOAL #2   Title  Patient will be modified independnet with toilet hygiene    Status  Achieved      OT LONG TERM GOAL #3   Title  Patient will complete shower using tub transfer bench and min assist    Status  On-going            Plan - 10/04/18 1246    Clinical Impression Statement  Patient is approaching OT discharge, and  is making steady functional improvement with ADL.      Occupational Profile and client history currently impacting functional performance  Husband,  father, grandfather, retired Air traffic controllerHS teacher, enjoys watching sports, water exercise class    Occupational performance deficits (Please refer to evaluation for details):  ADL's;IADL's;Rest and Sleep;Leisure    Rehab Potential  Good    Current Impairments/barriers affecting progress:  obesity    OT Frequency  2x / week    OT Duration  4 weeks    OT Treatment/Interventions  Self-care/ADL training;Aquatic Therapy;Therapeutic exercise;Functional Mobility Training;Balance training;Manual Therapy;Neuromuscular education;Therapeutic activities;Energy conservation;DME and/or AE instruction;Moist Heat;Cryotherapy;Patient/family education    Plan  review HEP, and Encourage independence with ADL    Clinical Decision Making  Several treatment options, min-mod task modification necessary    OT Home Exercise Plan  Initiated supine Bilateral shoulder exercises    Consulted and Agree with Plan of Care  Patient       Patient will benefit from skilled therapeutic intervention in order to improve the following deficits and impairments:  Decreased skin integrity, Decreased knowledge of precautions, Improper body mechanics, Decreased activity tolerance, Decreased knowledge of use of DME, Decreased strength, Impaired flexibility, Decreased balance, Decreased mobility, Difficulty walking, Impaired sensation, Obesity, Decreased range of motion, Increased edema, Pain, Decreased coordination, Impaired UE functional use  Visit Diagnosis: Muscle weakness (generalized)  Unsteadiness on feet  Other disturbances of skin sensation  Stiffness of right shoulder, not elsewhere classified  Stiffness of left shoulder, not elsewhere classified  Central cord syndrome, sequela (HCC)    Problem List Patient Active Problem List   Diagnosis Date Noted  . Recurrent pulmonary embolism  (HCC)   . Pain of left heel   . C4 spinal cord injury, sequela (HCC) 12/18/2017  . Tetraplegia (HCC) 12/18/2017  . Aortic aneurysm (HCC) 12/16/2017  . Cord compression (HCC) 12/13/2017  . Chronic combined systolic and diastolic heart failure (HCC) 10/09/2016  . CAD S/P percutaneous coronary angioplasty 09/20/2016  . Erectile dysfunction 07/30/2015  . Morbid obesity with BMI of 50.0-59.9, adult (HCC) 07/18/2013  . History of pulmonary embolism 07/13/2013  . Angioedema of lips 07/13/2013  . Multinodular goiter 07/13/2013  . Dyslipidemia, goal LDL below 70 10/02/2011  . SOB (shortness of breath) 10/02/2011  . BPH with urinary obstruction 07/17/2010  . Diabetes mellitus type 2, controlled (HCC) 07/17/2010  . Bilateral lower extremity edema 12/08/2008  . NEUROPATHY, IDIOPATHIC PERIPHERAL NEC 05/31/2007  . Osteoarthritis 03/22/2007  . Gout 03/15/2007  . Essential hypertension 03/15/2007    Collier SalinaGellert, Jaleel Allen M 10/04/2018, 12:47 PM  [facility] [address] [place], [place], [address] Phone: [phone]   Fax:  [phone]  Name: Franklin Hicks MRN: [id number] Date of Birth: 08/22/1951

## 2018-10-04 NOTE — Therapy (Signed)
[facility] [address] [place], [place], [address] Phone: [phone]   Fax:  [phone]  Physical Therapy Treatment  Patient Details  Name: Franklin Hicks MRN: [id number] Date of Birth: [birth year] Referring Provider (PT): Alger Simons, MD   Encounter Date: 10/04/2018  PT End of Session - 10/04/18 1219    Visit Number  24    Number of Visits  34    Date for PT Re-Evaluation  10/25/18    Authorization Type  $20 copay [employer] medicare    PT Start Time  1108    PT Stop Time  1147    PT Time Calculation (min)  39 min       Past Medical History:  Diagnosis Date  . Arthritis    "knees" (12/17/2017)  . CAD S/P percutaneous coronary angioplasty 09/20/2016   Nstemi 08/2016. Dr. Doylene Canard. DES- brilinta and asa. Requests change to [facility] cardiology; 95% Ramus -> PCI Resolute Onyx DES 2.75 x 18  . Central cord syndrome ([place]) 12/17/2017  . Chronic combined systolic and diastolic heart failure ([place]) 10/09/2016   EF 45% and grade II diastolic after nstemi  . Diet-controlled diabetes mellitus ([place])   . DJD (degenerative joint disease)   . Gout   . High cholesterol   . Hypertension   . NSTEMI (non-ST elevated myocardial infarction) ([place]) 09/20/2016   95% Ramus - > PCI   . Obesity   . Recurrent pulmonary embolism ([place]) 11/'14; 4/'19   a) Bilateral segmental and subsegmental pulmonary emboli with mild RV Strain.; b) after fall with C-spine Fxr --> Acute PE of right main pulmonary artery extending into multiple segments.    Past Surgical History:  Procedure Laterality Date  . CARDIAC CATHETERIZATION N/A 09/19/2016   Procedure: Left Heart Cath and Coronary Angiography;  Surgeon: Dixie Dials, MD;  Location: [facility];  Service: Cardiovascular: 95% proximal Ramus Intermedius --> PCI  . CARDIAC CATHETERIZATION N/A 09/19/2016   Procedure: Coronary Stent Intervention;  Surgeon: Nelva Bush, MD;  Location: [facility];   Service: Cardiovascular: 95% ramus intermedius;  Resolute Onyx 2.75 x 18 mm drug-eluting stent  . CYSTOSCOPY/RETROGRADE/URETEROSCOPY/STONE EXTRACTION WITH BASKET  8563,1497   ureteral stone   . MENISCUS REPAIR Left    knee open meniscetomy  . TRANSTHORACIC ECHOCARDIOGRAM  2004   no lvh nl ejection fraction  . TRANSTHORACIC ECHOCARDIOGRAM  09/19/2016   In setting of NSTEMI:  EF 45-50% with diffuse hypokinesis. GR 2 DD. Mild biatrial enlargement.    There were no vitals filed for this visit.  Subjective Assessment - 10/04/18 1108    Subjective  "I'm using my walker a little bit more."  He has tried sitting on the couch, but does need his Grandson's assist to get up.  He is using the walker for short distances and then using his chair for longer distances in the house.  The left leg feels like it wants to buckle.  "Days like today, I can do it, but the leg may buckle."    He continues with a heavy feeling in his legs when standing.    Pertinent History  history of CAD with chronic combined CHF, T2DM, morbid obesity, hypertension, PE in the past; venous stasis ulcers.    Patient Stated Goals  Returning to greatest level of independence.  (prior status was short distance walking with 2 canes).    Currently in Pain?  Yes    Pain Score  3  Pain Location  Shoulder    Pain Orientation  Right    Pain Descriptors / Indicators  Aching    Pain Onset  More than a month ago    Pain Frequency  Intermittent    Aggravating Factors   only with movement    Pain Relieving Factors  stretching helps    Effect of Pain on Daily Activities  *notes "knee area" but clarifies that it feels weak, but not painful.                       [facility] Adult PT Treatment/Exercise - 10/04/18 0001      Ambulation/Gait   Ambulation/Gait  Yes    Ambulation/Gait Assistance  5: Supervision;4: Min guard    Ambulation/Gait Assistance Details  The patient notes fatigue as main limiting factor.    Ambulation  Distance (Feet)  250 Feet    Assistive device  Rolling walker    Gait Pattern  Decreased stance time - left;Decreased step length - right;Decreased step length - left;Poor foot clearance - left;Poor foot clearance - right;Wide base of support;Trunk flexed;Shuffle    Ambulation Surface  Level;Indoor    Gait velocity  0.58 ft/sec (improved from eval of 0.37 ft/sec, but dec'd from last measurement of 0.61 ft/sec      Self-Care   Self-Care  Other Self-Care Comments    Other Self-Care Comments   PT discussed that patient's gait speed and distance have remained the same.  He had less activity for the last 2 weeks due to an eye injury,  PT discussed that our focus of PT is increasing his overall daily mobility.  We discussed ways to accomplish this task and the barriers.  Patient notes he does not walk into the kitchen due to laminate floor and wearing bedroom shoes.  PT recommends he use tennis shoes daily to put on after breakfast and "get dressed for the day"  to take away barriers to walking more.  We discussed eating meals at the kitchen table and being able to walk into the kitchen in order to perform standing exercises.  Patient verbalizes understanding.             PT Education - 10/04/18 1217    Education Details  PT and patient discussed slowing progress.  PT to emphasize home program adding further standing activities and increasing difficulty of ther ex as safely able.  Also discussed patient's mobility in the home focusing on walking to the kitchen, wearing tennis shoes, and moving more.    Person(s) Educated  Patient    Methods  Explanation    Comprehension  Verbalized understanding       PT Short Term Goals - 10/04/18 1111      PT SHORT TERM GOAL #1   Title  The patient will be indep with standing HEP near countertop.    Baseline  Patient has a couple of standing exercises prescribed for home + other stretching and seated exercises.  He is not performing ther ex regularly.     Time  4    Period  Weeks    Status  Partially Met      PT SHORT TERM GOAL #2   Title  The patient will ambulate x 350 ft nonstop to demo increasing ambulation distance for short community surface negotiation mod indep with RW.    Baseline  250 ft with RW today.    Time  4    Period  Weeks  Status  Not Met      PT SHORT TERM GOAL #3   Title  The patient will be able to return to sitting on living room furniture and moving sit>stand to RW from his chair in home.    Baseline  Patient continuing to use w/c due to needing his grandson's assist to get up from sofa.    Time  4    Period  Weeks    Status  Not Met      PT SHORT TERM GOAL #4   Title  The patient will negotiate 4 steps with bilateral handrail and min A.    Baseline  Have not progressed to stairs -- patient had eye injury limiting therapy x 2 weeks.    Time  4    Period  Weeks    Status  Not Met      PT SHORT TERM GOAL #5   Title  Improve gait speed from 0.61 ft/sec (on 08/26/18) up to 0.85 ft/sec to demo incrasing mobility.    Baseline  0.58 ft/sec (has improved from eval, but is plateauing).    Time  4    Period  Weeks    Status  Not Met        PT Long Term Goals - 08/26/18 1525      PT LONG TERM GOAL #1   Title  The patient will be indep with progression of HEP for post d/c strengthening and mobility. (LTG due date 08/24/2018)    Time  8    Period  Days    Status  Revised    Target Date  10/25/18      PT LONG TERM GOAL #2   Title  The patient will ambulate x 500 ft nonstop with RW mod indep for short community surface negotiation.    Time  8    Period  Weeks    Status  New    Target Date  10/25/18      PT LONG TERM GOAL #3   Title  The patient will negotiate 4 steps with bilateral handrail or RW with supervision.    Time  8    Period  Weeks    Status  New    Target Date  10/25/18      PT LONG TERM GOAL #4   Title  The patient will improve gait speed from 0.61 ft/sec to > or equal to 1.0 ft/sec.     Time  8    Period  Weeks    Status  New    Target Date  09/25/18            Plan - 10/04/18 1226    Clinical Impression Statement  PT and patient discussed limited progress since last renewal.  He feels not coming to therapy x past 2 weeks due to eye injury has hindered his progress.  PT discussed that he has to participate more with standing exercise and increase walking in the home to progress his mobility.  PT recommends adding further visits to allow specific goals of:  1) patient to wear tennis shoes in the home for more access to the kitchen 2) walking into the kitchen for atleast 1 meal/day 3) performing more standing exercises (PT to modify to try mini squats, weight shifting in standing, standing balance, and more heel raises).    PT Treatment/Interventions  ADLs/Self Care Home Management;Balance training;Neuromuscular re-education;Patient/family education;Gait training;Stair training;Functional mobility training;DME Instruction;Therapeutic activities;Therapeutic exercise    PT Next  Visit Plan  PT had patient add more to the schedule.  *See clinical impression statement above.  Add further standing HEP, focus on building more mobility into daily tasks.    Consulted and Agree with Plan of Care  Patient       Patient will benefit from skilled therapeutic intervention in order to improve the following deficits and impairments:  Abnormal gait, Impaired sensation, Obesity, Decreased activity tolerance, Decreased balance, Decreased strength, Pain, Decreased mobility, Difficulty walking, Decreased range of motion, Impaired flexibility, Postural dysfunction  Visit Diagnosis: Muscle weakness (generalized)  Other abnormalities of gait and mobility  Unsteadiness on feet     Problem List Patient Active Problem List   Diagnosis Date Noted  . Recurrent pulmonary embolism ([place])   . Pain of left heel   . C4 spinal cord injury, sequela ([place]) 12/18/2017  . Tetraplegia ([place]) 12/18/2017   . Aortic aneurysm ([place]) 12/16/2017  . Cord compression ([place]) 12/13/2017  . Chronic combined systolic and diastolic heart failure ([place]) 10/09/2016  . CAD S/P percutaneous coronary angioplasty 09/20/2016  . Erectile dysfunction 07/30/2015  . Morbid obesity with BMI of 50.0-59.9, adult ([place]) 07/18/2013  . History of pulmonary embolism 07/13/2013  . Angioedema of lips 07/13/2013  . Multinodular goiter 07/13/2013  . Dyslipidemia, goal LDL below 70 10/02/2011  . SOB (shortness of breath) 10/02/2011  . BPH with urinary obstruction 07/17/2010  . Diabetes mellitus type 2, controlled ([place]) 07/17/2010  . Bilateral lower extremity edema 12/08/2008  . NEUROPATHY, IDIOPATHIC PERIPHERAL NEC 05/31/2007  . Osteoarthritis 03/22/2007  . Gout 03/15/2007  . Essential hypertension 03/15/2007    WEAVER,CHRISTINA, PT 10/04/2018, 12:31 PM  [facility] [address] [place], [place], [address] Phone: [phone]   Fax:  [phone]  Name: Franklin Hicks MRN: [id number] Date of Birth: March 31, 1951

## 2018-10-06 ENCOUNTER — Encounter: Payer: Self-pay | Admitting: Physical Therapy

## 2018-10-06 ENCOUNTER — Ambulatory Visit: Payer: Medicare Other | Admitting: Physical Therapy

## 2018-10-06 DIAGNOSIS — M6281 Muscle weakness (generalized): Secondary | ICD-10-CM | POA: Diagnosis not present

## 2018-10-06 DIAGNOSIS — R2689 Other abnormalities of gait and mobility: Secondary | ICD-10-CM

## 2018-10-06 DIAGNOSIS — R2681 Unsteadiness on feet: Secondary | ICD-10-CM

## 2018-10-06 NOTE — Therapy (Signed)
[facility] [address] [place], [place], [address] Phone: [phone]   Fax:  [phone]  Physical Therapy Treatment  Patient Details  Name: Franklin Hicks MRN: [id number] Date of Birth: 12-13-1950 Referring Provider (PT): Alger Simons, MD   Encounter Date: 10/06/2018  PT End of Session - 10/06/18 1015    Visit Number  25    Number of Visits  34    Date for PT Re-Evaluation  10/25/18    Authorization Type  $20 copay [employer] medicare    PT Start Time  [phone]    PT Stop Time  1015    PT Time Calculation (min)  44 min    Behavior During Therapy  [facility] for tasks assessed/performed       Past Medical History:  Diagnosis Date  . Arthritis    "knees" (12/17/2017)  . CAD S/P percutaneous coronary angioplasty 09/20/2016   Nstemi 08/2016. Dr. Doylene Canard. DES- brilinta and asa. Requests change to [facility] cardiology; 95% Ramus -> PCI Resolute Onyx DES 2.75 x 18  . Central cord syndrome ([place]) 12/17/2017  . Chronic combined systolic and diastolic heart failure ([place]) 10/09/2016   EF 45% and grade II diastolic after nstemi  . Diet-controlled diabetes mellitus ([place])   . DJD (degenerative joint disease)   . Gout   . High cholesterol   . Hypertension   . NSTEMI (non-ST elevated myocardial infarction) ([place]) 09/20/2016   95% Ramus - > PCI   . Obesity   . Recurrent pulmonary embolism ([place]) 11/'14; 4/'19   a) Bilateral segmental and subsegmental pulmonary emboli with mild RV Strain.; b) after fall with C-spine Fxr --> Acute PE of right main pulmonary artery extending into multiple segments.    Past Surgical History:  Procedure Laterality Date  . CARDIAC CATHETERIZATION N/A 09/19/2016   Procedure: Left Heart Cath and Coronary Angiography;  Surgeon: Dixie Dials, MD;  Location: [facility];  Service: Cardiovascular: 95% proximal Ramus Intermedius --> PCI  . CARDIAC CATHETERIZATION N/A 09/19/2016   Procedure: Coronary Stent Intervention;   Surgeon: Nelva Bush, MD;  Location: [facility];  Service: Cardiovascular: 95% ramus intermedius;  Resolute Onyx 2.75 x 18 mm drug-eluting stent  . CYSTOSCOPY/RETROGRADE/URETEROSCOPY/STONE EXTRACTION WITH BASKET  1517,6160   ureteral stone   . MENISCUS REPAIR Left    knee open meniscetomy  . TRANSTHORACIC ECHOCARDIOGRAM  2004   no lvh nl ejection fraction  . TRANSTHORACIC ECHOCARDIOGRAM  09/19/2016   In setting of NSTEMI:  EF 45-50% with diffuse hypokinesis. GR 2 DD. Mild biatrial enlargement.    There were no vitals filed for this visit.  Subjective Assessment - 10/06/18 0934    Subjective  Pt ate in the dining room since last visit,  Plans to start eating lunch and dinner in dining room.    Pertinent History  history of CAD with chronic combined CHF, T2DM, morbid obesity, hypertension, PE in the past; venous stasis ulcers.    Patient Stated Goals  Returning to greatest level of independence.  (prior status was short distance walking with 2 canes).    Currently in Pain?  Yes    Pain Score  3     Pain Location  Knee    Pain Orientation  Right;Left    Pain Descriptors / Indicators  Aching    Pain Onset  More than a month ago    Pain Frequency  Constant           Pt performed  exercises below with cues for posture and technique. Perform 5 min x2 with seated rest break.   Standing Gluteal Sets - 10 reps - 2 sets - 1-2x daily - 7x weekly  Heel rises with counter support - 10 reps - 2 sets - 1-2x daily - 7x weekly  Added to HEP 10/06/18 and Recommend pt stand 5 min 2x/day  Upper trunk rotation - 5 reps - 2 sets - 3 seconds hold - 1-2x daily - 5x weekly             [facility] Adult PT Treatment/Exercise - 10/06/18 0001      Ambulation/Gait   Ambulation/Gait  Yes    Ambulation/Gait Assistance  5: Supervision    Ambulation/Gait Assistance Details  working on increasing speed, foot clearance, and activity tolerance. W/c behind pt during 2nd lap.                                  Ambulation Distance (Feet)  230 Feet    Assistive device  Rolling walker    Gait Pattern  Decreased stance time - left;Decreased step length - right;Decreased step length - left;Poor foot clearance - left;Poor foot clearance - right;Wide base of support;Trunk flexed;Shuffle    Ambulation Surface  Level;Indoor      Knee/Hip Exercises: Stretches   Active Hamstring Stretch  Both;2 reps;30 seconds             PT Education - 10/06/18 0949    Education Details  Importance oF increasing standing endurance and one more standing exercise and recommend pt standing 5 min 2x/day.     Person(s) Educated  Patient    Methods  Explanation;Demonstration;Verbal cues;Handout    Comprehension  Returned demonstration;Verbal cues required;Need further instruction;Verbalized understanding       PT Short Term Goals - 10/04/18 1111      PT SHORT TERM GOAL #1   Title  The patient will be indep with standing HEP near countertop.    Baseline  Patient has a couple of standing exercises prescribed for home + other stretching and seated exercises.  He is not performing ther ex regularly.    Time  4    Period  Weeks    Status  Partially Met      PT SHORT TERM GOAL #2   Title  The patient will ambulate x 350 ft nonstop to demo increasing ambulation distance for short community surface negotiation mod indep with RW.    Baseline  250 ft with RW today.    Time  4    Period  Weeks    Status  Not Met      PT SHORT TERM GOAL #3   Title  The patient will be able to return to sitting on living room furniture and moving sit>stand to RW from his chair in home.    Baseline  Patient continuing to use w/c due to needing his grandson's assist to get up from sofa.    Time  4    Period  Weeks    Status  Not Met      PT SHORT TERM GOAL #4   Title  The patient will negotiate 4 steps with bilateral handrail and min A.    Baseline  Have not progressed to stairs -- patient had eye injury limiting therapy x 2  weeks.    Time  4    Period  Weeks  Status  Not Met      PT SHORT TERM GOAL #5   Title  Improve gait speed from 0.61 ft/sec (on 08/26/18) up to 0.85 ft/sec to demo incrasing mobility.    Baseline  0.58 ft/sec (has improved from eval, but is plateauing).    Time  4    Period  Weeks    Status  Not Met        PT Long Term Goals - 08/26/18 1525      PT LONG TERM GOAL #1   Title  The patient will be indep with progression of HEP for post d/c strengthening and mobility. (LTG due date 08/24/2018)    Time  8    Period  Days    Status  Revised    Target Date  10/25/18      PT LONG TERM GOAL #2   Title  The patient will ambulate x 500 ft nonstop with RW mod indep for short community surface negotiation.    Time  8    Period  Weeks    Status  New    Target Date  10/25/18      PT LONG TERM GOAL #3   Title  The patient will negotiate 4 steps with bilateral handrail or RW with supervision.    Time  8    Period  Weeks    Status  New    Target Date  10/25/18      PT LONG TERM GOAL #4   Title  The patient will improve gait speed from 0.61 ft/sec to > or equal to 1.0 ft/sec.    Time  8    Period  Weeks    Status  New    Target Date  09/25/18            Plan - 10/06/18 1309    Clinical Impression Statement  Skilled session focused on increasing activitiy tolerance with dynamic standing and gait.  Pt was able to perform standing exercises 5 min x2; pt requiring heavy UE support. Updated HEP with one more standing exercise and encouraged pt to work on standing 5 min 2x/day to progress activitiy tolerance.  Gait training working on bilateral foot clearance, speed, and endurance; pt continues to fatigue and need seated rest around 230 ft.                                                                          PT Treatment/Interventions  ADLs/Self Care Home Management;Balance training;Neuromuscular re-education;Patient/family education;Gait training;Stair training;Functional mobility  training;DME Instruction;Therapeutic activities;Therapeutic exercise    PT Next Visit Plan  PT had patient add more to the schedule. Check updated standing HEP, focus on building more mobility into daily tasks.    Consulted and Agree with Plan of Care  Patient       Patient will benefit from skilled therapeutic intervention in order to improve the following deficits and impairments:  Abnormal gait, Impaired sensation, Obesity, Decreased activity tolerance, Decreased balance, Decreased strength, Pain, Decreased mobility, Difficulty walking, Decreased range of motion, Impaired flexibility, Postural dysfunction  Visit Diagnosis: Muscle weakness (generalized)  Unsteadiness on feet  Other abnormalities of gait and mobility     Problem List Patient Active Problem  List   Diagnosis Date Noted  . Recurrent pulmonary embolism ([place])   . Pain of left heel   . C4 spinal cord injury, sequela ([place]) 12/18/2017  . Tetraplegia ([place]) 12/18/2017  . Aortic aneurysm ([place]) 12/16/2017  . Cord compression ([place]) 12/13/2017  . Chronic combined systolic and diastolic heart failure ([place]) 10/09/2016  . CAD S/P percutaneous coronary angioplasty 09/20/2016  . Erectile dysfunction 07/30/2015  . Morbid obesity with BMI of 50.0-59.9, adult ([place]) 07/18/2013  . History of pulmonary embolism 07/13/2013  . Angioedema of lips 07/13/2013  . Multinodular goiter 07/13/2013  . Dyslipidemia, goal LDL below 70 10/02/2011  . SOB (shortness of breath) 10/02/2011  . BPH with urinary obstruction 07/17/2010  . Diabetes mellitus type 2, controlled ([place]) 07/17/2010  . Bilateral lower extremity edema 12/08/2008  . NEUROPATHY, IDIOPATHIC PERIPHERAL NEC 05/31/2007  . Osteoarthritis 03/22/2007  . Gout 03/15/2007  . Essential hypertension 03/15/2007    Bjorn Loser, PTA  10/06/18, 1:22 PM [facility] [address] [place], [place], [address] Phone: [phone]    Fax:  [phone]  Name: Franklin Hicks MRN: [id number] Date of Birth: Oct 31, 1950

## 2018-10-06 NOTE — Patient Instructions (Addendum)
Access Code: [email or web address]  URL: [email or web address]  Date: 10/06/2018  Prepared by: Hortencia Conradi   Exercises  Single Knee to Chest Stretch - 1 reps - 2 sets - 60 seconds hold - 1-2x daily - 7x weekly  Supine Lower Trunk Rotation - 2 reps - 2 sets - 60 seconds hold - 1-2x daily - 7x weekly  Seated Hamstring Stretch - 12 reps - 2 sets - 60 seconds hold - 1-2x daily - 7x weekly  Seated Ankle Pumps - 10 reps - 2 sets - 1-2x daily - 7x weekly  Standing Gluteal Sets - 10 reps - 2 sets - 1-2x daily - 7x weekly  Heel rises with counter support - 10 reps - 2 sets - 1-2x daily - 7x weekly  Added to HEP 10/06/18 and Recommend pt stand 5 min 2x/day  Upper trunk rotation - 5 reps - 2 sets - 3 seconds hold - 1-2x daily - 5x weekly

## 2018-10-07 ENCOUNTER — Encounter: Payer: Medicare Other | Admitting: Occupational Therapy

## 2018-10-08 ENCOUNTER — Encounter: Payer: Self-pay | Admitting: Occupational Therapy

## 2018-10-08 ENCOUNTER — Ambulatory Visit: Payer: Medicare Other | Admitting: Occupational Therapy

## 2018-10-08 DIAGNOSIS — R2681 Unsteadiness on feet: Secondary | ICD-10-CM

## 2018-10-08 DIAGNOSIS — R208 Other disturbances of skin sensation: Secondary | ICD-10-CM

## 2018-10-08 DIAGNOSIS — M25611 Stiffness of right shoulder, not elsewhere classified: Secondary | ICD-10-CM

## 2018-10-08 DIAGNOSIS — M6281 Muscle weakness (generalized): Secondary | ICD-10-CM | POA: Diagnosis not present

## 2018-10-08 DIAGNOSIS — M25612 Stiffness of left shoulder, not elsewhere classified: Secondary | ICD-10-CM

## 2018-10-08 NOTE — Therapy (Signed)
[facility] [address] [place], [place], [address] Phone: [phone]   Fax:  [phone]  Occupational Therapy Treatment  Patient Details  Name: Franklin Hicks MRN: [id number] Date of Birth: 1951/07/22 Referring Provider (OT): Vickey Sages Date: 10/08/2018  OT End of Session - 10/08/18 1544    Visit Number  19    Number of Visits  19    Date for OT Re-Evaluation  10/12/18    Authorization Type  Medicare    Authorization - Visit Number  51    Authorization - Number of Visits  20    OT Start Time  1497    OT Stop Time  1530    OT Time Calculation (min)  43 min    Activity Tolerance  Patient tolerated treatment well    Behavior During Therapy  [facility] for tasks assessed/performed       Past Medical History:  Diagnosis Date  . Arthritis    "knees" (12/17/2017)  . CAD S/P percutaneous coronary angioplasty 09/20/2016   Nstemi 08/2016. Dr. Doylene Canard. DES- brilinta and asa. Requests change to [facility] cardiology; 95% Ramus -> PCI Resolute Onyx DES 2.75 x 18  . Central cord syndrome ([place]) 12/17/2017  . Chronic combined systolic and diastolic heart failure ([place]) 10/09/2016   EF 45% and grade II diastolic after nstemi  . Diet-controlled diabetes mellitus ([place])   . DJD (degenerative joint disease)   . Gout   . High cholesterol   . Hypertension   . NSTEMI (non-ST elevated myocardial infarction) ([place]) 09/20/2016   95% Ramus - > PCI   . Obesity   . Recurrent pulmonary embolism ([place]) 11/'14; 4/'19   a) Bilateral segmental and subsegmental pulmonary emboli with mild RV Strain.; b) after fall with C-spine Fxr --> Acute PE of right main pulmonary artery extending into multiple segments.    Past Surgical History:  Procedure Laterality Date  . CARDIAC CATHETERIZATION N/A 09/19/2016   Procedure: Left Heart Cath and Coronary Angiography;  Surgeon: Dixie Dials, MD;  Location: [facility];  Service: Cardiovascular: 95% proximal  Ramus Intermedius --> PCI  . CARDIAC CATHETERIZATION N/A 09/19/2016   Procedure: Coronary Stent Intervention;  Surgeon: Nelva Bush, MD;  Location: [facility];  Service: Cardiovascular: 95% ramus intermedius;  Resolute Onyx 2.75 x 18 mm drug-eluting stent  . CYSTOSCOPY/RETROGRADE/URETEROSCOPY/STONE EXTRACTION WITH BASKET  0263,7858   ureteral stone   . MENISCUS REPAIR Left    knee open meniscetomy  . TRANSTHORACIC ECHOCARDIOGRAM  2004   no lvh nl ejection fraction  . TRANSTHORACIC ECHOCARDIOGRAM  09/19/2016   In setting of NSTEMI:  EF 45-50% with diffuse hypokinesis. GR 2 DD. Mild biatrial enlargement.    There were no vitals filed for this visit.  Subjective Assessment - 10/08/18 1456    Subjective   I am walking into the dining room and sitting at the table now    Patient is accompained by:  Family member    Pertinent History  C4 SCI (central cord) tetraplegia 12/13/17 from fall in tub    Currently in Pain?  No/denies    Pain Score  0-No pain                   OT Treatments/Exercises (OP) - 10/08/18 0001      ADLs   ADL Comments  Reviewed plan to discharge and consider reeval in 8 - 12 weeks.  Patient anxious about discharge and we agreed on  8 week break from therapy to allow him to continue to work toward independence with ADL.  Discussed potential goals when patient returns.        Shoulder Exercises: Supine   Horizontal ABduction  AROM;Strengthening;Both;10 reps;Theraband    Theraband Level (Shoulder Horizontal ABduction)  Level 1 (Yellow)    External Rotation  AROM;Strengthening;10 reps;Theraband    Theraband Level (Shoulder External Rotation)  Level 1 (Yellow)      Shoulder Exercises: Seated   Other Seated Exercises  Please review patient instructions             OT Education - 10/08/18 1543    Education Details  theraband exercises upper extremities    Person(s) Educated  Patient    Methods  Explanation;Demonstration;Handout     Comprehension  Verbalized understanding;Returned demonstration       OT Short Term Goals - 10/08/18 1547      OT SHORT TERM GOAL #1   Title  Patient will complete a home exercise program designed to improve AROM in Bilateral shoulders with min assist (due 07/07/18)    Time  4    Period  Weeks    Status  Achieved    Target Date  07/07/18      OT SHORT TERM GOAL #2   Title  Patient will complete a stand step transfer to commode with mod assist    Time  4    Period  Weeks    Status  Achieved      OT SHORT TERM GOAL #3   Title  Patient will demonstrate static stand with min assist with upper extremities out of support for 1 minute    Time  4    Period  Weeks    Status  Achieved      OT SHORT TERM GOAL #4   Title  Patient will transition from supine to sit with mod assist in preparation for return to regular (vs hospital) bed    Time  4    Period  Weeks    Status  Achieved      OT SHORT TERM GOAL #5   Title  Patient will begin graduated driving program - first will attempt to operate car in empty lot with another adult driver.      Time  4    Period  Weeks    Status  Achieved        OT Long Term Goals - 10/08/18 1548      OT LONG TERM GOAL #1   Title  Patient will complete an upgraded HEP designed to improve BUE strength and functional range of motion (due 09/11/18)    Time  8    Period  Weeks    Status  Achieved      OT LONG TERM GOAL #2   Title  Patient will be modified independent with toilet hygiene    Time  8    Period  Weeks    Status  Achieved      OT LONG TERM GOAL #3   Title  Patient will complete shower using tub transfer bench and min assist    Time  8    Period  Weeks    Status  Partially Met   Able to complete in clinic, and dry run at home, has never done actual shower     OT LONG TERM GOAL #4   Title  Patient will demonstrate adequate dynamic stand balance tolerance (5 min) to assist with simple  cooking or grilling task    Time  8    Period   Weeks    Status  Achieved      OT LONG TERM GOAL #5   Title  Patient will demonstrate ability to transition from sit to supine, supine to sit, and roll himself in regular bed in preparation for d/c HOSPITAL BED.      Time  8    Period  Weeks    Status  Achieved      OT LONG TERM GOAL #6   Title  Patient will return to driving    Time  8    Period  Weeks    Status  Achieved            Plan - 10/08/18 1544    Clinical Impression Statement  Patient agreeable to OT discharge, and will potentially return after 8 week break to further improve functional standing and functional use of UE's, and increase autonomy with ADL/IADL    Occupational Profile and client history currently impacting functional performance  Husband, father, grandfather, retired Chief Technology Officer, enjoys watching sports, water exercise class    Occupational performance deficits (Please refer to evaluation for details):  ADL's;IADL's;Rest and Sleep;Leisure    Rehab Potential  Good    Current Impairments/barriers affecting progress:  obesity    OT Frequency  2x / week    OT Duration  4 weeks    OT Treatment/Interventions  Self-care/ADL training;Aquatic Therapy;Therapeutic exercise;Functional Mobility Training;Balance training;Manual Therapy;Neuromuscular education;Therapeutic activities;Energy conservation;DME and/or AE instruction;Moist Heat;Cryotherapy;Patient/family education    Plan  Discharge    Clinical Decision Making  Several treatment options, min-mod task modification necessary    OT Home Exercise Plan  Reviewed HEP and added theraband UE exercises    Consulted and Agree with Plan of Care  Patient       Patient will benefit from skilled therapeutic intervention in order to improve the following deficits and impairments:  Decreased skin integrity, Decreased knowledge of precautions, Improper body mechanics, Decreased activity tolerance, Decreased knowledge of use of DME, Decreased strength, Impaired flexibility,  Decreased balance, Decreased mobility, Difficulty walking, Impaired sensation, Obesity, Decreased range of motion, Increased edema, Pain, Decreased coordination, Impaired UE functional use  Visit Diagnosis: Muscle weakness (generalized)  Unsteadiness on feet  Other disturbances of skin sensation  Stiffness of left shoulder, not elsewhere classified  Stiffness of right shoulder, not elsewhere classified    Problem List Patient Active Problem List   Diagnosis Date Noted  . Recurrent pulmonary embolism ([place])   . Pain of left heel   . C4 spinal cord injury, sequela ([place]) 12/18/2017  . Tetraplegia ([place]) 12/18/2017  . Aortic aneurysm ([place]) 12/16/2017  . Cord compression ([place]) 12/13/2017  . Chronic combined systolic and diastolic heart failure ([place]) 10/09/2016  . CAD S/P percutaneous coronary angioplasty 09/20/2016  . Erectile dysfunction 07/30/2015  . Morbid obesity with BMI of 50.0-59.9, adult ([place]) 07/18/2013  . History of pulmonary embolism 07/13/2013  . Angioedema of lips 07/13/2013  . Multinodular goiter 07/13/2013  . Dyslipidemia, goal LDL below 70 10/02/2011  . SOB (shortness of breath) 10/02/2011  . BPH with urinary obstruction 07/17/2010  . Diabetes mellitus type 2, controlled ([place]) 07/17/2010  . Bilateral lower extremity edema 12/08/2008  . NEUROPATHY, IDIOPATHIC PERIPHERAL NEC 05/31/2007  . Osteoarthritis 03/22/2007  . Gout 03/15/2007  . Essential hypertension 03/15/2007    OCCUPATIONAL THERAPY DISCHARGE SUMMARY  Visits from Start of Care: 19  Current functional level related to goals /  functional outcomes: Patient with significant improvement with ADL   Remaining deficits: Pain in right shoulder, limited range of motion shoulders,    Education / Equipment: HEP BUE Plan: Patient agrees to discharge.  Patient goals were met. Patient is being discharged due to meeting the stated rehab goals.  ?????     tGellert, Algernon Huxley , OTR/L 10/08/2018, 3:51  PM  [facility] [address] [place], [place], [address] Phone: [phone]   Fax:  [phone]  Name: Franklin Hicks MRN: [id number] Date of Birth: [birth year]

## 2018-10-08 NOTE — Patient Instructions (Signed)
Access Code: [email or web address]  URL: [email or web address]  Date: 10/08/2018  Prepared by: Merleen Milliner   Exercises  Supine Shoulder Horizontal Abduction with Resistance - 10 reps - 3 sets - 1x daily - 7x weekly  Supine Shoulder External Rotation with Resistance - 10 reps - 3 sets - 1x daily - 7x weekly  Seated Elbow Extension with Self-Anchored Resistance - 10 reps - 3 sets - 1x daily - 7x weekly  Seated Elbow Flexion with Self-Anchored Resistance - 10 reps - 3 sets - 1x daily - 7x weekly

## 2018-10-11 ENCOUNTER — Ambulatory Visit: Payer: Medicare Other | Admitting: Rehabilitative and Restorative Service Providers"

## 2018-10-11 ENCOUNTER — Encounter: Payer: Self-pay | Admitting: Rehabilitative and Restorative Service Providers"

## 2018-10-11 DIAGNOSIS — M6281 Muscle weakness (generalized): Secondary | ICD-10-CM

## 2018-10-11 DIAGNOSIS — R2681 Unsteadiness on feet: Secondary | ICD-10-CM

## 2018-10-11 DIAGNOSIS — R2689 Other abnormalities of gait and mobility: Secondary | ICD-10-CM

## 2018-10-11 NOTE — Therapy (Signed)
[facility] [address] [place], [place], [address] Phone: [phone]   Fax:  [phone]  Physical Therapy Treatment  Patient Details  Name: Franklin Hicks MRN: [id number] Date of Birth: 21-May-1951 Referring Provider (PT): Alger Simons, MD   Encounter Date: 10/11/2018  PT End of Session - 10/11/18 1150    Visit Number  26    Number of Visits  34    Date for PT Re-Evaluation  10/25/18    Authorization Type  $20 copay [employer] medicare    PT Start Time  1105    PT Stop Time  1152    PT Time Calculation (min)  47 min    Behavior During Therapy  [facility] for tasks assessed/performed       Past Medical History:  Diagnosis Date  . Arthritis    "knees" (12/17/2017)  . CAD S/P percutaneous coronary angioplasty 09/20/2016   Nstemi 08/2016. Dr. Doylene Canard. DES- brilinta and asa. Requests change to [facility] cardiology; 95% Ramus -> PCI Resolute Onyx DES 2.75 x 18  . Central cord syndrome ([place]) 12/17/2017  . Chronic combined systolic and diastolic heart failure ([place]) 10/09/2016   EF 45% and grade II diastolic after nstemi  . Diet-controlled diabetes mellitus ([place])   . DJD (degenerative joint disease)   . Gout   . High cholesterol   . Hypertension   . NSTEMI (non-ST elevated myocardial infarction) ([place]) 09/20/2016   95% Ramus - > PCI   . Obesity   . Recurrent pulmonary embolism ([place]) 11/'14; 4/'19   a) Bilateral segmental and subsegmental pulmonary emboli with mild RV Strain.; b) after fall with C-spine Fxr --> Acute PE of right main pulmonary artery extending into multiple segments.    Past Surgical History:  Procedure Laterality Date  . CARDIAC CATHETERIZATION N/A 09/19/2016   Procedure: Left Heart Cath and Coronary Angiography;  Surgeon: Dixie Dials, MD;  Location: [facility];  Service: Cardiovascular: 95% proximal Ramus Intermedius --> PCI  . CARDIAC CATHETERIZATION N/A 09/19/2016   Procedure: Coronary Stent Intervention;   Surgeon: Nelva Bush, MD;  Location: [facility];  Service: Cardiovascular: 95% ramus intermedius;  Resolute Onyx 2.75 x 18 mm drug-eluting stent  . CYSTOSCOPY/RETROGRADE/URETEROSCOPY/STONE EXTRACTION WITH BASKET  6606,3016   ureteral stone   . MENISCUS REPAIR Left    knee open meniscetomy  . TRANSTHORACIC ECHOCARDIOGRAM  2004   no lvh nl ejection fraction  . TRANSTHORACIC ECHOCARDIOGRAM  09/19/2016   In setting of NSTEMI:  EF 45-50% with diffuse hypokinesis. GR 2 DD. Mild biatrial enlargement.    There were no vitals filed for this visit.  Subjective Assessment - 10/11/18 1106    Subjective  The patient is doing HEP.  He notes he is walking into the kitchen some now.   He is using w/c versus walking in the house approximately 50% of the time.    The patient wants to ensure that PT knows he is not ready to finish therapy.  He is concerned that due to occasional cancels we would doubt his dedication to improving.  We discussed that I know he wants to keep making progress and we need to continue to show progress with household mobility.    Pertinent History  history of CAD with chronic combined CHF, T2DM, morbid obesity, hypertension, PE in the past; venous stasis ulcers.    Patient Stated Goals  Returning to greatest level of independence.  (prior status was short distance walking with 2 canes).  Currently in Pain?  No/denies                       [facility] Adult PT Treatment/Exercise - 10/11/18 1224      Ambulation/Gait   Ambulation/Gait  Yes    Ambulation/Gait Assistance  5: Supervision    Ambulation/Gait Assistance Details  The patient ambulated 350 feet nonstop today with RW!  He rested, then ambulated 60 feet to parallel bars and walked forward/backwards in parallel bars x 10 feet x 2 reps.  Then walked 60 feet back to mat.  PT provided cues for upright standing, firing gluts and dec'ing UE support on devices.    Ambulation Distance (Feet)  350 Feet     Assistive device  Rolling walker    Gait Pattern  Decreased stance time - left;Decreased step length - right;Decreased step length - left;Poor foot clearance - left;Poor foot clearance - right;Wide base of support;Trunk flexed;Shuffle    Ambulation Surface  Level;Indoor               PT Short Term Goals - 10/11/18 1223      PT SHORT TERM GOAL #1   Title  The patient will be indep with standing HEP near countertop.    Baseline  Patient has a couple of standing exercises prescribed for home + other stretching and seated exercises.  He is not performing ther ex regularly.    Time  4    Period  Weeks    Status  Partially Met      PT SHORT TERM GOAL #2   Title  The patient will ambulate x 350 ft nonstop to demo increasing ambulation distance for short community surface negotiation mod indep with RW.    Baseline  250 ft with RW today.  *Although after STG period, patient able to perform on 10/11/2018    Time  4    Period  Weeks    Status  Not Met      PT SHORT TERM GOAL #3   Title  The patient will be able to return to sitting on living room furniture and moving sit>stand to [place] from his chair in home.    Baseline  Patient continuing to use w/c due to needing his grandson's assist to get up from sofa.    Time  4    Period  Weeks    Status  Not Met      PT SHORT TERM GOAL #4   Title  The patient will negotiate 4 steps with bilateral handrail and min A.    Baseline  Have not progressed to stairs -- patient had eye injury limiting therapy x 2 weeks.    Time  4    Period  Weeks    Status  Not Met      PT SHORT TERM GOAL #5   Title  Improve gait speed from 0.61 ft/sec (on 08/26/18) up to 0.85 ft/sec to demo incrasing mobility.    Baseline  0.58 ft/sec (has improved from eval, but is plateauing).    Time  4    Period  Weeks    Status  Not Met        PT Long Term Goals - 08/26/18 1525      PT LONG TERM GOAL #1   Title  The patient will be indep with progression of HEP for post  d/c strengthening and mobility. (LTG due date 08/24/2018)    Time  8  Period  Days    Status  Revised    Target Date  10/25/18      PT LONG TERM GOAL #2   Title  The patient will ambulate x 500 ft nonstop with RW mod indep for short community surface negotiation.    Time  8    Period  Weeks    Status  New    Target Date  10/25/18      PT LONG TERM GOAL #3   Title  The patient will negotiate 4 steps with bilateral handrail or RW with supervision.    Time  8    Period  Weeks    Status  New    Target Date  10/25/18      PT LONG TERM GOAL #4   Title  The patient will improve gait speed from 0.61 ft/sec to > or equal to 1.0 ft/sec.    Time  8    Period  Weeks    Status  New    Target Date  09/25/18            Plan - 10/11/18 1232    Clinical Impression Statement  The patient walked 350 feet today nonstop with RW and supervision.  PT is continuing to work with the patient to increase household mobility with a goal to progress to tolerating stair negotiation (lives in a split level home) and limited community mobility.      PT Frequency  2x / week    PT Duration  4 weeks    PT Treatment/Interventions  ADLs/Self Care Home Management;Balance training;Neuromuscular re-education;Patient/family education;Gait training;Stair training;Functional mobility training;DME Instruction;Therapeutic activities;Therapeutic exercise    PT Next Visit Plan  Progress household gait (set up obstacles, sit<>stand and tight spaces); progress endurance, standing hip extension/glut firing for improved trunk upright, standing endurance/balance.    Consulted and Agree with Plan of Care  Patient       Patient will benefit from skilled therapeutic intervention in order to improve the following deficits and impairments:  Abnormal gait, Impaired sensation, Obesity, Decreased activity tolerance, Decreased balance, Decreased strength, Pain, Decreased mobility, Difficulty walking, Decreased range of motion,  Impaired flexibility, Postural dysfunction  Visit Diagnosis: Muscle weakness (generalized)  Unsteadiness on feet  Other abnormalities of gait and mobility     Problem List Patient Active Problem List   Diagnosis Date Noted  . Recurrent pulmonary embolism ([place])   . Pain of left heel   . C4 spinal cord injury, sequela ([place]) 12/18/2017  . Tetraplegia ([place]) 12/18/2017  . Aortic aneurysm ([place]) 12/16/2017  . Cord compression ([place]) 12/13/2017  . Chronic combined systolic and diastolic heart failure ([place]) 10/09/2016  . CAD S/P percutaneous coronary angioplasty 09/20/2016  . Erectile dysfunction 07/30/2015  . Morbid obesity with BMI of 50.0-59.9, adult ([place]) 07/18/2013  . History of pulmonary embolism 07/13/2013  . Angioedema of lips 07/13/2013  . Multinodular goiter 07/13/2013  . Dyslipidemia, goal LDL below 70 10/02/2011  . SOB (shortness of breath) 10/02/2011  . BPH with urinary obstruction 07/17/2010  . Diabetes mellitus type 2, controlled ([place]) 07/17/2010  . Bilateral lower extremity edema 12/08/2008  . NEUROPATHY, IDIOPATHIC PERIPHERAL NEC 05/31/2007  . Osteoarthritis 03/22/2007  . Gout 03/15/2007  . Essential hypertension 03/15/2007    Zeda Gangwer, PT 10/11/2018, 4:29 PM  [facility] [address] [place], [place], [address] Phone: [phone]   Fax:  [phone]  Name: RONDEL EPISCOPO MRN: [id number] Date of Birth: 06-07-1951

## 2018-10-13 ENCOUNTER — Encounter: Payer: Medicare Other | Attending: Physical Medicine & Rehabilitation | Admitting: Physical Medicine & Rehabilitation

## 2018-10-13 ENCOUNTER — Encounter: Payer: Self-pay | Admitting: Physical Medicine & Rehabilitation

## 2018-10-13 VITALS — BP 135/78 | HR 71 | Ht 72.0 in | Wt 360.0 lb

## 2018-10-13 DIAGNOSIS — Z6841 Body Mass Index (BMI) 40.0 and over, adult: Secondary | ICD-10-CM | POA: Diagnosis not present

## 2018-10-13 DIAGNOSIS — I2510 Atherosclerotic heart disease of native coronary artery without angina pectoris: Secondary | ICD-10-CM | POA: Insufficient documentation

## 2018-10-13 DIAGNOSIS — Z7901 Long term (current) use of anticoagulants: Secondary | ICD-10-CM | POA: Insufficient documentation

## 2018-10-13 DIAGNOSIS — L723 Sebaceous cyst: Secondary | ICD-10-CM

## 2018-10-13 DIAGNOSIS — G8250 Quadriplegia, unspecified: Secondary | ICD-10-CM

## 2018-10-13 DIAGNOSIS — M170 Bilateral primary osteoarthritis of knee: Secondary | ICD-10-CM | POA: Diagnosis not present

## 2018-10-13 DIAGNOSIS — S14104S Unspecified injury at C4 level of cervical spinal cord, sequela: Secondary | ICD-10-CM | POA: Diagnosis not present

## 2018-10-13 DIAGNOSIS — Z87891 Personal history of nicotine dependence: Secondary | ICD-10-CM | POA: Diagnosis not present

## 2018-10-13 DIAGNOSIS — M542 Cervicalgia: Secondary | ICD-10-CM | POA: Diagnosis not present

## 2018-10-13 DIAGNOSIS — N319 Neuromuscular dysfunction of bladder, unspecified: Secondary | ICD-10-CM | POA: Insufficient documentation

## 2018-10-13 DIAGNOSIS — I252 Old myocardial infarction: Secondary | ICD-10-CM | POA: Insufficient documentation

## 2018-10-13 DIAGNOSIS — I110 Hypertensive heart disease with heart failure: Secondary | ICD-10-CM | POA: Insufficient documentation

## 2018-10-13 DIAGNOSIS — Z7902 Long term (current) use of antithrombotics/antiplatelets: Secondary | ICD-10-CM | POA: Diagnosis not present

## 2018-10-13 DIAGNOSIS — N400 Enlarged prostate without lower urinary tract symptoms: Secondary | ICD-10-CM | POA: Insufficient documentation

## 2018-10-13 DIAGNOSIS — M1710 Unilateral primary osteoarthritis, unspecified knee: Secondary | ICD-10-CM | POA: Insufficient documentation

## 2018-10-13 DIAGNOSIS — I5042 Chronic combined systolic (congestive) and diastolic (congestive) heart failure: Secondary | ICD-10-CM | POA: Insufficient documentation

## 2018-10-13 DIAGNOSIS — Z5181 Encounter for therapeutic drug level monitoring: Secondary | ICD-10-CM | POA: Diagnosis not present

## 2018-10-13 DIAGNOSIS — S14104A Unspecified injury at C4 level of cervical spinal cord, initial encounter: Secondary | ICD-10-CM | POA: Diagnosis not present

## 2018-10-13 DIAGNOSIS — E7800 Pure hypercholesterolemia, unspecified: Secondary | ICD-10-CM | POA: Diagnosis not present

## 2018-10-13 DIAGNOSIS — E1140 Type 2 diabetes mellitus with diabetic neuropathy, unspecified: Secondary | ICD-10-CM | POA: Insufficient documentation

## 2018-10-13 DIAGNOSIS — Z86711 Personal history of pulmonary embolism: Secondary | ICD-10-CM | POA: Insufficient documentation

## 2018-10-13 DIAGNOSIS — M109 Gout, unspecified: Secondary | ICD-10-CM | POA: Diagnosis not present

## 2018-10-13 NOTE — Patient Instructions (Signed)
  LEGS:  EUCERIN CREAM DAILY TO TWICE DAILY  ELEVATE LEGS WHEN YOU CAN  COMPRESSION OF LEGS TO DECREASE SWELLING (ACE WRAP FOR  EXAMPLE)  WEIGHT LOSS    SEBACEOUS CYST:  SCRUB WITH CLEAN RAG, PERHAPS WITH A LITTLE SOAP DAILY  CLEAN WITH MOIST RAG AND THEN DRY.   KEEP IT AS DRY AS POSSIBLE

## 2018-10-13 NOTE — Progress Notes (Signed)
Subjective:    Patient ID: Franklin Hicks, male    DOB: 1950-09-03, 68 y.o.   MRN: [id number]  HPI   Franklin Hicks is back regarding his C4 spinal cord injury. He has continued with outpt therapies. He graduated from OT. PT goes through mid March. He is back in his regular bed at home. He uses his walker about 50% of the time at home now. His goals is for 90% of time. He doesn't do more because he is stiff and his backs/legs hurt when he first stands up----that psychologically is a barrier for him.   He has had an ongoing wound in his left hip/buttock. He feels that with transfers from the chair, he might be aggravating the area. It has been associated with bleeding, sometimes soaking his shorts. He may go a few weeks between breakdown in the area. He saw a few drops on the floor last night after he transferred from his w/c to bed. He remains on xarelto for anticoagulation.   For pain he remains on gabapentin and voltaren gel which provide reasonable relief of his pain.      Pain Inventory Average Pain 3 Pain Right Now 2 My pain is burning and aching  In the last 24 hours, has pain interfered with the following? General activity 0 Relation with others 0 Enjoyment of life 0 What TIME of day is your pain at its worst? morning Sleep (in general) Fair  Pain is worse with: walking and standing Pain improves with: rest and pacing activities Relief from Meds: 7  Mobility use a cane ability to climb steps?  no do you drive?  yes use a wheelchair transfers alone  Function retired  Neuro/Psych No problems in this area  Prior Studies Any changes since last visit?  no  Physicians involved in your care Any changes since last visit?  no   Family History  Problem Relation Age of Onset  . Liver disease Mother    Social History   Socioeconomic History  . Marital status: Married    Spouse name: Not on file  . Number of children: Not on file  . Years of education: Not on file  .  Highest education level: Not on file  Occupational History  . Occupation: Runner, broadcasting/film/video  Social Needs  . Financial resource strain: Not on file  . Food insecurity:    Worry: Not on file    Inability: Not on file  . Transportation needs:    Medical: Not on file    Non-medical: Not on file  Tobacco Use  . Smoking status: Former Smoker    Packs/day: 1.00    Years: 13.00    Pack years: 13.00    Types: Cigarettes    Last attempt to quit: 07/13/1977    Years since quitting: 41.2  . Smokeless tobacco: Never Used  Substance and Sexual Activity  . Alcohol use: Yes    Alcohol/week: 0.0 standard drinks    Comment: holidays only  . Drug use: No  . Sexual activity: Not on file  Lifestyle  . Physical activity:    Days per week: Not on file    Minutes per session: Not on file  . Stress: Not on file  Relationships  . Social connections:    Talks on phone: Not on file    Gets together: Not on file    Attends religious service: Not on file    Active member of club or organization: Not on file  Attends meetings of clubs or organizations: Not on file    Relationship status: Not on file  Other Topics Concern  . Not on file  Social History Narrative   Married for 44 years. He has 3 children and 5 grandchildren. They also 5 great grandchildren. He lives with his wife. 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 -> [employer]   Occasional beer or wine   No smoking history or illicit drug use.   One hour water aerobics sessions 3 days a week. Has not started back yet read from recent illness sounds.   Past Surgical History:  Procedure Laterality Date  . CARDIAC CATHETERIZATION N/A 09/19/2016   Procedure: Left Heart Cath and Coronary Angiography;  Surgeon: Orpah CobbAjay Kadakia, MD;  Location: [facility] INVASIVE CV LAB;  Service: Cardiovascular: 95% proximal Ramus Intermedius --> PCI  . CARDIAC CATHETERIZATION N/A 09/19/2016   Procedure: Coronary Stent Intervention;  Surgeon:  Yvonne Kendallhristopher End, MD;  [facility];  Service: Cardiovascular: 95% ramus intermedius;  Resolute Onyx 2.75 x 18 mm drug-eluting stent  . CYSTOSCOPY/RETROGRADE/URETEROSCOPY/STONE EXTRACTION WITH BASKET  6962,[id number]   ureteral stone   . MENISCUS REPAIR Left    knee open meniscetomy  . TRANSTHORACIC ECHOCARDIOGRAM  2004   no lvh nl ejection fraction  . TRANSTHORACIC ECHOCARDIOGRAM  09/19/2016   In setting of NSTEMI:  EF 45-50% with diffuse hypokinesis. GR 2 DD. Mild biatrial enlargement.   Past Medical History:  Diagnosis Date  . Arthritis    "knees" (12/17/2017)  . CAD S/P percutaneous coronary angioplasty 09/20/2016   Nstemi 08/2016. Dr. Algie CofferKadakia. DES- brilinta and asa. [facility]; 95% Ramus -> PCI Resolute Onyx DES 2.75 x 18  . Central cord syndrome (HCC) 12/17/2017  . Chronic combined systolic and diastolic heart failure (HCC) 10/09/2016   EF 45% and grade II diastolic after nstemi  . Diet-controlled diabetes mellitus (HCC)   . DJD (degenerative joint disease)   . Gout   . High cholesterol   . Hypertension   . NSTEMI (non-ST elevated myocardial infarction) (HCC) 09/20/2016   95% Ramus - > PCI   . Obesity   . Recurrent pulmonary embolism (HCC) 11/'14; 4/'19   a) Bilateral segmental and subsegmental pulmonary emboli with mild RV Strain.; b) after fall with C-spine Fxr --> Acute PE of right main pulmonary artery extending into multiple segments.   BP 135/78   Pulse 71   Ht 6' (1.829 m)   Wt (!) 360 lb (163.3 kg)   SpO2 94%   BMI 48.82 kg/m   Opioid Risk Score:   Fall Risk Score:  `1  Depression screen PHQ 2/9  [facility] 07/13/2018 07/01/2018 04/12/2018 03/10/2018 12/16/2016 10/09/2016 12/13/2014  Decreased Interest 0 0 0 0 0 0 0  Down, Depressed, Hopeless 0 0 0 0 0 0 0  PHQ - 2 Score 0 0 0 0 0 0 0     Review of Systems  Constitutional: Negative.   HENT: Negative.   Eyes: Negative.   Respiratory: Negative.   Cardiovascular:  Negative.   Gastrointestinal: Negative.   Endocrine: Negative.   Genitourinary: Negative.   Musculoskeletal: Positive for arthralgias and myalgias.  Skin: Negative.   Allergic/Immunologic: Negative.   Neurological: Negative.   Hematological: Bruises/bleeds easily.  Psychiatric/Behavioral: Negative.   All other systems reviewed and are negative.      Objective:   Physical Exam General: No acute distress HEENT: EOMI, oral membranes moist Cards: reg rate  Chest: normal effort Abdomen: Soft, NT, ND Skin: dry, intact Extremities: 1+ to 2+ edema bilateral lower extremities Neurological: Upper Extremities: Decreased ROM and Muscle Strength 90 Degrees and Muscle Strength 5/5 Lower Extremities: Full ROM and Muscle Strength 4-5/5.  Sensation 1+ to 2 out to both lower extremities. Arrived in wheelchair.  Able to pull himself up to a standing position by holding edge of the counter/sink. Neurological: He is alert and oriented to person, place, and time.  Skin:  Above area is located on the upper left thigh just below the buttock.  I attempted to express any contents from this area and nothing came forth.  Patient experienced only mild discomfort during the pinch.     Both lower legs are dry with scaling skin and chronic venous stasis changes.    Psychiatric: He has a normal mood and affect. His behavior is normal.  Nursing note and vitals reviewed.         Assessment & Plan:  1. Functional Deficits Secondary to C4 Spinal Cord Injury:  Continue with outpt  Physical Therapy.  -discussed HEP  -think he could benefit from an aquatic based approach to help further transition him from his w/c to his walker  2. Neuropathic: Continue Gabapentin  3. Bilateral OA of Bilateral Knees : Continue  Voltaren Gel  -weight loss was discussed 4. Morbid Obesity: Continue with Healthy Diet Regimen 5. Skin:   -sebaceous cyst left upper thigh--recommend daily cleaning of area with mild soap  and water, rinse, and then keep area dry after area is cleaned off.    -right now the area is very small. Is not infected.    -close observation for now  -LE dry skin   -regular eucerin cream   -edema control with elevation and compression   -weight loss   30 minutes of face to face patient care time was spent during this visit. All questions was encouraged and answered.   F/U in 3 months with rehab MD

## 2018-10-14 ENCOUNTER — Telehealth: Payer: Self-pay | Admitting: Rehabilitative and Restorative Service Providers"

## 2018-10-14 NOTE — Telephone Encounter (Signed)
Called [employer] medicare to determine benefit for the patient for aquatic therapy.   Spoke with Meriam Sprague to verify if [facility] medicare would cover CPT code 34917.    There is no prior auth or referral provided and a $20 copay for each visit.   Called provider line @ [phone].  Joua Bake, PT

## 2018-10-15 ENCOUNTER — Ambulatory Visit: Payer: Medicare Other | Admitting: Rehabilitative and Restorative Service Providers"

## 2018-10-20 ENCOUNTER — Encounter: Payer: Self-pay | Admitting: Physical Therapy

## 2018-10-20 ENCOUNTER — Ambulatory Visit: Payer: Medicare Other | Admitting: Physical Therapy

## 2018-10-20 DIAGNOSIS — M6281 Muscle weakness (generalized): Secondary | ICD-10-CM | POA: Diagnosis not present

## 2018-10-20 DIAGNOSIS — R2681 Unsteadiness on feet: Secondary | ICD-10-CM

## 2018-10-20 DIAGNOSIS — R2689 Other abnormalities of gait and mobility: Secondary | ICD-10-CM

## 2018-10-20 NOTE — Therapy (Signed)
[facility] [address] [place], [place], [address] Phone: [phone]   Fax:  [phone]  Physical Therapy Treatment  Patient Details  Name: Franklin Hicks MRN: [id number] Date of Birth: 01-29-1951 Referring Provider (PT): Alger Simons, MD   Encounter Date: 10/20/2018  PT End of Session - 10/20/18 9480    Visit Number  27    Number of Visits  34    Date for PT Re-Evaluation  10/25/18    Authorization Type  $20 copay [employer] medicare    PT Start Time  1152    PT Stop Time  1231    PT Time Calculation (min)  39 min    Behavior During Therapy  [facility] for tasks assessed/performed       Past Medical History:  Diagnosis Date  . Arthritis    "knees" (12/17/2017)  . CAD S/P percutaneous coronary angioplasty 09/20/2016   Nstemi 08/2016. Dr. Doylene Canard. DES- brilinta and asa. Requests change to [facility] cardiology; 95% Ramus -> PCI Resolute Onyx DES 2.75 x 18  . Central cord syndrome ([place]) 12/17/2017  . Chronic combined systolic and diastolic heart failure ([place]) 10/09/2016   EF 45% and grade II diastolic after nstemi  . Diet-controlled diabetes mellitus ([place])   . DJD (degenerative joint disease)   . Gout   . High cholesterol   . Hypertension   . NSTEMI (non-ST elevated myocardial infarction) ([place]) 09/20/2016   95% Ramus - > PCI   . Obesity   . Recurrent pulmonary embolism ([place]) 11/'14; 4/'19   a) Bilateral segmental and subsegmental pulmonary emboli with mild RV Strain.; b) after fall with C-spine Fxr --> Acute PE of right main pulmonary artery extending into multiple segments.    Past Surgical History:  Procedure Laterality Date  . CARDIAC CATHETERIZATION N/A 09/19/2016   Procedure: Left Heart Cath and Coronary Angiography;  Surgeon: Dixie Dials, MD;  Location: [facility] CV LAB;  Service: Cardiovascular: 95% proximal Ramus Intermedius --> PCI  . CARDIAC CATHETERIZATION N/A 09/19/2016   Procedure: Coronary Stent Intervention;   Surgeon: Nelva Bush, MD;  Location: [facility] CV LAB;  Service: Cardiovascular: 95% ramus intermedius;  Resolute Onyx 2.75 x 18 mm drug-eluting stent  . CYSTOSCOPY/RETROGRADE/URETEROSCOPY/STONE EXTRACTION WITH BASKET  1655,3748   ureteral stone   . MENISCUS REPAIR Left    knee open meniscetomy  . TRANSTHORACIC ECHOCARDIOGRAM  2004   no lvh nl ejection fraction  . TRANSTHORACIC ECHOCARDIOGRAM  09/19/2016   In setting of NSTEMI:  EF 45-50% with diffuse hypokinesis. GR 2 DD. Mild biatrial enlargement.    There were no vitals filed for this visit.  Subjective Assessment - 10/20/18 1218    Subjective  Walking more at home with walker, sometimes with and sometimes without w/c following .    Currently in Pain?  Yes    Pain Score  3     Pain Location  Knee    Pain Orientation  Right;Left    Pain Descriptors / Indicators  Aching    Pain Type  Chronic pain    Pain Onset  More than a month ago    Pain Frequency  Constant    Multiple Pain Sites  Yes    Pain Score  2    Pain Location  Back    Pain Orientation  Lower    Pain Descriptors / Indicators  Aching    Pain Onset  More than a month ago    Pain Frequency  Constant  [facility] PT Treatment/Exercise - 10/20/18 0001      Transfers   Transfers  Sit to Stand;Stand to Sit    Sit to Stand  6: Modified independent (Device/Increase time)      Ambulation/Gait   Ambulation/Gait  Yes    Ambulation/Gait Assistance  6: Modified independent (Device/Increase time)    Ambulation/Gait Assistance Details  Working on activity tolerance    Ambulation Distance (Feet)  380 Feet    Assistive device  Rolling walker    Gait Pattern  Decreased stance time - left;Decreased step length - right;Decreased step length - left;Poor foot clearance - left;Poor foot clearance - right;Wide base of support;Trunk flexed;Shuffle    Ambulation Surface  Level;Indoor    Pre-Gait Activities  In Parallel bars: 6" step ups x4  with 2 UE support             PT Education - 10/20/18 1302    Education Details  Discussed recommedation to continue to work on standing balance and endurance at home to increase activtiy tolerance.  Discussed POC including aquatic therapy and pt is agreeable.    Person(s) Educated  Patient    Methods  Explanation    Comprehension  Verbalized understanding       PT Short Term Goals - 10/11/18 1223      PT SHORT TERM GOAL #1   Title  The patient will be indep with standing HEP near countertop.    Baseline  Patient has a couple of standing exercises prescribed for home + other stretching and seated exercises.  He is not performing ther ex regularly.    Time  4    Period  Weeks    Status  Partially Met      PT SHORT TERM GOAL #2   Title  The patient will ambulate x 350 ft nonstop to demo increasing ambulation distance for short community surface negotiation mod indep with RW.    Baseline  250 ft with RW today.  *Although after STG period, patient able to perform on 10/11/2018    Time  4    Period  Weeks    Status  Not Met      PT SHORT TERM GOAL #3   Title  The patient will be able to return to sitting on living room furniture and moving sit>stand to [place] from his chair in home.    Baseline  Patient continuing to use w/c due to needing his grandson's assist to get up from sofa.    Time  4    Period  Weeks    Status  Not Met      PT SHORT TERM GOAL #4   Title  The patient will negotiate 4 steps with bilateral handrail and min A.    Baseline  Have not progressed to stairs -- patient had eye injury limiting therapy x 2 weeks.    Time  4    Period  Weeks    Status  Not Met      PT SHORT TERM GOAL #5   Title  Improve gait speed from 0.61 ft/sec (on 08/26/18) up to 0.85 ft/sec to demo incrasing mobility.    Baseline  0.58 ft/sec (has improved from eval, but is plateauing).    Time  4    Period  Weeks    Status  Not Met        PT Long Term Goals - 10/20/18 1316      PT  LONG TERM GOAL #  1   Title  The patient will be indep with progression of HEP for post d/c strengthening and mobility. (LTG due date 08/24/2018)    Time  8    Period  Days    Status  Revised      PT LONG TERM GOAL #2   Title  The patient will ambulate x 500 ft nonstop with RW mod indep for short community surface negotiation.    Time  8    Period  Weeks    Status  Not Met      PT LONG TERM GOAL #3   Title  The patient will negotiate 4 steps with bilateral handrail or RW with supervision.    Time  8    Period  Weeks    Status  Not Met      PT LONG TERM GOAL #4   Title  The patient will improve gait speed from 0.61 ft/sec to > or equal to 1.0 ft/sec.    Time  8    Period  Weeks    Status  New            Plan - 10/20/18 1306    Clinical Impression Statement  Skilled session focused on activity tolerance with community gait distance using RW, step training in the parallel bars, and discussing POC.  Pt is making gradual progresss with gait distance at 380Ft and would like to work towards walking into church.  Pt made gradual progress with step negotiation performing 4 steps ups on 6 " block in parallel bars.  Pt has not performed stair negotiation due to safety reporting LE fatigue and feeling like his knee is going to buckle.  Pt did not meet LTGs  #2, 3.                                                         PT Frequency  2x / week    PT Duration  4 weeks    PT Treatment/Interventions  ADLs/Self Care Home Management;Balance training;Neuromuscular re-education;Patient/family education;Gait training;Stair training;Functional mobility training;DME Instruction;Therapeutic activities;Therapeutic exercise    PT Next Visit Plan  check LTGs    Consulted and Agree with Plan of Care  Patient       Patient will benefit from skilled therapeutic intervention in order to improve the following deficits and impairments:  Abnormal gait, Impaired sensation, Obesity, Decreased activity tolerance,  Decreased balance, Decreased strength, Pain, Decreased mobility, Difficulty walking, Decreased range of motion, Impaired flexibility, Postural dysfunction  Visit Diagnosis: Muscle weakness (generalized)  Unsteadiness on feet  Other abnormalities of gait and mobility     Problem List Patient Active Problem List   Diagnosis Date Noted  . Recurrent pulmonary embolism ([place])   . Pain of left heel   . C4 spinal cord injury, sequela ([place]) 12/18/2017  . Tetraplegia ([place]) 12/18/2017  . Aortic aneurysm ([place]) 12/16/2017  . Cord compression ([place]) 12/13/2017  . Chronic combined systolic and diastolic heart failure ([place]) 10/09/2016  . CAD S/P percutaneous coronary angioplasty 09/20/2016  . Erectile dysfunction 07/30/2015  . Morbid obesity with BMI of 50.0-59.9, adult ([place]) 07/18/2013  . History of pulmonary embolism 07/13/2013  . Angioedema of lips 07/13/2013  . Multinodular goiter 07/13/2013  . Dyslipidemia, goal LDL below 70 10/02/2011  . SOB (shortness of breath) 10/02/2011  .  BPH with urinary obstruction 07/17/2010  . Diabetes mellitus type 2, controlled ([place]) 07/17/2010  . Bilateral lower extremity edema 12/08/2008  . NEUROPATHY, IDIOPATHIC PERIPHERAL NEC 05/31/2007  . Osteoarthritis 03/22/2007  . Gout 03/15/2007  . Essential hypertension 03/15/2007    Bjorn Loser, PTA  10/20/18, 1:19 PM [facility] [address] [place], [place], [address] Phone: [phone]   Fax:  [phone]  Name: Franklin Hicks MRN: [id number] Date of Birth: 1950/12/14

## 2018-10-25 ENCOUNTER — Encounter: Payer: Self-pay | Admitting: Rehabilitative and Restorative Service Providers"

## 2018-10-25 ENCOUNTER — Ambulatory Visit
Payer: Medicare Other | Attending: Physical Medicine & Rehabilitation | Admitting: Rehabilitative and Restorative Service Providers"

## 2018-10-25 DIAGNOSIS — R2681 Unsteadiness on feet: Secondary | ICD-10-CM | POA: Insufficient documentation

## 2018-10-25 DIAGNOSIS — M6281 Muscle weakness (generalized): Secondary | ICD-10-CM

## 2018-10-25 DIAGNOSIS — R2689 Other abnormalities of gait and mobility: Secondary | ICD-10-CM | POA: Diagnosis present

## 2018-10-25 NOTE — Therapy (Signed)
[facility] [address] [place], [place], [address] Phone: [phone]   Fax:  [phone]  Physical Therapy Treatment and Goal Update  Patient Details  Name: Franklin Hicks MRN: [id number] Date of Birth: Jan 22, 1951 Referring Provider (PT): Alger Simons, MD   Encounter Date: 10/25/2018  PT End of Session - 10/25/18 7564    Visit Number  28    Number of Visits  44    Date for PT Re-Evaluation  12/24/18    Authorization Type  $20 copay [employer] medicare    PT Start Time  1233    PT Stop Time  1319    PT Time Calculation (min)  46 min    Activity Tolerance  Patient tolerated treatment well    Behavior During Therapy  [facility] for tasks assessed/performed       Past Medical History:  Diagnosis Date  . Arthritis    "knees" (12/17/2017)  . CAD S/P percutaneous coronary angioplasty 09/20/2016   Nstemi 08/2016. Dr. Doylene Canard. DES- brilinta and asa. Requests change to [facility] cardiology; 95% Ramus -> PCI Resolute Onyx DES 2.75 x 18  . Central cord syndrome ([place]) 12/17/2017  . Chronic combined systolic and diastolic heart failure ([place]) 10/09/2016   EF 45% and grade II diastolic after nstemi  . Diet-controlled diabetes mellitus ([place])   . DJD (degenerative joint disease)   . Gout   . High cholesterol   . Hypertension   . NSTEMI (non-ST elevated myocardial infarction) ([place]) 09/20/2016   95% Ramus - > PCI   . Obesity   . Recurrent pulmonary embolism ([place]) 11/'14; 4/'19   a) Bilateral segmental and subsegmental pulmonary emboli with mild RV Strain.; b) after fall with C-spine Fxr --> Acute PE of right main pulmonary artery extending into multiple segments.    Past Surgical History:  Procedure Laterality Date  . CARDIAC CATHETERIZATION N/A 09/19/2016   Procedure: Left Heart Cath and Coronary Angiography;  Surgeon: Dixie Dials, MD;  Location: [place] CV LAB;  Service: Cardiovascular: 95% proximal Ramus Intermedius --> PCI  . CARDIAC  CATHETERIZATION N/A 09/19/2016   Procedure: Coronary Stent Intervention;  Surgeon: Nelva Bush, MD;  Location: [place] CV [facility];  Service: Cardiovascular: 95% ramus intermedius;  Resolute Onyx 2.75 x 18 mm drug-eluting stent  . CYSTOSCOPY/RETROGRADE/URETEROSCOPY/STONE EXTRACTION WITH BASKET  3329,5188   ureteral stone   . MENISCUS REPAIR Left    knee open meniscetomy  . TRANSTHORACIC ECHOCARDIOGRAM  2004   no lvh nl ejection fraction  . TRANSTHORACIC ECHOCARDIOGRAM  09/19/2016   In setting of NSTEMI:  EF 45-50% with diffuse hypokinesis. GR 2 DD. Mild biatrial enlargement.    There were no vitals filed for this visit.  Subjective Assessment - 10/25/18 1238    Subjective  Patient and PT discussed pool and concerns he will be penalized for not being able to attend in bad weather.  He showed PT pictures on his phone of his ramp noting how long it is and how this ends up getting him soaked before he can get into the car.  Patient is walking 55-60% of the time at home versus using w/c.     Pertinent History  history of CAD with chronic combined CHF, T2DM, morbid obesity, hypertension, PE in the past; venous stasis ulcers.    Patient Stated Goals  Returning to greatest level of independence.  (prior status was short distance walking with 2 canes).    Currently in Pain?  Yes  Pain Score  --   not rated today   Pain Location  Knee                       [facility] Adult PT Treatment/Exercise - 10/25/18 1508      Ambulation/Gait   Ambulation/Gait  Yes    Ambulation/Gait Assistance  6: Modified independent (Device/Increase time)    Ambulation Distance (Feet)  500 Feet    Assistive device  Rolling walker    Gait Pattern  Decreased stance time - left;Wide base of support;Trunk flexed    Ambulation Surface  Level;Indoor    Stairs  Yes    Stairs Assistance  4: Min guard    Stair Management Technique  Two rails;Step to pattern    Number of Stairs  1    Height of Stairs  6     Gait Comments  Patient stood with bilat UE support.  He has hesitation to lift R LE onto step due to not trusting L leg stance.   PT demonstrated side stepping with both hands on the L side of the stairs, however patient was able to perform 1 step using handrails and leading with R LE.      Self-Care   Self-Care  Other Self-Care Comments    Other Self-Care Comments   Discussed goals for PT.               PT Short Term Goals - 10/11/18 1223      PT SHORT TERM GOAL #1   Title  The patient will be indep with standing HEP near countertop.    Baseline  Patient has a couple of standing exercises prescribed for home + other stretching and seated exercises.  He is not performing ther ex regularly.    Time  4    Period  Weeks    Status  Partially Met      PT SHORT TERM GOAL #2   Title  The patient will ambulate x 350 ft nonstop to demo increasing ambulation distance for short community surface negotiation mod indep with RW.    Baseline  250 ft with RW today.  *Although after STG period, patient able to perform on 10/11/2018    Time  4    Period  Weeks    Status  Not Met      PT SHORT TERM GOAL #3   Title  The patient will be able to return to sitting on living room furniture and moving sit>stand to [place] from his chair in home.    Baseline  Patient continuing to use w/c due to needing his grandson's assist to get up from sofa.    Time  4    Period  Weeks    Status  Not Met      PT SHORT TERM GOAL #4   Title  The patient will negotiate 4 steps with bilateral handrail and min A.    Baseline  Have not progressed to stairs -- patient had eye injury limiting therapy x 2 weeks.    Time  4    Period  Weeks    Status  Not Met      PT SHORT TERM GOAL #5   Title  Improve gait speed from 0.61 ft/sec (on 08/26/18) up to 0.85 ft/sec to demo incrasing mobility.    Baseline  0.58 ft/sec (has improved from eval, but is plateauing).    Time  4    Period  Weeks  Status  Not Met        PT  Long Term Goals - 10/25/18 1306      PT LONG TERM GOAL #1   Title  The patient will be indep with progression of HEP for post d/c strengthening and mobility. (LTG due date 08/24/2018)    Time  8    Period  Days    Status  Revised      PT LONG TERM GOAL #2   Title  The patient will ambulate x 500 ft nonstop with RW mod indep for short community surface negotiation.    Baseline  Met on 10/25/2018 with patient ambulating 500 ft nonstop with RW mod indep (PT was nearby for supervision, but no assist needed).    Time  8    Period  Weeks    Status  Achieved      PT LONG TERM GOAL #3   Title  The patient will negotiate 4 steps with bilateral handrail or RW with supervision.    Baseline  The patient was able to step up 1 step and step back down 1 step with bilat handrails and CGA to supervision.    Time  8    Period  Weeks    Status  Not Met      PT LONG TERM GOAL #4   Title  The patient will improve gait speed from 0.61 ft/sec to > or equal to 1.0 ft/sec.    Baseline  1.06 ft/sec    Time  8    Period  Weeks    Status  Achieved        UPDATED SHORT AND LONG TERM GOALS:  PT Short Term Goals - 10/25/18 1517      PT SHORT TERM GOAL #1   Title  The patient will be able to walk into physical therapy clinic with RW mod indep (no use of w/c into PT clinic).    Time  4    Period  Weeks    Status  New    Target Date  11/24/18      PT SHORT TERM GOAL #2   Title  The patient will report walking in the home for 90% of daily mobility and only using w/c for 10% or less of the time.    Time  4    Period  Weeks    Status  New    Target Date  11/24/18      PT SHORT TERM GOAL #3   Title  The patient will negotiate 4 steps with bilateral handrails or one handrail and 1 cane with supervision.    Time  4    Period  Weeks    Status  New    Target Date  11/24/18      PT SHORT TERM GOAL #4   Title  The patient will improve gait speed from 1.06 ft/sec to > or equal to 1.31 ft/sec to demo  improving community mobility.    Time  4    Period  Weeks    Status  New    Target Date  11/24/18      PT Long Term Goals - 10/25/18 1520      PT LONG TERM GOAL #1   Title  The patient will be indep with progression of HEP for post d/c strengthening and mobility. (LTG due date 12/24/2018)    Time  8    Period  Weeks    Status  Revised  Target Date  12/24/18      PT LONG TERM GOAL #2   Title  The patient will be able to access the pool and participate in HEP in water for home exercise program.    Time  8    Period  Weeks    Status  New    Target Date  12/24/18      PT LONG TERM GOAL #3   Title  The patient will be able to verbalize ability to walk into restaurant, store to demonstrate improved access to the community.    Time  8    Period  Weeks    Status  New    Target Date  12/24/18          Plan - 10/25/18 1513    Clinical Impression Statement  The patient met gait speed and distance goal.  He was able to step up 1 step today, but has not completed all 4 steps.  PT and patient discussed STGs/LTGs-- see updated plan of care.    PT Treatment/Interventions  ADLs/Self Care Home Management;Balance training;Neuromuscular re-education;Patient/family education;Gait training;Stair training;Functional mobility training;DME Instruction;Therapeutic activities;Therapeutic exercise;Aquatic Therapy    PT Next Visit Plan  Walking into the clinic from parking lot, steps (begin with bottom 1-2 steps), walking more at home.    Consulted and Agree with Plan of Care  Patient       Patient will benefit from skilled therapeutic intervention in order to improve the following deficits and impairments:  Abnormal gait, Impaired sensation, Obesity, Decreased activity tolerance, Decreased balance, Decreased strength, Pain, Decreased mobility, Difficulty walking, Decreased range of motion, Impaired flexibility, Postural dysfunction  Visit Diagnosis: Muscle weakness (generalized)  Unsteadiness on  feet  Other abnormalities of gait and mobility     Problem List Patient Active Problem List   Diagnosis Date Noted  . Recurrent pulmonary embolism ([place])   . Pain of left heel   . C4 spinal cord injury, sequela ([place]) 12/18/2017  . Tetraplegia ([place]) 12/18/2017  . Aortic aneurysm ([place]) 12/16/2017  . Cord compression ([place]) 12/13/2017  . Chronic combined systolic and diastolic heart failure ([place]) 10/09/2016  . CAD S/P percutaneous coronary angioplasty 09/20/2016  . Erectile dysfunction 07/30/2015  . Morbid obesity with BMI of 50.0-59.9, adult ([place]) 07/18/2013  . History of pulmonary embolism 07/13/2013  . Angioedema of lips 07/13/2013  . Multinodular goiter 07/13/2013  . Dyslipidemia, goal LDL below 70 10/02/2011  . SOB (shortness of breath) 10/02/2011  . BPH with urinary obstruction 07/17/2010  . Diabetes mellitus type 2, controlled ([place]) 07/17/2010  . Bilateral lower extremity edema 12/08/2008  . NEUROPATHY, IDIOPATHIC PERIPHERAL NEC 05/31/2007  . Osteoarthritis 03/22/2007  . Gout 03/15/2007  . Essential hypertension 03/15/2007    Taesha Goodell, PT 10/25/2018, 3:17 PM  [facility] [address] [place], [place], [address] Phone: [phone]   Fax:  [phone]  Name: Franklin Hicks MRN: [id number] Date of Birth: 1951/07/06

## 2018-10-27 ENCOUNTER — Ambulatory Visit: Payer: Medicare Other | Admitting: Rehabilitative and Restorative Service Providers"

## 2018-10-27 DIAGNOSIS — M6281 Muscle weakness (generalized): Secondary | ICD-10-CM | POA: Diagnosis not present

## 2018-10-27 DIAGNOSIS — R2681 Unsteadiness on feet: Secondary | ICD-10-CM

## 2018-10-27 DIAGNOSIS — R2689 Other abnormalities of gait and mobility: Secondary | ICD-10-CM

## 2018-10-28 NOTE — Therapy (Signed)
[facility] [address] [place], [place], [address] Phone: [phone]   Fax:  [phone]  Physical Therapy Treatment  Patient Details  Name: Franklin Hicks MRN: [id number] Date of Birth: 18-Jul-1951 Referring Provider (PT): Faith Rogue, MD   Encounter Date: 10/27/2018  PT End of Session - 10/28/18 0911    Visit Number  29    Number of Visits  44    Date for PT Re-Evaluation  12/24/18    Authorization Type  $20 copay [employer] medicare    PT Start Time  1320    PT Stop Time  1405    PT Time Calculation (min)  45 min    Activity Tolerance  Patient tolerated treatment well    Behavior During Therapy  [facility] for tasks assessed/performed       Past Medical History:  Diagnosis Date  . Arthritis    "knees" (12/17/2017)  . CAD S/P percutaneous coronary angioplasty 09/20/2016   Nstemi 08/2016. Dr. Algie Coffer. DES- brilinta and asa. Requests change to [facility] cardiology; 95% Ramus -> PCI Resolute Onyx DES 2.75 x 18  . Central cord syndrome (H[facility]) 12/17/2017  . Chronic combined systolic and diastolic heart failure (H[facility]) 10/09/2016   EF 45% and grade II diastolic after nstemi  . Diet-controlled diabetes mellitus ([facility])   . DJD (degenerative joint disease)   . Gout   . High cholesterol   . Hypertension   . NSTEMI (non-ST elevated myocardial infarction) ([facility]) 09/20/2016   95% Ramus - > PCI   . Obesity   . Recurrent pulmonary embolism ([facility]) 11/'14; 4/'19   a) Bilateral segmental and subsegmental pulmonary emboli with mild RV Strain.; b) after fall with C-spine Fxr --> Acute PE of right main pulmonary artery extending into multiple segments.    Past Surgical History:  Procedure Laterality Date  . CARDIAC CATHETERIZATION N/A 09/19/2016   Procedure: Left Heart Cath and Coronary Angiography;  Surgeon: Orpah Cobb, MD;  Location: [facility] IN[facility]AS[facility] LAB;  Service: Cardiovascular: 95% proximal Ramus Intermedius --> PCI  . CARDIAC CATHETERIZATION N/A  09/19/2016   Procedure: Coronary Stent Intervention;  Surgeon: Yvonne Kendall, MD;  Location: [facility] INVASIVE CV LAB;  Service: Cardiovascular: 95% ramus intermedius;  Resolute Onyx 2.75 x 18 mm drug-eluting stent  . CYSTOSCOPY/RETROGRADE/URETEROSCOPY/STONE EXTRACTION WITH BASKET  6010,9323   ureteral stone   . MENISCUS REPAIR Left    knee open meniscetomy  . TRANSTHORACIC ECHOCARDIOGRAM  2004   no lvh nl ejection fraction  . TRANSTHORACIC ECHOCARDIOGRAM  09/19/2016   In setting of NSTEMI:  EF 45-50% with diffuse hypokinesis. GR 2 DD. Mild biatrial enlargement.    There were no vitals filed for this visit.  Subjective Assessment - 10/27/18 1348    Subjective  "My legs are still tired from walking Monday."    Pertinent History  history of CAD with chronic combined CHF, T2DM, morbid obesity, hypertension, PE in the past; venous stasis ulcers.    Patient Stated Goals  Returning to greatest level of independence.  (prior status was short distance walking with 2 canes).    Currently in Pain?  Yes    Pain Score  4     Pain Location  Knee    Pain Orientation  Left    Pain Descriptors / Indicators  Aching    Pain Type  Chronic pain    Pain Onset  More than a month ago    Pain Frequency  Intermittent    Aggravating Factors   "  feels weak" in standing    Pain Relieving Factors  rest         [facility] PT Assessment - 10/27/18 1402      Ambulation/Gait   Ambulation/Gait Assistance  6: Modified independent (Device/Increase time)    Ambulation/Gait Assistance Details  Gait in the parallel bars x 10 feet forward, 10 feet backward x 2 reps, 10 feet x 2 encouraging dec'd WB through UEs.  Then ambulated with RW to front office.    Ambulation Distance (Feet)  160 Feet    Assistive device  Rolling walker;Parallel bars    Ambulation Surface  Level;Indoor                   [facility] Adult PT Treatment/Exercise - 10/27/18 1402      Ambulation/Gait   Ambulation/Gait  Yes      Neuro Re-ed     Neuro Re-ed Details   Standing weight shifting dec'ing UE support to unilateral. Standing moving one foot into/out of stride with one UE support in parallel bars.  Standing balance dec'ing UE support with alternating reaching R and L sides.  Moving R and L LE to 4" step with UE support and cues for less trunk flexion/more upright stance.  The patient performed step ups with R leg x 4 reps with bilateral UE support in parallel bars.      Exercises   Exercises  Other Exercises    Other Exercises   Standing heel raises x 10 reps, standing knee flexion attempted encouraging L knee flexion when trying to walk backwards (keeps knee more extended on L).               PT Short Term Goals - 10/25/18 1517      PT SHORT TERM GOAL #1   Title  The patient will be able to walk into physical therapy clinic with RW mod indep (no use of w/c into PT clinic).    Time  4    Period  Weeks    Status  New    Target Date  11/24/18      PT SHORT TERM GOAL #2   Title  The patient will report walking in the home for 90% of daily mobility and only using w/c for 10% or less of the time.    Time  4    Period  Weeks    Status  New    Target Date  11/24/18      PT SHORT TERM GOAL #3   Title  The patient will negotiate 4 steps with bilateral handrails or one handrail and 1 cane with supervision.    Time  4    Period  Weeks    Status  New    Target Date  11/24/18      PT SHORT TERM GOAL #4   Title  The patient will improve gait speed from 1.06 ft/sec to > or equal to 1.31 ft/sec to demo improving community mobility.    Time  4    Period  Weeks    Status  New    Target Date  11/24/18        PT Long Term Goals - 10/25/18 1520      PT LONG TERM GOAL #1   Title  The patient will be indep with progression of HEP for post d/c strengthening and mobility. (LTG due date 12/24/2018)    Time  8    Period  Weeks    Status  Revised    Target Date  12/24/18      PT LONG TERM GOAL #2   Title  The patient  will be able to access the pool and participate in HEP in water for home exercise program.    Time  8    Period  Weeks    Status  New    Target Date  12/24/18      PT LONG TERM GOAL #3   Title  The patient will be able to verbalize ability to walk into restaurant, store to demonstrate improved access to the community.    Time  8    Period  Weeks    Status  New    Target Date  12/24/18            Plan - 10/28/18 5027    Clinical Impression Statement  The patient is progressing with standing tolerance and balance.  PT continuing to encourage further limited community walking.  Plan to continue standing balance, strengthening to improve functional mobility.    PT Treatment/Interventions  ADLs/Self Care Home Management;Balance training;Neuromuscular re-education;Patient/family education;Gait training;Stair training;Functional mobility training;DME Instruction;Therapeutic activities;Therapeutic exercise;Aquatic Therapy    PT Next Visit Plan  Walking into the clinic from parking lot, steps (begin with bottom 1-2 steps), walking more at home, standing balance, upright posture, endurance    Consulted and Agree with Plan of Care  Patient       Patient will benefit from skilled therapeutic intervention in order to improve the following deficits and impairments:  Abnormal gait, Impaired sensation, Obesity, Decreased activity tolerance, Decreased balance, Decreased strength, Pain, Decreased mobility, Difficulty walking, Decreased range of motion, Impaired flexibility, Postural dysfunction  Visit Diagnosis: Muscle weakness (generalized)  Unsteadiness on feet  Other abnormalities of gait and mobility     Problem List Patient Active Problem List   Diagnosis Date Noted  . Recurrent pulmonary embolism (HCC)   . Pain of left heel   . C4 spinal cord injury, sequela (HCC) 12/18/2017  . Tetraplegia (HCC) 12/18/2017  . Aortic aneurysm (HCC) 12/16/2017  . Cord compression (HCC) 12/13/2017   . Chronic combined systolic and diastolic heart failure (HCC) 10/09/2016  . CAD S/P percutaneous coronary angioplasty 09/20/2016  . Erectile dysfunction 07/30/2015  . Morbid obesity with BMI of 50.0-59.9, adult (HCC) 07/18/2013  . History of pulmonary embolism 07/13/2013  . Angioedema of lips 07/13/2013  . Multinodular goiter 07/13/2013  . Dyslipidemia, goal LDL below 70 10/02/2011  . SOB (shortness of breath) 10/02/2011  . BPH with urinary obstruction 07/17/2010  . Diabetes mellitus type 2, controlled (HCC) 07/17/2010  . Bilateral lower extremity edema 12/08/2008  . NEUROPATHY, IDIOPATHIC PERIPHERAL NEC 05/31/2007  . Osteoarthritis 03/22/2007  . Gout 03/15/2007  . Essential hypertension 03/15/2007    Maisen Klingler, PT 10/28/2018, 1:03 PM  [facility] [address] [place], [place], [address] Phone: [phone]   Fax:  [phone]  Name: Franklin Hicks MRN: [id number] Date of Birth: 20-Oct-1950

## 2018-11-01 ENCOUNTER — Ambulatory Visit: Payer: Medicare Other | Admitting: Rehabilitative and Restorative Service Providers"

## 2018-11-01 ENCOUNTER — Encounter: Payer: Self-pay | Admitting: Rehabilitative and Restorative Service Providers"

## 2018-11-01 DIAGNOSIS — M6281 Muscle weakness (generalized): Secondary | ICD-10-CM | POA: Diagnosis not present

## 2018-11-01 DIAGNOSIS — R2681 Unsteadiness on feet: Secondary | ICD-10-CM

## 2018-11-01 DIAGNOSIS — R2689 Other abnormalities of gait and mobility: Secondary | ICD-10-CM

## 2018-11-01 NOTE — Therapy (Signed)
[facility] [address] [place], [place], [address] Phone: [phone]   Fax:  [phone]  Physical Therapy Treatment and 10th visit progress note  Patient Details  Name: Franklin Hicks MRN: [id number] Date of Birth: 05-Nov-1950 Referring Provider (PT): Faith Rogue, MD   Encounter Date: 11/01/2018  PT End of Session - 11/01/18 1108    Visit Number  30    Number of Visits  44    Date for PT Re-Evaluation  12/24/18    Authorization Type  $20 copay [employer] medicare    PT Start Time  1100    PT Stop Time  1145    PT Time Calculation (min)  45 min    Activity Tolerance  Patient tolerated treatment well    Behavior During Therapy  [facility] for tasks assessed/performed       Past Medical History:  Diagnosis Date  . Arthritis    "knees" (12/17/2017)  . CAD S/P percutaneous coronary angioplasty 09/20/2016   Nstemi 08/2016. Dr. Algie Coffer. DES- brilinta and asa. Requests change to [facility] cardiology; 95% Ramus -> PCI Resolute Onyx DES 2.75 x 18  . Central cord syndrome (HCC) 12/17/2017  . Chronic combined systolic and diastolic heart failure (HCC) 10/09/2016   EF 45% and grade II diastolic after nstemi  . Diet-controlled diabetes mellitus (HCC)   . DJD (degenerative joint disease)   . Gout   . High cholesterol   . Hypertension   . NSTEMI (non-ST elevated myocardial infarction) (HCC) 09/20/2016   95% Ramus - > PCI   . Obesity   . Recurrent pulmonary embolism (HCC) 11/'14; 4/'19   a) Bilateral segmental and subsegmental pulmonary emboli with mild RV Strain.; b) after fall with C-spine Fxr --> Acute PE of right main pulmonary artery extending into multiple segments.    Past Surgical History:  Procedure Laterality Date  . CARDIAC CATHETERIZATION N/A 09/19/2016   Procedure: Left Heart Cath and Coronary Angiography;  Surgeon: Orpah Cobb, MD;  Location: [facility] INVASIVE CV LAB;  Service: Cardiovascular: 95% proximal Ramus Intermedius --> PCI  .  CARDIAC CATHETERIZATION N/A 09/19/2016   Procedure: Coronary Stent Intervention;  Surgeon: Yvonne Kendall, MD;  Location: [facility] INVASIVE CV LAB;  Service: Cardiovascular: 95% ramus intermedius;  Resolute Onyx 2.75 x 18 mm drug-eluting stent  . CYSTOSCOPY/RETROGRADE/URETEROSCOPY/STONE EXTRACTION WITH BASKET  9563,8756   ureteral stone   . MENISCUS REPAIR Left    knee open meniscetomy  . TRANSTHORACIC ECHOCARDIOGRAM  2004   no lvh nl ejection fraction  . TRANSTHORACIC ECHOCARDIOGRAM  09/19/2016   In setting of NSTEMI:  EF 45-50% with diffuse hypokinesis. GR 2 DD. Mild biatrial enlargement.    There were no vitals filed for this visit.  Subjective Assessment - 11/01/18 1107    Subjective  The patient notes it is his anniversary today.    PT brought walker into lobby and had patient walk into clinic.    Pertinent History  history of CAD with chronic combined CHF, T2DM, morbid obesity, hypertension, PE in the past; venous stasis ulcers.    Patient Stated Goals  Returning to greatest level of independence.  (prior status was short distance walking with 2 canes).    Currently in Pain?  Yes    Pain Score  3     Pain Location  Knee    Pain Orientation  Left;Right   L>R   Pain Descriptors / Indicators  Aching    Pain Onset  More than a month ago  Pain Frequency  Intermittent    Aggravating Factors   sore    Pain Relieving Factors  rest                       [facility] Adult PT Treatment/Exercise - 11/01/18 1118      Ambulation/Gait   Ambulation/Gait  Yes    Ambulation/Gait Assistance  6: Modified independent (Device/Increase time)    Ambulation/Gait Assistance Details  Gait emphasizing upright posture, longer stride and dec'd weight bearing through UEs and walker.    Ambulation Distance (Feet)  120 Feet   150, 120, 50 x 2   Assistive device  Rolling walker    Ambulation Surface  Level;Indoor      Neuro Re-ed    Neuro Re-ed Details   Wall bumps working on anterior/posterior  weight shifting.      Exercises   Exercises  Other Exercises    Other Exercises   Standing hip flexion (first part of dead lift movement) emphasizing hinging at the hip joint and returning to upright position dec'ing reliance on UEs.  Standing counter top push ups, counter top hip extension leaning into a modified up dog through countertop position.  Standing reaching R and L UEs to top cabinets to emphasize postural lengthening/strengthening.  SUPINE:  bridges x 10 reps, PROM with overpressure of the left knee.  Sit<>stand dec'ing support through UEs x 5 reps with close SBA (patient prefers no gait belt or hands on assist).                 PT Short Term Goals - 10/25/18 1517      PT SHORT TERM GOAL #1   Title  The patient will be able to walk into physical therapy clinic with RW mod indep (no use of w/c into PT clinic).    Time  4    Period  Weeks    Status  New    Target Date  11/24/18      PT SHORT TERM GOAL #2   Title  The patient will report walking in the home for 90% of daily mobility and only using w/c for 10% or less of the time.    Time  4    Period  Weeks    Status  New    Target Date  11/24/18      PT SHORT TERM GOAL #3   Title  The patient will negotiate 4 steps with bilateral handrails or one handrail and 1 cane with supervision.    Time  4    Period  Weeks    Status  New    Target Date  11/24/18      PT SHORT TERM GOAL #4   Title  The patient will improve gait speed from 1.06 ft/sec to > or equal to 1.31 ft/sec to demo improving community mobility.    Time  4    Period  Weeks    Status  New    Target Date  11/24/18        PT Long Term Goals - 10/25/18 1520      PT LONG TERM GOAL #1   Title  The patient will be indep with progression of HEP for post d/c strengthening and mobility. (LTG due date 12/24/2018)    Time  8    Period  Weeks    Status  Revised    Target Date  12/24/18      PT LONG TERM GOAL #  2   Title  The patient will be able to access  the pool and participate in HEP in water for home exercise program.    Time  8    Period  Weeks    Status  New    Target Date  12/24/18      PT LONG TERM GOAL #3   Title  The patient will be able to verbalize ability to walk into restaurant, store to demonstrate improved access to the community.    Time  8    Period  Weeks    Status  New    Target Date  12/24/18            Plan - 11/01/18 2038    Clinical Impression Statement  The patient did not walk into the clinic today, therefore, PT had him walk from lobby into clinic and at end of the session, he walked outdoors from the back of the clinic with PT pushing his wheelchair.  The patient tolerated increased standing today and PT is having patient perform walking only when in the clinic.  This is his 30th visit and 10th progress note will be updated.     PT Treatment/Interventions  ADLs/Self Care Home Management;Balance training;Neuromuscular re-education;Patient/family education;Gait training;Stair training;Functional mobility training;DME Instruction;Therapeutic activities;Therapeutic exercise;Aquatic Therapy    PT Next Visit Plan  Walking into the clinic from parking lot, steps (begin with bottom 1-2 steps), walking more at home, standing balance, upright posture, endurance    Consulted and Agree with Plan of Care  Patient       Patient will benefit from skilled therapeutic intervention in order to improve the following deficits and impairments:  Abnormal gait, Impaired sensation, Obesity, Decreased activity tolerance, Decreased balance, Decreased strength, Pain, Decreased mobility, Difficulty walking, Decreased range of motion, Impaired flexibility, Postural dysfunction  Visit Diagnosis: Muscle weakness (generalized)  Unsteadiness on feet  Other abnormalities of gait and mobility  Physical Therapy Progress Note   Dates of Reporting Period:09/01/2018 - 11/01/2018   Objective Measurements: able to walk 500 ft with RW and mod  indep in the clinic.  Goal Update: see above   Plan: see above    Reason Skilled Services are Required: Patient is continuing to progress household and limited community ambulation.  PT to continue working on standing tolerance, endurance, strength, balance, and gait.  Thank you for the referral of this patient. Margretta Dittyhristina Meriel Kelliher, MPT    Candence Sease 11/01/2018, 8:44 PM  [facility] [address] [place], [place], [address] Phone: [phone]   Fax:  [phone]  Name: Franklin Hicks MRN: [id number] Date of Birth: 08/07/1951

## 2018-11-03 ENCOUNTER — Ambulatory Visit: Payer: Medicare Other | Admitting: Rehabilitative and Restorative Service Providers"

## 2018-11-03 ENCOUNTER — Encounter: Payer: Self-pay | Admitting: Rehabilitative and Restorative Service Providers"

## 2018-11-03 DIAGNOSIS — M6281 Muscle weakness (generalized): Secondary | ICD-10-CM

## 2018-11-03 DIAGNOSIS — R2681 Unsteadiness on feet: Secondary | ICD-10-CM

## 2018-11-03 DIAGNOSIS — R2689 Other abnormalities of gait and mobility: Secondary | ICD-10-CM

## 2018-11-03 NOTE — Therapy (Signed)
[facility] [address] [place], [place], [address] Phone: [phone]   Fax:  [phone]  Physical Therapy Treatment  Patient Details  Name: Franklin Hicks MRN: [id number] Date of Birth: [birth year] Referring Provider (PT): Faith Rogue, MD   Encounter Date: 11/03/2018  PT End of Session - 11/03/18 1504    Visit Number  31    Number of Visits  44    Date for PT Re-Evaluation  12/24/18    Authorization Type  $20 copay [employer] medicare    PT Start Time  1235    PT Stop Time  1320    PT Time Calculation (min)  45 min    Activity Tolerance  Patient tolerated treatment well    Behavior During Therapy  [facility] for tasks assessed/performed       Past Medical History:  Diagnosis Date  . Arthritis    "knees" (12/17/2017)  . CAD S/P percutaneous coronary angioplasty 09/20/2016   Nstemi 08/2016. Dr. Algie Coffer. DES- brilinta and asa. Requests change to [facility] cardiology; 95% Ramus -> PCI Resolute Onyx DES 2.75 x 18  . Central cord syndrome (HCC) 12/17/2017  . Chronic combined systolic and diastolic heart failure (HCC) 10/09/2016   EF 45% and grade II diastolic after nstemi  . Diet-controlled diabetes mellitus (HCC)   . DJD (degenerative joint disease)   . Gout   . High cholesterol   . Hypertension   . NSTEMI (non-ST elevated myocardial infarction) (HCC) 09/20/2016   95% Ramus - > PCI   . Obesity   . Recurrent pulmonary embolism (HCC) 11/'14; 4/'19   a) Bilateral segmental and subsegmental pulmonary emboli with mild RV Strain.; b) after fall with C-spine Fxr --> Acute PE of right main pulmonary artery extending into multiple segments.    Past Surgical History:  Procedure Laterality Date  . CARDIAC CATHETERIZATION N/A 09/19/2016   Procedure: Left Heart Cath and Coronary Angiography;  Surgeon: Orpah Cobb, MD;  Location: [facility] INVASIVE CV LAB;  Service: Cardiovascular: 95% proximal Ramus Intermedius --> PCI  . CARDIAC CATHETERIZATION N/A  09/19/2016   Procedure: Coronary Stent Intervention;  Surgeon: Yvonne Kendall, MD;  Location: [facility] INVASIVE CV LAB;  Service: Cardiovascular: 95% ramus intermedius;  Resolute Onyx 2.75 x 18 mm drug-eluting stent  . CYSTOSCOPY/RETROGRADE/URETEROSCOPY/STONE EXTRACTION WITH BASKET  6606,3016   ureteral stone   . MENISCUS REPAIR Left    knee open meniscetomy  . TRANSTHORACIC ECHOCARDIOGRAM  2004   no lvh nl ejection fraction  . TRANSTHORACIC ECHOCARDIOGRAM  09/19/2016   In setting of NSTEMI:  EF 45-50% with diffuse hypokinesis. GR 2 DD. Mild biatrial enlargement.    There were no vitals filed for this visit.  Subjective Assessment - 11/03/18 1243    Subjective  The patient notes he was tired after Monday and his legs were achy.      Pertinent History  history of CAD with chronic combined CHF, T2DM, morbid obesity, hypertension, PE in the past; venous stasis ulcers.    Patient Stated Goals  Returning to greatest level of independence.  (prior status was short distance walking with 2 canes).    Currently in Pain?  Yes    Pain Score  4     Pain Location  Knee    Pain Orientation  Left    Pain Descriptors / Indicators  Aching    Pain Type  Chronic pain    Pain Onset  More than a month ago    Pain Frequency  Intermittent                       [facility] Adult PT Treatment/Exercise - 11/03/18 1505      Ambulation/Gait   Ambulation/Gait  Yes    Ambulation/Gait Assistance  6: Modified independent (Device/Increase time)    Ambulation/Gait Assistance Details  Gait walking into clinic from lobby x 90 ft, walking 20 feet from mat<>stairs, 20 feet mat>countertop, 20 feet walking countertop>UBE.  Then ambulated to front desk.  *PT emphasizing increased walking throughout session without using w/c in clinic.    Assistive device  Rolling walker    Ambulation Surface  Level;Indoor    Stairs  Yes    Stairs Assistance  4: Min guard    Stairs Assistance Details (indicate cue type and reason)   Patient takes increased time to work through hand placement on the railing and weight shift in order to move the right LE.    Stair Management Technique  Two rails;Step to pattern    Number of Stairs  1   repeated the bottom step x 3 reps     Neuro Re-ed    Neuro Re-ed Details   Wall bumps emphasizing ant/posterior weight shifting.       Exercises   Exercises  Other Exercises    Other Exercises   Standing UE reaching R And L sides x 10 reps working on postural elongation.  Performed standing UBE x 1 minute forward, 1 minute backwards.                PT Short Term Goals - 10/25/18 1517      PT SHORT TERM GOAL #1   Title  The patient will be able to walk into physical therapy clinic with RW mod indep (no use of w/c into PT clinic).    Time  4    Period  Weeks    Status  New    Target Date  11/24/18      PT SHORT TERM GOAL #2   Title  The patient will report walking in the home for 90% of daily mobility and only using w/c for 10% or less of the time.    Time  4    Period  Weeks    Status  New    Target Date  11/24/18      PT SHORT TERM GOAL #3   Title  The patient will negotiate 4 steps with bilateral handrails or one handrail and 1 cane with supervision.    Time  4    Period  Weeks    Status  New    Target Date  11/24/18      PT SHORT TERM GOAL #4   Title  The patient will improve gait speed from 1.06 ft/sec to > or equal to 1.31 ft/sec to demo improving community mobility.    Time  4    Period  Weeks    Status  New    Target Date  11/24/18        PT Long Term Goals - 10/25/18 1520      PT LONG TERM GOAL #1   Title  The patient will be indep with progression of HEP for post d/c strengthening and mobility. (LTG due date 12/24/2018)    Time  8    Period  Weeks    Status  Revised    Target Date  12/24/18      PT LONG TERM GOAL #2  Title  The patient will be able to access the pool and participate in HEP in water for home exercise program.    Time  8     Period  Weeks    Status  New    Target Date  12/24/18      PT LONG TERM GOAL #3   Title  The patient will be able to verbalize ability to walk into restaurant, store to demonstrate improved access to the community.    Time  8    Period  Weeks    Status  New    Target Date  12/24/18            Plan - 11/03/18 1510    Clinical Impression Statement  PT is continuing to emphasize more functional walking and standing activities in order to improve carryover to the home.      PT Treatment/Interventions  ADLs/Self Care Home Management;Balance training;Neuromuscular re-education;Patient/family education;Gait training;Stair training;Functional mobility training;DME Instruction;Therapeutic activities;Therapeutic exercise;Aquatic Therapy    PT Next Visit Plan  Walking into the clinic from parking lot, steps (begin with bottom 1-2 steps), walking more at home, standing balance, upright posture, endurance    Consulted and Agree with Plan of Care  Patient       Patient will benefit from skilled therapeutic intervention in order to improve the following deficits and impairments:  Abnormal gait, Impaired sensation, Obesity, Decreased activity tolerance, Decreased balance, Decreased strength, Pain, Decreased mobility, Difficulty walking, Decreased range of motion, Impaired flexibility, Postural dysfunction  Visit Diagnosis: Muscle weakness (generalized)  Unsteadiness on feet  Other abnormalities of gait and mobility     Problem List Patient Active Problem List   Diagnosis Date Noted  . Recurrent pulmonary embolism (HCC)   . Pain of left heel   . C4 spinal cord injury, sequela (HCC) 12/18/2017  . Tetraplegia (HCC) 12/18/2017  . Aortic aneurysm (HCC) 12/16/2017  . Cord compression (HCC) 12/13/2017  . Chronic combined systolic and diastolic heart failure (HCC) 10/09/2016  . CAD S/P percutaneous coronary angioplasty 09/20/2016  . Erectile dysfunction 07/30/2015  . Morbid obesity with BMI  of 50.0-59.9, adult (HCC) 07/18/2013  . History of pulmonary embolism 07/13/2013  . Angioedema of lips 07/13/2013  . Multinodular goiter 07/13/2013  . Dyslipidemia, goal LDL below 70 10/02/2011  . SOB (shortness of breath) 10/02/2011  . BPH with urinary obstruction 07/17/2010  . Diabetes mellitus type 2, controlled (HCC) 07/17/2010  . Bilateral lower extremity edema 12/08/2008  . NEUROPATHY, IDIOPATHIC PERIPHERAL NEC 05/31/2007  . Osteoarthritis 03/22/2007  . Gout 03/15/2007  . Essential hypertension 03/15/2007    Tou Hayner, PT 11/03/2018, 3:10 PM  [facility] [address] [place], [place], [address] Phone: [phone]   Fax:  [phone]  Name: NAJEE NEUGEBAUER MRN: [id number] Date of Birth: 09-Oct-1950

## 2018-11-08 ENCOUNTER — Ambulatory Visit: Payer: Medicare Other | Admitting: Physical Therapy

## 2018-11-08 ENCOUNTER — Encounter: Payer: Self-pay | Admitting: Physical Therapy

## 2018-11-08 DIAGNOSIS — R2689 Other abnormalities of gait and mobility: Secondary | ICD-10-CM

## 2018-11-08 DIAGNOSIS — M6281 Muscle weakness (generalized): Secondary | ICD-10-CM | POA: Diagnosis not present

## 2018-11-08 DIAGNOSIS — R2681 Unsteadiness on feet: Secondary | ICD-10-CM

## 2018-11-08 NOTE — Therapy (Signed)
[facility] [address] [place], [place], [address] Phone: [phone]   Fax:  [phone]  Physical Therapy Treatment  Patient Details  Name: Franklin Hicks MRN: [id number] Date of Birth: [birth year] Referring Provider (PT): Faith Rogue, MD   Encounter Date: 11/08/2018  PT End of Session - 11/08/18 1317    Visit Number  32    Number of Visits  44    Date for PT Re-Evaluation  12/24/18    Authorization Type  $20 copay [employer] medicare    PT Start Time  1022    PT Stop Time  1103    PT Time Calculation (min)  41 min    Activity Tolerance  Patient tolerated treatment well    Behavior During Therapy  [facility] for tasks assessed/performed       Past Medical History:  Diagnosis Date  . Arthritis    "knees" (12/17/2017)  . CAD S/P percutaneous coronary angioplasty 09/20/2016   Nstemi 08/2016. Dr. Algie Coffer. DES- brilinta and asa. Requests change to [facility] cardiology; 95% Ramus -> PCI Resolute Onyx DES 2.75 x 18  . Central cord syndrome (HCC) 12/17/2017  . Chronic combined systolic and diastolic heart failure (HCC) 10/09/2016   EF 45% and grade II diastolic after nstemi  . Diet-controlled diabetes mellitus (HCC)   . DJD (degenerative joint disease)   . Gout   . High cholesterol   . Hypertension   . NSTEMI (non-ST elevated myocardial infarction) (HCC) 09/20/2016   95% Ramus - > PCI   . Obesity   . Recurrent pulmonary embolism (HCC) 11/'14; 4/'19   a) Bilateral segmental and subsegmental pulmonary emboli with mild RV Strain.; b) after fall with C-spine Fxr --> Acute PE of right main pulmonary artery extending into multiple segments.    Past Surgical History:  Procedure Laterality Date  . CARDIAC CATHETERIZATION N/A 09/19/2016   Procedure: Left Heart Cath and Coronary Angiography;  Surgeon: Orpah Cobb, MD;  [facility] INVAS[facility] CV LAB;  Service: Cardiovascular: 95% proximal Ramus Intermedius --> PCI  . CARDIAC CATHETERIZATION N/A  09/19/2016   Procedure: Coronary Stent Intervention;  Surgeon: Yvonne Kendall, MD;  Location: [facility] INVASIVE CV LAB;  Service: Cardiovascular: 95% ramus intermedius;  Resolute Onyx 2.75 x 18 mm drug-eluting stent  . CYSTOSCOPY/RETROGRADE/URETEROSCOPY/STONE EXTRACTION WITH BASKET  0109,3235   ureteral stone   . MENISCUS REPAIR Left    knee open meniscetomy  . TRANSTHORACIC ECHOCARDIOGRAM  2004   no lvh nl ejection fraction  . TRANSTHORACIC ECHOCARDIOGRAM  09/19/2016   In setting of NSTEMI:  EF 45-50% with diffuse hypokinesis. GR 2 DD. Mild biatrial enlargement.    There were no vitals filed for this visit.  Subjective Assessment - 11/08/18 1025    Subjective  Nothing new to report.    Pertinent History  history of CAD with chronic combined CHF, T2DM, morbid obesity, hypertension, PE in the past; venous stasis ulcers.    Patient Stated Goals  Returning to greatest level of independence.  (prior status was short distance walking with 2 canes).    Currently in Pain?  Yes    Pain Score  4     Pain Location  Knee    Pain Orientation  Left    Pain Descriptors / Indicators  Aching    Pain Type  Chronic pain    Pain Onset  More than a month ago    Pain Frequency  Intermittent  [facility] Adult PT Treatment/Exercise - 11/08/18 0001      Ambulation/Gait   Ambulation/Gait  Yes    Ambulation/Gait Assistance  6: Modified independent (Device/Increase time)    Ambulation/Gait Assistance Details  pt had to "warm up" L knee was stiff, cues for heel toe gait pattern    Ambulation Distance (Feet)  120 Feet   +100 +20 x3   Assistive device  Rolling walker    Gait Pattern  Decreased stance time - left;Wide base of support;Trunk flexed    Ambulation Surface  Indoor;Level    Stairs  Yes    Stairs Assistance  5: Supervision    Stairs Assistance Details (indicate cue type and reason)  Patient takes increased time to work through hand placement on the railing and weight  shift in order to move the right LE.    Stair Management Technique  Two rails;Step to pattern    Number of Stairs  2   x2   Height of Stairs  6    Ramp  5: Supervision    Ramp Details (indicate cue type and reason)  with RW, cues for technique, x2 without rest break.          Balance Exercises - 11/08/18 1058      Balance Exercises: Standing   Wall Bumps  Hip;Shoulder   cues for holding upright posture without UE support, 2x5         PT Short Term Goals - 10/25/18 1517      PT SHORT TERM GOAL #1   Title  The patient will be able to walk into physical therapy clinic with RW mod indep (no use of w/c into PT clinic).    Time  4    Period  Weeks    Status  New    Target Date  11/24/18      PT SHORT TERM GOAL #2   Title  The patient will report walking in the home for 90% of daily mobility and only using w/c for 10% or less of the time.    Time  4    Period  Weeks    Status  New    Target Date  11/24/18      PT SHORT TERM GOAL #3   Title  The patient will negotiate 4 steps with bilateral handrails or one handrail and 1 cane with supervision.    Time  4    Period  Weeks    Status  New    Target Date  11/24/18      PT SHORT TERM GOAL #4   Title  The patient will improve gait speed from 1.06 ft/sec to > or equal to 1.31 ft/sec to demo improving community mobility.    Time  4    Period  Weeks    Status  New    Target Date  11/24/18        PT Long Term Goals - 10/25/18 1520      PT LONG TERM GOAL #1   Title  The patient will be indep with progression of HEP for post d/c strengthening and mobility. (LTG due date 12/24/2018)    Time  8    Period  Weeks    Status  Revised    Target Date  12/24/18      PT LONG TERM GOAL #2   Title  The patient will be able to access the pool and participate in HEP in water for home exercise program.  Time  8    Period  Weeks    Status  New    Target Date  12/24/18      PT LONG TERM GOAL #3   Title  The patient will be able  to verbalize ability to walk into restaurant, store to demonstrate improved access to the community.    Time  8    Period  Weeks    Status  New    Target Date  12/24/18            Plan - 11/08/18 1318    Clinical Impression Statement  Skilled session focused on gait negotiating community barriers, functional short walks, and standing balance activties.  Pt performed all gait activities at supervision level negotiating ramp, steps, and functional turns.  Standing balance training: working on A/P weight shifts and maintaining upright posture without UE support; pt able to perform but limited reporting weakness in LEs.                                                                                                                                                               PT Treatment/Interventions  ADLs/Self Care Home Management;Balance training;Neuromuscular re-education;Patient/family education;Gait training;Stair training;Functional mobility training;DME Instruction;Therapeutic activities;Therapeutic exercise;Aquatic Therapy    PT Next Visit Plan  Walking into the clinic from parking lot, steps (begin with bottom 1-2 steps), walking more at home, standing balance, upright posture, endurance    Consulted and Agree with Plan of Care  Patient       Patient will benefit from skilled therapeutic intervention in order to improve the following deficits and impairments:  Abnormal gait, Impaired sensation, Obesity, Decreased activity tolerance, Decreased balance, Decreased strength, Pain, Decreased mobility, Difficulty walking, Decreased range of motion, Impaired flexibility, Postural dysfunction  Visit Diagnosis: Muscle weakness (generalized)  Unsteadiness on feet  Other abnormalities of gait and mobility     Problem List Patient Active Problem List   Diagnosis Date Noted  . Recurrent pulmonary embolism (HCC)   . Pain of left heel   . C4 spinal cord injury, sequela (HCC) 12/18/2017   . Tetraplegia (HCC) 12/18/2017  . Aortic aneurysm (HCC) 12/16/2017  . Cord compression (HCC) 12/13/2017  . Chronic combined systolic and diastolic heart failure (HCC) 10/09/2016  . CAD S/P percutaneous coronary angioplasty 09/20/2016  . Erectile dysfunction 07/30/2015  . Morbid obesity with BMI of 50.0-59.9, adult (HCC) 07/18/2013  . History of pulmonary embolism 07/13/2013  . Angioedema of lips 07/13/2013  . Multinodular goiter 07/13/2013  . Dyslipidemia, goal LDL below 70 10/02/2011  . SOB (shortness of breath) 10/02/2011  . BPH with urinary obstruction 07/17/2010  . Diabetes mellitus type 2, controlled (HCC) 07/17/2010  . Bilateral lower extremity edema 12/08/2008  . NEUROPATHY, IDIOPATHIC PERIPHERAL NEC 05/31/2007  . Osteoarthritis  03/22/2007  . Gout 03/15/2007  . Essential hypertension 03/15/2007    Hortencia Conradi, PTA  11/08/18, 1:27 PM [facility] [address] [place], [place], [address] Phone: [phone]   Fax:  [phone]  Name: KHALLID PASILLAS MRN: [id number] Date of Birth: 1950/11/11

## 2018-11-10 ENCOUNTER — Ambulatory Visit: Payer: Medicare Other | Admitting: Physical Therapy

## 2018-11-10 ENCOUNTER — Encounter: Payer: Self-pay | Admitting: Physical Therapy

## 2018-11-10 DIAGNOSIS — M6281 Muscle weakness (generalized): Secondary | ICD-10-CM

## 2018-11-10 DIAGNOSIS — R2681 Unsteadiness on feet: Secondary | ICD-10-CM

## 2018-11-10 DIAGNOSIS — R2689 Other abnormalities of gait and mobility: Secondary | ICD-10-CM

## 2018-11-10 NOTE — Therapy (Signed)
[facility] [address] [place], [place], [address] Phone: [phone]   Fax:  [phone]  Physical Therapy Treatment  Patient Details  Name: Franklin Hicks MRN: [id number] Date of Birth: 1950-11-07 Referring Provider (PT): Faith Rogue, MD   Encounter Date: 11/10/2018  PT End of Session - 11/10/18 1150    Visit Number  33    Number of Visits  44    Date for PT Re-Evaluation  12/24/18    Authorization Type  $20 copay [employer] medicare    PT Start Time  1103    PT Stop Time  1147    PT Time Calculation (min)  44 min    Activity Tolerance  Patient tolerated treatment well    Behavior During Therapy  [facility] for tasks assessed/performed       Past Medical History:  Diagnosis Date  . Arthritis    "knees" (12/17/2017)  . CAD S/P percutaneous coronary angioplasty 09/20/2016   Nstemi 08/2016. Dr. Algie Coffer. DES- brilinta and asa. Requests change to [facility] cardiology; 95% Ramus -> PCI Resolute Onyx DES 2.75 x 18  . Central cord syndrome (HCC) 12/17/2017  . Chronic combined systolic and diastolic heart failure (HCC) 10/09/2016   EF 45% and grade II diastolic after nstemi  . Diet-controlled diabetes mellitus (HCC)   . DJD (degenerative joint disease)   . Gout   . High cholesterol   . Hypertension   . NSTEMI (non-ST elevated myocardial infarction) (HCC) 09/20/2016   95% Ramus - > PCI   . Obesity   . Recurrent pulmonary embolism (HCC) 11/'14; 4/'19   a) Bilateral segmental and subsegmental pulmonary emboli with mild RV Strain.; b) after fall with C-spine Fxr --> Acute PE of right main pulmonary artery extending into multiple segments.    Past Surgical History:  Procedure Laterality Date  . CARDIAC CATHETERIZATION N/A 09/19/2016   Procedure: Left Heart Cath and Coronary Angiography;  Surgeon: Orpah Cobb, MD;  Location: [facility] INVASIVE CV LAB;  Service: Cardiovascular: 95% proximal Ramus Intermedius --> PCI  . CARDIAC CATHETERIZATION N/A  09/19/2016   Procedure: Coronary Stent Intervention;  Surgeon: Yvonne Kendall, MD;  Location: [facility] INVASIVE CV LAB;  Service: Cardiovascular: 95% ramus intermedius;  Resolute Onyx 2.75 x 18 mm drug-eluting stent  . CYSTOSCOPY/RETROGRADE/URETEROSCOPY/STONE EXTRACTION WITH BASKET  9528,4132   ureteral stone   . MENISCUS REPAIR Left    knee open meniscetomy  . TRANSTHORACIC ECHOCARDIOGRAM  2004   no lvh nl ejection fraction  . TRANSTHORACIC ECHOCARDIOGRAM  09/19/2016   In setting of NSTEMI:  EF 45-50% with diffuse hypokinesis. GR 2 DD. Mild biatrial enlargement.    There were no vitals filed for this visit.  Subjective Assessment - 11/10/18 1108    Subjective  Nothing new to report.    Pertinent History  history of CAD with chronic combined CHF, T2DM, morbid obesity, hypertension, PE in the past; venous stasis ulcers.    Patient Stated Goals  Returning to greatest level of independence.  (prior status was short distance walking with 2 canes).    Currently in Pain?  No/denies    Pain Score  4     Pain Location  Knee    Pain Orientation  Left    Pain Onset  More than a month ago    Pain Frequency  Intermittent                       [facility] Adult PT Treatment/Exercise -  11/10/18 0001      Ambulation/Gait   Ambulation/Gait  Yes    Ambulation/Gait Assistance  6: Modified independent (Device/Increase time)    Ambulation/Gait Assistance Details  Pt increased speed, steplength, and foot clearance after first 1/2 lap.    Ambulation Distance (Feet)  250 Feet   + 100   Assistive device  Rolling walker    Gait Pattern  Decreased stance time - left;Wide base of support;Trunk flexed    Ambulation Surface  Level;Indoor          Balance Exercises - 11/10/18 1127      Balance Exercises: Standing   Standing Eyes Opened  Wide (BOA)   lateral weight shifting working on upright posture with decreased UE support   Stepping Strategy  Lateral;UE support   alternate step and shifts.          PT Short Term Goals - 10/25/18 1517      PT SHORT TERM GOAL #1   Title  The patient will be able to walk into physical therapy clinic with RW mod indep (no use of w/c into PT clinic).    Time  4    Period  Weeks    Status  New    Target Date  11/24/18      PT SHORT TERM GOAL #2   Title  The patient will report walking in the home for 90% of daily mobility and only using w/c for 10% or less of the time.    Time  4    Period  Weeks    Status  New    Target Date  11/24/18      PT SHORT TERM GOAL #3   Title  The patient will negotiate 4 steps with bilateral handrails or one handrail and 1 cane with supervision.    Time  4    Period  Weeks    Status  New    Target Date  11/24/18      PT SHORT TERM GOAL #4   Title  The patient will improve gait speed from 1.06 ft/sec to > or equal to 1.31 ft/sec to demo improving community mobility.    Time  4    Period  Weeks    Status  New    Target Date  11/24/18        PT Long Term Goals - 10/25/18 1520      PT LONG TERM GOAL #1   Title  The patient will be indep with progression of HEP for post d/c strengthening and mobility. (LTG due date 12/24/2018)    Time  8    Period  Weeks    Status  Revised    Target Date  12/24/18      PT LONG TERM GOAL #2   Title  The patient will be able to access the pool and participate in HEP in water for home exercise program.    Time  8    Period  Weeks    Status  New    Target Date  12/24/18      PT LONG TERM GOAL #3   Title  The patient will be able to verbalize ability to walk into restaurant, store to demonstrate improved access to the community.    Time  8    Period  Weeks    Status  New    Target Date  12/24/18            Plan - 11/10/18 1247  Clinical Impression Statement  Skilled session focused on gait for endurance and to increase speed and standing endurance/ strengthening.  Pt demonstrates ability to increase gait speed, step length and foot clearance after warm  up about 13ft.   Pt continues to fatigue quickly and require heavy UE support with standing activities.                                                                                                                         PT Treatment/Interventions  ADLs/Self Care Home Management;Balance training;Neuromuscular re-education;Patient/family education;Gait training;Stair training;Functional mobility training;DME Instruction;Therapeutic activities;Therapeutic exercise;Aquatic Therapy    PT Next Visit Plan  Walking into the clinic from parking lot, steps (begin with bottom 1-2 steps), walking more at home, standing balance, upright posture, endurance    Consulted and Agree with Plan of Care  Patient       Patient will benefit from skilled therapeutic intervention in order to improve the following deficits and impairments:  Abnormal gait, Impaired sensation, Obesity, Decreased activity tolerance, Decreased balance, Decreased strength, Pain, Decreased mobility, Difficulty walking, Decreased range of motion, Impaired flexibility, Postural dysfunction  Visit Diagnosis: Muscle weakness (generalized)  Unsteadiness on feet  Other abnormalities of gait and mobility     Problem List Patient Active Problem List   Diagnosis Date Noted  . Recurrent pulmonary embolism (HCC)   . Pain of left heel   . C4 spinal cord injury, sequela (HCC) 12/18/2017  . Tetraplegia (HCC) 12/18/2017  . Aortic aneurysm (HCC) 12/16/2017  . Cord compression (HCC) 12/13/2017  . Chronic combined systolic and diastolic heart failure (HCC) 10/09/2016  . CAD S/P percutaneous coronary angioplasty 09/20/2016  . Erectile dysfunction 07/30/2015  . Morbid obesity with BMI of 50.0-59.9, adult (HCC) 07/18/2013  . History of pulmonary embolism 07/13/2013  . Angioedema of lips 07/13/2013  . Multinodular goiter 07/13/2013  . Dyslipidemia, goal LDL below 70 10/02/2011  . SOB (shortness of breath) 10/02/2011  . BPH with urinary  obstruction 07/17/2010  . Diabetes mellitus type 2, controlled (HCC) 07/17/2010  . Bilateral lower extremity edema 12/08/2008  . NEUROPATHY, IDIOPATHIC PERIPHERAL NEC 05/31/2007  . Osteoarthritis 03/22/2007  . Gout 03/15/2007  . Essential hypertension 03/15/2007    Hortencia Conradi, PTA  11/10/18, 12:55 PM [facility] [address] [place], [place], [address] Phone: [phone]   Fax:  [phone]  Name: Franklin Hicks MRN: [id number] Date of Birth: October 05, 1950

## 2018-11-15 ENCOUNTER — Ambulatory Visit: Payer: Medicare Other | Admitting: Rehabilitative and Restorative Service Providers"

## 2018-11-16 ENCOUNTER — Telehealth: Payer: Self-pay | Admitting: Rehabilitative and Restorative Service Providers"

## 2018-11-16 NOTE — Telephone Encounter (Signed)
Kristine Tignor was contacted today regarding the temporary closing of [facility] due to Covid-19.  Therapist discussed:  Continuing current home exercise program, recommended standing at the sink with UE reaching, and recommended frequent walking in the home.  Patient has HEP and therefore did not inquire at this time about about e-visit, virtual check in, or telehealth visit, if those services become available.    [facility] will follow up with patients when we are able to resume care.  Jayleena Stille, PT  [facility] [address] [place], [place]  [address] Phone:  [phone] Fax:  [phone]

## 2018-11-17 ENCOUNTER — Telehealth: Payer: Self-pay | Admitting: Family Medicine

## 2018-11-17 ENCOUNTER — Ambulatory Visit: Payer: Medicare Other | Admitting: Rehabilitative and Restorative Service Providers"

## 2018-11-17 NOTE — Telephone Encounter (Signed)
Copied from CRM [id number]. Topic: Quick Communication - See Telephone Encounter >> Nov 17, 2018  3:31 PM Lorrine Kin, [place] wrote: CRM for notification. See Telephone encounter for: 11/17/18. Patient calling and would like to know if Dr Durene Cal could send him in a prescription for a muscle relaxer? States that he has been having back spasms. States he has had muscle relaxers before from Dr Durene Cal, but it has been a while. Please advise.

## 2018-11-18 ENCOUNTER — Telehealth: Payer: Self-pay | Admitting: Family Medicine

## 2018-11-18 ENCOUNTER — Other Ambulatory Visit: Payer: Self-pay

## 2018-11-18 DIAGNOSIS — Z1211 Encounter for screening for malignant neoplasm of colon: Secondary | ICD-10-CM

## 2018-11-18 NOTE — Telephone Encounter (Signed)
Patient is calling again to get the status of a muscle relaxant for his back from the doctor.  Patient states he is in a lot of pain.

## 2018-11-18 NOTE — Telephone Encounter (Signed)
webex with me

## 2018-11-18 NOTE — Telephone Encounter (Signed)
Copied from CRM [id number]. Topic: Quick Communication - See Telephone Encounter >> Nov 18, 2018  5:16 PM Jens Som A wrote: CRM for notification. See Telephone encounter for: 11/18/18.  Patient is calling because he just hung up with Dr. Pamala Hurry nurse. The nurse was not available. He wanted to know was it ok to take magnesium? Please advise

## 2018-11-18 NOTE — Telephone Encounter (Signed)
FYI Spoke to pt and advised him that he could have this question answered with Dr. Durene Cal. Pt also asked about Tylenol as well.

## 2018-11-18 NOTE — Telephone Encounter (Signed)
See note

## 2018-11-18 NOTE — Telephone Encounter (Signed)
Copied from CRM [id number]. Topic: Quick Communication - See Telephone Encounter >> Nov 18, 2018  5:11 PM Jens Som A wrote: CRM for notification. See Telephone encounter for: 11/18/18.  Paient is calling to speak back to Dr. Durene Cal nurse  he wanted to ask the nurse about taking magnsium if that is ok. Please advise Thank you

## 2018-11-19 ENCOUNTER — Ambulatory Visit (INDEPENDENT_AMBULATORY_CARE_PROVIDER_SITE_OTHER): Payer: Medicare Other | Admitting: Family Medicine

## 2018-11-19 ENCOUNTER — Encounter: Payer: Self-pay | Admitting: Family Medicine

## 2018-11-19 VITALS — Ht 72.0 in

## 2018-11-19 DIAGNOSIS — E119 Type 2 diabetes mellitus without complications: Secondary | ICD-10-CM | POA: Diagnosis not present

## 2018-11-19 DIAGNOSIS — M545 Low back pain, unspecified: Secondary | ICD-10-CM

## 2018-11-19 DIAGNOSIS — G9520 Unspecified cord compression: Secondary | ICD-10-CM

## 2018-11-19 DIAGNOSIS — E785 Hyperlipidemia, unspecified: Secondary | ICD-10-CM

## 2018-11-19 DIAGNOSIS — I5042 Chronic combined systolic (congestive) and diastolic (congestive) heart failure: Secondary | ICD-10-CM

## 2018-11-19 DIAGNOSIS — I10 Essential (primary) hypertension: Secondary | ICD-10-CM | POA: Diagnosis not present

## 2018-11-19 MED ORDER — TIZANIDINE HCL 2 MG PO CAPS: 2.0000 mg | ORAL_CAPSULE | Freq: Three times a day (TID) | ORAL | 1 refills | Status: DC

## 2018-11-19 NOTE — Telephone Encounter (Signed)
See note

## 2018-11-19 NOTE — Telephone Encounter (Signed)
Advise him we can chat at webex. Move webex if needed up

## 2018-11-19 NOTE — Progress Notes (Signed)
Phone [phone]   Subjective:  Virtual visit via Video note  Our team/I connected with Franklin Hicks on 11/19/18 at  2:40 PM EDT by a video enabled telemedicine application (webex) and verified that I am speaking with the correct person using two identifiers.  Location patient: [place] Location provider: [facility], office Persons participating in the virtual visit:   Our team/I discussed the limitations of evaluation and management by telemedicine and the availability of in person appointments. In light of current covid-19 pandemic, patient also understands that we are trying to protect them by minimizing in office contact if at all possible.  The patient expressed consent for telemedicine visit and agreed to proceed.   ROS-no increased weakness in the legs.  Complains of left low back pain.  No obvious sciatica.  Continues to have edema in legs but not worsening.  No fever chills reported.  Past Medical History-  Patient Active Problem List   Diagnosis Date Noted  . Aortic aneurysm (HCC) 12/16/2017    Priority: High  . Cord compression (HCC) 12/13/2017    Priority: High  . Chronic combined systolic and diastolic heart failure (HCC) 10/09/2016    Priority: High  . CAD S/P percutaneous coronary angioplasty 09/20/2016    Priority: High  . Morbid obesity with BMI of 50.0-59.9, adult (HCC) 07/18/2013    Priority: High  . History of pulmonary embolism 07/13/2013    Priority: High  . Diabetes mellitus type 2, controlled (HCC) 07/17/2010    Priority: High  . Bilateral lower extremity edema 12/08/2008    Priority: High  . Angioedema of lips 07/13/2013    Priority: Medium  . Dyslipidemia, goal LDL below 70 10/02/2011    Priority: Medium  . BPH with urinary obstruction 07/17/2010    Priority: Medium  . Gout 03/15/2007    Priority: Medium  . Essential hypertension 03/15/2007    Priority: Medium  . Erectile dysfunction 07/30/2015    Priority: Low  . Multinodular goiter  07/13/2013    Priority: Low  . SOB (shortness of breath) 10/02/2011    Priority: Low  . NEUROPATHY, IDIOPATHIC PERIPHERAL NEC 05/31/2007    Priority: Low  . Osteoarthritis 03/22/2007    Priority: Low  . Recurrent pulmonary embolism (HCC)   . Pain of left heel   . C4 spinal cord injury, sequela (HCC) 12/18/2017  . Tetraplegia (HCC) 12/18/2017    Medications- reviewed and updated Current Outpatient Medications  Medication Sig Dispense Refill  . acetaminophen (TYLENOL) 325 MG tablet Take 1-2 tablets (325-650 mg total) by mouth every 4 (four) hours as needed for mild pain.    Marland Kitchen aspirin EC 81 MG tablet Take 1 tablet (81 mg total) by mouth daily. 90 tablet 3  . atorvastatin (LIPITOR) 80 MG tablet TAKE 1 TABLET (80 MG TOTAL) BY MOUTH DAILY AT 6 PM. 90 tablet 3  . diclofenac sodium (VOLTAREN) 1 % GEL Apply 1 application topically 3 (three) times daily. Both knees 3 Tube 4  . docusate sodium (COLACE) 100 MG capsule Take 2 capsules (200 mg total) by mouth daily after supper. 60 capsule 0  . furosemide (LASIX) 20 MG tablet Take 1 tablet (20 mg total) by mouth every Monday, Wednesday, and Friday. 15 tablet 3  . gabapentin (NEURONTIN) 100 MG capsule TAKE 1 CAPSULE (100 MG) TWICE A DAY AND 2 CAPSULES (200) MG AT BEDTIME 360 capsule 1  . metoprolol tartrate (LOPRESSOR) 50 MG tablet Take 1 tablet (50 mg total) by mouth 2 (two) times  daily. 60 tablet 6  . Multiple Vitamin (MULTIVITAMIN WITH MINERALS) TABS tablet Take 1 tablet by mouth daily.    . Potassium Citrate 15 MEQ (1620 MG) TBCR Take 1 tablet by mouth 2 (two) times daily.  11  . rivaroxaban (XARELTO) 20 MG TABS tablet TAKE 1 TABLET (20 MG TOTAL) BY MOUTH DAILY WITH SUPPER. 30 tablet 11  . terazosin (HYTRIN) 10 MG capsule TAKE 1 CAPSULE (10 MG TOTAL) BY MOUTH AT BEDTIME. 90 capsule 1  . vitamin B-12 (CYANOCOBALAMIN) 100 MCG tablet Take 1 tablet (100 mcg total) by mouth daily.    . tizanidine (ZANAFLEX) 2 MG capsule Take 1 capsule (2 mg total) by  mouth 3 (three) times daily. 30 capsule 1   No current facility-administered medications for this visit.      Objective:  Ht 6' (1.829 m)   BMI 48.82 kg/m  Gen: NAD, resting comfortably Lungs: nonlabored, normal respiratory rate  Skin: warm, dry, no obvious rash     Assessment and Plan   # back pain S: sprained back muscle when he was getting out the bed he thinks. Started 4-5 days ago if not longer. Started with muscle spasms- particularly if makes a certain movement- feels a lot of spasm in left low back. Coughing makes it worse- stepping certain way or standing a long time.   Has taken some magnesium to see if that would help- felt like this helped slightly.  Has already tried heat/cold with some mild relief.   Has had similar issues a few years ago- muscle relaxant. No midline back pain. No recent fall or injury.  A/P: Patient with what sounds to be muscle spasms in left low back.  No midline pain.  No increased weakness-still walking with a walker.  Has had good success with muscle relaxant in the past-we will trial tizanidine  # Diabetes S: Diet controlled in the past Exercise and diet-exercise limited by need for walker as he is trying to move around as much as able Lab Results  Component Value Date   HGBA1C 6.5 03/30/2018   HGBA1C 6.5 (H) 12/13/2017   HGBA1C 6.8 (H) 09/24/2017   A/P: Patient would be very high risk for coming into clinic-would really like to go and get an A1c at this time but we opted for 56-month physical instead-he will call to schedule this  #hyperlipidemia S: Mild poorly controlled on atorvastatin 80 mg Lab Results  Component Value Date   CHOL 132 09/24/2017   HDL 43.30 09/24/2017   LDLCALC 73 09/24/2017   LDLDIRECT 72.0 10/09/2016   TRIG 78.0 09/24/2017   CHOLHDL 3 09/24/2017   A/P: Would really like to target LDL under 70-but already on max dose statin-if remains high at follow-up could consider Zetia  #hypertension/combined systolic and  diastolic heart failure S: controlled on metoprolol 50 mg twice daily, terazosin, Lasix 20 mg Monday Wednesday Friday. BP Readings from Last 3 Encounters:  10/13/18 135/78  07/13/18 (!) 145/82  07/01/18 104/60  A/P:  Stable. Continue current medications.   -Reports increased edema at times-encouraged him to continue to wear compression stockings and elevate legs as much as able.  We discussed possibly increasing Lasix but he would like to avoid that-also encouraged him to watch his weights and make sure they are not trending up   #Cord compression- patient has been able to get around with a walker thankfully.  Not currently wheelchair dependent thankfully.  No problem-specific Assessment & Plan notes found for this encounter.  Future Appointments  Date Time Provider Department Center  01/12/2019  3:00 PM Ranelle Oyster, MD [facility]   Return in about 3 months (around 02/19/2019) for physical.  Lab/Order associations: Acute left-sided low back pain without sciatica  Controlled type 2 diabetes mellitus without complication, without long-term current use of insulin (HCC)  Dyslipidemia, goal LDL below 70  Essential hypertension  Chronic combined systolic and diastolic heart failure (HCC)  Cord compression (HCC)  Meds ordered this encounter  Medications  . tizanidine (ZANAFLEX) 2 MG capsule    Sig: Take 1 capsule (2 mg total) by mouth 3 (three) times daily.    Dispense:  30 capsule    Refill:  1    Return precautions advised-specifically he will let us know have new or worsening symptoms or if he fails to improve in the next 2 to 6 weeks. Tana Conch, MD

## 2018-11-19 NOTE — Patient Instructions (Addendum)
Health Maintenance Due  Topic Date Due  . OPHTHALMOLOGY EXAM - encouraged him to call to get them to send to [place]- [place] optho 06/28/1961  . COLONOSCOPY- cologuard ordered yesterday  06/28/1999  . FOOT EXAM  09/24/2018  . HEMOGLOBIN A1C - need to update this- advised 3 month follow up for CPE 09/30/2018

## 2018-11-22 ENCOUNTER — Ambulatory Visit: Payer: Medicare Other | Admitting: Physical Therapy

## 2018-12-24 ENCOUNTER — Other Ambulatory Visit: Payer: Self-pay

## 2018-12-24 MED ORDER — METOPROLOL TARTRATE 50 MG PO TABS: 50.0000 mg | ORAL_TABLET | Freq: Two times a day (BID) | ORAL | 6 refills | Status: DC

## 2018-12-30 ENCOUNTER — Other Ambulatory Visit: Payer: Self-pay

## 2018-12-30 MED ORDER — FUROSEMIDE 20 MG PO TABS: 20.0000 mg | ORAL_TABLET | ORAL | 3 refills | Status: DC

## 2019-01-02 ENCOUNTER — Other Ambulatory Visit: Payer: Self-pay | Admitting: Family Medicine

## 2019-01-03 NOTE — Telephone Encounter (Signed)
Last OV 11/19/18 Last refill 11/19/18 #30/1 Next OV not scheduled

## 2019-01-12 ENCOUNTER — Telehealth: Payer: Self-pay | Admitting: Family Medicine

## 2019-01-12 ENCOUNTER — Telehealth: Payer: Self-pay | Admitting: Rehabilitative and Restorative Service Providers"

## 2019-01-12 ENCOUNTER — Encounter: Payer: Medicare Other | Admitting: Physical Medicine & Rehabilitation

## 2019-01-12 NOTE — Telephone Encounter (Signed)
Called and spoke with patient. He has been taking Metoprolol 50 mg BID, Terazosin 10 mg daily, and Lasix 20 mg M/W/F. He has been wearing compression sock/stockings. He denies HA, dizziness, visual changes, LE edema. He says that he has been feeling fine. His BP has been over 160 for the past 2 days and feels like this is likely d/t something that he ate. He says that prior to that his BP has been staying under 160. Schedule virtual visit for tomorrow at 4:00.   Forwarding to Dr. Durene Cal as Lorain Childes.

## 2019-01-12 NOTE — Telephone Encounter (Signed)
Forwarding to Dr. Hunter as FYI.  

## 2019-01-12 NOTE — Telephone Encounter (Signed)
Copied from CRM [id number]. Topic: General - Other >> Jan 12, 2019  1:47 PM Floria Raveling A wrote: Reason for CRM:   [name] with [facility] called in and stated that pt is in a care management program with them. Pt Bp has been running high, 160's and 170's.  She stated that pt was not going to reach out and does not feel as if there is a concern so she wanted to report this to dr   [phone] ext [phone]

## 2019-01-12 NOTE — Telephone Encounter (Signed)
That's far too high- would love to have patient on for video visit to discuss options for bringing this down. Is he taking all his current BP medications- that would be the first thing to confirm- see my last note.

## 2019-01-12 NOTE — Telephone Encounter (Signed)
Franklin Hicks was contacted via telephone to discuss his options as our rehab center begins to open up further in June.  He reported that he has been walking and exercising more and has improved his confidence.  When weather allows, he is walking up and down the ramp at his house and overall notes "I'm doing more than I was."   When discussing his Covid risk of complication score (7), comorbidities, etc, we noted the best time to come into the clinic would be Friday afternoon and consider doing telehealth.  He reports that since he is exercising more, he thinks he is ready to go to the pool, which was in our prior rehab plan of care.  PT to send Mr. Kone name to our aquatic therapist to add to the wait list. He is going to call back to be seen in clinic (Fridays when less busy due to higher covid risk score) or be seen by telehealth if any concerns arise.  Vinetta Brach, PT [facility] [address] [place], [place]  [address] Phone:  [phone] Fax:  [phone]

## 2019-01-13 ENCOUNTER — Ambulatory Visit (INDEPENDENT_AMBULATORY_CARE_PROVIDER_SITE_OTHER): Payer: Medicare Other | Admitting: Family Medicine

## 2019-01-13 ENCOUNTER — Encounter: Payer: Self-pay | Admitting: Family Medicine

## 2019-01-13 VITALS — BP 127/67 | Ht 72.0 in | Wt 360.0 lb

## 2019-01-13 VITALS — BP 137/78 | Ht 72.0 in | Wt 360.0 lb

## 2019-01-13 DIAGNOSIS — E1159 Type 2 diabetes mellitus with other circulatory complications: Secondary | ICD-10-CM

## 2019-01-13 DIAGNOSIS — I2699 Other pulmonary embolism without acute cor pulmonale: Secondary | ICD-10-CM | POA: Diagnosis not present

## 2019-01-13 DIAGNOSIS — E1169 Type 2 diabetes mellitus with other specified complication: Secondary | ICD-10-CM

## 2019-01-13 DIAGNOSIS — G9520 Unspecified cord compression: Secondary | ICD-10-CM

## 2019-01-13 DIAGNOSIS — I10 Essential (primary) hypertension: Secondary | ICD-10-CM

## 2019-01-13 DIAGNOSIS — E119 Type 2 diabetes mellitus without complications: Secondary | ICD-10-CM

## 2019-01-13 DIAGNOSIS — E785 Hyperlipidemia, unspecified: Secondary | ICD-10-CM

## 2019-01-13 DIAGNOSIS — I152 Hypertension secondary to endocrine disorders: Secondary | ICD-10-CM

## 2019-01-13 NOTE — Progress Notes (Addendum)
Phone [phone]   Subjective:  Virtual visit via Video note. Chief complaint: Chief Complaint  Patient presents with  . Hypertension    This visit type was conducted due to national recommendations for restrictions regarding the COVID-19 Pandemic (e.g. social distancing).  This format is felt to be most appropriate for this patient at this time balancing risks to patient and risks to population by having him in for in person visit.  No physical exam was performed (except for noted visual exam or audio findings with Telehealth visits).    Our team/I connected with Franklin Hicks at  4:00 PM EDT by a video enabled telemedicine application (doxy.me or caregility through epic) and verified that I am speaking with the correct person using two identifiers.  Location patient: [place] Location provider: [facility], office Persons participating in the virtual visit:  patient  Our team/I discussed the limitations of evaluation and management by telemedicine and the availability of in person appointments. In light of current covid-19 pandemic, patient also understands that we are trying to protect them by minimizing in office contact if at all possible.  The patient expressed consent for telemedicine visit and agreed to proceed. Patient understands insurance will be billed.   ROS- No chest pain. Stable shortness of breath- was better when was able to do therapy twice a week before covid 19. No headache or blurry vision. Compression stockings helping swelling.     Past Medical History-  Patient Active Problem List   Diagnosis Date Noted  . Recurrent pulmonary embolism (HCC)     Priority: High  . Aortic aneurysm (HCC) 12/16/2017    Priority: High  . Cord compression (HCC) 12/13/2017    Priority: High  . Chronic combined systolic and diastolic heart failure (HCC) 10/09/2016    Priority: High  . CAD S/P percutaneous coronary angioplasty 09/20/2016    Priority: High  . Morbid obesity (HCC)  07/18/2013    Priority: High  . Diabetes mellitus type 2, controlled (HCC) 07/17/2010    Priority: High  . Bilateral lower extremity edema 12/08/2008    Priority: High  . Angioedema of lips 07/13/2013    Priority: Medium  . Hyperlipidemia associated with type 2 diabetes mellitus (HCC) 10/02/2011    Priority: Medium  . BPH with urinary obstruction 07/17/2010    Priority: Medium  . Gout 03/15/2007    Priority: Medium  . Hypertension associated with diabetes (HCC) 03/15/2007    Priority: Medium  . Erectile dysfunction 07/30/2015    Priority: Low  . Multinodular goiter 07/13/2013    Priority: Low  . SOB (shortness of breath) 10/02/2011    Priority: Low  . NEUROPATHY, IDIOPATHIC PERIPHERAL NEC 05/31/2007    Priority: Low  . Osteoarthritis 03/22/2007    Priority: Low  . Pain of left heel   . C4 spinal cord injury, sequela (HCC) 12/18/2017  . Tetraplegia (HCC) 12/18/2017    Medications- reviewed and updated Current Outpatient Medications  Medication Sig Dispense Refill  . aspirin EC 81 MG tablet Take 1 tablet (81 mg total) by mouth daily. 90 tablet 3  . atorvastatin (LIPITOR) 80 MG tablet TAKE 1 TABLET (80 MG TOTAL) BY MOUTH DAILY AT 6 PM. 90 tablet 3  . furosemide (LASIX) 20 MG tablet Take 1 tablet (20 mg total) by mouth every Monday, Wednesday, and Friday. 15 tablet 3  . gabapentin (NEURONTIN) 100 MG capsule TAKE 1 CAPSULE (100 MG) TWICE A DAY AND 2 CAPSULES (200) MG AT BEDTIME 360 capsule 1  .  metoprolol tartrate (LOPRESSOR) 50 MG tablet Take 1 tablet (50 mg total) by mouth 2 (two) times daily. 60 tablet 6  . Potassium Citrate 15 MEQ (1620 MG) TBCR Take 1 tablet by mouth 2 (two) times daily.  11  . rivaroxaban (XARELTO) 20 MG TABS tablet TAKE 1 TABLET (20 MG TOTAL) BY MOUTH DAILY WITH SUPPER. 30 tablet 11  . terazosin (HYTRIN) 10 MG capsule TAKE 1 CAPSULE (10 MG TOTAL) BY MOUTH AT BEDTIME. 90 capsule 1  . acetaminophen (TYLENOL) 325 MG tablet Take 1-2 tablets (325-650 mg total)  by mouth every 4 (four) hours as needed for mild pain. (Patient not taking: Reported on 01/13/2019)    . diclofenac sodium (VOLTAREN) 1 % GEL Apply 1 application topically 3 (three) times daily. Both knees (Patient not taking: Reported on 01/13/2019) 3 Tube 4  . docusate sodium (COLACE) 100 MG capsule Take 2 capsules (200 mg total) by mouth daily after supper. (Patient not taking: Reported on 01/13/2019) 60 capsule 0  . Multiple Vitamin (MULTIVITAMIN WITH MINERALS) TABS tablet Take 1 tablet by mouth daily. (Patient not taking: Reported on 01/13/2019)    . vitamin B-12 (CYANOCOBALAMIN) 100 MCG tablet Take 1 tablet (100 mcg total) by mouth daily. (Patient not taking: Reported on 01/13/2019)     No current facility-administered medications for this visit.      Objective:  BP 127/67   Ht 6' (1.829 m)   Wt (!) 360 lb (163.3 kg)   BMI 48.82 kg/m  self reported vitals Gen: NAD, resting comfortably Lungs: nonlabored, normal respiratory rate  Skin: appears dry, no obvious rash Appears to be using his wheelchair at the minute     Assessment and Plan   % Cord compression- patient with severe injury April 2019- has used wheelchair since that time.  Is able to walk with his walker as well.  Stable today  #Recurrent pulmonary embolism (HCC) S: Prior DVT 2014 after traveling- believe led to PE. Recurrent PE 11/2017 after fall. Now on lifelong anticoagulation-Xarelto 20 mg A/P: Stable. Continue current medications. No worsening SOB   #hypertension S: controlled prior to mondays- was running in 150s. Ate some pork skins and went up to 160s or 170s- ate really late in the day. Yesterday was 162. Nurse has been coming out and checking as part of heart program he is in. He reports these checks are before he gets meds in.   He is trying to use walker more to be active around the house. Doing some exercises from seated position in the morning and before bed.   Compliant lasix 3 days a week, metoprolol 50mg   BID, terazosin 10mg . Takes potassium through urology BP Readings from Last 3 Encounters:  01/13/19 127/67  10/13/18 135/78  07/13/18 (!) 145/82  A/P: Blood pressure has been running high recently-may have been due to dietary indiscretions.  Patient has tightened up diet and blood pressure has improved at this time-encouraged him to continue to monitor and if persistently above 140 or averaging above 140 consider increasing Lasix perhaps even daily-would need to monitor kidney function and potassium at least short-term if increased dose  #hyperlipidemia S: Mild poorly controlled on atorvastatin 80mg  daily in the past-ideally LDL would be under 70 Lab Results  Component Value Date   CHOL 132 09/24/2017   HDL 43.30 09/24/2017   LDLCALC 73 09/24/2017   LDLDIRECT 72.0 10/09/2016   TRIG 78.0 09/24/2017   CHOLHDL 3 09/24/2017   A/P: overdue for lipid panel-advised for him to  come by for lipid panel-he wants me to reach out in a month-he is appropriately concerned about COVID-19.  Continue to work on weight loss  # Diabetes/morbid obesity.  S:  controlled on no rx previously.  Exercise and diet- trying to watch his sugar and pasta Lab Results  Component Value Date   HGBA1C 6.5 03/30/2018   HGBA1C 6.5 (H) 12/13/2017   HGBA1C 6.8 (H) 09/24/2017   A/P: patient agrees to consider coming by for a1c in 1 month- he knows hes overdue at present but concern about covid 19. Encouraged continued efforts with exercise. For weight- Encouraged need for healthy eating, regular exercise, weight loss.   Other notes: 1.Prior back pain resolved from last visit 2. Message him in a month to schedule labs - likely car visit Future Appointments  Date Time Provider Department Center  03/15/2019 11:00 AM Ranelle OysterSwartz, Zachary T, MD [facility]   Lab/Order associations: Controlled type 2 diabetes mellitus without complication, without long-term current use of insulin (HCC) - Plan: CBC, Comprehensive metabolic panel,  Lipid panel, Hemoglobin A1c  Recurrent pulmonary embolism (HCC)  Hyperlipidemia associated with type 2 diabetes mellitus (HCC)  Hypertension associated with diabetes (HCC)  Morbid obesity (HCC)  Cord compression (HCC)  Return precautions advised.  Tana ConchStephen , MD  Addendum 05/13/19-patient updates me with most recent blood pressure 137/78-updating chart as had been elevated at outside visit.  -Tana ConchStephen

## 2019-01-13 NOTE — Patient Instructions (Addendum)
Health Maintenance Due  Topic Date Due  . OPHTHALMOLOGY EXAM Pt stated that he requested results sent to our office at his last visit  06/28/1961  . COLONOSCOPY Cologuard kit  06/28/1999  . FOOT EXAM Postponed for future in office visit  09/24/2018  . HEMOGLOBIN A1C - next labs 09/30/2018  . TETANUS/TDAP Stop by your local pharmacy to have this done when Covid-19 calms 12/09/2018    Video visit

## 2019-01-13 NOTE — Assessment & Plan Note (Signed)
S: Prior DVT 2014 after traveling- believe led to PE. Recurrent PE 11/2017 after fall. Now on lifelong anticoagulation A/P: Stable. Continue current medications. No worsening SOB

## 2019-02-01 ENCOUNTER — Other Ambulatory Visit: Payer: Self-pay | Admitting: Family Medicine

## 2019-02-17 ENCOUNTER — Other Ambulatory Visit: Payer: Self-pay | Admitting: Physical Medicine & Rehabilitation

## 2019-02-17 DIAGNOSIS — S14104S Unspecified injury at C4 level of cervical spinal cord, sequela: Secondary | ICD-10-CM

## 2019-03-15 ENCOUNTER — Other Ambulatory Visit: Payer: Self-pay

## 2019-03-15 ENCOUNTER — Encounter: Payer: Self-pay | Admitting: Registered Nurse

## 2019-03-15 ENCOUNTER — Ambulatory Visit: Payer: Medicare Other | Admitting: Physical Medicine & Rehabilitation

## 2019-03-15 ENCOUNTER — Encounter: Payer: Medicare Other | Attending: Physical Medicine & Rehabilitation | Admitting: Registered Nurse

## 2019-03-15 VITALS — BP 143/85 | HR 72 | Temp 98.8°F | Ht 72.0 in | Wt 385.0 lb

## 2019-03-15 DIAGNOSIS — M170 Bilateral primary osteoarthritis of knee: Secondary | ICD-10-CM | POA: Diagnosis not present

## 2019-03-15 DIAGNOSIS — S14104S Unspecified injury at C4 level of cervical spinal cord, sequela: Secondary | ICD-10-CM

## 2019-03-15 DIAGNOSIS — G8250 Quadriplegia, unspecified: Secondary | ICD-10-CM | POA: Diagnosis not present

## 2019-03-15 MED ORDER — GABAPENTIN 100 MG PO CAPS: ORAL_CAPSULE | ORAL | 2 refills | Status: DC

## 2019-03-15 NOTE — Progress Notes (Signed)
Subjective:    Patient ID: Franklin Hicks, male    DOB: 05/22/1951, 68 y.o.   MRN: [id number]  HPI: Franklin Hicks is a 68 y.o. male who returns for follow up appointment for chronic pain and medication refill. He states his pain is located in his right shoulder and bilateral knees. He rates his  Pain 3. His  current exercise regime is walking with walker in his home  and performing stretching exercises.    Pain Inventory Average Pain 2 Pain Right Now 3 My pain is dull and aching  In the last 24 hours, has pain interfered with the following? General activity 1 Relation with others 0 Enjoyment of life 0 What TIME of day is your pain at its worst? daytime Sleep (in general) Good  Pain is worse with: unsure Pain improves with: medication Relief from Meds: 7  Mobility use a walker ability to climb steps?  no do you drive?  yes use a wheelchair  Function disabled: date disabled .  Neuro/Psych trouble walking  Prior Studies Any changes since last visit?  no  Physicians involved in your care Any changes since last visit?  no   Family History  Problem Relation Age of Onset  . Liver disease Mother    Social History   Socioeconomic History  . Marital status: Married    Spouse name: Not on file  . Number of children: Not on file  . Years of education: Not on file  . Highest education level: Not on file  Occupational History  . Occupation: Runner, broadcasting/film/videoteacher  Social Needs  . Financial resource strain: Not on file  . Food insecurity    Worry: Not on file    Inability: Not on file  . Transportation needs    Medical: Not on file    Non-medical: Not on file  Tobacco Use  . Smoking status: Former Smoker    Packs/day: 1.00    Years: 13.00    Pack years: 13.00    Types: Cigarettes    Quit date: 07/13/1977    Years since quitting: 41.6  . Smokeless tobacco: Never Used  Substance and Sexual Activity  . Alcohol use: Yes    Alcohol/week: 0.0 standard drinks    Comment: holidays  only  . Drug use: No  . Sexual activity: Not on file  Lifestyle  . Physical activity    Days per week: Not on file    Minutes per session: Not on file  . Stress: Not on file  Relationships  . Social Musicianconnections    Talks on phone: Not on file    Gets together: Not on file    Attends religious service: Not on file    Active member of club or organization: Not on file    Attends meetings of clubs or organizations: Not on file    Relationship status: Not on file  Other Topics Concern  . Not on file  Social History Narrative   Married for 44 years. He has 3 children and 5 grandchildren. They also 5 great grandchildren. He lives with his wife. 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 -> [employer]   Occasional beer or wine   No smoking history or illicit drug use.   One hour water aerobics sessions 3 days a week. Has not started back yet read from recent illness sounds.   Past Surgical History:  Procedure Laterality Date  . CARDIAC CATHETERIZATION N/A 09/19/2016   Procedure: Left  Heart Cath and Coronary Angiography;  Surgeon: Orpah CobbAjay Kadakia, MD;  Location: [facility] INVASIVE CV LAB;  Service: Cardiovascular: 95% proximal Ramus Intermedius --> PCI  . CARDIAC CATHETERIZATION N/A 09/19/2016   Procedure: Coronary Stent Intervention;  Surgeon: Yvonne Kendallhristopher End, MD;  [facility];  Service: Cardiovascular: 95% ramus intermedius;  Resolute Onyx 2.75 x 18 mm drug-eluting stent  . CYSTOSCOPY/RETROGRADE/URETEROSCOPY/STONE EXTRACTION WITH BASKET  1610,96042007,1999   ureteral stone   . MENISCUS REPAIR Left    knee open meniscetomy  . TRANSTHORACIC ECHOCARDIOGRAM  2004   no lvh nl ejection fraction  . TRANSTHORACIC ECHOCARDIOGRAM  09/19/2016   In setting of NSTEMI:  EF 45-50% with diffuse hypokinesis. GR 2 DD. Mild biatrial enlargement.   Past Medical History:  Diagnosis Date  . Arthritis    "knees" (12/17/2017)  . CAD S/P percutaneous coronary angioplasty 09/20/2016    Nstemi 08/2016. Dr. Algie CofferKadakia. DES- brilinta and asa. [facility]; 95% Ramus -> PCI Resolute Onyx DES 2.75 x 18  . Central cord syndrome (HCC) 12/17/2017  . Chronic combined systolic and diastolic heart failure (HCC) 10/09/2016   EF 45% and grade II diastolic after nstemi  . Diet-controlled diabetes mellitus (HCC)   . DJD (degenerative joint disease)   . Gout   . High cholesterol   . Hypertension   . NSTEMI (non-ST elevated myocardial infarction) (HCC) 09/20/2016   95% Ramus - > PCI   . Obesity   . Recurrent pulmonary embolism (HCC) 11/'14; 4/'19   a) Bilateral segmental and subsegmental pulmonary emboli with mild RV Strain.; b) after fall with C-spine Fxr --> Acute PE of right main pulmonary artery extending into multiple segments.   BP (!) 143/85   Pulse 72   Temp 98.8 F (37.1 C)   Ht 6' (1.829 m)   Wt (!) 385 lb (174.6 kg)   SpO2 95%   BMI 52.22 kg/m   Opioid Risk Score:   Fall Risk Score:  `1  Depression screen PHQ 2/9  Dep[facility] screen [facility] 01/13/2019 07/13/2018 07/01/2018 04/12/2018 03/10/2018 12/16/2016 10/09/2016  Decreased Interest 0 0 0 0 0 0 0  Down, Depressed, Hopeless 0 0 0 0 0 0 0  PHQ - 2 Score 0 0 0 0 0 0 0  Some recent data might be hidden     Review of Systems  Constitutional: Negative.   HENT: Negative.   Eyes: Negative.   Respiratory: Negative.   Cardiovascular: Negative.   Gastrointestinal: Negative.   Endocrine: Negative.   Genitourinary: Negative.   Musculoskeletal: Positive for arthralgias, gait problem and myalgias.  Skin: Negative.   Allergic/Immunologic: Negative.   Hematological: Negative.   Psychiatric/Behavioral: Negative.   All other systems reviewed and are negative.      Objective:   Physical Exam Vitals signs and nursing note reviewed.  Constitutional:      Appearance: Normal appearance. He is obese.  Neck:     Musculoskeletal: Normal range of motion and neck supple.  Cardiovascular:     Rate and  Rhythm: Normal rate and regular rhythm.     Pulses: Normal pulses.     Heart sounds: Normal heart sounds.  Pulmonary:     Effort: Pulmonary effort is normal.     Breath sounds: Normal breath sounds.  Musculoskeletal:     Right lower leg: Edema present.     Left lower leg: Edema present.     Comments: Normal Muscle Bulk and Muscle Testing Reveals:  Upper Extremities: Decreased ROM  90 Degrees  and Muscle Strength 4/5  Lower Extremities: Decreased ROM and Muscle Strength 4/5 Arrived in wheelchair    Skin:    General: Skin is warm and dry.  Neurological:     Mental Status: He is alert and oriented to person, place, and time.  Psychiatric:        Mood and Affect: Mood normal.        Behavior: Behavior normal.           Assessment & Plan:  1. Functional Deficits Secondary to C4 Spinal Cord Injury:Continue with HEP as Tolerated. 03/15/2019 2. Tetraplegia/ Chronic Pain: Continue Gabapentin . 03/15/2019 3. Bilateral OA of Bilateral Knees : Continue  Voltaren Gel. 03/15/2019 3. Morbid Obesity: Continue with Healthy Diet Regimen. 03/15/2019  20 minutes of face to face patient care time was spent during this visit. All questions was encouraged and answered.   F/U in 3 months

## 2019-03-22 ENCOUNTER — Other Ambulatory Visit: Payer: Self-pay | Admitting: Cardiology

## 2019-04-07 ENCOUNTER — Other Ambulatory Visit: Payer: Self-pay | Admitting: Family Medicine

## 2019-04-17 ENCOUNTER — Other Ambulatory Visit: Payer: Self-pay | Admitting: Family Medicine

## 2019-04-28 ENCOUNTER — Telehealth: Payer: Self-pay | Admitting: Cardiology

## 2019-04-28 NOTE — Telephone Encounter (Signed)
Attempted to call back- number would not except the extension number.

## 2019-04-28 NOTE — Telephone Encounter (Signed)
New Message:    Please call, she needs to know his normal oxygen level. His level ranges from 90 to 94. IIs that okay? Leave a message if she does not answer please.

## 2019-04-29 NOTE — Telephone Encounter (Signed)
Reviewed chart and it wlooks like at his OV with family med O2 sat 94-95% left detailed messagge on confidental VM.

## 2019-05-10 ENCOUNTER — Encounter: Payer: Self-pay | Admitting: Family Medicine

## 2019-05-13 ENCOUNTER — Telehealth: Payer: Self-pay | Admitting: Family Medicine

## 2019-05-13 NOTE — Progress Notes (Signed)
Updating home blood pressure reading- completed through my chart interaction

## 2019-05-13 NOTE — Patient Instructions (Signed)
Health Maintenance Due  Topic Date Due  . OPHTHALMOLOGY EXAM  06/28/1959  . COLONOSCOPY  06/28/1999  . FOOT EXAM  09/24/2018  . HEMOGLOBIN A1C  09/30/2018  . TETANUS/TDAP  12/09/2018  . INFLUENZA VACCINE  03/26/2019    - Flu shot today - High dose flu shot today - declines for this season - will complete later in flu season (please let us know if you get this at another location so we can update your chart)

## 2019-06-01 ENCOUNTER — Encounter: Payer: Self-pay | Admitting: Rehabilitative and Restorative Service Providers"

## 2019-06-01 NOTE — Therapy (Signed)
[facility] [address] [place], [place], [address] Phone: [phone]   Fax:  [phone]  Patient Details  Name: Franklin Hicks MRN: [id number] Date of Birth: 09/23/1950 Referring Provider:  Zachary Swartz, MD  Encounter Date: last encounter 11/10/18  PHYSICAL THERAPY DISCHARGE SUMMARY  Visits from Start of Care: 33  Current functional level related to goals / functional outcomes: See goals from 3/18 for patient status.    Clinic closed due to Covid.  PT has talked to patient re: options for return to clinic.  He was wishing to wait until community #s are lower.  Due to length of time since last visit, patient will need new evaluation with new referral to return to PT.   Remaining deficits: Status unknown- view from last visit on 11/10/18   Education / Equipment: Home program, home walking  Plan: Patient agrees to discharge.  Patient goals were not met. Patient is being discharged due to not returning since the last visit.  ?????      Thank you for the referral of this patient. Christina Weaver, MPT   WEAVER,CHRISTINA 06/01/2019, 7:42 AM  [facility] [address] [place], , [address] Phone: [phone]   Fax:  [phone] 

## 2019-06-13 ENCOUNTER — Other Ambulatory Visit: Payer: Self-pay | Admitting: Cardiology

## 2019-06-14 ENCOUNTER — Other Ambulatory Visit: Payer: Self-pay | Admitting: Cardiovascular Disease

## 2019-06-15 ENCOUNTER — Encounter: Payer: Medicare Other | Admitting: Registered Nurse

## 2019-06-16 ENCOUNTER — Encounter: Payer: Self-pay | Admitting: Family Medicine

## 2019-07-05 ENCOUNTER — Encounter: Payer: Medicare Other | Admitting: Registered Nurse

## 2019-07-08 ENCOUNTER — Other Ambulatory Visit: Payer: Self-pay | Admitting: Cardiovascular Disease

## 2019-07-13 ENCOUNTER — Encounter: Payer: Medicare Other | Attending: Registered Nurse | Admitting: Registered Nurse

## 2019-07-13 ENCOUNTER — Other Ambulatory Visit: Payer: Self-pay

## 2019-07-13 ENCOUNTER — Encounter: Payer: Self-pay | Admitting: Registered Nurse

## 2019-07-13 VITALS — BP 128/75 | HR 73 | Temp 97.8°F | Ht 72.0 in | Wt 380.0 lb

## 2019-07-13 DIAGNOSIS — G8250 Quadriplegia, unspecified: Secondary | ICD-10-CM | POA: Diagnosis present

## 2019-07-13 DIAGNOSIS — M170 Bilateral primary osteoarthritis of knee: Secondary | ICD-10-CM

## 2019-07-13 DIAGNOSIS — S14104S Unspecified injury at C4 level of cervical spinal cord, sequela: Secondary | ICD-10-CM | POA: Diagnosis present

## 2019-07-13 MED ORDER — GABAPENTIN 100 MG PO CAPS: ORAL_CAPSULE | ORAL | 2 refills | Status: DC

## 2019-07-13 NOTE — Progress Notes (Signed)
Subjective:    Patient ID: Franklin Hicks, male    DOB: 12/22/1950, 68 y.o.   MRN: [id number]  HPI: Franklin Hicks is a 68 y.o. male who returns for follow up appointment for chronic pain and medication refill. He states his pain is located in his bilateral knees. He rates his pain 2. His  current exercise regime is walking and performing stretching exercises.  Franklin Hicks would like to begin weaning off his gabapentin, he was given a titration schedule and instructed to call office with any questions or concerns.   Pain Inventory Average Pain 2 Pain Right Now 2 My pain is aching  In the last 24 hours, has pain interfered with the following? General activity 0 Relation with others 0 Enjoyment of life 0 What TIME of day is your pain at its worst? morning Sleep (in general) Fair  Pain is worse with: walking and some activites Pain improves with: therapy/exercise Relief from Meds: 2  Mobility use a walker ability to climb steps?  no do you drive?  yes  Function retired  Neuro/Psych No problems in this area  Prior Studies Any changes since last visit?  no  Physicians involved in your care Any changes since last visit?  no   Family History  Problem Relation Age of Onset  . Liver disease Mother    Social History   Socioeconomic History  . Marital status: Married    Spouse name: Not on file  . Number of children: Not on file  . Years of education: Not on file  . Highest education level: Not on file  Occupational History  . Occupation: Runner, broadcasting/film/videoteacher  Social Needs  . Financial resource strain: Not on file  . Food insecurity    Worry: Not on file    Inability: Not on file  . Transportation needs    Medical: Not on file    Non-medical: Not on file  Tobacco Use  . Smoking status: Former Smoker    Packs/day: 1.00    Years: 13.00    Pack years: 13.00    Types: Cigarettes    Quit date: 07/13/1977    Years since quitting: 42.0  . Smokeless tobacco: Never Used  Substance  and Sexual Activity  . Alcohol use: Yes    Alcohol/week: 0.0 standard drinks    Comment: holidays only  . Drug use: No  . Sexual activity: Not on file  Lifestyle  . Physical activity    Days per week: Not on file    Minutes per session: Not on file  . Stress: Not on file  Relationships  . Social Musicianconnections    Talks on phone: Not on file    Gets together: Not on file    Attends religious service: Not on file    Active member of club or organization: Not on file    Attends meetings of clubs or organizations: Not on file    Relationship status: Not on file  Other Topics Concern  . Not on file  Social History Narrative   Married for 44 years. He has 3 children and 5 grandchildren. They also 5 great grandchildren. He lives with his wife. 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 -> [employer]   Occasional beer or wine   No smoking history or illicit drug use.   One hour water aerobics sessions 3 days a week. Has not started back yet read from recent illness sounds.   Past Surgical History:  Procedure Laterality Date  . CARDIAC CATHETERIZATION N/A 09/19/2016   Procedure: Left Heart Cath and Coronary Angiography;  Surgeon: Orpah Cobb, MD;  Location: [facility] INVASIVE CV LAB;  Service: Cardiovascular: 95% proximal Ramus Intermedius --> PCI  . CARDIAC CATHETERIZATION N/A 09/19/2016   Procedure: Coronary Stent Intervention;  Surgeon: Yvonne Kendall, MD;  Location: [facility] INVASIVE CV LAB;  Service: Cardiovascular: 95% ramus intermedius;  Resolute Onyx 2.75 x 18 mm drug-eluting stent  . CYSTOSCOPY/RETROGRADE/URETEROSCOPY/STONE EXTRACTION WITH BASKET  8101,7510   ureteral stone   . MENISCUS REPAIR Left    knee open meniscetomy  . TRANSTHORACIC ECHOCARDIOGRAM  2004   no lvh nl ejection fraction  . TRANSTHORACIC ECHOCARDIOGRAM  09/19/2016   In setting of NSTEMI:  EF 45-50% with diffuse hypokinesis. GR 2 DD. Mild biatrial enlargement.   Past Medical History:  Diagnosis  Date  . Arthritis    "knees" (12/17/2017)  . CAD S/P percutaneous coronary angioplasty 09/20/2016   Nstemi 08/2016. Dr. Algie Coffer. DES- brilinta and asa. Requests change to [facility] cardiology; 95% Ramus -> PCI Resolute Onyx DES 2.75 x 18  . Central cord syndrome (HCC) 12/17/2017  . Chronic combined systolic and diastolic heart failure (HCC) 10/09/2016   EF 45% and grade II diastolic after nstemi  . Diet-controlled diabetes mellitus (HCC)   . DJD (degenerative joint disease)   . Gout   . High cholesterol   . Hypertension   . NSTEMI (non-ST elevated myocardial infarction) (HCC) 09/20/2016   95% Ramus - > PCI   . Obesity   . Recurrent pulmonary embolism (HCC) 11/'14; 4/'19   a) Bilateral segmental and subsegmental pulmonary emboli with mild RV Strain.; b) after fall with C-spine Fxr --> Acute PE of right main pulmonary artery extending into multiple segments.   There were no vitals taken for this visit.  Opioid Risk Score:   Fall Risk Score:  `1  Depression screen PHQ 2/9  Depression screen [facility] 2/9 01/13/2019 07/13/2018 07/01/2018 04/12/2018 03/10/2018 12/16/2016 10/09/2016  Decreased Interest 0 0 0 0 0 0 0  Down, Depressed, Hopeless 0 0 0 0 0 0 0  PHQ - 2 Score 0 0 0 0 0 0 0  Some recent data might be hidden     Review of Systems  Constitutional: Negative.   HENT: Negative.   Eyes: Negative.   Respiratory: Negative.   Cardiovascular: Negative.   Gastrointestinal: Negative.   Endocrine: Negative.   Genitourinary: Negative.   Musculoskeletal: Positive for arthralgias, gait problem and myalgias.  Skin: Negative.   Allergic/Immunologic: Negative.   Hematological: Negative.   Psychiatric/Behavioral: Negative.   All other systems reviewed and are negative.      Objective:   Physical Exam Vitals signs and nursing note reviewed.  Constitutional:      Appearance: Normal appearance.  Neck:     Musculoskeletal: Normal range of motion and neck supple.  Cardiovascular:     Rate and  Rhythm: Normal rate and regular rhythm.     Pulses: Normal pulses.     Heart sounds: Normal heart sounds.  Musculoskeletal:     Comments: Normal Muscle Bulk and Muscle Testing Reveals:  Upper Extremities: Decreased ROM 90 Degrees  and Muscle Strength 5/5 Lower Extremities: Right: Full ROM and Muscle Strength 5/5 Left: Decreased ROM and Muscle Strength 5/5 Arrived in wheelchair   Skin:    General: Skin is warm and dry.  Neurological:     Mental Status: He is alert and oriented to person, place, and time.  Psychiatric:  Mood and Affect: Mood normal.        Behavior: Behavior normal.           Assessment & Plan:  1. Functional Deficits Secondary to C4 Spinal Cord Injury:Continue with HEP as Tolerated. 07/13/2019 2. Tetraplegia/ Chronic Pain: Continue Gabapentin . 07/13/2019 3. Bilateral OA of Bilateral Knees : Continue Voltaren Gel. 07/13/2019 3. Morbid Obesity: Continue with Healthy Diet Regimen. 07/13/2019  15 minutes of face to face patient care time was spent during this visit. All questions was encouraged and answered.   F/U in 6 months

## 2019-07-13 NOTE — Patient Instructions (Addendum)
Gabapentin Wean:  1. For Week One : Take one capsule of  Gabapentin in the morning and two a bedtime for a week  Second Week: Take one Capsule of Gabapentin in the Morning and one Capsule at bedtime   If you are able to tolerate the wean Third Week: One Capsule of Gabapentin at bedtime only for a week then discontinue.    Call with any questions or concerns:   Zella Ball [phone]

## 2019-07-14 ENCOUNTER — Ambulatory Visit: Payer: Medicare Other | Admitting: Cardiology

## 2019-07-14 ENCOUNTER — Encounter: Payer: Self-pay | Admitting: Cardiology

## 2019-07-14 VITALS — BP 122/68 | HR 70 | Temp 96.1°F | Ht 72.0 in | Wt 380.0 lb

## 2019-07-14 DIAGNOSIS — E1159 Type 2 diabetes mellitus with other circulatory complications: Secondary | ICD-10-CM

## 2019-07-14 DIAGNOSIS — I712 Thoracic aortic aneurysm, without rupture, unspecified: Secondary | ICD-10-CM

## 2019-07-14 DIAGNOSIS — I10 Essential (primary) hypertension: Secondary | ICD-10-CM

## 2019-07-14 DIAGNOSIS — Z9861 Coronary angioplasty status: Secondary | ICD-10-CM | POA: Diagnosis not present

## 2019-07-14 DIAGNOSIS — I2699 Other pulmonary embolism without acute cor pulmonale: Secondary | ICD-10-CM | POA: Diagnosis not present

## 2019-07-14 DIAGNOSIS — I2510 Atherosclerotic heart disease of native coronary artery without angina pectoris: Secondary | ICD-10-CM | POA: Diagnosis not present

## 2019-07-14 DIAGNOSIS — I5042 Chronic combined systolic (congestive) and diastolic (congestive) heart failure: Secondary | ICD-10-CM | POA: Diagnosis not present

## 2019-07-14 DIAGNOSIS — R600 Localized edema: Secondary | ICD-10-CM

## 2019-07-14 DIAGNOSIS — I152 Hypertension secondary to endocrine disorders: Secondary | ICD-10-CM

## 2019-07-14 NOTE — Progress Notes (Signed)
Primary Care Provider: Shelva Majestic, MD Cardiologist: No primary care provider on file. Electrophysiologist:   Clinic Note: Chief Complaint  Patient presents with  . Follow-up    12 months.  . Coronary Artery Disease    No angina    HPI:    Franklin Hicks is a 68 y.o. male with a history of non-STEMI s/pDES PCI-RI (January 2018), recurrent PE (most recently April 2019), and now central cord syndrome following C-spine injury in April 2019 who presents today for annual follow-up.  Garrit Marrow Yusko is a former patient of Dr. Algie Coffer (at the time of his MI).  He was last seen on 05/26/2018 -he was 6 months out from his central cord syndrome related injury having completed physical therapy and Occupational Therapy.  Not able to be all that active physically.  Had a wound/ulcer that was not healing well.  But no anginal or heart failure symptoms.  Rare palpitations.  Recent Hospitalizations: None  Reviewed  CV studies:    The following studies were reviewed today: (if available, images/films reviewed: From Epic Chart or Care Everywhere) . None since Echo in 2018:   Interval History:   AAHIL FREDIN returns here today for annual follow-up overall feeling pretty stable.  He is not having any active cardiac symptoms to speak of.  The major issue he notes is bilateral swelling that usually is controlled with his standing dose of Lasix that he only takes maybe 3 days a week along with a support stockings.  He remains pretty much limited to walking with a walker short distances around the house but otherwise is wheelchair-bound for long distances after his paraplegia incident. With the amount of exertion he is able to do, he denies any chest tightness or pressure with rest or exertion.  Does not really exert himself enough to notice any dyspnea.   No longer having any significant palpitations.  No syncope or near syncope..  No TIA or amaurosis fugax.  The patient does not have symptoms  concerning for COVID-19 infection (fever, chills, cough, or new shortness of breath).  The patient is practicing social distancing. ++ Masking.  He does not go out for groceries/shopping.  Lives in a home that has 2 sets of small kids and 3-4 generations.  Concerned about kids going back to school etc.   REVIEWED OF SYSTEMS   A comprehensive ROS was performed. Review of Systems  Constitutional: Positive for malaise/fatigue (Partially because he is just very inactive). Negative for weight loss.  HENT: Negative for congestion and nosebleeds.   Respiratory: Negative for shortness of breath.   Gastrointestinal: Positive for constipation. Negative for abdominal pain, blood in stool, heartburn and melena.  Genitourinary: Negative for hematuria.  Musculoskeletal: Negative for falls (Has not had any falls out of his bed recently) and joint pain.  Neurological: Positive for weakness (Chronic leg weakness and arm weakness from paraplegia) and headaches. Negative for dizziness.  Endo/Heme/Allergies: Positive for environmental allergies. Bruises/bleeds easily.  Psychiatric/Behavioral: Negative for memory loss. The patient is not nervous/anxious (Just anxious about the COVID-19 scare) and does not have insomnia.   All other systems reviewed and are negative.    I have reviewed and (if needed) personally updated the patient's problem list, medications, allergies, past medical and surgical history, social and family history.   PAST MEDICAL HISTORY   Past Medical History:  Diagnosis Date  . Arthritis    "knees" (12/17/2017)  . CAD S/P percutaneous coronary angioplasty 09/20/2016  Nstemi 08/2016. Dr. Algie Coffer. DES- brilinta and asa. Requests change to [facility] cardiology; 95% Ramus -> PCI Resolute Onyx DES 2.75 x 18  . Central cord syndrome (HCC) 12/17/2017  . Chronic combined systolic and diastolic heart failure (HCC) 10/09/2016   EF 45% and grade II diastolic after nstemi  . Diet-controlled diabetes  mellitus (HCC)   . DJD (degenerative joint disease)   . Gout   . High cholesterol   . Hypertension   . NSTEMI (non-ST elevated myocardial infarction) (HCC) 09/20/2016   95% Ramus - > PCI   . Obesity   . Recurrent pulmonary embolism (HCC) 11/'14; 4/'19   a) Bilateral segmental and subsegmental pulmonary emboli with mild RV Strain.; b) after fall with C-spine Fxr --> Acute PE of right main pulmonary artery extending into multiple segments.     PAST SURGICAL HISTORY   Past Surgical History:  Procedure Laterality Date  . CARDIAC CATHETERIZATION N/A 09/19/2016   Procedure: Left Heart Cath and Coronary Angiography;  Surgeon: Orpah Cobb, MD;  Location: [facility] INVASIVE CV LAB;  Service: Cardiovascular: 95% proximal Ramus Intermedius --> PCI  . CARDIAC CATHETERIZATION N/A 09/19/2016   Procedure: Coronary Stent Intervention;  Surgeon: Yvonne Kendall, MD;  Location: [facility] INVASIVE CV LAB;  Service: Cardiovascular: 95% ramus intermedius;  Resolute Onyx 2.75 x 18 mm drug-eluting stent  . CYSTOSCOPY/RETROGRADE/URETEROSCOPY/STONE EXTRACTION WITH BASKET  8119,1478   ureteral stone   . MENISCUS REPAIR Left    knee open meniscetomy  . TRANSTHORACIC ECHOCARDIOGRAM  2004   no lvh nl ejection fraction  . TRANSTHORACIC ECHOCARDIOGRAM  09/19/2016   In setting of NSTEMI:  EF 45-50% with diffuse hypokinesis. GR 2 DD. Mild biatrial enlargement.    Cardiac cath 09/19/2016: 95% ramus intermedius;  Resolute Onyx 2.75 x 18 mm drug-eluting stent        MEDICATIONS/ALLERGIES   Current Meds  Medication Sig  . acetaminophen (TYLENOL) 325 MG tablet Take 1-2 tablets (325-650 mg total) by mouth every 4 (four) hours as needed for mild pain.  Marland Kitchen aspirin EC 81 MG tablet Take 1 tablet (81 mg total) by mouth daily.  Marland Kitchen atorvastatin (LIPITOR) 80 MG tablet TAKE 1 TABLET (80 MG TOTAL) BY MOUTH DAILY AT 6 PM.  . diclofenac sodium (VOLTAREN) 1 % GEL Apply 1 application topically 3 (three) times daily. Both knees  . docusate  sodium (COLACE) 100 MG capsule Take 2 capsules (200 mg total) by mouth daily after supper.  . furosemide (LASIX) 20 MG tablet TAKE 1 TABLET BY MOUTH EVERY MONDAY, WEDNESDAY, AND FRIDAY. NEED OFFICE VISIT FOR MORE REFILLS  . gabapentin (NEURONTIN) 100 MG capsule One capsule twice a day and two capsules at bedtime. (Patient taking differently: Two capsule  a day and two capsules at bedtime.)  . metoprolol tartrate (LOPRESSOR) 50 MG tablet TAKE 1 TABLET BY MOUTH TWICE A DAY  . Multiple Vitamin (MULTIVITAMIN WITH MINERALS) TABS tablet Take 1 tablet by mouth daily.  . Potassium Citrate 15 MEQ (1620 MG) TBCR Take 1 tablet by mouth 2 (two) times daily.  Marland Kitchen terazosin (HYTRIN) 10 MG capsule TAKE 1 CAPSULE (10 MG TOTAL) BY MOUTH AT BEDTIME. (Patient taking differently: Take 10 mg by mouth at bedtime. TAKE 1 CAPSULE (10 MG TOTAL) BY MOUTH AT BEDTIME.)  . vitamin B-12 (CYANOCOBALAMIN) 100 MCG tablet Take 1 tablet (100 mcg total) by mouth daily.  [name] 20 MG TABS tablet TAKE 1 TABLET (20 MG TOTAL) BY MOUTH DAILY WITH SUPPER.    Allergies  Allergen Reactions  .  Ace Inhibitors     Angioedema  . Other Hives and Itching    msg     SOCIAL HISTORY/FAMILY HISTORY   Social History   Tobacco Use  . Smoking status: Former Smoker    Packs/day: 1.00    Years: 13.00    Pack years: 13.00    Types: Cigarettes    Quit date: 07/13/1977    Years since quitting: 42.0  . Smokeless tobacco: Never Used  Substance Use Topics  . Alcohol use: Yes    Alcohol/week: 0.0 standard drinks    Comment: holidays only  . Drug use: No   Social History   Social History Narrative   Married for 44 years. He has 3 children and 5 grandchildren. They also 5 great grandchildren. He lives with his wife. 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 -> [employer] education   Occasional beer or wine   No smoking history or illicit drug use.   One hour water aerobics sessions 3 days a week. Has not started back  yet read from recent illness sounds.    Family History family history includes Liver disease in his mother.   OBJCTIVE -PE, EKG, labs   Wt Readings from Last 3 Encounters:  07/14/19 (!) 380 lb (172.4 kg)  07/13/19 (!) 380 lb (172.4 kg)  03/15/19 (!) 385 lb (174.6 kg)    Physical Exam: BP 122/68 (BP Location: Right Arm, Patient Position: Sitting, Cuff Size: Large)   Pulse 70   Temp (!) 96.1 F (35.6 C)   Ht 6' (1.829 m)   Wt (!) 380 lb (172.4 kg)   BMI 51.54 kg/m  Physical Exam  Constitutional: He is oriented to person, place, and time.  Cardiovascular: Normal rate, regular rhythm and intact distal pulses.  Occasional extrasystoles are present. PMI is not displaced (Unable to assess). Exam reveals no gallop and no friction rub.  Murmur (1/6C-D SEM at R and L USB, no radiation.) heard. Pulmonary/Chest: Effort normal and breath sounds normal. No respiratory distress. He has no wheezes. He has no rales.  Abdominal: Soft. Bowel sounds are normal. He exhibits no distension. There is no abdominal tenderness.  Obese; unable to assess HSM  Musculoskeletal: Normal range of motion.        General: Edema (Likely 1-2+ BLE edema however wearing support stockings) present.  Neurological: He is alert and oriented to person, place, and time.  Skin: Skin is warm and dry.  Psychiatric: His behavior is normal. Judgment and thought content normal.  Normal affect.  Seems just a little bit down.  Vitals reviewed.    Adult ECG Report  Rate: 70;  Rhythm: normal sinus rhythm and Normal axis, intervals and durations.;   Narrative Interpretation: Stable/normal EKG  Recent Labs:   Should be due for labs ordered by PCP -> he has just been reluctant to go to have them checked. Lab Results  Component Value Date   CHOL 132 09/24/2017   HDL 43.30 09/24/2017   LDLCALC 73 09/24/2017   LDLDIRECT 72.0 10/09/2016   TRIG 78.0 09/24/2017   CHOLHDL 3 09/24/2017   Lab Results  Component Value Date    CREATININE 0.96 03/30/2018   BUN 26 (H) 03/30/2018   NA 140 03/30/2018   K 4.2 03/30/2018   CL 103 03/30/2018   CO2 30 03/30/2018    ASSESSMENT/PLAN    Problem List Items Addressed This Visit    CAD S/P percutaneous coronary angioplasty - Primary (Chronic)   Relevant Orders  EKG 12-Lead   Bilateral lower extremity edema (Chronic)    Probably related to being relatively sedentary.  Pretty well controlled with support stockings and intermittent Lasix.      Chronic combined systolic and diastolic heart failure (HCC) (Chronic)    Mildly reduced EF by echo post MI.  Probably not enough for him to be overly symptomatic. Is on stable dose of beta-blocker.  Not on ARB/ACE inhibitor because of hypersensitivity/angioedema.  Is on statin and aspirin.  Not on antiplatelet agent because of rivaroxaban/Xarelto.      Relevant Orders   EKG 12-Lead   Aortic aneurysm (HCC) (Chronic)    I would probably not consider this to be an aneurysm in the thoracic aorta.  Probably just upper limit of normal finding.  Can be monitored intermittently by relook echo in the future.      Recurrent pulmonary embolism (HCC) (Chronic)    Recurrent PE in April 2019 likely after his fall.  Now on lifelong anticoagulation with rivaroxaban.  No bleeding issues. Would be okay to stop aspirin if bruising worsens.      Hypertension associated with diabetes (HCC)    Blood pressure looks pretty well controlled on current dose of Lopressor.  Not on ACE inhibitor/ARB because of hypersensitivity concerns.  Would consider calcium channel blocker such as amlodipine if additional blood pressure therapy is required.      Relevant Orders   EKG 12-Lead   Morbid obesity (HCC)    Exacerbated by the fact that he is wheelchair-bound essentially.  Not really able to exercise.  Discussed importance of dietary modification.          COVID-19 Education: The signs and symptoms of COVID-19 were discussed with the patient and  how to seek care for testing (follow up with PCP or arrange E-visit).   The importance of social distancing was discussed today.  A good portion of this visit was based on discussion of his concerns about COVID-19 and possible exposures.  He has been reluctant to go get labs checked, reluctant to go for clinic appointments etc.  I tried to reassure him that he is probably safe going to doctors appointments and even potentially rehab.  I spent a total of 24minutes with the patient and chart review. >  50% of the time was spent in direct patient consultation.  Additional time spent with chart review (studies, outside notes, etc): 6 Total Time: 30 min   Current medicines are reviewed at length with the patient today.  (+/- concerns) n/a   Patient Instructions / Medication Changes & Studies & Tests Ordered   Patient Instructions  Medication Instructions:  Not needed *If you need a refill on your cardiac medications before your next appointment, please call your pharmacy*  Lab Work: Ok to have labwork done by primary  If you have labs (blood work) drawn today and your tests are completely normal, you will receive your results only by: Marland Kitchen. MyChart Message (if you have MyChart) OR . A paper copy in the mail If you have any lab test that is abnormal or we need to change your treatment, we will call you to review the results.  Testing/Procedures: Not needed  Follow-Up: At [facility], you and your health needs are our priority.  As part of our continuing mission to provide you with exceptional heart care, we have created designated Provider Care Teams.  These Care Teams include your primary Cardiologist (physician) and Advanced Practice Providers (APPs -  Physician Assistants and  Nurse Practitioners) who all work together to provide you with the care you need, when you need it.  Your next appointment:   12 month(s)  The format for your next appointment:   In Person  Provider:   Glenetta Hew, MD  Other Instructions     Studies Ordered:   Orders Placed This Encounter  Procedures  . EKG 12-Lead     Glenetta Hew, M.D., M.S. Interventional Cardiologist   Pager # [phone] Phone # [phone] [address] [place], [place] [address]   Thank you for choosing Heartcare at [facility]!!

## 2019-07-14 NOTE — Patient Instructions (Signed)
Medication Instructions:  Not needed *If you need a refill on your cardiac medications before your next appointment, please call your pharmacy*  Lab Work: Ok to have labwork done by primary  If you have labs (blood work) drawn today and your tests are completely normal, you will receive your results only by: Marland Kitchen MyChart Message (if you have MyChart) OR . A paper copy in the mail If you have any lab test that is abnormal or we need to change your treatment, we will call you to review the results.  Testing/Procedures: Not needed  Follow-Up: At [facility], you and your health needs are our priority.  As part of our continuing mission to provide you with exceptional heart care, we have created designated Provider Care Teams.  These Care Teams include your primary Cardiologist (physician) and Advanced Practice Providers (APPs -  Physician Assistants and Nurse Practitioners) who all work together to provide you with the care you need, when you need it.  Your next appointment:   12 month(s)  The format for your next appointment:   In Person  Provider:   Glenetta Hew, MD  Other Instructions

## 2019-07-16 ENCOUNTER — Encounter: Payer: Self-pay | Admitting: Cardiology

## 2019-07-16 NOTE — Assessment & Plan Note (Signed)
Probably related to being relatively sedentary.  Pretty well controlled with support stockings and intermittent Lasix.

## 2019-07-16 NOTE — Assessment & Plan Note (Signed)
Mildly reduced EF by echo post MI.  Probably not enough for him to be overly symptomatic. Is on stable dose of beta-blocker.  Not on ARB/ACE inhibitor because of hypersensitivity/angioedema.  Is on statin and aspirin.  Not on antiplatelet agent because of rivaroxaban/Xarelto.

## 2019-07-16 NOTE — Assessment & Plan Note (Signed)
I would probably not consider this to be an aneurysm in the thoracic aorta.  Probably just upper limit of normal finding.  Can be monitored intermittently by relook echo in the future.

## 2019-07-16 NOTE — Assessment & Plan Note (Signed)
Exacerbated by the fact that he is wheelchair-bound essentially.  Not really able to exercise.  Discussed importance of dietary modification.

## 2019-07-16 NOTE — Assessment & Plan Note (Signed)
Blood pressure looks pretty well controlled on current dose of Lopressor.  Not on ACE inhibitor/ARB because of hypersensitivity concerns.  Would consider calcium channel blocker such as amlodipine if additional blood pressure therapy is required.

## 2019-07-16 NOTE — Assessment & Plan Note (Signed)
Recurrent PE in April 2019 likely after his fall.  Now on lifelong anticoagulation with rivaroxaban.  No bleeding issues. Would be okay to stop aspirin if bruising worsens.

## 2019-08-01 ENCOUNTER — Other Ambulatory Visit: Payer: Self-pay | Admitting: Cardiovascular Disease

## 2019-08-11 ENCOUNTER — Other Ambulatory Visit: Payer: Self-pay | Admitting: Family Medicine

## 2019-09-14 ENCOUNTER — Ambulatory Visit: Payer: Medicare Other | Admitting: Physical Medicine & Rehabilitation

## 2019-09-20 LAB — HM DIABETES EYE EXAM

## 2019-09-23 DIAGNOSIS — H40023 Open angle with borderline findings, high risk, bilateral: Secondary | ICD-10-CM | POA: Diagnosis not present

## 2019-09-23 DIAGNOSIS — E119 Type 2 diabetes mellitus without complications: Secondary | ICD-10-CM | POA: Diagnosis not present

## 2019-10-14 ENCOUNTER — Encounter: Payer: Self-pay | Admitting: Family Medicine

## 2019-10-24 ENCOUNTER — Telehealth: Payer: Self-pay | Admitting: Family Medicine

## 2019-10-24 NOTE — Telephone Encounter (Signed)
Left patient vm to call back to schedule appointment with Dr. Durene Cal.  It has almost been a year since last seen.  Can schedule CPE.

## 2019-11-07 ENCOUNTER — Encounter: Payer: Self-pay | Admitting: Family Medicine

## 2019-11-26 IMAGING — MR MR CERVICAL SPINE W/O CM
6 of 7 series · 30 of 48 positions shown · non-contrast
Comparison: 12/13/2017 CT cervical spine.

CLINICAL DATA: 66 y/o M; fall in bathtub. Tingling and weakness in
the upper and lower extremities.

EXAM:
MRI CERVICAL SPINE WITHOUT CONTRAST
TECHNIQUE: Multiplanar, multisequence MR imaging of the cervical spine was
performed. No intravenous contrast was administered.

[Series 5001: T2 · sagittal · 3.0mm · 0.69mm/px · 3 of 15 slices shown (1 of 2)]
[im 1/15]
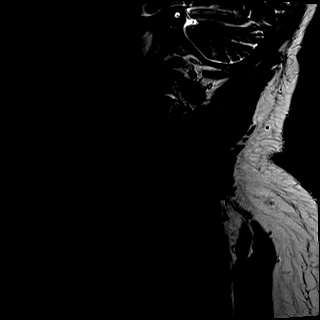
[im 8/15]
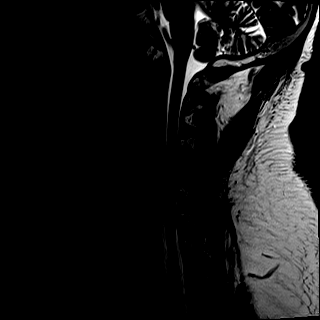
[im 15/15]
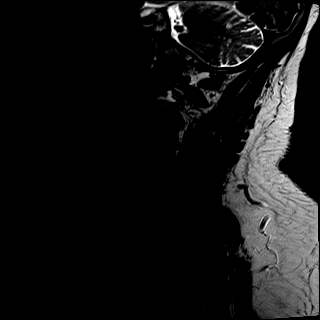

[Series 6001: T1 · sagittal · 3.0mm · 0.69mm/px · 3 of 15 slices shown]
[im 1/15]
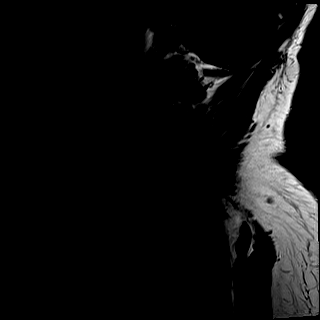
[im 8/15]
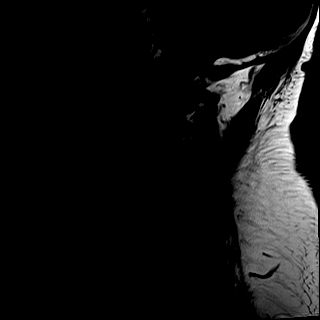
[im 15/15]
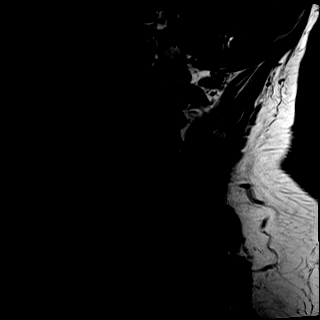

[Series 7001: STIR · sagittal · 3.0mm · 0.86mm/px · 4 of 15 slices shown]
[im 1/15]
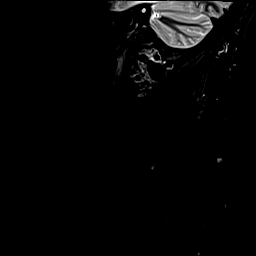
[im 5/15]
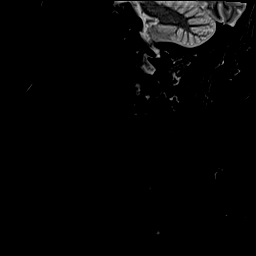
[im 10/15]
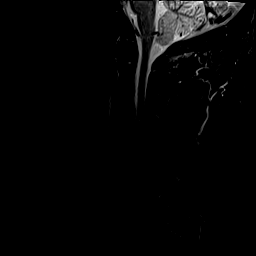
[im 15/15]
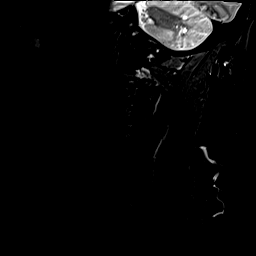

[Series 8001: T2 · axial · 3.0mm · 0.66mm/px · z∈[-106,+55]mm · 9 of 50 slices shown (2 of 2)]
[im 1/50]
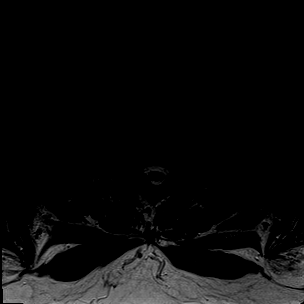
[im 9/50]
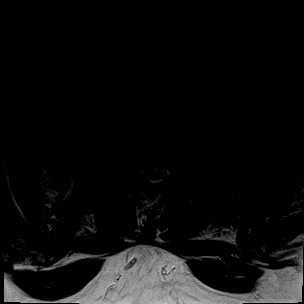
[im 14/50]
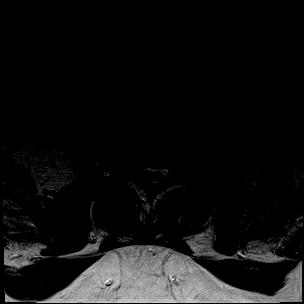
[im 23/50]
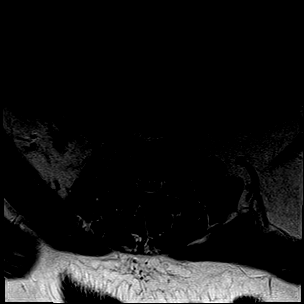
[im 27/50]
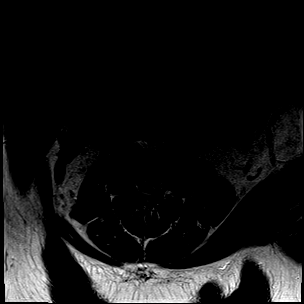
[im 36/50]
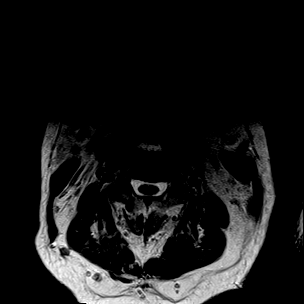
[im 41/50]
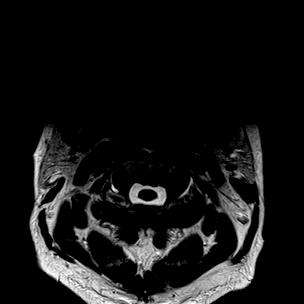
[im 45/50]
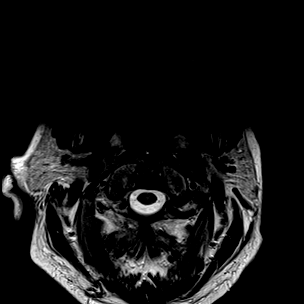
[im 50/50]
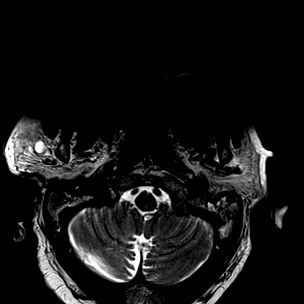

[Series 9001: t2_(id)_tra_2mm · axial · 4.0mm · 0.35mm/px · z∈[-106,+66]mm · 8 of 44 slices shown]
[im 1/44]
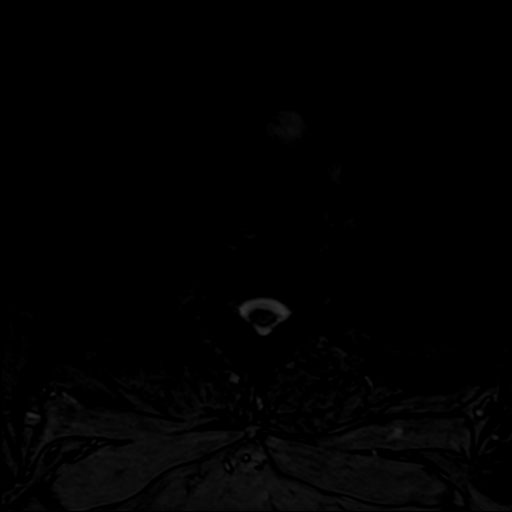
[im 5/44]
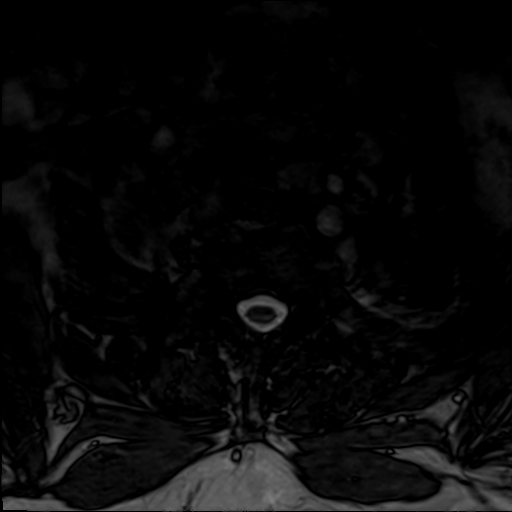
[im 15/44]
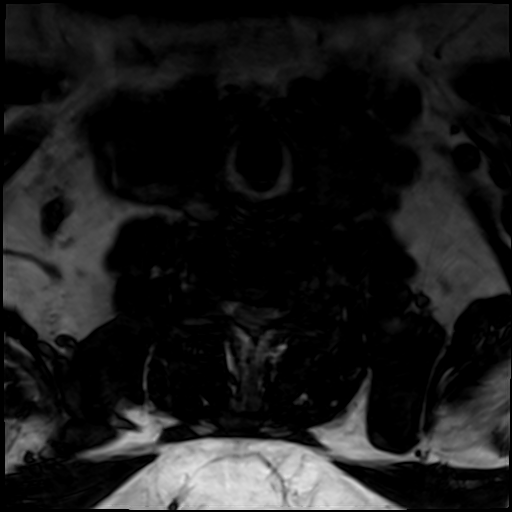
[im 20/44]
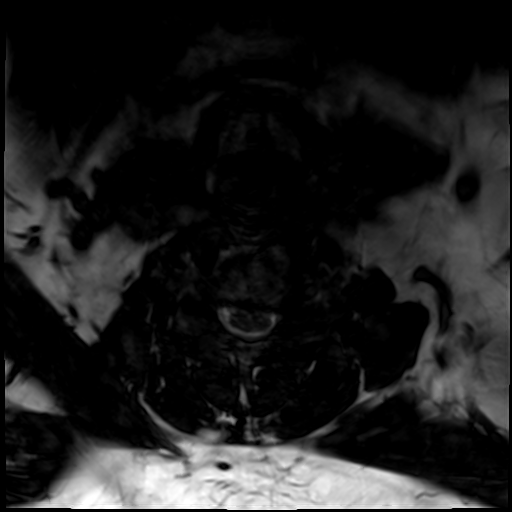
[im 24/44]
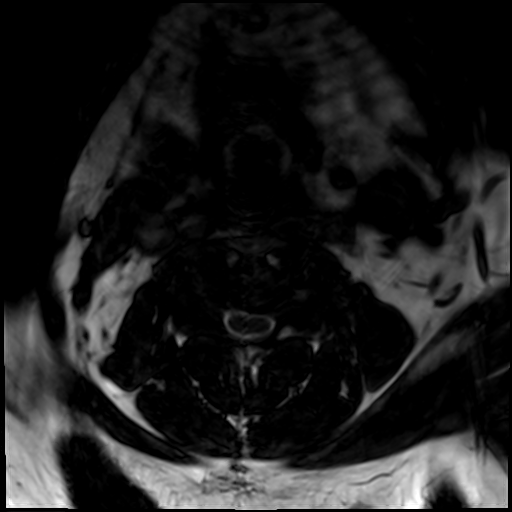
[im 29/44]
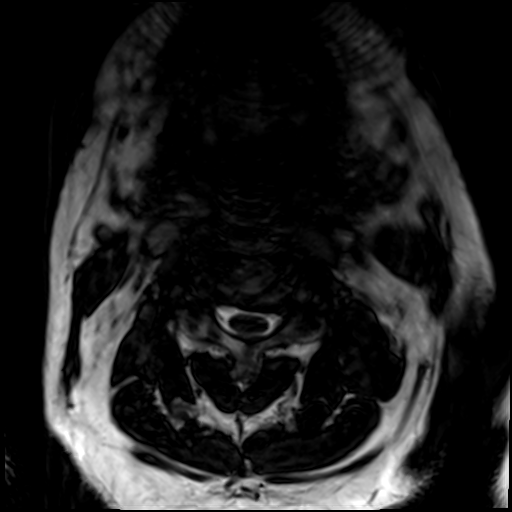
[im 39/44]
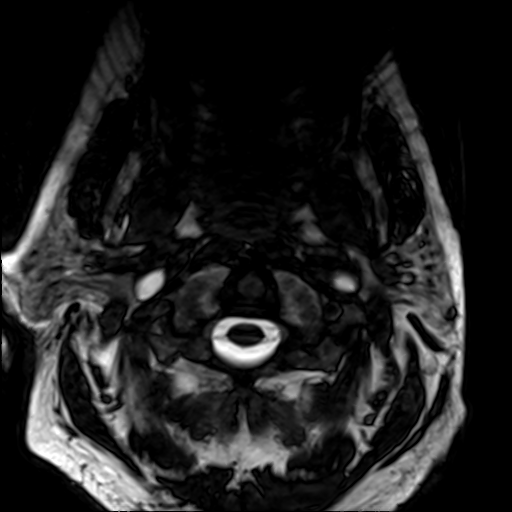
[im 44/44]
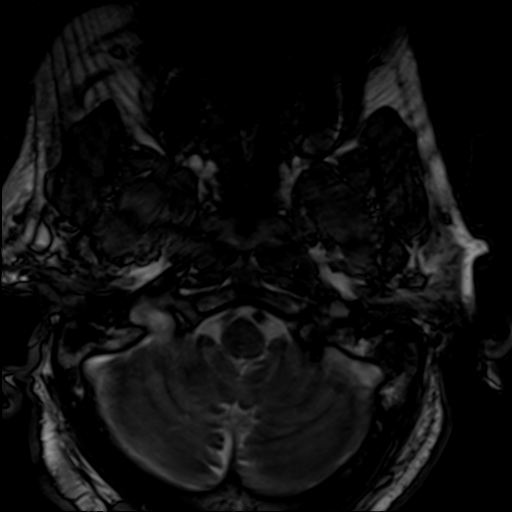

[GRE · axial · 3.0mm · 0.39mm/px · z∈[-106,-63]mm · 3 of 50 slices shown]
[im 1/50]
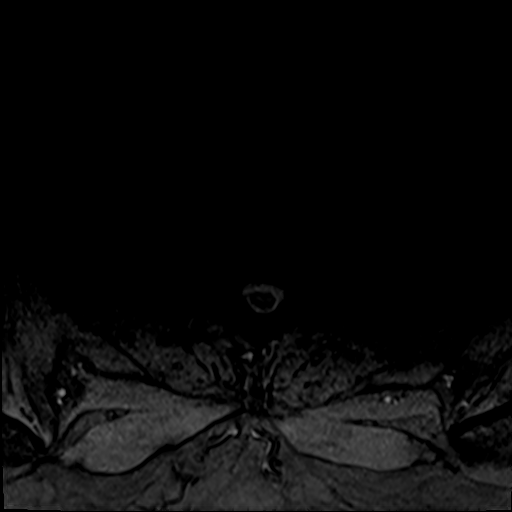
[im 9/50]
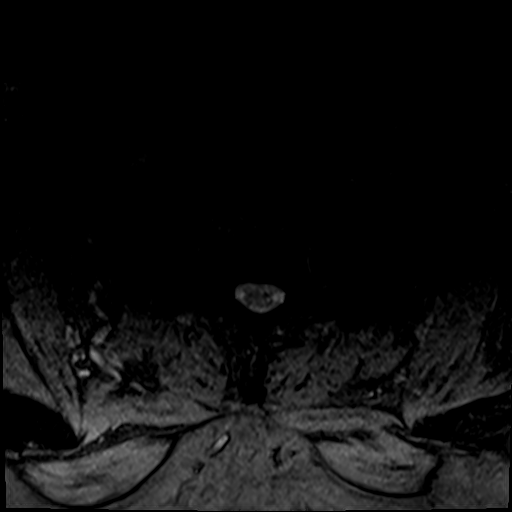
[im 14/50]
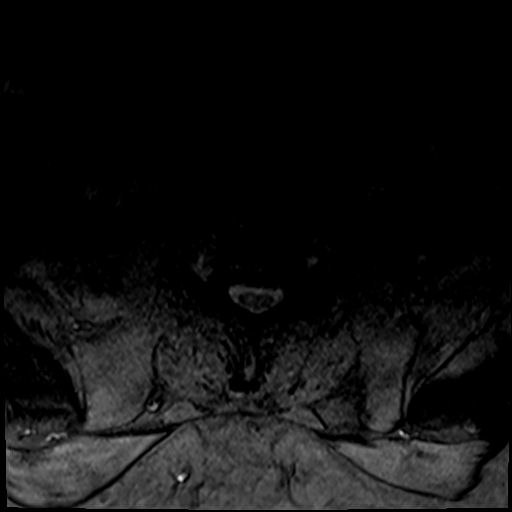

[30 of 48 positions shown; findings below may reference images not displayed]

FINDINGS: Alignment: Straightening of cervical lordosis.

Vertebrae: No fracture, evidence of discitis, or bone lesion.

Cord: Faint increased T2 cord signal in gray matter at the C4 level
(series 79999, image 23). No cord hemorrhage, cord expansion, or
cord volume loss.

Posterior Fossa, vertebral arteries, paraspinal tissues: Several
punctate foci of susceptibility are present within the right
inferior cerebellar hemisphere, probably hemosiderin deposition from
chronic microhemorrhage. Mild prevertebral edema from C1-T2.

Disc levels:

C2-3: No significant disc displacement, foraminal stenosis, or canal
stenosis. Mild facet hypertrophy.

C3-4: Small disc bulge with uncovertebral and facet hypertrophy
eccentric to the left. Mild left foraminal stenosis. Mild canal
stenosis.

C4-5: Disc osteophyte complex with bilateral uncovertebral and facet
hypertrophy. Moderate bilateral foraminal stenosis. Mild canal
stenosis. Disc contact on the anterior cord.

C5-6: Disc osteophyte complex with left-greater-than-right
uncovertebral and facet hypertrophy. Moderate right and severe left
foraminal stenosis. Moderate canal stenosis with mild cord
flattening.

C6-7: Disc osteophyte complex with right greater than left
uncovertebral and facet hypertrophy. Severe right and moderate left
foraminal stenosis. Moderate canal stenosis with mild cord
flattening.

C7-T1: Small foraminal endplate marginal osteophytes and
left-greater-than-right facet hypertrophy. Moderate left foraminal
stenosis. No canal stenosis.
IMPRESSION: 1. Mild prevertebral edema, probably related to muscle strain and/or
soft tissue injury. No evidence for spinal fracture or ligamentous
injury. No malalignment.
2. Increase central cord signal at C4 level may represent a mild
cord contusion. Differential includes chronic sequelae of
compressive myelopathy or prior infectious/inflammatory process.
3. Cervical spondylosis greatest at the C5-6 and C6-7 levels.
4. Moderate C5-6 and C6-7 canal stenosis. No high-grade canal
stenosis.
5. Severe left C5-6 and severe right C6-7 foraminal stenosis.
Multilevel mild and moderate foraminal stenosis.

These results will be called to the ordering clinician or
representative by the Radiologist Assistant, and communication
documented in the PACS or zVision Dashboard.

By: Orkiestra Lonc M.D.

## 2019-11-26 IMAGING — CT CT CERVICAL SPINE W/O CM
4 of 8 series · 11 of 33 positions shown, 12 images · non-contrast
Comparison: None.

CLINICAL DATA: Status post fall in the bathtub today with a blow to
the head. Neck pain. Initial encounter.

EXAM:
CT HEAD WITHOUT CONTRAST
CT CERVICAL SPINE WITHOUT CONTRAST
TECHNIQUE: Multidetector CT imaging of the head and cervical spine was
performed following the standard protocol without intravenous
contrast. Multiplanar CT image reconstructions of the cervical spine
were also generated.

[Series 4: axial bone · axial · 0.21mm/px · z∈[-276,-162]mm · 4 of 96 slices shown, 5 images]
[im 20/96  soft-tissue]
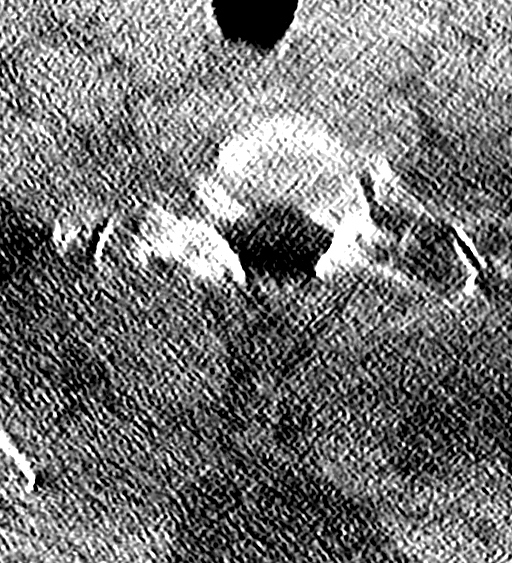
[im 20/96  bone]
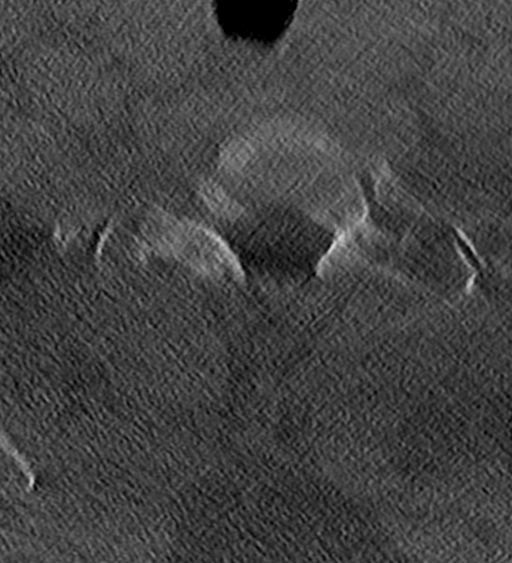
[im 39/96  bone]
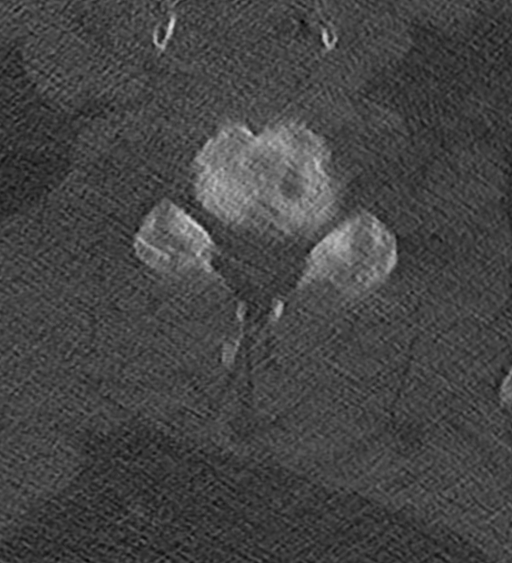
[im 58/96  bone]
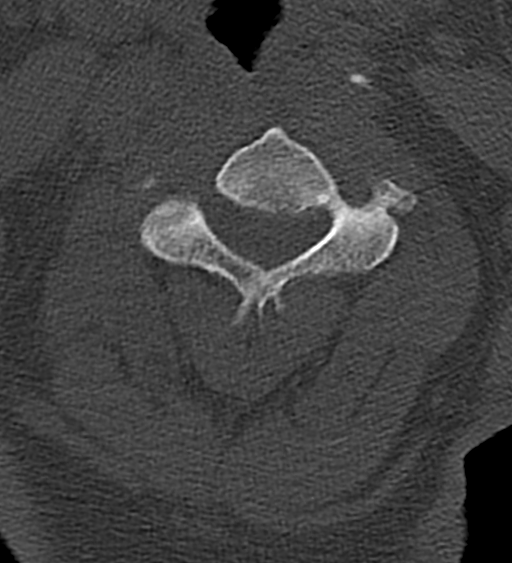
[im 77/96  bone]
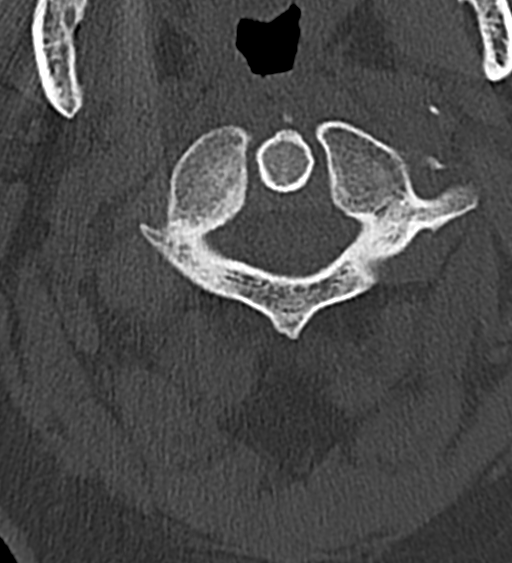

[Series 10: c_spine 2.0 st · axial · 0.28mm/px · z∈[-268,-148]mm · 4 of 100 slices shown]
[im 20/100  bone]
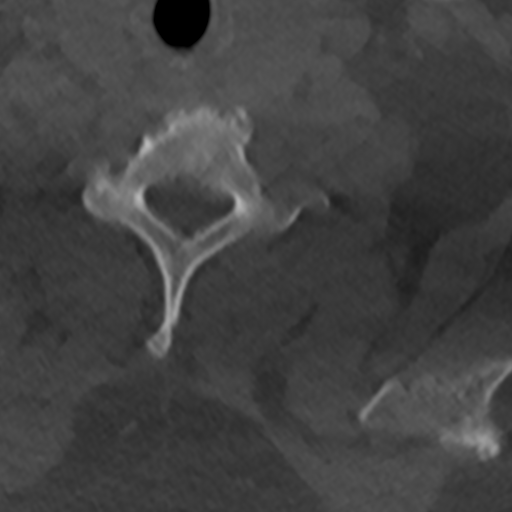
[im 40/100  bone]
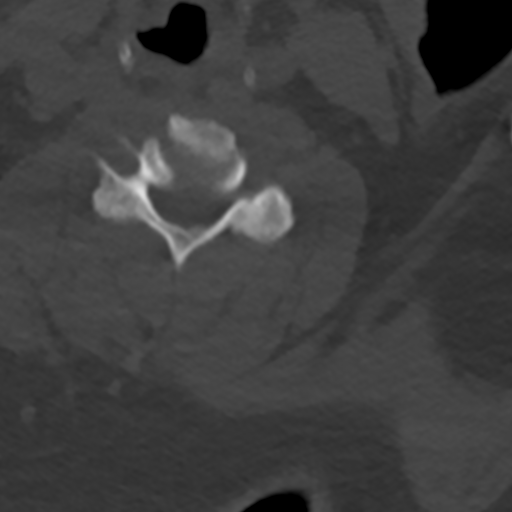
[im 60/100  bone]
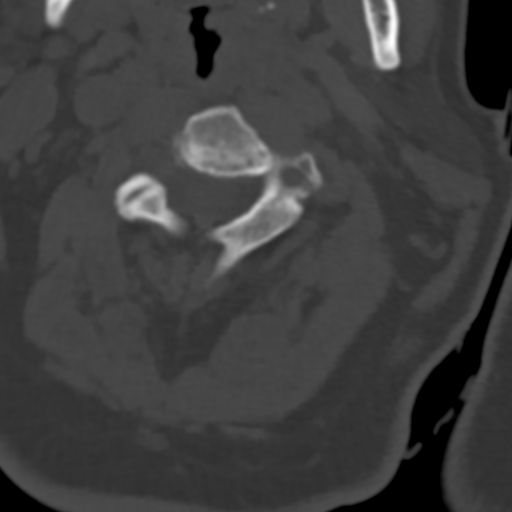
[im 80/100  bone]
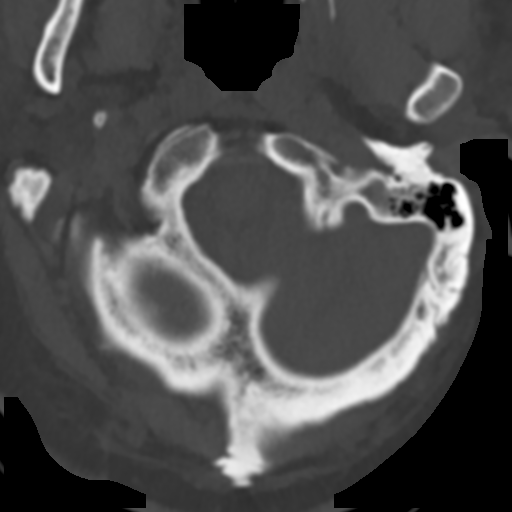

[Series 11: coronal bone · coronal · 0.23mm/px · 1 of 61 slices shown]
[im 31/61  bone]
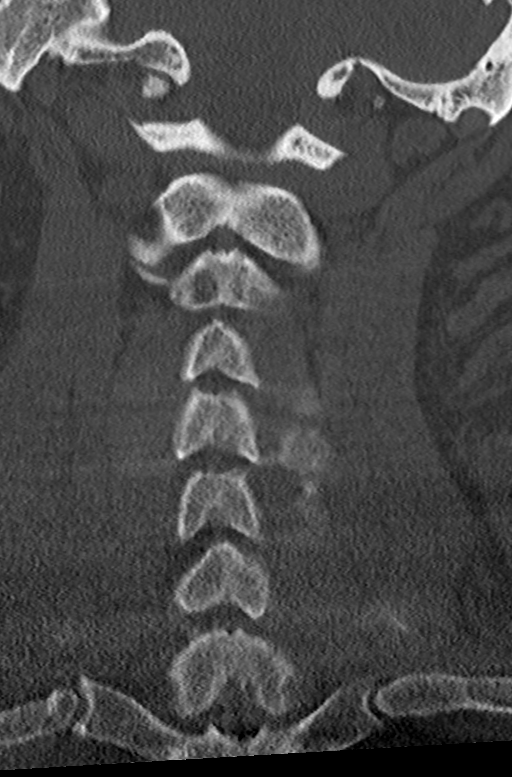

[Series 12: sagittal bone · sagittal · 0.21mm/px · 2 of 61 slices shown]
[im 21/61  bone]
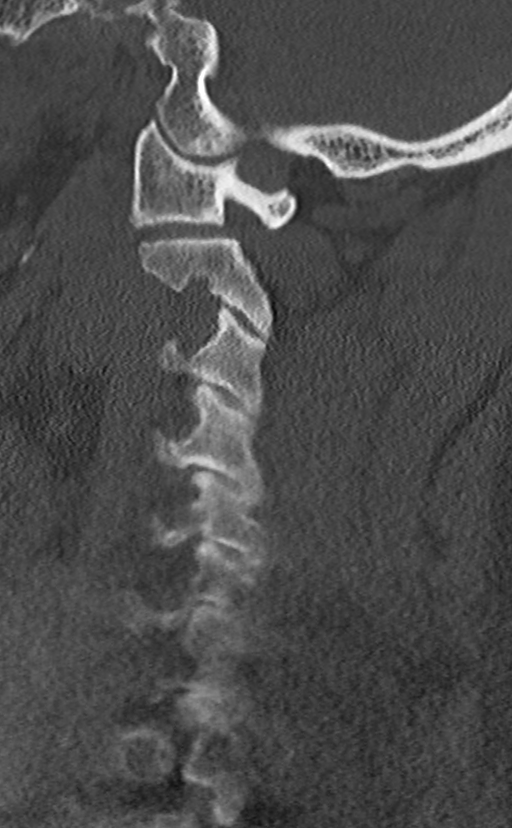
[im 41/61  bone]
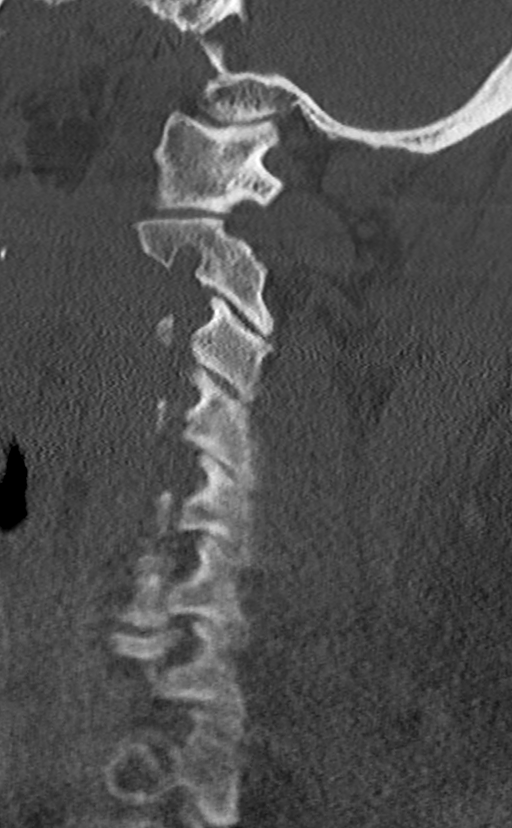

[11 of 33 positions shown; findings below may reference images not displayed]

FINDINGS: CT HEAD FINDINGS

Brain: No evidence of acute infarction, hemorrhage, hydrocephalus,
extra-axial collection or mass lesion/mass effect.

Vascular: No hyperdense vessel or unexpected calcification.

Skull: Intact.

Sinuses/Orbits: Clear.

Other: Soft tissue contusion anterior forehead noted.

CT CERVICAL SPINE FINDINGS

Alignment: Maintained.  Straightening of lordosis noted.

Skull base and vertebrae: No acute fracture. No primary bone lesion
or focal pathologic process.

Soft tissues and spinal canal: No prevertebral fluid or swelling. No
visible canal hematoma.

Disc levels: Loss of disc space height and endplate spurring are
seen at C5-6, C6-7 and to a lesser degree, C7-T1.

Upper chest: Lung apices clear.

Other: None.
IMPRESSION: Soft tissue contusion anterior to the frontal bone without
underlying fracture. No acute intracranial abnormality.

No acute abnormality cervical spine.

Degenerative disc disease most notable at C5-6 and C6-7.

## 2019-11-28 ENCOUNTER — Encounter: Payer: Self-pay | Admitting: Family Medicine

## 2019-11-29 ENCOUNTER — Other Ambulatory Visit: Payer: Self-pay

## 2019-11-29 ENCOUNTER — Ambulatory Visit: Payer: Medicare PPO | Admitting: Physician Assistant

## 2019-11-29 ENCOUNTER — Encounter: Payer: Self-pay | Admitting: Physician Assistant

## 2019-11-29 ENCOUNTER — Other Ambulatory Visit (INDEPENDENT_AMBULATORY_CARE_PROVIDER_SITE_OTHER): Payer: Medicare PPO

## 2019-11-29 VITALS — BP 130/70 | HR 80 | Temp 98.0°F | Ht 72.0 in | Wt 375.0 lb

## 2019-11-29 DIAGNOSIS — H6123 Impacted cerumen, bilateral: Secondary | ICD-10-CM

## 2019-11-29 DIAGNOSIS — E119 Type 2 diabetes mellitus without complications: Secondary | ICD-10-CM | POA: Diagnosis not present

## 2019-11-29 DIAGNOSIS — H9313 Tinnitus, bilateral: Secondary | ICD-10-CM

## 2019-11-29 LAB — CBC
HCT: 40.4 % (ref 39.0–52.0)
Hemoglobin: 13.3 g/dL (ref 13.0–17.0)
MCHC: 33 g/dL (ref 30.0–36.0)
MCV: 90.9 fl (ref 78.0–100.0)
Platelets: 212 10*3/uL (ref 150.0–400.0)
RBC: 4.44 Mil/uL (ref 4.22–5.81)
RDW: 15.2 % (ref 11.5–15.5)
WBC: 7.7 10*3/uL (ref 4.0–10.5)

## 2019-11-29 LAB — COMPREHENSIVE METABOLIC PANEL
ALT: 15 U/L (ref 0–53)
AST: 17 U/L (ref 0–37)
Albumin: 3.5 g/dL (ref 3.5–5.2)
Alkaline Phosphatase: 102 U/L (ref 39–117)
BUN: 17 mg/dL (ref 6–23)
CO2: 29 mEq/L (ref 19–32)
Calcium: 8.7 mg/dL (ref 8.4–10.5)
Chloride: 102 mEq/L (ref 96–112)
Creatinine, Ser: 1.17 mg/dL (ref 0.40–1.50)
GFR: 74.48 mL/min (ref >60.00)
Glucose, Bld: 151 mg/dL — ABNORMAL HIGH (ref 70–99)
Potassium: 4.8 mEq/L (ref 3.5–5.1)
Sodium: 138 mEq/L (ref 135–145)
Total Bilirubin: 0.8 mg/dL (ref 0.2–1.2)
Total Protein: 6.9 g/dL (ref 6.0–8.3)

## 2019-11-29 LAB — LIPID PANEL
Cholesterol: 110 mg/dL (ref 0–200)
HDL: 33.3 mg/dL — ABNORMAL LOW (ref >39.00)
LDL Cholesterol: 61 mg/dL (ref 0–99)
NonHDL: 76.65
Total CHOL/HDL Ratio: 3
Triglycerides: 80 mg/dL (ref 0.0–149.0)
VLDL: 16 mg/dL (ref 0.0–40.0)

## 2019-11-29 LAB — HEMOGLOBIN A1C: Hgb A1c MFr Bld: 8.2 % — ABNORMAL HIGH (ref 4.6–6.5)

## 2019-11-29 NOTE — Progress Notes (Addendum)
Franklin Hicks is a 69 y.o. male here for a new problem.  I acted as a Education administrator for [employer], PA-C Anselmo Pickler, LPN  History of Present Illness:   Chief Complaint  Patient presents with  . Tinnitus  . Cerumen Impaction    HPI   Tinnitus Pt c/o ringing in ears x 3 weeks, ears feel clogged R>L. Denies pain, lightheadedness, dizziness, vision/hearing changes. Does have history of cerumen impaction.    Past Medical History:  Diagnosis Date  . Arthritis    "knees" (12/17/2017)  . CAD S/P percutaneous coronary angioplasty 09/20/2016   Nstemi 08/2016. Dr. Doylene Canard. DES- brilinta and asa. Requests change to [facility] cardiology; 95% Ramus -> PCI Resolute Onyx DES 2.75 x 18  . Central cord syndrome ([place]) 12/17/2017  . Chronic combined systolic and diastolic heart failure ([place]) 10/09/2016   EF 45% and grade II diastolic after nstemi  . Diet-controlled diabetes mellitus ([place])   . DJD (degenerative joint disease)   . Gout   . High cholesterol   . Hypertension   . NSTEMI (non-ST elevated myocardial infarction) ([place]) 09/20/2016   95% Ramus - > PCI   . Obesity   . Recurrent pulmonary embolism ([place]) 11/'14; 4/'19   a) Bilateral segmental and subsegmental pulmonary emboli with mild RV Strain.; b) after fall with C-spine Fxr --> Acute PE of right main pulmonary artery extending into multiple segments.     Social History   Socioeconomic History  . Marital status: Married    Spouse name: Not on file  . Number of children: Not on file  . Years of education: Not on file  . Highest education level: Not on file  Occupational History  . Occupation: Pharmacist, hospital  Tobacco Use  . Smoking status: Former Smoker    Packs/day: 1.00    Years: 13.00    Pack years: 13.00    Types: Cigarettes    Quit date: 07/13/1977    Years since quitting: 42.4  . Smokeless tobacco: Never Used  Substance and Sexual Activity  . Alcohol use: Yes    Alcohol/week: 0.0 standard drinks    Comment: holidays only  . Drug  use: No  . Sexual activity: Not on file  Other Topics Concern  . Not on file  Social History Narrative   Married for 44 years. He has 3 children and 5 grandchildren. They also 5 great grandchildren. He lives with his wife. He currently works as an Tourist information centre manager for [employer]   Occasional beer or wine   No smoking history or illicit drug use.   One hour water aerobics sessions 3 days a week. Has not started back yet read from recent illness sounds.   Social Determinants of Health   Financial Resource Strain:   . Difficulty of Paying Living Expenses:   Food Insecurity:   . Worried About Charity fundraiser in the Last Year:   . Arboriculturist in the Last Year:   Transportation Needs:   . Film/video editor (Medical):   Marland Kitchen Lack of Transportation (Non-Medical):   Physical Activity:   . Days of Exercise per Week:   . Minutes of Exercise per Session:   Stress:   . Feeling of Stress :   Social Connections:   . Frequency of Communication with Friends and Family:   . Frequency of Social Gatherings with Friends and Family:   . Attends Religious Services:   . Active Member of Clubs or  Organizations:   . Attends Banker Meetings:   Marland Kitchen Marital Status:   Intimate Partner Violence:   . Fear of Current or Ex-Partner:   . Emotionally Abused:   Marland Kitchen Physically Abused:   . Sexually Abused:     Past Surgical History:  Procedure Laterality Date  . CARDIAC CATHETERIZATION N/A 09/19/2016   Procedure: Left Heart Cath and Coronary Angiography;  Surgeon: Orpah Cobb, MD;  Location: [facility] INVASIVE CV LAB;  Service: Cardiovascular: 95% proximal Ramus Intermedius --> PCI  . CARDIAC CATHETERIZATION N/A 09/19/2016   Procedure: Coronary Stent Intervention;  Surgeon: Yvonne Kendall, MD;  Location: [facility] INVASIVE CV LAB;  Service: Cardiovascular: 95% ramus intermedius;  Resolute Onyx 2.75 x 18 mm drug-eluting stent  . CYSTOSCOPY/RETROGRADE/URETEROSCOPY/STONE  EXTRACTION WITH BASKET  8502,7741   ureteral stone   . MENISCUS REPAIR Left    knee open meniscetomy  . TRANSTHORACIC ECHOCARDIOGRAM  2004   no lvh nl ejection fraction  . TRANSTHORACIC ECHOCARDIOGRAM  09/19/2016   In setting of NSTEMI:  EF 45-50% with diffuse hypokinesis. GR 2 DD. Mild biatrial enlargement.    Family History  Problem Relation Age of Onset  . Liver disease Mother     Allergies  Allergen Reactions  . Ace Inhibitors     Angioedema  . Other Hives and Itching    msg    Current Medications:   Current Outpatient Medications:  .  acetaminophen (TYLENOL) 325 MG tablet, Take 1-2 tablets (325-650 mg total) by mouth every 4 (four) hours as needed for mild pain., Disp: , Rfl:  .  aspirin EC 81 MG tablet, Take 1 tablet (81 mg total) by mouth daily., Disp: 90 tablet, Rfl: 3 .  atorvastatin (LIPITOR) 80 MG tablet, TAKE 1 TABLET (80 MG TOTAL) BY MOUTH DAILY AT 6 PM., Disp: 90 tablet, Rfl: 2 .  diclofenac sodium (VOLTAREN) 1 % GEL, Apply 1 application topically 3 (three) times daily. Both knees, Disp: 3 Tube, Rfl: 4 .  furosemide (LASIX) 20 MG tablet, TAKE 1 TABLET BY MOUTH EVERY MONDAY, WEDNESDAY, AND FRIDAY. NEED OFFICE VISIT FOR MORE REFILLS, Disp: 45 tablet, Rfl: 0 .  metoprolol tartrate (LOPRESSOR) 50 MG tablet, TAKE 1 TABLET BY MOUTH TWICE A DAY, Disp: 60 tablet, Rfl: 10 .  Multiple Vitamin (MULTIVITAMIN WITH MINERALS) TABS tablet, Take 1 tablet by mouth daily., Disp: , Rfl:  .  Potassium Citrate 15 MEQ (1620 MG) TBCR, Take 1 tablet by mouth 2 (two) times daily., Disp: , Rfl: 11 .  terazosin (HYTRIN) 10 MG capsule, TAKE 1 CAPSULE (10 MG TOTAL) BY MOUTH AT BEDTIME., Disp: 90 capsule, Rfl: 1 .  vitamin B-12 (CYANOCOBALAMIN) 100 MCG tablet, Take 1 tablet (100 mcg total) by mouth daily., Disp: , Rfl:  .  XARELTO 20 MG TABS tablet, TAKE 1 TABLET (20 MG TOTAL) BY MOUTH DAILY WITH SUPPER., Disp: 90 tablet, Rfl: 2   Review of Systems:   ROS  Negative unless otherwise specified  per HPI.   Vitals:   Vitals:   11/29/19 1405  BP: 130/70  Pulse: 80  Temp: 98 F (36.7 C)  TempSrc: Temporal  SpO2: 94%  Weight: (!) 375 lb (170.1 kg)  Height: 6' (1.829 m)     Body mass index is 50.86 kg/m.  Physical Exam:   Physical Exam Vitals and nursing note reviewed.  Constitutional:      General: He is not in acute distress.    Appearance: He is well-developed. He is not ill-appearing or toxic-appearing.  HENT:     Head: Normocephalic and atraumatic.     Right Ear: Ear canal and external ear normal. There is impacted cerumen.     Left Ear: Ear canal and external ear normal. There is impacted cerumen.  Eyes:     General: Lids are normal.     Conjunctiva/sclera: Conjunctivae normal.  Neck:     Trachea: Trachea normal.  Cardiovascular:     Rate and Rhythm: Normal rate and regular rhythm.     Heart sounds: Normal heart sounds, S1 normal and S2 normal.  Pulmonary:     Effort: Pulmonary effort is normal.     Breath sounds: Normal breath sounds. No decreased breath sounds, wheezing, rhonchi or rales.  Lymphadenopathy:     Cervical: No cervical adenopathy.  Skin:    General: Skin is warm and dry.  Neurological:     Mental Status: He is alert.  Psychiatric:        Speech: Speech normal.        Behavior: Behavior normal. Behavior is cooperative.    Procedure: Cerumen Disimpaction Warm water was applied and gentle ear lavage performed on the bilateral. There were no complications and following the disimpaction the tympanic membrane were visible on the bilateral. Tympanic membranes are intact following the procedure.  Auditory canals are normal.  The patient reported relief of symptoms after removal of cerumen.     Assessment and Plan:   Spyridon was seen today for tinnitus and cerumen impaction.  Diagnoses and all orders for this visit:  Bilateral impacted cerumen -     Ear wax removal  Tinnitus of both ears   Patient tolerated procedure well and reported  almost full resolution of symptoms. Offered ENT referral for residual ringing in ears, but we decided to wait and give it a few days after this cleaning to see if it improves. I asked patient to send me a MyChart message if he would like ENT referral.  Reviewed expectations re: course of current medical issues. Discussed self-management of symptoms. Outlined signs and symptoms indicating need for more acute intervention. Patient verbalized understanding and all questions were answered. See orders for this visit as documented in the electronic medical record. Patient received an After-Visit Summary.  CMA or LPN served as scribe during this visit. History, Physical, and Plan performed by medical provider. The above documentation has been reviewed and is accurate and complete.   Jarold Motto, PA-C

## 2019-11-29 NOTE — Patient Instructions (Signed)
It was great to see you!  Send me a MyChart message if you'd like to go to the Ear Nose and Throat doctor.  Take care,  Jarold Motto PA-C

## 2019-11-30 ENCOUNTER — Encounter: Payer: Self-pay | Admitting: Family Medicine

## 2019-11-30 ENCOUNTER — Telehealth: Payer: Self-pay

## 2019-11-30 NOTE — Telephone Encounter (Signed)
I don't see where any new medication was prescribed for patient, per last OV note/medication list. Please advise. Thanks

## 2019-11-30 NOTE — Telephone Encounter (Signed)
Patient states that the wheelchair that he currently have is falling apart. Patient would like for Dr. Durene Cal to send over a prescription to [facility] so that he can receive a new wheelchair.

## 2019-12-01 ENCOUNTER — Other Ambulatory Visit: Payer: Self-pay

## 2019-12-01 MED ORDER — METFORMIN HCL 500 MG PO TABS: 500.0000 mg | ORAL_TABLET | Freq: Two times a day (BID) | ORAL | 3 refills | Status: DC

## 2019-12-01 NOTE — Telephone Encounter (Signed)
Yes thanks 

## 2019-12-01 NOTE — Telephone Encounter (Signed)
Ok to give new orders?

## 2019-12-02 ENCOUNTER — Other Ambulatory Visit: Payer: Self-pay

## 2019-12-02 DIAGNOSIS — G9520 Unspecified cord compression: Secondary | ICD-10-CM

## 2019-12-02 NOTE — Telephone Encounter (Signed)
Thanks so much community message has been sent

## 2019-12-02 NOTE — Telephone Encounter (Signed)
I have only done Home Health referrals - I usually send a community message to [facility] and [facility] - they can let you know where to fax it.

## 2019-12-02 NOTE — Telephone Encounter (Signed)
Hey friend I have put in order do you have ppw for [facility] or know how to send to them?

## 2019-12-22 ENCOUNTER — Other Ambulatory Visit: Payer: Self-pay

## 2019-12-22 ENCOUNTER — Ambulatory Visit (INDEPENDENT_AMBULATORY_CARE_PROVIDER_SITE_OTHER): Payer: Medicare PPO

## 2019-12-22 DIAGNOSIS — Z0000 Encounter for general adult medical examination without abnormal findings: Secondary | ICD-10-CM

## 2019-12-22 NOTE — Progress Notes (Signed)
This visit is being conducted via phone call due to the COVID-19 pandemic. This patient has given me verbal consent via phone to conduct this visit, patient states they are participating from their home address. Some vital signs may be absent or patient reported.   Patient identification: identified by name, DOB, and current address.  Location provider: [facility], Office Persons participating in the virtual visit: Kandis Fantasia LPN, patient, and Dr.     Deno Etienne:   Franklin Hicks is a 69 y.o. male who presents for Medicare Annual/Subsequent preventive examination.  Review of Systems:   Cardiac Risk Factors include: advanced age (>87men, >96 women);male gender;hypertension;dyslipidemia;diabetes mellitus    Objective:    Vitals: There were no vitals taken for this visit.  There is no height or weight on file to calculate BMI.  Advanced Directives 12/22/2019 07/01/2018 06/24/2018 06/07/2018 12/17/2017 12/13/2017 12/16/2016  Does Patient Have a Medical Advance Directive? No No No No No No No  Would patient like information on creating a medical advance directive? Yes (MAU/Ambulatory/Procedural Areas - Information given) - No - Patient declined No - Patient declined No - Patient declined No - Patient declined No - Patient declined  Pre-existing out of facility DNR order (yellow form or pink MOST form) - - - - - - -    Tobacco Social History   Tobacco Use  Smoking Status Former Smoker  . Packs/day: 1.00  . Years: 13.00  . Pack years: 13.00  . Types: Cigarettes  . Quit date: 07/13/1977  . Years since quitting: 42.4  Smokeless Tobacco Never Used     Counseling given: Not Answered   Clinical Intake:  Pre-visit preparation completed: Yes  Pain : No/denies pain  How often do you need to have someone help you when you read instructions, pamphlets, or other written materials from your doctor or pharmacy?: 1 - Never  Interpreter Needed?: No  Information entered by :: Kandis Fantasia LPN  Past Medical History:  Diagnosis Date  . Arthritis    "knees" (12/17/2017)  . CAD S/P percutaneous coronary angioplasty 09/20/2016   Nstemi 08/2016. Dr. Algie Coffer. DES- brilinta and asa. Requests change to [facility] cardiology; 95% Ramus -> PCI Resolute Onyx DES 2.75 x 18  . Central cord syndrome ([facility]) 12/17/2017  . Chronic combined systolic and diastolic heart failure ([facility]) 10/09/2016   EF 45% and grade II diastolic after nstemi  . Diet-controlled diabetes mellitus ([facility])   . DJD (degenerative joint disease)   . Gout   . High cholesterol   . Hypertension   . NSTEMI (non-ST elevated myocardial infarction) ([facility]) 09/20/2016   95% Ramus - > PCI   . Obesity   . Recurrent pulmonary embolism ([facility]) 11/'14; 4/'19   a) Bilateral segmental and subsegmental pulmonary emboli with mild RV Strain.; b) after fall with C-spine Fxr --> Acute PE of right main pulmonary artery extending into multiple segments.   Past Surgical History:  Procedure Laterality Date  . CARDIAC CATHETERIZATION N/A 09/19/2016   Procedure: Left Heart Cath and Coronary Angiography;  Surgeon: Orpah Cobb, MD;  Location: [facility] INVASIVE CV LAB;  Service: Cardiovascular: 95% proximal Ramus Intermedius --> PCI  . CARDIAC CATHETERIZATION N/A 09/19/2016   Procedure: Coronary Stent Intervention;  Surgeon: Yvonne Kendall, MD;  Location: [facility] INVASIVE CV LAB;  Service: Cardiovascular: 95% ramus intermedius;  Resolute Onyx 2.75 x 18 mm drug-eluting stent  . CYSTOSCOPY/RETROGRADE/URETEROSCOPY/STONE EXTRACTION WITH BASKET  [id number]   ureteral stone   . MENISCUS REPAIR Left  knee open meniscetomy  . TRANSTHORACIC ECHOCARDIOGRAM  2004   no lvh nl ejection fraction  . TRANSTHORACIC ECHOCARDIOGRAM  09/19/2016   In setting of NSTEMI:  EF 45-50% with diffuse hypokinesis. GR 2 DD. Mild biatrial enlargement.   Family History  Problem Relation Age of Onset  . Liver disease Mother    Social History   Socioeconomic History  . Marital status:  Married    Spouse name: Not on file  . Number of children: Not on file  . Years of education: Not on file  . Highest education level: Not on file  Occupational History  . Occupation: Runner, broadcasting/film/video  Tobacco Use  . Smoking status: Former Smoker    Packs/day: 1.00    Years: 13.00    Pack years: 13.00    Types: Cigarettes    Quit date: 07/13/1977    Years since quitting: 42.4  . Smokeless tobacco: Never Used  Substance and Sexual Activity  . Alcohol use: Yes    Alcohol/week: 0.0 standard drinks    Comment: holidays only  . Drug use: No  . Sexual activity: Not on file  Other Topics Concern  . Not on file  Social History Narrative   Married for 44 years. He has 3 children and 5 grandchildren. They also 5 great grandchildren. He lives with his wife. 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 -> [employer] education   Occasional beer or wine   No smoking history or illicit drug use.   One hour water aerobics sessions 3 days a week. Has not started back yet read from recent illness sounds.   Social Determinants of Health   Financial Resource Strain:   . Difficulty of Paying Living Expenses:   Food Insecurity:   . Worried About Programme researcher, broadcasting/film/video in the Last Year:   . Barista in the Last Year:   Transportation Needs:   . Freight forwarder (Medical):   Marland Kitchen Lack of Transportation (Non-Medical):   Physical Activity:   . Days of Exercise per Week:   . Minutes of Exercise per Session:   Stress:   . Feeling of Stress :   Social Connections:   . Frequency of Communication with Friends and Family:   . Frequency of Social Gatherings with Friends and Family:   . Attends Religious Services:   . Active Member of Clubs or Organizations:   . Attends Banker Meetings:   Marland Kitchen Marital Status:     Outpatient Encounter Medications as of 12/22/2019  Medication Sig  . acetaminophen (TYLENOL) 325 MG tablet Take 1-2 tablets (325-650 mg total) by mouth every 4  (four) hours as needed for mild pain.  Marland Kitchen aspirin EC 81 MG tablet Take 1 tablet (81 mg total) by mouth daily.  Marland Kitchen atorvastatin (LIPITOR) 80 MG tablet TAKE 1 TABLET (80 MG TOTAL) BY MOUTH DAILY AT 6 PM.  . diclofenac sodium (VOLTAREN) 1 % GEL Apply 1 application topically 3 (three) times daily. Both knees  . furosemide (LASIX) 20 MG tablet TAKE 1 TABLET BY MOUTH EVERY MONDAY, WEDNESDAY, AND FRIDAY. NEED OFFICE VISIT FOR MORE REFILLS  . metFORMIN (GLUCOPHAGE) 500 MG tablet Take 1 tablet (500 mg total) by mouth 2 (two) times daily with a meal.  . metoprolol tartrate (LOPRESSOR) 50 MG tablet TAKE 1 TABLET BY MOUTH TWICE A DAY  . Multiple Vitamin (MULTIVITAMIN WITH MINERALS) TABS tablet Take 1 tablet by mouth daily.  . Potassium Citrate 15 MEQ (1620  MG) TBCR Take 1 tablet by mouth 2 (two) times daily.  Marland Kitchen terazosin (HYTRIN) 10 MG capsule TAKE 1 CAPSULE (10 MG TOTAL) BY MOUTH AT BEDTIME.  . vitamin B-12 (CYANOCOBALAMIN) 100 MCG tablet Take 1 tablet (100 mcg total) by mouth daily.  Alveda Reasons 20 MG TABS tablet TAKE 1 TABLET (20 MG TOTAL) BY MOUTH DAILY WITH SUPPER.   No facility-administered encounter medications on file as of 12/22/2019.    Activities of Daily Living In your present state of health, do you have any difficulty performing the following activities: 12/22/2019  Hearing? N  Vision? N  Difficulty concentrating or making decisions? N  Walking or climbing stairs? Y  Dressing or bathing? Y  Doing errands, shopping? Y  Preparing Food and eating ? N  Using the Toilet? N  In the past six months, have you accidently leaked urine? N  Do you have problems with loss of bowel control? N  Managing your Medications? N  Managing your Finances? N  Housekeeping or managing your Housekeeping? N  Some recent data might be hidden    Patient Care Team: Marin Olp, MD as PCP - General (Family Medicine) Hali Marry. Mitzi Hansen, MD (Cardiology) Rana Snare, MD as Attending Physician (Urology) Meredith Staggers, MD as Consulting Physician (Physical Medicine and Rehabilitation) Leonie Man, MD as Consulting Physician (Cardiology) Opthamology, [facility] as Consulting Physician (Ophthalmology)   Assessment:   This is a routine wellness examination for [place].  Exercise Activities and Dietary recommendations Current Exercise Habits: Home exercise routine, Type of exercise: stretching;walking, Time (Minutes): 30, Frequency (Times/Week): 4, Weekly Exercise (Minutes/Week): 120, Intensity: Mild  Goals    . Patient Stated     Goal is to be independent again        Fall Risk Fall Risk  12/22/2019 07/13/2019 03/15/2019 10/13/2018 07/13/2018  Falls in the past year? 0 0 0 0 1  Comment - - - - no recent fall  Number falls in past yr: 0 - - - 1  Injury with Fall? 0 - - - -  Risk for fall due to : Impaired balance/gait;Impaired mobility;History of fall(s) - - - Impaired mobility;History of fall(s)  Follow up Education provided;Falls prevention discussed;Falls evaluation completed - - - -   Is the patient's home free of loose throw rugs in walkways, pet beds, electrical cords, etc?   yes      Grab bars in the bathroom? yes      Handrails on the stairs?   yes      Adequate lighting?   yes  Depression Screen PHQ 2/9 Scores 12/22/2019 01/13/2019 07/13/2018 07/01/2018  PHQ - 2 Score 0 0 0 0    Cognitive Function- no cognitive concerns at this time  MMSE - Mini Mental State Exam 07/01/2018  Not completed: (No Data)     6CIT Screen 12/22/2019  What Year? 0 points  What month? 0 points  What time? 0 points  Count back from 20 0 points  Months in reverse 0 points  Repeat phrase 0 points  Total Score 0    Immunization History  Administered Date(s) Administered  . Influenza Split 09/15/2011, 07/15/2012  . Influenza Whole 06/25/1998, 05/31/2007, 07/17/2010  . Influenza, High Dose Seasonal PF 09/24/2017  . Influenza,inj,Quad PF,6+ Mos 07/14/2013, 07/30/2015  . Td 08/26/1999, 12/08/2008     Qualifies for Shingles Vaccine? Discussed and patient will check with pharmacy for coverage.  Patient education handout provided   Screening Tests Health Maintenance  Topic  Date Due  . COVID-19 Vaccine (1) Never done  . FOOT EXAM  09/24/2018  . TETANUS/TDAP  12/09/2018  . PNA vac Low Risk Adult (1 of 2 - PCV13) 12/26/2019 (Originally 06/27/2014)  . COLONOSCOPY  12/22/2023 (Originally 06/28/1999)  . INFLUENZA VACCINE  03/25/2020  . HEMOGLOBIN A1C  05/30/2020  . OPHTHALMOLOGY EXAM  09/19/2020  . Hepatitis C Screening  Completed   Cancer Screenings: Lung: Low Dose CT Chest recommended if Age 71-80 years, 30 pack-year currently smoking OR have quit w/in 15years. Patient does not qualify. Colorectal: will consider completing Cologuard; new order will need to be placed      Plan:  I have personally reviewed and addressed the Medicare Annual Wellness questionnaire and have noted the following in the patient's chart:  A. Medical and social history B. Use of alcohol, tobacco or illicit drugs  C. Current medications and supplements D. Functional ability and status E.  Nutritional status F.  Physical activity G. Advance directives H. List of other physicians I.  Hospitalizations, surgeries, and ER visits in previous 12 months J.  Vitals K. Screenings such as hearing and vision if needed, cognitive and depression L. Referrals, records requested, and appointments- none   In addition, I have reviewed and discussed with patient certain preventive protocols, quality metrics, and best practice recommendations. A written personalized care plan for preventive services as well as general preventive health recommendations were provided to patient.   Signed,  Kandis Fantasia, LPN  Nurse Health Advisor   Nurse Notes: Discussed with patient at length diabetic medication and control.  He still would like to work on diet and exercise prior to starting medication.  He is also wondering if statin has  something to do with elevated blood sugar.  He is working diligently to change eating habits.   Patient education information provided. He will discuss further with pcp at upcoming appointment

## 2019-12-22 NOTE — Patient Instructions (Signed)
[name] , Thank you for taking time to come for your Medicare Wellness Visit. I appreciate your ongoing commitment to your health goals. Please review the following plan we discussed and let me know if I can assist you in the future.   Screening recommendations/referrals: Colorectal Screening: please let us know when you would like to proceed with Cologuard   Vision and Dental Exams: Recommended annual ophthalmology exams for early detection of glaucoma and other disorders of the eye Recommended annual dental exams for proper oral hygiene  Diabetic Exams: Diabetic Eye Exam: recommended yearly; up to date  Diabetic Foot Exam: recommended yearly; at next office visit   Vaccinations: Influenza vaccine: recommended each fall  Pneumococcal vaccine: recommended starting with Prevnar 13, followed by Pneumovax 23 one year later  Tdap vaccine: recommended every 10 years; Please call your insurance company to determine your out of pocket expense. You also receive this vaccine at your local pharmacy or Health Dept. Shingles vaccine: You may receive this vaccine at your local pharmacy. (see handout)  Covid vaccine:  Please consider (you can schedule at [email or web address])   Advanced directives: I have provided a copy for you to complete at home and have notarized. Once this is complete please bring a copy in to our office so we can scan it into your chart.  Goals: Recommend to drink at least 6-8 8oz glasses of water per day and consume a balanced diet rich in fresh fruits and vegetables.   Next appointment: Please schedule your Annual Wellness Visit with your Nurse Health Advisor in one year.  Preventive Care 88 Years and Older, Male Preventive care refers to lifestyle choices and visits with your health care provider that can promote health and wellness. What does preventive care include?  A yearly physical exam. This is also called an annual well check.  Dental exams once or twice a  year.  Routine eye exams. Ask your health care provider how often you should have your eyes checked.  Personal lifestyle choices, including:  Daily care of your teeth and gums.  Regular physical activity.  Eating a healthy diet.  Avoiding tobacco and drug use.  Limiting alcohol use.  Practicing safe sex.  Taking low doses of aspirin every day if recommended by your health care provider..  Taking vitamin and mineral supplements as recommended by your health care provider. What happens during an annual well check? The services and screenings done by your health care provider during your annual well check will depend on your age, overall health, lifestyle risk factors, and family history of disease. Counseling  Your health care provider may ask you questions about your:  Alcohol use.  Tobacco use.  Drug use.  Emotional well-being.  Home and relationship well-being.  Sexual activity.  Eating habits.  History of falls.  Memory and ability to understand (cognition).  Work and work Astronomer. Screening  You may have the following tests or measurements:  Height, weight, and BMI.  Blood pressure.  Lipid and cholesterol levels. These may be checked every 5 years, or more frequently if you are over 17 years old.  Skin check.  Lung cancer screening. You may have this screening every year starting at age 17 if you have a 30-pack-year history of smoking and currently smoke or have quit within the past 15 years.  Fecal occult blood test (FOBT) of the stool. You may have this test every year starting at age 93.  Flexible sigmoidoscopy or colonoscopy. You may have a sigmoidoscopy  every 5 years or a colonoscopy every 10 years starting at age 1.  Prostate cancer screening. Recommendations will vary depending on your family history and other risks.  Hepatitis C blood test.  Hepatitis B blood test.  Sexually transmitted disease (STD) testing.  Diabetes screening.  This is done by checking your blood sugar (glucose) after you have not eaten for a while (fasting). You may have this done every 1-3 years.  Abdominal aortic aneurysm (AAA) screening. You may need this if you are a current or former smoker.  Osteoporosis. You may be screened starting at age 19 if you are at high risk. Talk with your health care provider about your test results, treatment options, and if necessary, the need for more tests. Vaccines  Your health care provider may recommend certain vaccines, such as:  Influenza vaccine. This is recommended every year.  Tetanus, diphtheria, and acellular pertussis (Tdap, Td) vaccine. You may need a Td booster every 10 years.  Zoster vaccine. You may need this after age 70.  Pneumococcal 13-valent conjugate (PCV13) vaccine. One dose is recommended after age 91.  Pneumococcal polysaccharide (PPSV23) vaccine. One dose is recommended after age 44. Talk to your health care provider about which screenings and vaccines you need and how often you need them. This information is not intended to replace advice given to you by your health care provider. Make sure you discuss any questions you have with your health care provider. Document Released: 09/07/2015 Document Revised: 04/30/2016 Document Reviewed: 06/12/2015 Elsevier Interactive Patient Education  2017 [employer].  Fall Prevention in the Home Falls can cause injuries. They can happen to people of all ages. There are many things you can do to make your home safe and to help prevent falls. What can I do on the outside of my home?  Regularly fix the edges of walkways and driveways and fix any cracks.  Remove anything that might make you trip as you walk through a door, such as a raised step or threshold.  Trim any bushes or trees on the path to your home.  Use bright outdoor lighting.  Clear any walking paths of anything that might make someone trip, such as rocks or tools.  Regularly  check to see if handrails are loose or broken. Make sure that both sides of any steps have handrails.  Any raised decks and porches should have guardrails on the edges.  Have any leaves, snow, or ice cleared regularly.  Use sand or salt on walking paths during winter.  Clean up any spills in your garage right away. This includes oil or grease spills. What can I do in the bathroom?  Use night lights.  Install grab bars by the toilet and in the tub and shower. Do not use towel bars as grab bars.  Use non-skid mats or decals in the tub or shower.  If you need to sit down in the shower, use a plastic, non-slip stool.  Keep the floor dry. Clean up any water that spills on the floor as soon as it happens.  Remove soap buildup in the tub or shower regularly.  Attach bath mats securely with double-sided non-slip rug tape.  Do not have throw rugs and other things on the floor that can make you trip. What can I do in the bedroom?  Use night lights.  Make sure that you have a light by your bed that is easy to reach.  Do not use any sheets or blankets that are too  big for your bed. They should not hang down onto the floor.  Have a firm chair that has side arms. You can use this for support while you get dressed.  Do not have throw rugs and other things on the floor that can make you trip. What can I do in the kitchen?  Clean up any spills right away.  Avoid walking on wet floors.  Keep items that you use a lot in easy-to-reach places.  If you need to reach something above you, use a strong step stool that has a grab bar.  Keep electrical cords out of the way.  Do not use floor polish or wax that makes floors slippery. If you must use wax, use non-skid floor wax.  Do not have throw rugs and other things on the floor that can make you trip. What can I do with my stairs?  Do not leave any items on the stairs.  Make sure that there are handrails on both sides of the stairs and  use them. Fix handrails that are broken or loose. Make sure that handrails are as long as the stairways.  Check any carpeting to make sure that it is firmly attached to the stairs. Fix any carpet that is loose or worn.  Avoid having throw rugs at the top or bottom of the stairs. If you do have throw rugs, attach them to the floor with carpet tape.  Make sure that you have a light switch at the top of the stairs and the bottom of the stairs. If you do not have them, ask someone to add them for you. What else can I do to help prevent falls?  Wear shoes that:  Do not have high heels.  Have rubber bottoms.  Are comfortable and fit you well.  Are closed at the toe. Do not wear sandals.  If you use a stepladder:  Make sure that it is fully opened. Do not climb a closed stepladder.  Make sure that both sides of the stepladder are locked into place.  Ask someone to hold it for you, if possible.  Clearly mark and make sure that you can see:  Any grab bars or handrails.  First and last steps.  Where the edge of each step is.  Use tools that help you move around (mobility aids) if they are needed. These include:  Canes.  Walkers.  Scooters.  Crutches.  Turn on the lights when you go into a dark area. Replace any light bulbs as soon as they burn out.  Set up your furniture so you have a clear path. Avoid moving your furniture around.  If any of your floors are uneven, fix them.  If there are any pets around you, be aware of where they are.  Review your medicines with your doctor. Some medicines can make you feel dizzy. This can increase your chance of falling. Ask your doctor what other things that you can do to help prevent falls. This information is not intended to replace advice given to you by your health care provider. Make sure you discuss any questions you have with your health care provider. Document Released: 06/07/2009 Document Revised: 01/17/2016 Document  Reviewed: 09/15/2014 Elsevier Interactive Patient Education  2017 [employer].

## 2019-12-29 ENCOUNTER — Other Ambulatory Visit: Payer: Self-pay | Admitting: Family Medicine

## 2019-12-30 DIAGNOSIS — G8250 Quadriplegia, unspecified: Secondary | ICD-10-CM | POA: Diagnosis not present

## 2019-12-30 DIAGNOSIS — S14104S Unspecified injury at C4 level of cervical spinal cord, sequela: Secondary | ICD-10-CM | POA: Diagnosis not present

## 2020-01-06 ENCOUNTER — Other Ambulatory Visit: Payer: Self-pay

## 2020-01-06 ENCOUNTER — Ambulatory Visit (INDEPENDENT_AMBULATORY_CARE_PROVIDER_SITE_OTHER): Payer: Medicare PPO | Admitting: Family Medicine

## 2020-01-06 ENCOUNTER — Encounter: Payer: Self-pay | Admitting: Family Medicine

## 2020-01-06 VITALS — BP 130/74 | HR 69 | Temp 99.0°F | Ht 72.0 in | Wt 396.4 lb

## 2020-01-06 DIAGNOSIS — Z0000 Encounter for general adult medical examination without abnormal findings: Secondary | ICD-10-CM

## 2020-01-06 DIAGNOSIS — I5042 Chronic combined systolic (congestive) and diastolic (congestive) heart failure: Secondary | ICD-10-CM

## 2020-01-06 DIAGNOSIS — E119 Type 2 diabetes mellitus without complications: Secondary | ICD-10-CM

## 2020-01-06 DIAGNOSIS — I2699 Other pulmonary embolism without acute cor pulmonale: Secondary | ICD-10-CM | POA: Diagnosis not present

## 2020-01-06 DIAGNOSIS — I152 Hypertension secondary to endocrine disorders: Secondary | ICD-10-CM

## 2020-01-06 DIAGNOSIS — I10 Essential (primary) hypertension: Secondary | ICD-10-CM

## 2020-01-06 DIAGNOSIS — I712 Thoracic aortic aneurysm, without rupture, unspecified: Secondary | ICD-10-CM

## 2020-01-06 DIAGNOSIS — L84 Corns and callosities: Secondary | ICD-10-CM

## 2020-01-06 DIAGNOSIS — Z1211 Encounter for screening for malignant neoplasm of colon: Secondary | ICD-10-CM | POA: Diagnosis not present

## 2020-01-06 DIAGNOSIS — I7121 Aneurysm of the ascending aorta, without rupture: Secondary | ICD-10-CM

## 2020-01-06 DIAGNOSIS — E1169 Type 2 diabetes mellitus with other specified complication: Secondary | ICD-10-CM | POA: Diagnosis not present

## 2020-01-06 DIAGNOSIS — E1159 Type 2 diabetes mellitus with other circulatory complications: Secondary | ICD-10-CM | POA: Diagnosis not present

## 2020-01-06 DIAGNOSIS — E785 Hyperlipidemia, unspecified: Secondary | ICD-10-CM

## 2020-01-06 DIAGNOSIS — G9520 Unspecified cord compression: Secondary | ICD-10-CM | POA: Diagnosis not present

## 2020-01-06 MED ORDER — RIVAROXABAN 20 MG PO TABS: ORAL_TABLET | ORAL | 3 refills | Status: DC

## 2020-01-06 MED ORDER — TERAZOSIN HCL 10 MG PO CAPS: 10.0000 mg | ORAL_CAPSULE | Freq: Every day | ORAL | 3 refills | Status: DC

## 2020-01-06 MED ORDER — ATORVASTATIN CALCIUM 80 MG PO TABS: 80.0000 mg | ORAL_TABLET | Freq: Every day | ORAL | 3 refills | Status: DC

## 2020-01-06 NOTE — Patient Instructions (Addendum)
Health Maintenance Due  Topic Date Due  . COVID-19 Vaccine (1)- please complete covid 19 vaccination Never done  . FOOT EXAM -  09/24/2018   Team please send in a meter for him that works with his insurance.  I would like for him to monitor blood sugar at least once a week to see if it is trending down as he continues to work on healthy eating/increasing mobility- goal 90 to 110 or at least under 120  Diabetes education-Get on youtube and access videos by Dr. Rayfield Citizen (an endocrinologist. Listen to diabetes education parts 1-5)  Team-we ordered Cologuard today-please make sure this gets processed for him.  For callus formation- We will call you within two weeks about your referral to podiatry. If you do not hear within 3 weeks, give [place] a call.   Please stop by lab before you go If you have mychart- we will send your results within 3 business days of [place] receiving them.  If you do not have mychart- we will call you about results within 5 business days of [place] receiving them.    Recommended follow up: Return in about 3 months (around 04/07/2020) for follow up- or sooner if needed.

## 2020-01-06 NOTE — Progress Notes (Signed)
Phone: [phone]    I,Donna Orphanos,acting as a scribe for Tana Conch, MD.,have documented all relevant documentation on the behalf of Tana Conch, MD,as directed by  Tana Conch, MD while in the presence of Tana Conch, MD.  Subjective:  Patient presents today for their annual physical. Chief complaint-noted.   See problem oriented charting- ROS- full  review of systems was completed and negative  except for: knee pain at times, warmth sensation into legs, occasional shoulder pain.   The following were reviewed and entered/updated in epic: Past Medical History:  Diagnosis Date  . Arthritis    "knees" (12/17/2017)  . CAD S/P percutaneous coronary angioplasty 09/20/2016   Nstemi 08/2016. Dr. Algie Coffer. DES- brilinta and asa. Requests change to [facility] cardiology; 95% Ramus -> PCI Resolute Onyx DES 2.75 x 18  . Central cord syndrome (HCC) 12/17/2017  . Chronic combined systolic and diastolic heart failure (HCC) 10/09/2016   EF 45% and grade II diastolic after nstemi  . Diet-controlled diabetes mellitus (HCC)   . DJD (degenerative joint disease)   . Gout   . High cholesterol   . Hypertension   . NSTEMI (non-ST elevated myocardial infarction) (HCC) 09/20/2016   95% Ramus - > PCI   . Obesity   . Recurrent pulmonary embolism (HCC) 11/'14; 4/'19   a) Bilateral segmental and subsegmental pulmonary emboli with mild RV Strain.; b) after fall with C-spine Fxr --> Acute PE of right main pulmonary artery extending into multiple segments.   Patient Active Problem List   Diagnosis Date Noted  . Recurrent pulmonary embolism (HCC)     Priority: High  . C4 spinal cord injury, sequela (HCC) 12/18/2017    Priority: High  . Tetraplegia (HCC) 12/18/2017    Priority: High  . Aortic aneurysm (HCC) 12/16/2017    Priority: High  . Cord compression (HCC) 12/13/2017    Priority: High  . Chronic combined systolic and diastolic heart failure (HCC) 10/09/2016    Priority: High  . CAD S/P  percutaneous coronary angioplasty 09/20/2016    Priority: High  . Morbid obesity (HCC) 07/18/2013    Priority: High  . Diabetes mellitus type 2, controlled (HCC) 07/17/2010    Priority: High  . Bilateral lower extremity edema 12/08/2008    Priority: High  . Angioedema of lips 07/13/2013    Priority: Medium  . Hyperlipidemia associated with type 2 diabetes mellitus (HCC) 10/02/2011    Priority: Medium  . BPH with urinary obstruction 07/17/2010    Priority: Medium  . Gout 03/15/2007    Priority: Medium  . Hypertension associated with diabetes (HCC) 03/15/2007    Priority: Medium  . Pain of left heel     Priority: Low  . Primary osteoarthritis of knees, bilateral 01/11/2016    Priority: Low  . Erectile dysfunction 07/30/2015    Priority: Low  . Multinodular goiter 07/13/2013    Priority: Low  . SOB (shortness of breath) 10/02/2011    Priority: Low  . NEUROPATHY, IDIOPATHIC PERIPHERAL NEC 05/31/2007    Priority: Low  . Osteoarthritis 03/22/2007    Priority: Low   Past Surgical History:  Procedure Laterality Date  . CARDIAC CATHETERIZATION N/A 09/19/2016   Procedure: Left Heart Cath and Coronary Angiography;  Surgeon: Orpah Cobb, MD;  Location: [facility] INVASIVE CV LAB;  Service: Cardiovascular: 95% proximal Ramus Intermedius --> PCI  . CARDIAC CATHETERIZATION N/A 09/19/2016   Procedure: Coronary Stent Intervention;  Surgeon: Yvonne Kendall, MD;  Location: [facility] INVASIVE CV LAB;  Service: Cardiovascular: 95% ramus  intermedius;  Resolute Onyx 2.75 x 18 mm drug-eluting stent  . CYSTOSCOPY/RETROGRADE/URETEROSCOPY/STONE EXTRACTION WITH BASKET  [id number]   ureteral stone   . MENISCUS REPAIR Left    knee open meniscetomy  . TRANSTHORACIC ECHOCARDIOGRAM  2004   no lvh nl ejection fraction  . TRANSTHORACIC ECHOCARDIOGRAM  09/19/2016   In setting of NSTEMI:  EF 45-50% with diffuse hypokinesis. GR 2 DD. Mild biatrial enlargement.    Family History  Problem Relation Age of Onset  . Liver  disease Mother     Medications- reviewed and updated Current Outpatient Medications  Medication Sig Dispense Refill  . acetaminophen (TYLENOL) 325 MG tablet Take 1-2 tablets (325-650 mg total) by mouth every 4 (four) hours as needed for mild pain.    Marland Kitchen aspirin EC 81 MG tablet Take 1 tablet (81 mg total) by mouth daily. 90 tablet 3  . atorvastatin (LIPITOR) 80 MG tablet Take 1 tablet (80 mg total) by mouth daily at 6 PM. 90 tablet 3  . diclofenac sodium (VOLTAREN) 1 % GEL Apply 1 application topically 3 (three) times daily. Both knees 3 Tube 4  . furosemide (LASIX) 20 MG tablet TAKE 1 TABLET BY MOUTH EVERY MONDAY, WEDNESDAY, AND FRIDAY. NEED OFFICE VISIT FOR MORE REFILLS 45 tablet 0  . metoprolol tartrate (LOPRESSOR) 50 MG tablet TAKE 1 TABLET BY MOUTH TWICE A DAY 60 tablet 10  . Multiple Vitamin (MULTIVITAMIN WITH MINERALS) TABS tablet Take 1 tablet by mouth daily.    . Potassium Citrate 15 MEQ (1620 MG) TBCR Take 1 tablet by mouth 2 (two) times daily.  11  . rivaroxaban (XARELTO) 20 MG TABS tablet TAKE 1 TABLET (20 MG TOTAL) BY MOUTH DAILY WITH SUPPER. 90 tablet 3  . terazosin (HYTRIN) 10 MG capsule Take 1 capsule (10 mg total) by mouth at bedtime. 90 capsule 3  . vitamin B-12 (CYANOCOBALAMIN) 100 MCG tablet Take 1 tablet (100 mcg total) by mouth daily.     No current facility-administered medications for this visit.    Allergies-reviewed and updated Allergies  Allergen Reactions  . Ace Inhibitors     Angioedema  . Other Hives and Itching    msg    Social History   Social History Narrative   Married for 44 years. He has 3 children and 5 grandchildren. They also 5 great grandchildren. He lives with his wife. He currently works as an Tourist information centre manager for [employer]   Occasional beer or wine   No smoking history or illicit drug use.   One hour water aerobics sessions 3 days a week. Has not started back yet read from recent illness sounds.   Objective   Objective:  BP 130/74 (BP Location: Left Arm, Patient Position: Sitting, Cuff Size: Large)   Pulse 69   Temp 99 F (37.2 C) (Temporal)   Ht 6' (1.829 m)   Wt (!) 396 lb 6.1 oz (179.8 kg)   SpO2 95%   BMI 53.76 kg/m  Gen: NAD, resting comfortably HEENT: Mask not removed due to covid 19. TM normal. Bridge of nose normal. Eyelids normal.  Neck: no thyromegaly or cervical lymphadenopathy  CV: RRR no murmurs rubs or gallops Lungs: CTAB no crackles, wheeze, rhonchi Abdomen: soft/nontender/nondistended/normal bowel sounds. No rebound or guarding.  GEX:BMWUXL 1+ edema Skin: warm, dry Neuro: wheelchair bound, weakness in legs noted  Diabetic Foot Exam - Simple   Simple Foot Form Diabetic Foot exam was performed with the following findings: Yes 01/06/2020  4:00 PM  Visual Inspection See comments: Yes Sensation Testing Intact to touch and monofilament testing bilaterally: Yes Pulse Check Posterior Tibialis and Dorsalis pulse intact bilaterally: Yes Comments Callus formation on medial aspects of both feet rather extensive 1st and 2nd toe push together rather firmly on both feet- concerns me for ulcer formation in future       Assessment and Plan  69 y.o. male presenting for annual physical.  Health Maintenance counseling: 1. Anticipatory guidance: Patient counseled regarding regular dental exams 18 months ago- encouraged follow up, eye exams yearly,  avoiding smoking and second hand smoke , limiting alcohol to 2 beverages per day - right now not drinking but may try very rare cocktail in future 2. Risk factor reduction:  Advised patient of need for regular exercise and diet rich and fruits and vegetables to reduce risk of heart attack and stroke. Exercise- walking up ramp at home 4-5 x a week-working on his strength with prior compression injury- encouraged that he is working on this- continue current efforts. Diet-unclear amount of weight gain as previously could not get onto scale with  compression injury/tetraplegia. He feels like movement efforts will continue to help him as he gets more active. Doing soups and salads for most part he reports. Doing occasional fried fish once a week and has tried to cut down on fried chicken. Has tried to cut out beef for most part. .  Wt Readings from Last 3 Encounters:  01/06/20 (!) 396 lb 6.1 oz (179.8 kg)  11/29/19 (!) 375 lb (170.1 kg)  07/14/19 (!) 380 lb (172.4 kg)  3. Immunizations/screenings/ancillary studies- id be ok with him getting and johnson and johnson due to him being on [employer] & Coxarelto Immunization History  Administered Date(s) Administered  . Influenza Split 09/15/2011, 07/15/2012  . Influenza Whole 06/25/1998, 05/31/2007, 07/17/2010  . Influenza, High Dose Seasonal PF 09/24/2017  . Influenza,inj,Quad PF,6+ Mos 07/14/2013, 07/30/2015  . Td 08/26/1999, 12/08/2008  4. Prostate cancer screening-   -follows with urology for kidney stones. He thinks PSA was checked with urology- we may do one final check next year if we dont see results by that physical Lab Results  Component Value Date   PSA 1.22 09/24/2017   PSA 1.23 12/26/2014   PSA 1.19 03/02/2013   5. Colon cancer screening - never had colonoscopy. Discussed cologuard-  6. Skin cancer screening- lower risk due to melanin content. advised regular sunscreen use. Denies worrisome, changing, or new skin lesions.  7. Never smoker  Status of chronic or acute concerns   % Cord compression/tetraplegia/sequela of C4 spinal cord injury- patient with severe injury April 2019- has used wheelchair since that time.  Is able to walk with his walker as well.Follows with Dr. Hermelinda MedicusSchwartz  # Recurrent pulmonary embolism  S: Prior DVT 2014 after traveling- believe led to PE. Recurrent PE 11/2017 after fall. Now on lifelong anticoagulation-Xarelto 20 mg A/P: Stable. Continue current medications.   Marland Kitchen. #Chronic systolic and diastolic heart failure/hypertension S: Compliant: lasix 3 days a week or  less (tries to limit fluids), metoprolol 50mg  BID, terazosin 10mg  (also helps with BPH). Takes potassium through urology Home readings #s: has not checked blood pressure at home for the past 4 months. For CHF- has been stable with Lasix 3 times a weekor less.  Reports no chest pain or shortness of breath or increased leg edema   For blood pressure- controlled today A/P: Stable x2-continue current medications  #CAD/hyperlipidemia/aortic aneurysm. S: CAD followed by Dr. Herbie BaltimoreHarding with history of NSTEMI  now seeing him once yearly  Compliant with aspirin.  Also on Xarelto for recurrent PE.    Dr. Herbie Baltimore follows aortic aneurysm yearly with imagingtypically- this got pushed back with covid. Last imaging 12/15/17 CT angio chest with 4.0  m ascending aneursym.    Mild poorly controlled on atorvastatin 80mg  daily in the past-ideally LDL would be under 70.  LDL has been under 80-have been focusing on lifestyle.  Could consider Zetia Lab Results  Component Value Date   CHOL 110 11/29/2019   HDL 33.30 (L) 11/29/2019   LDLCALC 61 11/29/2019   LDLDIRECT 72.0 10/09/2016   TRIG 80.0 11/29/2019   CHOLHDL 3 11/29/2019  A/P: Coronary artery disease appears asymptomatic.continue current meds  Hyperlipidemia-well-controlled-continue current medication  Aortic aneurysm-we will defer management to Dr. 01/29/2020.  From his last note " Aortic aneurysm (HCC) (Chronic)     I would probably not consider this to be an aneurysm in the thoracic aorta.  Probably just upper limit of normal finding.  Can be monitored intermittently by relook echo in the future.     "  # Diabetes/morbid obesity.  S:  controlled on no rx previously.  Has been on orange juice daily to help with kidney stones. Trying to limit bread intake. Does not check blood sugar.  Lab Results  Component Value Date   HGBA1C 8.2 (H) 11/29/2019   A/P: Poor control with A1c up to 8.2.  Patient would like to be aggressive about healthy eating/increasing  his movement to see if he can bring the numbers down without medicine. -We will send him in a meter and ideally morning sugars would be between 90 and 110 or at least under 120.  Having a meter on hand may help him figure out if he is making progress with home changes. -We discussed using Metformin but he would like to hold off on this for now unless he fails to make improvement over the coming months-would like 14-week close follow-up to make sure he is improving.  For obesity- Encouraged need for healthy eating, regular exercise, weight loss.   Recommended follow up: Return in about 3 months (around 04/07/2020) for follow up- or sooner if needed. Future Appointments  Date Time Provider Department Center  01/12/2020  2:20 PM 01/14/2020, NP [facility]   Lab/Order associations: already had labs   ICD-10-CM   1. Preventative health care  Z00.00   2. Recurrent pulmonary embolism (HCC)  I26.99   3. Cord compression (HCC)  G95.20   4. Chronic combined systolic and diastolic heart failure (HCC)  [place]   5. Controlled type 2 diabetes mellitus without complication, without long-term current use of insulin (HCC)  E11.9 Microalbumin / creatinine urine ratio  6. Thoracic aortic aneurysm without rupture (HCC)  I71.2   7. Hypertension associated with diabetes (HCC)  E11.59    I10   8. Hyperlipidemia associated with type 2 diabetes mellitus (HCC)  E11.69    E78.5   9. Ascending aortic aneurysm (HCC)  I71.2   10. Screen for colon cancer  Z12.11 Cologuard  11. Calluse  L84 Ambulatory referral to Podiatry    Meds ordered this encounter  Medications  . atorvastatin (LIPITOR) 80 MG tablet    Sig: Take 1 tablet (80 mg total) by mouth daily at 6 PM.    Dispense:  90 tablet    Refill:  3  . rivaroxaban (XARELTO) 20 MG TABS tablet    Sig: TAKE 1 TABLET (20 MG TOTAL) BY  MOUTH DAILY WITH SUPPER.    Dispense:  90 tablet    Refill:  3  . terazosin (HYTRIN) 10 MG capsule    Sig: Take 1 capsule (10  mg total) by mouth at bedtime.    Dispense:  90 capsule    Refill:  3   The entirety of the information documented in the History of Present Illness, Review of Systems and Physical Exam were personally obtained by me. Portions of this information were initially documented by the CMA and reviewed by me for thoroughness and accuracy.   Tana Conch, MD  Return precautions advised.  Tana Conch, MD

## 2020-01-07 LAB — MICROALBUMIN / CREATININE URINE RATIO
Creatinine, Urine: 177 mg/dL (ref 20–320)
Microalb Creat Ratio: 6 mcg/mg creat (ref <30)
Microalb, Ur: 1 mg/dL

## 2020-01-12 ENCOUNTER — Encounter: Payer: Medicare PPO | Attending: Registered Nurse | Admitting: Registered Nurse

## 2020-01-30 ENCOUNTER — Encounter: Payer: Self-pay | Admitting: Podiatry

## 2020-01-30 ENCOUNTER — Other Ambulatory Visit: Payer: Self-pay

## 2020-01-30 ENCOUNTER — Ambulatory Visit (INDEPENDENT_AMBULATORY_CARE_PROVIDER_SITE_OTHER): Payer: Medicare PPO | Admitting: Podiatry

## 2020-01-30 DIAGNOSIS — G8250 Quadriplegia, unspecified: Secondary | ICD-10-CM | POA: Diagnosis not present

## 2020-01-30 DIAGNOSIS — B351 Tinea unguium: Secondary | ICD-10-CM

## 2020-01-30 DIAGNOSIS — M79674 Pain in right toe(s): Secondary | ICD-10-CM

## 2020-01-30 DIAGNOSIS — M79675 Pain in left toe(s): Secondary | ICD-10-CM | POA: Diagnosis not present

## 2020-01-30 DIAGNOSIS — L853 Xerosis cutis: Secondary | ICD-10-CM

## 2020-01-30 DIAGNOSIS — S14104S Unspecified injury at C4 level of cervical spinal cord, sequela: Secondary | ICD-10-CM | POA: Diagnosis not present

## 2020-01-30 MED ORDER — AMMONIUM LACTATE 12 % EX LOTN: 1.0000 "application " | TOPICAL_LOTION | CUTANEOUS | 0 refills | Status: AC | PRN

## 2020-01-30 NOTE — Progress Notes (Signed)
  Subjective:  Patient ID: Franklin Hicks, male    DOB: May 05, 1951,  MRN: [id number]  Chief Complaint  Patient presents with  . Callouses    pt is here for calluses as well as some possible dry skin, that he is looking to have looked at.   69 y.o. male returns for the above complaint.  Patient presents with thickened elongated dystrophic toenails x10.  They are painful to touch.  Patient has secondary complaint of xerosis to bilateral lower extremity.  Patient has a longstanding lymphedema to bilateral lower extremity.  He is a borderline diabetic.  He denies any other acute complaints.  He would like to have the nails debrided down is is not able to do it himself.  He would like to also discuss long-term management for xerosis of bilateral lower extremity.  Objective:  There were no vitals filed for this visit. Podiatric Exam: Vascular: dorsalis pedis and posterior tibial pulses are palpable bilateral. Capillary return is immediate. Temperature gradient is WNL. Skin turgor WNL  Sensorium: Normal Semmes Weinstein monofilament test. Normal tactile sensation bilaterally. Nail Exam: Pt has thick disfigured discolored nails with subungual debris noted bilateral entire nail hallux through fifth toenails.  Pain on palpation to the nails. Ulcer Exam: There is no evidence of ulcer or pre-ulcerative changes or infection. Orthopedic Exam: Muscle tone and strength are WNL. No limitations in general ROM. No crepitus or effusions noted. HAV  B/L.  Hammer toes 2-5  B/L. Skin: No Porokeratosis. No infection or ulcers.  Xerosis bilateral lower extremity.    Assessment & Plan:   1. Xerosis cutis   2. Pain due to onychomycosis of toenails of both feet     Patient was evaluated and treated and all questions answered.  Xerosis bilateral lower extremity -I explained to the patient the etiology of xerosis and various treatment options were extensively discussed.  I explained to the patient the importance of  maintaining moisturization of the skin with application of over-the-counter lotion such as Eucerin or Luciderm.  Patient has tried this and would like to discuss prescription lotion. -AmLactin cream was dispensed to the pharmacy.   Onychomycosis with pain  -Nails palliatively debrided as below. -Educated on self-care  Procedure: Nail Debridement Rationale: pain  Type of Debridement: manual, sharp debridement. Instrumentation: Nail nipper, rotary burr. Number of Nails: 10  Procedures and Treatment: Consent by patient was obtained for treatment procedures. The patient understood the discussion of treatment and procedures well. All questions were answered thoroughly reviewed. Debridement of mycotic and hypertrophic toenails, 1 through 5 bilateral and clearing of subungual debris. No ulceration, no infection noted.  Return Visit-Office Procedure: Patient instructed to return to the office for a follow up visit 3 months for continued evaluation and treatment.  Nicholes Rough, DPM    No follow-ups on file.

## 2020-02-19 ENCOUNTER — Other Ambulatory Visit: Payer: Self-pay | Admitting: Cardiovascular Disease

## 2020-02-29 DIAGNOSIS — G8250 Quadriplegia, unspecified: Secondary | ICD-10-CM | POA: Diagnosis not present

## 2020-02-29 DIAGNOSIS — S14104S Unspecified injury at C4 level of cervical spinal cord, sequela: Secondary | ICD-10-CM | POA: Diagnosis not present

## 2020-03-31 DIAGNOSIS — G8250 Quadriplegia, unspecified: Secondary | ICD-10-CM | POA: Diagnosis not present

## 2020-03-31 DIAGNOSIS — S14104S Unspecified injury at C4 level of cervical spinal cord, sequela: Secondary | ICD-10-CM | POA: Diagnosis not present

## 2020-04-10 ENCOUNTER — Ambulatory Visit: Payer: Medicare PPO | Admitting: Family Medicine

## 2020-04-24 DIAGNOSIS — H40023 Open angle with borderline findings, high risk, bilateral: Secondary | ICD-10-CM | POA: Diagnosis not present

## 2020-05-01 DIAGNOSIS — G8250 Quadriplegia, unspecified: Secondary | ICD-10-CM | POA: Diagnosis not present

## 2020-05-01 DIAGNOSIS — S14104S Unspecified injury at C4 level of cervical spinal cord, sequela: Secondary | ICD-10-CM | POA: Diagnosis not present

## 2020-05-04 ENCOUNTER — Ambulatory Visit: Payer: Medicare PPO | Admitting: Podiatry

## 2020-05-05 ENCOUNTER — Other Ambulatory Visit: Payer: Self-pay | Admitting: Family Medicine

## 2020-05-31 DIAGNOSIS — S14104S Unspecified injury at C4 level of cervical spinal cord, sequela: Secondary | ICD-10-CM | POA: Diagnosis not present

## 2020-05-31 DIAGNOSIS — G8250 Quadriplegia, unspecified: Secondary | ICD-10-CM | POA: Diagnosis not present

## 2020-06-06 ENCOUNTER — Ambulatory Visit: Payer: Medicare PPO | Admitting: Podiatry

## 2020-06-26 NOTE — Progress Notes (Deleted)
Phone [phone] In person visit   Subjective:   Franklin Hicks is a 69 y.o. year old very pleasant male patient who presents for/with See problem oriented charting No chief complaint on file.   This visit occurred during the SARS-CoV-2 public health emergency.  Safety protocols were in place, including screening questions prior to the visit, additional usage of staff PPE, and extensive cleaning of exam room while observing appropriate contact time as indicated for disinfecting solutions.   Past Medical History-  Patient Active Problem List   Diagnosis Date Noted  . Recurrent pulmonary embolism (HCC)   . Pain of left heel   . C4 spinal cord injury, sequela (HCC) 12/18/2017  . Tetraplegia (HCC) 12/18/2017  . Aortic aneurysm (HCC) 12/16/2017  . Cord compression (HCC) 12/13/2017  . Chronic combined systolic and diastolic heart failure (HCC) 10/09/2016  . CAD S/P percutaneous coronary angioplasty 09/20/2016  . Primary osteoarthritis of knees, bilateral 01/11/2016  . Erectile dysfunction 07/30/2015  . Morbid obesity (HCC) 07/18/2013  . Angioedema of lips 07/13/2013  . Multinodular goiter 07/13/2013  . Hyperlipidemia associated with type 2 diabetes mellitus (HCC) 10/02/2011  . SOB (shortness of breath) 10/02/2011  . BPH with urinary obstruction 07/17/2010  . Diabetes mellitus type 2, controlled (HCC) 07/17/2010  . Bilateral lower extremity edema 12/08/2008  . NEUROPATHY, IDIOPATHIC PERIPHERAL NEC 05/31/2007  . Osteoarthritis 03/22/2007  . Gout 03/15/2007  . Hypertension associated with diabetes (HCC) 03/15/2007    Medications- reviewed and updated Current Outpatient Medications  Medication Sig Dispense Refill  . acetaminophen (TYLENOL) 325 MG tablet Take 1-2 tablets (325-650 mg total) by mouth every 4 (four) hours as needed for mild pain.    Marland Kitchen ammonium lactate (AMLACTIN) 12 % lotion Apply 1 application topically as needed for dry skin. 400 g 0  . aspirin EC 81 MG tablet Take 1  tablet (81 mg total) by mouth daily. 90 tablet 3  . atorvastatin (LIPITOR) 80 MG tablet Take 1 tablet (80 mg total) by mouth daily at 6 PM. 90 tablet 3  . diclofenac sodium (VOLTAREN) 1 % GEL Apply 1 application topically 3 (three) times daily. Both knees 3 Tube 4  . furosemide (LASIX) 20 MG tablet TAKE 1 TABLET BY MOUTH EVERY MONDAY, WEDNESDAY, AND FRIDAY. NEED OFFICE VISIT FOR MORE REFILLS 45 tablet 0  . metoprolol tartrate (LOPRESSOR) 50 MG tablet TAKE 1 TABLET BY MOUTH TWICE A DAY 180 tablet 0  . Multiple Vitamin (MULTIVITAMIN WITH MINERALS) TABS tablet Take 1 tablet by mouth daily.    . Potassium Citrate 15 MEQ (1620 MG) TBCR Take 1 tablet by mouth 2 (two) times daily.  11  . terazosin (HYTRIN) 10 MG capsule Take 1 capsule (10 mg total) by mouth at bedtime. 90 capsule 3  . vitamin B-12 (CYANOCOBALAMIN) 100 MCG tablet Take 1 tablet (100 mcg total) by mouth daily.    [name] 20 MG TABS tablet TAKE 1 TABLET BY MOUTH EVERY DAY WITH SUPPER 30 tablet 3   No current facility-administered medications for this visit.     Objective:  There were no vitals taken for this visit. Gen: NAD, resting comfortably CV: RRR no murmurs rubs or gallops Lungs: CTAB no crackles, wheeze, rhonchi Abdomen: soft/nontender/nondistended/normal bowel sounds. No rebound or guarding.  Ext: no edema Skin: warm, dry Neuro: grossly normal, moves all extremities  ***    Assessment and Plan  *** ***12/22/2019 AWV ***cpe 01/06/20 % Cord compression/tetraplegia/sequela of C4 spinal cord injury- patient with severe injury April  2019- has used wheelchair since that time.  Is able to walk with his walker as well.***Follows with Dr. Hermelinda Medicus now as needed  # Recurrent pulmonary embolism  S: Prior DVT 2014 after traveling- believe led to PE. Recurrent PE 11/2017 after fall. Now on lifelong anticoagulation-Xarelto 20 mg A/P: ***  . #Chronic systolic and diastolic heart failure/hypertension S: Compliant lasix 3 days a  week, metoprolol 50mg  BID, terazosin 10mg  (also helps with BPH). Takes potassium through urology For CHF- has been stable with Lasix 3 times a week***.  Reports***  For blood pressure- Next step would likely be increasing Lasix.  Amlodipine would likely worsen edema and not ideal in heart failure. A/P: ***   #CAD/hyperlipidemia/aortic aneurysm. S: CAD followed by Dr. with history of NSTEMI.  Compliant with aspirin.  Also on Xarelto for recurrent PE.  Dr. follows aortic aneurysm yearly with imaging***   Mild poorly controlled on atorvastatin 80mg  daily in the past-ideally LDL would be under 70.  LDL has been under 80-have been focusing on lifestyle.  Could consider Zetia  A/P: ***   # Diabetes/morbid obesity.  S:  controlled on no rx previously. ***  For morbid obesity and diabetes exercise and diet- trying to watch his sugar and pasta***.  Does some limited exercise around the house***   A/P: ***   No problem-specific Assessment & Plan notes found for this encounter.   Recommended follow up: ***No follow-ups on file. Future Appointments  Date Time Provider Department Center  06/29/2020  2:40 PM Herbie Baltimore, MD [facility]  08/08/2020  4:00 PM Shelva Majestic, MD [facility]    Lab/Order associations: No diagnosis found.  No orders of the defined types were placed in this encounter.   Time Spent: *** minutes of total time (4:34 PM***- 4:34 PM***) was spent on the date of the encounter performing the following actions: chart review prior to seeing the patient, obtaining history, performing a medically necessary exam, counseling on the treatment plan, placing orders, and documenting in our EHR.   Return precautions advised.  08/10/2020, CMA

## 2020-06-29 ENCOUNTER — Ambulatory Visit: Payer: Medicare PPO | Admitting: Family Medicine

## 2020-06-29 DIAGNOSIS — M1A9XX0 Chronic gout, unspecified, without tophus (tophi): Secondary | ICD-10-CM

## 2020-06-29 DIAGNOSIS — E1159 Type 2 diabetes mellitus with other circulatory complications: Secondary | ICD-10-CM

## 2020-06-29 DIAGNOSIS — E119 Type 2 diabetes mellitus without complications: Secondary | ICD-10-CM

## 2020-06-29 DIAGNOSIS — Z0289 Encounter for other administrative examinations: Secondary | ICD-10-CM

## 2020-06-29 DIAGNOSIS — E1169 Type 2 diabetes mellitus with other specified complication: Secondary | ICD-10-CM

## 2020-07-01 DIAGNOSIS — S14104S Unspecified injury at C4 level of cervical spinal cord, sequela: Secondary | ICD-10-CM | POA: Diagnosis not present

## 2020-07-01 DIAGNOSIS — G8250 Quadriplegia, unspecified: Secondary | ICD-10-CM | POA: Diagnosis not present

## 2020-07-03 ENCOUNTER — Other Ambulatory Visit: Payer: Self-pay | Admitting: Cardiovascular Disease

## 2020-07-31 DIAGNOSIS — G8250 Quadriplegia, unspecified: Secondary | ICD-10-CM | POA: Diagnosis not present

## 2020-07-31 DIAGNOSIS — S14104S Unspecified injury at C4 level of cervical spinal cord, sequela: Secondary | ICD-10-CM | POA: Diagnosis not present

## 2020-08-08 ENCOUNTER — Ambulatory Visit: Payer: Medicare PPO | Admitting: Cardiology

## 2020-08-08 ENCOUNTER — Other Ambulatory Visit: Payer: Self-pay

## 2020-08-08 ENCOUNTER — Encounter: Payer: Self-pay | Admitting: Cardiology

## 2020-08-08 DIAGNOSIS — I2699 Other pulmonary embolism without acute cor pulmonale: Secondary | ICD-10-CM

## 2020-08-08 DIAGNOSIS — E1159 Type 2 diabetes mellitus with other circulatory complications: Secondary | ICD-10-CM

## 2020-08-08 DIAGNOSIS — Z9861 Coronary angioplasty status: Secondary | ICD-10-CM | POA: Diagnosis not present

## 2020-08-08 DIAGNOSIS — R600 Localized edema: Secondary | ICD-10-CM

## 2020-08-08 DIAGNOSIS — I152 Hypertension secondary to endocrine disorders: Secondary | ICD-10-CM

## 2020-08-08 DIAGNOSIS — I5042 Chronic combined systolic (congestive) and diastolic (congestive) heart failure: Secondary | ICD-10-CM | POA: Diagnosis not present

## 2020-08-08 DIAGNOSIS — I2510 Atherosclerotic heart disease of native coronary artery without angina pectoris: Secondary | ICD-10-CM

## 2020-08-08 NOTE — Patient Instructions (Signed)
Medication Instructions:  Continue same medications *If you need a refill on your cardiac medications before your next appointment, please call your pharmacy*   Lab Work: None ordered   Testing/Procedures: None ordered   Follow-Up: At [facility], you and your health needs are our priority.  As part of our continuing mission to provide you with exceptional heart care, we have created designated Provider Care Teams.  These Care Teams include your primary Cardiologist (physician) and Advanced Practice Providers (APPs -  Physician Assistants and Nurse Practitioners) who all work together to provide you with the care you need, when you need it.  We recommend signing up for the patient portal called "MyChart".  Sign up information is provided on this After Visit Summary.  MyChart is used to connect with patients for Virtual Visits (Telemedicine).  Patients are able to view lab/test results, encounter notes, upcoming appointments, etc.  Non-urgent messages can be sent to your provider as well.   To learn more about what you can do with MyChart, go to [email or web address].    Your next appointment:  6 months    Monday 02/05/20 at 4:20 pm   The format for your next appointment: Office     Provider:  Dr.Harding

## 2020-08-08 NOTE — Progress Notes (Signed)
Primary Care Provider: Shelva Majestic, MD Cardiologist: No primary care provider on file. Electrophysiologist: None  Clinic Note: Chief Complaint  Patient presents with  . Follow-up    1 year -> major issue to discuss = > his wife recently died  . Coronary Artery Disease    No angina    Problem List Items Addressed This Visit    CAD S/P percutaneous coronary angioplasty (Chronic)    Thankfully, no further anginal symptoms.  He is on beta-blocker high-dose statin and aspirin.  Not on Plavix because of Xarelto.      Hypertension associated with diabetes (HCC) (Chronic)    Borderline blood pressure today.  Usually pretty controlled.  He has noted several medication interactions as well.    We will continue current meds.  He is on Lopressor and then PRN Lasix.  I recommended he takes Lasix in a more routine regimen -> at least to 3 days a week.  With additional doses as needed..  If additional blood pressure required, would probably convert from Lopressor to carvedilol.  HCTZ was stopped in the past, but not for a clear reason.  Simply because he was started on cardiac medications.      Bilateral lower extremity edema (Chronic)    Try to elevate feet when possible.  Continue support stockings.  Consider taking standing dose of Lasix at least 2 days a week and then additional dose as needed.  Likely related to venous stasis or muscle atrophy as opposed to being from CHF.      Chronic combined systolic and diastolic heart failure (HCC) (Chronic)    EF relatively stabilized, low normal after STEMI.  No signs or symptoms of heart failure per se.  He is on Lopressor and as needed Lasix. Did ask that he may take Lasix on a more STEMI basis least 3 days a week with additional doses as needed.      Recurrent pulmonary embolism (HCC) (Chronic)    Recurrent VTE-PE, on long-term Xarelto-made her more important by the fact that he is extremely immobilized.  We have therefore stopped  antiplatelet agent and he is on aspirin only.  No bleeding issues.         HPI:    Franklin Hicks is a 69 y.o. male with a PMH notable for CAD-non-STEMI (08/2016: DES PCI of RI), recurrent PE (most notably April 2019), and Central Cord Syndrome following C-spine injury in April 2019 (now on wheelchair) who presents today for annual follow-up.  He was initially a patient of Dr. Algie Coffer at the time of his MI. ->  PCI performed by Dr. Okey Dupre as covering IC Cardiologist.  He chose transfer care and I saw him for the first time on May 26, 2018.  Franklin Sjogren Mcadoo was last seen on July 14, 2019-that he was doing fairly well with no active cardiac symptoms with exception of bilateral leg swelling usually controlled with Lasix that he takes 2 days a week along with bursitis.  Minimal walking, mostly wheelchair-bound.  Recent Hospitalizations: None  Reviewed  CV studies:    The following studies were reviewed today: (if available, images/films reviewed: From Epic Chart or Care Everywhere) . None:   Interval History:   Franklin Hicks returns here today really stable from a cardiac standpoint.  He is off and on spells where he feels irregular heartbeats or tugging sensation in his chest, but he does not really do any exertion.  Nothing similar to his MI type  pain.  He also has near syncope type spells where he feels lightheaded and dizzy and this occurs while seated.  He does not feel any irregular or fast heart rates.  He is very limited as far as activity goes.  Probably is paraplegia (only able to walk maybe 400 feet with a walker), it is also splinted because of both knees (left> right) bothering him with significant pain.  Swelling is pretty stable.  No PND orthopnea.  CV Review of Symptoms (Summary) positive for - chest pain, dyspnea on exertion, edema, palpitations and Generalized fatigue negative for - orthopnea, paroxysmal nocturnal dyspnea, shortness of breath or No true syncope, but has had  some lightheadedness/near syncope.  No TIA or hemispheres.  No claudication   The major issue discussed today however was that his wife passed away over the summer, and he is extremely depressed with grieving.  He feels even more limited now because she used to help him out.  She also get more reasonable to continue living.  Without her, he is becoming quite depressed.  No suicidal ideation, just needed someone to talk to.  After the initial 20 minutes of clinical symptom/treatment discussion, we spent additional 25 minutes talk about psychosocial issues  The patient does not have symptoms concerning for COVID-19 infection (fever, chills, cough, or new shortness of breath).   REVIEWED OF SYSTEMS   Review of Systems  Constitutional: Positive for malaise/fatigue (No energy.  Almost clearly active.  Worse now that he is somewhat depressed.). Negative for weight loss (He is actually gaining weight.).  Respiratory: Positive for shortness of breath (With exertion).   Cardiovascular: Positive for leg swelling.  Gastrointestinal: Positive for constipation. Negative for abdominal pain, blood in stool and melena.  Genitourinary: Negative for hematuria.  Musculoskeletal: Positive for back pain, joint pain and myalgias (Legs). Negative for falls.  Neurological: Positive for focal weakness (Both legs). Negative for dizziness.  Endo/Heme/Allergies: Positive for environmental allergies. Bruises/bleeds easily.  Psychiatric/Behavioral: Negative for depression (Not true depression, just extreme dysthymia and grieving.  The loss of his wife.) and memory loss. The patient is not nervous/anxious.    I have reviewed and (if needed) personally updated the patient's problem list, medications, allergies, past medical and surgical history, social and family history.   PAST MEDICAL HISTORY   Past Medical History:  Diagnosis Date  . Arthritis    "knees" (12/17/2017)  . CAD S/P percutaneous coronary angioplasty  09/20/2016   Nstemi 08/2016. Dr. Algie Coffer. DES- brilinta and asa. Requests change to [facility] cardiology; 95% Ramus -> PCI Resolute Onyx DES 2.75 x 18  . Central cord syndrome (HCC) 12/17/2017  . Chronic combined systolic and diastolic heart failure (HCC) 10/09/2016   EF 45% and grade II diastolic after nstemi  . Diet-controlled diabetes mellitus (HCC)   . DJD (degenerative joint disease)   . Gout   . High cholesterol   . Hypertension   . NSTEMI (non-ST elevated myocardial infarction) (HCC) 09/20/2016   95% Ramus - > PCI   . Obesity   . Recurrent pulmonary embolism (HCC) 11/'14; 4/'19   a) Bilateral segmental and subsegmental pulmonary emboli with mild RV Strain.; b) after fall with C-spine Fxr --> Acute PE of right main pulmonary artery extending into multiple segments.    PAST SURGICAL HISTORY   Past Surgical History:  Procedure Laterality Date  . CARDIAC CATHETERIZATION N/A 09/19/2016   Procedure: Left Heart Cath and Coronary Angiography;  Surgeon: Orpah Cobb, MD;  Location: [facility] INVASIVE CV LAB;  Service: Cardiovascular: 95% proximal Ramus Intermedius --> PCI  . CARDIAC CATHETERIZATION N/A 09/19/2016   Procedure: Coronary Stent Intervention;  Surgeon: Yvonne Kendallhristopher End, MD;  [facility];  Service: Cardiovascular: 95% ramus intermedius;  Resolute Onyx 2.75 x 18 mm drug-eluting stent  . CYSTOSCOPY/RETROGRADE/URETEROSCOPY/STONE EXTRACTION WITH BASKET  1610,[id number],1999   ureteral stone   . MENISCUS REPAIR Left    knee open meniscetomy  . TRANSTHORACIC ECHOCARDIOGRAM  2004   no lvh nl ejection fraction  . TRANSTHORACIC ECHOCARDIOGRAM  09/19/2016   In setting of NSTEMI:  EF 45-50% with diffuse hypokinesis. GR 2 DD. Mild biatrial enlargement.    Cardiac cath 09/19/2016:95% ramus intermedius; Resolute Onyx 2.75 x 18 mm drug-eluting stent   Immunization History  Administered Date(s) Administered  . Influenza Split 09/15/2011, 07/15/2012  . Influenza Whole 06/25/1998,  05/31/2007, 07/17/2010  . Influenza, High Dose Seasonal PF 09/24/2017  . Influenza,inj,Quad PF,6+ Mos 07/14/2013, 07/30/2015  . Td 08/26/1999, 12/08/2008    MEDICATIONS/ALLERGIES   Current Meds  Medication Sig  . acetaminophen (TYLENOL) 325 MG tablet Take 1-2 tablets (325-650 mg total) by mouth every 4 (four) hours as needed for mild pain.  Marland Kitchen. ammonium lactate (AMLACTIN) 12 % lotion Apply 1 application topically as needed for dry skin.  Marland Kitchen. aspirin EC 81 MG tablet Take 1 tablet (81 mg total) by mouth daily.  Marland Kitchen. atorvastatin (LIPITOR) 80 MG tablet Take 1 tablet (80 mg total) by mouth daily at 6 PM.  . diclofenac sodium (VOLTAREN) 1 % GEL Apply 1 application topically 3 (three) times daily. Both knees  . furosemide (LASIX) 20 MG tablet TAKE 1 TABLET BY MOUTH EVERY MONDAY, WEDNESDAY, AND FRIDAY. NEED OFFICE VISIT FOR MORE REFILLS  . metoprolol tartrate (LOPRESSOR) 50 MG tablet TAKE 1 TABLET BY MOUTH TWICE A DAY  . Multiple Vitamin (MULTIVITAMIN WITH MINERALS) TABS tablet Take 1 tablet by mouth daily.  . Potassium Citrate 15 MEQ (1620 MG) TBCR Take 1 tablet by mouth 2 (two) times daily.  Marland Kitchen. terazosin (HYTRIN) 10 MG capsule Take 1 capsule (10 mg total) by mouth at bedtime.  . vitamin B-12 (CYANOCOBALAMIN) 100 MCG tablet Take 1 tablet (100 mcg total) by mouth daily.  . [DISCONTINUED] XARELTO 20 MG TABS tablet TAKE 1 TABLET BY MOUTH EVERY DAY WITH SUPPER    Allergies  Allergen Reactions  . Ace Inhibitors     Angioedema  . Other Hives and Itching    msg    SOCIAL HISTORY/FAMILY HISTORY   Reviewed in Epic:  Pertinent findings:  Social History   Tobacco Use  . Smoking status: Former Smoker    Packs/day: 1.00    Years: 13.00    Pack years: 13.00    Types: Cigarettes    Quit date: 07/13/1977    Years since quitting: 43.1  . Smokeless tobacco: Never Used  Vaping Use  . Vaping Use: Never used  Substance Use Topics  . Alcohol use: Yes    Alcohol/week: 0.0 standard drinks    Comment:  holidays only  . Drug use: No   Social History   Social History Narrative   Married for 44 years. He has 3 children and 5 grandchildren. They also 5 great grandchildren. He lives with his wife. 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 -> [employer]   Occasional beer or wine   No smoking history or illicit drug use.   One hour water aerobics sessions 3 days a week. Has not started back yet read  from recent illness sounds.    OBJCTIVE -PE, EKG, labs   Wt Readings from Last 3 Encounters:  08/08/20 (!) 391 lb (177.4 kg)  01/06/20 (!) 396 lb 6.1 oz (179.8 kg)  11/29/19 (!) 375 lb (170.1 kg)    Physical Exam: BP 136/79   Pulse 66   Ht 6' (1.829 m)   Wt (!) 391 lb (177.4 kg)   SpO2 97%   BMI 53.03 kg/m  Physical Exam Vitals reviewed.  Constitutional:      General: He is not in acute distress.    Appearance: He is ill-appearing (Somewhat chronically ill-appearing because of wheelchair-bound). He is not toxic-appearing.     Comments: Morbidly obese.  Wheelchair-bound.  Well-groomed.  HENT:     Head: Normocephalic and atraumatic.  Neck:     Vascular: No carotid bruit, hepatojugular reflux or JVD.  Cardiovascular:     Rate and Rhythm: Normal rate and regular rhythm.  No extrasystoles are present.    Chest Wall: PMI is not displaced (Unable to palpate).     Pulses: Intact distal pulses. Decreased pulses (Diminished pedal pulses, probably because of edema.;  And support stockings).     Heart sounds: S1 normal and S2 normal. Heart sounds are distant. Murmur (1/60-3 SEM at RUSB.) heard.  No friction rub. No gallop.   Pulmonary:     Effort: Pulmonary effort is normal. No respiratory distress.     Breath sounds: Normal breath sounds.     Comments: Somewhat distant breath sounds due to body habitus Chest:     Chest wall: No tenderness.  Musculoskeletal:        General: Swelling (1-2+ bilateral.  Wearing support stockings.) present.     Cervical back:  Normal range of motion and neck supple.     Comments: Mild muscle atrophy of legs  Skin:    General: Skin is warm and dry.  Neurological:     Mental Status: He is alert and oriented to person, place, and time. Mental status is at baseline.     Cranial Nerves: No cranial nerve deficit.     Comments: Bilateral leg is/paresthesia; in wheelchair  Psychiatric:        Behavior: Behavior normal.        Thought Content: Thought content normal.        Judgment: Judgment normal.     Comments: Very depressed, sad mood.  Seems somewhat dejected after his wife's death.     Adult ECG Report  Rate: 66 ;  Rhythm: normal sinus rhythm and Cannot exclude anterior MI, age indeterminate.  Otherwise normal axis, intervals durations.;   Narrative Interpretation: Stable  Recent Labs: Reviewed Lab Results  Component Value Date   CHOL 110 11/29/2019   HDL 33.30 (L) 11/29/2019   LDLCALC 61 11/29/2019   LDLDIRECT 72.0 10/09/2016   TRIG 80.0 11/29/2019   CHOLHDL 3 11/29/2019   Lab Results  Component Value Date   CREATININE 1.17 11/29/2019   BUN 17 11/29/2019   NA 138 11/29/2019   K 4.8 11/29/2019   CL 102 11/29/2019   CO2 29 11/29/2019   Lab Results  Component Value Date   TSH 1.37 09/24/2017    ASSESSMENT/PLAN   Problem List Items Addressed This Visit    CAD S/P percutaneous coronary angioplasty (Chronic)    Thankfully, no further anginal symptoms.  He is on beta-blocker high-dose statin and aspirin.  Not on Plavix because of Xarelto.      Hypertension associated with diabetes (HCC) (  Chronic)    Borderline blood pressure today.  Usually pretty controlled.  He has noted several medication interactions as well.    We will continue current meds.  He is on Lopressor and then PRN Lasix.  I recommended he takes Lasix in a more routine regimen -> at least to 3 days a week.  With additional doses as needed..  If additional blood pressure required, would probably convert from Lopressor to carvedilol.   HCTZ was stopped in the past, but not for a clear reason.  Simply because he was started on cardiac medications.      Bilateral lower extremity edema (Chronic)    Try to elevate feet when possible.  Continue support stockings.  Consider taking standing dose of Lasix at least 2 days a week and then additional dose as needed.  Likely related to venous stasis or muscle atrophy as opposed to being from CHF.      Chronic combined systolic and diastolic heart failure (HCC) (Chronic)    EF relatively stabilized, low normal after STEMI.  No signs or symptoms of heart failure per se.  He is on Lopressor and as needed Lasix. Did ask that he may take Lasix on a more STEMI basis least 3 days a week with additional doses as needed.      Recurrent pulmonary embolism (HCC) (Chronic)    Recurrent VTE-PE, on long-term Xarelto-made her more important by the fact that he is extremely immobilized.  We have therefore stopped antiplatelet agent and he is on aspirin only.  No bleeding issues.        COVID-19 Education: The signs and symptoms of COVID-19 were discussed with the patient and how to seek care for testing (follow up with PCP or arrange E-visit).   The importance of social distancing and COVID-19 vaccination was discussed today. 1 min The patient is practicing social distancing & Masking.   I spent a total of 45 minutes with the patient spent in direct patient consultation.  Additional time spent with chart review  / charting (studies, outside notes, etc): 10 Total Time: 55 min   Current medicines are reviewed at length with the patient today.  (+/- concerns) n/a  This visit occurred during the SARS-CoV-2 public health emergency.  Safety protocols were in place, including screening questions prior to the visit, additional usage of staff PPE, and extensive cleaning of exam room while observing appropriate contact time as indicated for disinfecting solutions.  Notice: This dictation was  prepared with Dragon dictation along with smaller phrase technology. Any transcriptional errors that result from this process are unintentional and may not be corrected upon review.  Patient Instructions / Medication Changes & Studies & Tests Ordered   Patient Instructions  Medication Instructions:  Continue same medications *If you need a refill on your cardiac medications before your next appointment, please call your pharmacy*   Lab Work: None ordered   Testing/Procedures: None ordered   Follow-Up: At [facility], you and your health needs are our priority.  As part of our continuing mission to provide you with exceptional heart care, we have created designated Provider Care Teams.  These Care Teams include your primary Cardiologist (physician) and Advanced Practice Providers (APPs -  Physician Assistants and Nurse Practitioners) who all work together to provide you with the care you need, when you need it.  We recommend signing up for the patient portal called "MyChart".  Sign up information is provided on this After Visit Summary.  MyChart is used  to connect with patients for Virtual Visits (Telemedicine).  Patients are able to view lab/test results, encounter notes, upcoming appointments, etc.  Non-urgent messages can be sent to your provider as well.   To learn more about what you can do with MyChart, go to [email or web address].    Your next appointment:  6 months    Monday 02/05/20 at 4:20 pm   The format for your next appointment: Office     Provider:  Dr.Chaley Castellanos      Studies Ordered:   No orders of the defined types were placed in this encounter.    Bryan Lemma, M.D., M.S. Interventional Cardiologist   Pager # [phone] Phone # [phone] [address] [place], [place] [address]   Thank you for choosing [facility] at [facility]!!

## 2020-08-19 ENCOUNTER — Other Ambulatory Visit: Payer: Self-pay | Admitting: Family Medicine

## 2020-08-25 ENCOUNTER — Encounter: Payer: Self-pay | Admitting: Cardiology

## 2020-08-25 MED ORDER — METOPROLOL TARTRATE 50 MG PO TABS: 50.0000 mg | ORAL_TABLET | Freq: Two times a day (BID) | ORAL | 3 refills | Status: DC

## 2020-08-25 NOTE — Assessment & Plan Note (Addendum)
Borderline blood pressure today.  Usually pretty controlled.  He has noted several medication interactions as well.    We will continue current meds.  He is on Lopressor and then PRN Lasix.  I recommended he takes Lasix in a more routine regimen -> at least to 3 days a week.  With additional doses as needed..  If additional blood pressure required, would probably convert from Lopressor to carvedilol.  HCTZ was stopped in the past, but not for a clear reason.  Simply because he was started on cardiac medications.

## 2020-08-25 NOTE — Assessment & Plan Note (Signed)
Recurrent VTE-PE, on long-term Xarelto-made her more important by the fact that he is extremely immobilized.  We have therefore stopped antiplatelet agent and he is on aspirin only.  No bleeding issues.

## 2020-08-25 NOTE — Assessment & Plan Note (Signed)
EF relatively stabilized, low normal after STEMI.  No signs or symptoms of heart failure per se.  He is on Lopressor and as needed Lasix. Did ask that he may take Lasix on a more STEMI basis least 3 days a week with additional doses as needed.

## 2020-08-25 NOTE — Assessment & Plan Note (Signed)
Thankfully, no further anginal symptoms.  He is on beta-blocker high-dose statin and aspirin.  Not on Plavix because of Xarelto.

## 2020-08-25 NOTE — Assessment & Plan Note (Signed)
Try to elevate feet when possible.  Continue support stockings.  Consider taking standing dose of Lasix at least 2 days a week and then additional dose as needed.  Likely related to venous stasis or muscle atrophy as opposed to being from CHF.

## 2020-08-31 DIAGNOSIS — G8250 Quadriplegia, unspecified: Secondary | ICD-10-CM | POA: Diagnosis not present

## 2020-08-31 DIAGNOSIS — S14104S Unspecified injury at C4 level of cervical spinal cord, sequela: Secondary | ICD-10-CM | POA: Diagnosis not present

## 2020-09-17 ENCOUNTER — Telehealth: Payer: Self-pay

## 2020-09-17 NOTE — Telephone Encounter (Signed)
I am okay using Thursday or Friday same day and then canceling if test is not back yet

## 2020-09-17 NOTE — Telephone Encounter (Signed)
Pt is having discoloration in urine and loose bowels. Pt only wants to see Dr. Durene Cal. Please advise how to schedule

## 2020-09-17 NOTE — Telephone Encounter (Signed)
See below for scheduling

## 2020-09-17 NOTE — Telephone Encounter (Signed)
Patient is calling back to check on status. Tried to set him up with another provider but he is adamant about only seeing Dr. Durene Cal.

## 2020-09-18 NOTE — Telephone Encounter (Signed)
Patient is scheduled for 09/21/20 

## 2020-09-18 NOTE — Telephone Encounter (Signed)
See below

## 2020-09-20 NOTE — Patient Instructions (Addendum)
  Health Maintenance Due  Topic Date Due  . INFLUENZA VACCINE declines for seasons 03/25/2020  . COVID-19 Vaccine (3 - Booster for [employer] series) Hasn't had booster yet.   [email or web address] 08/04/2020  . OPHTHALMOLOGY EXAM - Sign release of information at the check out desk for last exam from august 09/19/2020   Please stop by lab before you go- blood and urine If you have mychart- we will send your results within 3 business days of [place] receiving them.  If you do not have mychart- we will call you about results within 5 business days of [place] receiving them.  *please also note that you will see labs on mychart as soon as they post. I will later go in and write notes on them- will say "notes from Dr. Durene Cal"  Please let us know if new or worsening symptoms particularly pain or blood in urine  Recommended follow up: Return in about 4 months (around 01/19/2021) for physical or sooner if needed.

## 2020-09-20 NOTE — Progress Notes (Signed)
Phone [phone] In person visit   Subjective:   Franklin Hicks is a 70 y.o. year old very pleasant male patient who presents for/with See problem oriented charting Chief Complaint  Patient presents with  . Discoloration    In urine Sunday   . Diarrhea    Sunday    This visit occurred during the SARS-CoV-2 public health emergency.  Safety protocols were in place, including screening questions prior to the visit, additional usage of staff PPE, and extensive cleaning of exam room while observing appropriate contact time as indicated for disinfecting solutions.   Past Medical History-  Patient Active Problem List   Diagnosis Date Noted  . Recurrent pulmonary embolism (HCC)     Priority: High  . C4 spinal cord injury, sequela (HCC) 12/18/2017    Priority: High  . Tetraplegia (HCC) 12/18/2017    Priority: High  . Aortic aneurysm (HCC) 12/16/2017    Priority: High  . Cord compression (HCC) 12/13/2017    Priority: High  . Chronic combined systolic and diastolic heart failure (HCC) 10/09/2016    Priority: High  . CAD S/P percutaneous coronary angioplasty 09/20/2016    Priority: High  . Morbid obesity (HCC) 07/18/2013    Priority: High  . Diabetes mellitus type 2, controlled (HCC) 07/17/2010    Priority: High  . Bilateral lower extremity edema 12/08/2008    Priority: High  . Angioedema of lips 07/13/2013    Priority: Medium  . Hyperlipidemia associated with type 2 diabetes mellitus (HCC) 10/02/2011    Priority: Medium  . BPH with urinary obstruction 07/17/2010    Priority: Medium  . Gout 03/15/2007    Priority: Medium  . Hypertension associated with diabetes (HCC) 03/15/2007    Priority: Medium  . Pain of left heel     Priority: Low  . Primary osteoarthritis of knees, bilateral 01/11/2016    Priority: Low  . Erectile dysfunction 07/30/2015    Priority: Low  . Multinodular goiter 07/13/2013    Priority: Low  . SOB (shortness of breath) 10/02/2011    Priority: Low  .  NEUROPATHY, IDIOPATHIC PERIPHERAL NEC 05/31/2007    Priority: Low  . Osteoarthritis 03/22/2007    Priority: Low    Medications- reviewed and updated Current Outpatient Medications  Medication Sig Dispense Refill  . acetaminophen (TYLENOL) 325 MG tablet Take 1-2 tablets (325-650 mg total) by mouth every 4 (four) hours as needed for mild pain.    Marland Kitchen ammonium lactate (AMLACTIN) 12 % lotion Apply 1 application topically as needed for dry skin. 400 g 0  . aspirin EC 81 MG tablet Take 1 tablet (81 mg total) by mouth daily. 90 tablet 3  . atorvastatin (LIPITOR) 80 MG tablet Take 1 tablet (80 mg total) by mouth daily at 6 PM. 90 tablet 3  . diclofenac sodium (VOLTAREN) 1 % GEL Apply 1 application topically 3 (three) times daily. Both knees 3 Tube 4  . furosemide (LASIX) 20 MG tablet TAKE 1 TABLET BY MOUTH EVERY MONDAY, WEDNESDAY, AND FRIDAY. NEED OFFICE VISIT FOR MORE REFILLS 45 tablet 0  . metoprolol tartrate (LOPRESSOR) 50 MG tablet Take 1 tablet (50 mg total) by mouth 2 (two) times daily. 180 tablet 3  . Multiple Vitamin (MULTIVITAMIN WITH MINERALS) TABS tablet Take 1 tablet by mouth daily.    . Potassium Citrate 15 MEQ (1620 MG) TBCR Take 1 tablet by mouth 2 (two) times daily.  11  . terazosin (HYTRIN) 10 MG capsule Take 1 capsule (10 mg total)  by mouth at bedtime. 90 capsule 3  . XARELTO 20 MG TABS tablet TAKE 1 TABLET BY MOUTH EVERY DAY WITH SUPPER 30 tablet 3  . vitamin B-12 (CYANOCOBALAMIN) 100 MCG tablet Take 1 tablet (100 mcg total) by mouth daily. (Patient not taking: Reported on 09/21/2020)     No current facility-administered medications for this visit.     Objective:  BP 126/74   Pulse 77   Temp 98.7 F (37.1 C) (Temporal)   Ht 6' (1.829 m)   Wt (!) 399 lb (181 kg)   SpO2 96%   BMI 54.11 kg/m  Gen: NAD, resting comfortably CV: RRR no murmurs rubs or gallops Lungs: CTAB no crackles, wheeze, rhonchi Abdomen: soft/nontender/nondistended/normal bowel sounds. No rebound or  guarding.  Ext: stable edema under compression stockings- thickened skin Skin: warm, dry Neuro: wheelchair bound, weakness in upper extremities noted when lifting hand for fist bump     Assessment and Plan   #Urine Discoloration/loose stool S: several months ago perhaps in the summer noted some darker urine color. He thought perhaps dehydration- increased water and this seemed to clear up. A few days ago water intake was down and urine darkened again in the morning. Increased water intake and cleared up again. Ate some dark cherries a few days agoand next morning was dark again- almost brownish and last few days has cleared again until this AM and slightly darker but not as bad. No reddish discoloration to urine. No dysuria or abdominal or pain or fever. Overall feels normal. Overnight is most common time he notes issues. Perhaps slight twinge in back pain with laying down- very mild.   He noted some looser stools as well for at least 3 days. No blood in stool. No melena. So had a few BMs a day at first and now down to 1 but still loose.  A/P: with looser stools- will test for covid but do not strongly suspect.   For urine- get UA and urine culture. Has had stones before and remains on potassium citrate  % Cord compression/tetraplegia/sequela of C4 spinal cord injury- patient with severe injury April 2019- has used wheelchair since that time.  Is able to walk with his walker as well- trying to increase his steps. Follows with Dr. Hermelinda Medicus now as needed- no recent visits.   # Recurrent pulmonary embolism  S: Prior DVT 2014 after traveling- believe led to PE. Recurrent PE 11/2017 after fall. Now on lifelong anticoagulation-Xarelto 20 mg A/P: stable- continue current medicine  . #Chronic systolic and diastolic heart failure/hypertension S: Compliant lasix 3 days a week previously- hasnt had to use as much lately - was told did not have to use unless increased swelling, metoprolol 50mg  BID,  terazosin 10mg  (also helps with BPH). Takes potassium through urology  A/P:  For CHF- has been stable with Lasix 3 times a week in past- now only prn - very rare recently. Continue current meds HTN- controlled- continue current meds  #CAD/hyperlipidemia/aortic aneurysm. S: CAD followed by Dr. with history of NSTEMI.  Compliant with aspirin.  Also on Xarelto for recurrent PE.  Dr. follows aortic aneurysm yearly with imaging typically- with pandemic this has got pushed back- we will try to update now   Mild poorly controlled on atorvastatin 80mg  daily in the past-ideally LDL would be under 70 which it was April 2021 Lab Results  Component Value Date   CHOL 110 11/29/2019   HDL 33.30 (L) 11/29/2019   LDLCALC 61 11/29/2019  LDLDIRECT 72.0 10/09/2016   TRIG 80.0 11/29/2019   CHOLHDL 3 11/29/2019   A/P: CAD asymptomatic- continue current meds HLD- at goal- continue statin Aortic aneursym- will update this with   #screen prostate cancer- we did not check this last year- will do one final check as long as trend is reassuring per guidelines. Lab Results  Component Value Date   PSA 1.22 09/24/2017   PSA 1.23 12/26/2014   PSA 1.19 03/02/2013   # Diabetes/morbid obesity.  S:  controlled on no rx previously.  sent in metformin 500mg  daily #90 with 3 refills last year with plan for 1 month follow up but he did not see in may as planned- had planned to work on diet instead. Holidays were worse though For morbid obesity and diabetes exercise and diet- trying to watch his sugar and pasta.  Does some limited exercise around the house  Lab Results  Component Value Date   HGBA1C 8.2 (H) 11/29/2019  A/P: hopefully controlled- update a1c with labs   Recommended follow up: Return in about 4 months (around 01/19/2021) for physical or sooner if needed. Future Appointments  Date Time Provider Department Center  01/22/2021  3:00 PM 01/24/2021, MD [facility]  02/04/2021  4:20 PM  02/06/2021, MD [facility]    Lab/Order associations:   ICD-10-CM   1. Controlled type 2 diabetes mellitus without complication, without long-term current use of insulin (HCC)  E11.9 CBC with Differential/Platelet    Comprehensive metabolic panel    Hemoglobin A1c  2. Urine discoloration  R39.89 POCT Urinalysis Dipstick (Automated)    Urine Culture  3. Tetraplegia (HCC) Chronic G82.50   4. Recurrent pulmonary embolism (HCC) Chronic I26.99   5. Chronic combined systolic and diastolic heart failure (HCC) Chronic I50.42   6. Hypertension associated with diabetes (HCC) Chronic E11.59    I15.2   7. Morbid obesity (HCC) Chronic E66.01   8. Thoracic aortic aneurysm without rupture (HCC)  I71.2 CT ANGIO CHEST AORTA W/CM & OR WO/CM  9. Diarrhea, unspecified type  R19.7 Novel Coronavirus, NAA ([facility])  10. Nocturia  R35.1 PSA   Return precautions advised.  [facility], MD

## 2020-09-21 ENCOUNTER — Other Ambulatory Visit: Payer: Self-pay

## 2020-09-21 ENCOUNTER — Ambulatory Visit (INDEPENDENT_AMBULATORY_CARE_PROVIDER_SITE_OTHER): Payer: Medicare PPO | Admitting: Family Medicine

## 2020-09-21 ENCOUNTER — Encounter: Payer: Self-pay | Admitting: Family Medicine

## 2020-09-21 VITALS — BP 126/74 | HR 77 | Temp 98.7°F | Ht 72.0 in | Wt 399.0 lb

## 2020-09-21 DIAGNOSIS — E1159 Type 2 diabetes mellitus with other circulatory complications: Secondary | ICD-10-CM

## 2020-09-21 DIAGNOSIS — R351 Nocturia: Secondary | ICD-10-CM | POA: Diagnosis not present

## 2020-09-21 DIAGNOSIS — R197 Diarrhea, unspecified: Secondary | ICD-10-CM | POA: Diagnosis not present

## 2020-09-21 DIAGNOSIS — I712 Thoracic aortic aneurysm, without rupture, unspecified: Secondary | ICD-10-CM

## 2020-09-21 DIAGNOSIS — I5042 Chronic combined systolic (congestive) and diastolic (congestive) heart failure: Secondary | ICD-10-CM

## 2020-09-21 DIAGNOSIS — I2699 Other pulmonary embolism without acute cor pulmonale: Secondary | ICD-10-CM

## 2020-09-21 DIAGNOSIS — I152 Hypertension secondary to endocrine disorders: Secondary | ICD-10-CM

## 2020-09-21 DIAGNOSIS — R319 Hematuria, unspecified: Secondary | ICD-10-CM

## 2020-09-21 DIAGNOSIS — E119 Type 2 diabetes mellitus without complications: Secondary | ICD-10-CM | POA: Diagnosis not present

## 2020-09-21 DIAGNOSIS — G8250 Quadriplegia, unspecified: Secondary | ICD-10-CM | POA: Diagnosis not present

## 2020-09-21 DIAGNOSIS — R3989 Other symptoms and signs involving the genitourinary system: Secondary | ICD-10-CM

## 2020-09-21 LAB — COMPREHENSIVE METABOLIC PANEL
ALT: 19 U/L (ref 0–53)
AST: 18 U/L (ref 0–37)
Albumin: 3.4 g/dL — ABNORMAL LOW (ref 3.5–5.2)
Alkaline Phosphatase: 88 U/L (ref 39–117)
BUN: 22 mg/dL (ref 6–23)
CO2: 26 mEq/L (ref 19–32)
Calcium: 8.5 mg/dL (ref 8.4–10.5)
Chloride: 106 mEq/L (ref 96–112)
Creatinine, Ser: 1.23 mg/dL (ref 0.40–1.50)
GFR: 59.18 mL/min — ABNORMAL LOW (ref >60.00)
Glucose, Bld: 135 mg/dL — ABNORMAL HIGH (ref 70–99)
Potassium: 4.1 mEq/L (ref 3.5–5.1)
Sodium: 139 mEq/L (ref 135–145)
Total Bilirubin: 0.3 mg/dL (ref 0.2–1.2)
Total Protein: 6.5 g/dL (ref 6.0–8.3)

## 2020-09-21 LAB — POC URINALSYSI DIPSTICK (AUTOMATED)
Glucose, UA: NEGATIVE
Ketones, UA: NEGATIVE
Nitrite, UA: POSITIVE
Protein, UA: POSITIVE — AB
Spec Grav, UA: >=1.03 — AB (ref 1.010–1.025)
Urobilinogen, UA: 0.2 E.U./dL
pH, UA: 5 (ref 5.0–8.0)

## 2020-09-21 LAB — CBC WITH DIFFERENTIAL/PLATELET
Basophils Absolute: 0 10*3/uL (ref 0.0–0.1)
Basophils Relative: 0.2 % (ref 0.0–3.0)
Eosinophils Absolute: 0.1 10*3/uL (ref 0.0–0.7)
Eosinophils Relative: 1.5 % (ref 0.0–5.0)
HCT: 35.4 % — ABNORMAL LOW (ref 39.0–52.0)
Hemoglobin: 11.8 g/dL — ABNORMAL LOW (ref 13.0–17.0)
Lymphocytes Relative: 20 % (ref 12.0–46.0)
Lymphs Abs: 1.7 10*3/uL (ref 0.7–4.0)
MCHC: 33.4 g/dL (ref 30.0–36.0)
MCV: 89.4 fl (ref 78.0–100.0)
Monocytes Absolute: 0.7 10*3/uL (ref 0.1–1.0)
Monocytes Relative: 8 % (ref 3.0–12.0)
Neutro Abs: 5.9 10*3/uL (ref 1.4–7.7)
Neutrophils Relative %: 70.3 % (ref 43.0–77.0)
Platelets: 196 10*3/uL (ref 150.0–400.0)
RBC: 3.96 Mil/uL — ABNORMAL LOW (ref 4.22–5.81)
RDW: 15.1 % (ref 11.5–15.5)
WBC: 8.3 10*3/uL (ref 4.0–10.5)

## 2020-09-21 LAB — URINALYSIS, MICROSCOPIC ONLY

## 2020-09-21 LAB — HEMOGLOBIN A1C: Hgb A1c MFr Bld: 7.1 % — ABNORMAL HIGH (ref 4.6–6.5)

## 2020-09-21 LAB — PSA: PSA: 1.54 ng/mL (ref 0.10–4.00)

## 2020-09-21 NOTE — Addendum Note (Signed)
Addended by: Lurlean Horns on: 09/21/2020 02:37 PM   Modules accepted: Orders

## 2020-09-23 LAB — SARS-COV-2, NAA 2 DAY TAT

## 2020-09-23 LAB — NOVEL CORONAVIRUS, NAA: SARS-CoV-2, NAA: NOT DETECTED

## 2020-09-24 LAB — URINE CULTURE
MICRO NUMBER:: 11469912
SPECIMEN QUALITY:: ADEQUATE

## 2020-09-24 MED ORDER — CEPHALEXIN 500 MG PO CAPS: 500.0000 mg | ORAL_CAPSULE | Freq: Three times a day (TID) | ORAL | 0 refills | Status: DC

## 2020-09-24 NOTE — Addendum Note (Signed)
Addended by: Shelva Majestic on: 09/24/2020 12:42 PM   Modules accepted: Orders

## 2020-09-27 ENCOUNTER — Ambulatory Visit: Payer: Medicare PPO | Attending: Internal Medicine

## 2020-09-27 ENCOUNTER — Other Ambulatory Visit: Payer: Self-pay

## 2020-09-27 DIAGNOSIS — Z23 Encounter for immunization: Secondary | ICD-10-CM

## 2020-09-27 NOTE — Progress Notes (Signed)
   Covid-19 Vaccination Clinic  Name:  Franklin Hicks    MRN: [id number] DOB: August 16, 1951  09/27/2020  [name] was observed post Covid-19 immunization for 15 minutes without incident. He was provided with Vaccine Information Sheet and instruction to access the V-Safe system.   [name] was instructed to call 911 with any severe reactions post vaccine: Marland Kitchen Difficulty breathing  . Swelling of face and throat  . A fast heartbeat  . A bad rash all over body  . Dizziness and weakness   Immunizations Administered    Name Date Dose VIS Date Route   PFIZER Comrnaty(Gray TOP) Covid-19 Vaccine 09/27/2020  2:46 PM 0.3 mL 08/02/2020 Intramuscular   Manufacturer: [employer], [employer]   Lot: [id number]   NDC: [phone]

## 2020-10-01 DIAGNOSIS — G8250 Quadriplegia, unspecified: Secondary | ICD-10-CM | POA: Diagnosis not present

## 2020-10-01 DIAGNOSIS — S14104S Unspecified injury at C4 level of cervical spinal cord, sequela: Secondary | ICD-10-CM | POA: Diagnosis not present

## 2020-10-02 ENCOUNTER — Other Ambulatory Visit: Payer: Self-pay

## 2020-10-02 ENCOUNTER — Ambulatory Visit (INDEPENDENT_AMBULATORY_CARE_PROVIDER_SITE_OTHER)
Admission: RE | Admit: 2020-10-02 | Discharge: 2020-10-02 | Disposition: A | Discharge status: Home / Self Care | Payer: Medicare PPO | Source: Ambulatory Visit | Attending: Family Medicine | Admitting: Family Medicine

## 2020-10-02 DIAGNOSIS — I712 Thoracic aortic aneurysm, without rupture, unspecified: Secondary | ICD-10-CM

## 2020-10-02 DIAGNOSIS — I517 Cardiomegaly: Secondary | ICD-10-CM | POA: Diagnosis not present

## 2020-10-02 DIAGNOSIS — K8020 Calculus of gallbladder without cholecystitis without obstruction: Secondary | ICD-10-CM | POA: Diagnosis not present

## 2020-10-02 DIAGNOSIS — J9811 Atelectasis: Secondary | ICD-10-CM | POA: Diagnosis not present

## 2020-10-02 MED ORDER — IOHEXOL 350 MG/ML SOLN
100.0000 mL | Freq: Once | INTRAVENOUS | Status: AC | PRN
  Administered 2020-10-02: 100 mL via INTRAVENOUS

## 2020-10-29 DIAGNOSIS — G8250 Quadriplegia, unspecified: Secondary | ICD-10-CM | POA: Diagnosis not present

## 2020-10-29 DIAGNOSIS — S14104S Unspecified injury at C4 level of cervical spinal cord, sequela: Secondary | ICD-10-CM | POA: Diagnosis not present

## 2020-11-26 DIAGNOSIS — H5213 Myopia, bilateral: Secondary | ICD-10-CM | POA: Diagnosis not present

## 2020-11-26 DIAGNOSIS — H52203 Unspecified astigmatism, bilateral: Secondary | ICD-10-CM | POA: Diagnosis not present

## 2020-11-26 DIAGNOSIS — E119 Type 2 diabetes mellitus without complications: Secondary | ICD-10-CM | POA: Diagnosis not present

## 2020-11-26 DIAGNOSIS — H40023 Open angle with borderline findings, high risk, bilateral: Secondary | ICD-10-CM | POA: Diagnosis not present

## 2020-11-26 LAB — HM DIABETES EYE EXAM

## 2020-11-27 ENCOUNTER — Encounter: Payer: Self-pay | Admitting: Family Medicine

## 2020-11-27 ENCOUNTER — Other Ambulatory Visit: Payer: Self-pay

## 2020-11-27 DIAGNOSIS — Z1211 Encounter for screening for malignant neoplasm of colon: Secondary | ICD-10-CM

## 2020-11-29 DIAGNOSIS — S14104S Unspecified injury at C4 level of cervical spinal cord, sequela: Secondary | ICD-10-CM | POA: Diagnosis not present

## 2020-11-29 DIAGNOSIS — G8250 Quadriplegia, unspecified: Secondary | ICD-10-CM | POA: Diagnosis not present

## 2020-12-29 DIAGNOSIS — S14104S Unspecified injury at C4 level of cervical spinal cord, sequela: Secondary | ICD-10-CM | POA: Diagnosis not present

## 2020-12-29 DIAGNOSIS — G8250 Quadriplegia, unspecified: Secondary | ICD-10-CM | POA: Diagnosis not present

## 2021-01-03 ENCOUNTER — Other Ambulatory Visit: Payer: Self-pay | Admitting: Family Medicine

## 2021-01-16 ENCOUNTER — Encounter (HOSPITAL_COMMUNITY): Payer: Self-pay

## 2021-01-16 ENCOUNTER — Emergency Department (HOSPITAL_COMMUNITY)
Admission: EM | Admit: 2021-01-16 | Discharge: 2021-01-16 | Disposition: A | Discharge status: Home / Self Care | Payer: Medicare PPO | Attending: Emergency Medicine | Admitting: Emergency Medicine

## 2021-01-16 ENCOUNTER — Emergency Department (HOSPITAL_COMMUNITY): Payer: Medicare PPO

## 2021-01-16 DIAGNOSIS — S52501A Unspecified fracture of the lower end of right radius, initial encounter for closed fracture: Secondary | ICD-10-CM | POA: Insufficient documentation

## 2021-01-16 DIAGNOSIS — Z7982 Long term (current) use of aspirin: Secondary | ICD-10-CM | POA: Diagnosis not present

## 2021-01-16 DIAGNOSIS — I5042 Chronic combined systolic (congestive) and diastolic (congestive) heart failure: Secondary | ICD-10-CM | POA: Insufficient documentation

## 2021-01-16 DIAGNOSIS — R21 Rash and other nonspecific skin eruption: Secondary | ICD-10-CM | POA: Diagnosis not present

## 2021-01-16 DIAGNOSIS — Y92410 Unspecified street and highway as the place of occurrence of the external cause: Secondary | ICD-10-CM | POA: Diagnosis not present

## 2021-01-16 DIAGNOSIS — E1169 Type 2 diabetes mellitus with other specified complication: Secondary | ICD-10-CM | POA: Diagnosis not present

## 2021-01-16 DIAGNOSIS — M25531 Pain in right wrist: Secondary | ICD-10-CM | POA: Diagnosis not present

## 2021-01-16 DIAGNOSIS — Z7901 Long term (current) use of anticoagulants: Secondary | ICD-10-CM | POA: Diagnosis not present

## 2021-01-16 DIAGNOSIS — I2510 Atherosclerotic heart disease of native coronary artery without angina pectoris: Secondary | ICD-10-CM | POA: Diagnosis not present

## 2021-01-16 DIAGNOSIS — E785 Hyperlipidemia, unspecified: Secondary | ICD-10-CM | POA: Insufficient documentation

## 2021-01-16 DIAGNOSIS — Z79899 Other long term (current) drug therapy: Secondary | ICD-10-CM | POA: Insufficient documentation

## 2021-01-16 DIAGNOSIS — R609 Edema, unspecified: Secondary | ICD-10-CM | POA: Diagnosis not present

## 2021-01-16 DIAGNOSIS — I110 Hypertensive heart disease with heart failure: Secondary | ICD-10-CM | POA: Diagnosis not present

## 2021-01-16 DIAGNOSIS — Z87891 Personal history of nicotine dependence: Secondary | ICD-10-CM | POA: Diagnosis not present

## 2021-01-16 DIAGNOSIS — R079 Chest pain, unspecified: Secondary | ICD-10-CM | POA: Diagnosis not present

## 2021-01-16 DIAGNOSIS — S6991XA Unspecified injury of right wrist, hand and finger(s), initial encounter: Secondary | ICD-10-CM | POA: Diagnosis not present

## 2021-01-16 DIAGNOSIS — S52611A Displaced fracture of right ulna styloid process, initial encounter for closed fracture: Secondary | ICD-10-CM

## 2021-01-16 DIAGNOSIS — I517 Cardiomegaly: Secondary | ICD-10-CM | POA: Diagnosis not present

## 2021-01-16 MED ORDER — ACETAMINOPHEN 500 MG PO TABS
1000.0000 mg | ORAL_TABLET | Freq: Once | ORAL | Status: AC
  Administered 2021-01-16: 1000 mg via ORAL
  Filled 2021-01-16: qty 2

## 2021-01-16 MED ORDER — LIDOCAINE HCL (PF) 1 % IJ SOLN
10.0000 mL | Freq: Once | INTRAMUSCULAR | Status: DC
  Filled 2021-01-16: qty 10
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
## 2021-01-17 ENCOUNTER — Other Ambulatory Visit: Payer: Self-pay

## 2021-01-17 ENCOUNTER — Telehealth: Payer: Self-pay | Admitting: Plastic Surgery

## 2021-01-17 ENCOUNTER — Ambulatory Visit (INDEPENDENT_AMBULATORY_CARE_PROVIDER_SITE_OTHER): Payer: Medicare PPO | Admitting: Plastic Surgery

## 2021-01-17 ENCOUNTER — Telehealth: Payer: Self-pay

## 2021-01-17 DIAGNOSIS — S52501A Unspecified fracture of the lower end of right radius, initial encounter for closed fracture: Secondary | ICD-10-CM

## 2021-01-17 NOTE — Telephone Encounter (Signed)
Left message for surgery scheduler they will need to reach out to PCP in order to obtain clearance for Xarelto. PCP manages Xarelto for recurrent DVT/PE per pre op provider. Left message; pre op cardiologist is reviewing clearance request for cardiac clearance. Once pt has been given cardiac clearance, we will fax clearance notes to surgeon's office.

## 2021-01-17 NOTE — Telephone Encounter (Signed)
   Patient Name: Franklin Hicks  DOB: 01-27-1951  MRN: [id number]   Primary Cardiologist: None  Chart reviewed as part of pre-operative protocol coverage. Called patient to risk stratify from a cardiac perspective. He could not talk at this time as he was attempting to get to an appointment and said he would call us back. If we do not hear back from him today we will attempt to call him again on 5/27.   Eula Listen, PA-C 01/17/2021, 1:29 PM

## 2021-01-17 NOTE — Telephone Encounter (Signed)
Surgical Clearance faxed to PCP at 4:15pm.

## 2021-01-17 NOTE — Telephone Encounter (Signed)
Franklin Hicks with [facility] advising they received the cardiac clearance request and the cardiologist is reviewing it. They will reach out PCP regarding Xarelto.

## 2021-01-17 NOTE — Telephone Encounter (Signed)
   Patient Name: Franklin Hicks  DOB: 24-Oct-1950  MRN: [id number]   Primary Cardiologist: None  Chart reviewed as part of pre-operative protocol coverage. Received urgent preop request for ORIF for a right distal radius fracture. The patient is on Xarelto for recurrent DVT/PE, which is managed by his PCP. Please notify the surgical office to contact the PCP for recommendations on holding Xarelto. I will contact the patient to risk stratify from a cardiac perspective only.    Eula Listen, PA-C 01/17/2021, 1:24 PM

## 2021-01-17 NOTE — Telephone Encounter (Signed)
   [facility] Pre-operative Risk Assessment    Patient Name: Franklin Hicks  DOB: 1950/12/29  MRN: [id number]  Request for surgical clearance:  1. What type of surgery is being performed? ORIF RIGHT DISTAL RADIUS FRACTURE   2. When is this surgery scheduled? ASAP   3. What type of clearance is required (medical clearance vs. Pharmacy clearance to hold med vs. Both)? BOTH  4. Are there any medications that need to be held prior to surgery and how long?[place]   5. Practice name and name of physician performing surgery? [facility] DR Silverio Lay [facility] ATTN:  [place]   6. What is the office phone number? [phone]   7.   What is the office fax number? [phone]  8.   Anesthesia type (None, local, MAC, general) ? GENERAL
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
## 2021-01-18 ENCOUNTER — Telehealth: Payer: Self-pay | Admitting: *Deleted

## 2021-01-18 ENCOUNTER — Telehealth: Payer: Self-pay | Admitting: Plastic Surgery

## 2021-01-18 NOTE — Telephone Encounter (Signed)
Franklin Hicks has e-mailed completed form to [facility] at Plastic Surgery.

## 2021-01-18 NOTE — Telephone Encounter (Signed)
Received fax from Plastic Surgery for surgical clearance for pt . Dr. Durene Cal filled out form tried to fax over to office kept saying busy.  Called office and verified fax number and then spoke to [facility], verbal order given pt is cleared for surgery per Dr. Durene Cal. Asked her if she had a different fax number I have been trying all morning and again this afternoon keeps saying busy. Osvaldo Human said no, but I can e-mail to her Carlena Sax has her e-mail. Told her okay I will have Carlena Sax send it over for me. Shayna verbalized understanding.

## 2021-01-18 NOTE — Telephone Encounter (Signed)
Received call from Lupita Leash at Dr. Erasmo Leventhal office. Mr. Goynes has been cleared for surgery. Hold Xarelto starting 6/2 and can resume taking 6/4. Surgery clearance will be faxed or emailed to our office. Dr. Arita Miss and Octavio Graves are being made aware of this information.

## 2021-01-22 ENCOUNTER — Telehealth: Payer: Self-pay

## 2021-01-22 ENCOUNTER — Encounter: Payer: Medicare PPO | Admitting: Family Medicine
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
## 2021-01-22 NOTE — Telephone Encounter (Signed)
Called and lm on pt vm tcb. 

## 2021-01-22 NOTE — Telephone Encounter (Signed)
On the clearance form I stated-"Hold Xarelto starting 6/2 and can resume taking 6/4" I also stated if any signs or symptoms of DVT or PE to let me know immediately-would do treatment dose for 3 weeks

## 2021-01-22 NOTE — Telephone Encounter (Signed)
Call to pt at the following phone numbers: [phone] & [phone]  No answer/unable to leave voicemail on either phone.pt is scheduled for surgery with Dr. Arita Miss on 01/25/21.  He has medication modifications that are necessary for the procedure

## 2021-01-22 NOTE — Telephone Encounter (Signed)
Patient is requesting a call back regarding medication prior to his surgery on Friday Please call asap. Thank you
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
## 2021-01-24 NOTE — Telephone Encounter (Signed)
I already responded on a separate phone thread.  I believe I overheard patient may be declining surgery

## 2021-01-24 NOTE — Telephone Encounter (Signed)
Spoke with the patient he states that he is no longer getting the surgery done and he will continue taking his medication the way they have been prescribed.

## 2021-01-24 NOTE — Telephone Encounter (Signed)
    Franklin Hicks DOB:  [birth year]  MRN:  [id number]   Primary Cardiologist: Bryan Lemma, MD  Chart reviewed as part of pre-operative protocol coverage. Given past medical history and time since last visit, based on ACC/AHA guidelines, Franklin Hicks would be at acceptable risk for the planned procedure without further cardiovascular testing.   Per Dr. Herbie Baltimore, "I last saw Franklin Hicks about 6 months ago. He is pretty stable at that time from a cardiac standpoint. Very difficult to assess because he is extremely limited mobility because of his spinal cord syndrome.   The planned surgery is relatively low risk. He had single-vessel disease treated with a DES stent almost 4 years ago. No heart failure symptoms, he has occasional palpitations but has never had any documented arrhythmia.    He has some mild edema which is probably more venous stasis related. No real PND orthopnea. Nothing to suggest CHF symptoms. Has low normal EF. Main concern will be to avoid volume overload.   I think he is relatively low risk last 1-2 risk. I do not think that doing a stress test or anything will be warranted because it would not change management.   Continue beta-blocker. Decision about when to stop and restart DOAC would be deferred to PCP."  The patient was advised that if he develops new symptoms prior to surgery to contact our office to arrange for a follow-up visit, and he verbalized understanding.  I will route this recommendation to the requesting party via Epic fax function and remove from pre-op pool.  Please call with questions.  Alver Sorrow, NP 01/24/2021, 8:11 AM

## 2021-01-25 ENCOUNTER — Ambulatory Visit: Admit: 2021-01-25 | Payer: Medicare PPO | Admitting: Plastic Surgery

## 2021-01-25 SURGERY — OPEN REDUCTION INTERNAL FIXATION (ORIF) RADIAL FRACTURE
Anesthesia: Choice | Site: Wrist | Laterality: Right

## 2021-01-28 ENCOUNTER — Telehealth: Payer: Self-pay

## 2021-01-28 NOTE — Telephone Encounter (Signed)
Surgery cancelled.  Treating non op.  Yes to splint. No ROM.  Thanks.

## 2021-01-28 NOTE — Telephone Encounter (Signed)
Hi Dr. Arita Miss,   Corrion Stirewalt is scheduled for O.T. this Wednesday to make a volar wrist splint. I noticed that he cancelled his surgery. Do you want to proceed with volar wrist splint? Can he begin any ROM to wrist or should we hold off?   Thanks,  Jene Every, OTR/L

## 2021-01-30 ENCOUNTER — Telehealth: Payer: Self-pay

## 2021-01-30 ENCOUNTER — Ambulatory Visit: Payer: Medicare PPO | Attending: Plastic Surgery | Admitting: Occupational Therapy

## 2021-01-30 ENCOUNTER — Other Ambulatory Visit: Payer: Self-pay

## 2021-01-30 DIAGNOSIS — M6281 Muscle weakness (generalized): Secondary | ICD-10-CM

## 2021-01-30 DIAGNOSIS — R278 Other lack of coordination: Secondary | ICD-10-CM | POA: Diagnosis not present

## 2021-01-30 DIAGNOSIS — M25631 Stiffness of right wrist, not elsewhere classified: Secondary | ICD-10-CM

## 2021-01-30 DIAGNOSIS — R600 Localized edema: Secondary | ICD-10-CM | POA: Diagnosis not present

## 2021-01-30 DIAGNOSIS — M25531 Pain in right wrist: Secondary | ICD-10-CM

## 2021-01-30 NOTE — Patient Instructions (Signed)
  WEARING SCHEDULE:  Wear splint at ALL times except for hygiene care  PURPOSE:  To prevent movement and for protection until injury can heal  CARE OF SPLINT:  Keep splint away from heat sources including: stove, radiator or furnace, or a car in sunlight. The splint can melt and will no longer fit you properly  Keep away from pets and children  Clean the splint with rubbing alcohol 1-2 times per day.  * During this time, make sure you also clean your hand/arm as instructed by your therapist and/or perform dressing changes as needed. Then dry hand/arm completely before replacing splint. (When cleaning hand/arm, keep it immobilized in same position until splint is replaced)  PRECAUTIONS/POTENTIAL PROBLEMS: *If you notice or experience increased pain, swelling, numbness, or a lingering reddened area from the splint: Contact your therapist immediately by calling [phone]. You must wear the splint for protection, but we will get you scheduled for adjustments as quickly as possible.  (If only straps or hooks need to be replaced and NO adjustments to the splint need to be made, just call the office ahead and let them know you are coming in)  If you have any medical concerns or signs of infection, please call your doctor immediately    EXERCISES: (with splint ON!!) Do each 10 reps, 3 times per day  1) Move entire arm up and down in front of you for shoulder motion (try and keep thumb side up)  2) Bend elbow up and down  3) Rotate palm up towards face w/ elbow bent  4) Make a gentle fist and open hand   5) Touch thumb to each finger (may not be able to get to small finger due to splint)
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
## 2021-01-30 NOTE — Telephone Encounter (Signed)
You may fax this. Use the fracture as diagnosis code
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
## 2021-01-31 NOTE — Telephone Encounter (Signed)
This has been faxed.

## 2021-02-04 ENCOUNTER — Other Ambulatory Visit: Payer: Self-pay

## 2021-02-04 ENCOUNTER — Encounter: Payer: Self-pay | Admitting: Cardiology

## 2021-02-04 ENCOUNTER — Ambulatory Visit (INDEPENDENT_AMBULATORY_CARE_PROVIDER_SITE_OTHER): Payer: Medicare PPO | Admitting: Cardiology

## 2021-02-04 VITALS — BP 134/75 | HR 72 | Ht 72.0 in | Wt 390.0 lb

## 2021-02-04 DIAGNOSIS — I5042 Chronic combined systolic (congestive) and diastolic (congestive) heart failure: Secondary | ICD-10-CM | POA: Diagnosis not present

## 2021-02-04 DIAGNOSIS — R600 Localized edema: Secondary | ICD-10-CM | POA: Diagnosis not present

## 2021-02-04 DIAGNOSIS — I2699 Other pulmonary embolism without acute cor pulmonale: Secondary | ICD-10-CM | POA: Diagnosis not present

## 2021-02-04 DIAGNOSIS — Z01810 Encounter for preprocedural cardiovascular examination: Secondary | ICD-10-CM | POA: Diagnosis not present

## 2021-02-04 DIAGNOSIS — E1169 Type 2 diabetes mellitus with other specified complication: Secondary | ICD-10-CM

## 2021-02-04 DIAGNOSIS — E1159 Type 2 diabetes mellitus with other circulatory complications: Secondary | ICD-10-CM | POA: Diagnosis not present

## 2021-02-04 DIAGNOSIS — E785 Hyperlipidemia, unspecified: Secondary | ICD-10-CM

## 2021-02-04 DIAGNOSIS — I152 Hypertension secondary to endocrine disorders: Secondary | ICD-10-CM

## 2021-02-04 DIAGNOSIS — Z9861 Coronary angioplasty status: Secondary | ICD-10-CM

## 2021-02-04 DIAGNOSIS — I2510 Atherosclerotic heart disease of native coronary artery without angina pectoris: Secondary | ICD-10-CM

## 2021-02-04 NOTE — Patient Instructions (Signed)
Medication Instructions:  No changes *If you need a refill on your cardiac medications before your next appointment, please call your pharmacy*   Lab Work: Lipid Cmp HgbA1c fasting  If you have labs (blood work) drawn today and your tests are completely normal, you will receive your results only by: MyChart Message (if you have MyChart) OR A paper copy in the mail If you have any lab test that is abnormal or we need to change your treatment, we will call you to review the results.   Testing/Procedures: Not needed   Follow-Up: At [facility], you and your health needs are our priority.  As part of our continuing mission to provide you with exceptional heart care, we have created designated Provider Care Teams.  These Care Teams include your primary Cardiologist (physician) and Advanced Practice Providers (APPs -  Physician Assistants and Nurse Practitioners) who all work together to provide you with the care you need, when you need it.  We recommend signing up for the patient portal called "MyChart".  Sign up information is provided on this After Visit Summary.  MyChart is used to connect with patients for Virtual Visits (Telemedicine).  Patients are able to view lab/test results, encounter notes, upcoming appointments, etc.  Non-urgent messages can be sent to your provider as well.   To learn more about what you can do with MyChart, go to [email or web address].    Your next appointment:    6 to 7 month(s)  The format for your next appointment:   In Person  Provider:   Bryan Lemma, MD   Other Instructions From cardiac standpoint you can have surgery on your wrist
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
## 2021-02-05 ENCOUNTER — Encounter: Payer: Self-pay | Admitting: Cardiology

## 2021-02-05 DIAGNOSIS — Z01810 Encounter for preprocedural cardiovascular examination: Secondary | ICD-10-CM | POA: Insufficient documentation

## 2021-02-05 NOTE — Assessment & Plan Note (Signed)
Recurrent PE on lifelong DOAC.  If he does have surgery, this would need to be held 2 days preop not 4 days.  Xarelto is completely out of the system after 48 hours.  Would simply restart when safe postop.-->  Preferably the same evening to avoid potential DVT while in active postop.

## 2021-02-05 NOTE — Assessment & Plan Note (Signed)
It seems that he declined having wrist surgery because there was some concern about potential cardiovascular risk.  He has had a heart attack, but was completely revascularized and has not had any further angina.  He has a very minimally reduced EF and no active heart failure.  Edema is not related to CHF.  He has well-controlled blood pressure and is on moderate dose beta-blocker which mitigates risk.  Does not carry diagnosis of diabetes despite having elevated A1c.  Regardless, he is not on insulin.  He has normal renal function.  PREOPERATIVE CARDIAC RISK ASSESSMENT   Revised Cardiac Risk Index:  High Risk Surgery: no; wrist surgery is very low risk from a cardiovascular standpoint  Defined as Intraperitoneal, intrathoracic or suprainguinal vascular  Active CAD: no; no active chest pain symptoms.  CHF: no; he simply has some diastolic dysfunction and minimally reduced EF.  No active CHF symptoms.  Cerebrovascular Disease: no;   Diabetes: yes; On Insulin: no  CKD (Cr >~ 2): no; creatinine is 1.23  Total: 0 Estimated Risk of Adverse Outcome: Class I RISK Estimated Risk of MI, PE, VF/VT (Cardiac Arrest), Complete Heart Block:  roughly 3.9%-with most risk being related to his obesity and deconditioning --> with presence of longstanding beta-blocker treatment mitigates this risk reducing it to one half at risk.  Would be roughly 1.9%   ACC/AHA Guidelines for "Clearance":  Step 1 - Need for Emergency Surgery: No: However would be somewhat time sensitive now since we are couple weeks out.  If Yes - go straight to OR with perioperative surveillance  Step 2 - Active Cardiac Conditions (Unstable Angina, Decompensated HF, Significant  Arrhytmias - Complete HB, Mobitz II, Symptomatic VT or SVT, Severe Aortic Stenosis - mean gradient > 40 mmHg, Valve area < 1.0 cm2):   No:   If Yes - Evaluate & Treat per ACC/AHA Guidelines  Step 3 -  Low Risk Surgery: Yes  If Yes --> proceed to  OR  If No --> Step 4  Step 4 - Functional Capacity >= 4 METS without symptoms: No: Mostly wheelchair-bound.  If Yes --> proceed to OR  If No --> Step 5  Step 5 --  Clinical Risk Factors (CRF) - 0 see above  No CRFs: Yes  If Yes --> Proceed to OR with HR control, or consider testing if it will change management --> in the absence of symptoms, stress testing would not change management.  No active CHF, therefore I do not think an echocardiogram would be all that helpful either.  Based on my review of his clinical situation, he would be a relatively low risk patient from a cardiac standpoint for surgery.  Potentially more concerning risk because his body habitus with possible OSA due to weight.  However I do not think a low risk radial surgery should be a major issue.  If there is concern from a surgical or anesthesia standpoint, would recommend performing the surgery at [facility] with [place] cardiology backup. My recommendation would be to proceed to the OR if he chooses to do so.  I think it is reasonable to do especially if there is concern about loss of mobility with the rest.  It is very crucial for him to have a stable wrist for him to use his walker to walk.

## 2021-02-05 NOTE — Assessment & Plan Note (Signed)
Made worse by the fact that he is now wheelchair-bound.  Probably has highest overall risk from a surgical standpoint would be his weight.  Has not been diagnosed with OSA.  With him having limited mobility, dietary modification and adjustment is very important

## 2021-02-05 NOTE — Assessment & Plan Note (Signed)
Single-vessel CAD with stent placed in the RAMUS INTERMEDIUS in 2018.  He has had no further chest pain or pressure since then.  No further ischemic evaluation.  He is on aspirin and Xarelto (related to PE).  Aspirin actually can be held if necessary. Xarelto can also be held for procedures.  He is on high-dose atorvastatin along with beta-blocker.  Other than the ramus, he had no other significant disease.

## 2021-02-05 NOTE — Assessment & Plan Note (Addendum)
Due for annual follow-up labs now.  Last LDL in April 2021 was 61.  For now we will continue with atorvastatin 80 mg (I think he is taking half tablet daily).  As long as he is not having symptoms.  Low threshold to consider moving away from atorvastatin and toward Crestor 40 mg.  Last A1c was 7.1.  Relatively well controlled for not being on any medications.  Will defer to PCP, but low threshold to consider GLP-1 agonist.  Recheck lipid panel along with A1c, chemistries and CBC

## 2021-02-05 NOTE — Assessment & Plan Note (Signed)
Mild CHF symptoms, more of his dyspnea is related to obesity.  EF is just below the lower limit of normal.  He really does not have true CHF symptoms.  Considered class I at worst.  Edema seems to be more related to venous stasis and body habitus.

## 2021-02-05 NOTE — Assessment & Plan Note (Signed)
Continue support stockings.  He is not currently on furosemide although he had previously been on it.  Trying to avoid dehydration.  No longer having dizziness with removal of standing diuretic.  We may need to go into doing PRN dosing.  I suspect the edema is not related to CHF, more related to muscle atrophy and venous stasis.

## 2021-02-07 ENCOUNTER — Other Ambulatory Visit: Payer: Self-pay

## 2021-02-07 ENCOUNTER — Ambulatory Visit (INDEPENDENT_AMBULATORY_CARE_PROVIDER_SITE_OTHER): Payer: Medicare PPO | Admitting: Plastic Surgery

## 2021-02-07 DIAGNOSIS — S52501A Unspecified fracture of the lower end of right radius, initial encounter for closed fracture: Secondary | ICD-10-CM | POA: Diagnosis not present
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
## 2021-02-09 ENCOUNTER — Other Ambulatory Visit: Payer: Self-pay | Admitting: Family Medicine

## 2021-02-11 ENCOUNTER — Ambulatory Visit: Payer: Medicare PPO | Admitting: Occupational Therapy

## 2021-02-11 ENCOUNTER — Other Ambulatory Visit: Payer: Self-pay

## 2021-02-11 ENCOUNTER — Other Ambulatory Visit: Payer: Self-pay | Admitting: Family Medicine

## 2021-02-11 DIAGNOSIS — M25531 Pain in right wrist: Secondary | ICD-10-CM

## 2021-02-11 DIAGNOSIS — R600 Localized edema: Secondary | ICD-10-CM | POA: Diagnosis not present

## 2021-02-11 DIAGNOSIS — M6281 Muscle weakness (generalized): Secondary | ICD-10-CM | POA: Diagnosis not present

## 2021-02-11 DIAGNOSIS — R278 Other lack of coordination: Secondary | ICD-10-CM

## 2021-02-11 DIAGNOSIS — M25631 Stiffness of right wrist, not elsewhere classified: Secondary | ICD-10-CM
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
## 2021-02-11 NOTE — Patient Instructions (Signed)
  Flexor Tendon Gliding (Active Hook Fist)   With fingers and knuckles straight, bend middle and tip joints. Do not bend large knuckles. Repeat _10-15___ times. Do _6___ sessions per day.  MP Flexion (Active)   With back of hand on table, bend large knuckles as far as they will go, keeping small joints straight. Repeat _10-15___ times. Do __6__ sessions per day. Activity: Reach into a narrow container.*      Finger Flexion / Extension   With palm up, bend fingers of left hand toward palm, making a  fist. Straighten fingers, opening fist. Repeat sequence _10-15___ times per session. Do _6__ sessions per day.   Opposition (Active)   Touch tip of thumb to nail tip of each finger in turn, making an "O" shape. Repeat __10__ times. Do _4-6___ sessions per day.   MP Flexion (Active)   Bend thumb to touch base of little finger, keeping tip joint straight. Repeat __10-15__ times. Do _4-6___ sessions per day.     Composite Extension (Active)   Bring thumb up and out in hitchhiker position.  Repeat __10-15__ times. Do _4-6___ sessions per day.   AROM: Thumb Abduction / Adduction    Actively pull right thumb away from palm as far as possible. Then bring thumb back to touch fingers. Try not to bend fingers toward thumb. Repeat _10-15___ times per set. Do _6___ sessions per day.

## 2021-02-12 ENCOUNTER — Ambulatory Visit: Payer: Medicare PPO | Admitting: Family Medicine

## 2021-02-12 ENCOUNTER — Ambulatory Visit (INDEPENDENT_AMBULATORY_CARE_PROVIDER_SITE_OTHER): Payer: Medicare PPO

## 2021-02-12 VITALS — BP 158/90 | HR 71 | Ht 72.0 in

## 2021-02-12 DIAGNOSIS — M25552 Pain in left hip: Secondary | ICD-10-CM

## 2021-02-12 NOTE — Patient Instructions (Signed)
Thank you for coming in today.   Please get an Xray today before you leave   I've referred you to Physical Therapy.  Let us know if you don't hear from them in one week.   Recheck in 1 month.
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
## 2021-02-14 ENCOUNTER — Ambulatory Visit: Payer: Medicare PPO | Admitting: Occupational Therapy

## 2021-02-14 NOTE — Progress Notes (Signed)
Left hip x-ray shows arthritis without fracture.

## 2021-02-18 ENCOUNTER — Ambulatory Visit: Payer: Medicare PPO | Admitting: Rehabilitative and Restorative Service Providers"

## 2021-02-18 ENCOUNTER — Encounter: Payer: Self-pay | Admitting: Rehabilitative and Restorative Service Providers"

## 2021-02-18 ENCOUNTER — Other Ambulatory Visit: Payer: Self-pay

## 2021-02-18 ENCOUNTER — Encounter: Payer: Medicare PPO | Admitting: Occupational Therapy

## 2021-02-18 DIAGNOSIS — M79605 Pain in left leg: Secondary | ICD-10-CM

## 2021-02-18 DIAGNOSIS — M6281 Muscle weakness (generalized): Secondary | ICD-10-CM

## 2021-02-18 DIAGNOSIS — M25552 Pain in left hip: Secondary | ICD-10-CM

## 2021-02-18 DIAGNOSIS — R262 Difficulty in walking, not elsewhere classified: Secondary | ICD-10-CM | POA: Diagnosis not present
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
## 2021-02-20 ENCOUNTER — Encounter: Payer: Medicare PPO | Admitting: Occupational Therapy

## 2021-02-28 ENCOUNTER — Other Ambulatory Visit (HOSPITAL_BASED_OUTPATIENT_CLINIC_OR_DEPARTMENT_OTHER): Payer: Self-pay

## 2021-02-28 ENCOUNTER — Ambulatory Visit: Payer: Medicare PPO | Attending: Internal Medicine

## 2021-02-28 ENCOUNTER — Other Ambulatory Visit: Payer: Self-pay

## 2021-02-28 DIAGNOSIS — Z23 Encounter for immunization: Secondary | ICD-10-CM

## 2021-02-28 MED ORDER — PFIZER-BIONT COVID-19 VAC-TRIS 30 MCG/0.3ML IM SUSP
INTRAMUSCULAR | 0 refills | Status: DC
  Filled 2021-02-28: qty 0.3, 1d supply, fill #0

## 2021-03-04 ENCOUNTER — Telehealth: Payer: Self-pay

## 2021-03-04 ENCOUNTER — Ambulatory Visit: Payer: Medicare PPO | Admitting: Occupational Therapy

## 2021-03-04 NOTE — Telephone Encounter (Signed)
-----   Message from Shelva Majestic, MD sent at 03/04/2021 12:48 PM EDT ----- Please investigate ----- Message ----- From: SYSTEM Sent: 12/02/2020  12:12 AM EDT To: Shelva Majestic, MD

## 2021-03-04 NOTE — Telephone Encounter (Signed)
Mychart message sent to patient.

## 2021-03-11 ENCOUNTER — Other Ambulatory Visit: Payer: Self-pay

## 2021-03-11 ENCOUNTER — Encounter: Payer: Self-pay | Admitting: Rehabilitative and Restorative Service Providers"

## 2021-03-11 ENCOUNTER — Ambulatory Visit: Payer: Medicare PPO | Admitting: Rehabilitative and Restorative Service Providers"

## 2021-03-11 DIAGNOSIS — M25552 Pain in left hip: Secondary | ICD-10-CM | POA: Diagnosis not present

## 2021-03-11 DIAGNOSIS — R262 Difficulty in walking, not elsewhere classified: Secondary | ICD-10-CM

## 2021-03-11 DIAGNOSIS — M6281 Muscle weakness (generalized): Secondary | ICD-10-CM

## 2021-03-11 DIAGNOSIS — M79605 Pain in left leg: Secondary | ICD-10-CM | POA: Diagnosis not present

## 2021-03-11 NOTE — Therapy (Signed)
[facility] Physical Therapy [address] [place], [place], [address] Phone: [phone]   Fax:  [phone]  Physical Therapy Treatment  Patient Details  Name: Franklin Hicks MRN: [id number] Date of Birth: Dec 26, 1950 Referring Provider (PT): Dr. Clementeen Graham   Encounter Date: 03/11/2021   PT End of Session - 03/11/21 1623     Visit Number 2    Number of Visits 12    Date for PT Re-Evaluation 04/29/21   extended for humana authorization   Authorization Type HUMANA - asking for 12 visits through 04/29/2021    Authorization - Visit Number 2    Authorization - Number of Visits 12    Progress Note Due on Visit 10    PT Start Time 1558    PT Stop Time 1637    PT Time Calculation (min) 39 min    Activity Tolerance Patient tolerated treatment well    Behavior During Therapy [facility] for tasks assessed/performed             Past Medical History:  Diagnosis Date   Arthritis    "knees" (12/17/2017)   CAD S/P percutaneous coronary angioplasty 09/20/2016   Nstemi 08/2016. Dr. Algie Coffer. DES- brilinta and asa. Requests change to [facility] cardiology; 95% Ramus -> PCI Resolute Onyx DES 2.75 x 18   Central cord syndrome (HCC) 12/17/2017   Chronic combined systolic and diastolic heart failure (HCC) 10/09/2016   EF 45% and grade II diastolic after nstemi   Diet-controlled diabetes mellitus (HCC)    DJD (degenerative joint disease)    Gout    High cholesterol    Hypertension    NSTEMI (non-ST elevated myocardial infarction) (HCC) 09/20/2016   95% Ramus - > PCI    Obesity    Recurrent pulmonary embolism (HCC) 11/'14; 4/'19   a) Bilateral segmental and subsegmental pulmonary emboli with mild RV Strain.; b) after fall with C-spine Fxr --> Acute PE of right main pulmonary artery extending into multiple segments.    Past Surgical History:  Procedure Laterality Date   CARDIAC CATHETERIZATION N/A 09/19/2016   Procedure: Left Heart Cath and Coronary Angiography;  Surgeon: Orpah Cobb, MD;   [facility];  Service: Cardiovascular: 95% proximal Ramus Intermedius --> PCI   CARDIAC CATHETERIZATION N/A 09/19/2016   Procedure: Coronary Stent Intervention;  Surgeon: Yvonne Kendall, MD;  Location: [facility]AS[facility] CV LAB;  Service: Cardiovascular: 95% ramus intermedius;  Resolute Onyx 2.75 x 18 mm drug-eluting stent   CYSTOSCOPY/RETROGRADE/URETEROSCOPY/STONE EXTRACTION WITH BASKET  2035,5974   ureteral stone    MENISCUS REPAIR Left    knee open meniscetomy   TRANSTHORACIC ECHOCARDIOGRAM  2004   no lvh nl ejection fraction   TRANSTHORACIC ECHOCARDIOGRAM  09/19/2016   In setting of NSTEMI:  EF 45-50% with diffuse hypokinesis. GR 2 DD. Mild biatrial enlargement.    There were no vitals filed for this visit.   Subjective Assessment - 03/11/21 1603     Subjective Pt. indicated severity of symptoms seem lower with patches on.  3-4/10 at worst.  Stiffness c sitting prolonged.    Pertinent History CHF, CAD, DJD, DM, HTN, High cholesterol, obesity.  Bilateral knee pain in past    Limitations Walking;House hold activities;Standing;Lifting    Diagnostic tests xray    Patient Stated Goals Reduce pain    Currently in Pain? No/denies    Pain Score 0-No pain   twinges to 3-4/10   Pain Location Hip    Pain Orientation Left    Pain Descriptors /  Indicators Sore;Aching    Pain Type Chronic pain    Pain Onset More than a month ago    Pain Frequency Intermittent    Aggravating Factors  sitting prolonged    Pain Relieving Factors salonpas                [facility] PT Assessment - 03/11/21 0001       Assessment   Medical Diagnosis Lt hip pain    Referring Provider (PT) Dr. Clementeen GrahamEvan Corey    Onset Date/Surgical Date 01/16/21    Hand Dominance Right                           [facility] Adult PT Treatment/Exercise - 03/11/21 0001       Therapeutic Activites    Therapeutic Activities Other Therapeutic Activities    Other Therapeutic Activities Ambulation 94 ft c CGA  x 2 c FWW c platform for Rt arm.  Sit to stand transfer c CGA x 2 c multiple tries from wheelchair to standing.  sit to stand CGA 23.5 inch x 6 no UE help      Neuro Re-ed    Neuro Re-ed Details  standing balance c arm reaches within trunk x 15 bilateral x 2      Knee/Hip Exercises: Seated   Long Arc Quad Left;5 reps    Other Seated Knee/Hip Exercises seated marching 2 x 10 on Lt leg, slr 3 x 5 on Lt leg (foot on stool)    Other Seated Knee/Hip Exercises seated clam shell green band 2 x 20                      PT Short Term Goals - 03/11/21 1633       PT SHORT TERM GOAL #1   Title Patient will demonstrate independent use of home exercise program to maintain progress from in clinic treatments.    Time 3    Period Weeks    Status Revised    Target Date 04/01/21               PT Long Term Goals - 02/18/21 1146       PT LONG TERM GOAL #1   Title Patient will demonstrate/report pain at worst less than or equal to 2/10 to facilitate minimal limitation in daily activity secondary to pain symptoms.    Time 10    Period Weeks    Status New    Target Date 04/29/21      PT LONG TERM GOAL #2   Title v    Time 10    Period Weeks    Status New    Target Date 04/29/21      PT LONG TERM GOAL #3   Title Pt. will demonstrate FOTO outcome > or = 68 to indicated reduced disability due to condtion.    Time 10    Period Weeks    Status New    Target Date 04/29/21      PT LONG TERM GOAL #4   Title Pt. will demonstrate Lt hip flexion MMT 5/5 without pain to facilitate usual daily activity at PLOF.    Time 10    Period Weeks    Status New    Target Date 04/29/21      PT LONG TERM GOAL #5   Title Pt. will demonstrate ability to perform sit to stand to sit from wheelchair, transfers from wheelchair to  bed on first attempt c use of Lt UE to facilitate mobility at PLOF.    Time 10    Period Weeks    Status New    Target Date 04/29/21      Additional Long Term Goals    Additional Long Term Goals Yes      PT LONG TERM GOAL #6   Title Pt. will demonstrate ability to ambulate modified independent with walker > 300 ft to facilitate household and basic community ambulation.    Time 10    Period Weeks    Status New    Target Date 04/29/21                   Plan - 03/11/21 1634     Clinical Impression Statement Assessment of walking and standing tolerance revealed below household ambulator distance but more noted difficulty c transfer movement, influenced by leg weakness and knee mobility.  Standing tolerance poor to fair at this time and improvement in standing tolerance to improve function.    Personal Factors and Comorbidities Comorbidity 3+    Comorbidities CHF, CAD, DJD, DM, HTN, High cholesterol, obesity    Examination-Activity Limitations Locomotion Level;Lift;Transfers;Stand;Carry;Stairs;Squat;Bend;Bed Mobility    Examination-Participation Restrictions Community Activity    Stability/Clinical Decision Making Evolving/Moderate complexity    Rehab Potential Fair    PT Frequency 2x / week    PT Duration Other (comment)   10 weeks (extended for [facility] certification)   PT Treatment/Interventions ADLs/Self Care Home Management;Cryotherapy;Electrical Stimulation;Therapeutic activities;Iontophoresis 4mg /ml Dexamethasone;Moist Heat;Traction;Balance training;Therapeutic exercise;Functional mobility training;Stair training;Gait training;DME Instruction;Ultrasound;Neuromuscular re-education;Patient/family education;Passive range of motion;Spinal Manipulations;Joint Manipulations;Dry needling;Taping;Manual techniques    PT Next Visit Plan Ambulation assessment, hip mobility in supine assessment, begin strengthening, soft tissue release (percussive device, possible DN)    PT Home Exercise Plan seated marching, seated SLR, seated hip abduction    Consulted and Agree with Plan of Care Patient             Patient will benefit from skilled therapeutic  intervention in order to improve the following deficits and impairments:  Abnormal gait, Decreased endurance, Hypomobility, Obesity, Pain, Increased fascial restricitons, Decreased strength, Decreased activity tolerance, Decreased balance, Decreased mobility, Difficulty walking, Impaired perceived functional ability, Improper body mechanics, Impaired flexibility, Postural dysfunction, Decreased coordination, Decreased range of motion  Visit Diagnosis: Pain in left hip  Pain in left leg  Muscle weakness (generalized)  Difficulty in walking, not elsewhere classified     Problem List Patient Active Problem List   Diagnosis Date Noted   Preoperative cardiovascular examination 02/05/2021   Recurrent pulmonary embolism (HCC)    Pain of left heel    C4 spinal cord injury, sequela (HCC) 12/18/2017   Tetraplegia (HCC) 12/18/2017   Aortic aneurysm (HCC) 12/16/2017   Cord compression (HCC) 12/13/2017   Chronic combined systolic and diastolic heart failure (HCC) 10/09/2016   CAD S/P percutaneous coronary angioplasty 09/20/2016   Primary osteoarthritis of knees, bilateral 01/11/2016   Erectile dysfunction 07/30/2015   Morbid obesity (HCC) 07/18/2013   Angioedema of lips 07/13/2013   Multinodular goiter 07/13/2013   Hyperlipidemia associated with type 2 diabetes mellitus (HCC) 10/02/2011   SOB (shortness of breath) 10/02/2011   BPH with urinary obstruction 07/17/2010   Diabetes mellitus type 2, controlled (HCC) 07/17/2010   Bilateral lower extremity edema 12/08/2008   NEUROPATHY, IDIOPATHIC PERIPHERAL NEC 05/31/2007   Osteoarthritis 03/22/2007   Gout 03/15/2007   Hypertension associated with diabetes (HCC) 03/15/2007    03/17/2007, PT, DPT, OCS,  ATC 03/11/21  4:43 PM    [facility] Physical Therapy [address] [place], [place], [address] Phone: [phone]   Fax:  [phone]  Name: Franklin Hicks MRN: [id number] Date of Birth: 1951-07-11

## 2021-03-12 ENCOUNTER — Encounter: Payer: Self-pay | Admitting: Family Medicine

## 2021-03-12 ENCOUNTER — Ambulatory Visit: Payer: Medicare PPO | Admitting: Family Medicine

## 2021-03-12 VITALS — BP 130/60 | HR 85 | Ht 72.0 in

## 2021-03-12 DIAGNOSIS — M25552 Pain in left hip: Secondary | ICD-10-CM
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
## 2021-03-12 NOTE — Patient Instructions (Signed)
Thank you for coming in today.   Continue PT.   Recheck with me in 6 weeks.   Let me know if this is not working.   I could do an injection but I would very much like to avoid it if possible.

## 2021-03-13 ENCOUNTER — Ambulatory Visit: Payer: Medicare PPO | Admitting: Plastic Surgery

## 2021-03-13 ENCOUNTER — Ambulatory Visit: Payer: Medicare PPO | Admitting: Rehabilitative and Restorative Service Providers"

## 2021-03-13 ENCOUNTER — Other Ambulatory Visit: Payer: Self-pay

## 2021-03-13 ENCOUNTER — Encounter: Payer: Self-pay | Admitting: Rehabilitative and Restorative Service Providers"

## 2021-03-13 DIAGNOSIS — M6281 Muscle weakness (generalized): Secondary | ICD-10-CM

## 2021-03-13 DIAGNOSIS — R262 Difficulty in walking, not elsewhere classified: Secondary | ICD-10-CM | POA: Diagnosis not present

## 2021-03-13 DIAGNOSIS — M79605 Pain in left leg: Secondary | ICD-10-CM

## 2021-03-13 DIAGNOSIS — M25552 Pain in left hip: Secondary | ICD-10-CM | POA: Diagnosis not present

## 2021-03-13 DIAGNOSIS — S52501A Unspecified fracture of the lower end of right radius, initial encounter for closed fracture: Secondary | ICD-10-CM | POA: Diagnosis not present

## 2021-03-13 NOTE — Therapy (Signed)
[facility] [address] [place], [place], [address] Phone: [phone]   Fax:  [phone]  Physical Therapy Treatment  Patient Details  Name: Franklin Hicks MRN: [id number] Date of Birth: 1950-10-12 Referring Provider (PT): Dr. Clementeen Graham   Encounter Date: 03/13/2021   PT End of Session - 03/13/21 1601     Visit Number 3    Number of Visits 12    Date for PT Re-Evaluation 04/29/21   extended for [employer] authorization   Authorization Type HUMANA - asking for 12 visits through 04/29/2021    Authorization - Visit Number 3    Authorization - Number of Visits 12    Progress Note Due on Visit 10    PT Start Time 1557    PT Stop Time 1635    PT Time Calculation (min) 38 min    Activity Tolerance Patient tolerated treatment well    Behavior During Therapy [facility] for tasks assessed/performed             Past Medical History:  Diagnosis Date   Arthritis    "knees" (12/17/2017)   CAD S/P percutaneous coronary angioplasty 09/20/2016   Nstemi 08/2016. Dr. Algie Coffer. DES- brilinta and asa. Requests change to [facility] cardiology; 95% Ramus -> PCI Resolute Onyx DES 2.75 x 18   Central cord syndrome (HCC) 12/17/2017   Chronic combined systolic and diastolic heart failure (HCC) 10/09/2016   EF 45% and grade II diastolic after nstemi   Diet-controlled diabetes mellitus (HCC)    DJD (degenerative joint disease)    Gout    High cholesterol    Hypertension    NSTEMI (non-ST elevated myocardial infarction) (HCC) 09/20/2016   95% Ramus - > PCI    Obesity    Recurrent pulmonary embolism (HCC) 11/'14; 4/'19   a) Bilateral segmental and subsegmental pulmonary emboli with mild RV Strain.; b) after fall with C-spine Fxr --> Acute PE of right main pulmonary artery extending into multiple segments.    Past Surgical History:  Procedure Laterality Date   CARDIAC CATHETERIZATION N/A 09/19/2016   Procedure: Left Heart Cath and Coronary Angiography;  Surgeon: Orpah Cobb, MD;   Location: [facility] INVASIVE CV LAB;  Service: Cardiovascular: 95% proximal Ramus Intermedius --> PCI   CARDIAC CATHETERIZATION N/A 09/19/2016   Procedure: Coronary Stent Intervention;  Surgeon: Yvonne Kendall, MD;  Location: [facility] INVASIVE CV LAB;  Service: Cardiovascular: 95% ramus intermedius;  Resolute Onyx 2.75 x 18 mm drug-eluting stent   CYSTOSCOPY/RETROGRADE/URETEROSCOPY/STONE EXTRACTION WITH BASKET  9528,4132   ureteral stone    MENISCUS REPAIR Left    knee open meniscetomy   TRANSTHORACIC ECHOCARDIOGRAM  2004   no lvh nl ejection fraction   TRANSTHORACIC ECHOCARDIOGRAM  09/19/2016   In setting of NSTEMI:  EF 45-50% with diffuse hypokinesis. GR 2 DD. Mild biatrial enlargement.    There were no vitals filed for this visit.   Subjective Assessment - 03/13/21 1600     Subjective Pt. reported pain in lateral hip sore and about a 4/10.  Continued knee tightness/stiffness part of condition at this time.  Pt. indicated doing HEP.    Pertinent History CHF, CAD, DJD, DM, HTN, High cholesterol, obesity.  Bilateral knee pain in past    Limitations Walking;House hold activities;Standing;Lifting    Diagnostic tests xray    Patient Stated Goals Reduce pain    Pain Onset More than a month ago  [facility] Adult PT Treatment/Exercise - 03/13/21 0001       Therapeutic Activites    Other Therapeutic Activities chair to chair pivot transfers (to left) x 2 (to right) x 1 c SBA.  24.5 inch table sit to stand x 5 c SBA x 2.  ambulation in parallel bars fwd c Lt arm support 10 ft x 4 CGA      Knee/Hip Exercises: Aerobic   Nustep Lvl 5 10 mins UE/LE      Knee/Hip Exercises: Seated   Other Seated Knee/Hip Exercises seated slr 2 x 10 bilateral, seated marching 2 x 10      Manual Therapy   Manual therapy comments STM to Lt lateral hip musculature c percussive device                      PT Short Term Goals - 03/11/21 1633       PT SHORT TERM  GOAL #1   Title Patient will demonstrate independent use of home exercise program to maintain progress from in clinic treatments.    Time 3    Period Weeks    Status Revised    Target Date 04/01/21               PT Long Term Goals - 02/18/21 1146       PT LONG TERM GOAL #1   Title Patient will demonstrate/report pain at worst less than or equal to 2/10 to facilitate minimal limitation in daily activity secondary to pain symptoms.    Time 10    Period Weeks    Status New    Target Date 04/29/21      PT LONG TERM GOAL #2   Title v    Time 10    Period Weeks    Status New    Target Date 04/29/21      PT LONG TERM GOAL #3   Title Pt. will demonstrate FOTO outcome > or = 68 to indicated reduced disability due to condtion.    Time 10    Period Weeks    Status New    Target Date 04/29/21      PT LONG TERM GOAL #4   Title Pt. will demonstrate Lt hip flexion MMT 5/5 without pain to facilitate usual daily activity at PLOF.    Time 10    Period Weeks    Status New    Target Date 04/29/21      PT LONG TERM GOAL #5   Title Pt. will demonstrate ability to perform sit to stand to sit from wheelchair, transfers from wheelchair to bed on first attempt c use of Lt UE to facilitate mobility at PLOF.    Time 10    Period Weeks    Status New    Target Date 04/29/21      Additional Long Term Goals   Additional Long Term Goals Yes      PT LONG TERM GOAL #6   Title Pt. will demonstrate ability to ambulate modified independent with walker > 300 ft to facilitate household and basic community ambulation.    Time 10    Period Weeks    Status New    Target Date 04/29/21                   Plan - 03/13/21 1621     Clinical Impression Statement Pt. demonstrated improved quality in transfers and ther ex movement as noted today compared to  last visit.  Impairments from bilateral knee mobility, strength present and impacting functional mobility.  Able to ambulate c Lt arm on  parallel bars today with continued noted trunk flexion, mild improvement in cues.    Personal Factors and Comorbidities Comorbidity 3+    Comorbidities CHF, CAD, DJD, DM, HTN, High cholesterol, obesity    Examination-Activity Limitations Locomotion Level;Lift;Transfers;Stand;Carry;Stairs;Squat;Bend;Bed Mobility    Examination-Participation Restrictions Community Activity    Stability/Clinical Decision Making Evolving/Moderate complexity    Rehab Potential Fair    PT Frequency 2x / week    PT Duration Other (comment)   10 weeks (extended for [facility] certification)   PT Treatment/Interventions ADLs/Self Care Home Management;Cryotherapy;Electrical Stimulation;Therapeutic activities;Iontophoresis 4mg /ml Dexamethasone;Moist Heat;Traction;Balance training;Therapeutic exercise;Functional mobility training;Stair training;Gait training;DME Instruction;Ultrasound;Neuromuscular re-education;Patient/family education;Passive range of motion;Spinal Manipulations;Joint Manipulations;Dry needling;Taping;Manual techniques    PT Next Visit Plan Ambulation tolerance improvements, lateral hip treatment prn.  Continue to build strength and endurance    PT Home Exercise Plan seated marching, seated SLR, seated hip abduction    Consulted and Agree with Plan of Care Patient             Patient will benefit from skilled therapeutic intervention in order to improve the following deficits and impairments:  Abnormal gait, Decreased endurance, Hypomobility, Obesity, Pain, Increased fascial restricitons, Decreased strength, Decreased activity tolerance, Decreased balance, Decreased mobility, Difficulty walking, Impaired perceived functional ability, Improper body mechanics, Impaired flexibility, Postural dysfunction, Decreased coordination, Decreased range of motion  Visit Diagnosis: Pain in left hip  Pain in left leg  Muscle weakness (generalized)  Difficulty in walking, not elsewhere classified     Problem  List Patient Active Problem List   Diagnosis Date Noted   Preoperative cardiovascular examination 02/05/2021   Recurrent pulmonary embolism (HCC)    Pain of left heel    C4 spinal cord injury, sequela (HCC) 12/18/2017   Tetraplegia (HCC) 12/18/2017   Aortic aneurysm (HCC) 12/16/2017   Cord compression (HCC) 12/13/2017   Chronic combined systolic and diastolic heart failure (HCC) 10/09/2016   CAD S/P percutaneous coronary angioplasty 09/20/2016   Primary osteoarthritis of knees, bilateral 01/11/2016   Erectile dysfunction 07/30/2015   Morbid obesity (HCC) 07/18/2013   Angioedema of lips 07/13/2013   Multinodular goiter 07/13/2013   Hyperlipidemia associated with type 2 diabetes mellitus (HCC) 10/02/2011   SOB (shortness of breath) 10/02/2011   BPH with urinary obstruction 07/17/2010   Diabetes mellitus type 2, controlled (HCC) 07/17/2010   Bilateral lower extremity edema 12/08/2008   NEUROPATHY, IDIOPATHIC PERIPHERAL NEC 05/31/2007   Osteoarthritis 03/22/2007   Gout 03/15/2007   Hypertension associated with diabetes (HCC) 03/15/2007   03/17/2007, PT, DPT, OCS, ATC 03/13/21  4:35 PM    [facility] [address] [place], [place], [place] Phone: [phone]   Fax:  [phone]  Name: Franklin Hicks MRN: Berna Spare Date of Birth: 10/17/1950

## 2021-03-13 NOTE — Progress Notes (Signed)
   Referring Provider Rodolph Bong, MD [address] [place],  [place] [address]   CC:  Chief Complaint  Patient presents with   Follow-up      Franklin Hicks is an 69 y.o. male.  HPI: Patient presents about 2 months out from a right distal radius fracture.  We ended up treating this nonoperative largely due to his comorbidities.  He feels like things are going well.  He has been in his splint the whole time.  He reports good sensation in his fingers and he has been able to move his wrist a little bit.  He has not been able to get another x-ray.  Review of Systems General: Denies fevers and chills  Physical Exam Vitals with BMI 03/12/2021 02/12/2021 02/04/2021  Height 6\' 0"  6\' 0"  6\' 0"   Weight - - 390 lbs  BMI - - 52.88  Systolic 130 158  Diastolic 60 90 75  Pulse 85 71 72    General:  No acute distress,  Alert and oriented, Non-Toxic, Normal speech and affect Right hand: Fingers well-perfused normal cap refill to palp radial pulse.  Sensation is intact throughout.  He does have reasonably good finger flexion and also has full extension.  He has reasonably good thumb opposition and can almost touch his pinky fingertip.  In terms of wrist motion he has 10 to 15 degrees of flexion and extension and radial and ulnar deviation.  Not much in the way of external deformity.  Assessment/Plan Patient presents with good progress with nonoperative treatment of his right distal radius fracture.  He feels like he is going to be reasonably functional.  I did reinforce there may be some benefit to getting an x-ray to check the progress of the bony union but he is going to think about going to get that.  Otherwise I think he can come out of his splint but will recommend avoiding very strenuous strengthening activities for another few weeks.  He is fully understanding of that and I will see him in 6 weeks time.  03/13/2021, 2:25 PM

## 2021-03-18 ENCOUNTER — Other Ambulatory Visit: Payer: Self-pay

## 2021-03-18 ENCOUNTER — Ambulatory Visit: Payer: Medicare PPO | Admitting: Rehabilitative and Restorative Service Providers"

## 2021-03-18 ENCOUNTER — Encounter: Payer: Self-pay | Admitting: Rehabilitative and Restorative Service Providers"

## 2021-03-18 DIAGNOSIS — M79605 Pain in left leg: Secondary | ICD-10-CM

## 2021-03-18 DIAGNOSIS — M6281 Muscle weakness (generalized): Secondary | ICD-10-CM | POA: Diagnosis not present

## 2021-03-18 DIAGNOSIS — R262 Difficulty in walking, not elsewhere classified: Secondary | ICD-10-CM | POA: Diagnosis not present

## 2021-03-18 DIAGNOSIS — M25552 Pain in left hip: Secondary | ICD-10-CM

## 2021-03-18 NOTE — Therapy (Signed)
[facility] Therapy [address] [place], [place], [address] Phone: [phone]   Fax:  [phone]  Physical Therapy Treatment  Patient Details  Name: Franklin Hicks MRN: [id number] Date of Birth: 28-May-1951 Referring Provider (PT): Dr. Clementeen Graham   Encounter Date: 03/18/2021   PT End of Session - 03/18/21 1639     Visit Number 4    Number of Visits 12    Date for PT Re-Evaluation 04/29/21   extended for [employer] authorization   Authorization Type HUMANA - asking for 12 visits through 04/29/2021    Authorization - Visit Number 4    Authorization - Number of Visits 12    Progress Note Due on Visit 10    PT Start Time 1555    PT Stop Time 1639    PT Time Calculation (min) 44 min    Activity Tolerance Patient tolerated treatment well    Behavior During Therapy [facility] for tasks assessed/performed             Past Medical History:  Diagnosis Date   Arthritis    "knees" (12/17/2017)   CAD S/P percutaneous coronary angioplasty 09/20/2016   Nstemi 08/2016. Dr. Algie Coffer. DES- brilinta and asa. Requests change to [facility] cardiology; 95% Ramus -> PCI Resolute Onyx DES 2.75 x 18   Central cord syndrome (HCC) 12/17/2017   Chronic combined systolic and diastolic heart failure (HCC) 10/09/2016   EF 45% and grade II diastolic after nstemi   Diet-controlled diabetes mellitus (HCC)    DJD (degenerative joint disease)    Gout    High cholesterol    Hypertension    NSTEMI (non-ST elevated myocardial infarction) (HCC) 09/20/2016   95% Ramus - > PCI    Obesity    Recurrent pulmonary embolism (HCC) 11/'14; 4/'19   a) Bilateral segmental and subsegmental pulmonary emboli with mild RV Strain.; b) after fall with C-spine Fxr --> Acute PE of right main pulmonary artery extending into multiple segments.    Past Surgical History:  Procedure Laterality Date   CARDIAC CATHETERIZATION N/A 09/19/2016   Procedure: Left Heart Cath and Coronary Angiography;  Surgeon: Orpah Cobb, MD;   Location: [facility] INVASIVE CV LAB;  Service: Cardiovascular: 95% proximal Ramus Intermedius --> PCI   CARDIAC CATHETERIZATION N/A 09/19/2016   Procedure: Coronary Stent Intervention;  Surgeon: Yvonne Kendall, MD;  Location: [facility] INVASIVE CV LAB;  Service: Cardiovascular: 95% ramus intermedius;  Resolute Onyx 2.75 x 18 mm drug-eluting stent   CYSTOSCOPY/RETROGRADE/URETEROSCOPY/STONE EXTRACTION WITH BASKET  6629,4765   ureteral stone    MENISCUS REPAIR Left    knee open meniscetomy   TRANSTHORACIC ECHOCARDIOGRAM  2004   no lvh nl ejection fraction   TRANSTHORACIC ECHOCARDIOGRAM  09/19/2016   In setting of NSTEMI:  EF 45-50% with diffuse hypokinesis. GR 2 DD. Mild biatrial enlargement.    There were no vitals filed for this visit.   Subjective Assessment - 03/18/21 1605     Subjective Pt. indicated Lt hip was painful today and tight, reported at 6-7/10.  Pt. indicated he isn't sure why it gets tighter at times.    Pertinent History CHF, CAD, DJD, DM, HTN, High cholesterol, obesity.  Bilateral knee pain in past    Limitations Walking;House hold activities;Standing;Lifting    Diagnostic tests xray    Patient Stated Goals Reduce pain    Currently in Pain? Yes    Pain Location Hip    Pain Orientation Left;Lateral;Anterior    Pain Descriptors / Indicators Tightness;Sore;Aching  Pain Type Chronic pain    Pain Onset More than a month ago    Pain Frequency Intermittent    Aggravating Factors  lying on side, insidious worsening, pressure to touch    Pain Relieving Factors percussive device helped.                               [facility] Adult PT Treatment/Exercise - 03/18/21 0001       Therapeutic Activites    Other Therapeutic Activities sit to stand transfers c cues for trunk movement into extension upon standing from 21 inch chair no UE assistance x 10, ambulation c CGA c SBA extra with forearm supported FWW to fatigue - 140 ft      Knee/Hip Exercises: Stretches   Other  Knee/Hip Stretches lumbar /hip extension in standing x 10      Knee/Hip Exercises: Aerobic   Nustep Lvl 5 10 mins UE/LE      Knee/Hip Exercises: Seated   Other Seated Knee/Hip Exercises SLR x 5      Manual Therapy   Manual therapy comments STM to Lt lateral hip musculature c percussive device                    PT Education - 03/18/21 1637     Education Details Inclusion of lumbar extension stretching at home.    Methods Explanation;Verbal cues;Demonstration    Comprehension Verbalized understanding;Returned demonstration              PT Short Term Goals - 03/18/21 1638       PT SHORT TERM GOAL #1   Title Patient will demonstrate independent use of home exercise program to maintain progress from in clinic treatments.    Time 3    Period Weeks    Status On-going    Target Date 04/01/21               PT Long Term Goals - 02/18/21 1146       PT LONG TERM GOAL #1   Title Patient will demonstrate/report pain at worst less than or equal to 2/10 to facilitate minimal limitation in daily activity secondary to pain symptoms.    Time 10    Period Weeks    Status New    Target Date 04/29/21      PT LONG TERM GOAL #2   Title v    Time 10    Period Weeks    Status New    Target Date 04/29/21      PT LONG TERM GOAL #3   Title Pt. will demonstrate FOTO outcome > or = 68 to indicated reduced disability due to condtion.    Time 10    Period Weeks    Status New    Target Date 04/29/21      PT LONG TERM GOAL #4   Title Pt. will demonstrate Lt hip flexion MMT 5/5 without pain to facilitate usual daily activity at PLOF.    Time 10    Period Weeks    Status New    Target Date 04/29/21      PT LONG TERM GOAL #5   Title Pt. will demonstrate ability to perform sit to stand to sit from wheelchair, transfers from wheelchair to bed on first attempt c use of Lt UE to facilitate mobility at PLOF.    Time 10    Period Weeks    Status  New    Target Date  04/29/21      Additional Long Term Goals   Additional Long Term Goals Yes      PT LONG TERM GOAL #6   Title Pt. will demonstrate ability to ambulate modified independent with walker > 300 ft to facilitate household and basic community ambulation.    Time 10    Period Weeks    Status New    Target Date 04/29/21                   Plan - 03/18/21 1638     Clinical Impression Statement Improved distance in ambulation noted today compared to previous.  Pt. did demonstrate increased difficulty in Lt hip flexion movement today compared to previous visits.  Cues given throughout progressive mobility intervention for positioning and incorporation of improved lumbar extension for upright posture.  New HEP given to improve lumbar and hip extension mobility.    Personal Factors and Comorbidities Comorbidity 3+    Comorbidities CHF, CAD, DJD, DM, HTN, High cholesterol, obesity    Examination-Activity Limitations Locomotion Level;Lift;Transfers;Stand;Carry;Stairs;Squat;Bend;Bed Mobility    Examination-Participation Restrictions Community Activity    Stability/Clinical Decision Making Evolving/Moderate complexity    Rehab Potential Fair    PT Frequency 2x / week    PT Duration Other (comment)   10 weeks (extended for [facility] certification)   PT Treatment/Interventions ADLs/Self Care Home Management;Cryotherapy;Electrical Stimulation;Therapeutic activities;Iontophoresis 4mg /ml Dexamethasone;Moist Heat;Traction;Balance training;Therapeutic exercise;Functional mobility training;Stair training;Gait training;DME Instruction;Ultrasound;Neuromuscular re-education;Patient/family education;Passive range of motion;Spinal Manipulations;Joint Manipulations;Dry needling;Taping;Manual techniques    PT Next Visit Plan Ambulation tolerance improvements, lateral hip treatment prn.  Continue to build strength and endurance    PT Home Exercise Plan seated marching, seated SLR, seated hip abduction    Consulted and  Agree with Plan of Care Patient             Patient will benefit from skilled therapeutic intervention in order to improve the following deficits and impairments:  Abnormal gait, Decreased endurance, Hypomobility, Obesity, Pain, Increased fascial restricitons, Decreased strength, Decreased activity tolerance, Decreased balance, Decreased mobility, Difficulty walking, Impaired perceived functional ability, Improper body mechanics, Impaired flexibility, Postural dysfunction, Decreased coordination, Decreased range of motion  Visit Diagnosis: Pain in left hip  Pain in left leg  Muscle weakness (generalized)  Difficulty in walking, not elsewhere classified     Problem List Patient Active Problem List   Diagnosis Date Noted   Preoperative cardiovascular examination 02/05/2021   Recurrent pulmonary embolism (HCC)    Pain of left heel    C4 spinal cord injury, sequela (HCC) 12/18/2017   Tetraplegia (HCC) 12/18/2017   Aortic aneurysm (HCC) 12/16/2017   Cord compression (HCC) 12/13/2017   Chronic combined systolic and diastolic heart failure (HCC) 10/09/2016   CAD S/P percutaneous coronary angioplasty 09/20/2016   Primary osteoarthritis of knees, bilateral 01/11/2016   Erectile dysfunction 07/30/2015   Morbid obesity (HCC) 07/18/2013   Angioedema of lips 07/13/2013   Multinodular goiter 07/13/2013   Hyperlipidemia associated with type 2 diabetes mellitus (HCC) 10/02/2011   SOB (shortness of breath) 10/02/2011   BPH with urinary obstruction 07/17/2010   Diabetes mellitus type 2, controlled (HCC) 07/17/2010   Bilateral lower extremity edema 12/08/2008   NEUROPATHY, IDIOPATHIC PERIPHERAL NEC 05/31/2007   Osteoarthritis 03/22/2007   Gout 03/15/2007   Hypertension associated with diabetes (HCC) 03/15/2007   03/17/2007, PT, DPT, OCS, ATC 03/18/21  4:40 PM    [facility] Physical Therapy [address] [place], [place], [place]  Phone: [phone]   Fax:   [phone]  Name: Franklin Hicks MRN: [id number] Date of Birth: 11/19/1950

## 2021-03-19 ENCOUNTER — Ambulatory Visit: Payer: Medicare PPO | Attending: Plastic Surgery | Admitting: Occupational Therapy

## 2021-03-19 DIAGNOSIS — M25531 Pain in right wrist: Secondary | ICD-10-CM

## 2021-03-19 DIAGNOSIS — M6281 Muscle weakness (generalized): Secondary | ICD-10-CM

## 2021-03-19 DIAGNOSIS — M25631 Stiffness of right wrist, not elsewhere classified: Secondary | ICD-10-CM | POA: Diagnosis not present

## 2021-03-19 DIAGNOSIS — R278 Other lack of coordination: Secondary | ICD-10-CM | POA: Diagnosis not present

## 2021-03-19 NOTE — Patient Instructions (Signed)
AROM: Wrist Extension   .  With ____ palm down, bend wrist up. Repeat __15__ times per set.  Do __4-6__ sessions per day.    AROM: Wrist Flexion   With_____ palm up, bend wrist up. Repeat __15__ times per set.  Do _4-6___ sessions per day.   AROM: Forearm Pronation / Supination   With ____ arm in handshake position, slowly rotate palm down until stretch is felt. Relax. Then rotate palm up until stretch is felt. Repeat _15___ times per set. Do _4-6___ sessions per day.  Flexor Tendon Gliding (Active Hook Fist)   With fingers and knuckles straight, bend middle and tip joints. Do not bend large knuckles. Repeat _10-15___ times. Do _4-6___ sessions per day.  MP Flexion (Active)   With back of hand on table, bend large knuckles as far as they will go, keeping small joints straight. Repeat _10-15___ times. Do __4-6__ sessions per day. Activity: Reach into a narrow container.*      Finger Flexion / Extension   With palm up, bend fingers of left hand toward palm, making a  fist. Straighten fingers, opening fist. Repeat sequence _10-15___ times per session. Do _4-6__ sessions per day. Hand Variation: Palm down   Copyright  VHI. All rights reserved.  Flexor Tendon Gliding (Active Hook Fist)   With fingers and knuckles straight, bend middle and tip joints. Do not bend large knuckles. Repeat _10-15___ times. Do _4-6___ sessions per day.  MP Flexion (Active)   With back of hand on table, bend large knuckles as far as they will go, keeping small joints straight. Repeat _10-15___ times. Do __4-6__ sessions per day. Activity: Reach into a narrow container.*      Finger Flexion / Extension   With palm up, bend fingers of left hand toward palm, making a  fist. Straighten fingers, opening fist. Repeat sequence _10-15___ times per session. Do _4-6__ sessions per day. Hand Variation: Palm down   Copyright  VHI. All rights reserved.    Copyright  VHI. All rights  reserved.

## 2021-03-20 ENCOUNTER — Other Ambulatory Visit: Payer: Self-pay

## 2021-03-20 ENCOUNTER — Ambulatory Visit: Payer: Medicare PPO | Admitting: Rehabilitative and Restorative Service Providers"

## 2021-03-20 ENCOUNTER — Encounter: Payer: Self-pay | Admitting: Rehabilitative and Restorative Service Providers"

## 2021-03-20 DIAGNOSIS — R600 Localized edema: Secondary | ICD-10-CM | POA: Diagnosis not present

## 2021-03-20 DIAGNOSIS — M25552 Pain in left hip: Secondary | ICD-10-CM | POA: Diagnosis not present

## 2021-03-20 DIAGNOSIS — M79605 Pain in left leg: Secondary | ICD-10-CM | POA: Diagnosis not present

## 2021-03-20 DIAGNOSIS — R262 Difficulty in walking, not elsewhere classified: Secondary | ICD-10-CM
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
## 2021-03-20 NOTE — Therapy (Signed)
[facility] [address] [place], [place], [address] Phone: [phone]   Fax:  [phone]  Physical Therapy Treatment  Patient Details  Name: Franklin Hicks MRN: [id number] Date of Birth: 03/07/1951 Referring Provider (PT): Dr. Clementeen GrahamEvan Corey   Encounter Date: 03/20/2021   PT End of Session - 03/20/21 1607     Visit Number 5    Number of Visits 12    Date for PT Re-Evaluation 04/29/21   extended for [employer] authorization   Authorization Type [employer] - asking for 12 visits through 04/29/2021    Authorization - Visit Number 5    Authorization - Number of Visits 12    Progress Note Due on Visit 10    PT Start Time 1600    PT Stop Time 1639    PT Time Calculation (min) 39 min    Activity Tolerance Patient tolerated treatment well    [facility] tasks assessed/performed             Past Medical History:  Diagnosis Date   Arthritis    "knees" (12/17/2017)   CAD S/P percutaneous coronary angioplasty 09/20/2016   Nstemi 08/2016. Dr. Algie CofferKadakia. DES- brilinta and asa. [facility]; 95% Ramus -> PCI Resolute Onyx DES 2.75 x 18   Central cord syndrome (HCC) 12/17/2017   Chronic combined systolic and diastolic heart failure (HCC) 10/09/2016   EF 45% and grade II diastolic after nstemi   Diet-controlled diabetes mellitus (HCC)    DJD (degenerative joint disease)    Gout    High cholesterol    Hypertension    NSTEMI (non-ST elevated myocardial infarction) (HCC) 09/20/2016   95% Ramus - > PCI    Obesity    Recurrent pulmonary embolism (HCC) 11/'14; 4/'19   a) Bilateral segmental and subsegmental pulmonary emboli with mild RV Strain.; b) after fall with C-spine Fxr --> Acute PE of right main pulmonary artery extending into multiple segments.    Past Surgical History:  Procedure Laterality Date   CARDIAC CATHETERIZATION N/A 09/19/2016   Procedure: Left Heart Cath and Coronary Angiography;  Surgeon: Orpah CobbAjay Kadakia, MD;   [facility] IN[facility];  Service: Cardiovascular: 95% proximal Ramus Intermedius --> PCI   CARDIAC CATHETERIZATION N/A 09/19/2016   Procedure: Coronary Stent Intervention;  Surgeon: Yvonne Kendallhristopher End, MD;  Location: [facility] INVASIVE CV LAB;  Service: Cardiovascular: 95% ramus intermedius;  Resolute Onyx 2.75 x 18 mm drug-eluting stent   CYSTOSCOPY/RETROGRADE/URETEROSCOPY/STONE EXTRACTION WITH BASKET  [id number]   ureteral stone    MENISCUS REPAIR Left    knee open meniscetomy   TRANSTHORACIC ECHOCARDIOGRAM  2004   no lvh nl ejection fraction   TRANSTHORACIC ECHOCARDIOGRAM  09/19/2016   In setting of NSTEMI:  EF 45-50% with diffuse hypokinesis. GR 2 DD. Mild biatrial enlargement.    There were no vitals filed for this visit.   Subjective Assessment - 03/20/21 1608     Subjective Pt. indicated pain today in 2-3/10 for Lt hip.  Pt. indicated he was tired everywhere after last visit but felt like his pain was better.    Pertinent History CHF, CAD, DJD, DM, HTN, High cholesterol, obesity.  Bilateral knee pain in past    Limitations Walking;House hold activities;Standing;Lifting    Diagnostic tests xray    Patient Stated Goals Reduce pain    Currently in Pain? Yes    Pain Score 2     Pain Location Hip    Pain Orientation Left;Lateral;Posterior  Pain Descriptors / Indicators Tightness;Sore    Pain Type Chronic pain    Pain Onset More than a month ago    Pain Frequency Intermittent    Aggravating Factors  pressure, fatigue caused by exercise.    Pain Relieving Factors treatment helped last visit                [facility] PT Assessment - 03/20/21 0001       Assessment   Medical Diagnosis Lt hip pain    Referring Provider (PT) Dr. Clementeen Graham    Onset Date/Surgical Date 01/16/21    Hand Dominance Right      Transfers   Transfers Stand Pivot Transfers    Sit to Stand 6: Modified independent (Device/Increase time)   increased time   Stand Pivot Transfers 6: Modified independent  (Device/Increase time)   increased time                          [facility] Adult PT Treatment/Exercise - 03/20/21 0001       Therapeutic Activites    Other Therapeutic Activities ambulation c FWW 25 ft x 2 CGA, supervision on stand pivot transfer x 4      Knee/Hip Exercises: Aerobic   Nustep Lvl 5 12 mins UE/LE      Knee/Hip Exercises: Machines for Strengthening   Cybex Leg Press DL 50 lbs 2 x 10, SL 25 lbs 2 x 10 bilateral      Manual Therapy   Manual therapy comments STM to Lt lateral hip musculature c percussive device                      PT Short Term Goals - 03/18/21 1638       PT SHORT TERM GOAL #1   Title Patient will demonstrate independent use of home exercise program to maintain progress from in clinic treatments.    Time 3    Period Weeks    Status On-going    Target Date 04/01/21               PT Long Term Goals - 02/18/21 1146       PT LONG TERM GOAL #1   Title Patient will demonstrate/report pain at worst less than or equal to 2/10 to facilitate minimal limitation in daily activity secondary to pain symptoms.    Time 10    Period Weeks    Status New    Target Date 04/29/21      PT LONG TERM GOAL #2   Title v    Time 10    Period Weeks    Status New    Target Date 04/29/21      PT LONG TERM GOAL #3   Title Pt. will demonstrate FOTO outcome > or = 68 to indicated reduced disability due to condtion.    Time 10    Period Weeks    Status New    Target Date 04/29/21      PT LONG TERM GOAL #4   Title Pt. will demonstrate Lt hip flexion MMT 5/5 without pain to facilitate usual daily activity at PLOF.    Time 10    Period Weeks    Status New    Target Date 04/29/21      PT LONG TERM GOAL #5   Title Pt. will demonstrate ability to perform sit to stand to sit from wheelchair, transfers from wheelchair to bed on  first attempt c use of Lt UE to facilitate mobility at [facility].    Time 10    Period Weeks    Status New     Target Date 04/29/21      Additional Long Term Goals   Additional Long Term Goals Yes      PT LONG TERM GOAL #6   Title Pt. will demonstrate ability to ambulate modified independent with walker > 300 ft to facilitate household and basic community ambulation.    Time 10    Period Weeks    Status New    Target Date 04/29/21                   Plan - 03/20/21 1634     Clinical Impression Statement Continued to promote improved endurance and strength through therapeutic activity and ther ex.  Overall progression greatly impacted by knee complaints and overall limitations from restricted mobility/strength bilaterally, Lt > Rt.  Continued skilled PT services for symptom relief and improved mobility/strength indicated at this time.    Personal Factors and Comorbidities Comorbidity 3+    Comorbidities CHF, CAD, DJD, DM, HTN, High cholesterol, obesity    Examination-Activity Limitations Locomotion Level;Lift;Transfers;Stand;Carry;Stairs;Squat;Bend;Bed Mobility    Examination-Participation Restrictions Community Activity    Stability/Clinical Decision Making Evolving/Moderate complexity    Rehab Potential Fair    PT Frequency 2x / week    PT Duration Other (comment)   10 weeks (extended for [facility] certification)   PT Treatment/Interventions ADLs/Self Care Home Management;Cryotherapy;Electrical Stimulation;Therapeutic activities;Iontophoresis 4mg /ml Dexamethasone;Moist Heat;Traction;Balance training;Therapeutic exercise;Functional mobility training;Stair training;Gait training;DME Instruction;Ultrasound;Neuromuscular re-education;Patient/family education;Passive range of motion;Spinal Manipulations;Joint Manipulations;Dry needling;Taping;Manual techniques    PT Next Visit Plan Leg press if it was ok, continued endurance gains, percussive device prn.    PT Home Exercise Plan seated marching, seated SLR, seated hip abduction    Consulted and Agree with Plan of Care Patient              Patient will benefit from skilled therapeutic intervention in order to improve the following deficits and impairments:  Abnormal gait, Decreased endurance, Hypomobility, Obesity, Pain, Increased fascial restricitons, Decreased strength, Decreased activity tolerance, Decreased balance, Decreased mobility, Difficulty walking, Impaired perceived functional ability, Improper body mechanics, Impaired flexibility, Postural dysfunction, Decreased coordination, Decreased range of motion  Visit Diagnosis: Pain in left hip  Pain in left leg  Difficulty in walking, not elsewhere classified  Localized edema     Problem List Patient Active Problem List   Diagnosis Date Noted   Preoperative cardiovascular examination 02/05/2021   Recurrent pulmonary embolism (HCC)    Pain of left heel    C4 spinal cord injury, sequela (HCC) 12/18/2017   Tetraplegia (HCC) 12/18/2017   Aortic aneurysm (HCC) 12/16/2017   Cord compression (HCC) 12/13/2017   Chronic combined systolic and diastolic heart failure (HCC) 10/09/2016   CAD S/P percutaneous coronary angioplasty 09/20/2016   Primary osteoarthritis of knees, bilateral 01/11/2016   Erectile dysfunction 07/30/2015   Morbid obesity (HCC) 07/18/2013   Angioedema of lips 07/13/2013   Multinodular goiter 07/13/2013   Hyperlipidemia associated with type 2 diabetes mellitus (HCC) 10/02/2011   SOB (shortness of breath) 10/02/2011   BPH with urinary obstruction 07/17/2010   Diabetes mellitus type 2, controlled (HCC) 07/17/2010   Bilateral lower extremity edema 12/08/2008   NEUROPATHY, IDIOPATHIC PERIPHERAL NEC 05/31/2007   Osteoarthritis 03/22/2007   Gout 03/15/2007   Hypertension associated with diabetes (HCC) 03/15/2007   03/17/2007, PT, DPT, OCS, ATC 03/20/21  4:43  PM    [facility] Physical Therapy [address] [place], [place], [address] Phone: [phone]   Fax:  [phone]  Name: Franklin Hicks MRN: [id number] Date of  Birth: February 22, 1951

## 2021-03-25 ENCOUNTER — Encounter: Payer: Medicare PPO | Admitting: Rehabilitative and Restorative Service Providers"

## 2021-03-27 ENCOUNTER — Encounter: Payer: Medicare PPO | Admitting: Rehabilitative and Restorative Service Providers"

## 2021-04-01 ENCOUNTER — Ambulatory Visit: Payer: Medicare PPO | Admitting: Occupational Therapy

## 2021-04-02 ENCOUNTER — Other Ambulatory Visit: Payer: Self-pay

## 2021-04-02 ENCOUNTER — Ambulatory Visit: Payer: Medicare PPO | Admitting: Rehabilitative and Restorative Service Providers"

## 2021-04-02 ENCOUNTER — Encounter: Payer: Self-pay | Admitting: Rehabilitative and Restorative Service Providers"

## 2021-04-02 DIAGNOSIS — M25552 Pain in left hip: Secondary | ICD-10-CM

## 2021-04-02 DIAGNOSIS — M6281 Muscle weakness (generalized): Secondary | ICD-10-CM | POA: Diagnosis not present

## 2021-04-02 DIAGNOSIS — R262 Difficulty in walking, not elsewhere classified: Secondary | ICD-10-CM | POA: Diagnosis not present

## 2021-04-02 DIAGNOSIS — M79605 Pain in left leg: Secondary | ICD-10-CM | POA: Diagnosis not present

## 2021-04-02 NOTE — Therapy (Signed)
[facility] [address] [place], [place], [address] Phone: [phone]   Fax:  [phone]  Physical Therapy Treatment  Patient Details  Name: Franklin Hicks MRN: [id number] Date of Birth: 05-Oct-1950 Referring Provider (PT): Dr. Clementeen Graham   Encounter Date: 04/02/2021   PT End of Session - 04/02/21 1204     Visit Number 6    Number of Visits 12    Date for PT Re-Evaluation 04/29/21   extended for humana authorization   Authorization Type HUMANA - asking for 12 visits through 04/29/2021    Authorization - Visit Number 6    Authorization - Number of Visits 12    Progress Note Due on Visit 10    PT Start Time 1159   arrival late   PT Stop Time 1226    PT Time Calculation (min) 27 min    Activity Tolerance Patient tolerated treatment well    Behavior During Therapy [facility] for tasks assessed/performed             Past Medical History:  Diagnosis Date   Arthritis    "knees" (12/17/2017)   CAD S/P percutaneous coronary angioplasty 09/20/2016   Nstemi 08/2016. Dr. Algie Coffer. DES- brilinta and asa. Requests change to [facility] cardiology; 95% Ramus -> PCI Resolute Onyx DES 2.75 x 18   Central cord syndrome (HCC) 12/17/2017   Chronic combined systolic and diastolic heart failure (HCC) 10/09/2016   EF 45% and grade II diastolic after nstemi   Diet-controlled diabetes mellitus (HCC)    DJD (degenerative joint disease)    Gout    High cholesterol    Hypertension    NSTEMI (non-ST elevated myocardial infarction) (HCC) 09/20/2016   95% Ramus - > PCI    Obesity    Recurrent pulmonary embolism (HCC) 11/'14; 4/'19   a) Bilateral segmental and subsegmental pulmonary emboli with mild RV Strain.; b) after fall with C-spine Fxr --> Acute PE of right main pulmonary artery extending into multiple segments.    Past Surgical History:  Procedure Laterality Date   CARDIAC CATHETERIZATION N/A 09/19/2016   Procedure: Left Heart Cath and Coronary Angiography;  Surgeon: Orpah Cobb, MD;  [facility]: [facility];  Service: Cardiovascular: 95% proximal Ramus Intermedius --> PCI   CARDIAC CATHETERIZATION N/A 09/19/2016   Procedure: Coronary Stent Intervention;  Surgeon: Yvonne Kendall, MD;  Location: [facility] INVASIVE CV LAB;  Service: Cardiovascular: 95% ramus intermedius;  Resolute Onyx 2.75 x 18 mm drug-eluting stent   CYSTOSCOPY/RETROGRADE/URETEROSCOPY/STONE EXTRACTION WITH BASKET  9470,9628   ureteral stone    MENISCUS REPAIR Left    knee open meniscetomy   TRANSTHORACIC ECHOCARDIOGRAM  2004   no lvh nl ejection fraction   TRANSTHORACIC ECHOCARDIOGRAM  09/19/2016   In setting of NSTEMI:  EF 45-50% with diffuse hypokinesis. GR 2 DD. Mild biatrial enlargement.    There were no vitals filed for this visit.   Subjective Assessment - 04/02/21 1203     Subjective Pt. indicated no pain upon arrival in hip.  Pt. indicated pain maybe a 2/10 at worst since last visit.  Pt. indicated he has been going up ramp in last few days.    Pertinent History CHF, CAD, DJD, DM, HTN, High cholesterol, obesity.  Bilateral knee pain in past    Limitations Walking;House hold activities;Standing;Lifting    Diagnostic tests xray    Patient Stated Goals Reduce pain    Currently in Pain? No/denies    Pain Score 0-No pain    Pain  Onset More than a month ago                [facility] PT Assessment - 04/02/21 0001       Assessment   Medical Diagnosis Lt hip pain    Referring Provider (PT) Dr. Clementeen Graham    Onset Date/Surgical Date 01/16/21    Hand Dominance Right      Observation/Other Assessments   Focus on Therapeutic Outcomes (FOTO)  update 50%                           [facility] Adult PT Treatment/Exercise - 04/02/21 0001       Therapeutic Activites    Other Therapeutic Activities stand pivot transfer x 4 (2 left, 2 right).  multiple attempts required on performance but able to complete c supervision and additional time.  FWW ambulation SBA 30 ft       Knee/Hip Exercises: Aerobic   Nustep Lvl 6 10 mins, Lvl 5      Knee/Hip Exercises: Machines for Strengthening   Cybex Leg Press DL 50 lbs 2 x 15, SL 31 lbs 2 x 15 bilateral                      PT Short Term Goals - 03/18/21 1638       PT SHORT TERM GOAL #1   Title Patient will demonstrate independent use of home exercise program to maintain progress from in clinic treatments.    Time 3    Period Weeks    Status On-going    Target Date 04/01/21               PT Long Term Goals - 04/02/21 1208       PT LONG TERM GOAL #1   Title Patient will demonstrate/report pain at worst less than or equal to 2/10 to facilitate minimal limitation in daily activity secondary to pain symptoms.    Time 10    Period Weeks    Status On-going    Target Date 04/29/21      PT LONG TERM GOAL #2   Title v    Time 10    Period Weeks    Status On-going    Target Date 04/29/21      PT LONG TERM GOAL #3   Title Pt. will demonstrate FOTO outcome > or = 68 to indicated reduced disability due to condtion.    Time 10    Period Weeks    Status On-going    Target Date 04/29/21      PT LONG TERM GOAL #4   Title Pt. will demonstrate Lt hip flexion MMT 5/5 without pain to facilitate usual daily activity at PLOF.    Time 10    Period Weeks    Status On-going    Target Date 04/29/21      PT LONG TERM GOAL #5   Title Pt. will demonstrate ability to perform sit to stand to sit from wheelchair, transfers from wheelchair to bed on first attempt c use of Lt UE to facilitate mobility at PLOF.    Time 10    Period Weeks    Status On-going    Target Date 04/29/21      PT LONG TERM GOAL #6   Title Pt. will demonstrate ability to ambulate modified independent with walker > 300 ft to facilitate household and basic community ambulation.    Time 10  Period Weeks    Status On-going    Target Date 04/29/21                   Plan - 04/02/21 1205     Clinical Impression  Statement Ambulation/standing still restricted due to Rt wrist WB precautions. Discussed c Pt. about asking about preacautions for Rt arm WB in future MD visit.  Pt. to benefit from ability to perform more upright posture c hand WB in future which can improve distance of ambulation, frequency of ambulation to promote mobility outside of wheelchair.    Personal Factors and Comorbidities Comorbidity 3+    Comorbidities CHF, CAD, DJD, DM, HTN, High cholesterol, obesity    Examination-Activity Limitations Locomotion Level;Lift;Transfers;Stand;Carry;Stairs;Squat;Bend;Bed Mobility    Examination-Participation Restrictions Community Activity    Stability/Clinical Decision Making Evolving/Moderate complexity    Rehab Potential Fair    PT Frequency 2x / week    PT Duration Other (comment)   10 weeks (extended for [facility] certification)   PT Treatment/Interventions ADLs/Self Care Home Management;Cryotherapy;Electrical Stimulation;Therapeutic activities;Iontophoresis 4mg /ml Dexamethasone;Moist Heat;Traction;Balance training;Therapeutic exercise;Functional mobility training;Stair training;Gait training;DME Instruction;Ultrasound;Neuromuscular re-education;Patient/family education;Passive range of motion;Spinal Manipulations;Joint Manipulations;Dry needling;Taping;Manual techniques    PT Next Visit Plan Percussive device as needed, continued standing balance improvements, tolerance to upright positioning, LE strengthening.    PT Home Exercise Plan seated marching, seated SLR, seated hip abduction    Consulted and Agree with Plan of Care Patient             Patient will benefit from skilled therapeutic intervention in order to improve the following deficits and impairments:  Abnormal gait, Decreased endurance, Hypomobility, Obesity, Pain, Increased fascial restricitons, Decreased strength, Decreased activity tolerance, Decreased balance, Decreased mobility, Difficulty walking, Impaired perceived functional  ability, Improper body mechanics, Impaired flexibility, Postural dysfunction, Decreased coordination, Decreased range of motion  Visit Diagnosis: Pain in left hip  Pain in left leg  Difficulty in walking, not elsewhere classified  Muscle weakness (generalized)     Problem List Patient Active Problem List   Diagnosis Date Noted   Preoperative cardiovascular examination 02/05/2021   Recurrent pulmonary embolism (HCC)    Pain of left heel    C4 spinal cord injury, sequela (HCC) 12/18/2017   Tetraplegia (HCC) 12/18/2017   Aortic aneurysm (HCC) 12/16/2017   Cord compression (HCC) 12/13/2017   Chronic combined systolic and diastolic heart failure (HCC) 10/09/2016   CAD S/P percutaneous coronary angioplasty 09/20/2016   Primary osteoarthritis of knees, bilateral 01/11/2016   Erectile dysfunction 07/30/2015   Morbid obesity (HCC) 07/18/2013   Angioedema of lips 07/13/2013   Multinodular goiter 07/13/2013   Hyperlipidemia associated with type 2 diabetes mellitus (HCC) 10/02/2011   SOB (shortness of breath) 10/02/2011   BPH with urinary obstruction 07/17/2010   Diabetes mellitus type 2, controlled (HCC) 07/17/2010   Bilateral lower extremity edema 12/08/2008   NEUROPATHY, IDIOPATHIC PERIPHERAL NEC 05/31/2007   Osteoarthritis 03/22/2007   Gout 03/15/2007   Hypertension associated with diabetes (HCC) 03/15/2007   03/17/2007, PT, DPT, OCS, ATC 04/02/21  12:30 PM    [facility] [address] [place], [place], [place] Phone: [phone]   Fax:  [phone]  Name: KAIDON KINKER MRN: Berna Spare Date of Birth: 1951-03-29

## 2021-04-08 ENCOUNTER — Encounter: Payer: Self-pay | Admitting: Rehabilitative and Restorative Service Providers"

## 2021-04-08 ENCOUNTER — Ambulatory Visit: Payer: Medicare PPO | Admitting: Rehabilitative and Restorative Service Providers"

## 2021-04-08 ENCOUNTER — Other Ambulatory Visit: Payer: Self-pay

## 2021-04-08 DIAGNOSIS — M25552 Pain in left hip: Secondary | ICD-10-CM

## 2021-04-08 DIAGNOSIS — M6281 Muscle weakness (generalized): Secondary | ICD-10-CM | POA: Diagnosis not present

## 2021-04-08 DIAGNOSIS — M79605 Pain in left leg: Secondary | ICD-10-CM | POA: Diagnosis not present

## 2021-04-08 DIAGNOSIS — R262 Difficulty in walking, not elsewhere classified: Secondary | ICD-10-CM | POA: Diagnosis not present

## 2021-04-08 NOTE — Therapy (Signed)
[facility] [address] [place], [place], [address] Phone: [phone]   Fax:  [phone]  Physical Therapy Treatment  Patient Details  Name: Franklin Hicks MRN: [id number] Date of Birth: [birth year] Referring Provider (PT): Dr. Clementeen Graham   Encounter Date: 04/08/2021   PT End of Session - 04/08/21 1208     Visit Number 7    Number of Visits 12    Date for PT Re-Evaluation 04/29/21   extended for humana authorization   Authorization Type HUMANA - asking for 12 visits through 04/29/2021    Authorization - Visit Number 7    Authorization - Number of Visits 12    Progress Note Due on Visit 10    PT Start Time 1145    PT Stop Time 1224    PT Time Calculation (min) 39 min    Activity Tolerance Patient tolerated treatment well    Behavior During Therapy [facility] for tasks assessed/performed             Past Medical History:  Diagnosis Date   Arthritis    "knees" (12/17/2017)   CAD S/P percutaneous coronary angioplasty 09/20/2016   Nstemi 08/2016. Dr. Algie Coffer. DES- brilinta and asa. Requests change to [facility] cardiology; 95% Ramus -> PCI Resolute Onyx DES 2.75 x 18   Central cord syndrome (HCC) 12/17/2017   Chronic combined systolic and diastolic heart failure (HCC) 10/09/2016   EF 45% and grade II diastolic after nstemi   Diet-controlled diabetes mellitus (HCC)    DJD (degenerative joint disease)    Gout    High cholesterol    Hypertension    NSTEMI (non-ST elevated myocardial infarction) (HCC) 09/20/2016   95% Ramus - > PCI    Obesity    Recurrent pulmonary embolism (HCC) 11/'14; 4/'19   a) Bilateral segmental and subsegmental pulmonary emboli with mild RV Strain.; b) after fall with C-spine Fxr --> Acute PE of right main pulmonary artery extending into multiple segments.    Past Surgical History:  Procedure Laterality Date   CARDIAC CATHETERIZATION N/A 09/19/2016   Procedure: Left Heart Cath and Coronary Angiography;  Surgeon: Orpah Cobb, MD;   [facility];  Service: Cardiovascular: 95% proximal Ramus Intermedius --> PCI   CARDIAC CATHETERIZATION N/A 09/19/2016   Procedure: Coronary Stent Intervention;  Surgeon: Yvonne Kendall, MD;  Location: [facility];  Service: Cardiovascular: 95% ramus intermedius;  Resolute Onyx 2.75 x 18 mm drug-eluting stent   CYSTOSCOPY/RETROGRADE/URETEROSCOPY/STONE EXTRACTION WITH BASKET  3220,2542   ureteral stone    MENISCUS REPAIR Left    knee open meniscetomy   TRANSTHORACIC ECHOCARDIOGRAM  2004   no lvh nl ejection fraction   TRANSTHORACIC ECHOCARDIOGRAM  09/19/2016   In setting of NSTEMI:  EF 45-50% with diffuse hypokinesis. GR 2 DD. Mild biatrial enlargement.    There were no vitals filed for this visit.       [facility] PT Assessment - 04/08/21 0001       Assessment   Medical Diagnosis Lt hip pain    Referring Provider (PT) Dr. Clementeen Graham    Onset Date/Surgical Date 01/16/21      Strength   Right Hip Flexion 5/5    Left Hip Flexion 4/5   21.1, 23.4 lbs   Left Knee Extension 4/5   40.5                          [facility] Adult PT Treatment/Exercise - 04/08/21  0001       Therapeutic Activites    Other Therapeutic Activities stand pivot tranfer 3 left, 3 right supervision, sit to stand from corner of 23.5 inch table height x 5      Knee/Hip Exercises: Aerobic   Nustep Lvl 5 10 mins UE/LE      Knee/Hip Exercises: Seated   Long Arc Quad Both   4 lb 2 x 15   Long Arc Quad Weight 4 lbs.    Other Seated Knee/Hip Exercises seated marching 2 x 10 bilateral                      PT Short Term Goals - 04/08/21 1258       PT SHORT TERM GOAL #1   Title Patient will demonstrate independent use of home exercise program to maintain progress from in clinic treatments.    Time 3    Period Weeks    Status Achieved    Target Date 04/01/21               PT Long Term Goals - 04/08/21 1224       PT LONG TERM GOAL #1   Title Patient  will demonstrate/report pain at worst less than or equal to 2/10 to facilitate minimal limitation in daily activity secondary to pain symptoms.    Time 10    Period Weeks    Status On-going      PT LONG TERM GOAL #2   Title Patient will demonstrate independent use of home exercise program to facilitate ability to maintain/progress functional gains from skilled physical therapy services.    Time 10    Period Weeks    Status On-going      PT LONG TERM GOAL #3   Title Pt. will demonstrate FOTO outcome > or = 68 to indicated reduced disability due to condtion.    Time 10    Period Weeks    Status On-going      PT LONG TERM GOAL #4   Title Pt. will demonstrate Lt hip flexion MMT 5/5 without pain to facilitate usual daily activity at PLOF.    Time 10    Period Weeks    Status On-going      PT LONG TERM GOAL #5   Title Pt. will demonstrate ability to perform sit to stand to sit from wheelchair, transfers from wheelchair to bed on first attempt c use of Lt UE to facilitate mobility at PLOF.    Time 10    Period Weeks    Status On-going      PT LONG TERM GOAL #6   Title Pt. will demonstrate ability to ambulate modified independent with walker > 300 ft to facilitate household and basic community ambulation.    Time 10    Period Weeks    Status On-going                   Plan - 04/08/21 1222     Clinical Impression Statement Discussion c Pt. about prognosis of plan of care.  Overall presentation of transfer movement limited in part due to overall knee mobility, specifically Lt side.  Although patient has demonstrated gains in strength as reported today, difficulty in transfers still noted due to this mobility deficits.  Capacity for mobility has improved to this point.  Continued skilled PT services indicated at this time.  Overall function to improve as WB in Rt UE allowed.    Personal  Factors and Comorbidities Comorbidity 3+    Comorbidities CHF, CAD, DJD, DM, HTN, High  cholesterol, obesity    Examination-Activity Limitations Locomotion Level;Lift;Transfers;Stand;Carry;Stairs;Squat;Bend;Bed Mobility    Examination-Participation Restrictions Community Activity    Stability/Clinical Decision Making Evolving/Moderate complexity    Rehab Potential Fair    PT Frequency 2x / week    PT Duration Other (comment)   10 weeks (extended for [facility] certification)   PT Treatment/Interventions ADLs/Self Care Home Management;Cryotherapy;Electrical Stimulation;Therapeutic activities;Iontophoresis 4mg /ml Dexamethasone;Moist Heat;Traction;Balance training;Therapeutic exercise;Functional mobility training;Stair training;Gait training;DME Instruction;Ultrasound;Neuromuscular re-education;Patient/family education;Passive range of motion;Spinal Manipulations;Joint Manipulations;Dry needling;Taping;Manual techniques    PT Next Visit Plan continued standing balance improvements, tolerance to upright positioning, LE strengthening.    PT Home Exercise Plan seated marching, seated SLR, seated hip abduction    Consulted and Agree with Plan of Care Patient             Patient will benefit from skilled therapeutic intervention in order to improve the following deficits and impairments:  Abnormal gait, Decreased endurance, Hypomobility, Obesity, Pain, Increased fascial restricitons, Decreased strength, Decreased activity tolerance, Decreased balance, Decreased mobility, Difficulty walking, Impaired perceived functional ability, Improper body mechanics, Impaired flexibility, Postural dysfunction, Decreased coordination, Decreased range of motion  Visit Diagnosis: Pain in left hip  Pain in left leg  Difficulty in walking, not elsewhere classified  Muscle weakness (generalized)     Problem List Patient Active Problem List   Diagnosis Date Noted   Preoperative cardiovascular examination 02/05/2021   Recurrent pulmonary embolism (HCC)    Pain of left heel    C4 spinal cord injury,  sequela (HCC) 12/18/2017   Tetraplegia (HCC) 12/18/2017   Aortic aneurysm (HCC) 12/16/2017   Cord compression (HCC) 12/13/2017   Chronic combined systolic and diastolic heart failure (HCC) 10/09/2016   CAD S/P percutaneous coronary angioplasty 09/20/2016   Primary osteoarthritis of knees, bilateral 01/11/2016   Erectile dysfunction 07/30/2015   Morbid obesity (HCC) 07/18/2013   Angioedema of lips 07/13/2013   Multinodular goiter 07/13/2013   Hyperlipidemia associated with type 2 diabetes mellitus (HCC) 10/02/2011   SOB (shortness of breath) 10/02/2011   BPH with urinary obstruction 07/17/2010   Diabetes mellitus type 2, controlled (HCC) 07/17/2010   Bilateral lower extremity edema 12/08/2008   NEUROPATHY, IDIOPATHIC PERIPHERAL NEC 05/31/2007   Osteoarthritis 03/22/2007   Gout 03/15/2007   Hypertension associated with diabetes (HCC) 03/15/2007   03/17/2007, PT, DPT, OCS, ATC 04/08/21  12:59 PM    [facility] [facility] [address] [place], [place], [place] Phone: [phone]   Fax:  [phone]  Name: Franklin Hicks MRN: Berna Spare Date of Birth: 16-Oct-1950

## 2021-04-10 ENCOUNTER — Encounter: Payer: Self-pay | Admitting: Rehabilitative and Restorative Service Providers"

## 2021-04-10 ENCOUNTER — Ambulatory Visit: Payer: Medicare PPO | Admitting: Rehabilitative and Restorative Service Providers"

## 2021-04-10 ENCOUNTER — Other Ambulatory Visit: Payer: Self-pay

## 2021-04-10 DIAGNOSIS — M25552 Pain in left hip: Secondary | ICD-10-CM | POA: Diagnosis not present

## 2021-04-10 DIAGNOSIS — R262 Difficulty in walking, not elsewhere classified: Secondary | ICD-10-CM

## 2021-04-10 DIAGNOSIS — M79605 Pain in left leg: Secondary | ICD-10-CM

## 2021-04-10 DIAGNOSIS — M6281 Muscle weakness (generalized): Secondary | ICD-10-CM | POA: Diagnosis not present

## 2021-04-10 NOTE — Therapy (Signed)
[facility] [address] [place], [place], [address] Phone: [phone]   Fax:  [phone]  Physical Therapy Treatment  Patient Details  Name: Franklin Hicks MRN: [id number] Date of Birth: 07/22/1951 Referring Provider (PT): Dr. Clementeen Graham   Encounter Date: 04/10/2021   PT End of Session - 04/10/21 1143     Visit Number 8    Number of Visits 12    Date for PT Re-Evaluation 04/29/21   extended for human[employer] authorization   Authorization Type HUMANA - asking for 12 visits through 04/29/2021    Authorization - Visit Number 8    Authorization - Number of Visits 12    Progress Note Due on Visit 10    PT Start Time 1144    PT Stop Time 1224    PT Time Calculation (min) 40 min    Activity Tolerance Patient tolerated treatment well    Behavior During Therapy [facility] for tasks assessed/performed             Past Medical History:  Diagnosis Date   Arthritis    "knees" (12/17/2017)   CAD S/P percutaneous coronary angioplasty 09/20/2016   Nstemi 08/2016. Dr. Algie Coffer. DES- brilinta and asa. Requests change to [facility] cardiology; 95% Ramus -> PCI Resolute Onyx DES 2.75 x 18   Central cord syndrome (HCC) 12/17/2017   Chronic combined systolic and diastolic heart failure (HCC) 10/09/2016   EF 45% and grade II diastolic after nstemi   Diet-controlled diabetes mellitus (HCC)    DJD (degenerative joint disease)    Gout    High cholesterol    Hypertension    NSTEMI (non-ST elevated myocardial infarction) (HCC) 09/20/2016   95% Ramus - > PCI    Obesity    Recurrent pulmonary embolism (HCC) 11/'14; 4/'19   a) Bilateral segmental and subsegmental pulmonary emboli with mild RV Strain.; b) after fall with C-spine Fxr --> Acute PE of right main pulmonary artery extending into multiple segments.    Past Surgical History:  Procedure Laterality Date   CARDIAC CATHETERIZATION N/A 09/19/2016   Procedure: Left Heart Cath and Coronary Angiography;  Surgeon: Orpah Cobb, MD;   Location: [facility] INVASIVE CV LAB;  Service: Cardiovascular: 95% proximal Ramus Intermedius --> PCI   CARDIAC CATHETERIZATION N/A 09/19/2016   Procedure: Coronary Stent Intervention;  Surgeon: Yvonne Kendall, MD;  Location: [facility] INVASIVE CV LAB;  Service: Cardiovascular: 95% ramus intermedius;  Resolute Onyx 2.75 x 18 mm drug-eluting stent   CYSTOSCOPY/RETROGRADE/URETEROSCOPY/STONE EXTRACTION WITH BASKET  9021,1155   ureteral stone    MENISCUS REPAIR Left    knee open meniscetomy   TRANSTHORACIC ECHOCARDIOGRAM  2004   no lvh nl ejection fraction   TRANSTHORACIC ECHOCARDIOGRAM  09/19/2016   In setting of NSTEMI:  EF 45-50% with diffuse hypokinesis. GR 2 DD. Mild biatrial enlargement.    There were no vitals filed for this visit.   Subjective Assessment - 04/10/21 1149     Subjective Pt. indicated continued improvements in hip pain that haven't reversed since improvements from percussive device use.    Pertinent History CHF, CAD, DJD, DM, HTN, High cholesterol, obesity.  Bilateral knee pain in past    Limitations Walking;House hold activities;Standing;Lifting    Diagnostic tests xray    Patient Stated Goals Reduce pain    Currently in Pain? No/denies    Pain Onset More than a month ago  [facility] Adult PT Treatment/Exercise - 04/10/21 0001       Therapeutic Activites    Other Therapeutic Activities stand pivot tranfer 4 left, 4 right supervision      Knee/Hip Exercises: Aerobic   Nustep Lvl 6 12 mins      Knee/Hip Exercises: Machines for Strengthening   Cybex Leg Press DL 56 lbs 2 x 15, SL 31 lbs 2 x 15 bilateral      Knee/Hip Exercises: Seated   Long Arc Quad Both;3 sets;10 reps    Long Arc Quad Weight 4 lbs.                      PT Short Term Goals - 04/08/21 1258       PT SHORT TERM GOAL #1   Title Patient will demonstrate independent use of home exercise program to maintain progress from in clinic treatments.     Time 3    Period Weeks    Status Achieved    Target Date 04/01/21               PT Long Term Goals - 04/08/21 1224       PT LONG TERM GOAL #1   Title Patient will demonstrate/report pain at worst less than or equal to 2/10 to facilitate minimal limitation in daily activity secondary to pain symptoms.    Time 10    Period Weeks    Status On-going      PT LONG TERM GOAL #2   Title Patient will demonstrate independent use of home exercise program to facilitate ability to maintain/progress functional gains from skilled physical therapy services.    Time 10    Period Weeks    Status On-going      PT LONG TERM GOAL #3   Title Pt. will demonstrate FOTO outcome > or = 68 to indicated reduced disability due to condtion.    Time 10    Period Weeks    Status On-going      PT LONG TERM GOAL #4   Title Pt. will demonstrate Lt hip flexion MMT 5/5 without pain to facilitate usual daily activity at PLOF.    Time 10    Period Weeks    Status On-going      PT LONG TERM GOAL #5   Title Pt. will demonstrate ability to perform sit to stand to sit from wheelchair, transfers from wheelchair to bed on first attempt c use of Lt UE to facilitate mobility at PLOF.    Time 10    Period Weeks    Status On-going      PT LONG TERM GOAL #6   Title Pt. will demonstrate ability to ambulate modified independent with walker > 300 ft to facilitate household and basic community ambulation.    Time 10    Period Weeks    Status On-going                   Plan - 04/10/21 1302     Clinical Impression Statement Pt. appeared to have increased difficulty in transfers performed today.  LAQ movement against resistance was improved some on Lt leg today.  Continued strengthening and progress mobility improvements necessary.    Personal Factors and Comorbidities Comorbidity 3+    Comorbidities CHF, CAD, DJD, DM, HTN, High cholesterol, obesity    Examination-Activity Limitations Locomotion  Level;Lift;Transfers;Stand;Carry;Stairs;Squat;Bend;Bed Mobility    Examination-Participation Restrictions Community Activity    Stability/Clinical Decision Making Evolving/Moderate complexity  Rehab Potential Fair    PT Frequency 2x / week    PT Duration Other (comment)   10 weeks (extended for [facility] certification)   PT Treatment/Interventions ADLs/Self Care Home Management;Cryotherapy;Electrical Stimulation;Therapeutic activities;Iontophoresis 4mg /ml Dexamethasone;Moist Heat;Traction;Balance training;Therapeutic exercise;Functional mobility training;Stair training;Gait training;DME Instruction;Ultrasound;Neuromuscular re-education;Patient/family education;Passive range of motion;Spinal Manipulations;Joint Manipulations;Dry needling;Taping;Manual techniques    PT Next Visit Plan continued standing balance improvements, tolerance to upright positioning, LE strengthening.  WB on Rt arm when allowed for full ambulation return.    PT Home Exercise Plan seated marching, seated SLR, seated hip abduction    Consulted and Agree with Plan of Care Patient             Patient will benefit from skilled therapeutic intervention in order to improve the following deficits and impairments:  Abnormal gait, Decreased endurance, Hypomobility, Obesity, Pain, Increased fascial restricitons, Decreased strength, Decreased activity tolerance, Decreased balance, Decreased mobility, Difficulty walking, Impaired perceived functional ability, Improper body mechanics, Impaired flexibility, Postural dysfunction, Decreased coordination, Decreased range of motion  Visit Diagnosis: Pain in left hip  Pain in left leg  Difficulty in walking, not elsewhere classified  Muscle weakness (generalized)     Problem List Patient Active Problem List   Diagnosis Date Noted   Preoperative cardiovascular examination 02/05/2021   Recurrent pulmonary embolism (HCC)    Pain of left heel    C4 spinal cord injury, sequela (HCC)  12/18/2017   Tetraplegia (HCC) 12/18/2017   Aortic aneurysm (HCC) 12/16/2017   Cord compression (HCC) 12/13/2017   Chronic combined systolic and diastolic heart failure (HCC) 10/09/2016   CAD S/P percutaneous coronary angioplasty 09/20/2016   Primary osteoarthritis of knees, bilateral 01/11/2016   Erectile dysfunction 07/30/2015   Morbid obesity (HCC) 07/18/2013   Angioedema of lips 07/13/2013   Multinodular goiter 07/13/2013   Hyperlipidemia associated with type 2 diabetes mellitus (HCC) 10/02/2011   SOB (shortness of breath) 10/02/2011   BPH with urinary obstruction 07/17/2010   Diabetes mellitus type 2, controlled (HCC) 07/17/2010   Bilateral lower extremity edema 12/08/2008   NEUROPATHY, IDIOPATHIC PERIPHERAL NEC 05/31/2007   Osteoarthritis 03/22/2007   Gout 03/15/2007   Hypertension associated with diabetes (HCC) 03/15/2007    03/17/2007, PT, DPT, OCS, ATC 04/10/21  1:05 PM    [facility] [address] [place], [place], [place] Phone: [phone]   Fax:  [phone]  Name: LONZIE SIMMER MRN: Berna Spare Date of Birth: 07/24/1951

## 2021-04-11 ENCOUNTER — Encounter: Payer: Self-pay | Admitting: Occupational Therapy

## 2021-04-11 ENCOUNTER — Ambulatory Visit: Payer: Medicare PPO | Attending: Plastic Surgery | Admitting: Occupational Therapy

## 2021-04-11 DIAGNOSIS — M6281 Muscle weakness (generalized): Secondary | ICD-10-CM | POA: Insufficient documentation

## 2021-04-11 DIAGNOSIS — R278 Other lack of coordination: Secondary | ICD-10-CM | POA: Diagnosis not present

## 2021-04-11 DIAGNOSIS — M25631 Stiffness of right wrist, not elsewhere classified: Secondary | ICD-10-CM | POA: Insufficient documentation

## 2021-04-11 DIAGNOSIS — R262 Difficulty in walking, not elsewhere classified: Secondary | ICD-10-CM | POA: Insufficient documentation

## 2021-04-11 DIAGNOSIS — M25531 Pain in right wrist: Secondary | ICD-10-CM | POA: Diagnosis not present
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
## 2021-04-15 ENCOUNTER — Encounter: Payer: Self-pay | Admitting: Rehabilitative and Restorative Service Providers"

## 2021-04-15 ENCOUNTER — Other Ambulatory Visit: Payer: Self-pay

## 2021-04-15 ENCOUNTER — Ambulatory Visit: Payer: Medicare PPO | Admitting: Rehabilitative and Restorative Service Providers"

## 2021-04-15 DIAGNOSIS — M79605 Pain in left leg: Secondary | ICD-10-CM | POA: Diagnosis not present

## 2021-04-15 DIAGNOSIS — M6281 Muscle weakness (generalized): Secondary | ICD-10-CM | POA: Diagnosis not present

## 2021-04-15 DIAGNOSIS — R262 Difficulty in walking, not elsewhere classified: Secondary | ICD-10-CM | POA: Diagnosis not present

## 2021-04-15 DIAGNOSIS — M25552 Pain in left hip: Secondary | ICD-10-CM | POA: Diagnosis not present

## 2021-04-15 NOTE — Therapy (Signed)
[facility] Physical Therapy [address] [place], [place], [address] Phone: [phone]   Fax:  [phone]  Physical Therapy Treatment  Patient Details  Name: Franklin Hicks MRN: [id number] Date of Birth: 21-Apr-1951 Referring Provider (PT): Dr. Clementeen Graham   Encounter Date: 04/15/2021   PT End of Session - 04/15/21 1153     Visit Number 9    Number of Visits 12    Date for PT Re-Evaluation 04/29/21   extended for humana authorization   Authorization Type HUMANA - asking for 12 visits through 04/29/2021    Authorization - Visit Number 9    Authorization - Number of Visits 12    Progress Note Due on Visit 10    PT Start Time 1140    PT Stop Time 1220    PT Time Calculation (min) 40 min    Activity Tolerance Patient tolerated treatment well    Behavior During Therapy [facility] for tasks assessed/performed             Past Medical History:  Diagnosis Date   Arthritis    "knees" (12/17/2017)   CAD S/P percutaneous coronary angioplasty 09/20/2016   Nstemi 08/2016. Dr. Algie Coffer. DES- brilinta and asa. Requests change to [facility] cardiology; 95% Ramus -> PCI Resolute Onyx DES 2.75 x 18   Central cord syndrome (HCC) 12/17/2017   Chronic combined systolic and diastolic heart failure (HCC) 10/09/2016   EF 45% and grade II diastolic after nstemi   Diet-controlled diabetes mellitus (HCC)    DJD (degenerative joint disease)    Gout    High cholesterol    Hypertension    NSTEMI (non-ST elevated myocardial infarction) (HCC) 09/20/2016   95% Ramus - > PCI    Obesity    Recurrent pulmonary embolism (HCC) 11/'14; 4/'19   a) Bilateral segmental and subsegmental pulmonary emboli with mild RV Strain.; b) after fall with C-spine Fxr --> Acute PE of right main pulmonary artery extending into multiple segments.    Past Surgical History:  Procedure Laterality Date   CARDIAC CATHETERIZATION N/A 09/19/2016   Procedure: Left Heart Cath and Coronary Angiography;  Surgeon: Orpah Cobb, MD;   [facility];  Service: Cardiovascular: 95% proximal Ramus Intermedius --> PCI   CARDIAC CATHETERIZATION N/A 09/19/2016   Procedure: Coronary Stent Intervention;  Surgeon: Yvonne Kendall, MD;  Location: [facility] INVASIVE CV LAB;  Service: Cardiovascular: 95% ramus intermedius;  Resolute Onyx 2.75 x 18 mm drug-eluting stent   CYSTOSCOPY/RETROGRADE/URETEROSCOPY/STONE EXTRACTION WITH BASKET  3536,1443   ureteral stone    MENISCUS REPAIR Left    knee open meniscetomy   TRANSTHORACIC ECHOCARDIOGRAM  2004   no lvh nl ejection fraction   TRANSTHORACIC ECHOCARDIOGRAM  09/19/2016   In setting of NSTEMI:  EF 45-50% with diffuse hypokinesis. GR 2 DD. Mild biatrial enlargement.    There were no vitals filed for this visit.   Subjective Assessment - 04/15/21 1145     Subjective Pt. stated no hip pain today.  no other specific complications different from previous visits.  Still holding off on full pressure on wrist.    Pertinent History CHF, CAD, DJD, DM, HTN, High cholesterol, obesity.  Bilateral knee pain in past    Limitations Walking;House hold activities;Standing;Lifting    Diagnostic tests xray    Patient Stated Goals Reduce pain    Currently in Pain? Yes    Pain Score 0-No pain    Pain Onset More than a month ago  [facility] Adult PT Treatment/Exercise - 04/15/21 0001       Therapeutic Activites    Other Therapeutic Activities 24 inch table no UE x 10 sit to stand to sits, stand pivot transfer c supervision x 2 Lt and x2 Rt.  FWW distances training 50 ft x 2.  in // bars standing c toe tap 2 inch foam x 12 bilateral c CGA, standing upright no UE assist 20 sec x 3      Knee/Hip Exercises: Aerobic   Nustep Lvl 6 12 mins UE/LE      Knee/Hip Exercises: Seated   Long Arc Quad Both;2 sets;15 reps    Long Arc Quad Weight 3 lbs.                      PT Short Term Goals - 04/08/21 1258       PT SHORT TERM GOAL #1    Title Patient will demonstrate independent use of home exercise program to maintain progress from in clinic treatments.    Time 3    Period Weeks    Status Achieved    Target Date 04/01/21               PT Long Term Goals - 04/08/21 1224       PT LONG TERM GOAL #1   Title Patient will demonstrate/report pain at worst less than or equal to 2/10 to facilitate minimal limitation in daily activity secondary to pain symptoms.    Time 10    Period Weeks    Status On-going      PT LONG TERM GOAL #2   Title Patient will demonstrate independent use of home exercise program to facilitate ability to maintain/progress functional gains from skilled physical therapy services.    Time 10    Period Weeks    Status On-going      PT LONG TERM GOAL #3   Title Pt. will demonstrate FOTO outcome > or = 68 to indicated reduced disability due to condtion.    Time 10    Period Weeks    Status On-going      PT LONG TERM GOAL #4   Title Pt. will demonstrate Lt hip flexion MMT 5/5 without pain to facilitate usual daily activity at PLOF.    Time 10    Period Weeks    Status On-going      PT LONG TERM GOAL #5   Title Pt. will demonstrate ability to perform sit to stand to sit from wheelchair, transfers from wheelchair to bed on first attempt c use of Lt UE to facilitate mobility at PLOF.    Time 10    Period Weeks    Status On-going      PT LONG TERM GOAL #6   Title Pt. will demonstrate ability to ambulate modified independent with walker > 300 ft to facilitate household and basic community ambulation.    Time 10    Period Weeks    Status On-going                   Plan - 04/15/21 1305     Clinical Impression Statement Rt leg decreased step length and toe drag noted due to Lt LE WB difficulty most likely.  Lumbar restrictions impact upright standing posture time, secondarily due to extended times of seated and lumbar flexion due to use of modified forearm platform walker.     Personal Factors and Comorbidities Comorbidity 3+  Comorbidities CHF, CAD, DJD, DM, HTN, High cholesterol, obesity    Examination-Activity Limitations Locomotion Level;Lift;Transfers;Stand;Carry;Stairs;Squat;Bend;Bed Mobility    Examination-Participation Restrictions Community Activity    Stability/Clinical Decision Making Evolving/Moderate complexity    Rehab Potential Fair    PT Frequency 2x / week    PT Duration Other (comment)   10 weeks (extended for [facility] certification)   PT Treatment/Interventions ADLs/Self Care Home Management;Cryotherapy;Electrical Stimulation;Therapeutic activities;Iontophoresis 4mg /ml Dexamethasone;Moist Heat;Traction;Balance training;Therapeutic exercise;Functional mobility training;Stair training;Gait training;DME Instruction;Ultrasound;Neuromuscular re-education;Patient/family education;Passive range of motion;Spinal Manipulations;Joint Manipulations;Dry needling;Taping;Manual techniques    PT Next Visit Plan PN next visit    PT Home Exercise Plan seated marching, seated SLR, seated hip abduction    Consulted and Agree with Plan of Care Patient             Patient will benefit from skilled therapeutic intervention in order to improve the following deficits and impairments:  Abnormal gait, Decreased endurance, Hypomobility, Obesity, Pain, Increased fascial restricitons, Decreased strength, Decreased activity tolerance, Decreased balance, Decreased mobility, Difficulty walking, Impaired perceived functional ability, Improper body mechanics, Impaired flexibility, Postural dysfunction, Decreased coordination, Decreased range of motion  Visit Diagnosis: Pain in left hip  Pain in left leg  Muscle weakness (generalized)  Difficulty in walking, not elsewhere classified     Problem List Patient Active Problem List   Diagnosis Date Noted   Preoperative cardiovascular examination 02/05/2021   Recurrent pulmonary embolism (HCC)    Pain of left heel    C4  spinal cord injury, sequela (HCC) 12/18/2017   Tetraplegia (HCC) 12/18/2017   Aortic aneurysm (HCC) 12/16/2017   Cord compression (HCC) 12/13/2017   Chronic combined systolic and diastolic heart failure (HCC) 10/09/2016   CAD S/P percutaneous coronary angioplasty 09/20/2016   Primary osteoarthritis of knees, bilateral 01/11/2016   Erectile dysfunction 07/30/2015   Morbid obesity (HCC) 07/18/2013   Angioedema of lips 07/13/2013   Multinodular goiter 07/13/2013   Hyperlipidemia associated with type 2 diabetes mellitus (HCC) 10/02/2011   SOB (shortness of breath) 10/02/2011   BPH with urinary obstruction 07/17/2010   Diabetes mellitus type 2, controlled (HCC) 07/17/2010   Bilateral lower extremity edema 12/08/2008   NEUROPATHY, IDIOPATHIC PERIPHERAL NEC 05/31/2007   Osteoarthritis 03/22/2007   Gout 03/15/2007   Hypertension associated with diabetes (HCC) 03/15/2007   03/17/2007, PT, DPT, OCS, ATC 04/15/21  1:08 PM    [facility] Physical Therapy [address] [place], [place], [place] Phone: [phone]   Fax:  [phone]  Name: LONN IM MRN: Berna Spare Date of Birth: October 13, 1950

## 2021-04-17 ENCOUNTER — Other Ambulatory Visit: Payer: Self-pay

## 2021-04-17 ENCOUNTER — Ambulatory Visit: Payer: Medicare PPO | Admitting: Rehabilitative and Restorative Service Providers"

## 2021-04-17 ENCOUNTER — Encounter: Payer: Self-pay | Admitting: Rehabilitative and Restorative Service Providers"

## 2021-04-17 DIAGNOSIS — M25552 Pain in left hip: Secondary | ICD-10-CM | POA: Diagnosis not present

## 2021-04-17 DIAGNOSIS — R262 Difficulty in walking, not elsewhere classified: Secondary | ICD-10-CM

## 2021-04-17 DIAGNOSIS — M79605 Pain in left leg: Secondary | ICD-10-CM | POA: Diagnosis not present

## 2021-04-17 DIAGNOSIS — M6281 Muscle weakness (generalized): Secondary | ICD-10-CM

## 2021-04-17 NOTE — Therapy (Signed)
[facility] Physical Therapy [address] [place], [place], [address] Phone: [phone]   Fax:  [phone]  Physical Therapy Treatment/Progress Note  Patient Details  Name: Franklin Hicks MRN: [id number] Date of Birth: 1950-09-03 Referring Provider (PT): Dr. Clementeen Graham   Encounter Date: 04/17/2021  Progress Note Reporting Period 02/18/2021 to 04/17/2021  See note below for Objective Data and Assessment of Progress/Goals.       PT End of Session - 04/17/21 1156     Visit Number 10    Number of Visits 12    Date for PT Re-Evaluation 04/29/21   extended for humana authorization   Authorization Type HUMANA - asking for 12 visits through 04/29/2021    Authorization - Visit Number 10    Authorization - Number of Visits 12    Progress Note Due on Visit 12   Recert for HU[employer] at 12   PT Start Time 1146    PT Stop Time 1225    PT Time Calculation (min) 39 min    Activity Tolerance Patient tolerated treatment well    Behavior During Therapy [facility] for tasks assessed/performed             Past Medical History:  Diagnosis Date   Arthritis    "knees" (12/17/2017)   CAD S/P percutaneous coronary angioplasty 09/20/2016   Nstemi 08/2016. Dr. Algie Coffer. DES- brilinta and asa. Requests change to [facility] cardiology; 95% Ramus -> PCI Resolute Onyx DES 2.75 x 18   Central cord syndrome (HCC) 12/17/2017   Chronic combined systolic and diastolic heart failure (HCC) 10/09/2016   EF 45% and grade II diastolic after nstemi   Diet-controlled diabetes mellitus (HCC)    DJD (degenerative joint disease)    Gout    High cholesterol    Hypertension    NSTEMI (non-ST elevated myocardial infarction) (HCC) 09/20/2016   95% Ramus - > PCI    Obesity    Recurrent pulmonary embolism (HCC) 11/'14; 4/'19   a) Bilateral segmental and subsegmental pulmonary emboli with mild RV Strain.; b) after fall with C-spine Fxr --> Acute PE of right main pulmonary artery extending into multiple segments.     Past Surgical History:  Procedure Laterality Date   CARDIAC CATHETERIZATION N/A 09/19/2016   Procedure: Left Heart Cath and Coronary Angiography;  Surgeon: Orpah Cobb, MD;  Location: [facility];  Service: Cardiovascular: 95% proximal Ramus Intermedius --> PCI   CARDIAC CATHETERIZATION N/A 09/19/2016   Procedure: Coronary Stent Intervention;  Surgeon: Yvonne Kendall, MD;  Location: [facility];  Service: Cardiovascular: 95% ramus intermedius;  Resolute Onyx 2.75 x 18 mm drug-eluting stent   CYSTOSCOPY/RETROGRADE/URETEROSCOPY/STONE EXTRACTION WITH BASKET  8182,9937   ureteral stone    MENISCUS REPAIR Left    knee open meniscetomy   TRANSTHORACIC ECHOCARDIOGRAM  2004   no lvh nl ejection fraction   TRANSTHORACIC ECHOCARDIOGRAM  09/19/2016   In setting of NSTEMI:  EF 45-50% with diffuse hypokinesis. GR 2 DD. Mild biatrial enlargement.    There were no vitals filed for this visit.   Subjective Assessment - 04/17/21 1155     Subjective Pt. denied hip pain, still doing better.  A great deal better +6 reported on Global Rating of change.    Pertinent History CHF, CAD, DJD, DM, HTN, High cholesterol, obesity.  Bilateral knee pain in past    Limitations Walking;House hold activities;Standing;Lifting    Diagnostic tests xray    Patient Stated Goals Reduce pain    Currently in  Pain? No/denies    Pain Score 0-No pain    Pain Onset More than a month ago                [facility] PT Assessment - 04/17/21 0001       Assessment   Medical Diagnosis Lt hip pain    Referring Provider (PT) Dr. Clementeen Graham    Onset Date/Surgical Date 01/16/21      Observation/Other Assessments   Focus on Therapeutic Outcomes (FOTO)  update 50%      Strength   Left Hip Flexion 4+/5   34, 38 lbs   Left Knee Extension 4/5   38, 42 lbs     Ambulation/Gait   Gait Pattern Step-to pattern;Poor foot clearance - right;Shuffle;Decreased stance time - left;Decreased step length - right   decreased  gait speed                          [facility] Adult PT Treatment/Exercise - 04/17/21 0001       Ambulation/Gait   Gait Comments Ambulation c FWW c modified Rt forearm platform, 50 ft x 2 c SBA      Knee/Hip Exercises: Aerobic   Nustep Lvl 6 15 mins UE/LE      Knee/Hip Exercises: Machines for Strengthening   Cybex Leg Press DL 56 lbs 3 x 15, SL 37 lbs 3 x 15 each                      PT Short Term Goals - 04/08/21 1258       PT SHORT TERM GOAL #1   Title Patient will demonstrate independent use of home exercise program to maintain progress from in clinic treatments.    Time 3    Period Weeks    Status Achieved    Target Date 04/01/21               PT Long Term Goals - 04/17/21 1221       PT LONG TERM GOAL #1   Title Patient will demonstrate/report pain at worst less than or equal to 2/10 to facilitate minimal limitation in daily activity secondary to pain symptoms.    Time 10    Period Weeks    Status Achieved      PT LONG TERM GOAL #2   Title Patient will demonstrate independent use of home exercise program to facilitate ability to maintain/progress functional gains from skilled physical therapy services.    Time 10    Period Weeks    Status On-going    Target Date 04/29/21      PT LONG TERM GOAL #3   Title Pt. will demonstrate FOTO outcome > or = 68 to indicated reduced disability due to condtion.    Time 10    Period Weeks    Status On-going    Target Date 04/29/21      PT LONG TERM GOAL #4   Title Pt. will demonstrate Lt hip flexion MMT 5/5 without pain to facilitate usual daily activity at PLOF.    Time 10    Period Weeks    Status On-going    Target Date 04/29/21      PT LONG TERM GOAL #5   Title Pt. will demonstrate ability to perform sit to stand to sit from wheelchair, transfers from wheelchair to bed on first attempt c use of Lt UE to facilitate mobility at PLOF.  Time 10    Period Weeks    Status On-going     Target Date 04/29/21      PT LONG TERM GOAL #6   Title Pt. will demonstrate ability to ambulate modified independent with walker > 300 ft to facilitate household and basic community ambulation.    Time 10    Period Weeks    Status On-going    Target Date 04/29/21                   Plan - 04/17/21 1218     Clinical Impression Statement Pt. has attended 10 visits overall during course of treatment.  Noted improvement in subjective pain reporting to this point for Lt hip as well as self reported Global Rating of change +6 for hips at this time.  See objective data for updated information.  Noted improvement in strength and overall ambulation at this time compared to evaluation.  Pt. current presentation is partly limited due to medical history and restriction related to bilateral knee (specifically Lt) as well as Rt wrist WB status.  Pt. would benefit from continued skilled PT services to improve strength, progressive mobility independence for improved progress towards goals. 2 more approved HU[employer] visits remain at this time.    Personal Factors and Comorbidities Comorbidity 3+    Comorbidities CHF, CAD, DJD, DM, HTN, High cholesterol, obesity    Examination-Activity Limitations Locomotion Level;Lift;Transfers;Stand;Carry;Stairs;Squat;Bend;Bed Mobility    Examination-Participation Restrictions Community Activity    Stability/Clinical Decision Making Evolving/Moderate complexity    Rehab Potential Fair    PT Frequency 2x / week    PT Duration Other (comment)   10 weeks (extended for [facility] certification)   PT Treatment/Interventions ADLs/Self Care Home Management;Cryotherapy;Electrical Stimulation;Therapeutic activities;Iontophoresis 4mg /ml Dexamethasone;Moist Heat;Traction;Balance training;Therapeutic exercise;Functional mobility training;Stair training;Gait training;DME Instruction;Ultrasound;Neuromuscular re-education;Patient/family education;Passive range of motion;Spinal  Manipulations;Joint Manipulations;Dry needling;Taping;Manual techniques    PT Next Visit Plan Continue to progress leg strength, balance for ambulation    PT Home Exercise Plan seated marching, seated SLR, seated hip abduction    Consulted and Agree with Plan of Care Patient             Patient will benefit from skilled therapeutic intervention in order to improve the following deficits and impairments:  Abnormal gait, Decreased endurance, Hypomobility, Obesity, Pain, Increased fascial restricitons, Decreased strength, Decreased activity tolerance, Decreased balance, Decreased mobility, Difficulty walking, Impaired perceived functional ability, Improper body mechanics, Impaired flexibility, Postural dysfunction, Decreased coordination, Decreased range of motion  Visit Diagnosis: Pain in left hip  Pain in left leg  Muscle weakness (generalized)  Difficulty in walking, not elsewhere classified     Problem List Patient Active Problem List   Diagnosis Date Noted   Preoperative cardiovascular examination 02/05/2021   Recurrent pulmonary embolism (HCC)    Pain of left heel    C4 spinal cord injury, sequela (HCC) 12/18/2017   Tetraplegia (HCC) 12/18/2017   Aortic aneurysm (HCC) 12/16/2017   Cord compression (HCC) 12/13/2017   Chronic combined systolic and diastolic heart failure (HCC) 10/09/2016   CAD S/P percutaneous coronary angioplasty 09/20/2016   Primary osteoarthritis of knees, bilateral 01/11/2016   Erectile dysfunction 07/30/2015   Morbid obesity (HCC) 07/18/2013   Angioedema of lips 07/13/2013   Multinodular goiter 07/13/2013   Hyperlipidemia associated with type 2 diabetes mellitus (HCC) 10/02/2011   SOB (shortness of breath) 10/02/2011   BPH with urinary obstruction 07/17/2010   Diabetes mellitus type 2, controlled (HCC) 07/17/2010   Bilateral lower extremity edema 12/08/2008  NEUROPATHY, IDIOPATHIC PERIPHERAL NEC 05/31/2007   Osteoarthritis 03/22/2007   Gout  03/15/2007   Hypertension associated with diabetes (HCC) 03/15/2007    Chyrel MassonMichael Signora Zucco, PT, DPT, OCS, ATC 04/17/21  12:31 PM    [facility] [address] [place], [place], [address] Phone: [phone]   Fax:  [phone]  Name: Franklin Hicks MRN: [id number] Date of Birth: 06/13/1951

## 2021-04-18 ENCOUNTER — Ambulatory Visit: Payer: Medicare PPO | Admitting: Occupational Therapy

## 2021-04-18 DIAGNOSIS — R278 Other lack of coordination: Secondary | ICD-10-CM | POA: Diagnosis not present

## 2021-04-18 DIAGNOSIS — R262 Difficulty in walking, not elsewhere classified: Secondary | ICD-10-CM | POA: Diagnosis not present

## 2021-04-18 DIAGNOSIS — M25631 Stiffness of right wrist, not elsewhere classified: Secondary | ICD-10-CM

## 2021-04-18 DIAGNOSIS — M6281 Muscle weakness (generalized): Secondary | ICD-10-CM | POA: Diagnosis not present

## 2021-04-18 DIAGNOSIS — M25531 Pain in right wrist: Secondary | ICD-10-CM | POA: Diagnosis not present

## 2021-04-18 NOTE — Patient Instructions (Signed)
PROM: Wrist Flexion / Extension   Grasp  hand and slowly bend wrist until stretch is felt. Relax. Then stretch as far as possible in opposite direction. Be sure to keep elbow bent.  Hold __10__ sec. each way Repeat _5___ times per set.    Do _3-4__ sessions per day.  Pronation (Passive)   Keep elbow bent at right angle and held firmly to side. Use other hand to turn forearm until palm faces downward. Hold _10___ seconds. Repeat __5__ times. Do 3-4_ sessions per day.  Supination (Passive)   Keep elbow bent at right angle and held firmly at side. Use other hand to turn forearm until palm faces upward. Hold __10__ seconds. Repeat __5__ times. Do _3-4__ sessions per day.  Copyright  VHI. All rights reserved.
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
## 2021-04-22 ENCOUNTER — Other Ambulatory Visit: Payer: Self-pay

## 2021-04-22 ENCOUNTER — Ambulatory Visit: Payer: Medicare PPO | Admitting: Occupational Therapy

## 2021-04-22 ENCOUNTER — Ambulatory Visit: Payer: Medicare PPO | Admitting: Rehabilitative and Restorative Service Providers"

## 2021-04-22 ENCOUNTER — Encounter: Payer: Self-pay | Admitting: Rehabilitative and Restorative Service Providers"

## 2021-04-22 DIAGNOSIS — R262 Difficulty in walking, not elsewhere classified: Secondary | ICD-10-CM | POA: Diagnosis not present

## 2021-04-22 DIAGNOSIS — M79605 Pain in left leg: Secondary | ICD-10-CM | POA: Diagnosis not present

## 2021-04-22 DIAGNOSIS — M6281 Muscle weakness (generalized): Secondary | ICD-10-CM | POA: Diagnosis not present

## 2021-04-22 DIAGNOSIS — M25552 Pain in left hip: Secondary | ICD-10-CM | POA: Diagnosis not present

## 2021-04-22 NOTE — Therapy (Signed)
[facility] Physical Therapy [address] [place], [place], [address] Phone: [phone]   Fax:  [phone]  Physical Therapy Treatment  Patient Details  Name: Franklin Hicks MRN: [id number] Date of Birth: [birth year] Referring Provider (PT): Dr. Clementeen Graham   Encounter Date: 04/22/2021   PT End of Session - 04/22/21 1155     Visit Number 11    Number of Visits 12    Date for PT Re-Evaluation 04/29/21   extended for human[employer] authorization   Authorization Type [employer] - asking for 12 visits through 04/29/2021    Authorization - Visit Number 11    Authorization - Number of Visits 12    Progress Note Due on Visit 20   Recert for [employer] at 12   PT Start Time 1146    PT Stop Time 1224    PT Time Calculation (min) 38 min    Activity Tolerance Patient tolerated treatment well    Behavior During Therapy [facility] for tasks assessed/performed             Past Medical History:  Diagnosis Date   Arthritis    "knees" (12/17/2017)   CAD S/P percutaneous coronary angioplasty 09/20/2016   Nstemi 08/2016. Dr. Algie Coffer. DES- brilinta and asa. Requests change to [facility] cardiology; 95% Ramus -> PCI Resolute Onyx DES 2.75 x 18   Central cord syndrome (HCC) 12/17/2017   Chronic combined systolic and diastolic heart failure (HCC) 10/09/2016   EF 45% and grade II diastolic after nstemi   Diet-controlled diabetes mellitus (HCC)    DJD (degenerative joint disease)    Gout    High cholesterol    Hypertension    NSTEMI (non-ST elevated myocardial infarction) (HCC) 09/20/2016   95% Ramus - > PCI    Obesity    Recurrent pulmonary embolism (HCC) 11/'14; 4/'19   a) Bilateral segmental and subsegmental pulmonary emboli with mild RV Strain.; b) after fall with C-spine Fxr --> Acute PE of right main pulmonary artery extending into multiple segments.    Past Surgical History:  Procedure Laterality Date   CARDIAC CATHETERIZATION N/A 09/19/2016   Procedure: Left Heart Cath and Coronary Angiography;   Surgeon: Orpah Cobb, MD;  [facility];  Service: Cardiovascular: 95% proximal Ramus Intermedius --> PCI   CARDIAC CATHETERIZATION N/A 09/19/2016   Procedure: Coronary Stent Intervention;  Surgeon: Yvonne Kendall, MD;  Location: [facility] LAB;  Service: Cardiovascular: 95% ramus intermedius;  Resolute Onyx 2.75 x 18 mm drug-eluting stent   CYSTOSCOPY/RETROGRADE/URETEROSCOPY/STONE EXTRACTION WITH BASKET  9528,4132   ureteral stone    MENISCUS REPAIR Left    knee open meniscetomy   TRANSTHORACIC ECHOCARDIOGRAM  2004   no lvh nl ejection fraction   TRANSTHORACIC ECHOCARDIOGRAM  09/19/2016   In setting of NSTEMI:  EF 45-50% with diffuse hypokinesis. GR 2 DD. Mild biatrial enlargement.    There were no vitals filed for this visit.   Subjective Assessment - 04/22/21 1155     Subjective Pt. stated continued improvement on hip pain.  Pt. stated he was able to go to church yesterday and that was good to get out.    Pertinent History CHF, CAD, DJD, DM, HTN, High cholesterol, obesity.  Bilateral knee pain in past    Limitations Walking;House hold activities;Standing;Lifting    Diagnostic tests xray    Patient Stated Goals Reduce pain    Currently in Pain? No/denies    Pain Score 0-No pain    Pain Onset More than a month  ago                               [facility] Adult PT Treatment/Exercise - 04/22/21 0001       Therapeutic Activites    Other Therapeutic Activities standing pivot transfer x 2 c UE assist from wheelchair to similar seat height.  Ambulation c FWW c minimal Rt arm WB 50 ft x 4 within clinic SBA      Knee/Hip Exercises: Aerobic   Nustep Lvl 6 15 mins UE/LE      Knee/Hip Exercises: Machines for Strengthening   Cybex Leg Press DL 62 lbs 3 x 15, SL 37 lbs 3 x 15 each      Knee/Hip Exercises: Standing   Other Standing Knee Exercises standing lumbar ext x 10      Knee/Hip Exercises: Seated   Long Arc Quad Both;3 sets;10 reps    Long Arc  Quad Weight 5 lbs.                      PT Short Term Goals - 04/08/21 1258       PT SHORT TERM GOAL #1   Title Patient will demonstrate independent use of home exercise program to maintain progress from in clinic treatments.    Time 3    Period Weeks    Status Achieved    Target Date 04/01/21               PT Long Term Goals - 04/17/21 1221       PT LONG TERM GOAL #1   Title Patient will demonstrate/report pain at worst less than or equal to 2/10 to facilitate minimal limitation in daily activity secondary to pain symptoms.    Time 10    Period Weeks    Status Achieved      PT LONG TERM GOAL #2   Title Patient will demonstrate independent use of home exercise program to facilitate ability to maintain/progress functional gains from skilled physical therapy services.    Time 10    Period Weeks    Status On-going    Target Date 04/29/21      PT LONG TERM GOAL #3   Title Pt. will demonstrate FOTO outcome > or = 68 to indicated reduced disability due to condtion.    Time 10    Period Weeks    Status On-going    Target Date 04/29/21      PT LONG TERM GOAL #4   Title Pt. will demonstrate Lt hip flexion MMT 5/5 without pain to facilitate usual daily activity at PLOF.    Time 10    Period Weeks    Status On-going    Target Date 04/29/21      PT LONG TERM GOAL #5   Title Pt. will demonstrate ability to perform sit to stand to sit from wheelchair, transfers from wheelchair to bed on first attempt c use of Lt UE to facilitate mobility at PLOF.    Time 10    Period Weeks    Status On-going    Target Date 04/29/21      PT LONG TERM GOAL #6   Title Pt. will demonstrate ability to ambulate modified independent with walker > 300 ft to facilitate household and basic community ambulation.    Time 10    Period Weeks    Status On-going    Target Date 04/29/21  Plan - 04/22/21 1215     Clinical Impression Statement Continued  implementation of progressive strengthening for LE as well as improving endurance for cardiovascular gains to facilitate improved ambulation durations.  Limitation for Rt arm WB still present as limitation in ambulation.    Personal Factors and Comorbidities Comorbidity 3+    Comorbidities CHF, CAD, DJD, DM, HTN, High cholesterol, obesity    Examination-Activity Limitations Locomotion Level;Lift;Transfers;Stand;Carry;Stairs;Squat;Bend;Bed Mobility    Examination-Participation Restrictions Community Activity    Stability/Clinical Decision Making Evolving/Moderate complexity    Rehab Potential Fair    PT Frequency 2x / week    PT Duration Other (comment)   10 weeks (extended for [facility] certification)   PT Treatment/Interventions ADLs/Self Care Home Management;Cryotherapy;Electrical Stimulation;Therapeutic activities;Iontophoresis 4mg /ml Dexamethasone;Moist Heat;Traction;Balance training;Therapeutic exercise;Functional mobility training;Stair training;Gait training;DME Instruction;Ultrasound;Neuromuscular re-education;Patient/family education;Passive range of motion;Spinal Manipulations;Joint Manipulations;Dry needling;Taping;Manual techniques    PT Next Visit Plan Last approved visit next time.  Transitioning to HEP trial.    PT Home Exercise Plan seated marching, seated SLR, seated hip abduction    Consulted and Agree with Plan of Care Patient             Patient will benefit from skilled therapeutic intervention in order to improve the following deficits and impairments:  Abnormal gait, Decreased endurance, Hypomobility, Obesity, Pain, Increased fascial restricitons, Decreased strength, Decreased activity tolerance, Decreased balance, Decreased mobility, Difficulty walking, Impaired perceived functional ability, Improper body mechanics, Impaired flexibility, Postural dysfunction, Decreased coordination, Decreased range of motion  Visit Diagnosis: Pain in left hip  Pain in left leg  Muscle  weakness (generalized)  Difficulty in walking, not elsewhere classified     Problem List Patient Active Problem List   Diagnosis Date Noted   Preoperative cardiovascular examination 02/05/2021   Recurrent pulmonary embolism (HCC)    Pain of left heel    C4 spinal cord injury, sequela (HCC) 12/18/2017   Tetraplegia (HCC) 12/18/2017   Aortic aneurysm (HCC) 12/16/2017   Cord compression (HCC) 12/13/2017   Chronic combined systolic and diastolic heart failure (HCC) 10/09/2016   CAD S/P percutaneous coronary angioplasty 09/20/2016   Primary osteoarthritis of knees, bilateral 01/11/2016   Erectile dysfunction 07/30/2015   Morbid obesity (HCC) 07/18/2013   Angioedema of lips 07/13/2013   Multinodular goiter 07/13/2013   Hyperlipidemia associated with type 2 diabetes mellitus (HCC) 10/02/2011   SOB (shortness of breath) 10/02/2011   BPH with urinary obstruction 07/17/2010   Diabetes mellitus type 2, controlled (HCC) 07/17/2010   Bilateral lower extremity edema 12/08/2008   NEUROPATHY, IDIOPATHIC PERIPHERAL NEC 05/31/2007   Osteoarthritis 03/22/2007   Gout 03/15/2007   Hypertension associated with diabetes (HCC) 03/15/2007   03/17/2007, PT, DPT, OCS, ATC 04/22/21  12:25 PM    [facility] Physical Therapy [address] [place], [place], [place] Phone: [phone]   Fax:  [phone]  Name: Franklin Hicks MRN: Berna Spare Date of Birth: 11-04-1950

## 2021-04-23 ENCOUNTER — Ambulatory Visit: Payer: Medicare PPO | Admitting: Family Medicine

## 2021-04-23 ENCOUNTER — Encounter: Payer: Self-pay | Admitting: Family Medicine

## 2021-04-23 ENCOUNTER — Ambulatory Visit: Payer: Self-pay

## 2021-04-23 ENCOUNTER — Ambulatory Visit (INDEPENDENT_AMBULATORY_CARE_PROVIDER_SITE_OTHER): Payer: Medicare PPO

## 2021-04-23 VITALS — BP 122/66 | HR 78 | Ht 72.0 in | Wt 390.0 lb

## 2021-04-23 DIAGNOSIS — S62101B Fracture of unspecified carpal bone, right wrist, initial encounter for open fracture: Secondary | ICD-10-CM

## 2021-04-23 DIAGNOSIS — M25531 Pain in right wrist: Secondary | ICD-10-CM

## 2021-04-23 DIAGNOSIS — S52501A Unspecified fracture of the lower end of right radius, initial encounter for closed fracture: Secondary | ICD-10-CM | POA: Diagnosis not present

## 2021-04-23 NOTE — Patient Instructions (Signed)
Thank you for coming in today.   Please get an Xray today before you leave   Unless we get suppressed the plan will be to maximize hand OT/PT and home exercises.   Ask the OT about assistive devices for home hygiene etc.   At the end of this if we need to get  you a second opinion with a hand surgeon we can.
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
## 2021-04-24 ENCOUNTER — Encounter: Payer: Self-pay | Admitting: Rehabilitative and Restorative Service Providers"

## 2021-04-24 ENCOUNTER — Ambulatory Visit: Payer: Medicare PPO | Admitting: Rehabilitative and Restorative Service Providers"

## 2021-04-24 ENCOUNTER — Other Ambulatory Visit: Payer: Self-pay

## 2021-04-24 DIAGNOSIS — M6281 Muscle weakness (generalized): Secondary | ICD-10-CM

## 2021-04-24 DIAGNOSIS — M79605 Pain in left leg: Secondary | ICD-10-CM | POA: Diagnosis not present

## 2021-04-24 DIAGNOSIS — R262 Difficulty in walking, not elsewhere classified: Secondary | ICD-10-CM

## 2021-04-24 DIAGNOSIS — M25552 Pain in left hip: Secondary | ICD-10-CM | POA: Diagnosis not present

## 2021-04-24 NOTE — Therapy (Addendum)
[facility] Physical Therapy [address] [place], [place], [address] Phone: [phone]   Fax:  [phone]  Physical Therapy Treatment /Discharge  Patient Details  Name: Franklin Hicks MRN: [id number] Date of Birth: 12/16/1950 Referring Provider (PT): Dr. Lynne Leader   Encounter Date: 04/24/2021   PT End of Session - 04/24/21 1156     Visit Number 12    Number of Visits 12    Date for PT Re-Evaluation 04/29/21   extended for humana authorization   Authorization Type HUMANA - asking for 12 visits through 04/29/2021    Authorization - Visit Number 12    Authorization - Number of Visits 12    Progress Note Due on Visit --   Recert for HUMANA at 28   PT Start Time 1148    PT Stop Time 1220    PT Time Calculation (min) 32 min    Activity Tolerance Patient tolerated treatment well    Behavior During Therapy [facility] for tasks assessed/performed             Past Medical History:  Diagnosis Date   Arthritis    "knees" (12/17/2017)   CAD S/P percutaneous coronary angioplasty 09/20/2016   Nstemi 08/2016. Dr. Doylene Canard. DES- brilinta and asa. Requests change to [facility] cardiology; 95% Ramus -> PCI Resolute Onyx DES 2.75 x 18   Central cord syndrome ([place]) 12/17/2017   Chronic combined systolic and diastolic heart failure (HCC) 10/09/2016   EF 45% and grade II diastolic after nstemi   Diet-controlled diabetes mellitus (HCC)    DJD (degenerative joint disease)    Gout    High cholesterol    Hypertension    NSTEMI (non-ST elevated myocardial infarction) ([place]) 09/20/2016   95% Ramus - > PCI    Obesity    Recurrent pulmonary embolism ([place]) 11/'14; 4/'19   a) Bilateral segmental and subsegmental pulmonary emboli with mild RV Strain.; b) after fall with C-spine Fxr --> Acute PE of right main pulmonary artery extending into multiple segments.    Past Surgical History:  Procedure Laterality Date   CARDIAC CATHETERIZATION N/A 09/19/2016   Procedure: Left Heart Cath and Coronary  Angiography;  Surgeon: Dixie Dials, MD;  Location: [place] CV [facility];  Service: Cardiovascular: 95% proximal Ramus Intermedius --> PCI   CARDIAC CATHETERIZATION N/A 09/19/2016   Procedure: Coronary Stent Intervention;  Surgeon: Nelva Bush, MD;  Location: [place] CV [facility];  Service: Cardiovascular: 95% ramus intermedius;  Resolute Onyx 2.75 x 18 mm drug-eluting stent   CYSTOSCOPY/RETROGRADE/URETEROSCOPY/STONE EXTRACTION WITH BASKET  9767,3419   ureteral stone    MENISCUS REPAIR Left    knee open meniscetomy   TRANSTHORACIC ECHOCARDIOGRAM  2004   no lvh nl ejection fraction   TRANSTHORACIC ECHOCARDIOGRAM  09/19/2016   In setting of NSTEMI:  EF 45-50% with diffuse hypokinesis. GR 2 DD. Mild biatrial enlargement.    There were no vitals filed for this visit.   Subjective Assessment - 04/24/21 1156     Subjective No pain complaints indicated today upon arrival.  Pt. reported agreement with plan to try HEP at this time.    Pertinent History CHF, CAD, DJD, DM, HTN, High cholesterol, obesity.  Bilateral knee pain in past    Limitations Walking;House hold activities;Standing;Lifting    Diagnostic tests xray    Patient Stated Goals Reduce pain    Currently in Pain? No/denies    Pain Score 0-No pain    Pain Onset More than a month ago  [place] Adult PT Treatment/Exercise - 04/24/21 0001       Knee/Hip Exercises: Aerobic   Nustep Lvl 6 15 mins UE/LE      Knee/Hip Exercises: Machines for Strengthening   Cybex Leg Press DL 62 lbs 3 x 15, SL 37 lbs 3 x 15 each                      PT Short Term Goals - 04/08/21 1258       PT SHORT TERM GOAL #1   Title Patient will demonstrate independent use of home exercise program to maintain progress from in clinic treatments.    Time 3    Period Weeks    Status Achieved    Target Date 04/01/21               PT Long Term Goals - 04/24/21 1222       PT LONG TERM GOAL #1    Title Patient will demonstrate/report pain at worst less than or equal to 2/10 to facilitate minimal limitation in daily activity secondary to pain symptoms.    Time 10    Period Weeks    Status Achieved      PT LONG TERM GOAL #2   Title Patient will demonstrate independent use of home exercise program to facilitate ability to maintain/progress functional gains from skilled physical therapy services.    Time 10    Period Weeks    Status Achieved      PT LONG TERM GOAL #3   Title Pt. will demonstrate FOTO outcome > or = 68 to indicated reduced disability due to condtion.    Time 10    Period Weeks    Status Partially Met      PT LONG TERM GOAL #4   Title Pt. will demonstrate Lt hip flexion MMT 5/5 without pain to facilitate usual daily activity at PLOF.    Time 10    Period Weeks    Status Partially Met      PT LONG TERM GOAL #5   Title Pt. will demonstrate ability to perform sit to stand to sit from wheelchair, transfers from wheelchair to bed on first attempt c use of Lt UE to facilitate mobility at PLOF.    Time 10    Period Weeks    Status Not Met      PT LONG TERM GOAL #6   Title Pt. will demonstrate ability to ambulate modified independent with walker > 300 ft to facilitate household and basic community ambulation.    Time 10    Period Weeks    Status Partially Met                   Plan - 04/24/21 1157     Clinical Impression Statement Pt. has attended 12 visits overall (all approved [place] visits).  Pt. has demonstrated gains in ability to walk, standing and perform transfers.  Difficulty still noted, in part due to previous medical history of knee complications as well as Rt UE WB limitations related to Rt wrist.  These concurrent presentations of impairment negatively impacts ambulation and mobility.  Pt. does show posititive gains in hip pain and LE strength that would indicate positive gains possible once Rt UE restrictions are removed completely.  Trial  to HEP at this time appropriate.    Personal Factors and Comorbidities Comorbidity 3+    Comorbidities CHF, CAD, DJD, DM, HTN, High cholesterol, obesity  Examination-Activity Limitations Locomotion Level;Lift;Transfers;Stand;Carry;Stairs;Squat;Bend;Bed Mobility    Examination-Participation Restrictions Community Activity    Stability/Clinical Decision Making Evolving/Moderate complexity    Rehab Potential Fair    PT Frequency 2x / week    PT Duration Other (comment)   10 weeks (extended for [facility] certification)   PT Treatment/Interventions ADLs/Self Care Home Management;Cryotherapy;Electrical Stimulation;Therapeutic activities;Iontophoresis 12m/ml Dexamethasone;Moist Heat;Traction;Balance training;Therapeutic exercise;Functional mobility training;Stair training;Gait training;DME Instruction;Ultrasound;Neuromuscular re-education;Patient/family education;Passive range of motion;Spinal Manipulations;Joint Manipulations;Dry needling;Taping;Manual techniques    PT Next Visit Plan HEP trial.    PT Home Exercise Plan seated marching, seated SLR, seated hip abduction    Consulted and Agree with Plan of Care Patient             Patient will benefit from skilled therapeutic intervention in order to improve the following deficits and impairments:  Abnormal gait, Decreased endurance, Hypomobility, Obesity, Pain, Increased fascial restricitons, Decreased strength, Decreased activity tolerance, Decreased balance, Decreased mobility, Difficulty walking, Impaired perceived functional ability, Improper body mechanics, Impaired flexibility, Postural dysfunction, Decreased coordination, Decreased range of motion  Visit Diagnosis: Pain in left hip  Pain in left leg  Muscle weakness (generalized)  Difficulty in walking, not elsewhere classified     Problem List Patient Active Problem List   Diagnosis Date Noted   Preoperative cardiovascular examination 02/05/2021   Recurrent pulmonary embolism  (HCC)    Pain of left heel    C4 spinal cord injury, sequela ([place] 12/18/2017   Tetraplegia ([place] 12/18/2017   Aortic aneurysm ([place] 12/16/2017   [place]ord compression ([place] 12/13/2017   Chronic combined systolic and diastolic heart failure ([place] 10/09/2016   CAD S/P percutaneous coronary angioplasty 09/20/2016   Primary osteoarthritis of knees, bilateral 01/11/2016   Erectile dysfunction 07/30/2015   Morbid obesity ([place] 07/18/2013   Angioedema of lips 07/13/2013   Multinodular goiter 07/13/2013   Hyperlipidemia associated with type 2 diabetes mellitus ([place] 10/02/2011   SOB (shortness of breath) 10/02/2011   BPH with urinary obstruction 07/17/2010   Diabetes mellitus type 2, controlled ([place] 07/17/2010   Bilateral lower extremity edema 12/08/2008   NEUROPATHY, IDIOPATHIC PERIPHERAL NEC 05/31/2007   Osteoarthritis 03/22/2007   Gout 03/15/2007   Hypertension associated with diabetes ([place] 03/15/2007    MScot Jun PT, DPT, OCS, ATC 04/24/21  12:24 PM  PHYSICAL THERAPY DISCHARGE SUMMARY  Visits from Start of Care: 12  Current functional level related to goals / functional outcomes: See note   Remaining deficits: See note   Education / Equipment: HEP   Patient agrees to discharge. Patient goals were partially met. Patient is being discharged due to maximized rehab potential.  (Ambulation limited in part by concurrent Rt wrist injury).  MScot Jun PT, DPT, OCS, ATC 05/13/21  1:17 PM     [facility] [address] [address]Phone: [phone]  Fax:  [phone] Name: Franklin JAGGERSMRN: [id number] of Birth: [birth year]

## 2021-04-25 ENCOUNTER — Ambulatory Visit (INDEPENDENT_AMBULATORY_CARE_PROVIDER_SITE_OTHER): Payer: Medicare PPO | Admitting: Plastic Surgery

## 2021-04-25 ENCOUNTER — Encounter: Payer: Self-pay | Admitting: Plastic Surgery

## 2021-04-25 DIAGNOSIS — S52501A Unspecified fracture of the lower end of right radius, initial encounter for closed fracture: Secondary | ICD-10-CM | POA: Diagnosis not present

## 2021-04-25 NOTE — Progress Notes (Signed)
   Referring Provider Shelva Majestic, MD [address] [place],  [place] [address]   CC:  Chief Complaint  Patient presents with   Follow-up      Franklin Hicks is an 70 y.o. male.  HPI: Patient presents about 3 months out from a right distal radius fracture.  We discussed operative and nonoperative strategies initially and ultimately he is elected nonoperative treatment.  He feels like his recovery is going quite well.  He rarely has any pain.  His function in terms of his fingers and wrist motion is still continuing to improve.  At this moment he feels like he can do all of his functional tasks that he needs to do and wants to do.  Review of Systems General: Denies fevers and chills  Physical Exam Vitals with BMI 04/23/2021 03/12/2021 02/12/2021  Height 6\' 0"  6\' 0"  6\' 0"   Weight 390 lbs - -  BMI 52.88 - -  Systolic 122 130  Diastolic 66 60 90  Pulse 78 85 71    General:  No acute distress,  Alert and oriented, Non-Toxic, Normal speech and affect Right hand: Fingers well-perfused normal cap refill palp radial pulse.  Sensation is intact throughout.  He has about 30 degrees of wrist flexion and about 15 to 20 degrees of wrist extension.  Pronation and supination seem to be reasonably intact with no pain with motion.  He did get an x-ray a couple days ago which reveals maintained position of all the bony fragments.  There looks to be callus formation which would be consistent with his clinical exam.  Assessment/Plan Patient presents 3 months out from close distal radius fracture.  This was heavily comminuted and fragmented and we did choose to do nonoperative treatment for various reasons including his medical comorbidities and surgical risk.  Ultimately his function looks to be good and he is having no pain at this point.  We did discuss that he may be predisposed to arthritis down the line given the joint malalignment and if he does develop symptoms down the line I am happy to  see him and discuss it.  I did offer to see him again in 6 months he preferred to follow-up as needed.  All of his questions were answered.  04/25/2021, 2:15 PM

## 2021-04-26 NOTE — Progress Notes (Signed)
The fracture is still present in the right wrist it does not look like it has healed fully on x-ray.  To tell for sure this would require a CT scan which I would be happy to arrange.  The fracture in fact looks a little worse than it did back in May to the radiologist.  If you would like happy to get further testing and a second opinion from a hand surgeon however I do think a hand surgeon at this point would probably recommend surgery if that something you are willing to consider.

## 2021-04-30 ENCOUNTER — Encounter: Payer: Medicare PPO | Admitting: Rehabilitative and Restorative Service Providers"

## 2021-05-02 ENCOUNTER — Ambulatory Visit: Payer: Medicare PPO | Attending: Plastic Surgery | Admitting: Occupational Therapy

## 2021-05-02 ENCOUNTER — Other Ambulatory Visit: Payer: Self-pay

## 2021-05-02 DIAGNOSIS — M25531 Pain in right wrist: Secondary | ICD-10-CM

## 2021-05-02 DIAGNOSIS — R278 Other lack of coordination: Secondary | ICD-10-CM | POA: Diagnosis not present

## 2021-05-02 DIAGNOSIS — M6281 Muscle weakness (generalized): Secondary | ICD-10-CM | POA: Diagnosis not present

## 2021-05-02 DIAGNOSIS — M25552 Pain in left hip: Secondary | ICD-10-CM | POA: Insufficient documentation

## 2021-05-02 DIAGNOSIS — M25631 Stiffness of right wrist, not elsewhere classified: Secondary | ICD-10-CM | POA: Diagnosis not present

## 2021-05-02 NOTE — Patient Instructions (Addendum)
    ROM: Abduction - Wand   Sitting only Holding wand with left hand palm up, push wand directly out to side, leading with other hand palm down, until stretch is felt. Hold 5 seconds. Repeat 10 times per set. Do 2-3 sessions per day.          Cane Overhead - sitting- raise to eye level only   With arms straight, hold cane forward at waist. Raise cane above to eye level. Hold 3 seconds. Repeat 10 times. Do 2 times per day.      Wrist Extension    Forearm resting on thigh (or other surface), palm down, weight in hand. Raise hand up. Hold momentarily. Return slowly. Repeat _10__ times with right hand Weight __1_ lbs. Or water bottle  Copyright  VHI. All rights reserved.
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
## 2021-05-06 ENCOUNTER — Encounter: Payer: Medicare PPO | Admitting: Rehabilitative and Restorative Service Providers"

## 2021-05-08 ENCOUNTER — Encounter: Payer: Medicare PPO | Admitting: Rehabilitative and Restorative Service Providers"

## 2021-05-09 ENCOUNTER — Ambulatory Visit: Payer: Medicare PPO | Admitting: Occupational Therapy

## 2021-05-09 ENCOUNTER — Other Ambulatory Visit: Payer: Self-pay

## 2021-05-09 DIAGNOSIS — M6281 Muscle weakness (generalized): Secondary | ICD-10-CM

## 2021-05-09 DIAGNOSIS — R278 Other lack of coordination: Secondary | ICD-10-CM | POA: Diagnosis not present

## 2021-05-09 DIAGNOSIS — M25552 Pain in left hip: Secondary | ICD-10-CM | POA: Diagnosis not present

## 2021-05-09 DIAGNOSIS — M25531 Pain in right wrist: Secondary | ICD-10-CM | POA: Diagnosis not present

## 2021-05-09 DIAGNOSIS — M25631 Stiffness of right wrist, not elsewhere classified: Secondary | ICD-10-CM | POA: Diagnosis not present
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
## 2021-05-09 NOTE — Patient Instructions (Signed)
Strengthening Pronation / Supination    Keep right forearm on table, hand over edge, palm-up. Holding a __1__ lb weight,or hammer turn palm down. Hold ___5_ seconds. Turn palm up. Hold __5__ seconds. Repeat _10___ times. Do __1__ sessions per day.  http://gt2.exer.us/170   Copyright  VHI. All rights reserved.   Stand at the sink, get weight through both hands gently rock forwards and backwards with w/c behind, start at 10 reps 1x day progress to 2x day for wrist stretch and standing tolerance.

## 2021-05-11 ENCOUNTER — Other Ambulatory Visit: Payer: Self-pay | Admitting: Family Medicine

## 2021-05-14 ENCOUNTER — Ambulatory Visit: Payer: Medicare PPO | Admitting: Occupational Therapy

## 2021-05-14 ENCOUNTER — Other Ambulatory Visit: Payer: Self-pay

## 2021-05-14 DIAGNOSIS — M6281 Muscle weakness (generalized): Secondary | ICD-10-CM

## 2021-05-14 DIAGNOSIS — M25631 Stiffness of right wrist, not elsewhere classified: Secondary | ICD-10-CM

## 2021-05-14 DIAGNOSIS — R278 Other lack of coordination: Secondary | ICD-10-CM | POA: Diagnosis not present

## 2021-05-14 DIAGNOSIS — M25531 Pain in right wrist: Secondary | ICD-10-CM

## 2021-05-14 DIAGNOSIS — M25552 Pain in left hip: Secondary | ICD-10-CM | POA: Diagnosis not present

## 2021-05-14 NOTE — Addendum Note (Signed)
Addended by: Colonel Bald on: 05/14/2021 04:21 PM   Modules accepted: Orders
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
## 2021-05-21 ENCOUNTER — Other Ambulatory Visit: Payer: Self-pay

## 2021-05-21 ENCOUNTER — Encounter: Payer: Self-pay | Admitting: Occupational Therapy

## 2021-05-21 ENCOUNTER — Ambulatory Visit: Payer: Medicare PPO | Admitting: Occupational Therapy

## 2021-05-21 DIAGNOSIS — M25531 Pain in right wrist: Secondary | ICD-10-CM

## 2021-05-21 DIAGNOSIS — M6281 Muscle weakness (generalized): Secondary | ICD-10-CM

## 2021-05-21 DIAGNOSIS — M25631 Stiffness of right wrist, not elsewhere classified: Secondary | ICD-10-CM

## 2021-05-21 DIAGNOSIS — R278 Other lack of coordination: Secondary | ICD-10-CM

## 2021-05-21 DIAGNOSIS — M25552 Pain in left hip: Secondary | ICD-10-CM | POA: Diagnosis not present
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
## 2021-05-28 ENCOUNTER — Ambulatory Visit: Payer: Medicare PPO | Attending: Plastic Surgery | Admitting: Occupational Therapy

## 2021-05-28 ENCOUNTER — Other Ambulatory Visit: Payer: Self-pay

## 2021-05-28 DIAGNOSIS — M6281 Muscle weakness (generalized): Secondary | ICD-10-CM | POA: Diagnosis not present

## 2021-05-28 DIAGNOSIS — M25631 Stiffness of right wrist, not elsewhere classified: Secondary | ICD-10-CM | POA: Diagnosis not present
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
## 2021-05-31 ENCOUNTER — Ambulatory Visit: Payer: Medicare PPO

## 2021-06-20 ENCOUNTER — Ambulatory Visit (INDEPENDENT_AMBULATORY_CARE_PROVIDER_SITE_OTHER): Payer: Medicare PPO

## 2021-06-20 DIAGNOSIS — Z0000 Encounter for general adult medical examination without abnormal findings: Secondary | ICD-10-CM

## 2021-06-20 NOTE — Patient Instructions (Signed)
Franklin Hicks , Thank you for taking time to come for your Medicare Wellness Visit. I appreciate your ongoing commitment to your health goals. Please review the following plan we discussed and let me know if I can assist you in the future.   Screening recommendations/referrals: Colonoscopy: Postponed until 2025 Recommended yearly ophthalmology/optometry visit for glaucoma screening and checkup Recommended yearly dental visit for hygiene and checkup  Vaccinations: Influenza vaccine: Due and discussed Pneumococcal vaccine: Due and discussed  Tdap vaccine: due and discussed  Shingles vaccine: Shingrix discussed. Please contact your pharmacy for coverage information.    Covid-19: Completed 5/20, 02/03/20 & 2/3, & 02/28/21  Advanced directives: Advance directive discussed with you today. I have provided a copy for you to complete at home and have notarized. Once this is complete please bring a copy in to our office so we can scan it into your chart.  Conditions/risks identified: lose weight   Next appointment: Follow up in one year for your annual wellness visit.   Preventive Care 28 Years and Older, Male Preventive care refers to lifestyle choices and visits with your health care provider that can promote health and wellness. What does preventive care include? A yearly physical exam. This is also called an annual well check. Dental exams once or twice a year. Routine eye exams. Ask your health care provider how often you should have your eyes checked. Personal lifestyle choices, including: Daily care of your teeth and gums. Regular physical activity. Eating a healthy diet. Avoiding tobacco and drug use. Limiting alcohol use. Practicing safe sex. Taking low doses of aspirin every day. Taking vitamin and mineral supplements as recommended by your health care provider. What happens during an annual well check? The services and screenings done by your health care provider during your annual well  check will depend on your age, overall health, lifestyle risk factors, and family history of disease. Counseling  Your health care provider may ask you questions about your: Alcohol use. Tobacco use. Drug use. Emotional well-being. Home and relationship well-being. Sexual activity. Eating habits. History of falls. Memory and ability to understand (cognition). Work and work Astronomer. Screening  You may have the following tests or measurements: Height, weight, and BMI. Blood pressure. Lipid and cholesterol levels. These may be checked every 5 years, or more frequently if you are over 65 years old. Skin check. Lung cancer screening. You may have this screening every year starting at age 1 if you have a 30-pack-year history of smoking and currently smoke or have quit within the past 15 years. Fecal occult blood test (FOBT) of the stool. You may have this test every year starting at age 33. Flexible sigmoidoscopy or colonoscopy. You may have a sigmoidoscopy every 5 years or a colonoscopy every 10 years starting at age 73. Prostate cancer screening. Recommendations will vary depending on your family history and other risks. Hepatitis C blood test. Hepatitis B blood test. Sexually transmitted disease (STD) testing. Diabetes screening. This is done by checking your blood sugar (glucose) after you have not eaten for a while (fasting). You may have this done every 1-3 years. Abdominal aortic aneurysm (AAA) screening. You may need this if you are a current or former smoker. Osteoporosis. You may be screened starting at age 90 if you are at high risk. Talk with your health care provider about your test results, treatment options, and if necessary, the need for more tests. Vaccines  Your health care provider may recommend certain vaccines, such as: Influenza vaccine.  This is recommended every year. Tetanus, diphtheria, and acellular pertussis (Tdap, Td) vaccine. You may need a Td booster  every 10 years. Zoster vaccine. You may need this after age 37. Pneumococcal 13-valent conjugate (PCV13) vaccine. One dose is recommended after age 55. Pneumococcal polysaccharide (PPSV23) vaccine. One dose is recommended after age 46. Talk to your health care provider about which screenings and vaccines you need and how often you need them. This information is not intended to replace advice given to you by your health care provider. Make sure you discuss any questions you have with your health care provider. Document Released: 09/07/2015 Document Revised: 04/30/2016 Document Reviewed: 06/12/2015 Elsevier Interactive Patient Education  2017 [employer] Prevention in the Home Falls can cause injuries. They can happen to people of all ages. There are many things you can do to make your home safe and to help prevent falls. What can I do on the outside of my home? Regularly fix the edges of walkways and driveways and fix any cracks. Remove anything that might make you trip as you walk through a door, such as a raised step or threshold. Trim any bushes or trees on the path to your home. Use bright outdoor lighting. Clear any walking paths of anything that might make someone trip, such as rocks or tools. Regularly check to see if handrails are loose or broken. Make sure that both sides of any steps have handrails. Any raised decks and porches should have guardrails on the edges. Have any leaves, snow, or ice cleared regularly. Use sand or salt on walking paths during winter. Clean up any spills in your garage right away. This includes oil or grease spills. What can I do in the bathroom? Use night lights. Install grab bars by the toilet and in the tub and shower. Do not use towel bars as grab bars. Use non-skid mats or decals in the tub or shower. If you need to sit down in the shower, use a plastic, non-slip stool. Keep the floor dry. Clean up any water that spills on the floor as soon  as it happens. Remove soap buildup in the tub or shower regularly. Attach bath mats securely with double-sided non-slip rug tape. Do not have throw rugs and other things on the floor that can make you trip. What can I do in the bedroom? Use night lights. Make sure that you have a light by your bed that is easy to reach. Do not use any sheets or blankets that are too big for your bed. They should not hang down onto the floor. Have a firm chair that has side arms. You can use this for support while you get dressed. Do not have throw rugs and other things on the floor that can make you trip. What can I do in the kitchen? Clean up any spills right away. Avoid walking on wet floors. Keep items that you use a lot in easy-to-reach places. If you need to reach something above you, use a strong step stool that has a grab bar. Keep electrical cords out of the way. Do not use floor polish or wax that makes floors slippery. If you must use wax, use non-skid floor wax. Do not have throw rugs and other things on the floor that can make you trip. What can I do with my stairs? Do not leave any items on the stairs. Make sure that there are handrails on both sides of the stairs and use them. Fix handrails  that are broken or loose. Make sure that handrails are as long as the stairways. Check any carpeting to make sure that it is firmly attached to the stairs. Fix any carpet that is loose or worn. Avoid having throw rugs at the top or bottom of the stairs. If you do have throw rugs, attach them to the floor with carpet tape. Make sure that you have a light switch at the top of the stairs and the bottom of the stairs. If you do not have them, ask someone to add them for you. What else can I do to help prevent falls? Wear shoes that: Do not have high heels. Have rubber bottoms. Are comfortable and fit you well. Are closed at the toe. Do not wear sandals. If you use a stepladder: Make sure that it is fully  opened. Do not climb a closed stepladder. Make sure that both sides of the stepladder are locked into place. Ask someone to hold it for you, if possible. Clearly mark and make sure that you can see: Any grab bars or handrails. First and last steps. Where the edge of each step is. Use tools that help you move around (mobility aids) if they are needed. These include: Canes. Walkers. Scooters. Crutches. Turn on the lights when you go into a dark area. Replace any light bulbs as soon as they burn out. Set up your furniture so you have a clear path. Avoid moving your furniture around. If any of your floors are uneven, fix them. If there are any pets around you, be aware of where they are. Review your medicines with your doctor. Some medicines can make you feel dizzy. This can increase your chance of falling. Ask your doctor what other things that you can do to help prevent falls. This information is not intended to replace advice given to you by your health care provider. Make sure you discuss any questions you have with your health care provider. Document Released: 06/07/2009 Document Revised: 01/17/2016 Document Reviewed: 09/15/2014 Elsevier Interactive Patient Education  2017 [employer].

## 2021-06-20 NOTE — Progress Notes (Addendum)
Virtual Visit via Telephone Note  I connected with  Franklin Hicks on 06/20/21 at 10:15 AM EDT by telephone and verified that I am speaking with the correct person using two identifiers.  Medicare Annual Wellness visit completed telephonically due to Covid-19 pandemic.   Persons participating in this call: This Health Coach and this patient.   Location: Patient: Home Provider: Office   I discussed the limitations, risks, security and privacy concerns of performing an evaluation and management service by telephone and the availability of in person appointments. The patient expressed understanding and agreed to proceed.  Unable to perform video visit due to video visit attempted and failed and/or patient does not have video capability.   Some vital signs may be absent or patient reported.   Franklin Schlein, LPN   Subjective:   Franklin Hicks is a 70 y.o. male who presents for Medicare Annual/Subsequent preventive examination.  Review of Systems     Cardiac Risk Factors include: advanced age (>41men, >75 women);obesity (BMI >30kg/m2);hypertension;dyslipidemia;diabetes mellitus;male gender     Objective:    There were no vitals filed for this visit. There is no height or weight on file to calculate BMI.  Advanced Directives 06/20/2021 02/18/2021 01/30/2021 12/22/2019 07/01/2018 06/24/2018 06/07/2018  Does Patient Have a Medical Advance Directive? Yes No No No No No No  Does patient want to make changes to medical advance directive? Yes (MAU/Ambulatory/Procedural Areas - Information given) - - - - - -  Would patient like information on creating a medical advance directive? - No - Patient declined No - Patient declined Yes (MAU/Ambulatory/Procedural Areas - Information given) - No - Patient declined No - Patient declined  Pre-existing out of facility DNR order (yellow form or pink MOST form) - - - - - - -    Current Medications (verified) Outpatient Encounter Medications as of 06/20/2021   Medication Sig   acetaminophen (TYLENOL) 650 MG CR tablet Take 650-1,300 mg by mouth every 8 (eight) hours as needed for pain.   ammonium lactate (AMLACTIN) 12 % lotion Apply 1 application topically as needed for dry skin.   aspirin EC 81 MG tablet Take 1 tablet (81 mg total) by mouth daily. (Patient taking differently: Take 81 mg by mouth in the morning.)   atorvastatin (LIPITOR) 80 MG tablet TAKE 1 TABLET (80 MG TOTAL) BY MOUTH DAILY AT 6 PM.   Chromium Picolinate (CHROMIUM PICOLATE PO) Take 1 tablet by mouth 3 (three) times a week.   diclofenac sodium (VOLTAREN) 1 % GEL Apply 1 application topically 3 (three) times daily. Both knees (Patient taking differently: Apply 1 application topically 3 (three) times daily as needed (knee pain).)   metoprolol tartrate (LOPRESSOR) 50 MG tablet Take 1 tablet (50 mg total) by mouth 2 (two) times daily.   Multiple Vitamin (MULTIVITAMIN WITH MINERALS) TABS tablet Take 1 tablet by mouth daily.   Potassium Citrate 15 MEQ (1620 MG) TBCR Take 1 tablet by mouth 2 (two) times daily.   terazosin (HYTRIN) 10 MG capsule TAKE 1 CAPSULE (10 MG TOTAL) BY MOUTH AT BEDTIME.   vitamin C (ASCORBIC ACID) 500 MG tablet Take 500 mg by mouth daily in the afternoon.   XARELTO 20 MG TABS tablet TAKE 1 TABLET BY MOUTH EVERY DAY WITH SUPPER   COVID-19 mRNA Vac-TriS, Pfizer, (PFIZER-BIONT COVID-19 VAC-TRIS) SUSP injection Inject into the muscle.   No facility-administered encounter medications on file as of 06/20/2021.    Allergies (verified) Ace inhibitors and Other   History:  Past Medical History:  Diagnosis Date   Arthritis    "knees" (12/17/2017)   CAD S/P percutaneous coronary angioplasty 09/20/2016   Nstemi 08/2016. Dr. Algie Coffer. DES- brilinta and asa. Requests change to [facility] cardiology; 95% Ramus -> PCI Resolute Onyx DES 2.75 x 18   Central cord syndrome (HCC) 12/17/2017   Chronic combined systolic and diastolic heart failure (HCC) 10/09/2016   EF 45% and grade II  diastolic after nstemi   Diet-controlled diabetes mellitus (HCC)    DJD (degenerative joint disease)    Gout    High cholesterol    Hypertension    NSTEMI (non-ST elevated myocardial infarction) (HCC) 09/20/2016   95% Ramus - > PCI    Obesity    Recurrent pulmonary embolism (HCC) 11/'14; 4/'19   a) Bilateral segmental and subsegmental pulmonary emboli with mild RV Strain.; b) after fall with C-spine Fxr --> Acute PE of right main pulmonary artery extending into multiple segments.   Past Surgical History:  Procedure Laterality Date   CARDIAC CATHETERIZATION N/A 09/19/2016   Procedure: Left Heart Cath and Coronary Angiography;  Surgeon: Orpah Cobb, MD;  Location: [facility] INVASIVE CV LAB;  Service: Cardiovascular: 95% proximal Ramus Intermedius --> PCI   CARDIAC CATHETERIZATION N/A 09/19/2016   Procedure: Coronary Stent Intervention;  Surgeon: Yvonne Kendall, MD;  Location: [facility] INVASIVE CV LAB;  Service: Cardiovascular: 95% ramus intermedius;  Resolute Onyx 2.75 x 18 mm drug-eluting stent   CYSTOSCOPY/RETROGRADE/URETEROSCOPY/STONE EXTRACTION WITH BASKET  6387,5643   ureteral stone    MENISCUS REPAIR Left    knee open meniscetomy   TRANSTHORACIC ECHOCARDIOGRAM  2004   no lvh nl ejection fraction   TRANSTHORACIC ECHOCARDIOGRAM  09/19/2016   In setting of NSTEMI:  EF 45-50% with diffuse hypokinesis. GR 2 DD. Mild biatrial enlargement.   Family History  Problem Relation Age of Onset   Liver disease Mother    Social History   Socioeconomic History   Marital status: Married    Spouse name: Not on file   Number of children: Not on file   Years of education: Not on file   Highest education level: Not on file  Occupational History   Occupation: teacher  Tobacco Use   Smoking status: Former    Packs/day: 1.00    Years: 13.00    Pack years: 13.00    Types: Cigarettes    Quit date: 07/13/1977    Years since quitting: 43.9   Smokeless tobacco: Never  Vaping Use   Vaping Use: Never used   Substance and Sexual Activity   Alcohol use: Yes    Alcohol/week: 0.0 standard drinks    Comment: holidays only   Drug use: No   Sexual activity: Not on file  Other Topics Concern   Not on file  Social History Narrative   Married for 44 years. He has 3 children and 5 grandchildren. They also 5 great grandchildren. He lives with his wife. 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 -> [employer] education   Occasional beer or wine   No smoking history or illicit drug use.   One hour water aerobics sessions 3 days a week. Has not started back yet read from recent illness sounds.   Social Determinants of Health   Financial Resource Strain: Low Risk    Difficulty of Paying Living Expenses: Not hard at all  Food Insecurity: No Food Insecurity   Worried About Programme researcher, broadcasting/film/video in the Last Year: Never true   Ran Out  of Food in the Last Year: Never true  Transportation Needs: No Transportation Needs   Lack of Transportation (Medical): No   Lack of Transportation (Non-Medical): No  Physical Activity: Inactive   Days of Exercise per Week: 0 days   Minutes of Exercise per Session: 0 min  Stress: No Stress Concern Present   Feeling of Stress : Not at all  Social Connections: Moderately Isolated   Frequency of Communication with Friends and Family: More than three times a week   Frequency of Social Gatherings with Friends and Family: More than three times a week   Attends Religious Services: More than 4 times per year   Active Member of [employer] or Organizations: No   Attends Banker Meetings: Never   Marital Status: Widowed    Tobacco Counseling Counseling given: Not Answered   Clinical Intake:  Pre-visit preparation completed: Yes  Pain : No/denies pain     Nutritional Status: BMI > 30  Obese Nutritional Risks: None Diabetes: Yes CBG done?: No Did pt. bring in CBG monitor from home?: No  How often do you need to have someone help you when you  read instructions, pamphlets, or other written materials from your doctor or pharmacy?: 1 - Never  Diabetic?Nutrition Risk Assessment:  Has the patient had any N/V/D within the last 2 months?  No  Does the patient have any non-healing wounds?  No  Has the patient had any unintentional weight loss or weight gain?  No   Diabetes:  Is the patient diabetic?  Yes  If diabetic, was a CBG obtained today?  No  Did the patient bring in their glucometer from home?  No  How often do you monitor your CBG's? N/A.   Financial Strains and Diabetes Management:  Are you having any financial strains with the device, your supplies or your medication? No .  Does the patient want to be seen by Chronic Care Management for management of their diabetes?  No  Would the patient like to be referred to a Nutritionist or for Diabetic Management?  No   Diabetic Exams:  Diabetic Eye Exam: Completed 11/26/20 Diabetic Foot Exam: Overdue, Pt has been advised about the importance in completing this exam. Pt is scheduled for diabetic foot exam on next appt .   Interpreter Needed?: No  Information entered by :: Lanier Ensign, LPN   Activities of Daily Living In your present state of health, do you have any difficulty performing the following activities: 06/20/2021  Hearing? N  Vision? N  Difficulty concentrating or making decisions? N  Walking or climbing stairs? N  Dressing or bathing? N  Doing errands, shopping? N  Preparing Food and eating ? N  Using the Toilet? N  In the past six months, have you accidently leaked urine? N  Do you have problems with loss of bowel control? N  Managing your Medications? N  Managing your Finances? N  Housekeeping or managing your Housekeeping? N  Some recent data might be hidden    Patient Care Team: Shelva Majestic, MD as PCP - General (Family Medicine) Marykay Lex, MD as PCP - Cardiology (Cardiology) Casper Harrison. Quillian Quince, MD (Cardiology) Barron Alvine, MD  (Inactive) as Attending Physician (Urology) Ranelle Oyster, MD as Consulting Physician (Physical Medicine and Rehabilitation) Marykay Lex, MD as Consulting Physician (Cardiology) Opthamology, [facility] as Consulting Physician (Ophthalmology)  Indicate any recent Medical Services you may have received from other than [facility] providers in the past year (date  may be approximate).     Assessment:   This is a routine wellness examination for Eyoel.  Hearing/Vision screen Hearing Screening - Comments:: Pt denies any hearing issues  Vision Screening - Comments:: Pt follows up with provider for annual eye exams   Dietary issues and exercise activities discussed: Current Exercise Habits: The patient does not participate in regular exercise at present   Goals Addressed             This Visit's Progress    Patient Stated       Lose weight        Depression Screen PHQ 2/9 Scores 06/20/2021 09/21/2020 01/06/2020 12/22/2019 01/13/2019 07/13/2018 07/01/2018  PHQ - 2 Score 0 0 0 0 0 0 0    Fall Risk Fall Risk  06/20/2021 09/21/2020 01/06/2020 12/22/2019 07/13/2019  Falls in the past year? 0 0 0 0 0  Comment - - - - -  Number falls in past yr: 0 0 0 0 -  Injury with Fall? 0 0 0 0 -  Risk for fall due to : Impaired vision;Impaired balance/gait;Impaired mobility - History of fall(s);Impaired mobility Impaired balance/gait;Impaired mobility;History of fall(s) -  Risk for fall due to: Comment pt uses walker, cane and has motorized scooter to aid in mobility - - - -  Follow up Falls prevention discussed - Falls evaluation completed;Falls prevention discussed Education provided;Falls prevention discussed;Falls evaluation completed -    FALL RISK PREVENTION PERTAINING TO THE HOME:  Any stairs in or around the home? Yes  If so, are there any without handrails? No  Home free of loose throw rugs in walkways, pet beds, electrical cords, etc? Yes  Adequate lighting in your home to reduce risk of  falls? Yes   ASSISTIVE DEVICES UTILIZED TO PREVENT FALLS:  Life alert? No  Use of a cane, walker or w/c? Yes  Grab bars in the bathroom? Yes  Shower chair or bench in shower? Yes  Elevated toilet seat or a handicapped toilet? Yes   TIMED UP AND GO:  Was the test performed? No .  Cognitive Function: MMSE - Mini Mental State Exam 07/01/2018  Not completed: (No Data)     6CIT Screen 06/20/2021 12/22/2019  What Year? 0 points 0 points  What month? 0 points 0 points  What time? 0 points 0 points  Count back from 20 0 points 0 points  Months in reverse 0 points 0 points  Repeat phrase 0 points 0 points  Total Score 0 0    Immunizations Immunization History  Administered Date(s) Administered   Influenza Split 09/15/2011, 07/15/2012   Influenza Whole 06/25/1998, 05/31/2007, 07/17/2010   Influenza, High Dose Seasonal PF 09/24/2017   Influenza,inj,Quad PF,6+ Mos 07/14/2013, 07/30/2015   PFIZER Comirnaty(Gray Top)Covid-19 Tri-Sucrose Vaccine 09/27/2020, 02/28/2021   PFIZER(Purple Top)SARS-COV-2 Vaccination 01/12/2020, 02/03/2020   Td 08/26/1999, 12/08/2008    TDAP status: Due, Education has been provided regarding the importance of this vaccine. Advised may receive this vaccine at local pharmacy or Health Dept. Aware to provide a copy of the vaccination record if obtained from local pharmacy or Health Dept. Verbalized acceptance and understanding.  Flu Vaccine status: Due, Education has been provided regarding the importance of this vaccine. Advised may receive this vaccine at local pharmacy or Health Dept. Aware to provide a copy of the vaccination record if obtained from local pharmacy or Health Dept. Verbalized acceptance and understanding.  Pneumococcal vaccine status: Due, Education has been provided regarding the importance of this vaccine. Advised  may receive this vaccine at local pharmacy or Health Dept. Aware to provide a copy of the vaccination record if obtained from local  pharmacy or Health Dept. Verbalized acceptance and understanding.  Covid-19 vaccine status: Completed vaccines  Qualifies for Shingles Vaccine? Yes   Zostavax completed No   Shingrix Completed?: No.    Education has been provided regarding the importance of this vaccine. Patient has been advised to call insurance company to determine out of pocket expense if they have not yet received this vaccine. Advised may also receive vaccine at local pharmacy or Health Dept. Verbalized acceptance and understanding.  Screening Tests Health Maintenance  Topic Date Due   Pneumonia Vaccine 27+ Years old (1 - PCV) Never done   Zoster Vaccines- Shingrix (1 of 2) Never done   TETANUS/TDAP  12/09/2018   FOOT EXAM  01/05/2021   HEMOGLOBIN A1C  03/21/2021   INFLUENZA VACCINE  03/25/2021   COVID-19 Vaccine (5 - Booster for Pfizer series) 04/25/2021   COLONOSCOPY (Pts 45-54yrs Insurance coverage will need to be confirmed)  12/22/2023 (Originally 06/27/1996)   OPHTHALMOLOGY EXAM  11/26/2021   Hepatitis C Screening  Completed   HPV VACCINES  Aged Out    Health Maintenance  Health Maintenance Due  Topic Date Due   Pneumonia Vaccine 61+ Years old (1 - PCV) Never done   Zoster Vaccines- Shingrix (1 of 2) Never done   TETANUS/TDAP  12/09/2018   FOOT EXAM  01/05/2021   HEMOGLOBIN A1C  03/21/2021   INFLUENZA VACCINE  03/25/2021   COVID-19 Vaccine (5 - Booster for Pfizer series) 04/25/2021     Colorecatal cancer postponed until 12/22/23  Additional Screening:  Hepatitis C Screening:  Completed 07/30/15  Vision Screening: Recommended annual ophthalmology exams for early detection of glaucoma and other disorders of the eye. Is the patient up to date with their annual eye exam?  No Who is the provider or what is the name of the office in which the patient attends annual eye exams? [facility] opthalmology  If pt is not established with a provider, would they like to be referred to a provider to establish  care? No .   Dental Screening: Recommended annual dental exams for proper oral hygiene  Community Resource Referral / Chronic Care Management: CRR required this visit?  No   CCM required this visit?  No      Plan:     I have personally reviewed and noted the following in the patient's chart:   Medical and social history Use of alcohol, tobacco or illicit drugs  Current medications and supplements including opioid prescriptions. Patient is not currently taking opioid prescriptions. Functional ability and status Nutritional status Physical activity Advanced directives List of other physicians Hospitalizations, surgeries, and ER visits in previous 12 months Vitals Screenings to include cognitive, depression, and falls Referrals and appointments  In addition, I have reviewed and discussed with patient certain preventive protocols, quality metrics, and best practice recommendations. A written personalized care plan for preventive services as well as general preventive health recommendations were provided to patient.     Franklin Schlein, LPN   06/24/5944   Nurse Notes: Pt inquired if a doctors note about his mobility would offset the cost of his motorized scooter please advise.

## 2021-06-21 ENCOUNTER — Telehealth: Payer: Self-pay

## 2021-06-21 NOTE — Telephone Encounter (Signed)
Attempted to call patient, voicemail is full. Will try again

## 2021-06-21 NOTE — Telephone Encounter (Signed)
If he can bring information or forms that he needs that would be very helpful

## 2021-06-27 ENCOUNTER — Encounter: Payer: Self-pay | Admitting: Family Medicine

## 2021-06-27 ENCOUNTER — Other Ambulatory Visit: Payer: Self-pay

## 2021-06-27 ENCOUNTER — Ambulatory Visit (INDEPENDENT_AMBULATORY_CARE_PROVIDER_SITE_OTHER): Payer: Medicare PPO | Admitting: Family Medicine

## 2021-06-27 VITALS — BP 128/70 | HR 75 | Ht 72.0 in | Wt 390.0 lb

## 2021-06-27 DIAGNOSIS — G9520 Unspecified cord compression: Secondary | ICD-10-CM

## 2021-06-27 DIAGNOSIS — G8250 Quadriplegia, unspecified: Secondary | ICD-10-CM | POA: Diagnosis not present

## 2021-06-27 DIAGNOSIS — E785 Hyperlipidemia, unspecified: Secondary | ICD-10-CM

## 2021-06-27 DIAGNOSIS — I2699 Other pulmonary embolism without acute cor pulmonale: Secondary | ICD-10-CM | POA: Diagnosis not present

## 2021-06-27 DIAGNOSIS — E119 Type 2 diabetes mellitus without complications: Secondary | ICD-10-CM | POA: Diagnosis not present

## 2021-06-27 DIAGNOSIS — E1169 Type 2 diabetes mellitus with other specified complication: Secondary | ICD-10-CM

## 2021-06-27 DIAGNOSIS — Z0000 Encounter for general adult medical examination without abnormal findings: Secondary | ICD-10-CM

## 2021-06-27 DIAGNOSIS — I152 Hypertension secondary to endocrine disorders: Secondary | ICD-10-CM | POA: Diagnosis not present

## 2021-06-27 DIAGNOSIS — E1159 Type 2 diabetes mellitus with other circulatory complications: Secondary | ICD-10-CM

## 2021-06-27 MED ORDER — FLUTICASONE PROPIONATE 50 MCG/ACT NA SUSP: 2.0000 | Freq: Every day | NASAL | 3 refills | Status: DC

## 2021-06-27 NOTE — Progress Notes (Signed)
Phone: [phone]   Subjective:  Patient presents today for their annual physical. Chief complaint-noted.   See problem oriented charting- ROS- full  review of systems was completed and negative  except for: watery/itchy eyes, occasional throat clearing, occasional mild lightheadedness- not recently- better with good hydration  The following were reviewed and entered/updated in epic: Past Medical History:  Diagnosis Date   Arthritis    "knees" (12/17/2017)   CAD S/P percutaneous coronary angioplasty 09/20/2016   Nstemi 08/2016. Dr. Algie Coffer. DES- brilinta and asa. Requests change to [facility] cardiology; 95% Ramus -> PCI Resolute Onyx DES 2.75 x 18   Central cord syndrome (HCC) 12/17/2017   Chronic combined systolic and diastolic heart failure (HCC) 10/09/2016   EF 45% and grade II diastolic after nstemi   Diet-controlled diabetes mellitus (HCC)    DJD (degenerative joint disease)    Gout    High cholesterol    Hypertension    NSTEMI (non-ST elevated myocardial infarction) (HCC) 09/20/2016   95% Ramus - > PCI    Obesity    Recurrent pulmonary embolism (HCC) 11/'14; 4/'19   a) Bilateral segmental and subsegmental pulmonary emboli with mild RV Strain.; b) after fall with C-spine Fxr --> Acute PE of right main pulmonary artery extending into multiple segments.   Patient Active Problem List   Diagnosis Date Noted   Recurrent pulmonary embolism (HCC)     Priority: High   C4 spinal cord injury, sequela (HCC) 12/18/2017    Priority: High   Tetraplegia (HCC) 12/18/2017    Priority: High   Aortic aneurysm (HCC) 12/16/2017    Priority: High   Cord compression (HCC) 12/13/2017    Priority: High   Chronic combined systolic and diastolic heart failure (HCC) 10/09/2016    Priority: High   CAD S/P percutaneous coronary angioplasty 09/20/2016    Priority: High   Morbid obesity (HCC) 07/18/2013    Priority: High   Diabetes mellitus type 2, controlled (HCC) 07/17/2010    Priority: High    Bilateral lower extremity edema 12/08/2008    Priority: High   Angioedema of lips 07/13/2013    Priority: Medium    Hyperlipidemia associated with type 2 diabetes mellitus (HCC) 10/02/2011    Priority: Medium    BPH with urinary obstruction 07/17/2010    Priority: Medium    Gout 03/15/2007    Priority: Medium    Hypertension associated with diabetes (HCC) 03/15/2007    Priority: Medium    Pain of left heel     Priority: Low   Primary osteoarthritis of knees, bilateral 01/11/2016    Priority: Low   Erectile dysfunction 07/30/2015    Priority: Low   Multinodular goiter 07/13/2013    Priority: Low   SOB (shortness of breath) 10/02/2011    Priority: Low   NEUROPATHY, IDIOPATHIC PERIPHERAL NEC 05/31/2007    Priority: Low   Osteoarthritis 03/22/2007    Priority: Low   Preoperative cardiovascular examination 02/05/2021   Past Surgical History:  Procedure Laterality Date   CARDIAC CATHETERIZATION N/A 09/19/2016   Procedure: Left Heart Cath and Coronary Angiography;  Surgeon: Orpah Cobb, MD;  Location: [facility] INVASIVE CV LAB;  Service: Cardiovascular: 95% proximal Ramus Intermedius --> PCI   CARDIAC CATHETERIZATION N/A 09/19/2016   Procedure: Coronary Stent Intervention;  Surgeon: Yvonne Kendall, MD;  Location: [facility] INVASIVE CV LAB;  Service: Cardiovascular: 95% ramus intermedius;  Resolute Onyx 2.75 x 18 mm drug-eluting stent   CYSTOSCOPY/RETROGRADE/URETEROSCOPY/STONE EXTRACTION WITH BASKET  0981,1914   ureteral stone  MENISCUS REPAIR Left    knee open meniscetomy   TRANSTHORACIC ECHOCARDIOGRAM  2004   no lvh nl ejection fraction   TRANSTHORACIC ECHOCARDIOGRAM  09/19/2016   In setting of NSTEMI:  EF 45-50% with diffuse hypokinesis. GR 2 DD. Mild biatrial enlargement.    Family History  Problem Relation Age of Onset   Liver disease Mother     Medications- reviewed and updated Current Outpatient Medications  Medication Sig Dispense Refill   acetaminophen (TYLENOL) 650 MG CR  tablet Take 650-1,300 mg by mouth every 8 (eight) hours as needed for pain.     ammonium lactate (AMLACTIN) 12 % lotion Apply 1 application topically as needed for dry skin. 400 g 0   aspirin EC 81 MG tablet Take 1 tablet (81 mg total) by mouth daily. (Patient taking differently: Take 81 mg by mouth in the morning.) 90 tablet 3   atorvastatin (LIPITOR) 80 MG tablet TAKE 1 TABLET (80 MG TOTAL) BY MOUTH DAILY AT 6 PM. 90 tablet 3   Chromium Picolinate (CHROMIUM PICOLATE PO) Take 1 tablet by mouth 3 (three) times a week.     diclofenac sodium (VOLTAREN) 1 % GEL Apply 1 application topically 3 (three) times daily. Both knees (Patient taking differently: Apply 1 application topically 3 (three) times daily as needed (knee pain).) 3 Tube 4   fluticasone (FLONASE) 50 MCG/ACT nasal spray Place 2 sprays into both nostrils daily. 16 g 3   metoprolol tartrate (LOPRESSOR) 50 MG tablet Take 1 tablet (50 mg total) by mouth 2 (two) times daily. 180 tablet 3   Multiple Vitamin (MULTIVITAMIN WITH MINERALS) TABS tablet Take 1 tablet by mouth daily.     Potassium Citrate 15 MEQ (1620 MG) TBCR Take 1 tablet by mouth 2 (two) times daily.  11   terazosin (HYTRIN) 10 MG capsule TAKE 1 CAPSULE (10 MG TOTAL) BY MOUTH AT BEDTIME. 90 capsule 3   vitamin C (ASCORBIC ACID) 500 MG tablet Take 500 mg by mouth daily in the afternoon.     XARELTO 20 MG TABS tablet TAKE 1 TABLET BY MOUTH EVERY DAY WITH SUPPER 30 tablet 3   No current facility-administered medications for this visit.    Allergies-reviewed and updated Allergies  Allergen Reactions   Ace Inhibitors     Angioedema   Other Hives and Itching    msg    Social History   Social History Narrative   Widowed 02/2020 after over 40 years married.  He has 3 children and 5 grandchildren. They also 5 great grandchildren.       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 Oropharynx normal Neck: no thyromegaly CV: RRR no murmurs rubs or gallops Lungs: CTAB no crackles, wheeze, rhonchi Abdomen: soft/nontender/nondistended/normal bowel sounds. No rebound or guarding.  Ext: trace edema under compression stockings Skin: warm, dry Neuro: grossly normal, moves all extremities, PERRLA  Diabetic foot exam planned next visit- will not wear compression stockings that time     Assessment and Plan  70 y.o. male presenting for annual physical.  Health Maintenance counseling: 1. Anticipatory guidance: Patient counseled regarding regular dental exams -q6 months advised, eye exams -yearly,  avoiding smoking and second hand smoke, limiting alcohol to  2 beverages per day -right now not drinking but may try very rare cocktail in future- unchanged , no illicit drugs.   2. Risk factor reduction:  Advised patient of need for regular exercise and diet rich and fruits and vegetables to reduce risk of heart attack and stroke. Exercise- walks up ramp at home daily.  Considering joining a gym. Diet--trying to do chicken/fish mainly. Doing some more salads. Working on reducing fried foods and burgers. In the past unclear amount of weight gain as previously could not get onto scale with compression injury/tetraplegia.  Wt Readings from Last 3 Encounters:  06/27/21 (!) 390 lb (176.9 kg)  04/23/21 (!) 390 lb (176.9 kg)  02/04/21 (!) 390 lb (176.9 kg)  3. Immunizations/screenings/ancillary studies-  discuss Prevnar 20-next visit, Shingrix-at pharmacy, Tetanus/Tdap-at pharmacy, Omicron/Bivalent booster-recommended at pharmacy, and Flu shot-high-dose flu shot today- otherwise up-to-date. Immunization History  Administered Date(s) Administered   Influenza Split 09/15/2011, 07/15/2012   Influenza Whole 06/25/1998, 05/31/2007, 07/17/2010   Influenza, High Dose  Seasonal PF 09/24/2017   Influenza,inj,Quad PF,6+ Mos 07/14/2013, 07/30/2015   PFIZER Comirnaty(Gray Top)Covid-19 Tri-Sucrose Vaccine 09/27/2020, 02/28/2021   PFIZER(Purple Top)SARS-COV-2 Vaccination 01/12/2020, 02/03/2020   Td 08/26/1999, 12/08/2008  4. Prostate cancer screening- follows with urology for kidney stones.  PSA trend overall low risk for prostate cancer - opts out today Lab Results  Component Value Date   PSA 1.54 09/21/2020   PSA 1.22 09/24/2017   PSA 1.23 12/26/2014   5. Colon cancer screening - postponed until 2025per patient request in the past-discussed Cologuard today- he wants to reach out in January for this  6. Skin cancer screening- low risk due to melanin content. advised regular sunscreen use. Denies worrisome, changing, or new skin lesions.  7. Smoking associated screening (lung cancer screening, AAA screen 65-75, UA)- former smoker quit 1970s. No aneurysm on CT 07/13/13- no further scan needed 8. STD screening - patient opts out as monogamous  Status of chronic or acute concerns   #Cord compression/tetraplegia/sequela of C4 spinal cord injury- patient with severe injury April 2019- has used wheelchair since that time. Is able to walk with his walker as well (still doing this) Follows with Dr. Hermelinda Medicus now as needed (no recent visits) - sensation of incomplete voiding with stools. No constipation.  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 I believe this is medically necessary- will write rx for this - difficult to use self propel on wheelchair for long distances due to tetraplegia  # Recurrent pulmonary embolism  S: Prior DVT 2014 after traveling- believe led to PE. Recurrent PE 11/2017 after fall. Now on lifelong anticoagulation-Xarelto 20 mg A/P: asymptomatic- continue current rx  -Watch renal function with  Xarelto  #Chronic systolic and diastolic heart failure/hypertension S: Compliant lasix 3 days a week in the past- now just as needed, metoprolol 50mg  BID, terazosin 10mg  (also helps with BPH). Takes potassium through urology -concern to add edema with amlodipine and heart failure Home reading #s:no recent checks BP Readings from Last 3 Encounters:  06/27/21 128/70  04/23/21 122/66  03/12/21 130/60  A/P: For CHF-no increased edema or weight with as needed lasix (as far as we know- patient will try to weight oday)  Blood pressure mildly elevated initially but improved on repeat/well controlled  #CAD/hyperlipidemia/aortic aneurysm/aortic atherosclerosis. S: CAD followed by Dr. Herbie Baltimore with history of NSTEMI. Compliant with aspirin.  Also on Xarelto for recurrent PE. Dr. Herbie Baltimore or myself follows aortic aneurysm yearly with imaging-this was stable to number 04/13/2021-also on imaging noted aortic atherosclerosis  No chest pain shortness of breath above baseline  Lab Results  Component Value Date   CHOL 110 11/29/2019   HDL 33.30 (L) 11/29/2019   LDLCALC 61 11/29/2019   LDLDIRECT 72.0 10/09/2016   TRIG 80.0 11/29/2019   CHOLHDL 3 11/29/2019  A/P: CAD largely asymptomatic-continue current medication.  Hyperlipidemia at goal with LDL under 70-update lipid panel with labs today.  Also with aortic atherosclerosis LDL goal under 70.  For aortic aneurysm-technically we could push her lower blood pressure but I would be very hesitant to lower blood pressure as I think this could increase his fall risk and his aneurysm has been stable  # Diabetes/morbid obesity.  S: controlled on no rx previously.   For morbid obesity and diabetes exercise and diet- trying to watch his sugar and pasta. Does some limited exercise around the house- considering sagewell- considering aquatics  Lab Results  Component Value Date   HGBA1C 7.1 (H) 09/21/2020   HGBA1C 8.2 (H) 11/29/2019   HGBA1C 6.5 03/30/2018  A/P:  Hopefully diabetes controlled.  Update A1c today-continue without meds for now.  Continue to work on healthy eating/regular exercise -refer to healthy weight to wellness  #catch in left low back -for several weeks- worse with certain positions  - was mainly concerned about possible clot- think unlikely- discussed sports med or PT- he declines for now- thinks pol may help him- will reach out if he changes mind  #watery itchy eyes- started with season change. Sometimes a slight burning sensation. Mild runny nose.   -trial flonase  Recommended follow up: No follow-ups on file. Future Appointments  Date Time Provider Department Center  07/07/2022 10:15 AM [facility]   Lab/Order associations: fasting   ICD-10-CM   1. Preventative health care  Z00.00     2. Hypertension associated with diabetes (HCC)  E11.59    I15.2     3. Hyperlipidemia associated with type 2 diabetes mellitus (HCC)  E11.69 CBC with Differential/Platelet   E78.5 Comprehensive metabolic panel    Lipid panel    4. Controlled type 2 diabetes mellitus without complication, without long-term current use of insulin (HCC)  E11.9 Hemoglobin A1c    5. Recurrent pulmonary embolism (HCC)  I26.99     6. Morbid obesity (HCC)  E66.01     7. Tetraplegia (HCC)  G82.50 DME Wheelchair electric    8. Cord compression (HCC)  G95.20 DME Wheelchair electric      Meds ordered this encounter  Medications   fluticasone (FLONASE) 50 MCG/ACT nasal spray    Sig: Place 2 sprays into both nostrils daily.    Dispense:  16 g    Refill:  3    I,Harris Phan,acting as a scribe for Tana Conch, MD.,have documented all relevant documentation on the behalf of Tana Conch, MD,as directed by  Tana Conch, MD while in the presence of Tana Conch, MD.  I, Tana Conch, MD, have reviewed all documentation for this visit. The documentation on 06/27/21 for the exam, diagnosis, procedures, and orders are all  accurate and complete.   Return precautions advised.  Tana Conch, MD

## 2021-06-27 NOTE — Patient Instructions (Addendum)
Health Maintenance Due  Topic Date Due   Pneumonia Vaccine 30+ Years old (1 - PCV) -Prevnar 20 next visit.  Never done   Zoster Vaccines- Shingrix (1 of 2) - Please check with your pharmacy to see if they have the shingrix vaccine. If they do- please get this immunization and update [place] by phone call or mychart with dates you receive the vaccine  Never done   TETANUS/TDAP  - Recommend getting this vaccination at your local pharmacy just in case you were to get a cut/scrape/a significant insect bite or sting.  12/09/2018   FOOT EXAM  - we will do this in your next visit - make sure not to wear your compression socks on that day.  01/05/2021   INFLUENZA VACCINE  - High dose flu shot today before you leave.  03/25/2021   COVID-19 Vaccine (5 - Booster for [employer] series) - Recommend getting Omicron/Bivalent booster only at your local pharmacy! Please let us know when you have received this vaccination.  04/25/2021   Please stop by lab before you go If you have mychart- we will send your results within 3 business days of [place] receiving them.  If you do not have mychart- we will call you about results within 5 business days of [place] receiving them.  *please also note that you will see labs on mychart as soon as they post. I will later go in and write notes on them- will say "notes from Dr. Durene Cal"  I want to wish you a happy birthday!  Recommend scheduling a visit with your local dentist.  Team please obtain actual weight before patient leaves - he feels that he is able to get on scale.  Team please ear irrigate right ear and left ear if  he tolerates it - focus primarily on the right.  We will call you within two weeks about your referral to Healthy Weight to Wellness. If you do not hear within 2 weeks, give [place] a call.   Message me in January and I will order Cologuard at your request.  If you you ever decide that you want to consider looking into Physical Therapy/Sports Medicine in regards  to your back pain, just let me know.  Trial Flonase nasal spray - 2 sprays in each nostril daily.  Recommended follow up: Return in about 6 months (around 12/25/2021) for a follow-up or sooner if needed.

## 2021-07-03 ENCOUNTER — Other Ambulatory Visit: Payer: Self-pay

## 2021-07-03 DIAGNOSIS — E1159 Type 2 diabetes mellitus with other circulatory complications: Secondary | ICD-10-CM

## 2021-07-03 DIAGNOSIS — I152 Hypertension secondary to endocrine disorders: Secondary | ICD-10-CM

## 2021-07-03 DIAGNOSIS — E119 Type 2 diabetes mellitus without complications: Secondary | ICD-10-CM

## 2021-07-03 DIAGNOSIS — E1169 Type 2 diabetes mellitus with other specified complication: Secondary | ICD-10-CM

## 2021-07-03 NOTE — Progress Notes (Signed)
Patient is scheduled   

## 2021-07-04 ENCOUNTER — Other Ambulatory Visit: Payer: Self-pay

## 2021-07-04 ENCOUNTER — Other Ambulatory Visit (INDEPENDENT_AMBULATORY_CARE_PROVIDER_SITE_OTHER): Payer: Medicare PPO

## 2021-07-04 DIAGNOSIS — E1159 Type 2 diabetes mellitus with other circulatory complications: Secondary | ICD-10-CM

## 2021-07-04 DIAGNOSIS — E785 Hyperlipidemia, unspecified: Secondary | ICD-10-CM

## 2021-07-04 DIAGNOSIS — E119 Type 2 diabetes mellitus without complications: Secondary | ICD-10-CM

## 2021-07-04 DIAGNOSIS — I152 Hypertension secondary to endocrine disorders: Secondary | ICD-10-CM | POA: Diagnosis not present

## 2021-07-04 DIAGNOSIS — E1169 Type 2 diabetes mellitus with other specified complication: Secondary | ICD-10-CM

## 2021-07-05 LAB — LIPID PANEL
Cholesterol: 105 mg/dL (ref 0–200)
HDL: 35.9 mg/dL — ABNORMAL LOW (ref >39.00)
LDL Cholesterol: 56 mg/dL (ref 0–99)
NonHDL: 69.2
Total CHOL/HDL Ratio: 3
Triglycerides: 65 mg/dL (ref 0.0–149.0)
VLDL: 13 mg/dL (ref 0.0–40.0)

## 2021-07-05 LAB — COMPREHENSIVE METABOLIC PANEL
ALT: 18 U/L (ref 0–53)
AST: 19 U/L (ref 0–37)
Albumin: 3.6 g/dL (ref 3.5–5.2)
Alkaline Phosphatase: 92 U/L (ref 39–117)
BUN: 23 mg/dL (ref 6–23)
CO2: 29 mEq/L (ref 19–32)
Calcium: 9.1 mg/dL (ref 8.4–10.5)
Chloride: 102 mEq/L (ref 96–112)
Creatinine, Ser: 1.29 mg/dL (ref 0.40–1.50)
GFR: 55.58 mL/min — ABNORMAL LOW (ref >60.00)
Glucose, Bld: 159 mg/dL — ABNORMAL HIGH (ref 70–99)
Potassium: 4.7 mEq/L (ref 3.5–5.1)
Sodium: 137 mEq/L (ref 135–145)
Total Bilirubin: 0.6 mg/dL (ref 0.2–1.2)
Total Protein: 7.4 g/dL (ref 6.0–8.3)

## 2021-07-05 LAB — CBC WITH DIFFERENTIAL/PLATELET
Basophils Absolute: 0 10*3/uL (ref 0.0–0.1)
Basophils Relative: 0.6 % (ref 0.0–3.0)
Eosinophils Absolute: 0.2 10*3/uL (ref 0.0–0.7)
Eosinophils Relative: 2.7 % (ref 0.0–5.0)
HCT: 41.4 % (ref 39.0–52.0)
Hemoglobin: 13.3 g/dL (ref 13.0–17.0)
Lymphocytes Relative: 21.8 % (ref 12.0–46.0)
Lymphs Abs: 1.6 10*3/uL (ref 0.7–4.0)
MCHC: 32.3 g/dL (ref 30.0–36.0)
MCV: 92.8 fl (ref 78.0–100.0)
Monocytes Absolute: 0.6 10*3/uL (ref 0.1–1.0)
Monocytes Relative: 8.8 % (ref 3.0–12.0)
Neutro Abs: 4.8 10*3/uL (ref 1.4–7.7)
Neutrophils Relative %: 66.1 % (ref 43.0–77.0)
Platelets: 209 10*3/uL (ref 150.0–400.0)
RBC: 4.46 Mil/uL (ref 4.22–5.81)
RDW: 14.6 % (ref 11.5–15.5)
WBC: 7.3 10*3/uL (ref 4.0–10.5)

## 2021-07-05 LAB — HEMOGLOBIN A1C: Hgb A1c MFr Bld: 8.6 % — ABNORMAL HIGH (ref 4.6–6.5)

## 2021-07-08 ENCOUNTER — Other Ambulatory Visit: Payer: Self-pay

## 2021-07-08 MED ORDER — METFORMIN HCL 500 MG PO TABS: 500.0000 mg | ORAL_TABLET | Freq: Every day | ORAL | 3 refills | Status: DC

## 2021-09-09 ENCOUNTER — Other Ambulatory Visit: Payer: Self-pay | Admitting: Family Medicine

## 2021-09-09 ENCOUNTER — Other Ambulatory Visit: Payer: Self-pay | Admitting: Cardiology

## 2021-09-09 DIAGNOSIS — I5042 Chronic combined systolic (congestive) and diastolic (congestive) heart failure: Secondary | ICD-10-CM

## 2021-09-09 DIAGNOSIS — I2510 Atherosclerotic heart disease of native coronary artery without angina pectoris: Secondary | ICD-10-CM

## 2021-09-09 DIAGNOSIS — Z9861 Coronary angioplasty status: Secondary | ICD-10-CM

## 2021-09-09 DIAGNOSIS — E1159 Type 2 diabetes mellitus with other circulatory complications: Secondary | ICD-10-CM

## 2021-10-17 ENCOUNTER — Telehealth: Payer: Self-pay | Admitting: Family Medicine

## 2021-10-17 NOTE — Telephone Encounter (Signed)
Pt called with severe back pain. Thinks its his kidney- pt requested to be triaged- awaiting triage notes.

## 2021-10-17 NOTE — Telephone Encounter (Signed)
Noted  

## 2021-10-18 ENCOUNTER — Encounter: Payer: Self-pay | Admitting: Family Medicine

## 2021-10-18 ENCOUNTER — Ambulatory Visit: Payer: Medicare PPO | Admitting: Family Medicine

## 2021-10-18 ENCOUNTER — Other Ambulatory Visit: Payer: Self-pay

## 2021-10-18 VITALS — BP 128/76 | HR 71 | Temp 98.4°F | Ht 72.0 in | Wt 398.0 lb

## 2021-10-18 DIAGNOSIS — G8250 Quadriplegia, unspecified: Secondary | ICD-10-CM

## 2021-10-18 DIAGNOSIS — R319 Hematuria, unspecified: Secondary | ICD-10-CM

## 2021-10-18 DIAGNOSIS — I5042 Chronic combined systolic (congestive) and diastolic (congestive) heart failure: Secondary | ICD-10-CM | POA: Diagnosis not present

## 2021-10-18 DIAGNOSIS — M545 Low back pain, unspecified: Secondary | ICD-10-CM | POA: Diagnosis not present

## 2021-10-18 DIAGNOSIS — I2699 Other pulmonary embolism without acute cor pulmonale: Secondary | ICD-10-CM

## 2021-10-18 DIAGNOSIS — E119 Type 2 diabetes mellitus without complications: Secondary | ICD-10-CM | POA: Diagnosis not present

## 2021-10-18 LAB — POC URINALSYSI DIPSTICK (AUTOMATED)
Bilirubin, UA: NEGATIVE
Blood, UA: POSITIVE
Glucose, UA: NEGATIVE
Ketones, UA: NEGATIVE
Nitrite, UA: NEGATIVE
Protein, UA: NEGATIVE
Spec Grav, UA: 1.02 (ref 1.010–1.025)
Urobilinogen, UA: 0.2 E.U./dL
pH, UA: 6 (ref 5.0–8.0)

## 2021-10-18 MED ORDER — CEPHALEXIN 500 MG PO CAPS: 500.0000 mg | ORAL_CAPSULE | Freq: Three times a day (TID) | ORAL | 0 refills | Status: AC

## 2021-10-18 NOTE — Telephone Encounter (Signed)
Pt has an appt today with Franklin Hicks at 3:20pm  Patient Name: Franklin Hicks Gender: Male DOB: 1950-09-16 Age: 71 Y 3 M 20 D Return Phone Number: Address: City/ State/ Zip: [place]  [address] Client [place] [facility] Provider Garret Reddish- MD Contact Type Call Who Is Calling Patient / Member / Family / Caregiver Call Type Triage / Clinical Caller Name Franklin Hicks Relationship To Patient Provider Return Phone Number Please choose phone number Chief Complaint SEVERE ABDOMINAL PAIN - Severe pain in abdomen Reason for Call Symptomatic / Request for [place] stated that she is trying to get a patient triaged Translation No Disp. Time Eilene Ghazi Time) Disposition Final User 10/17/2021 3:10:53 PM Send to Urgent Linton Ham 10/17/2021 3:32:43 PM Send to RN Final Attempt Janine Limbo, RN, Christina 10/17/2021 3:32:35 PM FINAL ATTEMPT MADE - no message left Yes Noralee Stain, RN, Margreta Journey Comments User: Janith Lima Date/Time Eilene Ghazi Time): 10/17/2021 3:23:31 PM Provides office called to make sure patient was triaged, chart locked by RN User: Aletta Edouard, RN Date/Time Eilene Ghazi Time): 10/17/2021 3:31:14 PM No call back number listed. Reached out to lead PC who states the patient coordinator did not ask for the call back number. Per charge nurse okay to close the chart

## 2021-10-18 NOTE — Telephone Encounter (Signed)
Pt did not get triaged last night - pt would like to come in this afternoon to see dr Therapist, nutritional . Pt was offered 8:40am today however he was unable to make it.

## 2021-10-18 NOTE — Patient Instructions (Addendum)
Call cardiology for follow up   Thanks for doing labs today If you have mychart- we will send your results within 3 business days of [place] receiving them.  If you do not have mychart- we will call you about results within 5 business days of [place] receiving them.  *please also note that you will see labs on mychart as soon as they post. I will later go in and write notes on them- will say "notes from Dr. Yong Channel"  Treat possible UTI with keflex 3x a day for 7 days. If symptoms completely gone by day 7 can discard extra antibiotics.  -if culture ends up showing no infection and you are still hurting - likely do ct scan  Recommended follow up: Return in about 4 months (around 02/15/2022) for follow up- or sooner if needed. -cancel march visit before you leave and lets keep the November visit for physical OR sooner if a1c high- may need to do 4 month check in

## 2021-10-18 NOTE — Telephone Encounter (Signed)
Spoke with Dr. Durene Cal and he said see if pt can come at 3 pm.

## 2021-10-18 NOTE — Progress Notes (Signed)
Phone [phone] In person visit   Subjective:   Franklin Hicks is a 71 y.o. year old very pleasant male patient who presents for/with See problem oriented charting Chief Complaint  Patient presents with   Abdominal Pain    Pt states he has an annoying pain in his back that he thinks is from his kidneys. He feels like a strain in his LLQ. He denies n/v/d.    This visit occurred during the SARS-CoV-2 public health emergency.  Safety protocols were in place, including screening questions prior to the visit, additional usage of staff PPE, and extensive cleaning of exam room while observing appropriate contact time as indicated for disinfecting solutions.   Past Medical History-  Patient Active Problem List   Diagnosis Date Noted   Recurrent pulmonary embolism ([place])     Priority: High   C4 spinal cord injury, sequela ([place]) 12/18/2017    Priority: High   Tetraplegia ([place]) 12/18/2017    Priority: High   Aortic aneurysm ([place]) 12/16/2017    Priority: High   Cord compression ([place]) 12/13/2017    Priority: High   Chronic combined systolic and diastolic heart failure ([place]) 10/09/2016    Priority: High   CAD S/P percutaneous coronary angioplasty 09/20/2016    Priority: High   Morbid obesity ([place]) 07/18/2013    Priority: High   Diabetes mellitus type 2, controlled ([place]) 07/17/2010    Priority: High   Bilateral lower extremity edema 12/08/2008    Priority: High   Angioedema of lips 07/13/2013    Priority: Medium    BPH with urinary obstruction 07/17/2010    Priority: Medium    Gout 03/15/2007    Priority: Medium    Essential hypertension 03/15/2007    Priority: Medium    Pain of left heel     Priority: Low   Primary osteoarthritis of knees, bilateral 01/11/2016    Priority: Low   Erectile dysfunction 07/30/2015    Priority: Low   Multinodular goiter 07/13/2013    Priority: Low   SOB (shortness of breath) 10/02/2011    Priority: Low   NEUROPATHY, IDIOPATHIC PERIPHERAL NEC  05/31/2007    Priority: Low   Osteoarthritis 03/22/2007    Priority: Low   Preoperative cardiovascular examination 02/05/2021    Medications- reviewed and updated Current Outpatient Medications  Medication Sig Dispense Refill   acetaminophen (TYLENOL) 650 MG CR tablet Take 650-1,300 mg by mouth every 8 (eight) hours as needed for pain.     ammonium lactate (AMLACTIN) 12 % lotion Apply 1 application topically as needed for dry skin. 400 g 0   aspirin EC 81 MG tablet Take 1 tablet (81 mg total) by mouth daily. (Patient taking differently: Take 81 mg by mouth in the morning.) 90 tablet 3   atorvastatin (LIPITOR) 80 MG tablet TAKE 1 TABLET (80 MG TOTAL) BY MOUTH DAILY AT 6 PM. 90 tablet 3   Chromium Picolinate (CHROMIUM PICOLATE PO) Take 1 tablet by mouth 3 (three) times a week.     diclofenac sodium (VOLTAREN) 1 % GEL Apply 1 application topically 3 (three) times daily. Both knees (Patient taking differently: Apply 1 application topically 3 (three) times daily as needed (knee pain).) 3 Tube 4   fluticasone (FLONASE) 50 MCG/ACT nasal spray SPRAY 2 SPRAYS INTO EACH NOSTRIL EVERY DAY 48 mL 1   metoprolol tartrate (LOPRESSOR) 50 MG tablet TAKE 1 TABLET BY MOUTH TWICE A DAY 180 tablet 1   Multiple Vitamin (MULTIVITAMIN WITH MINERALS) TABS tablet Take  1 tablet by mouth daily.     Potassium Citrate 15 MEQ (1620 MG) TBCR Take 1 tablet by mouth 2 (two) times daily.  11   terazosin (HYTRIN) 10 MG capsule TAKE 1 CAPSULE (10 MG TOTAL) BY MOUTH AT BEDTIME. 90 capsule 3   vitamin C (ASCORBIC ACID) 500 MG tablet Take 500 mg by mouth daily in the afternoon.     XARELTO 20 MG TABS tablet TAKE 1 TABLET BY MOUTH EVERY DAY WITH SUPPER 30 tablet 3   cephALEXin (KEFLEX) 500 MG capsule Take 1 capsule (500 mg total) by mouth 3 (three) times daily for 10 days. 30 capsule 0   metFORMIN (GLUCOPHAGE) 500 MG tablet Take 1 tablet (500 mg total) by mouth daily with breakfast. (Patient not taking: Reported on 10/18/2021) 90  tablet 3   No current facility-administered medications for this visit.     Objective:  BP 128/76    Pulse 71    Temp 98.4 F (36.9 C)    Ht 6' (1.829 m)    Wt (!) 398 lb (180.5 kg)    SpO2 95%    BMI 53.98 kg/m  Gen: NAD, resting comfortably CV: RRR no murmurs rubs or gallops Lungs: CTAB no crackles, wheeze, rhonchi Ext: no edema Skin: warm, dry  Results for orders placed or performed in visit on 10/18/21 (from the past 24 hour(s))  POCT Urinalysis Dipstick (Automated)     Status: Abnormal   Collection Time: 10/18/21  4:39 PM  Result Value Ref Range   Color, UA yellow    Clarity, UA clear    Glucose, UA Negative Negative   Bilirubin, UA neg    Ketones, UA neg    Spec Grav, UA 1.020 1.010 - 1.025   Blood, UA positive    pH, UA 6.0 5.0 - 8.0   Protein, UA Negative Negative   Urobilinogen, UA 0.2 0.2 or 1.0 E.U./dL   Nitrite, UA neg    Leukocytes, UA Small (1+) (A) Negative      Assessment and Plan   #Left low back pain with polyuria/nocturia S: Patient complains of left lower  back pain-he wonders if it is coming from his kidneys.  No nausea/vomiting/diarrhea reported. No fever or chills. No radiation into the leg.   He did have some mild pain in his stomach at times back in November at physical he states but current pain is different and started 2-3 days ago. Does ok if just sitting but if lays on left side and he is a side sleeper mainly. Less severe if lays on back but still bothers him and hard to sleep. Has not slept well as a result  Some polyuria and nocturia  Car seat in car helps some. Has not tried ice.  A/P: UA results concerning for possible UTI-we will treat with Keflex based on polyuria and left CVA tenderness.  On exam he has rather superficial pain and I wonder if he has a muscular strain that heating pad has been helpful and encouraged him to continue.  We will await urine culture for confirmation of infection.  If urine culture is negative-patient does  have history of nephrolithiasis-consider noncontrast CT for more information and possible referral back to urology-he reports last seeing Dr. Risa Grill years ago   % Cord compression/tetraplegia/sequela of C4 spinal cord injury- patient with severe injury April 2019- has used wheelchair since that time.  Is able to walk with his walker as well- just needs help getting chair into carFollows with  Dr. Tessa Lerner now as needed. Overall stable  # Recurrent pulmonary embolism  S: Prior DVT 2014 after traveling- believe led to PE. Recurrent PE 11/2017 after fall. Now on lifelong anticoagulation-Xarelto 20 mg A/P: no obvious recurrence- continue current meds for prevention  . #Chronic systolic and diastolic heart failure/hypertension S: Compliant lasix 3 days a week in the past- now just as needed, metoprolol 50mg  BID, terazosin 10mg  (also helps with BPH). Takes potassium through urology For CHF- has been stable with Lasix 3 times a week in past.  Reportsn= now just as needed per Dr. Ellyn Hack  BP Readings from Last 3 Encounters:  10/18/21 128/76  06/27/21 128/70  04/23/21 122/66  A/P: CHF stable thogh weight up some- he states weight was estimate today- he states went up some over the holidays he is trying to cut back on fried foods- doing more salads. Thinks could cut back on late night eating. Also trying golo.   HTN stable- continue current meds   #CAD/hyperlipidemia/aortic aneurysm. S: CAD followed by Dr. Ellyn Hack with history of NSTEMI.  Compliant with aspirin.  Also on Xarelto for recurrent PE.  Dr. Ellyn Hack follows aortic aneurysm yearly with imaging- last check was 10/02/20 and will be due at next visit  -denies cp or son  Lipids at goal on atorvastatin 80mg  Lab Results  Component Value Date   CHOL 105 07/04/2021   HDL 35.90 (L) 07/04/2021   LDLCALC 56 07/04/2021   LDLDIRECT 72.0 10/09/2016   TRIG 65.0 07/04/2021   CHOLHDL 3 07/04/2021   A/P: excellent control lipids last visit- continue current  meds.  CAD asymptomatic- continue current meds. Aneurysm has been stbale and BP controlled- continue follow up with cardiology  # Diabetes/morbid obesity.  S:  controlled on no rx previously.   CBGs- not checking Exercise and diet- has made some significant changes- states was doing too many sweets previously but has cut down heavily.  - does some limited exercise around the house- gets up and down his ramp- wraps around most of his house Lab Results  Component Value Date   HGBA1C 8.6 (H) 07/04/2021   HGBA1C 7.1 (H) 09/21/2020   HGBA1C 8.2 (H) 11/29/2019   A/P: we are hoping that the a1c is trending down- hed prefer to remain off of meds which as long as under 8 I think its reasonable.  He really wants to work on lifestyle Weight up- Encouraged need for healthy eating, regular exercise, weight loss.   Recommended follow up: Return in about 4 months (around 02/15/2022) for follow up- or sooner if needed. Future Appointments  Date Time Provider [place]  07/01/2022  2:40 PM Marin Olp, MD [facility]  07/07/2022 10:15 AM [facility] COACH [facility]   Lab/Order associations:   ICD-10-CM   1. Acute left-sided low back pain without sciatica  M54.50 Comprehensive metabolic panel    Urine Culture    POCT Urinalysis Dipstick (Automated)    POCT Urinalysis Dipstick (Automated)    Urine Culture    Comprehensive metabolic panel    2. Controlled type 2 diabetes mellitus without complication, without long-term current use of insulin (H[facility])  E11.9 Hemoglobin A1c    Hemoglobin A1c    3. Hematuria, unspecified type  R31.9 Urine Microscopic    4. Tetraplegia (H[facility]) Chronic G82.50     5. Recurrent pulmonary embolism (H[facility]) Chronic I26.99     6. Chronic combined systolic and diastolic heart failure (HCC) Chronic I50.42     7. Morbid obesity (  [place]) Chronic E66.01      Meds ordered this encounter  Medications   cephALEXin (KEFLEX) 500 MG capsule    Sig: Take 1 capsule (500  mg total) by mouth 3 (three) times daily for 10 days.    Dispense:  30 capsule    Refill:  0   Return precautions advised.  Garret Reddish, MD

## 2021-10-20 LAB — URINE CULTURE
MICRO NUMBER:: 13054650
SPECIMEN QUALITY:: ADEQUATE

## 2021-10-20 LAB — COMPREHENSIVE METABOLIC PANEL
AG Ratio: 1 (calc) (ref 1.0–2.5)
ALT: 18 U/L (ref 9–46)
AST: 20 U/L (ref 10–35)
Albumin: 3.4 g/dL — ABNORMAL LOW (ref 3.6–5.1)
Alkaline phosphatase (APISO): 100 U/L (ref 35–144)
BUN: 17 mg/dL (ref 7–25)
CO2: 22 mmol/L (ref 20–32)
Calcium: 8.9 mg/dL (ref 8.6–10.3)
Chloride: 100 mmol/L (ref 98–110)
Creat: 1.27 mg/dL (ref 0.70–1.28)
Globulin: 3.5 g/dL (calc) (ref 1.9–3.7)
Glucose, Bld: 146 mg/dL — ABNORMAL HIGH (ref 65–99)
Potassium: 4.8 mmol/L (ref 3.5–5.3)
Sodium: 135 mmol/L (ref 135–146)
Total Bilirubin: 0.6 mg/dL (ref 0.2–1.2)
Total Protein: 6.9 g/dL (ref 6.1–8.1)

## 2021-10-20 LAB — HEMOGLOBIN A1C
Hgb A1c MFr Bld: 7.9 % of total Hgb — ABNORMAL HIGH (ref <5.7)
Mean Plasma Glucose: 180 mg/dL
eAG (mmol/L): 10 mmol/L

## 2021-10-25 ENCOUNTER — Ambulatory Visit: Payer: Medicare PPO | Admitting: Family Medicine

## 2021-11-28 DIAGNOSIS — H40023 Open angle with borderline findings, high risk, bilateral: Secondary | ICD-10-CM | POA: Diagnosis not present

## 2021-11-28 DIAGNOSIS — H52203 Unspecified astigmatism, bilateral: Secondary | ICD-10-CM | POA: Diagnosis not present

## 2021-11-28 DIAGNOSIS — E119 Type 2 diabetes mellitus without complications: Secondary | ICD-10-CM | POA: Diagnosis not present

## 2021-11-28 DIAGNOSIS — H5213 Myopia, bilateral: Secondary | ICD-10-CM | POA: Diagnosis not present

## 2021-11-28 LAB — HM DIABETES EYE EXAM

## 2021-12-12 ENCOUNTER — Telehealth: Payer: Self-pay

## 2021-12-12 NOTE — Telephone Encounter (Signed)
Successfully faxed requested information to [employer] at [phone] on 12/12/2021. ?

## 2021-12-31 ENCOUNTER — Other Ambulatory Visit: Payer: Self-pay | Admitting: Family Medicine

## 2022-02-05 ENCOUNTER — Other Ambulatory Visit: Payer: Self-pay | Admitting: Family Medicine

## 2022-02-12 ENCOUNTER — Encounter: Payer: Self-pay | Admitting: Family Medicine

## 2022-03-06 ENCOUNTER — Other Ambulatory Visit: Payer: Self-pay | Admitting: Cardiology

## 2022-03-06 ENCOUNTER — Other Ambulatory Visit: Payer: Self-pay | Admitting: Family Medicine

## 2022-03-06 DIAGNOSIS — I5042 Chronic combined systolic (congestive) and diastolic (congestive) heart failure: Secondary | ICD-10-CM

## 2022-03-06 DIAGNOSIS — E1159 Type 2 diabetes mellitus with other circulatory complications: Secondary | ICD-10-CM

## 2022-03-06 DIAGNOSIS — I2510 Atherosclerotic heart disease of native coronary artery without angina pectoris: Secondary | ICD-10-CM

## 2022-03-10 ENCOUNTER — Encounter: Payer: Self-pay | Admitting: Family Medicine

## 2022-03-11 ENCOUNTER — Ambulatory Visit: Payer: Medicare PPO | Admitting: Family Medicine

## 2022-03-11 ENCOUNTER — Encounter: Payer: Self-pay | Admitting: Family Medicine

## 2022-03-11 VITALS — BP 138/70 | HR 78 | Temp 98.0°F | Ht 72.0 in | Wt 390.0 lb

## 2022-03-11 DIAGNOSIS — R319 Hematuria, unspecified: Secondary | ICD-10-CM | POA: Diagnosis not present

## 2022-03-11 DIAGNOSIS — I10 Essential (primary) hypertension: Secondary | ICD-10-CM

## 2022-03-11 DIAGNOSIS — M545 Low back pain, unspecified: Secondary | ICD-10-CM

## 2022-03-11 DIAGNOSIS — R350 Frequency of micturition: Secondary | ICD-10-CM

## 2022-03-11 LAB — POCT URINALYSIS DIPSTICK
Bilirubin, UA: NEGATIVE
Glucose, UA: NEGATIVE
Ketones, UA: NEGATIVE
Nitrite, UA: NEGATIVE
Protein, UA: NEGATIVE
Spec Grav, UA: 1.01 (ref 1.010–1.025)
Urobilinogen, UA: 0.2 E.U./dL
pH, UA: 7 (ref 5.0–8.0)

## 2022-03-11 MED ORDER — TIZANIDINE HCL 2 MG PO TABS: 2.0000 mg | ORAL_TABLET | Freq: Three times a day (TID) | ORAL | 1 refills | Status: DC | PRN

## 2022-03-11 NOTE — Patient Instructions (Signed)
We we will wait on treating potential urinary tract infection until we get culture back hopefully by Thursday night or Friday  You are very tender on exam in the paraspinous muscles pointing more towards musculoskeletal cause of pain including muscle spasms-lets try tizanidine as needed (muscle relaxant).  Could also refer to physical therapy but you wanted to try medication first-please let us know if not improving within a week or 2 or if you have new or worsening symptoms or if pain lasts more than 4-6 weeks

## 2022-03-11 NOTE — Progress Notes (Signed)
Phone [phone] In person visit   Subjective:   Franklin Hicks is a 71 y.o. year old very pleasant male patient who presents for/with See problem oriented charting Chief Complaint  Patient presents with   Back Pain    Pt c/o back spasms and not sure if it is muscle related or kidney related. Has problems getting urine stream going.   Past Medical History-  Patient Active Problem List   Diagnosis Date Noted   Recurrent pulmonary embolism (H[facility])     Priority: High   C4 spinal cord injury, sequela ([facility]) 12/18/2017    Priority: High   Tetraplegia ([facility]) 12/18/2017    Priority: High   Aortic aneurysm ([facility]) 12/16/2017    Priority: High   Cord compression ([facility]) 12/13/2017    Priority: High   Chronic combined systolic and diastolic heart failure ([facility]) 10/09/2016    Priority: High   CAD S/P percutaneous coronary angioplasty 09/20/2016    Priority: High   Morbid obesity ([facility]) 07/18/2013    Priority: High   Diabetes mellitus type 2, controlled ([facility]) 07/17/2010    Priority: High   Bilateral lower extremity edema 12/08/2008    Priority: High   Angioedema of lips 07/13/2013    Priority: Medium    BPH with urinary obstruction 07/17/2010    Priority: Medium    Gout 03/15/2007    Priority: Medium    Essential hypertension 03/15/2007    Priority: Medium    Pain of left heel     Priority: Low   Primary osteoarthritis of knees, bilateral 01/11/2016    Priority: Low   Erectile dysfunction 07/30/2015    Priority: Low   Multinodular goiter 07/13/2013    Priority: Low   SOB (shortness of breath) 10/02/2011    Priority: Low   NEUROPATHY, IDIOPATHIC PERIPHERAL NEC 05/31/2007    Priority: Low   Osteoarthritis 03/22/2007    Priority: Low   Preoperative cardiovascular examination 02/05/2021    Medications- reviewed and updated Current Outpatient Medications  Medication Sig Dispense Refill   acetaminophen (TYLENOL) 650 MG CR tablet Take 650-1,300 mg by mouth every 8 (eight) hours as  needed for pain.     ammonium lactate (AMLACTIN) 12 % lotion Apply 1 application topically as needed for dry skin. 400 g 0   aspirin EC 81 MG tablet Take 1 tablet (81 mg total) by mouth daily. (Patient taking differently: Take 81 mg by mouth in the morning.) 90 tablet 3   atorvastatin (LIPITOR) 80 MG tablet TAKE 1 TABLET BY MOUTH DAILY AT 6 PM. 90 tablet 3   Chromium Picolinate (CHROMIUM PICOLATE PO) Take 1 tablet by mouth 3 (three) times a week.     diclofenac sodium (VOLTAREN) 1 % GEL Apply 1 application topically 3 (three) times daily. Both knees (Patient taking differently: Apply 1 application  topically 3 (three) times daily as needed (knee pain).) 3 Tube 4   fluticasone (FLONASE) 50 MCG/ACT nasal spray SPRAY 2 SPRAYS INTO EACH NOSTRIL EVERY DAY 48 mL 1   metFORMIN (GLUCOPHAGE) 500 MG tablet Take 1 tablet (500 mg total) by mouth daily with breakfast. 90 tablet 3   metoprolol tartrate (LOPRESSOR) 50 MG tablet TAKE 1 TABLET BY MOUTH TWICE A DAY 15 tablet 0   Multiple Vitamin (MULTIVITAMIN WITH MINERALS) TABS tablet Take 1 tablet by mouth daily.     Potassium Citrate 15 MEQ (1620 MG) TBCR Take 1 tablet by mouth 2 (two) times daily.  11   terazosin (HYTRIN) 10 MG capsule  TAKE 1 CAPSULE BY MOUTH AT BEDTIME. 90 capsule 3   tiZANidine (ZANAFLEX) 2 MG tablet Take 1 tablet (2 mg total) by mouth 3 (three) times daily as needed for muscle spasms (do not drive for 6 hours after taking). 30 tablet 1   vitamin C (ASCORBIC ACID) 500 MG tablet Take 500 mg by mouth daily in the afternoon.     XARELTO 20 MG TABS tablet TAKE 1 TABLET BY MOUTH EVERY DAY WITH SUPPER 30 tablet 3   No current facility-administered medications for this visit.     Objective:  BP 138/70   Pulse 78   Temp 98 F (36.7 C)   Ht 6' (1.829 m)   Wt (!) 390 lb (176.9 kg)   SpO2 98%   BMI 52.89 kg/m  Gen: NAD, resting comfortably CV: RRR no murmurs rubs or gallops Lungs: CTAB no crackles, wheeze, rhonchi Ext: Significant baseline  edema nonpitting primarily Skin: warm, dry Neuro: wheelchair bound but baseline level of strength in bilateral legs and arms- known tetraplegia.  MSK: No midline back pain.  Some pain in lower thoracic paraspinous muscles with palpation as well as some spasm on the right-very tender in this area-similar but less severe spasm and paraspinous muscles in right lumbar spine Results for orders placed or performed in visit on 03/11/22 (from the past 24 hour(s))  POCT Urinalysis Dipstick     Status: Abnormal   Collection Time: 03/11/22  4:09 PM  Result Value Ref Range   Color, UA yellow    Clarity, UA cloudy    Glucose, UA Negative Negative   Bilirubin, UA negative    Ketones, UA negative    Spec Grav, UA 1.010 1.010 - 1.025   Blood, UA trace    pH, UA 7.0 5.0 - 8.0   Protein, UA Negative Negative   Urobilinogen, UA 0.2 0.2 or 1.0 E.U./dL   Nitrite, UA negative    Leukocytes, UA Trace (A) Negative   Appearance     Odor         Assessment and Plan   # Bilateral low Back pain-worse on the right S:has had some back pain in both right and low back. Went on vacation last week to [place] and enjoyed it other than car issues (still has to go back and pick up his car which is having work done).  He tends to have a baseline level of lower back pain and thoracic spine pain but intensified  and had spasms in last several days- worse with movement primarily. Urinating seems to help discomfort some- hard to get to bathroom due to pain at times but denies incontinence. No frequent urination or burning with peeing.  Pain level sitting ok- movement really bothers him. Reaching down is the main trigger for issues-relief of pain by sitting still and no spasm/pain with this. Some pain when laying down but doesn't wake from sleep.  -no pain medicine yet even tylenol, mild help with heat -in past has had pain issues in the back particularly with sneezes and has had severl  Previous imaging-prior cord  compression issues in cervical spine but none of lumbar spine. No neck pain  ROS-No saddle anesthesia, bladder incontinence, fecal incontinence, weakness in extremity above baseline, numbness or tingling in extremity. History negative for trauma, history of cancer, fever, chills, unintentional weight loss, recent bacterial infection, recent IV drug use, HIV, pain worse at night or while supine.  A/P: Bilateral low back pain without sciatica-suspect underlying degenerative disc disease  with recent flare and associated muscle spasm.  Will trial tizanidine has been helpful in the past back in 2020 when he had similar pain -Urinalysis with trace leukocytes and trace blood-we will see if team can add on urine microscopic. -Also sending out urine culture though doubt UTI with pain primarily with movement.  If blood in the urine is confirmed and pain is not improving over the coming weeks could consider CT noncontrast with history of kidney stones - We did not opt for empiric treatment for UTI but would treat if noted on culture as it was last visit -We discussed updating imaging of lumbar spine if pain persists for more than 4 to 6 weeks  #hypertension S: medication: Terazosin 10 mg, metoprolol 50 mg twice daily.  Has been off of furosemide recently BP Readings from Last 3 Encounters:  03/11/22 138/70  10/18/21 128/76  06/27/21 128/70  A/P: High normal but controlled-continue current medication   Recommended follow up: Return for as needed for new, worsening, persistent symptoms. Future Appointments  Date Time Provider Department Center  07/01/2022  3:00 PM Shelva Majestic, MD [facility]  07/07/2022 10:15 AM [facility] CO[facility]    Lab/Order associations:   ICD-10-CM   1. Urinary frequency  R35.0 POCT Urinalysis Dipstick    Urine Culture    Urine Culture    2. Acute left-sided low back pain without sciatica  M54.50 POCT Urinalysis Dipstick    Urine Culture    Urine Culture     3. Hematuria, unspecified type  R31.9 Urine Microscopic    4. Essential hypertension  I10       Meds ordered this encounter  Medications   tiZANidine (ZANAFLEX) 2 MG tablet    Sig: Take 1 tablet (2 mg total) by mouth 3 (three) times daily as needed for muscle spasms (do not drive for 6 hours after taking).    Dispense:  30 tablet    Refill:  1    Return precautions advised.  Tana Conch, MD

## 2022-03-12 ENCOUNTER — Telehealth: Payer: Self-pay | Admitting: Cardiology

## 2022-03-12 LAB — URINALYSIS, MICROSCOPIC ONLY

## 2022-03-12 NOTE — Telephone Encounter (Signed)
*  STAT* If patient is at the pharmacy, call can be transferred to refill team.   1. Which medications need to be refilled? (please list name of each medication and dose if known)  metoprolol tartrate (LOPRESSOR) 50 MG tablet  2. Which pharmacy/location (including street and city if local pharmacy) is medication to be sent to? [id number] - [place], [place] - [address]  3. Do they need a 30 day or 90 day supply?  30 day supply  Patient is scheduled for 8/21 with Azalee Course, PA.

## 2022-03-13 ENCOUNTER — Other Ambulatory Visit: Payer: Self-pay

## 2022-03-13 DIAGNOSIS — R319 Hematuria, unspecified: Secondary | ICD-10-CM

## 2022-03-13 LAB — URINE CULTURE
MICRO NUMBER:: 13661707
SPECIMEN QUALITY:: ADEQUATE

## 2022-04-14 ENCOUNTER — Ambulatory Visit: Payer: Medicare PPO | Admitting: Physician Assistant

## 2022-04-14 ENCOUNTER — Encounter: Payer: Self-pay | Admitting: Physician Assistant

## 2022-04-14 VITALS — BP 134/70 | HR 71 | Ht 72.0 in | Wt 390.0 lb

## 2022-04-14 DIAGNOSIS — E785 Hyperlipidemia, unspecified: Secondary | ICD-10-CM

## 2022-04-14 DIAGNOSIS — I152 Hypertension secondary to endocrine disorders: Secondary | ICD-10-CM

## 2022-04-14 DIAGNOSIS — I2699 Other pulmonary embolism without acute cor pulmonale: Secondary | ICD-10-CM

## 2022-04-14 DIAGNOSIS — I2510 Atherosclerotic heart disease of native coronary artery without angina pectoris: Secondary | ICD-10-CM | POA: Diagnosis not present

## 2022-04-14 DIAGNOSIS — E1159 Type 2 diabetes mellitus with other circulatory complications: Secondary | ICD-10-CM

## 2022-04-14 DIAGNOSIS — I5042 Chronic combined systolic (congestive) and diastolic (congestive) heart failure: Secondary | ICD-10-CM

## 2022-04-14 DIAGNOSIS — Z9861 Coronary angioplasty status: Secondary | ICD-10-CM

## 2022-04-14 DIAGNOSIS — I447 Left bundle-branch block, unspecified: Secondary | ICD-10-CM

## 2022-04-14 MED ORDER — RIVAROXABAN 20 MG PO TABS: 20.0000 mg | ORAL_TABLET | Freq: Every day | ORAL | 3 refills | Status: DC

## 2022-04-14 MED ORDER — METOPROLOL TARTRATE 50 MG PO TABS: 50.0000 mg | ORAL_TABLET | Freq: Two times a day (BID) | ORAL | 3 refills | Status: DC

## 2022-04-14 NOTE — Progress Notes (Signed)
Cardiology Office Note:    Date:  04/16/2022   ID:  Franklin Hicks, DOB 1951-02-14, MRN [id number]  PCP:  Shelva Majestic, MD   [facility] Providers Cardiologist:  Bryan Lemma, MD     Referring MD: Shelva Majestic, MD   No chief complaint on file.   History of Present Illness:    Franklin Hicks is a 71 y.o. male with a hx of CAD, central cord syndrome, chronic combined systolic and diastolic heart failure, hypertension, hyperlipidemia, and recurrent PE.  Previously underwent cardiac catheterization in 2018 which revealed single-vessel disease, patient was treated with DES to ramus intermedius.  Echocardiogram obtained on 09/13/2016 showed EF 45 to 50%, grade 2 DD.  He has had no further chest pain since.  Patient was last seen by Dr. Herbie Baltimore in June 2022 at which time low extremity edema is being controlled with support stockings.  He was on aspirin and Xarelto at the time given history of CAD and PE.  Patient presents today for follow-up.  He denies any recent chest pain or significant worsening dyspnea on exertion.  On physical exam, he appears to be euvolemic.  EKG however shows new left bundle branch block.  He does not have any anginal symptom.  I recommended repeat echocardiogram to make sure ejection fraction is still stable.   Past Medical History:  Diagnosis Date   Arthritis    "knees" (12/17/2017)   CAD S/P percutaneous coronary angioplasty 09/20/2016   Nstemi 08/2016. Dr. Algie Coffer. DES- brilinta and asa. Requests change to [facility] cardiology; 95% Ramus -> PCI Resolute Onyx DES 2.75 x 18   Central cord syndrome (HCC) 12/17/2017   Chronic combined systolic and diastolic heart failure (HCC) 10/09/2016   EF 45% and grade II diastolic after nstemi   Diet-controlled diabetes mellitus (HCC)    DJD (degenerative joint disease)    Gout    High cholesterol    Hypertension    NSTEMI (non-ST elevated myocardial infarction) (HCC) 09/20/2016   95% Ramus - > PCI    Obesity     Recurrent pulmonary embolism (HCC) 11/'14; 4/'19   a) Bilateral segmental and subsegmental pulmonary emboli with mild RV Strain.; b) after fall with C-spine Fxr --> Acute PE of right main pulmonary artery extending into multiple segments.    Past Surgical History:  Procedure Laterality Date   CARDIAC CATHETERIZATION N/A 09/19/2016   Procedure: Left Heart Cath and Coronary Angiography;  Surgeon: Orpah Cobb, MD;  Location: [facility] INVASIVE CV LAB;  Service: Cardiovascular: 95% proximal Ramus Intermedius --> PCI   CARDIAC CATHETERIZATION N/A 09/19/2016   Procedure: Coronary Stent Intervention;  Surgeon: Yvonne Kendall, MD;  Location: [facility] INVASIVE CV LAB;  Service: Cardiovascular: 95% ramus intermedius;  Resolute Onyx 2.75 x 18 mm drug-eluting stent   CYSTOSCOPY/RETROGRADE/URETEROSCOPY/STONE EXTRACTION WITH BASKET  2725,3664   ureteral stone    MENISCUS REPAIR Left    knee open meniscetomy   TRANSTHORACIC ECHOCARDIOGRAM  2004   no lvh nl ejection fraction   TRANSTHORACIC ECHOCARDIOGRAM  09/19/2016   In setting of NSTEMI:  EF 45-50% with diffuse hypokinesis. GR 2 DD. Mild biatrial enlargement.    Current Medications: Current Meds  Medication Sig   acetaminophen (TYLENOL) 650 MG CR tablet Take 650-1,300 mg by mouth every 8 (eight) hours as needed for pain.   ammonium lactate (AMLACTIN) 12 % lotion Apply 1 application topically as needed for dry skin.   aspirin EC 81 MG tablet Take 1 tablet (81 mg  total) by mouth daily. (Patient taking differently: Take 81 mg by mouth in the morning.)   atorvastatin (LIPITOR) 80 MG tablet TAKE 1 TABLET BY MOUTH DAILY AT 6 PM.   Chromium Picolinate (CHROMIUM PICOLATE PO) Take 1 tablet by mouth 3 (three) times a week.   diclofenac sodium (VOLTAREN) 1 % GEL Apply 1 application topically 3 (three) times daily. Both knees (Patient taking differently: Apply 1 application  topically 3 (three) times daily as needed (knee pain).)   Multiple Vitamin (MULTIVITAMIN WITH  MINERALS) TABS tablet Take 1 tablet by mouth daily.   Potassium Citrate 15 MEQ (1620 MG) TBCR Take 1 tablet by mouth 2 (two) times daily.   terazosin (HYTRIN) 10 MG capsule TAKE 1 CAPSULE BY MOUTH AT BEDTIME.   tiZANidine (ZANAFLEX) 2 MG tablet Take 1 tablet (2 mg total) by mouth 3 (three) times daily as needed for muscle spasms (do not drive for 6 hours after taking).   vitamin C (ASCORBIC ACID) 500 MG tablet Take 500 mg by mouth daily in the afternoon.   [DISCONTINUED] metoprolol tartrate (LOPRESSOR) 50 MG tablet TAKE 1 TABLET BY MOUTH TWICE A DAY   [DISCONTINUED] XARELTO 20 MG TABS tablet TAKE 1 TABLET BY MOUTH EVERY DAY WITH SUPPER     Allergies:   Ace inhibitors and Other   Social History   Socioeconomic History   Marital status: Married    Spouse name: Not on file   Number of children: Not on file   Years of education: Not on file   Highest education level: Not on file  Occupational History   Occupation: teacher  Tobacco Use   Smoking status: Former    Packs/day: 1.00    Years: 13.00    Total pack years: 13.00    Types: Cigarettes    Quit date: 07/13/1977    Years since quitting: 44.7   Smokeless tobacco: Never  Vaping Use   Vaping Use: Never used  Substance and Sexual Activity   Alcohol use: Yes    Alcohol/week: 0.0 standard drinks of alcohol    Comment: holidays only   Drug use: No   Sexual activity: Not on file  Other Topics Concern   Not on file  Social History Narrative   Widowed 02/2020 after over 40 years married.  He has 3 children and 5 grandchildren. They also 5 great grandchildren.       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   Social Determinants of Health   Financial Resource Strain: Low Risk  (06/20/2021)   Overall Financial Resource Strain (CARDIA)    Difficulty of Paying Living Expenses: Not hard at all  Food Insecurity: No Food Insecurity (06/20/2021)   Hunger Vital Sign    Worried About Running Out of  Food in the Last Year: Never true    Ran Out of Food in the Last Year: Never true  Transportation Needs: No Transportation Needs (06/20/2021)   PRAPARE - Administrator, Civil Service (Medical): No    Lack of Transportation (Non-Medical): No  Physical Activity: Inactive (06/20/2021)   Exercise Vital Sign    Days of Exercise per Week: 0 days    Minutes of Exercise per Session: 0 min  Stress: No Stress Concern Present (06/20/2021)   [employer] of Occupational Health - Occupational Stress Questionnaire    Feeling of Stress : Not at all  Social Connections: Moderately Isolated (06/20/2021)   Social Connection and Isolation Panel [NHANES]  Frequency of Communication with Friends and Family: More than three times a week    Frequency of Social Gatherings with Friends and Family: More than three times a week    Attends Religious Services: More than 4 times per year    Active Member of [employer] or Organizations: No    Attends Banker Meetings: Never    Marital Status: Widowed     Family History: The patient's family history includes Liver disease in his mother.  ROS:   Please see the history of present illness.     All other systems reviewed and are negative.  EKGs/Labs/Other Studies Reviewed:    The following studies were reviewed today:  Echo 09/19/2016 LV EF: 45% -   50%   -------------------------------------------------------------------  Indications:      CHF - 428.0.   -------------------------------------------------------------------  History:   Risk factors:  Morbidly obese. Hypertension.   -------------------------------------------------------------------  Study Conclusions   - Left ventricle: The cavity size was mildly dilated. There was    mild concentric hypertrophy. Systolic function was mildly    reduced. The estimated ejection fraction was in the range of 45%    to 50%. Diffuse hypokinesis. Features are consistent with a     pseudonormal left ventricular filling pattern, with concomitant    abnormal relaxation and increased filling pressure (grade 2    diastolic dysfunction). Acoustic contrast opacification revealed    no evidence ofthrombus.  - Aortic valve: Valve area (VTI): 7.1 cm^2. Valve area (Vmax): 5.97    cm^2. Valve area (Vmean): 6.2 cm^2.  - Left atrium: The atrium was mildly dilated.  - Right atrium: The atrium was mildly dilated.  - Pulmonary arteries: Systolic pressure was mildly increased. PA    peak pressure: 35 mm Hg (S).   EKG:  EKG is ordered today.  The ekg ordered today demonstrates normal sinus rhythm, left bundle branch block.  Recent Labs: 07/04/2021: Hemoglobin 13.3; Platelets 209.0 10/18/2021: ALT 18; BUN 17; Creat 1.27; Potassium 4.8; Sodium 135  Recent Lipid Panel    Component Value Date/Time   CHOL 105 07/04/2021 1506   TRIG 65.0 07/04/2021 1506   HDL 35.90 (L) 07/04/2021 1506   CHOLHDL 3 07/04/2021 1506   VLDL 13.0 07/04/2021 1506   LDLCALC 56 07/04/2021 1506   LDLDIRECT 72.0 10/09/2016 1540     Risk Assessment/Calculations:           Physical Exam:    VS:  BP 134/70 (BP Location: Left Arm, Patient Position: Sitting, Cuff Size: Large)   Pulse 71   Ht 6' (1.829 m)   Wt (!) 390 lb (176.9 kg) Comment: Patient refused to weigh in office  BMI 52.89 kg/m        Wt Readings from Last 3 Encounters:  04/14/22 (!) 390 lb (176.9 kg)  03/11/22 (!) 390 lb (176.9 kg)  10/18/21 (!) 398 lb (180.5 kg)     GEN:  Well nourished, well developed in no acute distress HEENT: Normal NECK: No JVD; No carotid bruits LYMPHATICS: No lymphadenopathy CARDIAC: RRR, no murmurs, rubs, gallops RESPIRATORY:  Clear to auscultation without rales, wheezing or rhonchi  ABDOMEN: Soft, non-tender, non-distended MUSCULOSKELETAL:  No edema; No deformity  SKIN: Warm and dry NEUROLOGIC:  Alert and oriented x 3 PSYCHIATRIC:  Normal affect   ASSESSMENT:    1. LBBB (left bundle branch block)    2. Hypertension associated with diabetes (HCC)   3. Chronic combined systolic and diastolic heart failure (HCC)   4. CAD  S/P percutaneous coronary angioplasty   5. Essential hypertension   6. Hyperlipidemia LDL goal <70   7. Recurrent pulmonary embolism (HCC)    PLAN:    In order of problems listed above:  New left bundle branch block: Obtain echocardiogram.  Denies any recent chest pain or worsening dyspnea.  No obvious anginal symptom.  Hypertension: Blood pressure stable  Chronic combined systolic and diastolic heart failure: Euvolemic on exam  CAD: Last cardiac catheterization in January 2018, patient underwent DES to ramus intermedius vessel.  No significant residual disease.  Hyperlipidemia: Continue Lipitor  Recurrent PE: On Xarelto           Medication Adjustments/Labs and Tests Ordered: Current medicines are reviewed at length with the patient today.  Concerns regarding medicines are outlined above.  Orders Placed This Encounter  Procedures   EKG 12-Lead   ECHOCARDIOGRAM COMPLETE   Meds ordered this encounter  Medications   metoprolol tartrate (LOPRESSOR) 50 MG tablet    Sig: Take 1 tablet (50 mg total) by mouth 2 (two) times daily.    Dispense:  180 tablet    Refill:  3    PATIENT NEEDS TO SCHEDULE AN OFFICE VISIT FOR FUTURE REFILLS. 3RD ATTEMPT   rivaroxaban (XARELTO) 20 MG TABS tablet    Sig: Take 1 tablet (20 mg total) by mouth daily with supper.    Dispense:  90 tablet    Refill:  3    REFILLS?    Patient Instructions  Medication Instructions:  The current medical regimen is effective;  continue present plan and medications as directed. Please refer to the Current Medication list given to you today.  REFILLED METOPROLOL AND XARELTO  *If you need a refill on your cardiac medications before your next appointment, please call your pharmacy*  Lab Work:    NONE     If you have labs (blood work) drawn today and your tests are completely normal, you  will receive your results only by:  1-MyChart Message (if you have MyChart) OR  2-A paper copy in the mail.  If you have any lab test that is abnormal or we need to change your treatment, we will call you to review the results.  Testing/Procedures:  Echocardiogram - Your physician has requested that you have an echocardiogram. Echocardiography is a painless test that uses sound waves to create images of your heart. It provides your doctor with information about the size and shape of your heart and how well your heart's chambers and valves are working. This procedure takes approximately one hour. There are no restrictions for this procedure.    Follow-Up: Your next appointment:  12 month(s) In Person with Bryan Lemma, MD  or Azalee Course, PA-C      Please call our office 2 months in advance to schedule this appointment   At [facility], you and your health needs are our priority.  As part of our continuing mission to provide you with exceptional heart care, we have created designated Provider Care Teams.  These Care Teams include your primary Cardiologist (physician) and Advanced Practice Providers (APPs -  Physician Assistants and Nurse Practitioners) who all work together to provide you with the care you need, when you need it.  Important Information About Sugar           Ramond Dial, [place]  04/16/2022 11:35 PM    [facility]

## 2022-04-14 NOTE — Patient Instructions (Signed)
Medication Instructions:  The current medical regimen is effective;  continue present plan and medications as directed. Please refer to the Current Medication list given to you today.  REFILLED METOPROLOL AND XARELTO  *If you need a refill on your cardiac medications before your next appointment, please call your pharmacy*  Lab Work:    NONE     If you have labs (blood work) drawn today and your tests are completely normal, you will receive your results only by:  1-MyChart Message (if you have MyChart) OR  2-A paper copy in the mail.  If you have any lab test that is abnormal or we need to change your treatment, we will call you to review the results.  Testing/Procedures:  Echocardiogram - Your physician has requested that you have an echocardiogram. Echocardiography is a painless test that uses sound waves to create images of your heart. It provides your doctor with information about the size and shape of your heart and how well your heart's chambers and valves are working. This procedure takes approximately one hour. There are no restrictions for this procedure.    Follow-Up: Your next appointment:  12 month(s) In Person with Bryan Lemma, MD  or Azalee Course, PA-C      Please call our office 2 months in advance to schedule this appointment   At [facility], you and your health needs are our priority.  As part of our continuing mission to provide you with exceptional heart care, we have created designated Provider Care Teams.  These Care Teams include your primary Cardiologist (physician) and Advanced Practice Providers (APPs -  Physician Assistants and Nurse Practitioners) who all work together to provide you with the care you need, when you need it.  Important Information About Sugar

## 2022-04-21 ENCOUNTER — Encounter: Payer: Self-pay | Admitting: Family Medicine

## 2022-04-22 ENCOUNTER — Ambulatory Visit: Payer: Medicare PPO | Admitting: Family Medicine

## 2022-04-22 ENCOUNTER — Encounter: Payer: Self-pay | Admitting: Family Medicine

## 2022-04-22 VITALS — BP 128/68 | HR 72 | Temp 97.9°F | Ht 72.0 in | Wt 390.0 lb

## 2022-04-22 DIAGNOSIS — G8929 Other chronic pain: Secondary | ICD-10-CM

## 2022-04-22 DIAGNOSIS — M545 Low back pain, unspecified: Secondary | ICD-10-CM

## 2022-04-22 DIAGNOSIS — R3129 Other microscopic hematuria: Secondary | ICD-10-CM

## 2022-04-22 DIAGNOSIS — E119 Type 2 diabetes mellitus without complications: Secondary | ICD-10-CM

## 2022-04-22 MED ORDER — TIZANIDINE HCL 2 MG PO CAPS: 2.0000 mg | ORAL_CAPSULE | Freq: Three times a day (TID) | ORAL | 2 refills | Status: DC | PRN

## 2022-04-22 NOTE — Progress Notes (Signed)
Phone [phone] In person visit   Subjective:   Franklin Hicks is a 71 y.o. year old very pleasant male patient who presents for/with See problem oriented charting Chief Complaint  Patient presents with   Spasms    Pt c/o muscle spasms when he gets up from the bed in his lower back that's getting worse and gets sometimes while sitting or sneezing,   Past Medical History-  Patient Active Problem List   Diagnosis Date Noted   Recurrent pulmonary embolism (HCC)     Priority: High   C4 spinal cord injury, sequela (HCC) 12/18/2017    Priority: High   Tetraplegia (HCC) 12/18/2017    Priority: High   Aortic aneurysm (HCC) 12/16/2017    Priority: High   Cord compression (HCC) 12/13/2017    Priority: High   Chronic combined systolic and diastolic heart failure (HCC) 10/09/2016    Priority: High   CAD S/P percutaneous coronary angioplasty 09/20/2016    Priority: High   Morbid obesity (HCC) 07/18/2013    Priority: High   Diabetes mellitus type 2, controlled (HCC) 07/17/2010    Priority: High   Bilateral lower extremity edema 12/08/2008    Priority: High   Angioedema of lips 07/13/2013    Priority: Medium    BPH with urinary obstruction 07/17/2010    Priority: Medium    Gout 03/15/2007    Priority: Medium    Essential hypertension 03/15/2007    Priority: Medium    Pain of left heel     Priority: Low   Primary osteoarthritis of knees, bilateral 01/11/2016    Priority: Low   Erectile dysfunction 07/30/2015    Priority: Low   Multinodular goiter 07/13/2013    Priority: Low   SOB (shortness of breath) 10/02/2011    Priority: Low   NEUROPATHY, IDIOPATHIC PERIPHERAL NEC 05/31/2007    Priority: Low   Osteoarthritis 03/22/2007    Priority: Low   Preoperative cardiovascular examination 02/05/2021    Medications- reviewed and updated Current Outpatient Medications  Medication Sig Dispense Refill   acetaminophen (TYLENOL) 650 MG CR tablet Take 650-1,300 mg by mouth every 8  (eight) hours as needed for pain.     ammonium lactate (AMLACTIN) 12 % lotion Apply 1 application topically as needed for dry skin. 400 g 0   aspirin EC 81 MG tablet Take 1 tablet (81 mg total) by mouth daily. (Patient taking differently: Take 81 mg by mouth in the morning.) 90 tablet 3   atorvastatin (LIPITOR) 80 MG tablet TAKE 1 TABLET BY MOUTH DAILY AT 6 PM. 90 tablet 3   Chromium Picolinate (CHROMIUM PICOLATE PO) Take 1 tablet by mouth 3 (three) times a week.     diclofenac sodium (VOLTAREN) 1 % GEL Apply 1 application topically 3 (three) times daily. Both knees (Patient taking differently: Apply 1 application  topically 3 (three) times daily as needed (knee pain).) 3 Tube 4   fluticasone (FLONASE) 50 MCG/ACT nasal spray SPRAY 2 SPRAYS INTO EACH NOSTRIL EVERY DAY 48 mL 1   metFORMIN (GLUCOPHAGE) 500 MG tablet Take 1 tablet (500 mg total) by mouth daily with breakfast. 90 tablet 3   metoprolol tartrate (LOPRESSOR) 50 MG tablet Take 1 tablet (50 mg total) by mouth 2 (two) times daily. 180 tablet 3   Multiple Vitamin (MULTIVITAMIN WITH MINERALS) TABS tablet Take 1 tablet by mouth daily.     Potassium Citrate 15 MEQ (1620 MG) TBCR Take 1 tablet by mouth 2 (two) times daily.  11  rivaroxaban (XARELTO) 20 MG TABS tablet Take 1 tablet (20 mg total) by mouth daily with supper. 90 tablet 3   terazosin (HYTRIN) 10 MG capsule TAKE 1 CAPSULE BY MOUTH AT BEDTIME. 90 capsule 3   tizanidine (ZANAFLEX) 2 MG capsule Take 1 capsule (2 mg total) by mouth 3 (three) times daily as needed for muscle spasms (low back. failed trial of tablet- has done better in past with capsule.). 30 capsule 2   vitamin C (ASCORBIC ACID) 500 MG tablet Take 500 mg by mouth daily in the afternoon.     No current facility-administered medications for this visit.     Objective:  BP 128/68   Pulse 72   Temp 97.9 F (36.6 C)   Ht 6' (1.829 m)   Wt (!) 390 lb (176.9 kg)   BMI 52.89 kg/m  Gen: NAD, resting comfortably CV: RRR no  murmurs rubs or gallops Lungs: CTAB no crackles, wheeze, rhonchi Abdomen: soft/nontender/nondistended/normal bowel sounds. No rebound or guarding.  Ext: no edema Skin: warm, dry Baseline neurological status- wheelchair bound.  Leans forward in chair- no midline pain- significant pain with low back Paraspinous muscle palpation on right low back. No cva tenderness    Assessment and Plan   #Bilateral low back pain worse on the right S: Patient was seen in July for bilateral low back pain without sciatica-we suspected underlying degenerative disc disease with recent flare and associated muscle spasms.  We placed him on a trial of tizanidine 2 mg  Of note during that work-up had a urinalysis with trace leukocytes-subsequent urine microscopic showed many bacteria and several white blood cells but final urine culture showed mixed genital flora-no clear UTI.  He also had 3-6 red blood cells per high-power field-we ultimately placed a referral to urology under microscopic hematuria -We also discussed updating lumbar spine films if pain persisted more than 4 to 6 weeks  Today patient reports tizanidine was very helpful at first. Sneezed a few times a week or two later and pain has come and gone several times since then but tizinidine would help when used again. He took a trip down to the beach with grandkids- when came back from the trip  (was ok on trip) within last week or two seems to intensify when rising from the bed- burning flare of pain that goes across low back. If already in his chair can get up from there and is ok for the most part- though yesterday did have some pain when walking with his walker- tiger balm helped some last night. Today has had a few spasms but not as bad as yesterday- has tried the medicine and not as helpful as was previously   -In regards to urology referral- was placed but he has not been called  A/P: Patient with recurrent bilateral low back pain worse on the right side.   Denies worsening incontinence, saddle anesthesia, worsening leg weakness-no obvious red flags.  - tizanidine capsules have worked better in past- will try to get these covered (good rx may be option as well)  - most of pain is getting out of bed and rather severe- will see if PT/OT can help with home health (uses walker in home and wheelchair in office) -declines x-ray for now though I strongly recommended with druation of pain - avoid nsaids with xarelto- avoid prednisone with a1c and no radicular pain -very strongly considering ortho referral if fails to improve- he can call in about this- they may have more  support for him to get x-ray- he is concerned about getting onto table  Also trying to get info on referral to urology- team to investigate. Get another urine culture and urinalysis today - also check CBC/CMP to evaluate renal function  # Diabetes S:  controlled on no rx previously but #s have increased  and later started on metformin at 500mg  daily Lab Results  Component Value Date   HGBA1C 7.9 (H) 10/18/2021   HGBA1C 8.6 (H) 07/04/2021   HGBA1C 7.1 (H) 09/21/2020  A/P: poor control previously- strongly recommended a1c but he declines until follow up  - continue current medicaiton for now  Recommended follow up: Return for as needed for new, worsening, persistent symptoms. Future Appointments  Date Time Provider Department Center  04/23/2022 11:20 AM [facility] 2 [id number]  07/01/2022  3:00 PM 13/02/2022, MD [facility]  07/07/2022 10:15 AM [facility] HEALTH COACH [facility]    Lab/Order associations:   ICD-10-CM   1. Chronic bilateral low back pain without sciatica  M54.50 CBC with Differential/Platelet   G89.29 Comprehensive metabolic panel    Urine Culture    Urinalysis, Routine w reflex microscopic    Ambulatory referral to Home Health    2. Microscopic hematuria  R31.29 Urine Culture    Urinalysis, Routine w reflex microscopic    3.  Controlled type 2 diabetes mellitus without complication, without long-term current use of insulin (HCC)  E11.9       Meds ordered this encounter  Medications   tizanidine (ZANAFLEX) 2 MG capsule    Sig: Take 1 capsule (2 mg total) by mouth 3 (three) times daily as needed for muscle spasms (low back. failed trial of tablet- has done better in past with capsule.).    Dispense:  30 capsule    Refill:  2    Return precautions advised.  07/09/2022, MD

## 2022-04-22 NOTE — Patient Instructions (Addendum)
Flu shot (high dose)- we should have these available within a month or two but please let us know if you get at outside pharmacy  Please stop by lab before you go If you have mychart- we will send your results within 3 business days of [place] receiving them.  If you do not have mychart- we will call you about results within 5 business days of [place] receiving them.  *please also note that you will see labs on mychart as soon as they post. I will later go in and write notes on them- will say "notes from Dr. Durene Cal"   Trial capsule instead of tablet- we can submit prior auth if needed- I wrote in the notes that you failed the tablet version -could also try tylenol to take edge off of pain  I really think we need x-rays- one option if we dont get better relief with this would be to do a formal referral to orthopedics  Team is researching referral.   We will call you within two weeks about your referral to home health for physical and occupational therapy- to help with positioning/transfers/strengthening. If you do not hear within 2 weeks, give [place] a call.

## 2022-04-23 ENCOUNTER — Ambulatory Visit (HOSPITAL_COMMUNITY): Payer: Medicare PPO | Attending: Cardiology

## 2022-04-23 DIAGNOSIS — I152 Hypertension secondary to endocrine disorders: Secondary | ICD-10-CM | POA: Diagnosis not present

## 2022-04-23 DIAGNOSIS — Z9861 Coronary angioplasty status: Secondary | ICD-10-CM | POA: Insufficient documentation

## 2022-04-23 DIAGNOSIS — E1159 Type 2 diabetes mellitus with other circulatory complications: Secondary | ICD-10-CM | POA: Insufficient documentation

## 2022-04-23 DIAGNOSIS — I5042 Chronic combined systolic (congestive) and diastolic (congestive) heart failure: Secondary | ICD-10-CM | POA: Diagnosis not present

## 2022-04-23 DIAGNOSIS — I2510 Atherosclerotic heart disease of native coronary artery without angina pectoris: Secondary | ICD-10-CM | POA: Diagnosis not present

## 2022-04-23 LAB — URINALYSIS, ROUTINE W REFLEX MICROSCOPIC
Bilirubin Urine: NEGATIVE
Hgb urine dipstick: NEGATIVE
Ketones, ur: NEGATIVE
Leukocytes,Ua: NEGATIVE
Nitrite: NEGATIVE
Specific Gravity, Urine: 1.015 (ref 1.000–1.030)
Total Protein, Urine: NEGATIVE
Urine Glucose: NEGATIVE
Urobilinogen, UA: 0.2 (ref 0.0–1.0)
pH: 6.5 (ref 5.0–8.0)

## 2022-04-23 LAB — CBC WITH DIFFERENTIAL/PLATELET
Basophils Absolute: 0 10*3/uL (ref 0.0–0.1)
Basophils Relative: 0.5 % (ref 0.0–3.0)
Eosinophils Absolute: 0.2 10*3/uL (ref 0.0–0.7)
Eosinophils Relative: 2 % (ref 0.0–5.0)
HCT: 38.6 % — ABNORMAL LOW (ref 39.0–52.0)
Hemoglobin: 12.8 g/dL — ABNORMAL LOW (ref 13.0–17.0)
Lymphocytes Relative: 16.9 % (ref 12.0–46.0)
Lymphs Abs: 1.4 10*3/uL (ref 0.7–4.0)
MCHC: 33.2 g/dL (ref 30.0–36.0)
MCV: 90.4 fl (ref 78.0–100.0)
Monocytes Absolute: 0.6 10*3/uL (ref 0.1–1.0)
Monocytes Relative: 7.7 % (ref 3.0–12.0)
Neutro Abs: 6.2 10*3/uL (ref 1.4–7.7)
Neutrophils Relative %: 72.9 % (ref 43.0–77.0)
Platelets: 236 10*3/uL (ref 150.0–400.0)
RBC: 4.27 Mil/uL (ref 4.22–5.81)
RDW: 14.1 % (ref 11.5–15.5)
WBC: 8.5 10*3/uL (ref 4.0–10.5)

## 2022-04-23 LAB — COMPREHENSIVE METABOLIC PANEL
ALT: 19 U/L (ref 0–53)
AST: 19 U/L (ref 0–37)
Albumin: 3.4 g/dL — ABNORMAL LOW (ref 3.5–5.2)
Alkaline Phosphatase: 100 U/L (ref 39–117)
BUN: 23 mg/dL (ref 6–23)
CO2: 26 mEq/L (ref 19–32)
Calcium: 8.9 mg/dL (ref 8.4–10.5)
Chloride: 100 mEq/L (ref 96–112)
Creatinine, Ser: 1.21 mg/dL (ref 0.40–1.50)
GFR: 59.69 mL/min — ABNORMAL LOW (ref >60.00)
Glucose, Bld: 120 mg/dL — ABNORMAL HIGH (ref 70–99)
Potassium: 4.4 mEq/L (ref 3.5–5.1)
Sodium: 134 mEq/L — ABNORMAL LOW (ref 135–145)
Total Bilirubin: 0.4 mg/dL (ref 0.2–1.2)
Total Protein: 7.5 g/dL (ref 6.0–8.3)

## 2022-04-23 LAB — ECHOCARDIOGRAM COMPLETE
Area-P 1/2: 4.39 cm2
S' Lateral: 4.7 cm

## 2022-04-23 MED ORDER — PERFLUTREN LIPID MICROSPHERE
1.0000 mL | INTRAVENOUS | Status: AC | PRN
  Administered 2022-04-23: 1 mL via INTRAVENOUS

## 2022-04-24 ENCOUNTER — Telehealth: Payer: Self-pay | Admitting: Family Medicine

## 2022-04-24 DIAGNOSIS — M545 Low back pain, unspecified: Secondary | ICD-10-CM

## 2022-04-24 LAB — URINE CULTURE
MICRO NUMBER:: 13846853
SPECIMEN QUALITY:: ADEQUATE

## 2022-04-24 NOTE — Telephone Encounter (Signed)
Per [name] at [phone] Mr Franklin Hicks is no longer eligible for Home Health PT due to the fact that patient is driving. Please add physical therapy referral for patient to come in offce for therapy.

## 2022-04-24 NOTE — Telephone Encounter (Signed)
Referral has been placed. 

## 2022-05-05 NOTE — Therapy (Signed)
OUTPATIENT PHYSICAL THERAPY THORACOLUMBAR EVALUATION   Patient Name: Franklin Hicks MRN: [id number] DOB:1951-06-19, 71 y.o., male Today's Date: 05/08/2022    Past Medical History:  Diagnosis Date   Arthritis    "knees" (12/17/2017)   CAD S/P percutaneous coronary angioplasty 09/20/2016   Nstemi 08/2016. Dr. Algie Coffer. DES- brilinta and asa. Requests change to [facility] cardiology; 95% Ramus -> PCI Resolute Onyx DES 2.75 x 18   Central cord syndrome (HCC) 12/17/2017   Chronic combined systolic and diastolic heart failure (HCC) 10/09/2016   EF 45% and grade II diastolic after nstemi   Diet-controlled diabetes mellitus (HCC)    DJD (degenerative joint disease)    Gout    High cholesterol    Hypertension    NSTEMI (non-ST elevated myocardial infarction) (HCC) 09/20/2016   95% Ramus - > PCI    Obesity    Recurrent pulmonary embolism (HCC) 11/'14; 4/'19   a) Bilateral segmental and subsegmental pulmonary emboli with mild RV Strain.; b) after fall with C-spine Fxr --> Acute PE of right main pulmonary artery extending into multiple segments.   Past Surgical History:  Procedure Laterality Date   CARDIAC CATHETERIZATION N/A 09/19/2016   Procedure: Left Heart Cath and Coronary Angiography;  Surgeon: Orpah Cobb, MD;  Location: [facility];  Service: Cardiovascular: 95% proximal Ramus Intermedius --> PCI   CARDIAC CATHETERIZATION N/A 09/19/2016   Procedure: Coronary Stent Intervention;  Surgeon: Yvonne Kendall, MD;  Location: [facility] INVASIVE CV LAB;  Service: Cardiovascular: 95% ramus intermedius;  Resolute Onyx 2.75 x 18 mm drug-eluting stent   CYSTOSCOPY/RETROGRADE/URETEROSCOPY/STONE EXTRACTION WITH BASKET  8185,6314   ureteral stone    MENISCUS REPAIR Left    knee open meniscetomy   TRANSTHORACIC ECHOCARDIOGRAM  2004   no lvh nl ejection fraction   TRANSTHORACIC ECHOCARDIOGRAM  09/19/2016   In setting of NSTEMI:  EF 45-50% with diffuse hypokinesis. GR 2 DD. Mild biatrial enlargement.   Patient  Active Problem List   Diagnosis Date Noted   Preoperative cardiovascular examination 02/05/2021   Recurrent pulmonary embolism (HCC)    Pain of left heel    C4 spinal cord injury, sequela (HCC) 12/18/2017   Tetraplegia (HCC) 12/18/2017   Aortic aneurysm (HCC) 12/16/2017   Cord compression (HCC) 12/13/2017   Chronic combined systolic and diastolic heart failure (HCC) 10/09/2016   CAD S/P percutaneous coronary angioplasty 09/20/2016   Primary osteoarthritis of knees, bilateral 01/11/2016   Erectile dysfunction 07/30/2015   Morbid obesity (HCC) 07/18/2013   Angioedema of lips 07/13/2013   Multinodular goiter 07/13/2013   SOB (shortness of breath) 10/02/2011   BPH with urinary obstruction 07/17/2010   Diabetes mellitus type 2, controlled (HCC) 07/17/2010   Bilateral lower extremity edema 12/08/2008   NEUROPATHY, IDIOPATHIC PERIPHERAL NEC 05/31/2007   Osteoarthritis 03/22/2007   Gout 03/15/2007   Essential hypertension 03/15/2007    PCP: Shelva Majestic, MD  REFERRING PROVIDER: Shelva Majestic, MD  REFERRING DIAG: Chronic bilateral low back pain without sciatica [M54.50, G89.29]  Rationale for Evaluation and Treatment Rehabilitation  THERAPY DIAG:  Difficulty in walking, not elsewhere classified  Muscle weakness (generalized)  Other low back pain  Other lack of coordination  Other abnormalities of gait and mobility  ONSET DATE: 6 weeks ago.   SUBJECTIVE:  SUBJECTIVE STATEMENT: Patient states that about 6 weeks ago he started having muscle spasms without any cause. He reports spasms lasting several minutes, and presenting while seated. He reports being prescribed medication for muscle spasms which has helped. He now notes spasms most notable with transfers, especially when getting in/  out of bed. Pt denies any N/T into bilat LE's. He presents to PT in a [facility], he has been using it for community ambulation for 3 years. Pt reports using a 2WW for short distances.  PERTINENT HISTORY:  Arthritis, DM, DJD  PAIN:  Are you having pain? Yes: NPRS scale: getting out of bed: 4/10, currently 2/10 Pain location: LBP Pain description: Spasms, sharp pain that turns into a dull ache.  Aggravating factors: Transfers, leaning forward Relieving factors: Medication, heat, tiger balm    PRECAUTIONS: None  WEIGHT BEARING RESTRICTIONS No  FALLS:  Has patient fallen in last 6 months? No  LIVING ENVIRONMENT: Lives with: lives with their family Lives in: House/apartment Stairs: Yes: Internal: 12 steps; on right going up and External: 6 steps; bilateral but cannot reach both Has following equipment at home: Dan Humphreys - 2 wheeled, Wheelchair (manual), [employer], Grab bars, and Ramped entry  OCCUPATION: Retired   PLOF: Independent  PATIENT GOALS PT would like to be able to reduce muscle spasms.    OBJECTIVE:   DIAGNOSTIC FINDINGS:  None  COGNITION:  Overall cognitive status: Within functional limits for tasks assessed     SENSATION: WFL   POSTURE: decreased lumbar lordosis and anterior pelvic tilt  PALPATION: Bilat QL  LUMBAR ROM:   Active  A/PROM  eval  Flexion 25%   Extension Pt unable to get to neutral   Right lateral flexion 75%   Left lateral flexion WFL  Right rotation 75%   Left rotation WFL   (Blank rows = not tested)  LOWER EXTREMITY ROM:     Active  Right eval Left eval  Hip flexion WFL with soft tissue restrictions.  WFL with soft tissue restrictions.  Knee flexion 88 in WC, pt states he is unable to go any further.  72 degrees in WC, pt reports restrictions due to past injuries.   Knee extension Lacking 15 degrees in WC Lacking 40 degrees in WC   (Blank rows = not tested)  LOWER EXTREMITY MMT:    MMT Right eval Left eval  Hip flexion 5 5   Hip abduction    Knee flexion 5 5  Knee extension 4 2+   (Blank rows = not tested)  LUMBAR SPECIAL TESTS:  Unable to assess due to weight restrictions on table at current clinic.   FUNCTIONAL TESTS:  None on eval due to time restrains. Assess next session.   GAIT: Distance walked: Self propelled WC 10ft.  Assistive device utilized: Wheelchair (manual) Level of assistance: Complete Independence Comments: Pt self propels in WC, with R LE and Bilat UE's. Upon standing he was unable to stand up straight, but reports FWW being too narrow.     TODAY'S TREATMENT  Evaluation only    PATIENT EDUCATION:  Education details: Educated pt on anatomy and physiology of current symptoms, diagnosis, prognosis, HEP,  and POC. Person educated: Patient Education method: Medical illustrator Education comprehension: verbalized understanding and returned demonstration   HOME EXERCISE PROGRAM: None today.    ASSESSMENT:  CLINICAL IMPRESSION: Patient referred to PT for chronic lower back pain. He demonstrates decreased lumbar ROM, and has decreased functional ROM and strength in L LE. Pt states  that he has a previous injury in his L LE that has caused limitations with functional activities. Pt only able to stand at [facility] for a few seconds while assessing lumber ROM prior to needing to be seated. He states that he does most of his community ambulation in his WC and only propels himself with his R LE and bilat UE's. Patient will benefit from skilled PT to address below impairments, limitations and improve overall function.  OBJECTIVE IMPAIRMENTS: decreased activity tolerance, difficulty walking, decreased balance, decreased endurance, decreased mobility, decreased ROM, decreased strength, impaired flexibility, impaired LE use, postural dysfunction, and pain.  ACTIVITY LIMITATIONS: bending, lifting, carry, locomotion, cleaning, community activity, driving, and or occupation  PERSONAL FACTORS:  Arthritis, DM, DJD are also affecting patient's functional outcome.  REHAB POTENTIAL: Good  CLINICAL DECISION MAKING: Stable/uncomplicated  EVALUATION COMPLEXITY: Low    GOALS: Short term PT Goals Target date: 06/05/2022 Pt will be I and compliant with HEP. Baseline:  Goal status: New  Long term PT goals Target date: 07/03/2022 Pt will improve ROM to [facility] to improve functional mobility Baseline: Goal status: New Pt will improve hip/knee strength to at least 4+/5 MMT to improve functional strength Baseline: Goal status: New Pt will reduce pain by overall 50% overall with usual activity Baseline: Goal status: New Pt will reduce pain to overall less than 2-3/10 with transfers. Baseline: Goal status: New Pt will be able to ambulate community distances at least 150 ft with FWW without pain.  Baseline: Goal status: New  PLAN: PT FREQUENCY: 1 times per week   PT DURATION: 6-8 weeks  PLANNED INTERVENTIONS (unless contraindicated): aquatic PT, Canalith repositioning, cryotherapy, Electrical stimulation, Iontophoresis with 4 mg/ml dexamethasome, Moist heat, traction, Ultrasound, gait training, Therapeutic exercise, balance training, neuromuscular re-education, patient/family education, prosthetic training, manual techniques, passive ROM, dry needling, taping, vasopnuematic device, vestibular, spinal manipulations, joint manipulations  PLAN FOR NEXT SESSION: Assess HEP/update PRN, continue to progress functional mobility, strengthen core, proximal hip muscles. Decrease patients pain and help minimize functional deficits, improve gait and balance.     Champ Mungo, PT 05/08/2022, 11:58 AM  Referring diagnosis? Chronic bilateral low back pain without sciatica [M54.50, G89.29] Treatment diagnosis? (if different than referring diagnosis)  Difficulty in walking, not elsewhere classified  Muscle weakness (generalized)  Other low back pain  Other lack of coordination  Other  abnormalities of gait and mobility What was this (referring dx) caused by? []  Surgery []  Fall [x]  Ongoing issue []  Arthritis []  Other: ____________  Laterality: []  Rt []  Lt [x]  Both  Check all possible CPT codes:  *CHOOSE 10 OR LESS*    [x]  97110 (Therapeutic Exercise)  []  92507 (SLP Treatment)  [x]  97112 (Neuro Re-ed)   []  92526 (Swallowing Treatment)   [x]  97116 (Gait Training)   []  (Cognitive Training, 1st 15 minutes) [x]  97140 (Manual Therapy)   []  97130 (Cognitive Training, each add'l 15 minutes)  []  97164 (Re-evaluation)                              []  Other, List CPT Code ____________  [x]  97530 (Therapeutic Activities)     [x]  97535 (Self Care)   []  All codes above (97110 - 97535)  [x]  97012 (Mechanical Traction)  [x]  97014 (E-stim Unattended)  []  97032 (E-stim manual)  [x]  97033 (Ionto)  [x]  97035 (Ultrasound) []  97750 (Physical Performance Training) [x]  (Aquatic Therapy) [x]  97016 (Vasopneumatic Device) []  (Paraffin) []   09811 (Contrast Bath) []  [phone] (Wound Care 1st 20 sq cm) []  97598 (Wound Care each add'l 20 sq cm) []  97760 (Orthotic Fabrication, Fitting, Training Initial) []  91478 (Prosthetic Management and Training Initial) []  (Orthotic or Prosthetic Training/ Modification Subsequent)

## 2022-05-08 ENCOUNTER — Ambulatory Visit (INDEPENDENT_AMBULATORY_CARE_PROVIDER_SITE_OTHER): Payer: Medicare PPO | Admitting: Physical Therapy

## 2022-05-08 ENCOUNTER — Telehealth: Payer: Self-pay | Admitting: Family Medicine

## 2022-05-08 ENCOUNTER — Other Ambulatory Visit: Payer: Self-pay

## 2022-05-08 DIAGNOSIS — R2689 Other abnormalities of gait and mobility: Secondary | ICD-10-CM | POA: Diagnosis not present

## 2022-05-08 DIAGNOSIS — M5459 Other low back pain: Secondary | ICD-10-CM

## 2022-05-08 DIAGNOSIS — R278 Other lack of coordination: Secondary | ICD-10-CM

## 2022-05-08 DIAGNOSIS — R262 Difficulty in walking, not elsewhere classified: Secondary | ICD-10-CM

## 2022-05-08 DIAGNOSIS — M6281 Muscle weakness (generalized): Secondary | ICD-10-CM | POA: Diagnosis not present

## 2022-05-08 NOTE — Telephone Encounter (Signed)
Pt states: -physical therapist seen today 09/14 -physical therapist is sending "something over to" another facility -He cannot schedule an appointment until that is done.   Pt acknowledged physical therapist has been with patients all day. Pt acknowledged that sending referrals and similar "things" to other locations can take a few days, but he assumed it would be immediate.   Pt requests: -Call for an update.

## 2022-05-08 NOTE — Telephone Encounter (Signed)
Emailed Gaylyn Rong and [place]. He is a transfer from [facility] to [facility]. We do not have the equipment needs for him at this location. He also states that [facility] is closer to home. Please call him to get scheduled 1x per week for 4 weeks. Eval note should be completed. Thanks.

## 2022-05-09 NOTE — Telephone Encounter (Signed)
Unable to process. Please route to appropriate entity.

## 2022-05-14 NOTE — Therapy (Signed)
OUTPATIENT PHYSICAL THERAPY TREATMENT NOTE   Patient Name: Franklin SpareCarl J Kitchens MRN: [id number] DOB:10/28/1950, 71 y.o., male Today's Date: 05/15/2022  PCP: Shelva MajesticHunter, Stephen O, MD REFERRING PROVIDER: Shelva MajesticHunter, Stephen O, MD  END OF SESSION:   PT End of Session - 05/15/22 1321     Visit Number 2    Number of Visits 8    Date for PT Re-Evaluation 07/10/22    Authorization Type [employer] MEDICARE CHOICE PPO    PT Start Time 1325    PT Stop Time 1355    PT Time Calculation (min) 30 min    Activity Tolerance Patient tolerated treatment well    [facility] tasks assessed/performed             Past Medical History:  Diagnosis Date   Arthritis    "knees" (12/17/2017)   CAD S/P percutaneous coronary angioplasty 09/20/2016   Nstemi 08/2016. Dr. Algie CofferKadakia. DES- brilinta and asa. [facility]; 95% Ramus -> PCI Resolute Onyx DES 2.75 x 18   Central cord syndrome (HCC) 12/17/2017   Chronic combined systolic and diastolic heart failure (HCC) 10/09/2016   EF 45% and grade II diastolic after nstemi   Diet-controlled diabetes mellitus (HCC)    DJD (degenerative joint disease)    Gout    High cholesterol    Hypertension    NSTEMI (non-ST elevated myocardial infarction) (HCC) 09/20/2016   95% Ramus - > PCI    Obesity    Recurrent pulmonary embolism (HCC) 11/'14; 4/'19   a) Bilateral segmental and subsegmental pulmonary emboli with mild RV Strain.; b) after fall with C-spine Fxr --> Acute PE of right main pulmonary artery extending into multiple segments.   Past Surgical History:  Procedure Laterality Date   CARDIAC CATHETERIZATION N/A 09/19/2016   Procedure: Left Heart Cath and Coronary Angiography;  Surgeon: Orpah CobbAjay Kadakia, MD;  Location: [facility] LAB;  Service: Cardiovascular: 95% proximal Ramus Intermedius --> PCI   CARDIAC CATHETERIZATION N/A 09/19/2016   Procedure: Coronary Stent Intervention;  Surgeon: Yvonne Kendallhristopher End, MD;  Location: [facility] INVASIVE CV LAB;   Service: Cardiovascular: 95% ramus intermedius;  Resolute Onyx 2.75 x 18 mm drug-eluting stent   CYSTOSCOPY/RETROGRADE/URETEROSCOPY/STONE EXTRACTION WITH BASKET  [id number]   ureteral stone    MENISCUS REPAIR Left    knee open meniscetomy   TRANSTHORACIC ECHOCARDIOGRAM  2004   no lvh nl ejection fraction   TRANSTHORACIC ECHOCARDIOGRAM  09/19/2016   In setting of NSTEMI:  EF 45-50% with diffuse hypokinesis. GR 2 DD. Mild biatrial enlargement.   Patient Active Problem List   Diagnosis Date Noted   Preoperative cardiovascular examination 02/05/2021   Recurrent pulmonary embolism (HCC)    Pain of left heel    C4 spinal cord injury, sequela (HCC) 12/18/2017   Tetraplegia (HCC) 12/18/2017   Aortic aneurysm (HCC) 12/16/2017   Cord compression (HCC) 12/13/2017   Chronic combined systolic and diastolic heart failure (HCC) 10/09/2016   CAD S/P percutaneous coronary angioplasty 09/20/2016   Primary osteoarthritis of knees, bilateral 01/11/2016   Erectile dysfunction 07/30/2015   Morbid obesity (HCC) 07/18/2013   Angioedema of lips 07/13/2013   Multinodular goiter 07/13/2013   SOB (shortness of breath) 10/02/2011   BPH with urinary obstruction 07/17/2010   Diabetes mellitus type 2, controlled (HCC) 07/17/2010   Bilateral lower extremity edema 12/08/2008   NEUROPATHY, IDIOPATHIC PERIPHERAL NEC 05/31/2007   Osteoarthritis 03/22/2007   Gout 03/15/2007   Essential hypertension 03/15/2007    REFERRING DIAG: Chronic bilateral  low back pain without sciatica [M54.50, G89.29]  THERAPY DIAG: Difficulty in walking, not elsewhere classified   Muscle weakness (generalized)   Other low back pain   Other lack of coordination   Other abnormalities of gait and mobility   Rationale for Evaluation and Treatment Rehabilitation  PERTINENT HISTORY: Arthritis, DM, DJD  PRECAUTIONS: fall and transfers  SUBJECTIVE: Describes low back pain and spasms, worse with bed mobility, twisting tasks and  walking long distances  PAIN:  Are you having pain? Yes: NPRS scale: 8/10 Pain location: low back Pain description: R low back Aggravating factors: twisting  Relieving factors: medication     OBJECTIVE: (objective measures completed at initial evaluation unless otherwise dated)  DIAGNOSTIC FINDINGS:  None   COGNITION:           Overall cognitive status: Within functional limits for tasks assessed                          SENSATION: WFL     POSTURE: decreased lumbar lordosis and anterior pelvic tilt   PALPATION: Bilat QL   LUMBAR ROM:    Active  A/PROM  eval  Flexion 25%   Extension Pt unable to get to neutral   Right lateral flexion 75%   Left lateral flexion WFL  Right rotation 75%   Left rotation WFL   (Blank rows = not tested)   LOWER EXTREMITY ROM:      Active  Right eval Left eval  Hip flexion WFL with soft tissue restrictions.  WFL with soft tissue restrictions.  Knee flexion 88 in WC, pt states he is unable to go any further.  72 degrees in WC, pt reports restrictions due to past injuries.   Knee extension Lacking 15 degrees in WC Lacking 40 degrees in WC   (Blank rows = not tested)   LOWER EXTREMITY MMT:     MMT Right eval Left eval  Hip flexion 5 5  Hip abduction      Knee flexion 5 5  Knee extension 4 2+   (Blank rows = not tested)   LUMBAR SPECIAL TESTS:  Unable to assess due to weight restrictions on table at current clinic.    FUNCTIONAL TESTS:  None on eval due to time restrains. Assess next session.    GAIT: Distance walked: Self propelled WC 99ft.  Assistive device utilized: Wheelchair (manual) Level of assistance: Complete Independence Comments: Pt self propels in WC, with R LE and Bilat UE's. Upon standing he was unable to stand up straight, but reports FWW being too narrow.        TODAY'S TREATMENT  [facility] Adult PT Treatment:                                                DATE: 05/15/22 Therapeutic Exercise: Standing heel  raises 10x Forward stepping B 10/10 in walker  FAQs B 10/10 Seated SLR L RTB 10x Seated SLR circles CW/CCW RTB 10/10  Seated forward flexion RTB abdominals Seated chest press RTB unsupported sitting B Seated chest press RTB unsupported sitting, unilateral 10/10 STS transfer from [facility] and WS tasks       PATIENT EDUCATION:  Education details: Educated pt on anatomy and physiology of current symptoms, diagnosis, prognosis, HEP,  and POC. Person educated: Patient Education method: Dance movement psychotherapist  comprehension: verbalized understanding and returned demonstration     HOME EXERCISE PROGRAM: None today.      ASSESSMENT:   CLINICAL IMPRESSION: First session following initial assessment.  Patient reporting little resting pain.  Today's session focused on establishing a HEP for LE strengthening, core and lumbar stabilization tasks.  Able to stand from [facility] but was apprehensive about transfer to lower mat table for fear of not being able to get back up.  Was able to tolerate all tasks today w/o setback.  HEP issued.   OBJECTIVE IMPAIRMENTS: decreased activity tolerance, difficulty walking, decreased balance, decreased endurance, decreased mobility, decreased ROM, decreased strength, impaired flexibility, impaired LE use, postural dysfunction, and pain.   ACTIVITY LIMITATIONS: bending, lifting, carry, locomotion, cleaning, community activity, driving, and or occupation   PERSONAL FACTORS: Arthritis, DM, DJD are also affecting patient's functional outcome.   REHAB POTENTIAL: Good   CLINICAL DECISION MAKING: Stable/uncomplicated   EVALUATION COMPLEXITY: Low       GOALS: Short term PT Goals Target date: 06/05/2022 Pt will be I and compliant with HEP. Baseline:  Goal status: New   Long term PT goals Target date: 07/03/2022 Pt will improve ROM to [facility] to improve functional mobility Baseline: Goal status: New Pt will improve hip/knee strength to at least 4+/5 MMT to  improve functional strength Baseline: Goal status: New Pt will reduce pain by overall 50% overall with usual activity Baseline: Goal status: New Pt will reduce pain to overall less than 2-3/10 with transfers. Baseline: Goal status: New Pt will be able to ambulate community distances at least 150 ft with FWW without pain.  Baseline: Goal status: New   PLAN: PT FREQUENCY: 1 times per week    PT DURATION: 6-8 weeks   PLANNED INTERVENTIONS (unless contraindicated): aquatic PT, Canalith repositioning, cryotherapy, Electrical stimulation, Iontophoresis with 4 mg/ml dexamethasome, Moist heat, traction, Ultrasound, gait training, Therapeutic exercise, balance training, neuromuscular re-education, patient/family education, prosthetic training, manual techniques, passive ROM, dry needling, taping, vasopnuematic device, vestibular, spinal manipulations, joint manipulations   PLAN FOR NEXT SESSION: Assess HEP/update PRN, continue to progress functional mobility, strengthen core, proximal hip muscles. Decrease patients pain and help minimize functional deficits, improve gait and balance. Use elevating mat table      Lanice Shirts, PT 05/15/2022, 2:17 PM

## 2022-05-15 ENCOUNTER — Ambulatory Visit: Payer: Medicare PPO | Attending: Family Medicine

## 2022-05-15 DIAGNOSIS — M5459 Other low back pain: Secondary | ICD-10-CM | POA: Insufficient documentation

## 2022-05-15 DIAGNOSIS — R262 Difficulty in walking, not elsewhere classified: Secondary | ICD-10-CM | POA: Insufficient documentation

## 2022-05-15 DIAGNOSIS — M6281 Muscle weakness (generalized): Secondary | ICD-10-CM | POA: Diagnosis not present

## 2022-05-19 ENCOUNTER — Encounter: Payer: Self-pay | Admitting: *Deleted

## 2022-05-22 ENCOUNTER — Ambulatory Visit: Payer: Medicare PPO

## 2022-05-22 DIAGNOSIS — M6281 Muscle weakness (generalized): Secondary | ICD-10-CM

## 2022-05-22 DIAGNOSIS — R262 Difficulty in walking, not elsewhere classified: Secondary | ICD-10-CM | POA: Diagnosis not present

## 2022-05-22 DIAGNOSIS — M5459 Other low back pain: Secondary | ICD-10-CM

## 2022-05-22 NOTE — Therapy (Signed)
OUTPATIENT PHYSICAL THERAPY TREATMENT NOTE   Patient Name: Franklin Hicks MRN: [id number] DOB:Feb 02, 1951, 71 y.o., male Today's Date: 05/22/2022  PCP: Marin Olp, MD REFERRING PROVIDER: Marin Olp, MD  END OF SESSION:   PT End of Session - 05/22/22 1320     Visit Number 3    Number of Visits 8    Date for PT Re-Evaluation 07/10/22    Authorization Type [employer] MEDICARE CHOICE PPO    PT Start Time 1315    PT Stop Time 5956    PT Time Calculation (min) 40 min    Activity Tolerance Patient tolerated treatment well    Behavior During Therapy [facility] for tasks assessed/performed             Past Medical History:  Diagnosis Date   Arthritis    "knees" (12/17/2017)   CAD S/P percutaneous coronary angioplasty 09/20/2016   Nstemi 08/2016. Dr. Doylene Canard. DES- brilinta and asa. Requests change to [facility] cardiology; 95% Ramus -> PCI Resolute Onyx DES 2.75 x 18   Central cord syndrome ([place]) 12/17/2017   Chronic combined systolic and diastolic heart failure (HCC) 10/09/2016   EF 45% and grade II diastolic after nstemi   Diet-controlled diabetes mellitus (HCC)    DJD (degenerative joint disease)    Gout    High cholesterol    Hypertension    NSTEMI (non-ST elevated myocardial infarction) ([place]) 09/20/2016   95% Ramus - > PCI    Obesity    Recurrent pulmonary embolism ([place]) 11/'14; 4/'19   a) Bilateral segmental and subsegmental pulmonary emboli with mild RV Strain.; b) after fall with C-spine Fxr --> Acute PE of right main pulmonary artery extending into multiple segments.   Past Surgical History:  Procedure Laterality Date   CARDIAC CATHETERIZATION N/A 09/19/2016   Procedure: Left Heart Cath and Coronary Angiography;  Surgeon: Dixie Dials, MD;  Location: [place] [facility];  Service: Cardiovascular: 95% proximal Ramus Intermedius --> PCI   CARDIAC CATHETERIZATION N/A 09/19/2016   Procedure: Coronary Stent Intervention;  Surgeon: Nelva Bush, MD;  Location: [place] [facility];   Service: Cardiovascular: 95% ramus intermedius;  Resolute Onyx 2.75 x 18 mm drug-eluting stent   CYSTOSCOPY/RETROGRADE/URETEROSCOPY/STONE EXTRACTION WITH BASKET  3875,6433   ureteral stone    MENISCUS REPAIR Left    knee open meniscetomy   TRANSTHORACIC ECHOCARDIOGRAM  2004   no lvh nl ejection fraction   TRANSTHORACIC ECHOCARDIOGRAM  09/19/2016   In setting of NSTEMI:  EF 45-50% with diffuse hypokinesis. GR 2 DD. Mild biatrial enlargement.   Patient Active Problem List   Diagnosis Date Noted   Preoperative cardiovascular examination 02/05/2021   Recurrent pulmonary embolism (HCC)    Pain of left heel    C4 spinal cord injury, sequela ([place]) 12/18/2017   Tetraplegia ([place]) 12/18/2017   Aortic aneurysm ([place]) 12/16/2017   Cord compression ([place]) 12/13/2017   Chronic combined systolic and diastolic heart failure ([place]) 10/09/2016   CAD S/P percutaneous coronary angioplasty 09/20/2016   Primary osteoarthritis of knees, bilateral 01/11/2016   Erectile dysfunction 07/30/2015   Morbid obesity ([place]) 07/18/2013   Angioedema of lips 07/13/2013   Multinodular goiter 07/13/2013   SOB (shortness of breath) 10/02/2011   BPH with urinary obstruction 07/17/2010   Diabetes mellitus type 2, controlled ([place]) 07/17/2010   Bilateral lower extremity edema 12/08/2008   NEUROPATHY, IDIOPATHIC PERIPHERAL NEC 05/31/2007   Osteoarthritis 03/22/2007   Gout 03/15/2007   Essential hypertension 03/15/2007    REFERRING DIAG: Chronic bilateral  low back pain without sciatica [M54.50, G89.29]  THERAPY DIAG: Difficulty in walking, not elsewhere classified   Muscle weakness (generalized)   Other low back pain   Other lack of coordination   Other abnormalities of gait and mobility   Rationale for Evaluation and Treatment Rehabilitation  PERTINENT HISTORY: Arthritis, DM, DJD  PRECAUTIONS: fall and transfers  SUBJECTIVE: Symptoms mildly improved.  Most discomfort noted when waking which eases as he sits  up   PAIN:  Are you having pain? Yes: NPRS scale: 8/10 Pain location: low back Pain description: R low back Aggravating factors: twisting  Relieving factors: medication     OBJECTIVE: (objective measures completed at initial evaluation unless otherwise dated)  DIAGNOSTIC FINDINGS:  None   COGNITION:           Overall cognitive status: Within functional limits for tasks assessed                          SENSATION: WFL     POSTURE: decreased lumbar lordosis and anterior pelvic tilt   PALPATION: Bilat QL   LUMBAR ROM:    Active  A/PROM  eval  Flexion 25%   Extension Pt unable to get to neutral   Right lateral flexion 75%   Left lateral flexion WFL  Right rotation 75%   Left rotation WFL   (Blank rows = not tested)   LOWER EXTREMITY ROM:      Active  Right eval Left eval  Hip flexion WFL with soft tissue restrictions.  WFL with soft tissue restrictions.  Knee flexion 88 in WC, pt states he is unable to go any further.  72 degrees in WC, pt reports restrictions due to past injuries.   Knee extension Lacking 15 degrees in WC Lacking 40 degrees in WC   (Blank rows = not tested)   LOWER EXTREMITY MMT:     MMT Right eval Left eval  Hip flexion 5 5  Hip abduction      Knee flexion 5 5  Knee extension 4 2+   (Blank rows = not tested)   LUMBAR SPECIAL TESTS:  Unable to assess due to weight restrictions on table at current clinic.    FUNCTIONAL TESTS:  None on eval due to time restrains. Assess next session.    GAIT: Distance walked: Self propelled WC 77f.  Assistive device utilized: Wheelchair (manual) Level of assistance: Complete Independence Comments: Pt self propels in WC, with R LE and Bilat UE's. Upon standing he was unable to stand up straight, but reports FWW being too narrow.        TODAY'S TREATMENT  [facility] Adult PT Treatment:                                                DATE: 05/22/22 Therapeutic Exercise: Seated ball roll flexion  15x Introduced seated core exercises of hip tosses, shoulder tosses, chops and Victories, 15 reps with 2000g weighted ball followed by latissimus pushdowns with inspiration on active press  FAQs with lat press 15/15 Standing in walker, semi tandem 30s per side w/o UE support  [facility] Adult PT Treatment:  DATE: 05/15/22 Therapeutic Exercise: Standing heel raises 10x Forward stepping B 10/10 in walker  FAQs B 10/10 Seated SLR L RTB 10x Seated SLR circles CW/CCW RTB 10/10  Seated forward flexion RTB abdominals Seated chest press RTB unsupported sitting B Seated chest press RTB unsupported sitting, unilateral 10/10 STS transfer from [facility] and WS tasks       PATIENT EDUCATION:  Education details: Educated pt on anatomy and physiology of current symptoms, diagnosis, prognosis, HEP,  and POC. Person educated: Patient Education method: Customer service manager Education comprehension: verbalized understanding and returned demonstration     HOME EXERCISE PROGRAM: Access Code: [email or web address] URL: [email or web address] Date: 05/22/2022 Prepared by: Sharlynn Oliphant  Exercises - Seated Abdominal Press into [employer]  - 2 x daily - 5 x weekly - 1 sets - 10 reps - 3s hold - Seated Flexion Stretch with Swiss Ball  - 2 x daily - 5 x weekly - 1 sets - 10 reps - 3s hold     ASSESSMENT:   CLINICAL IMPRESSION: Introduced seated core tasks today with weighted ball to facilitate abdominal and core strengthening.  HEP developed and reviewed.     OBJECTIVE IMPAIRMENTS: decreased activity tolerance, difficulty walking, decreased balance, decreased endurance, decreased mobility, decreased ROM, decreased strength, impaired flexibility, impaired LE use, postural dysfunction, and pain.   ACTIVITY LIMITATIONS: bending, lifting, carry, locomotion, cleaning, community activity, driving, and or occupation   PERSONAL FACTORS: Arthritis, DM, DJD are also  affecting patient's functional outcome.   REHAB POTENTIAL: Good   CLINICAL DECISION MAKING: Stable/uncomplicated   EVALUATION COMPLEXITY: Low       GOALS: Short term PT Goals Target date: 06/05/2022 Pt will be I and compliant with HEP. Baseline: [id number] Goal status: met   Long term PT goals Target date: 07/03/2022 Pt will improve ROM to [facility] to improve functional mobility Baseline: Goal status: New Pt will improve hip/knee strength to at least 4+/5 MMT to improve functional strength Baseline: Goal status: New Pt will reduce pain by overall 50% overall with usual activity Baseline: Goal status: New Pt will reduce pain to overall less than 2-3/10 with transfers. Baseline: Goal status: New Pt will be able to ambulate community distances at least 150 ft with FWW without pain.  Baseline: Goal status: New   PLAN: PT FREQUENCY: 1 times per week    PT DURATION: 6-8 weeks   PLANNED INTERVENTIONS (unless contraindicated): aquatic PT, Canalith repositioning, cryotherapy, Electrical stimulation, Iontophoresis with 4 mg/ml dexamethasome, Moist heat, traction, Ultrasound, gait training, Therapeutic exercise, balance training, neuromuscular re-education, patient/family education, prosthetic training, manual techniques, passive ROM, dry needling, taping, vasopnuematic device, vestibular, spinal manipulations, joint manipulations   PLAN FOR NEXT SESSION: Assess HEP/update PRN, continue to progress functional mobility, strengthen core, proximal hip muscles. Decrease patients pain and help minimize functional deficits, improve gait and balance. Use elevating mat table      Lanice Shirts, PT 05/22/2022, 1:23 PM

## 2022-05-29 ENCOUNTER — Ambulatory Visit: Payer: Medicare PPO | Attending: Family Medicine

## 2022-05-29 DIAGNOSIS — R262 Difficulty in walking, not elsewhere classified: Secondary | ICD-10-CM | POA: Insufficient documentation

## 2022-05-29 DIAGNOSIS — M5459 Other low back pain: Secondary | ICD-10-CM | POA: Diagnosis not present

## 2022-05-29 DIAGNOSIS — M6281 Muscle weakness (generalized): Secondary | ICD-10-CM | POA: Diagnosis not present

## 2022-05-29 NOTE — Therapy (Signed)
OUTPATIENT PHYSICAL THERAPY TREATMENT NOTE   Patient Name: Franklin Hicks MRN: [id number] DOB:June 04, 1951, 71 y.o., male Today's Date: 05/29/2022  PCP: Marin Olp, MD REFERRING PROVIDER: Marin Olp, MD  END OF SESSION:   PT End of Session - 05/29/22 1322     Visit Number 4    Number of Visits 8    Date for PT Re-Evaluation 07/10/22    Authorization Type [employer] MEDICARE CHOICE PPO    PT Start Time 1320    PT Stop Time 1400    PT Time Calculation (min) 40 min    Activity Tolerance Patient tolerated treatment well    Behavior During Therapy [facility] for tasks assessed/performed              Past Medical History:  Diagnosis Date   Arthritis    "knees" (12/17/2017)   CAD S/P percutaneous coronary angioplasty 09/20/2016   Nstemi 08/2016. Dr. Doylene Canard. DES- brilinta and asa. Requests change to [facility] cardiology; 95% Ramus -> PCI Resolute Onyx DES 2.75 x 18   Central cord syndrome ([place]) 12/17/2017   Chronic combined systolic and diastolic heart failure (HCC) 10/09/2016   EF 45% and grade II diastolic after nstemi   Diet-controlled diabetes mellitus (HCC)    DJD (degenerative joint disease)    Gout    High cholesterol    Hypertension    NSTEMI (non-ST elevated myocardial infarction) ([place]) 09/20/2016   95% Ramus - > PCI    Obesity    Recurrent pulmonary embolism ([place]) 11/'14; 4/'19   a) Bilateral segmental and subsegmental pulmonary emboli with mild RV Strain.; b) after fall with C-spine Fxr --> Acute PE of right main pulmonary artery extending into multiple segments.   Past Surgical History:  Procedure Laterality Date   CARDIAC CATHETERIZATION N/A 09/19/2016   Procedure: Left Heart Cath and Coronary Angiography;  Surgeon: Dixie Dials, MD;  Location: [place] [facility];  Service: Cardiovascular: 95% proximal Ramus Intermedius --> PCI   CARDIAC CATHETERIZATION N/A 09/19/2016   Procedure: Coronary Stent Intervention;  Surgeon: Nelva Bush, MD;  Location: [place] [facility];   Service: Cardiovascular: 95% ramus intermedius;  Resolute Onyx 2.75 x 18 mm drug-eluting stent   CYSTOSCOPY/RETROGRADE/URETEROSCOPY/STONE EXTRACTION WITH BASKET  9458,5929   ureteral stone    MENISCUS REPAIR Left    knee open meniscetomy   TRANSTHORACIC ECHOCARDIOGRAM  2004   no lvh nl ejection fraction   TRANSTHORACIC ECHOCARDIOGRAM  09/19/2016   In setting of NSTEMI:  EF 45-50% with diffuse hypokinesis. GR 2 DD. Mild biatrial enlargement.   Patient Active Problem List   Diagnosis Date Noted   Preoperative cardiovascular examination 02/05/2021   Recurrent pulmonary embolism (HCC)    Pain of left heel    C4 spinal cord injury, sequela ([place]) 12/18/2017   Tetraplegia ([place]) 12/18/2017   Aortic aneurysm ([place]) 12/16/2017   Cord compression ([place]) 12/13/2017   Chronic combined systolic and diastolic heart failure ([place]) 10/09/2016   CAD S/P percutaneous coronary angioplasty 09/20/2016   Primary osteoarthritis of knees, bilateral 01/11/2016   Erectile dysfunction 07/30/2015   Morbid obesity ([place]) 07/18/2013   Angioedema of lips 07/13/2013   Multinodular goiter 07/13/2013   SOB (shortness of breath) 10/02/2011   BPH with urinary obstruction 07/17/2010   Diabetes mellitus type 2, controlled ([place]) 07/17/2010   Bilateral lower extremity edema 12/08/2008   NEUROPATHY, IDIOPATHIC PERIPHERAL NEC 05/31/2007   Osteoarthritis 03/22/2007   Gout 03/15/2007   Essential hypertension 03/15/2007    REFERRING DIAG: Chronic  bilateral low back pain without sciatica [M54.50, G89.29]  THERAPY DIAG: Difficulty in walking, not elsewhere classified   Muscle weakness (generalized)   Other low back pain   Other lack of coordination   Other abnormalities of gait and mobility   Rationale for Evaluation and Treatment Rehabilitation  PERTINENT HISTORY: Arthritis, DM, DJD  PRECAUTIONS: fall and transfers  SUBJECTIVE: Symptoms mildly improved.  Most discomfort noted when waking which eases as he sits  up   PAIN:  Are you having pain? Yes: NPRS scale: 8/10 Pain location: low back Pain description: R low back Aggravating factors: twisting  Relieving factors: medication     OBJECTIVE: (objective measures completed at initial evaluation unless otherwise dated)  DIAGNOSTIC FINDINGS:  None   COGNITION:           Overall cognitive status: Within functional limits for tasks assessed                          SENSATION: WFL     POSTURE: decreased lumbar lordosis and anterior pelvic tilt   PALPATION: Bilat QL   LUMBAR ROM:    Active  A/PROM  eval  Flexion 25%   Extension Pt unable to get to neutral   Right lateral flexion 75%   Left lateral flexion WFL  Right rotation 75%   Left rotation WFL   (Blank rows = not tested)   LOWER EXTREMITY ROM:      Active  Right eval Left eval  Hip flexion WFL with soft tissue restrictions.  WFL with soft tissue restrictions.  Knee flexion 88 in WC, pt states he is unable to go any further.  72 degrees in WC, pt reports restrictions due to past injuries.   Knee extension Lacking 15 degrees in WC Lacking 40 degrees in WC   (Blank rows = not tested)   LOWER EXTREMITY MMT:     MMT Right eval Left eval  Hip flexion 5 5  Hip abduction      Knee flexion 5 5  Knee extension 4 2+   (Blank rows = not tested)   LUMBAR SPECIAL TESTS:  Unable to assess due to weight restrictions on table at current clinic.    FUNCTIONAL TESTS:  None on eval due to time restrains. Assess next session.    GAIT: Distance walked: Self propelled WC 2f.  Assistive device utilized: Wheelchair (manual) Level of assistance: Complete Independence Comments: Pt self propels in WC, with R LE and Bilat UE's. Upon standing he was unable to stand up straight, but reports FWW being too narrow.        TODAY'S TREATMENT  [facility] Adult PT Treatment:                                                DATE: 05/29/22 Therapeutic Exercise: Seated ball roll flexion 30s  x3 Latissimus pushdowns with inspiration on active press 10x Heel slides with lat press 15/15 Posterior lean with p-ball elevation 10x Seated trunk rotation with 2000g ball 10/10 Standing in walker, semi tandem 30s per side w/o UE support 5x STS w/o RW support, UEs on knees  Tandem stance in RW with 1 foot on 2 in step 30s x2 each side  [facility] Adult PT Treatment:  DATE: 05/22/22 Therapeutic Exercise: Seated ball roll flexion 15x Introduced seated core exercises of hip tosses, shoulder tosses, chops and Victories, 15 reps with 2000g weighted ball followed by latissimus pushdowns with inspiration on active press  FAQs with lat press 15/15 Standing in walker, semi tandem 30s per side w/o UE support  [facility] Adult PT Treatment:                                                DATE: 05/15/22 Therapeutic Exercise: Standing heel raises 10x Forward stepping B 10/10 in walker  FAQs B 10/10 Seated SLR L RTB 10x Seated SLR circles CW/CCW RTB 10/10  Seated forward flexion RTB abdominals Seated chest press RTB unsupported sitting B Seated chest press RTB unsupported sitting, unilateral 10/10 STS transfer from [facility] and WS tasks       PATIENT EDUCATION:  Education details: Educated pt on anatomy and physiology of current symptoms, diagnosis, prognosis, HEP,  and POC. Person educated: Patient Education method: Customer service manager Education comprehension: verbalized understanding and returned demonstration     HOME EXERCISE PROGRAM: Access Code: [email or web address] URL: [email or web address] Date: 05/22/2022 Prepared by: Sharlynn Oliphant  Exercises - Seated Abdominal Press into [employer]  - 2 x daily - 5 x weekly - 1 sets - 10 reps - 3s hold - Seated Flexion Stretch with Swiss Ball  - 2 x daily - 5 x weekly - 1 sets - 10 reps - 3s hold     ASSESSMENT:   CLINICAL IMPRESSION: Today's session continued to address core weakness trunk  extensor strength with patient in seated position as he has difficulty attaining and maintaining supine position.  Emphasized WS and STS transfers w/o UE support as appropriate.  Reported he is able to bend to retreive things from the floor with less discomfort and improved flexibility   OBJECTIVE IMPAIRMENTS: decreased activity tolerance, difficulty walking, decreased balance, decreased endurance, decreased mobility, decreased ROM, decreased strength, impaired flexibility, impaired LE use, postural dysfunction, and pain.   ACTIVITY LIMITATIONS: bending, lifting, carry, locomotion, cleaning, community activity, driving, and or occupation   PERSONAL FACTORS: Arthritis, DM, DJD are also affecting patient's functional outcome.   REHAB POTENTIAL: Good   CLINICAL DECISION MAKING: Stable/uncomplicated   EVALUATION COMPLEXITY: Low       GOALS: Short term PT Goals Target date: 06/05/2022 Pt will be I and compliant with HEP. Baseline: [id number] Goal status: met   Long term PT goals Target date: 07/03/2022 Pt will improve ROM to [facility] to improve functional mobility Baseline: Goal status: New Pt will improve hip/knee strength to at least 4+/5 MMT to improve functional strength Baseline: Goal status: New Pt will reduce pain by overall 50% overall with usual activity Baseline: Goal status: New Pt will reduce pain to overall less than 2-3/10 with transfers. Baseline: Goal status: New Pt will be able to ambulate community distances at least 150 ft with FWW without pain.  Baseline: Goal status: New   PLAN: PT FREQUENCY: 1 times per week    PT DURATION: 6-8 weeks   PLANNED INTERVENTIONS (unless contraindicated): aquatic PT, Canalith repositioning, cryotherapy, Electrical stimulation, Iontophoresis with 4 mg/ml dexamethasome, Moist heat, traction, Ultrasound, gait training, Therapeutic exercise, balance training, neuromuscular re-education, patient/family education, prosthetic training, manual  techniques, passive ROM, dry needling, taping, vasopnuematic device, vestibular, spinal manipulations, joint manipulations   PLAN FOR NEXT SESSION: Assess  HEP/update PRN, continue to progress functional mobility, strengthen core, proximal hip muscles. Decrease patients pain and help minimize functional deficits, improve gait and balance. Use elevating mat table      Lanice Shirts, PT 05/29/2022, 3:10 PM

## 2022-06-05 ENCOUNTER — Ambulatory Visit: Payer: Medicare PPO

## 2022-06-05 DIAGNOSIS — M6281 Muscle weakness (generalized): Secondary | ICD-10-CM

## 2022-06-05 DIAGNOSIS — R262 Difficulty in walking, not elsewhere classified: Secondary | ICD-10-CM

## 2022-06-05 DIAGNOSIS — M5459 Other low back pain: Secondary | ICD-10-CM | POA: Diagnosis not present

## 2022-06-05 NOTE — Therapy (Signed)
OUTPATIENT PHYSICAL THERAPY TREATMENT NOTE   Patient Name: Franklin Hicks MRN: [id number] DOB:1951/03/13, 71 y.o., male Today's Date: 06/05/2022  PCP: Marin Olp, MD REFERRING PROVIDER: Marin Olp, MD  END OF SESSION:   PT End of Session - 06/05/22 1450     Visit Number 5    Number of Visits 8    Date for PT Re-Evaluation 07/10/22    Authorization Type [employer] MEDICARE CHOICE PPO    PT Start Time 1500   patient late   PT Stop Time 1530    PT Time Calculation (min) 30 min    Activity Tolerance Patient tolerated treatment well    Behavior During Therapy [facility] for tasks assessed/performed               Past Medical History:  Diagnosis Date   Arthritis    "knees" (12/17/2017)   CAD S/P percutaneous coronary angioplasty 09/20/2016   Nstemi 08/2016. Dr. Doylene Canard. DES- brilinta and asa. Requests change to [facility] cardiology; 95% Ramus -> PCI Resolute Onyx DES 2.75 x 18   Central cord syndrome ([place]) 12/17/2017   Chronic combined systolic and diastolic heart failure (HCC) 10/09/2016   EF 45% and grade II diastolic after nstemi   Diet-controlled diabetes mellitus (HCC)    DJD (degenerative joint disease)    Gout    High cholesterol    Hypertension    NSTEMI (non-ST elevated myocardial infarction) ([place]) 09/20/2016   95% Ramus - > PCI    Obesity    Recurrent pulmonary embolism ([place]) 11/'14; 4/'19   a) Bilateral segmental and subsegmental pulmonary emboli with mild RV Strain.; b) after fall with C-spine Fxr --> Acute PE of right main pulmonary artery extending into multiple segments.   Past Surgical History:  Procedure Laterality Date   CARDIAC CATHETERIZATION N/A 09/19/2016   Procedure: Left Heart Cath and Coronary Angiography;  Surgeon: Dixie Dials, MD;  Location: [place] [facility];  Service: Cardiovascular: 95% proximal Ramus Intermedius --> PCI   CARDIAC CATHETERIZATION N/A 09/19/2016   Procedure: Coronary Stent Intervention;  Surgeon: Nelva Bush, MD;  Location: [place] [facility];  Service: Cardiovascular: 95% ramus intermedius;  Resolute Onyx 2.75 x 18 mm drug-eluting stent   CYSTOSCOPY/RETROGRADE/URETEROSCOPY/STONE EXTRACTION WITH BASKET  4098,1191   ureteral stone    MENISCUS REPAIR Left    knee open meniscetomy   TRANSTHORACIC ECHOCARDIOGRAM  2004   no lvh nl ejection fraction   TRANSTHORACIC ECHOCARDIOGRAM  09/19/2016   In setting of NSTEMI:  EF 45-50% with diffuse hypokinesis. GR 2 DD. Mild biatrial enlargement.   Patient Active Problem List   Diagnosis Date Noted   Preoperative cardiovascular examination 02/05/2021   Recurrent pulmonary embolism (HCC)    Pain of left heel    C4 spinal cord injury, sequela ([place]) 12/18/2017   Tetraplegia ([place]) 12/18/2017   Aortic aneurysm ([place]) 12/16/2017   Cord compression ([place]) 12/13/2017   Chronic combined systolic and diastolic heart failure ([place]) 10/09/2016   CAD S/P percutaneous coronary angioplasty 09/20/2016   Primary osteoarthritis of knees, bilateral 01/11/2016   Erectile dysfunction 07/30/2015   Morbid obesity ([place]) 07/18/2013   Angioedema of lips 07/13/2013   Multinodular goiter 07/13/2013   SOB (shortness of breath) 10/02/2011   BPH with urinary obstruction 07/17/2010   Diabetes mellitus type 2, controlled ([place]) 07/17/2010   Bilateral lower extremity edema 12/08/2008   NEUROPATHY, IDIOPATHIC PERIPHERAL NEC 05/31/2007   Osteoarthritis 03/22/2007   Gout 03/15/2007   Essential hypertension 03/15/2007  REFERRING DIAG: Chronic bilateral low back pain without sciatica [M54.50, G89.29]  THERAPY DIAG: Difficulty in walking, not elsewhere classified   Muscle weakness (generalized)   Other low back pain   Other lack of coordination   Other abnormalities of gait and mobility   Rationale for Evaluation and Treatment Rehabilitation  PERTINENT HISTORY: Arthritis, DM, DJD  PRECAUTIONS: fall and transfers  SUBJECTIVE: Felt he was doing well then bent over to tie shoes and spasm set  in.  Also describes shooting pains across lower ribs upon waking.   PAIN:  Are you having pain? Yes: NPRS scale: 8/10 Pain location: low back Pain description: R low back Aggravating factors: twisting  Relieving factors: medication     OBJECTIVE: (objective measures completed at initial evaluation unless otherwise dated)  DIAGNOSTIC FINDINGS:  None   COGNITION:           Overall cognitive status: Within functional limits for tasks assessed                          SENSATION: WFL     POSTURE: decreased lumbar lordosis and anterior pelvic tilt   PALPATION: Bilat QL   LUMBAR ROM:    Active  A/PROM  eval  Flexion 25%   Extension Pt unable to get to neutral   Right lateral flexion 75%   Left lateral flexion WFL  Right rotation 75%   Left rotation WFL   (Blank rows = not tested)   LOWER EXTREMITY ROM:      Active  Right eval Left eval  Hip flexion WFL with soft tissue restrictions.  WFL with soft tissue restrictions.  Knee flexion 88 in WC, pt states he is unable to go any further.  72 degrees in WC, pt reports restrictions due to past injuries.   Knee extension Lacking 15 degrees in WC Lacking 40 degrees in WC   (Blank rows = not tested)   LOWER EXTREMITY MMT:     MMT Right eval Left eval  Hip flexion 5 5  Hip abduction      Knee flexion 5 5  Knee extension 4 2+   (Blank rows = not tested)   LUMBAR SPECIAL TESTS:  Unable to assess due to weight restrictions on table at current clinic.    FUNCTIONAL TESTS:  None on eval due to time restrains. Assess next session.    GAIT: Distance walked: Self propelled WC 55f.  Assistive device utilized: Wheelchair (manual) Level of assistance: Complete Independence Comments: Pt self propels in WC, with R LE and Bilat UE's. Upon standing he was unable to stand up straight, but reports FWW being too narrow.        TODAY'S TREATMENT  [facility] Adult PT Treatment:                                                DATE:  06/05/22 Therapeutic Exercise: Seated ball roll flexion 30s x2 forward Seated ball roll flexion 30s x1 lateral Latissimus pushdowns with inspiration on active press 10x 3s hold Seated trunk rotation with 3000g ball 10/10 Seated hip tosses with 3000g ball 10/10   [facility] Adult PT Treatment:  DATE: 05/29/22 Therapeutic Exercise: Seated ball roll flexion 30s x3 Latissimus pushdowns with inspiration on active press 10x Heel slides with lat press 15/15 Posterior lean with p-ball elevation 10x Seated trunk rotation with 2000g ball 10/10 Standing in walker, semi tandem 30s per side w/o UE support 5x STS w/o RW support, UEs on knees  Tandem stance in RW with 1 foot on 2 in step 30s x2 each side  [facility] Adult PT Treatment:                                                DATE: 05/22/22 Therapeutic Exercise: Seated ball roll flexion 15x Introduced seated core exercises of hip tosses, shoulder tosses, chops and Victories, 15 reps with 2000g weighted ball followed by latissimus pushdowns with inspiration on active press  FAQs with lat press 15/15 Standing in walker, semi tandem 30s per side w/o UE support  [facility] Adult PT Treatment:                                                DATE: 05/15/22 Therapeutic Exercise: Standing heel raises 10x Forward stepping B 10/10 in walker  FAQs B 10/10 Seated SLR L RTB 10x Seated SLR circles CW/CCW RTB 10/10  Seated forward flexion RTB abdominals Seated chest press RTB unsupported sitting B Seated chest press RTB unsupported sitting, unilateral 10/10 STS transfer from [facility] and WS tasks       PATIENT EDUCATION:  Education details: Educated pt on anatomy and physiology of current symptoms, diagnosis, prognosis, HEP,  and POC. Person educated: Patient Education method: Customer service manager Education comprehension: verbalized understanding and returned demonstration     HOME EXERCISE PROGRAM: Access Code:  [email or web address] URL: [email or web address] Date: 05/22/2022 Prepared by: Sharlynn Oliphant  Exercises - Seated Abdominal Press into [employer]  - 2 x daily - 5 x weekly - 1 sets - 10 reps - 3s hold - Seated Flexion Stretch with Swiss Ball  - 2 x daily - 5 x weekly - 1 sets - 10 reps - 3s hold     ASSESSMENT:   CLINICAL IMPRESSION: Continued to progress with stretching and core strengthening as tolerated.  Symptoms exacerbated by twisting and rotation motions suggesting vertebrogenic cause. Patient now receptive to pursue imaging studies in conjunction with ongoing PT.    OBJECTIVE IMPAIRMENTS: decreased activity tolerance, difficulty walking, decreased balance, decreased endurance, decreased mobility, decreased ROM, decreased strength, impaired flexibility, impaired LE use, postural dysfunction, and pain.   ACTIVITY LIMITATIONS: bending, lifting, carry, locomotion, cleaning, community activity, driving, and or occupation   PERSONAL FACTORS: Arthritis, DM, DJD are also affecting patient's functional outcome.   REHAB POTENTIAL: Good   CLINICAL DECISION MAKING: Stable/uncomplicated   EVALUATION COMPLEXITY: Low       GOALS: Short term PT Goals Target date: 06/05/2022 Pt will be I and compliant with HEP. Baseline: [id number] Goal status: met   Long term PT goals Target date: 07/03/2022 Pt will improve ROM to [facility] to improve functional mobility Baseline: Goal status: New Pt will improve hip/knee strength to at least 4+/5 MMT to improve functional strength Baseline: Goal status: New Pt will reduce pain by overall 50% overall with usual activity Baseline: Goal status: New Pt will reduce pain  to overall less than 2-3/10 with transfers. Baseline: Goal status: New Pt will be able to ambulate community distances at least 150 ft with FWW without pain.  Baseline: Goal status: New   PLAN: PT FREQUENCY: 1 times per week    PT DURATION: 6-8 weeks   PLANNED INTERVENTIONS (unless  contraindicated): aquatic PT, Canalith repositioning, cryotherapy, Electrical stimulation, Iontophoresis with 4 mg/ml dexamethasome, Moist heat, traction, Ultrasound, gait training, Therapeutic exercise, balance training, neuromuscular re-education, patient/family education, prosthetic training, manual techniques, passive ROM, dry needling, taping, vasopnuematic device, vestibular, spinal manipulations, joint manipulations   PLAN FOR NEXT SESSION: Assess HEP/update PRN, continue to progress functional mobility, strengthen core, proximal hip muscles. Decrease patients pain and help minimize functional deficits, improve gait and balance. Use elevating mat table      Lanice Shirts, PT 06/05/2022, 3:44 PM

## 2022-06-12 ENCOUNTER — Ambulatory Visit: Payer: Medicare PPO

## 2022-06-12 DIAGNOSIS — M5459 Other low back pain: Secondary | ICD-10-CM | POA: Diagnosis not present

## 2022-06-12 DIAGNOSIS — M6281 Muscle weakness (generalized): Secondary | ICD-10-CM | POA: Diagnosis not present

## 2022-06-12 DIAGNOSIS — R262 Difficulty in walking, not elsewhere classified: Secondary | ICD-10-CM | POA: Diagnosis not present

## 2022-06-12 NOTE — Therapy (Signed)
OUTPATIENT PHYSICAL THERAPY TREATMENT NOTE   Patient Name: Franklin Hicks MRN: [id number] DOB:[birth year], 71 y.o., male Today's Date: 06/12/2022  PCP: Marin Olp, MD REFERRING PROVIDER: Marin Olp, MD  END OF SESSION:   PT End of Session - 06/12/22 1320     Visit Number 6    Number of Visits 8    Date for PT Re-Evaluation 07/10/22    Authorization Type [employer] MEDICARE CHOICE PPO    PT Start Time 1315    PT Stop Time 6295    PT Time Calculation (min) 40 min    Activity Tolerance Patient tolerated treatment well    Behavior During Therapy [facility] for tasks assessed/performed                Past Medical History:  Diagnosis Date   Arthritis    "knees" (12/17/2017)   CAD S/P percutaneous coronary angioplasty 09/20/2016   Nstemi 08/2016. Dr. Doylene Canard. DES- brilinta and asa. Requests change to [facility] cardiology; 95% Ramus -> PCI Resolute Onyx DES 2.75 x 18   Central cord syndrome ([place]) 12/17/2017   Chronic combined systolic and diastolic heart failure (HCC) 10/09/2016   EF 45% and grade II diastolic after nstemi   Diet-controlled diabetes mellitus (HCC)    DJD (degenerative joint disease)    Gout    High cholesterol    Hypertension    NSTEMI (non-ST elevated myocardial infarction) ([place]) 09/20/2016   95% Ramus - > PCI    Obesity    Recurrent pulmonary embolism ([place]) 11/'14; 4/'19   a) Bilateral segmental and subsegmental pulmonary emboli with mild RV Strain.; b) after fall with C-spine Fxr --> Acute PE of right main pulmonary artery extending into multiple segments.   Past Surgical History:  Procedure Laterality Date   CARDIAC CATHETERIZATION N/A 09/19/2016   Procedure: Left Heart Cath and Coronary Angiography;  Surgeon: Dixie Dials, MD;  Location: [place] [facility];  Service: Cardiovascular: 95% proximal Ramus Intermedius --> PCI   CARDIAC CATHETERIZATION N/A 09/19/2016   Procedure: Coronary Stent Intervention;  Surgeon: Nelva Bush, MD;  Location: [place] [facility];  Service: Cardiovascular: 95% ramus intermedius;  Resolute Onyx 2.75 x 18 mm drug-eluting stent   CYSTOSCOPY/RETROGRADE/URETEROSCOPY/STONE EXTRACTION WITH BASKET  2841,3244   ureteral stone    MENISCUS REPAIR Left    knee open meniscetomy   TRANSTHORACIC ECHOCARDIOGRAM  2004   no lvh nl ejection fraction   TRANSTHORACIC ECHOCARDIOGRAM  09/19/2016   In setting of NSTEMI:  EF 45-50% with diffuse hypokinesis. GR 2 DD. Mild biatrial enlargement.   Patient Active Problem List   Diagnosis Date Noted   Preoperative cardiovascular examination 02/05/2021   Recurrent pulmonary embolism (HCC)    Pain of left heel    C4 spinal cord injury, sequela ([place]) 12/18/2017   Tetraplegia ([place]) 12/18/2017   Aortic aneurysm ([place]) 12/16/2017   Cord compression ([place]) 12/13/2017   Chronic combined systolic and diastolic heart failure ([place]) 10/09/2016   CAD S/P percutaneous coronary angioplasty 09/20/2016   Primary osteoarthritis of knees, bilateral 01/11/2016   Erectile dysfunction 07/30/2015   Morbid obesity ([place]) 07/18/2013   Angioedema of lips 07/13/2013   Multinodular goiter 07/13/2013   SOB (shortness of breath) 10/02/2011   BPH with urinary obstruction 07/17/2010   Diabetes mellitus type 2, controlled ([place]) 07/17/2010   Bilateral lower extremity edema 12/08/2008   NEUROPATHY, IDIOPATHIC PERIPHERAL NEC 05/31/2007   Osteoarthritis 03/22/2007   Gout 03/15/2007   Essential hypertension 03/15/2007    REFERRING  DIAG: Chronic bilateral low back pain without sciatica [M54.50, G89.29]  THERAPY DIAG: Difficulty in walking, not elsewhere classified   Muscle weakness (generalized)   Other low back pain   Other lack of coordination   Other abnormalities of gait and mobility   Rationale for Evaluation and Treatment Rehabilitation  PERTINENT HISTORY: Arthritis, DM, DJD  PRECAUTIONS: fall and transfers  SUBJECTIVE: Has not felt any sharp pain since last session, pain rated at 0/10   PAIN:   Are you having pain? Yes: NPRS scale: 8/10 Pain location: low back Pain description: R low back Aggravating factors: twisting  Relieving factors: medication     OBJECTIVE: (objective measures completed at initial evaluation unless otherwise dated)  DIAGNOSTIC FINDINGS:  None   COGNITION:           Overall cognitive status: Within functional limits for tasks assessed                          SENSATION: WFL     POSTURE: decreased lumbar lordosis and anterior pelvic tilt   PALPATION: Bilat QL   LUMBAR ROM:    Active  A/PROM  eval  Flexion 25%   Extension Pt unable to get to neutral   Right lateral flexion 75%   Left lateral flexion WFL  Right rotation 75%   Left rotation WFL   (Blank rows = not tested)   LOWER EXTREMITY ROM:      Active  Right eval Left eval  Hip flexion WFL with soft tissue restrictions.  WFL with soft tissue restrictions.  Knee flexion 88 in WC, pt states he is unable to go any further.  72 degrees in WC, pt reports restrictions due to past injuries.   Knee extension Lacking 15 degrees in WC Lacking 40 degrees in WC   (Blank rows = not tested)   LOWER EXTREMITY MMT:     MMT Right eval Left eval  Hip flexion 5 5  Hip abduction      Knee flexion 5 5  Knee extension 4 2+   (Blank rows = not tested)   LUMBAR SPECIAL TESTS:  Unable to assess due to weight restrictions on table at current clinic.    FUNCTIONAL TESTS:  None on eval due to time restrains. Assess next session.    GAIT: Distance walked: Self propelled WC 64f.  Assistive device utilized: Wheelchair (manual) Level of assistance: Complete Independence Comments: Pt self propels in WC, with R LE and Bilat UE's. Upon standing he was unable to stand up straight, but reports FWW being too narrow.        TODAY'S TREATMENT  [facility] Adult PT Treatment:                                                DATE: 06/12/22 Therapeutic Exercise: Seated ball roll flexion 30s x3 Latissimus  pushdowns with inspiration on active press 10x Heel slides with lat press 15/15 Posterior lean with p-ball elevation 10x Seated trunk rotation with 2000g ball 10/10 hip level Seated trunk rotation with 2000g ball 10/10 shoulder level Seated Vs with 2000g ball 10x Standing in walker 30s w/o UE support 5x STS w/o RW support, UEs on knees  Tandem stance in RW with 1 foot on 2 in step 30s x2 each side Seated trunk flexion/extension placing 3000g ball  onto stool and back 15x  [facility] Adult PT Treatment:                                                DATE: 06/05/22 Therapeutic Exercise: Seated ball roll flexion 30s x2 forward Seated ball roll flexion 30s x1 lateral Latissimus pushdowns with inspiration on active press 10x 3s hold Seated trunk rotation with 3000g ball 10/10 Seated hip tosses with 3000g ball 10/10   [facility] Adult PT Treatment:                                                DATE: 05/29/22 Therapeutic Exercise: Seated ball roll flexion 30s x3 Latissimus pushdowns with inspiration on active press 10x Heel slides with lat press 15/15 Posterior lean with p-ball elevation 10x Seated trunk rotation with 2000g ball 10/10 Standing in walker, semi tandem 30s per side w/o UE support 5x STS w/o RW support, UEs on knees  Tandem stance in RW with 1 foot on 2 in step 30s x2 each side  [facility] Adult PT Treatment:                                                DATE: 05/22/22 Therapeutic Exercise: Seated ball roll flexion 15x Introduced seated core exercises of hip tosses, shoulder tosses, chops and Victories, 15 reps with 2000g weighted ball followed by latissimus pushdowns with inspiration on active press  FAQs with lat press 15/15 Standing in walker, semi tandem 30s per side w/o UE support  [facility] Adult PT Treatment:                                                DATE: 05/15/22 Therapeutic Exercise: Standing heel raises 10x Forward stepping B 10/10 in walker  FAQs B 10/10 Seated SLR L RTB  10x Seated SLR circles CW/CCW RTB 10/10  Seated forward flexion RTB abdominals Seated chest press RTB unsupported sitting B Seated chest press RTB unsupported sitting, unilateral 10/10 STS transfer from [facility] and WS tasks       PATIENT EDUCATION:  Education details: Educated pt on anatomy and physiology of current symptoms, diagnosis, prognosis, HEP,  and POC. Person educated: Patient Education method: Customer service manager Education comprehension: verbalized understanding and returned demonstration     HOME EXERCISE PROGRAM: Access Code: [email or web address] URL: [email or web address] Date: 05/22/2022 Prepared by: Sharlynn Oliphant  Exercises - Seated Abdominal Press into [employer]  - 2 x daily - 5 x weekly - 1 sets - 10 reps - 3s hold - Seated Flexion Stretch with Swiss Ball  - 2 x daily - 5 x weekly - 1 sets - 10 reps - 3s hold     ASSESSMENT:   CLINICAL IMPRESSION: Due to decreased symptoms from last session, able to resume more challenging tasks and core strengthening activities. Continued seated core exercises as able to accommodate difficulty lying supine or maintaining standing position    OBJECTIVE IMPAIRMENTS: decreased activity tolerance, difficulty  walking, decreased balance, decreased endurance, decreased mobility, decreased ROM, decreased strength, impaired flexibility, impaired LE use, postural dysfunction, and pain.   ACTIVITY LIMITATIONS: bending, lifting, carry, locomotion, cleaning, community activity, driving, and or occupation   PERSONAL FACTORS: Arthritis, DM, DJD are also affecting patient's functional outcome.   REHAB POTENTIAL: Good   CLINICAL DECISION MAKING: Stable/uncomplicated   EVALUATION COMPLEXITY: Low       GOALS: Short term PT Goals Target date: 06/05/2022 Pt will be I and compliant with HEP. Baseline: [id number] Goal status: met   Long term PT goals Target date: 07/03/2022 Pt will improve ROM to [facility] to improve functional  mobility Baseline: Goal status: New Pt will improve hip/knee strength to at least 4+/5 MMT to improve functional strength Baseline: Goal status: New Pt will reduce pain by overall 50% overall with usual activity Baseline: Goal status: New Pt will reduce pain to overall less than 2-3/10 with transfers. Baseline: Goal status: New Pt will be able to ambulate community distances at least 150 ft with FWW without pain.  Baseline: Goal status: New   PLAN: PT FREQUENCY: 1 times per week    PT DURATION: 6-8 weeks   PLANNED INTERVENTIONS (unless contraindicated): aquatic PT, Canalith repositioning, cryotherapy, Electrical stimulation, Iontophoresis with 4 mg/ml dexamethasome, Moist heat, traction, Ultrasound, gait training, Therapeutic exercise, balance training, neuromuscular re-education, patient/family education, prosthetic training, manual techniques, passive ROM, dry needling, taping, vasopnuematic device, vestibular, spinal manipulations, joint manipulations   PLAN FOR NEXT SESSION: Assess HEP/update PRN, continue to progress functional mobility, strengthen core, proximal hip muscles. Decrease patients pain and help minimize functional deficits, improve gait and balance. Use elevating mat table      Lanice Shirts, PT 06/12/2022, 1:59 PM

## 2022-06-13 ENCOUNTER — Other Ambulatory Visit: Payer: Self-pay

## 2022-06-13 ENCOUNTER — Encounter: Payer: Self-pay | Admitting: Family Medicine

## 2022-06-13 ENCOUNTER — Other Ambulatory Visit: Payer: Self-pay | Admitting: Family Medicine

## 2022-06-13 MED ORDER — TIZANIDINE HCL 2 MG PO CAPS: 2.0000 mg | ORAL_CAPSULE | Freq: Three times a day (TID) | ORAL | 2 refills | Status: DC | PRN

## 2022-06-19 ENCOUNTER — Ambulatory Visit: Payer: Medicare PPO

## 2022-06-19 DIAGNOSIS — R262 Difficulty in walking, not elsewhere classified: Secondary | ICD-10-CM

## 2022-06-19 DIAGNOSIS — M5459 Other low back pain: Secondary | ICD-10-CM | POA: Diagnosis not present

## 2022-06-19 DIAGNOSIS — M6281 Muscle weakness (generalized): Secondary | ICD-10-CM

## 2022-06-19 NOTE — Therapy (Signed)
OUTPATIENT PHYSICAL THERAPY TREATMENT NOTE   Patient Name: Franklin Hicks MRN: [id number] DOB:1951-05-02, 71 y.o., male Today's Date: 06/19/2022  PCP: Marin Olp, MD REFERRING PROVIDER: Marin Olp, MD  END OF SESSION:   PT End of Session - 06/19/22 1441     Visit Number 7    Number of Visits 8    Date for PT Re-Evaluation 07/10/22    Authorization Type [employer] MEDICARE CHOICE PPO    PT Start Time 1324    PT Stop Time 1525    PT Time Calculation (min) 40 min    Activity Tolerance Patient tolerated treatment well    Behavior During Therapy [facility] for tasks assessed/performed                Past Medical History:  Diagnosis Date   Arthritis    "knees" (12/17/2017)   CAD S/P percutaneous coronary angioplasty 09/20/2016   Nstemi 08/2016. Dr. Doylene Canard. DES- brilinta and asa. Requests change to [facility] cardiology; 95% Ramus -> PCI Resolute Onyx DES 2.75 x 18   Central cord syndrome ([place]) 12/17/2017   Chronic combined systolic and diastolic heart failure (HCC) 10/09/2016   EF 45% and grade II diastolic after nstemi   Diet-controlled diabetes mellitus (HCC)    DJD (degenerative joint disease)    Gout    High cholesterol    Hypertension    NSTEMI (non-ST elevated myocardial infarction) ([place]) 09/20/2016   95% Ramus - > PCI    Obesity    Recurrent pulmonary embolism ([place]) 11/'14; 4/'19   a) Bilateral segmental and subsegmental pulmonary emboli with mild RV Strain.; b) after fall with C-spine Fxr --> Acute PE of right main pulmonary artery extending into multiple segments.   Past Surgical History:  Procedure Laterality Date   CARDIAC CATHETERIZATION N/A 09/19/2016   Procedure: Left Heart Cath and Coronary Angiography;  Surgeon: Dixie Dials, MD;  Location: [place] [facility];  Service: Cardiovascular: 95% proximal Ramus Intermedius --> PCI   CARDIAC CATHETERIZATION N/A 09/19/2016   Procedure: Coronary Stent Intervention;  Surgeon: Nelva Bush, MD;  Location: [place] [facility];  Service: Cardiovascular: 95% ramus intermedius;  Resolute Onyx 2.75 x 18 mm drug-eluting stent   CYSTOSCOPY/RETROGRADE/URETEROSCOPY/STONE EXTRACTION WITH BASKET  4010,2725   ureteral stone    MENISCUS REPAIR Left    knee open meniscetomy   TRANSTHORACIC ECHOCARDIOGRAM  2004   no lvh nl ejection fraction   TRANSTHORACIC ECHOCARDIOGRAM  09/19/2016   In setting of NSTEMI:  EF 45-50% with diffuse hypokinesis. GR 2 DD. Mild biatrial enlargement.   Patient Active Problem List   Diagnosis Date Noted   Preoperative cardiovascular examination 02/05/2021   Recurrent pulmonary embolism (HCC)    Pain of left heel    C4 spinal cord injury, sequela ([place]) 12/18/2017   Tetraplegia ([place]) 12/18/2017   Aortic aneurysm ([place]) 12/16/2017   Cord compression ([place]) 12/13/2017   Chronic combined systolic and diastolic heart failure ([place]) 10/09/2016   CAD S/P percutaneous coronary angioplasty 09/20/2016   Primary osteoarthritis of knees, bilateral 01/11/2016   Erectile dysfunction 07/30/2015   Morbid obesity ([place]) 07/18/2013   Angioedema of lips 07/13/2013   Multinodular goiter 07/13/2013   SOB (shortness of breath) 10/02/2011   BPH with urinary obstruction 07/17/2010   Diabetes mellitus type 2, controlled ([place]) 07/17/2010   Bilateral lower extremity edema 12/08/2008   NEUROPATHY, IDIOPATHIC PERIPHERAL NEC 05/31/2007   Osteoarthritis 03/22/2007   Gout 03/15/2007   Essential hypertension 03/15/2007    REFERRING  DIAG: Chronic bilateral low back pain without sciatica [M54.50, G89.29]  THERAPY DIAG: Difficulty in walking, not elsewhere classified   Muscle weakness (generalized)   Other low back pain   Other lack of coordination   Other abnormalities of gait and mobility   Rationale for Evaluation and Treatment Rehabilitation  PERTINENT HISTORY: Arthritis, DM, DJD  PRECAUTIONS: fall and transfers  SUBJECTIVE: Reports no sharp pain or spasm over the past week.  Has gotten   PAIN:  Are  you having pain? Yes: NPRS scale: 8/10 Pain location: low back Pain description: R low back Aggravating factors: twisting  Relieving factors: medication     OBJECTIVE: (objective measures completed at initial evaluation unless otherwise dated)  DIAGNOSTIC FINDINGS:  None   COGNITION:           Overall cognitive status: Within functional limits for tasks assessed                          SENSATION: WFL     POSTURE: decreased lumbar lordosis and anterior pelvic tilt   PALPATION: Bilat QL   LUMBAR ROM:    Active  A/PROM  eval AROM 06/19/22  Flexion 25%  WFL  Extension Pt unable to get to neutral  Lacks 25% from neutral  Right lateral flexion 75%  75%  Left lateral flexion WFL 75%  Right rotation 75%    Left rotation WFL    (Blank rows = not tested)   LOWER EXTREMITY ROM:      Active  Right eval Left eval  Hip flexion WFL with soft tissue restrictions.  WFL with soft tissue restrictions.  Knee flexion 88 in WC, pt states he is unable to go any further.  72 degrees in WC, pt reports restrictions due to past injuries.   Knee extension Lacking 15 degrees in WC Lacking 40 degrees in WC   (Blank rows = not tested)   LOWER EXTREMITY MMT:     MMT Right eval Left eval L 06/19/22  Hip flexion _0 Hip abduction       Knee flexion _1 Knee extension 4 2+ 4+   (Blank rows = not tested)   LUMBAR SPECIAL TESTS:  Unable to assess due to weight restrictions on table at current clinic.    FUNCTIONAL TESTS:  None on eval due to time restrains. Assess next session.    GAIT: Distance walked: Self propelled WC 109f.  Assistive device utilized: Wheelchair (manual) Level of assistance: Complete Independence Comments: Pt self propels in WC, with R LE and Bilat UE's. Upon standing he was unable to stand up straight, but reports FWW being too narrow.        TODAY'S TREATMENT  [facility] Adult PT Treatment:                                                DATE:  06/19/22 Therapeutic Exercise: 1034fambulation with SBA and RW Seated ball roll flexion 30s x3 Latissimus pushdowns with inspiration on active press 10x Alternating marching with lat press 10/10 Posterior lean with p-ball elevation 10x Seated trunk rotation with 3000g ball 10/10 hip level Seated trunk rotation with 3000g ball 10/10 shoulder level Seated Vs with 3000g ball 10x Seated trunk flexion/extension placing 3000g ball onto stool and back 15x  [facility] Adult  PT Treatment:                                                DATE: 06/12/22 Therapeutic Exercise: Seated ball roll flexion 30s x3 Latissimus pushdowns with inspiration on active press 10x Heel slides with lat press 15/15 Posterior lean with p-ball elevation 10x Seated trunk rotation with 2000g ball 10/10 hip level Seated trunk rotation with 2000g ball 10/10 shoulder level Seated Vs with 2000g ball 10x Standing in walker 30s w/o UE support 5x STS w/o RW support, UEs on knees  Tandem stance in RW with 1 foot on 2 in step 30s x2 each side Seated trunk flexion/extension placing 3000g ball onto stool and back 15x  [facility] Adult PT Treatment:                                                DATE: 06/05/22 Therapeutic Exercise: Seated ball roll flexion 30s x2 forward Seated ball roll flexion 30s x1 lateral Latissimus pushdowns with inspiration on active press 10x 3s hold Seated trunk rotation with 3000g ball 10/10 Seated hip tosses with 3000g ball 10/10       PATIENT EDUCATION:  Education details: Educated pt on anatomy and physiology of current symptoms, diagnosis, prognosis, HEP,  and POC. Person educated: Patient Education method: Customer service manager Education comprehension: verbalized understanding and returned demonstration     HOME EXERCISE PROGRAM: Access Code: [email or web address] URL: [email or web address] Date: 05/22/2022 Prepared by: Sharlynn Oliphant  Exercises - Seated Abdominal Press into [employer]  - 2 x daily - 5 x weekly - 1 sets - 10 reps - 3s hold - Seated Flexion Stretch with Swiss Ball  - 2 x daily - 5 x weekly - 1 sets - 10 reps - 3s hold     ASSESSMENT:   CLINICAL IMPRESSION: Today's session focused on goal progress and HEP update.  Patient has shown gains in strength in BLE's however AROM of trunk remains restricted in extension with remaining motions functional for ADLs.     OBJECTIVE IMPAIRMENTS: decreased activity tolerance, difficulty walking, decreased balance, decreased endurance, decreased mobility, decreased ROM, decreased strength, impaired flexibility, impaired LE use, postural dysfunction, and pain.   ACTIVITY LIMITATIONS: bending, lifting, carry, locomotion, cleaning, community activity, driving, and or occupation   PERSONAL FACTORS: Arthritis, DM, DJD are also affecting patient's functional outcome.   REHAB POTENTIAL: Good   CLINICAL DECISION MAKING: Stable/uncomplicated   EVALUATION COMPLEXITY: Low       GOALS: Short term PT Goals Target date: 06/05/2022 Pt will be I and compliant with HEP. Baseline: [id number] Goal status: met   Long term PT goals Target date: 07/03/2022 Pt will improve ROM to [facility] to improve functional mobility Baseline: Goal status: Patient presents with ROM WFL for tasks required to complete ADLs and mobility, ongoing  Pt will improve hip/knee strength to at least 4+/5 MMT to improve functional strength Baseline: MMT Right eval Left eval L 06/19/22  Hip flexion _0 Hip abduction       Knee flexion _1 Knee extension 4 2+ 4+   Goal status: Met  Pt will reduce pain by overall 50% overall with usual activity Baseline: 06/19/22 Pain described as  soreness Goal status: Ongoing  Pt will reduce pain to overall less than 2-3/10 with transfers. Baseline: 06/19/22 No pain with transfers but requires multiple attempts to stand Goal status: Met  Pt will be able to ambulate community distances at least 150 ft with FWW  without pain.  Baseline: 06/19/22 19f wirh RW, SBA across level ground Goal status: Ongoing   PLAN: PT FREQUENCY: 1 times per week    PT DURATION: 6-8 weeks   PLANNED INTERVENTIONS (unless contraindicated): aquatic PT, Canalith repositioning, cryotherapy, Electrical stimulation, Iontophoresis with 4 mg/ml dexamethasome, Moist heat, traction, Ultrasound, gait training, Therapeutic exercise, balance training, neuromuscular re-education, patient/family education, prosthetic training, manual techniques, passive ROM, dry needling, taping, vasopnuematic device, vestibular, spinal manipulations, joint manipulations   PLAN FOR NEXT SESSION: Assess HEP/update PRN, continue to progress functional mobility, strengthen core, proximal hip muscles. Decrease patients pain and help minimize functional deficits, improve gait and balance. Use elevating mat table      JLanice Shirts PT 06/19/2022, 3:53 PM

## 2022-06-26 ENCOUNTER — Ambulatory Visit: Payer: Medicare PPO | Attending: Family Medicine

## 2022-06-26 DIAGNOSIS — M5459 Other low back pain: Secondary | ICD-10-CM | POA: Insufficient documentation

## 2022-06-26 DIAGNOSIS — R2681 Unsteadiness on feet: Secondary | ICD-10-CM | POA: Insufficient documentation

## 2022-06-26 DIAGNOSIS — R531 Weakness: Secondary | ICD-10-CM | POA: Insufficient documentation

## 2022-06-26 NOTE — Therapy (Signed)
OUTPATIENT PHYSICAL THERAPY TREATMENT NOTE   Patient Name: Franklin Hicks MRN: [id number] DOB:Nov 08, 1950, 71 y.o., male Today's Date: 06/26/2022  PCP: Marin Olp, MD REFERRING PROVIDER: Marin Olp, MD  END OF SESSION:   PT End of Session - 06/26/22 1328     Visit Number 8    Number of Visits 9    Date for PT Re-Evaluation 07/10/22    Authorization Type [employer] MEDICARE CHOICE PPO    PT Start Time 1320    PT Stop Time 1400    PT Time Calculation (min) 40 min    Activity Tolerance Patient tolerated treatment well    Behavior During Therapy WFL for tasks assessed/performed                Past Medical History:  Diagnosis Date   Arthritis    "knees" (12/17/2017)   CAD S/P percutaneous coronary angioplasty 09/20/2016   Nstemi 08/2016. Dr. Doylene Canard. DES- brilinta and asa. Requests change to [facility] cardiology; 95% Ramus -> PCI Resolute Onyx DES 2.75 x 18   Central cord syndrome ([place]) 12/17/2017   Chronic combined systolic and diastolic heart failure (HCC) 10/09/2016   EF 45% and grade II diastolic after nstemi   Diet-controlled diabetes mellitus (HCC)    DJD (degenerative joint disease)    Gout    High cholesterol    Hypertension    NSTEMI (non-ST elevated myocardial infarction) ([place]) 09/20/2016   95% Ramus - > PCI    Obesity    Recurrent pulmonary embolism ([place]) 11/'14; 4/'19   a) Bilateral segmental and subsegmental pulmonary emboli with mild RV Strain.; b) after fall with C-spine Fxr --> Acute PE of right main pulmonary artery extending into multiple segments.   Past Surgical History:  Procedure Laterality Date   CARDIAC CATHETERIZATION N/A 09/19/2016   Procedure: Left Heart Cath and Coronary Angiography;  Surgeon: Dixie Dials, MD;  Location: [place] [facility];  Service: Cardiovascular: 95% proximal Ramus Intermedius --> PCI   CARDIAC CATHETERIZATION N/A 09/19/2016   Procedure: Coronary Stent Intervention;  Surgeon: Nelva Bush, MD;  Location: [place] [facility];  Service: Cardiovascular: 95% ramus intermedius;  Resolute Onyx 2.75 x 18 mm drug-eluting stent   CYSTOSCOPY/RETROGRADE/URETEROSCOPY/STONE EXTRACTION WITH BASKET  9323,5573   ureteral stone    MENISCUS REPAIR Left    knee open meniscetomy   TRANSTHORACIC ECHOCARDIOGRAM  2004   no lvh nl ejection fraction   TRANSTHORACIC ECHOCARDIOGRAM  09/19/2016   In setting of NSTEMI:  EF 45-50% with diffuse hypokinesis. GR 2 DD. Mild biatrial enlargement.   Patient Active Problem List   Diagnosis Date Noted   Preoperative cardiovascular examination 02/05/2021   Recurrent pulmonary embolism (HCC)    Pain of left heel    C4 spinal cord injury, sequela ([place]) 12/18/2017   Tetraplegia ([place]) 12/18/2017   Aortic aneurysm ([place]) 12/16/2017   Cord compression ([place]) 12/13/2017   Chronic combined systolic and diastolic heart failure ([place]) 10/09/2016   CAD S/P percutaneous coronary angioplasty 09/20/2016   Primary osteoarthritis of knees, bilateral 01/11/2016   Erectile dysfunction 07/30/2015   Morbid obesity ([place]) 07/18/2013   Angioedema of lips 07/13/2013   Multinodular goiter 07/13/2013   SOB (shortness of breath) 10/02/2011   BPH with urinary obstruction 07/17/2010   Diabetes mellitus type 2, controlled ([place]) 07/17/2010   Bilateral lower extremity edema 12/08/2008   NEUROPATHY, IDIOPATHIC PERIPHERAL NEC 05/31/2007   Osteoarthritis 03/22/2007   Gout 03/15/2007   Essential hypertension 03/15/2007    REFERRING  DIAG: Chronic bilateral low back pain without sciatica [M54.50, G89.29]  THERAPY DIAG: Difficulty in walking, not elsewhere classified   Muscle weakness (generalized)   Other low back pain   Other lack of coordination   Other abnormalities of gait and mobility   Rationale for Evaluation and Treatment Rehabilitation  PERTINENT HISTORY: Arthritis, DM, DJD  PRECAUTIONS: fall and transfers  SUBJECTIVE: No pain reported since last session, occasional "twinge", 1/10 in intensity.   Back has not slowed him down ore restricted activity   PAIN:  Are you having pain? Yes: NPRS scale: 8/10 Pain location: low back Pain description: R low back Aggravating factors: twisting  Relieving factors: medication     OBJECTIVE: (objective measures completed at initial evaluation unless otherwise dated)  DIAGNOSTIC FINDINGS:  None   COGNITION:           Overall cognitive status: Within functional limits for tasks assessed                          SENSATION: WFL     POSTURE: decreased lumbar lordosis and anterior pelvic tilt   PALPATION: Bilat QL   LUMBAR ROM:    Active  A/PROM  eval AROM 06/19/22  Flexion 25%  WFL  Extension Pt unable to get to neutral  Lacks 25% from neutral  Right lateral flexion 75%  75%  Left lateral flexion WFL 75%  Right rotation 75%    Left rotation WFL    (Blank rows = not tested)   LOWER EXTREMITY ROM:      Active  Right eval Left eval  Hip flexion WFL with soft tissue restrictions.  WFL with soft tissue restrictions.  Knee flexion 88 in WC, pt states he is unable to go any further.  72 degrees in WC, pt reports restrictions due to past injuries.   Knee extension Lacking 15 degrees in WC Lacking 40 degrees in WC   (Blank rows = not tested)   LOWER EXTREMITY MMT:     MMT Right eval Left eval L 06/19/22  Hip flexion _0 Hip abduction       Knee flexion _1 Knee extension 4 2+ 4+   (Blank rows = not tested)   LUMBAR SPECIAL TESTS:  Unable to assess due to weight restrictions on table at current clinic.    FUNCTIONAL TESTS:  None on eval due to time restrains. Assess next session.    GAIT: Distance walked: Self propelled WC 82f.  Assistive device utilized: Wheelchair (manual) Level of assistance: Complete Independence Comments: Pt self propels in WC, with R LE and Bilat UE's. Upon standing he was unable to stand up straight, but reports FWW being too narrow.        TODAY'S TREATMENT   [facility] Adult PT  Treatment:                                                DATE: 06/26/22 Therapeutic Exercise: Supine SLR 15x B Supine hor abd RTB 15x Supine cane flexion with inspiration 15x Supine alternating shoulder flexion 2# 15/15 Supine curl ups with balck p-ball 15x, exhhale on contraction  Supine hip fallouts GTB 15x B Supine hip fallouts GTB 15x unilateral  Seated trunk flexion/extension placing 5000g ball onto stool and back 15x Seated trunk rotation with 5000g  ball 15/15 hip level  [facility] Adult PT Treatment:                                                DATE: 06/19/22 Therapeutic Exercise: 190f ambulation with SBA and RW Seated ball roll flexion 30s x3 Latissimus pushdowns with inspiration on active press 10x Alternating marching with lat press 10/10 Posterior lean with p-ball elevation 10x Seated trunk rotation with 3000g ball 10/10 hip level Seated trunk rotation with 3000g ball 10/10 shoulder level Seated Vs with 3000g ball 10x Seated trunk flexion/extension placing 3000g ball onto stool and back 15x  [facility] Adult PT Treatment:                                                DATE: 06/12/22 Therapeutic Exercise: Seated ball roll flexion 30s x3 Latissimus pushdowns with inspiration on active press 10x Heel slides with lat press 15/15 Posterior lean with p-ball elevation 10x Seated trunk rotation with 2000g ball 10/10 hip level Seated trunk rotation with 2000g ball 10/10 shoulder level Seated Vs with 2000g ball 10x Standing in walker 30s w/o UE support 5x STS w/o RW support, UEs on knees  Tandem stance in RW with 1 foot on 2 in step 30s x2 each side Seated trunk flexion/extension placing 3000g ball onto stool and back 15x  [facility] Adult PT Treatment:                                                DATE: 06/05/22 Therapeutic Exercise: Seated ball roll flexion 30s x2 forward Seated ball roll flexion 30s x1 lateral Latissimus pushdowns with inspiration on active press 10x 3s hold Seated  trunk rotation with 3000g ball 10/10 Seated hip tosses with 3000g ball 10/10       PATIENT EDUCATION:  Education details: Educated pt on anatomy and physiology of current symptoms, diagnosis, prognosis, HEP,  and POC. Person educated: Patient Education method: ECustomer service managerEducation comprehension: verbalized understanding and returned demonstration     HOME EXERCISE PROGRAM: Access Code: [email or web address]: [email or web address] Date: 05/22/2022 Prepared by: JSharlynn Oliphant Exercises - Seated Abdominal Press into [employer] - 2 x daily - 5 x weekly - 1 sets - 10 reps - 3s hold - Seated Flexion Stretch with Swiss Ball  - 2 x daily - 5 x weekly - 1 sets - 10 reps - 3s hold     ASSESSMENT:   CLINICAL IMPRESSION: Patient maintaining low back pain levels and currently not restricted in activities..  Today's session performed mainly in supine to introduce new tasks as well stress spinal mobility and challenge core muscles.  Transition from supine/sit was fluid demonstrating improving mobility and pain.  Recommend continue OPPT for an additional 4 weeks.   OBJECTIVE IMPAIRMENTS: decreased activity tolerance, difficulty walking, decreased balance, decreased endurance, decreased mobility, decreased ROM, decreased strength, impaired flexibility, impaired LE use, postural dysfunction, and pain.   ACTIVITY LIMITATIONS: bending, lifting, carry, locomotion, cleaning, community activity, driving, and or occupation   PERSONAL FACTORS: Arthritis, DM, DJD are also affecting patient's functional outcome.  REHAB POTENTIAL: Good   CLINICAL DECISION MAKING: Stable/uncomplicated   EVALUATION COMPLEXITY: Low       GOALS: Short term PT Goals Target date: 06/05/2022 Pt will be I and compliant with HEP. Baseline: [id number] Goal status: met   Long term PT goals Target date: 07/03/2022 Pt will improve ROM to [facility] to improve functional mobility Baseline: Goal status:  Patient presents with ROM WFL for tasks required to complete ADLs and mobility, ongoing  Pt will improve hip/knee strength to at least 4+/5 MMT to improve functional strength Baseline: MMT Right eval Left eval L 06/19/22  Hip flexion _0 Hip abduction       Knee flexion _1 Knee extension 4 2+ 4+   Goal status: Met  Pt will reduce pain by overall 50% overall with usual activity Baseline: 06/19/22 Pain described as soreness Goal status: Ongoing  Pt will reduce pain to overall less than 2-3/10 with transfers. Baseline: 06/19/22 No pain with transfers but requires multiple attempts to stand Goal status: Met  Pt will be able to ambulate community distances at least 150 ft with FWW without pain.  Baseline: 06/19/22 184f wirh RW, SBA across level ground Goal status: Ongoing   PLAN: PT FREQUENCY: 1 times per week    PT DURATION: 4 weeks   PLANNED INTERVENTIONS (unless contraindicated): aquatic PT, Canalith repositioning, cryotherapy, Electrical stimulation, Iontophoresis with 4 mg/ml dexamethasome, Moist heat, traction, Ultrasound, gait training, Therapeutic exercise, balance training, neuromuscular re-education, patient/family education, prosthetic training, manual techniques, passive ROM, dry needling, taping, vasopnuematic device, vestibular, spinal manipulations, joint manipulations   PLAN FOR NEXT SESSION: Assess HEP/update PRN, continue to progress functional mobility, strengthen core, proximal hip muscles. Decrease patients pain and help minimize functional deficits, improve gait and balance. Use elevating mat table      JLanice Shirts PT 06/26/2022, 3:54 PM

## 2022-07-01 ENCOUNTER — Ambulatory Visit (INDEPENDENT_AMBULATORY_CARE_PROVIDER_SITE_OTHER): Payer: Medicare PPO | Admitting: Family Medicine

## 2022-07-01 ENCOUNTER — Encounter: Payer: Self-pay | Admitting: Family Medicine

## 2022-07-01 VITALS — BP 124/68 | HR 75 | Temp 98.7°F | Ht 72.0 in | Wt 390.0 lb

## 2022-07-01 DIAGNOSIS — Z0000 Encounter for general adult medical examination without abnormal findings: Secondary | ICD-10-CM

## 2022-07-01 DIAGNOSIS — Z23 Encounter for immunization: Secondary | ICD-10-CM

## 2022-07-01 DIAGNOSIS — E119 Type 2 diabetes mellitus without complications: Secondary | ICD-10-CM | POA: Diagnosis not present

## 2022-07-01 DIAGNOSIS — E785 Hyperlipidemia, unspecified: Secondary | ICD-10-CM | POA: Diagnosis not present

## 2022-07-01 DIAGNOSIS — I10 Essential (primary) hypertension: Secondary | ICD-10-CM | POA: Diagnosis not present

## 2022-07-01 DIAGNOSIS — L84 Corns and callosities: Secondary | ICD-10-CM | POA: Diagnosis not present

## 2022-07-01 DIAGNOSIS — E1169 Type 2 diabetes mellitus with other specified complication: Secondary | ICD-10-CM | POA: Diagnosis not present

## 2022-07-01 DIAGNOSIS — R351 Nocturia: Secondary | ICD-10-CM

## 2022-07-01 NOTE — Patient Instructions (Addendum)
Let us know when you get your next COVID  and Tdap vaccine.  Schedule dentist follow up   Thanks for doing labs If you have mychart- we will send your results within 3 business days of [place] receiving them.  If you do not have mychart- we will call you about results within 5 business days of [place] receiving them.  *please also note that you will see labs on mychart as soon as they post. I will later go in and write notes on them- will say "notes from Dr. Yong Channel"  You are eligible to schedule your annual wellness visit with our nurse specialist Otila Kluver.  Please consider scheduling this before you leave today   Recommended follow up: Return in about 14 weeks (around 10/07/2022) for followup or sooner if needed.Schedule b4 you leave.

## 2022-07-01 NOTE — Progress Notes (Signed)
Phone: [phone]   Subjective:  Patient presents today for their annual physical. Chief complaint-noted.   See problem oriented charting- ROS- full  review of systems was completed and negative  except for: low back pain, leg weskness, occasional lower abdominal pain- no dysuria or polyuria  The following were reviewed and entered/updated in epic: Past Medical History:  Diagnosis Date   Arthritis    "knees" (12/17/2017)   CAD S/P percutaneous coronary angioplasty 09/20/2016   Nstemi 08/2016. Dr. Algie Coffer. DES- brilinta and asa. Requests change to [facility] cardiology; 95% Ramus -> PCI Resolute Onyx DES 2.75 x 18   Central cord syndrome (HCC) 12/17/2017   Chronic combined systolic and diastolic heart failure (HCC) 10/09/2016   EF 45% and grade II diastolic after nstemi   Diet-controlled diabetes mellitus (HCC)    DJD (degenerative joint disease)    Gout    High cholesterol    Hypertension    NSTEMI (non-ST elevated myocardial infarction) (HCC) 09/20/2016   95% Ramus - > PCI    Obesity    Recurrent pulmonary embolism (HCC) 11/'14; 4/'19   a) Bilateral segmental and subsegmental pulmonary emboli with mild RV Strain.; b) after fall with C-spine Fxr --> Acute PE of right main pulmonary artery extending into multiple segments.   Patient Active Problem List   Diagnosis Date Noted   Recurrent pulmonary embolism (HCC)     Priority: High   C4 spinal cord injury, sequela (HCC) 12/18/2017    Priority: High   Tetraplegia (HCC) 12/18/2017    Priority: High   Aortic aneurysm (HCC) 12/16/2017    Priority: High   Cord compression (HCC) 12/13/2017    Priority: High   Chronic combined systolic and diastolic heart failure (HCC) 10/09/2016    Priority: High   CAD S/P percutaneous coronary angioplasty 09/20/2016    Priority: High   Morbid obesity (HCC) 07/18/2013    Priority: High   Diabetes mellitus type 2, controlled (HCC) 07/17/2010    Priority: High   Bilateral lower extremity edema  12/08/2008    Priority: High   Angioedema of lips 07/13/2013    Priority: Medium    BPH with urinary obstruction 07/17/2010    Priority: Medium    Gout 03/15/2007    Priority: Medium    Essential hypertension 03/15/2007    Priority: Medium    Pain of left heel     Priority: Low   Primary osteoarthritis of knees, bilateral 01/11/2016    Priority: Low   Erectile dysfunction 07/30/2015    Priority: Low   Multinodular goiter 07/13/2013    Priority: Low   SOB (shortness of breath) 10/02/2011    Priority: Low   NEUROPATHY, IDIOPATHIC PERIPHERAL NEC 05/31/2007    Priority: Low   Osteoarthritis 03/22/2007    Priority: Low   Preoperative cardiovascular examination 02/05/2021   Past Surgical History:  Procedure Laterality Date   CARDIAC CATHETERIZATION N/A 09/19/2016   Procedure: Left Heart Cath and Coronary Angiography;  Surgeon: Orpah Cobb, MD;  Location: [facility];  Service: Cardiovascular: 95% proximal Ramus Intermedius --> PCI   CARDIAC CATHETERIZATION N/A 09/19/2016   Procedure: Coronary Stent Intervention;  Surgeon: Yvonne Kendall, MD;  Location: [facility] CV [facility];  Service: Cardiovascular: 95% ramus intermedius;  Resolute Onyx 2.75 x 18 mm drug-eluting stent   CYSTOSCOPY/RETROGRADE/URETEROSCOPY/STONE EXTRACTION WITH BASKET  2703,5009   ureteral stone    MENISCUS REPAIR Left    knee open meniscetomy   TRANSTHORACIC ECHOCARDIOGRAM  2004   no lvh  nl ejection fraction   TRANSTHORACIC ECHOCARDIOGRAM  09/19/2016   In setting of NSTEMI:  EF 45-50% with diffuse hypokinesis. GR 2 DD. Mild biatrial enlargement.    Family History  Problem Relation Age of Onset   Liver disease Mother     Medications- reviewed and updated Current Outpatient Medications  Medication Sig Dispense Refill   acetaminophen (TYLENOL) 650 MG CR tablet Take 650-1,300 mg by mouth every 8 (eight) hours as needed for pain.     ammonium lactate (AMLACTIN) 12 % lotion Apply 1 application topically as  needed for dry skin. 400 g 0   aspirin EC 81 MG tablet Take 1 tablet (81 mg total) by mouth daily. (Patient taking differently: Take 81 mg by mouth in the morning.) 90 tablet 3   atorvastatin (LIPITOR) 80 MG tablet TAKE 1 TABLET BY MOUTH DAILY AT 6 PM. 90 tablet 3   Chromium Picolinate (CHROMIUM PICOLATE PO) Take 1 tablet by mouth 3 (three) times a week.     diclofenac sodium (VOLTAREN) 1 % GEL Apply 1 application topically 3 (three) times daily. Both knees (Patient taking differently: Apply 1 application  topically 3 (three) times daily as needed (knee pain).) 3 Tube 4   fluticasone (FLONASE) 50 MCG/ACT nasal spray SPRAY 2 SPRAYS INTO EACH NOSTRIL EVERY DAY 48 mL 1   metoprolol tartrate (LOPRESSOR) 50 MG tablet Take 1 tablet (50 mg total) by mouth 2 (two) times daily. 180 tablet 3   Multiple Vitamin (MULTIVITAMIN WITH MINERALS) TABS tablet Take 1 tablet by mouth daily.     Potassium Citrate 15 MEQ (1620 MG) TBCR Take 1 tablet by mouth 2 (two) times daily.  11   rivaroxaban (XARELTO) 20 MG TABS tablet Take 1 tablet (20 mg total) by mouth daily with supper. 90 tablet 3   terazosin (HYTRIN) 10 MG capsule TAKE 1 CAPSULE BY MOUTH AT BEDTIME. 90 capsule 3   tizanidine (ZANAFLEX) 2 MG capsule Take 1 capsule (2 mg total) by mouth 3 (three) times daily as needed for muscle spasms (low back. failed trial of tablet- has done better in past with capsule.). 30 capsule 2   vitamin C (ASCORBIC ACID) 500 MG tablet Take 500 mg by mouth daily in the afternoon.     No current facility-administered medications for this visit.    Allergies-reviewed and updated Allergies  Allergen Reactions   Ace Inhibitors     Angioedema   Other Hives and Itching    msg    Social History   Social History Narrative   Widowed 02/2020 after over 40 years married.  He has 3 children and 5 grandchildren. They also 5 great grandchildren.       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 Oropharynx normal Neck: no thyromegaly CV: RRR no murmurs rubs or gallops Lungs: CTAB no crackles, wheeze, rhonchi Abdomen: soft/nontender/nondistended/normal bowel sounds. No rebound or guarding.  Ext: no edema Skin: warm, dry Neuro: wheelchair bound, moves all extremities, PERRLA  Diabetic foot exam was performed.  Visual Findings: extensive callous formation particularly at heels- also some hammertoe on left foot- also lower leg with extensive venous stasis skin changes/hardening Posterior tibialis and dorsalis  pulse intact bilaterally.  Intact to touch and monofilament testing bilaterally.     Assessment and Plan  71 y.o. male presenting for annual physical.  Health Maintenance counseling: 1. Anticipatory guidance: Patient counseled regarding regular dental exams - advised q6 months, eye exams -yearly,  avoiding smoking and second hand smoke , limiting alcohol to 2 beverages per day -very rare alcohol - special occasions only bu thasnt used recently, no illicit drugs .   2. Risk factor reduction:  Advised patient of need for regular exercise and diet rich and fruits and vegetables to reduce risk of heart attack and stroke.  Exercise-tries to walk up a ramp daily at home- usually at least once a day other than when back was flared up but back to it.  Diet/weight management-reports reasonably stable at home but has improved diet- more salads/fish/chicken.  Wt Readings from Last 3 Encounters:  07/01/22 (!) 390 lb (176.9 kg)  04/22/22 (!) 390 lb (176.9 kg)  04/14/22 (!) 390 lb (176.9 kg)  3. Immunizations/screenings/ancillary studies-flu shot today, plans on COVID-19 vaccination, discussed option of Prevnar 20- eclines for now, also due for Tdap- recommended at pharmacy   Immunization History  Administered Date(s) Administered   Fluad Quad(high Dose 65+) 07/01/2022   Influenza Split 09/15/2011, 07/15/2012   Influenza Whole 06/25/1998, 05/31/2007, 07/17/2010   Influenza, High Dose Seasonal PF 09/24/2017   Influenza,inj,Quad PF,6+ Mos 07/14/2013, 07/30/2015   PFIZER Comirnaty(Gray Top)Covid-19 Tri-Sucrose Vaccine 09/27/2020, 02/28/2021   PFIZER(Purple Top)SARS-COV-2 Vaccination 01/12/2020, 02/03/2020   Td 08/26/1999, 12/08/2008  4. Prostate cancer screening- follows with urology for kidney stones- did have some blood in urine several months back but cleared on repeat. PSA trend overall low risk for prostate cancer-team ordered this with labs today- once we discussed he opted to cancel- past age 31 not firmly recommended Lab Results  Component Value Date   PSA 1.54 09/21/2020   PSA 1.22 09/24/2017   PSA 1.23 12/26/2014  5. Colon cancer screening - postponed until 2025 per patient request in the past-discussed Cologuard today again - he declines today but is ok with me asking next visit  6. Skin cancer screening- low risk due to melanin content. advised regular sunscreen use. Denies worrisome, changing, or new skin lesions.  7. Smoking associated screening (lung cancer screening, AAA screen 65-75, UA)- former smoker quit 1970s. No aneurysm on CT 07/13/13- no further scan needed  8. STD screening - patient opts out - not dating at moment  Status of chronic or acute concerns   % Cord compression/tetraplegia/sequela of C4 spinal cord injury- patient with severe injury April 2019- has used wheelchair since that time.  Is able to walk with his walker as well. -has electric scooter but has opted not to pursue wheelchair- wants to focus on mobility-there was not one that was the best fit fo him -Follows with Dr. Tessa Lerner now as needed- hasn't seen recently  # Recurrent pulmonary embolism  S: Prior DVT 2014 after traveling- believe led to PE. Recurrent PE 11/2017  after fall. Now on lifelong anticoagulation-Xarelto 20 mg A/P: doing well without worsening recurrence- continue current meds   #Chronic systolic and diastolic heart failure/hypertension S: Compliant lasix 3 days a week, metoprolol 50mg  BID, terazosin 10mg  (also helps with BPH). Takes potassium through urology For CHF- has been stable with Lasix 3 times a week in past- now on just as needed and not really needing.  Reports no increased weight or swelling  BP Readings from Last 3 Encounters:  07/01/22  124/68  04/22/22 128/68  04/14/22 134/70   A/P: CHF Controlled. Continue current medications.  HTN- Controlled. Continue current medications.    #CAD/hyperlipidemia/aortic aneurysm. S: CAD followed by Dr. Herbie Baltimore with history of NSTEMI.  Compliant with aspirin.  Also on Xarelto for recurrent PE.  Dr. Herbie Baltimore follows aortic aneurysm yearly with imagingtypically with most recent check 04/23/2022 on echocardiogram -Also on atorvastatin 80 mg-controlled last year  Lab Results  Component Value Date   CHOL 105 07/04/2021   HDL 35.90 (L) 07/04/2021   LDLCALC 56 07/04/2021   LDLDIRECT 72.0 10/09/2016   TRIG 65.0 07/04/2021   CHOLHDL 3 07/04/2021    A/P: Cholesterol at goal last year-update lipid panel with labs today CAD- no shortness of breath or chest pain- continue current meds  # Diabetes/morbid obesity.  S:  controlled on no rx previously but a1c has trended up- he has preferred goal 8 or less Lab Results  Component Value Date   HGBA1C 7.9 (H) 10/18/2021   HGBA1C 8.6 (H) 07/04/2021   HGBA1C 7.1 (H) 09/21/2020  A/P: a1c slightly high- update today- he agrees if over 8 to consider jardiance- preference over metformin plus would get CHF and CAD benefit   #Chronic back pain- improved with PT- still needs 4 additional sessions and I told Gustavus Bryant, PT ok on verbal orders. Occassionally pain into lateral abdomen in the morning and gets better as day goes on.  -tizanidine helps- I'm ok  with refill if needed but for now has at least one more refill available  #extensive callous formation on heels- will refer to podiatry to see if this can be pared down- encouraged to use prior cream until visit- maybe even gently use pumice stone  Recommended follow up: Return in about 14 weeks (around 10/07/2022) for followup or sooner if needed.Schedule b4 you leave. Future Appointments  Date Time Provider Department Center  07/03/2022  1:15 PM [facility]  07/07/2022 10:15 AM [facility]   Lab/Order associations:NOT fasting- breakfast sandwich this morning sausage and egg at 11   ICD-10-CM   1. Preventative health care  Z00.00 Microalbumin / creatinine urine ratio    CBC with Differential/Platelet    Comprehensive metabolic panel    Lipid panel    Hemoglobin A1c    CANCELED: PSA    2. Controlled type 2 diabetes mellitus without complication, without long-term current use of insulin (HCC)  E11.9 Hemoglobin A1c    3. Essential hypertension  I10 CBC with Differential/Platelet    Comprehensive metabolic panel    4. Hyperlipidemia associated with type 2 diabetes mellitus (HCC)  E11.69 Lipid panel   E78.5     5. Nocturia  R35.1 CANCELED: PSA    6. Need for immunization against influenza  Z23 Flu Vaccine QUAD High Dose(Fluad)    7. Pre-ulcerative corn or callous  L84 Ambulatory referral to Podiatry     No orders of the defined types were placed in this encounter.  Return precautions advised.  Tana Conch, MD

## 2022-07-02 LAB — CBC WITH DIFFERENTIAL/PLATELET
Basophils Absolute: 0 10*3/uL (ref 0.0–0.1)
Basophils Relative: 0.4 % (ref 0.0–3.0)
Eosinophils Absolute: 0.2 10*3/uL (ref 0.0–0.7)
Eosinophils Relative: 2.8 % (ref 0.0–5.0)
HCT: 37.9 % — ABNORMAL LOW (ref 39.0–52.0)
Hemoglobin: 12.4 g/dL — ABNORMAL LOW (ref 13.0–17.0)
Lymphocytes Relative: 22.3 % (ref 12.0–46.0)
Lymphs Abs: 1.7 10*3/uL (ref 0.7–4.0)
MCHC: 32.8 g/dL (ref 30.0–36.0)
MCV: 91 fl (ref 78.0–100.0)
Monocytes Absolute: 0.6 10*3/uL (ref 0.1–1.0)
Monocytes Relative: 8 % (ref 3.0–12.0)
Neutro Abs: 5.2 10*3/uL (ref 1.4–7.7)
Neutrophils Relative %: 66.5 % (ref 43.0–77.0)
Platelets: 239 10*3/uL (ref 150.0–400.0)
RBC: 4.16 Mil/uL — ABNORMAL LOW (ref 4.22–5.81)
RDW: 14.7 % (ref 11.5–15.5)
WBC: 7.8 10*3/uL (ref 4.0–10.5)

## 2022-07-02 LAB — COMPREHENSIVE METABOLIC PANEL
ALT: 19 U/L (ref 0–53)
AST: 19 U/L (ref 0–37)
Albumin: 3.4 g/dL — ABNORMAL LOW (ref 3.5–5.2)
Alkaline Phosphatase: 104 U/L (ref 39–117)
BUN: 23 mg/dL (ref 6–23)
CO2: 28 mEq/L (ref 19–32)
Calcium: 8.5 mg/dL (ref 8.4–10.5)
Chloride: 101 mEq/L (ref 96–112)
Creatinine, Ser: 1.29 mg/dL (ref 0.40–1.50)
GFR: 55.2 mL/min — ABNORMAL LOW (ref >60.00)
Glucose, Bld: 156 mg/dL — ABNORMAL HIGH (ref 70–99)
Potassium: 4.3 mEq/L (ref 3.5–5.1)
Sodium: 136 mEq/L (ref 135–145)
Total Bilirubin: 0.4 mg/dL (ref 0.2–1.2)
Total Protein: 6.9 g/dL (ref 6.0–8.3)

## 2022-07-02 LAB — LIPID PANEL
Cholesterol: 103 mg/dL (ref 0–200)
HDL: 36.7 mg/dL — ABNORMAL LOW (ref >39.00)
LDL Cholesterol: 52 mg/dL (ref 0–99)
NonHDL: 66.32
Total CHOL/HDL Ratio: 3
Triglycerides: 71 mg/dL (ref 0.0–149.0)
VLDL: 14.2 mg/dL (ref 0.0–40.0)

## 2022-07-02 LAB — MICROALBUMIN / CREATININE URINE RATIO
Creatinine,U: 109.2 mg/dL
Microalb Creat Ratio: 0.6 mg/g (ref 0.0–30.0)
Microalb, Ur: <0.7 mg/dL (ref 0.0–1.9)

## 2022-07-02 LAB — HEMOGLOBIN A1C: Hgb A1c MFr Bld: 8.3 % — ABNORMAL HIGH (ref 4.6–6.5)

## 2022-07-03 ENCOUNTER — Ambulatory Visit: Payer: Medicare PPO

## 2022-07-03 DIAGNOSIS — M5459 Other low back pain: Secondary | ICD-10-CM | POA: Diagnosis not present

## 2022-07-03 DIAGNOSIS — R531 Weakness: Secondary | ICD-10-CM

## 2022-07-03 DIAGNOSIS — R2681 Unsteadiness on feet: Secondary | ICD-10-CM | POA: Diagnosis not present

## 2022-07-03 MED ORDER — EMPAGLIFLOZIN 10 MG PO TABS: 10.0000 mg | ORAL_TABLET | Freq: Every day | ORAL | 5 refills | Status: DC

## 2022-07-03 NOTE — Addendum Note (Signed)
Addended by: Leda Quail on: 07/03/2022 09:11 AM   Modules accepted: Orders

## 2022-07-03 NOTE — Therapy (Signed)
OUTPATIENT PHYSICAL THERAPY TREATMENT NOTE   Patient Name: Franklin Hicks MRN: [id number] DOB:01/13/1951, 71 y.o., male Today's Date: 07/03/2022  PCP: Marin Olp, MD REFERRING PROVIDER: Marin Olp, MD  END OF SESSION:   PT End of Session - 07/03/22 1317     Visit Number 9    Number of Visits 13    Date for PT Re-Evaluation 07/10/22    Authorization Type [employer] MEDICARE CHOICE PPO    PT Start Time 1315    PT Stop Time 5284    PT Time Calculation (min) 40 min    Activity Tolerance Patient tolerated treatment well    Behavior During Therapy [facility] for tasks assessed/performed                Past Medical History:  Diagnosis Date   Arthritis    "knees" (12/17/2017)   CAD S/P percutaneous coronary angioplasty 09/20/2016   Nstemi 08/2016. Dr. Doylene Canard. DES- brilinta and asa. Requests change to [facility] cardiology; 95% Ramus -> PCI Resolute Onyx DES 2.75 x 18   Central cord syndrome ([place]) 12/17/2017   Chronic combined systolic and diastolic heart failure (HCC) 10/09/2016   EF 45% and grade II diastolic after nstemi   Diet-controlled diabetes mellitus (HCC)    DJD (degenerative joint disease)    Gout    High cholesterol    Hypertension    NSTEMI (non-ST elevated myocardial infarction) ([place]) 09/20/2016   95% Ramus - > PCI    Obesity    Recurrent pulmonary embolism ([place]) 11/'14; 4/'19   a) Bilateral segmental and subsegmental pulmonary emboli with mild RV Strain.; b) after fall with C-spine Fxr --> Acute PE of right main pulmonary artery extending into multiple segments.   Past Surgical History:  Procedure Laterality Date   CARDIAC CATHETERIZATION N/A 09/19/2016   Procedure: Left Heart Cath and Coronary Angiography;  Surgeon: Dixie Dials, MD;  Location: [facility];  Service: Cardiovascular: 95% proximal Ramus Intermedius --> PCI   CARDIAC CATHETERIZATION N/A 09/19/2016   Procedure: Coronary Stent Intervention;  Surgeon: Nelva Bush, MD;  Location: [facility];  Service: Cardiovascular: 95% ramus intermedius;  Resolute Onyx 2.75 x 18 mm drug-eluting stent   CYSTOSCOPY/RETROGRADE/URETEROSCOPY/STONE EXTRACTION WITH BASKET  1324,4010   ureteral stone    MENISCUS REPAIR Left    knee open meniscetomy   TRANSTHORACIC ECHOCARDIOGRAM  2004   no lvh nl ejection fraction   TRANSTHORACIC ECHOCARDIOGRAM  09/19/2016   In setting of NSTEMI:  EF 45-50% with diffuse hypokinesis. GR 2 DD. Mild biatrial enlargement.   Patient Active Problem List   Diagnosis Date Noted   Preoperative cardiovascular examination 02/05/2021   Recurrent pulmonary embolism (HCC)    Pain of left heel    C4 spinal cord injury, sequela ([place]) 12/18/2017   Tetraplegia ([place]) 12/18/2017   Aortic aneurysm ([place]) 12/16/2017   Cord compression ([place]) 12/13/2017   Chronic combined systolic and diastolic heart failure ([place]) 10/09/2016   CAD S/P percutaneous coronary angioplasty 09/20/2016   Primary osteoarthritis of knees, bilateral 01/11/2016   Erectile dysfunction 07/30/2015   Morbid obesity ([place]) 07/18/2013   Angioedema of lips 07/13/2013   Multinodular goiter 07/13/2013   SOB (shortness of breath) 10/02/2011   BPH with urinary obstruction 07/17/2010   Diabetes mellitus type 2, controlled ([place]) 07/17/2010   Bilateral lower extremity edema 12/08/2008   NEUROPATHY, IDIOPATHIC PERIPHERAL NEC 05/31/2007   Osteoarthritis 03/22/2007   Gout 03/15/2007   Essential hypertension 03/15/2007    REFERRING  DIAG: Chronic bilateral low back pain without sciatica [M54.50, G89.29]  THERAPY DIAG: Difficulty in walking, not elsewhere classified   Muscle weakness (generalized)   Other low back pain   Other lack of coordination   Other abnormalities of gait and mobility   Rationale for Evaluation and Treatment Rehabilitation  PERTINENT HISTORY: Arthritis, DM, DJD  PRECAUTIONS: fall and transfers  SUBJECTIVE: Saw MD, progressing and VO received for an additional 1w4 OPPT visits. Back  pain intensity down to a "twinge"   PAIN:  Are you having pain? Yes: NPRS scale: 8/10 Pain location: low back Pain description: R low back Aggravating factors: twisting  Relieving factors: medication     OBJECTIVE: (objective measures completed at initial evaluation unless otherwise dated)  DIAGNOSTIC FINDINGS:  None   COGNITION:           Overall cognitive status: Within functional limits for tasks assessed                          SENSATION: WFL     POSTURE: decreased lumbar lordosis and anterior pelvic tilt   PALPATION: Bilat QL   LUMBAR ROM:    Active  A/PROM  eval AROM 06/19/22  Flexion 25%  WFL  Extension Pt unable to get to neutral  Lacks 25% from neutral  Right lateral flexion 75%  75%  Left lateral flexion WFL 75%  Right rotation 75%    Left rotation WFL    (Blank rows = not tested)   LOWER EXTREMITY ROM:      Active  Right eval Left eval  Hip flexion WFL with soft tissue restrictions.  WFL with soft tissue restrictions.  Knee flexion 88 in WC, pt states he is unable to go any further.  72 degrees in WC, pt reports restrictions due to past injuries.   Knee extension Lacking 15 degrees in WC Lacking 40 degrees in WC   (Blank rows = not tested)   LOWER EXTREMITY MMT:     MMT Right eval Left eval L 06/19/22  Hip flexion _0 Hip abduction       Knee flexion _1 Knee extension 4 2+ 4+   (Blank rows = not tested)   LUMBAR SPECIAL TESTS:  Unable to assess due to weight restrictions on table at current clinic.    FUNCTIONAL TESTS:  None on eval due to time restrains. Assess next session.    GAIT: Distance walked: Self propelled WC 60f.  Assistive device utilized: Wheelchair (manual) Level of assistance: Complete Independence Comments: Pt self propels in WC, with R LE and Bilat UE's. Upon standing he was unable to stand up straight, but reports FWW being too narrow.        TODAY'S TREATMENT  [facility] Adult PT Treatment:                                                 DATE: 07/03/22 Therapeutic Exercise: Supine SLR 15x B 2# Supine march alternating 15/15 2# Supine hor abd RTB 15x Supine cane flexion with inspiration 15x Supine alternating shoulder flexion 2# 15/15 Supine curl ups with balck p-ball 15x, exhhale on contraction 3s hold Supine hip fallouts GTB 15x B Supine hip fallouts GTB 15x unilateral  Seated trunk flexion against red band 15x Nustep L4 8 min  [facility] Adult PT Treatment:                                                DATE: 06/26/22 Therapeutic Exercise: Supine SLR 15x B Supine hor abd RTB 15x Supine cane flexion with inspiration 15x Supine alternating shoulder flexion 2# 15/15 Supine curl ups with balck p-ball 15x, exhhale on contraction  Supine hip fallouts GTB 15x B Supine hip fallouts GTB 15x unilateral  Seated trunk flexion/extension placing 5000g ball onto stool and back 15x Seated trunk rotation with 5000g ball 15/15 hip level  [facility] Adult PT Treatment:                                                DATE: 06/19/22 Therapeutic Exercise: 126f ambulation with SBA and RW Seated ball roll flexion 30s x3 Latissimus pushdowns with inspiration on active press 10x Alternating marching with lat press 10/10 Posterior lean with p-ball elevation 10x Seated trunk rotation with 3000g ball 10/10 hip level Seated trunk rotation with 3000g ball 10/10 shoulder level Seated Vs with 3000g ball 10x Seated trunk flexion/extension placing 3000g ball onto stool and back 15x  [facility] Adult PT Treatment:                                                DATE: 06/12/22 Therapeutic Exercise: Seated ball roll flexion 30s x3 Latissimus pushdowns with inspiration on active press 10x Heel slides with lat press 15/15 Posterior lean with p-ball elevation 10x Seated trunk rotation with 2000g ball 10/10 hip level Seated trunk rotation with 2000g ball 10/10 shoulder level Seated Vs with 2000g ball 10x Standing in walker 30s w/o  UE support 5x STS w/o RW support, UEs on knees  Tandem stance in RW with 1 foot on 2 in step 30s x2 each side Seated trunk flexion/extension placing 3000g ball onto stool and back 15x  [facility] Adult PT Treatment:                                                DATE: 06/05/22 Therapeutic Exercise: Seated ball roll flexion 30s x2 forward Seated ball roll flexion 30s x1 lateral Latissimus pushdowns with inspiration on active press 10x 3s hold Seated trunk rotation with 3000g ball 10/10 Seated hip tosses with 3000g ball 10/10       PATIENT EDUCATION:  Education details: Educated pt on anatomy and physiology of current symptoms, diagnosis, prognosis, HEP,  and POC. Person educated: Patient Education method: ECustomer service managerEducation comprehension: verbalized understanding and returned demonstration     HOME EXERCISE PROGRAM: Access Code: [email or web address]: [email or web address] Date: 05/22/2022 Prepared by: JSharlynn Oliphant Exercises - Seated Abdominal Press into [employer] - 2 x daily - 5 x weekly - 1 sets - 10 reps - 3s hold - Seated Flexion Stretch with Swiss Ball  - 2 x daily - 5 x weekly - 1 sets - 10 reps - 3s hold  ASSESSMENT:   CLINICAL IMPRESSION: Pain levels remain low, described as a "twinge". Today's session added resistance and difficulty as well as more tasks to increase core strength.   OBJECTIVE IMPAIRMENTS: decreased activity tolerance, difficulty walking, decreased balance, decreased endurance, decreased mobility, decreased ROM, decreased strength, impaired flexibility, impaired LE use, postural dysfunction, and pain.   ACTIVITY LIMITATIONS: bending, lifting, carry, locomotion, cleaning, community activity, driving, and or occupation   PERSONAL FACTORS: Arthritis, DM, DJD are also affecting patient's functional outcome.   REHAB POTENTIAL: Good   CLINICAL DECISION MAKING: Stable/uncomplicated   EVALUATION COMPLEXITY: Low        GOALS: Short term PT Goals Target date: 06/05/2022 Pt will be I and compliant with HEP. Baseline: [id number] Goal status: met   Long term PT goals Target date: 07/03/2022 Pt will improve ROM to [facility] to improve functional mobility Baseline: Goal status: Patient presents with ROM WFL for tasks required to complete ADLs and mobility, ongoing  Pt will improve hip/knee strength to at least 4+/5 MMT to improve functional strength Baseline: MMT Right eval Left eval L 06/19/22  Hip flexion _0 Hip abduction       Knee flexion _1 Knee extension 4 2+ 4+   Goal status: Met  Pt will reduce pain by overall 50% overall with usual activity Baseline: 06/19/22 Pain described as soreness Goal status: Ongoing  Pt will reduce pain to overall less than 2-3/10 with transfers. Baseline: 06/19/22 No pain with transfers but requires multiple attempts to stand Goal status: Met  Pt will be able to ambulate community distances at least 150 ft with FWW without pain.  Baseline: 06/19/22 166f wirh RW, SBA across level ground Goal status: Ongoing   PLAN: PT FREQUENCY: 1 times per week    PT DURATION: 4 weeks   PLANNED INTERVENTIONS (unless contraindicated): aquatic PT, Canalith repositioning, cryotherapy, Electrical stimulation, Iontophoresis with 4 mg/ml dexamethasome, Moist heat, traction, Ultrasound, gait training, Therapeutic exercise, balance training, neuromuscular re-education, patient/family education, prosthetic training, manual techniques, passive ROM, dry needling, taping, vasopnuematic device, vestibular, spinal manipulations, joint manipulations   PLAN FOR NEXT SESSION: Assess HEP/update PRN, continue to progress functional mobility, strengthen core, proximal hip muscles. Decrease patients pain and help minimize functional deficits, improve gait and balance. Use elevating mat table      JLanice Shirts PT 07/03/2022, 1:29 PM

## 2022-07-07 ENCOUNTER — Ambulatory Visit (INDEPENDENT_AMBULATORY_CARE_PROVIDER_SITE_OTHER): Payer: Medicare PPO

## 2022-07-07 DIAGNOSIS — Z713 Dietary counseling and surveillance: Secondary | ICD-10-CM

## 2022-07-07 DIAGNOSIS — Z0000 Encounter for general adult medical examination without abnormal findings: Secondary | ICD-10-CM | POA: Diagnosis not present

## 2022-07-07 NOTE — Patient Instructions (Signed)
[name] , Thank you for taking time to come for your Medicare Wellness Visit. I appreciate your ongoing commitment to your health goals. Please review the following plan we discussed and let me know if I can assist you in the future.   These are the goals we discussed:  Goals      Patient Stated     Goal is to be independent again      Patient Stated     Lose weight      Patient Stated     Lose weight         This is a list of the screening recommended for you and due dates:  Health Maintenance  Topic Date Due   COVID-19 Vaccine (5 - Pfizer series) 07/17/2022*   Zoster (Shingles) Vaccine (1 of 2) 07/23/2022*   Pneumonia Vaccine (1 - PCV) 10/18/2022*   Tetanus Vaccine  10/18/2022*   Colon Cancer Screening  12/22/2023*   Eye exam for diabetics  11/29/2022   Hemoglobin A1C  12/30/2022   Yearly kidney function blood test for diabetes  07/02/2023   Yearly kidney health urinalysis for diabetes  07/02/2023   Complete foot exam   07/02/2023   Medicare Annual Wellness Visit  07/08/2023   Flu Shot  Completed   Hepatitis C Screening: USPSTF Recommendation to screen - Ages 18-79 yo.  Completed   HPV Vaccine  Aged Out  *Topic was postponed. The date shown is not the original due date.    Advanced directives: Please bring a copy of your health care power of attorney and living will to the office at your convenience.  Conditions/risks identified: none at this time   Next appointment: Follow up in one year for your annual wellness visit.   Preventive Care 73 Years and Older, Male  Preventive care refers to lifestyle choices and visits with your health care provider that can promote health and wellness. What does preventive care include? A yearly physical exam. This is also called an annual well check. Dental exams once or twice a year. Routine eye exams. Ask your health care provider how often you should have your eyes checked. Personal lifestyle choices, including: Daily care of  your teeth and gums. Regular physical activity. Eating a healthy diet. Avoiding tobacco and drug use. Limiting alcohol use. Practicing safe sex. Taking low doses of aspirin every day. Taking vitamin and mineral supplements as recommended by your health care provider. What happens during an annual well check? The services and screenings done by your health care provider during your annual well check will depend on your age, overall health, lifestyle risk factors, and family history of disease. Counseling  Your health care provider may ask you questions about your: Alcohol use. Tobacco use. Drug use. Emotional well-being. Home and relationship well-being. Sexual activity. Eating habits. History of falls. Memory and ability to understand (cognition). Work and work Astronomer. Screening  You may have the following tests or measurements: Height, weight, and BMI. Blood pressure. Lipid and cholesterol levels. These may be checked every 5 years, or more frequently if you are over 73 years old. Skin check. Lung cancer screening. You may have this screening every year starting at age 73 if you have a 30-pack-year history of smoking and currently smoke or have quit within the past 15 years. Fecal occult blood test (FOBT) of the stool. You may have this test every year starting at age 73. Flexible sigmoidoscopy or colonoscopy. You may have a sigmoidoscopy every 5 years  or a colonoscopy every 10 years starting at age 73. Prostate cancer screening. Recommendations will vary depending on your family history and other risks. Hepatitis C blood test. Hepatitis B blood test. Sexually transmitted disease (STD) testing. Diabetes screening. This is done by checking your blood sugar (glucose) after you have not eaten for a while (fasting). You may have this done every 1-3 years. Abdominal aortic aneurysm (AAA) screening. You may need this if you are a current or former smoker. Osteoporosis. You may be  screened starting at age 73 if you are at high risk. Talk with your health care provider about your test results, treatment options, and if necessary, the need for more tests. Vaccines  Your health care provider may recommend certain vaccines, such as: Influenza vaccine. This is recommended every year. Tetanus, diphtheria, and acellular pertussis (Tdap, Td) vaccine. You may need a Td booster every 10 years. Zoster vaccine. You may need this after age 73. Pneumococcal 13-valent conjugate (PCV13) vaccine. One dose is recommended after age 73. Pneumococcal polysaccharide (PPSV23) vaccine. One dose is recommended after age 73. Talk to your health care provider about which screenings and vaccines you need and how often you need them. This information is not intended to replace advice given to you by your health care provider. Make sure you discuss any questions you have with your health care provider. Document Released: 09/07/2015 Document Revised: 04/30/2016 Document Reviewed: 06/12/2015 Elsevier Interactive Patient Education  2017 [employer].  Fall Prevention in the Home Falls can cause injuries. They can happen to people of all ages. There are many things you can do to make your home safe and to help prevent falls. What can I do on the outside of my home? Regularly fix the edges of walkways and driveways and fix any cracks. Remove anything that might make you trip as you walk through a door, such as a raised step or threshold. Trim any bushes or trees on the path to your home. Use bright outdoor lighting. Clear any walking paths of anything that might make someone trip, such as rocks or tools. Regularly check to see if handrails are loose or broken. Make sure that both sides of any steps have handrails. Any raised decks and porches should have guardrails on the edges. Have any leaves, snow, or ice cleared regularly. Use sand or salt on walking paths during winter. Clean up any spills in  your garage right away. This includes oil or grease spills. What can I do in the bathroom? Use night lights. Install grab bars by the toilet and in the tub and shower. Do not use towel bars as grab bars. Use non-skid mats or decals in the tub or shower. If you need to sit down in the shower, use a plastic, non-slip stool. Keep the floor dry. Clean up any water that spills on the floor as soon as it happens. Remove soap buildup in the tub or shower regularly. Attach bath mats securely with double-sided non-slip rug tape. Do not have throw rugs and other things on the floor that can make you trip. What can I do in the bedroom? Use night lights. Make sure that you have a light by your bed that is easy to reach. Do not use any sheets or blankets that are too big for your bed. They should not hang down onto the floor. Have a firm chair that has side arms. You can use this for support while you get dressed. Do not have throw rugs and  other things on the floor that can make you trip. What can I do in the kitchen? Clean up any spills right away. Avoid walking on wet floors. Keep items that you use a lot in easy-to-reach places. If you need to reach something above you, use a strong step stool that has a grab bar. Keep electrical cords out of the way. Do not use floor polish or wax that makes floors slippery. If you must use wax, use non-skid floor wax. Do not have throw rugs and other things on the floor that can make you trip. What can I do with my stairs? Do not leave any items on the stairs. Make sure that there are handrails on both sides of the stairs and use them. Fix handrails that are broken or loose. Make sure that handrails are as long as the stairways. Check any carpeting to make sure that it is firmly attached to the stairs. Fix any carpet that is loose or worn. Avoid having throw rugs at the top or bottom of the stairs. If you do have throw rugs, attach them to the floor with carpet  tape. Make sure that you have a light switch at the top of the stairs and the bottom of the stairs. If you do not have them, ask someone to add them for you. What else can I do to help prevent falls? Wear shoes that: Do not have high heels. Have rubber bottoms. Are comfortable and fit you well. Are closed at the toe. Do not wear sandals. If you use a stepladder: Make sure that it is fully opened. Do not climb a closed stepladder. Make sure that both sides of the stepladder are locked into place. Ask someone to hold it for you, if possible. Clearly mark and make sure that you can see: Any grab bars or handrails. First and last steps. Where the edge of each step is. Use tools that help you move around (mobility aids) if they are needed. These include: Canes. Walkers. Scooters. Crutches. Turn on the lights when you go into a dark area. Replace any light bulbs as soon as they burn out. Set up your furniture so you have a clear path. Avoid moving your furniture around. If any of your floors are uneven, fix them. If there are any pets around you, be aware of where they are. Review your medicines with your doctor. Some medicines can make you feel dizzy. This can increase your chance of falling. Ask your doctor what other things that you can do to help prevent falls. This information is not intended to replace advice given to you by your health care provider. Make sure you discuss any questions you have with your health care provider. Document Released: 06/07/2009 Document Revised: 01/17/2016 Document Reviewed: 09/15/2014 Elsevier Interactive Patient Education  2017 [employer].

## 2022-07-07 NOTE — Progress Notes (Signed)
I connected with  Franklin Hicks on 07/07/22 by a audio enabled telemedicine application and verified that I am speaking with the correct person using two identifiers.  Patient Location: Home  Provider Location: Office/Clinic  I discussed the limitations of evaluation and management by telemedicine. The patient expressed understanding and agreed to proceed.   Subjective:   Franklin Hicks is a 71 y.o. male who presents for Medicare Annual/Subsequent preventive examination.  Review of Systems     Cardiac Risk Factors include: advanced age (>25men, >79 women);hypertension;male gender;diabetes mellitus;obesity (BMI >30kg/m2)     Objective:    There were no vitals filed for this visit. There is no height or weight on file to calculate BMI.     07/07/2022   10:11 AM 05/08/2022   10:25 AM 06/20/2021   10:22 AM 02/18/2021   11:45 AM 01/30/2021   10:21 AM 12/22/2019    2:13 PM 07/01/2018    3:46 PM  Advanced Directives  Does Patient Have a Medical Advance Directive? No No Yes No No No No  Does patient want to make changes to medical advance directive?   Yes (MAU/Ambulatory/Procedural Areas - Information given)      Would patient like information on creating a medical advance directive? No - Patient declined   No - Patient declined No - Patient declined Yes (MAU/Ambulatory/Procedural Areas - Information given)     Current Medications (verified) Outpatient Encounter Medications as of 07/07/2022  Medication Sig   acetaminophen (TYLENOL) 650 MG CR tablet Take 650-1,300 mg by mouth every 8 (eight) hours as needed for pain.   ammonium lactate (AMLACTIN) 12 % lotion Apply 1 application topically as needed for dry skin.   aspirin EC 81 MG tablet Take 1 tablet (81 mg total) by mouth daily. (Patient taking differently: Take 81 mg by mouth in the morning.)   atorvastatin (LIPITOR) 80 MG tablet TAKE 1 TABLET BY MOUTH DAILY AT 6 PM.   Chromium Picolinate (CHROMIUM PICOLATE PO) Take 1 tablet by mouth 3  (three) times a week.   diclofenac sodium (VOLTAREN) 1 % GEL Apply 1 application topically 3 (three) times daily. Both knees (Patient taking differently: Apply 1 application  topically 3 (three) times daily as needed (knee pain).)   fluticasone (FLONASE) 50 MCG/ACT nasal spray SPRAY 2 SPRAYS INTO EACH NOSTRIL EVERY DAY   metoprolol tartrate (LOPRESSOR) 50 MG tablet Take 1 tablet (50 mg total) by mouth 2 (two) times daily.   Multiple Vitamin (MULTIVITAMIN WITH MINERALS) TABS tablet Take 1 tablet by mouth daily.   Potassium Citrate 15 MEQ (1620 MG) TBCR Take 1 tablet by mouth 2 (two) times daily.   rivaroxaban (XARELTO) 20 MG TABS tablet Take 1 tablet (20 mg total) by mouth daily with supper.   terazosin (HYTRIN) 10 MG capsule TAKE 1 CAPSULE BY MOUTH AT BEDTIME.   tizanidine (ZANAFLEX) 2 MG capsule Take 1 capsule (2 mg total) by mouth 3 (three) times daily as needed for muscle spasms (low back. failed trial of tablet- has done better in past with capsule.).   vitamin C (ASCORBIC ACID) 500 MG tablet Take 500 mg by mouth daily in the afternoon.   empagliflozin (JARDIANCE) 10 MG TABS tablet Take 1 tablet (10 mg total) by mouth daily before breakfast. (Patient not taking: Reported on 07/07/2022)   No facility-administered encounter medications on file as of 07/07/2022.    Allergies (verified) Ace inhibitors and Other   History: Past Medical History:  Diagnosis Date   Arthritis    "  knees" (12/17/2017)   CAD S/P percutaneous coronary angioplasty 09/20/2016   Nstemi 08/2016. Dr. Algie Coffer. DES- brilinta and asa. Requests change to [facility] cardiology; 95% Ramus -> PCI Resolute Onyx DES 2.75 x 18   Central cord syndrome (HCC) 12/17/2017   Chronic combined systolic and diastolic heart failure (HCC) 10/09/2016   EF 45% and grade II diastolic after nstemi   Diet-controlled diabetes mellitus (HCC)    DJD (degenerative joint disease)    Gout    High cholesterol    Hypertension    NSTEMI (non-ST elevated  myocardial infarction) (HCC) 09/20/2016   95% Ramus - > PCI    Obesity    Recurrent pulmonary embolism (HCC) 11/'14; 4/'19   a) Bilateral segmental and subsegmental pulmonary emboli with mild RV Strain.; b) after fall with C-spine Fxr --> Acute PE of right main pulmonary artery extending into multiple segments.   Past Surgical History:  Procedure Laterality Date   CARDIAC CATHETERIZATION N/A 09/19/2016   Procedure: Left Heart Cath and Coronary Angiography;  Surgeon: Orpah Cobb, MD;  Location: [facility] INVASIVE CV LAB;  Service: Cardiovascular: 95% proximal Ramus Intermedius --> PCI   CARDIAC CATHETERIZATION N/A 09/19/2016   Procedure: Coronary Stent Intervention;  Surgeon: Yvonne Kendall, MD;  Location: [facility] INVASIVE CV LAB;  Service: Cardiovascular: 95% ramus intermedius;  Resolute Onyx 2.75 x 18 mm drug-eluting stent   CYSTOSCOPY/RETROGRADE/URETEROSCOPY/STONE EXTRACTION WITH BASKET  6286,3817   ureteral stone    MENISCUS REPAIR Left    knee open meniscetomy   TRANSTHORACIC ECHOCARDIOGRAM  2004   no lvh nl ejection fraction   TRANSTHORACIC ECHOCARDIOGRAM  09/19/2016   In setting of NSTEMI:  EF 45-50% with diffuse hypokinesis. GR 2 DD. Mild biatrial enlargement.   Family History  Problem Relation Age of Onset   Liver disease Mother    Social History   Socioeconomic History   Marital status: Widowed    Spouse name: Not on file   Number of children: Not on file   Years of education: Not on file   Highest education level: Not on file  Occupational History   Occupation: teacher  Tobacco Use   Smoking status: Former    Packs/day: 1.00    Years: 13.00    Total pack years: 13.00    Types: Cigarettes    Quit date: 07/13/1977    Years since quitting: 45.0   Smokeless tobacco: Never  Vaping Use   Vaping Use: Never used  Substance and Sexual Activity   Alcohol use: Yes    Alcohol/week: 0.0 standard drinks of alcohol    Comment: holidays only   Drug use: No   Sexual activity: Not on  file  Other Topics Concern   Not on file  Social History Narrative   Widowed 02/2020 after over 40 years married.  He has 3 children and 5 grandchildren. They also 5 great grandchildren.       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   Social Determinants of Health   Financial Resource Strain: Low Risk  (07/07/2022)   Overall Financial Resource Strain (CARDIA)    Difficulty of Paying Living Expenses: Not hard at all  Food Insecurity: No Food Insecurity (07/07/2022)   Hunger Vital Sign    Worried About Running Out of Food in the Last Year: Never true    Ran Out of Food in the Last Year: Never true  Transportation Needs: No Transportation Needs (07/07/2022)   PRAPARE - Transportation    Lack of  Transportation (Medical): No    Lack of Transportation (Non-Medical): No  Physical Activity: Inactive (07/07/2022)   Exercise Vital Sign    Days of Exercise per Week: 0 days    Minutes of Exercise per Session: 0 min  Stress: No Stress Concern Present (07/07/2022)   [employer] of Occupational Health - Occupational Stress Questionnaire    Feeling of Stress : Not at all  Social Connections: Moderately Isolated (07/07/2022)   Social Connection and Isolation Panel [NHANES]    Frequency of Communication with Friends and Family: More than three times a week    Frequency of Social Gatherings with Friends and Family: More than three times a week    Attends Religious Services: More than 4 times per year    Active Member of [employer] or Organizations: No    Attends Banker Meetings: Never    Marital Status: Widowed    Tobacco Counseling Counseling given: Not Answered   Clinical Intake:  Pre-visit preparation completed: Yes  Pain : No/denies pain     BMI - recorded: 52.89 Nutritional Status: BMI > 30  Obese Nutritional Risks: None Diabetes: Yes CBG done?: No Did pt. bring in CBG monitor from home?: No  How often do you need to have  someone help you when you read instructions, pamphlets, or other written materials from your doctor or pharmacy?: 1 - Never  Diabetic?Nutrition Risk Assessment:  Has the patient had any N/V/D within the last 2 months?  No  Does the patient have any non-healing wounds?  No  Has the patient had any unintentional weight loss or weight gain?  No   Diabetes:  Is the patient diabetic?  Yes  If diabetic, was a CBG obtained today?  No  Did the patient bring in their glucometer from home?  No  How often do you monitor your CBG's? As Needed .   Financial Strains and Diabetes Management:  Are you having any financial strains with the device, your supplies or your medication? No .  Does the patient want to be seen by Chronic Care Management for management of their diabetes?  No  Would the patient like to be referred to a Nutritionist or for Diabetic Management?  Yes   Diabetic Exams:  Diabetic Eye Exam: Completed 11/28/21 Diabetic Foot Exam: Completed 07/01/22   Interpreter Needed?: No  Information entered by :: Lanier Ensign, LPN   Activities of Daily Living    07/07/2022   10:12 AM  In your present state of health, do you have any difficulty performing the following activities:  Hearing? 0  Vision? 0  Difficulty concentrating or making decisions? 0  Walking or climbing stairs? 1  Dressing or bathing? 0  Doing errands, shopping? 0  Preparing Food and eating ? N  Using the Toilet? N  In the past six months, have you accidently leaked urine? N  Do you have problems with loss of bowel control? N  Managing your Medications? N  Managing your Finances? N  Housekeeping or managing your Housekeeping? N    Patient Care Team: Shelva Majestic, MD as PCP - General (Family Medicine) Marykay Lex, MD as PCP - Cardiology (Cardiology) Casper Harrison. Quillian Quince, MD (Cardiology) Barron Alvine, MD (Inactive) as Attending Physician (Urology) Ranelle Oyster, MD as Consulting Physician  (Physical Medicine and Rehabilitation) Marykay Lex, MD as Consulting Physician (Cardiology) Opthamology, [facility] as Consulting Physician (Ophthalmology)  Indicate any recent Medical Services you may have received from other than [facility] providers  in the past year (date may be approximate).     Assessment:   This is a routine wellness examination for Franklin Hicks.  Hearing/Vision screen Hearing Screening - Comments:: Pt denies any hearing issues  Vision Screening - Comments:: Pt follows up with [facility] for annual eye exams  Dietary issues and exercise activities discussed: Current Exercise Habits: The patient does not participate in regular exercise at present   Goals Addressed             This Visit's Progress    Patient Stated       Lose weight        Depression Screen    07/07/2022   10:10 AM 07/01/2022    3:14 PM 06/27/2021    2:45 PM 06/20/2021   10:21 AM 09/21/2020   10:17 AM 01/06/2020    2:59 PM 12/22/2019    2:15 PM  PHQ 2/9 Scores  PHQ - 2 Score 0 0 0 0 0 0 0  PHQ- 9 Score  0         Fall Risk    07/07/2022   10:12 AM 07/01/2022    3:11 PM 06/27/2021    2:44 PM 06/20/2021   10:23 AM 09/21/2020   10:17 AM  Fall Risk   Falls in the past year? 0 0 0 0 0  Number falls in past yr: 0 0 0 0 0  Injury with Fall? 0 0 0 0 0  Risk for fall due to : Impaired vision History of fall(s) Impaired balance/gait Impaired vision;Impaired balance/gait;Impaired mobility   Risk for fall due to: Comment    pt uses walker, cane and has motorized scooter to aid in mobility   Follow up Falls prevention discussed Falls evaluation completed  Falls prevention discussed     FALL RISK PREVENTION PERTAINING TO THE HOME:  Any stairs in or around the home? Yes  If so, are there any without handrails? No  Home free of loose throw rugs in walkways, pet beds, electrical cords, etc? Yes  Adequate lighting in your home to reduce risk of falls? Yes   ASSISTIVE DEVICES  UTILIZED TO PREVENT FALLS:  Life alert? No  Use of a cane, walker or w/c? Yes  Grab bars in the bathroom? Yes  Shower chair or bench in shower? Yes  Elevated toilet seat or a handicapped toilet? Yes   TIMED UP AND GO:  Was the test performed? No .  Cognitive Function:        07/07/2022   10:13 AM 06/20/2021   10:24 AM 12/22/2019    2:46 PM  6CIT Screen  What Year? 0 points 0 points 0 points  What month? 0 points 0 points 0 points  What time? 0 points 0 points 0 points  Count back from 20 0 points 0 points 0 points  Months in reverse 0 points 0 points 0 points  Repeat phrase 0 points 0 points 0 points  Total Score 0 points 0 points 0 points    Immunizations Immunization History  Administered Date(s) Administered   Fluad Quad(high Dose 65+) 07/01/2022   Influenza Split 09/15/2011, 07/15/2012   Influenza Whole 06/25/1998, 05/31/2007, 07/17/2010   Influenza, High Dose Seasonal PF 09/24/2017   Influenza,inj,Quad PF,6+ Mos 07/14/2013, 07/30/2015   PFIZER Comirnaty(Gray Top)Covid-19 Tri-Sucrose Vaccine 09/27/2020, 02/28/2021   PFIZER(Purple Top)SARS-COV-2 Vaccination 01/12/2020, 02/03/2020   Td 08/26/1999, 12/08/2008    TDAP status: Due, Education has been provided regarding the importance of this vaccine. Advised may receive  this vaccine at local pharmacy or Health Dept. Aware to provide a copy of the vaccination record if obtained from local pharmacy or Health Dept. Verbalized acceptance and understanding.  Flu Vaccine status: Up to date  Pneumococcal vaccine status: Declined,  Education has been provided regarding the importance of this vaccine but patient still declined. Advised may receive this vaccine at local pharmacy or Health Dept. Aware to provide a copy of the vaccination record if obtained from local pharmacy or Health Dept. Verbalized acceptance and understanding.   Covid-19 vaccine status: Completed vaccines  Qualifies for Shingles Vaccine? Yes   Zostavax  completed No   Shingrix Completed?: No.    Education has been provided regarding the importance of this vaccine. Patient has been advised to call insurance company to determine out of pocket expense if they have not yet received this vaccine. Advised may also receive vaccine at local pharmacy or Health Dept. Verbalized acceptance and understanding.  Screening Tests Health Maintenance  Topic Date Due   COVID-19 Vaccine (5 - Pfizer series) 07/17/2022 (Originally 04/25/2021)   Zoster Vaccines- Shingrix (1 of 2) 07/23/2022 (Originally 06/28/1999)   Pneumonia Vaccine 86+ Years old (1 - PCV) 10/18/2022 (Originally 06/27/2014)   TETANUS/TDAP  10/18/2022 (Originally 12/09/2018)   COLONOSCOPY (Pts 45-17yrs Insurance coverage will need to be confirmed)  12/22/2023 (Originally 06/27/1994)   OPHTHALMOLOGY EXAM  11/29/2022   HEMOGLOBIN A1C  12/30/2022   Diabetic kidney evaluation - GFR measurement  07/02/2023   Diabetic kidney evaluation - Urine ACR  07/02/2023   FOOT EXAM  07/02/2023   Medicare Annual Wellness (AWV)  07/08/2023   INFLUENZA VACCINE  Completed   Hepatitis C Screening  Completed   HPV VACCINES  Aged Out    Health Maintenance  There are no preventive care reminders to display for this patient.  Colorectal cancer screening  postponed per pt request   Additional Screening:  Hepatitis C Screening:  Completed 07/30/15  Vision Screening: Recommended annual ophthalmology exams for early detection of glaucoma and other disorders of the eye. Is the patient up to date with their annual eye exam?  Yes  Who is the provider or what is the name of the office in which the patient attends annual eye exams? [facility]  If pt is not established with a provider, would they like to be referred to a provider to establish care? No .    Dental Screening: Recommended annual dental exams for proper oral hygiene  Community Resource Referral / Chronic Care Management: CRR required this visit?   No   CCM required this visit?  No      Plan:     I have personally reviewed and noted the following in the patient's chart:   Medical and social history Use of alcohol, tobacco or illicit drugs  Current medications and supplements including opioid prescriptions. Patient is not currently taking opioid prescriptions. Functional ability and status Nutritional status Physical activity Advanced directives List of other physicians Hospitalizations, surgeries, and ER visits in previous 12 months Vitals Screenings to include cognitive, depression, and falls Referrals and appointments  In addition, I have reviewed and discussed with patient certain preventive protocols, quality metrics, and best practice recommendations. A written personalized care plan for preventive services as well as general preventive health recommendations were provided to patient.     Marzella Schlein, LPN   16/05/9603   Nurse Notes: none

## 2022-07-09 ENCOUNTER — Encounter: Payer: Self-pay | Admitting: Dietician

## 2022-07-09 ENCOUNTER — Encounter: Payer: Medicare PPO | Attending: Family Medicine | Admitting: Dietician

## 2022-07-09 VITALS — Ht 72.0 in

## 2022-07-09 DIAGNOSIS — E119 Type 2 diabetes mellitus without complications: Secondary | ICD-10-CM | POA: Diagnosis not present

## 2022-07-09 NOTE — Patient Instructions (Addendum)
Aim for 150 minutes of physical activity weekly. -consider trying a chair workout  Eat more Non-Starchy Vegetables. Aim to make 1/2 of your plate non-starchy vegetables.   Minimize added sugars and refined grains. Rethink what you drink. Choose beverages without added sugar. Look for 0 carbs on the label.  Make simple meals at home more often than eating out.  Aim to eat within 1-2 hours of waking up and every 3-5 hours following.  -Between breakfast and dinner have a balanced snack (carbohydrate + protein)  Mindfulness: Consistently scheduled meal - avoid skipping Eat slowly Away from distraction (sitting in kitchen or dining room) Stop eating when satisfied Before a snack ask, "Am I hungry or eating for another reason?"   "What can I do instead if I am not hungry?"

## 2022-07-09 NOTE — Progress Notes (Signed)
Diabetes Self-Management Education  Visit Type: First/Initial  Appt. Start Time: 1516 Appt. End Time: 1621  07/09/2022  [name], identified by name and date of birth, is a 71 y.o. male with a diagnosis of Diabetes: Type 2.   ASSESSMENT  Primary concern: Pt is concerned that his A1c has been going up and he does not want to start taking diabetes medication and wants to try and reduce his A1c with nutrition and exercise.   History includes: type 2 diabetes, MI, arthritis, HLD, HTN.  Labs noted: 07/01/22 A1c 8.3% Medications include: reviewed Supplements: vitamin C, MVI  Patient lives with 2 great grandchildren and 2 sister in laws. Pt's sister in law does shopping and cooking.   Pt states he uses his wheelchair about 60% of the time and has ramps at home that he can walk up.   Pt states his doctor wanted to start him on metformin but he was weary of it so he did not start taking it and pt also states his doctor wants to start him on jardiance but he wants to try to make dietary changes first to see if his A1c will come down.   Pt states he has moderate stress levels and enjoys listening to music for stress relief.   Pt reports that he falls asleep while watching TV after dinner sometimes, then when he wakes up he moves to another room and will stay up on his phone and eventually fall back asleep. Pt states he watches TV while eating dinner.   Pt wakes up at 10am, eats breakfast at 11am and eats again at 7pm (cheetos) and dinner at 9pm. Pt sleeps 4-5 hours.   Height 6' (1.829 m). Body mass index is 52.89 kg/m.   Diabetes Self-Management Education - 07/09/22 1521       Visit Information   Visit Type First/Initial      Initial Visit   Diabetes Type Type 2    Date Diagnosed 2021    Are you currently following a meal plan? No    Are you taking your medications as prescribed? Not on Medications      Health Coping   How would you rate your overall health? Fair       Psychosocial Assessment   Patient Belief/Attitude about Diabetes Motivated to manage diabetes    What is the hardest part about your diabetes right now, causing you the most concern, or is the most worrisome to you about your diabetes?   Making healty food and beverage choices    Self-care barriers None    Self-management support Doctor's office    Other persons present Patient    Patient Concerns Healthy Lifestyle    Special Needs None    Preferred Learning Style No preference indicated    Learning Readiness Not Ready    How often do you need to have someone help you when you read instructions, pamphlets, or other written materials from your doctor or pharmacy? 1 - Never    What is the last grade level you completed in school? college      Pre-Education Assessment   Patient understands the diabetes disease and treatment process. Needs Instruction    Patient understands incorporating nutritional management into lifestyle. Needs Instruction    Patient undertands incorporating physical activity into lifestyle. Needs Instruction    Patient understands using medications safely. Needs Instruction    Patient understands monitoring blood glucose, interpreting and using results Needs Instruction    Patient understands prevention,  detection, and treatment of acute complications. Needs Instruction    Patient understands prevention, detection, and treatment of chronic complications. Needs Instruction    Patient understands how to develop strategies to address psychosocial issues. Needs Instruction    Patient understands how to develop strategies to promote health/change behavior. Needs Instruction      Complications   Last HgB A1C per patient/outside source 8.3 %    How often do you check your blood sugar? Patient declines    Have you had a dilated eye exam in the past 12 months? Yes    Have you had a dental exam in the past 12 months? No    Are you checking your feet? No      Dietary Intake    Breakfast 11am: boiled eggs or fried eggs on english muffin or [employer] bread and apple    Snack (morning) none    Lunch none    Snack (afternoon) cheetos or corn chips    Dinner 9pm: salad with fish    Snack (evening) none    Beverage(s) 48 oz water      Activity / Exercise   Activity / Exercise Type ADL's    How many days per week do you exercise? 1    How many minutes per day do you exercise? 15    Total minutes per week of exercise 15      Patient Education   Previous Diabetes Education No    Disease Pathophysiology Definition of diabetes, type 1 and 2, and the diagnosis of diabetes;Factors that contribute to the development of diabetes;Explored patient's options for treatment of their diabetes    Healthy Eating Role of diet in the treatment of diabetes and the relationship between the three main macronutrients and blood glucose level;Food label reading, portion sizes and measuring food.;Plate Method;Reviewed blood glucose goals for pre and post meals and how to evaluate the patients' food intake on their blood glucose level.;Meal timing in regards to the patients' current diabetes medication.;Information on hints to eating out and maintain blood glucose control.;Meal options for control of blood glucose level and chronic complications.    Being Active Role of exercise on diabetes management, blood pressure control and cardiac health.;Helped patient identify appropriate exercises in relation to his/her diabetes, diabetes complications and other health issue.    Monitoring Identified appropriate SMBG and/or A1C goals.;Daily foot exams;Yearly dilated eye exam    Acute complications Taught prevention, symptoms, and  treatment of hypoglycemia - the 15 rule.;Discussed and identified patients' prevention, symptoms, and treatment of hyperglycemia.;Educated on sick day management    Chronic complications Relationship between chronic complications and blood glucose control;Assessed and discussed foot care  and prevention of foot problems;Lipid levels, blood glucose control and heart disease;Identified and discussed with patient  current chronic complications;Dental care;Retinopathy and reason for yearly dilated eye exams;Nephropathy, what it is, prevention of, the use of ACE, ARB's and early detection of through urine microalbumia.;Reviewed with patient heart disease, higher risk of, and prevention    Diabetes Stress and Support Identified and addressed patients feelings and concerns about diabetes;Worked with patient to identify barriers to care and solutions;Role of stress on diabetes    Lifestyle and Health Coping Lifestyle issues that need to be addressed for better diabetes care      Individualized Goals (developed by patient)   Nutrition General guidelines for healthy choices and portions discussed    Physical Activity Exercise 3-5 times per week;30 minutes per day    Medications take my medication  as prescribed    Monitoring  Not Applicable    Problem Solving Eating Pattern    Reducing Risk do foot checks daily;treat hypoglycemia with 15 grams of carbs if blood glucose less than 70mg /dL    Health Coping Ask for help with psychological, social, or emotional issues      Post-Education Assessment   Patient understands the diabetes disease and treatment process. Comprehends key points    Patient understands incorporating nutritional management into lifestyle. Comprehends key points    Patient undertands incorporating physical activity into lifestyle. Comprehends key points    Patient understands using medications safely. N/A    Patient understands monitoring blood glucose, interpreting and using results Comprehends key points    Patient understands prevention, detection, and treatment of acute complications. Comprehends key points    Patient understands prevention, detection, and treatment of chronic complications. Comprehends key points    Patient understands how to develop strategies to  address psychosocial issues. Comprehends key points    Patient understands how to develop strategies to promote health/change behavior. Comprehends key points      Outcomes   Expected Outcomes Demonstrated interest in learning. Expect positive outcomes    Future DMSE 3-4 months    Program Status Not Completed             Individualized Plan for Diabetes Self-Management Training:   Learning Objective:  Patient will have a greater understanding of diabetes self-management. Patient education plan is to attend individual and/or group sessions per assessed needs and concerns.   Plan:   Patient Instructions  Aim for 150 minutes of physical activity weekly. -consider trying a chair workout  Eat more Non-Starchy Vegetables. Aim to make 1/2 of your plate non-starchy vegetables.   Minimize added sugars and refined grains. Rethink what you drink. Choose beverages without added sugar. Look for 0 carbs on the label.  Make simple meals at home more often than eating out.  Aim to eat within 1-2 hours of waking up and every 3-5 hours following.  -Between breakfast and dinner have a balanced snack (carbohydrate + protein)  Mindfulness: Consistently scheduled meal - avoid skipping Eat slowly Away from distraction (sitting in kitchen or dining room) Stop eating when satisfied Before a snack ask, "Am I hungry or eating for another reason?"   "What can I do instead if I am not hungry?"  Expected Outcomes:  Demonstrated interest in learning. Expect positive outcomes  Education material provided: ADA - How to Thrive: A Guide for Your Journey with Diabetes, My Plate, and Snack sheet  If problems or questions, patient to contact team via:  Phone  Future DSME appointment: 3-4 months

## 2022-07-11 ENCOUNTER — Ambulatory Visit: Payer: Medicare PPO

## 2022-07-11 DIAGNOSIS — R531 Weakness: Secondary | ICD-10-CM | POA: Diagnosis not present

## 2022-07-11 DIAGNOSIS — R2681 Unsteadiness on feet: Secondary | ICD-10-CM

## 2022-07-11 DIAGNOSIS — M5459 Other low back pain: Secondary | ICD-10-CM

## 2022-07-11 NOTE — Therapy (Signed)
OUTPATIENT PHYSICAL THERAPY TREATMENT NOTE/10th VISIT PROGRESS NOTE   Patient Name: Franklin Hicks MRN: [id number] DOB:[birth year], 71 y.o., male Today's Date: 07/11/2022  PCP: Marin Olp, MD REFERRING PROVIDER: Marin Olp, MD  END OF SESSION:   PT End of Session - 07/11/22 1351     Visit Number 10    Number of Visits 13    Date for PT Re-Evaluation 07/10/22    Authorization Type [employer] MEDICARE CHOICE PPO    PT Start Time 1350    PT Stop Time 1430    PT Time Calculation (min) 40 min    Activity Tolerance Patient tolerated treatment well    Behavior During Therapy [facility] for tasks assessed/performed                Past Medical History:  Diagnosis Date   Arthritis    "knees" (12/17/2017)   CAD S/P percutaneous coronary angioplasty 09/20/2016   Nstemi 08/2016. Dr. Doylene Canard. DES- brilinta and asa. Requests change to [facility] cardiology; 95% Ramus -> PCI Resolute Onyx DES 2.75 x 18   Central cord syndrome ([place]) 12/17/2017   Chronic combined systolic and diastolic heart failure (HCC) 10/09/2016   EF 45% and grade II diastolic after nstemi   Diet-controlled diabetes mellitus (HCC)    DJD (degenerative joint disease)    Gout    High cholesterol    Hypertension    NSTEMI (non-ST elevated myocardial infarction) ([place]) 09/20/2016   95% Ramus - > PCI    Obesity    Recurrent pulmonary embolism ([place]) 11/'14; 4/'19   a) Bilateral segmental and subsegmental pulmonary emboli with mild RV Strain.; b) after fall with C-spine Fxr --> Acute PE of right main pulmonary artery extending into multiple segments.   Past Surgical History:  Procedure Laterality Date   CARDIAC CATHETERIZATION N/A 09/19/2016   Procedure: Left Heart Cath and Coronary Angiography;  Surgeon: Dixie Dials, MD;  Location: [place] [facility];  Service: Cardiovascular: 95% proximal Ramus Intermedius --> PCI   CARDIAC CATHETERIZATION N/A 09/19/2016   Procedure: Coronary Stent Intervention;  Surgeon: Nelva Bush,  MD;  Location: [facility];  Service: Cardiovascular: 95% ramus intermedius;  Resolute Onyx 2.75 x 18 mm drug-eluting stent   CYSTOSCOPY/RETROGRADE/URETEROSCOPY/STONE EXTRACTION WITH BASKET  [id number],7741   ureteral stone    MENISCUS REPAIR Left    knee open meniscetomy   TRANSTHORACIC ECHOCARDIOGRAM  2004   no lvh nl ejection fraction   TRANSTHORACIC ECHOCARDIOGRAM  09/19/2016   In setting of NSTEMI:  EF 45-50% with diffuse hypokinesis. GR 2 DD. Mild biatrial enlargement.   Patient Active Problem List   Diagnosis Date Noted   Preoperative cardiovascular examination 02/05/2021   Recurrent pulmonary embolism (HCC)    Pain of left heel    C4 spinal cord injury, sequela ([place]) 12/18/2017   Tetraplegia ([place]) 12/18/2017   Aortic aneurysm ([place]) 12/16/2017   Cord compression ([place]) 12/13/2017   Chronic combined systolic and diastolic heart failure ([place]) 10/09/2016   CAD S/P percutaneous coronary angioplasty 09/20/2016   Primary osteoarthritis of knees, bilateral 01/11/2016   Erectile dysfunction 07/30/2015   Morbid obesity ([place]) 07/18/2013   Angioedema of lips 07/13/2013   Multinodular goiter 07/13/2013   SOB (shortness of breath) 10/02/2011   BPH with urinary obstruction 07/17/2010   Diabetes mellitus type 2, controlled ([employer]) 07/17/2010   Bilateral lower extremity edema 12/08/2008   NEUROPATHY, IDIOPATHIC PERIPHERAL NEC 05/31/2007   Osteoarthritis 03/22/2007   Gout 03/15/2007   Essential hypertension 03/15/2007  REFERRING DIAG: Chronic bilateral low back pain without sciatica [M54.50, G89.29]  THERAPY DIAG: Difficulty in walking, not elsewhere classified   Muscle weakness (generalized)   Other low back pain   Other lack of coordination   Other abnormalities of gait and mobility   Rationale for Evaluation and Treatment Rehabilitation  PERTINENT HISTORY: Arthritis, DM, DJD  PRECAUTIONS: fall and transfers  SUBJECTIVE: Sno pain or reports of twinges since last  session   PAIN:  Are you having pain? Yes: NPRS scale: 8/10 Pain location: low back Pain description: R low back Aggravating factors: twisting  Relieving factors: medication     OBJECTIVE: (objective measures completed at initial evaluation unless otherwise dated)  DIAGNOSTIC FINDINGS:  None   COGNITION:           Overall cognitive status: Within functional limits for tasks assessed                          SENSATION: WFL     POSTURE: decreased lumbar lordosis and anterior pelvic tilt   PALPATION: Bilat QL   LUMBAR ROM:    Active  A/PROM  eval AROM 06/19/22  Flexion 25%  WFL  Extension Pt unable to get to neutral  Lacks 25% from neutral  Right lateral flexion 75%  75%  Left lateral flexion WFL 75%  Right rotation 75%    Left rotation WFL    (Blank rows = not tested)   LOWER EXTREMITY ROM:      Active  Right eval Left eval  Hip flexion WFL with soft tissue restrictions.  WFL with soft tissue restrictions.  Knee flexion 88 in WC, pt states he is unable to go any further.  72 degrees in WC, pt reports restrictions due to past injuries.   Knee extension Lacking 15 degrees in WC Lacking 40 degrees in WC   (Blank rows = not tested)   LOWER EXTREMITY MMT:     MMT Right eval Left eval L 06/19/22  Hip flexion _0 Hip abduction       Knee flexion _1 Knee extension 4 2+ 4+   (Blank rows = not tested)   LUMBAR SPECIAL TESTS:  Unable to assess due to weight restrictions on table at current clinic.    FUNCTIONAL TESTS:  None on eval due to time restrains. Assess next session.    GAIT: Distance walked: Self propelled WC 57f.  Assistive device utilized: Wheelchair (manual) Level of assistance: Complete Independence Comments: Pt self propels in WC, with R LE and Bilat UE's. Upon standing he was unable to stand up straight, but reports FWW being too narrow.        TODAY'S TREATMENT  [facility] Adult PT Treatment:                                                 DATE: 07/11/22 Therapeutic Exercise: Supine SLR 15x B 3# Supine march alternating 15/15 2# Supine hor abd RTB 20x Supine cane flexion with inspiration 15x Supine alternating shoulder flexion 2# 15/15 Supine hip fallouts GTB 15x B Seated trunk flexion against red band 20x Seated trunk extenison against red band 20x Nustep L6 8 min   [facility] Adult PT Treatment:  DATE: 07/03/22 Therapeutic Exercise: Supine SLR 15x B 2# Supine march alternating 15/15 2# Supine hor abd RTB 15x Supine cane flexion with inspiration 15x Supine alternating shoulder flexion 2# 15/15 Supine curl ups with balck p-ball 15x, exhhale on contraction 3s hold Supine hip fallouts GTB 15x B Supine hip fallouts GTB 15x unilateral  Seated trunk flexion against red band 15x Nustep L4 8 min    [facility] Adult PT Treatment:                                                DATE: 06/26/22 Therapeutic Exercise: Supine SLR 15x B Supine hor abd RTB 15x Supine cane flexion with inspiration 15x Supine alternating shoulder flexion 2# 15/15 Supine curl ups with balck p-ball 15x, exhhale on contraction  Supine hip fallouts GTB 15x B Supine hip fallouts GTB 15x unilateral  Seated trunk flexion/extension placing 5000g ball onto stool and back 15x Seated trunk rotation with 5000g ball 15/15 hip level  [facility] Adult PT Treatment:                                                DATE: 06/19/22 Therapeutic Exercise: 122f ambulation with SBA and RW Seated ball roll flexion 30s x3 Latissimus pushdowns with inspiration on active press 10x Alternating marching with lat press 10/10 Posterior lean with p-ball elevation 10x Seated trunk rotation with 3000g ball 10/10 hip level Seated trunk rotation with 3000g ball 10/10 shoulder level Seated Vs with 3000g ball 10x Seated trunk flexion/extension placing 3000g ball onto stool and back 15x  [facility] Adult PT Treatment:                                                 DATE: 06/12/22 Therapeutic Exercise: Seated ball roll flexion 30s x3 Latissimus pushdowns with inspiration on active press 10x Heel slides with lat press 15/15 Posterior lean with p-ball elevation 10x Seated trunk rotation with 2000g ball 10/10 hip level Seated trunk rotation with 2000g ball 10/10 shoulder level Seated Vs with 2000g ball 10x Standing in walker 30s w/o UE support 5x STS w/o RW support, UEs on knees  Tandem stance in RW with 1 foot on 2 in step 30s x2 each side Seated trunk flexion/extension placing 3000g ball onto stool and back 15x  [facility] Adult PT Treatment:                                                DATE: 06/05/22 Therapeutic Exercise: Seated ball roll flexion 30s x2 forward Seated ball roll flexion 30s x1 lateral Latissimus pushdowns with inspiration on active press 10x 3s hold Seated trunk rotation with 3000g ball 10/10 Seated hip tosses with 3000g ball 10/10       PATIENT EDUCATION:  Education details: Educated pt on anatomy and physiology of current symptoms, diagnosis, prognosis, HEP,  and POC. Person educated: Patient Education method: ECustomer service managerEducation comprehension: verbalized understanding and returned demonstration  HOME EXERCISE PROGRAM: Access Code: [email or web address] URL: [email or web address] Date: 05/22/2022 Prepared by: Sharlynn Oliphant  Exercises - Seated Abdominal Press into [employer]  - 2 x daily - 5 x weekly - 1 sets - 10 reps - 3s hold - Seated Flexion Stretch with Swiss Ball  - 2 x daily - 5 x weekly - 1 sets - 10 reps - 3s hold     ASSESSMENT:   CLINICAL IMPRESSION: No pain to report today and has been pain free for over a week.  During today's session resistance was increased on aerobic activities as well as lower extremities to promote lower extremity strength as well as increased core and abdominal control.  Added additional trunk extension exercises in the form of resisted seated  trunk extension against the red Thera-Band.  Patient able to complete all tasks today without any increase in symptoms.  Also allowed patient to ambulate in clinic with rolling walker from machines to table.   OBJECTIVE IMPAIRMENTS: decreased activity tolerance, difficulty walking, decreased balance, decreased endurance, decreased mobility, decreased ROM, decreased strength, impaired flexibility, impaired LE use, postural dysfunction, and pain.   ACTIVITY LIMITATIONS: bending, lifting, carry, locomotion, cleaning, community activity, driving, and or occupation   PERSONAL FACTORS: Arthritis, DM, DJD are also affecting patient's functional outcome.   REHAB POTENTIAL: Good   CLINICAL DECISION MAKING: Stable/uncomplicated   EVALUATION COMPLEXITY: Low       GOALS: Short term PT Goals Target date: 06/05/2022 Pt will be I and compliant with HEP. Baseline: [id number] Goal status: met   Long term PT goals Target date: 08/02/2022 Pt will improve ROM to [facility] to improve functional mobility Baseline: Goal status: Patient presents with ROM WFL for tasks required to complete ADLs and mobility, ongoing  Pt will improve hip/knee strength to at least 4+/5 MMT to improve functional strength Baseline: MMT Right eval Left eval L 06/19/22  Hip flexion _0 Hip abduction       Knee flexion _1 Knee extension 4 2+ 4+   Goal status: Met  Pt will reduce pain by overall 50% overall with usual activity Baseline: 06/19/22 Pain described as soreness Goal status: Ongoing  Pt will reduce pain to overall less than 2-3/10 with transfers. Baseline: 06/19/22 No pain with transfers but requires multiple attempts to stand; 07/11/22 No pain over 1 week Goal status: Met  Pt will be able to ambulate community distances at least 150 ft with FWW without pain.  Baseline: 06/19/22 143f wirh RW, SBA across level ground; 07/11/22 375fwith RW and S Goal status: Ongoing   PLAN: PT FREQUENCY: 1 times per week     PT DURATION: 4 weeks   PLANNED INTERVENTIONS (unless contraindicated): aquatic PT, Canalith repositioning, cryotherapy, Electrical stimulation, Iontophoresis with 4 mg/ml dexamethasome, Moist heat, traction, Ultrasound, gait training, Therapeutic exercise, balance training, neuromuscular re-education, patient/family education, prosthetic training, manual techniques, passive ROM, dry needling, taping, vasopnuematic device, vestibular, spinal manipulations, joint manipulations   PLAN FOR NEXT SESSION: Assess HEP/update PRN, continue to progress functional mobility, strengthen core, proximal hip muscles. Decrease patients pain and help minimize functional deficits, improve gait and balance. Use elevating mat table      JeLanice ShirtsPT 07/11/2022, 2:19 PM

## 2022-07-24 ENCOUNTER — Ambulatory Visit: Payer: Medicare PPO

## 2022-07-24 DIAGNOSIS — R531 Weakness: Secondary | ICD-10-CM | POA: Diagnosis not present

## 2022-07-24 DIAGNOSIS — M5459 Other low back pain: Secondary | ICD-10-CM

## 2022-07-24 DIAGNOSIS — R2681 Unsteadiness on feet: Secondary | ICD-10-CM | POA: Diagnosis not present

## 2022-07-24 NOTE — Therapy (Signed)
OUTPATIENT PHYSICAL THERAPY TREATMENT NOTE   Patient Name: Franklin Hicks MRN: [id number] DOB:Jan 05, 1951, 71 y.o., male Today's Date: 07/24/2022  PCP: Marin Olp, MD REFERRING PROVIDER: Marin Olp, MD  END OF SESSION:   PT End of Session - 07/24/22 1310     Visit Number 11    Number of Visits 13    Date for PT Re-Evaluation 08/08/22    Authorization Type [employer] MEDICARE CHOICE PPO    PT Start Time 1315    PT Stop Time 1219    PT Time Calculation (min) 40 min    Activity Tolerance Patient tolerated treatment well    Behavior During Therapy [facility] for tasks assessed/performed                 Past Medical History:  Diagnosis Date   Arthritis    "knees" (12/17/2017)   CAD S/P percutaneous coronary angioplasty 09/20/2016   Nstemi 08/2016. Dr. Doylene Canard. DES- brilinta and asa. Requests change to [facility] cardiology; 95% Ramus -> PCI Resolute Onyx DES 2.75 x 18   Central cord syndrome ([place]) 12/17/2017   Chronic combined systolic and diastolic heart failure (HCC) 10/09/2016   EF 45% and grade II diastolic after nstemi   Diet-controlled diabetes mellitus (HCC)    DJD (degenerative joint disease)    Gout    High cholesterol    Hypertension    NSTEMI (non-ST elevated myocardial infarction) ([place]) 09/20/2016   95% Ramus - > PCI    Obesity    Recurrent pulmonary embolism ([place]) 11/'14; 4/'19   a) Bilateral segmental and subsegmental pulmonary emboli with mild RV Strain.; b) after fall with C-spine Fxr --> Acute PE of right main pulmonary artery extending into multiple segments.   Past Surgical History:  Procedure Laterality Date   CARDIAC CATHETERIZATION N/A 09/19/2016   Procedure: Left Heart Cath and Coronary Angiography;  Surgeon: Dixie Dials, MD;  Location: [place] [facility];  Service: Cardiovascular: 95% proximal Ramus Intermedius --> PCI   CARDIAC CATHETERIZATION N/A 09/19/2016   Procedure: Coronary Stent Intervention;  Surgeon: Nelva Bush, MD;  Location: [place] [facility];  Service: Cardiovascular: 95% ramus intermedius;  Resolute Onyx 2.75 x 18 mm drug-eluting stent   CYSTOSCOPY/RETROGRADE/URETEROSCOPY/STONE EXTRACTION WITH BASKET  7588,3254   ureteral stone    MENISCUS REPAIR Left    knee open meniscetomy   TRANSTHORACIC ECHOCARDIOGRAM  2004   no lvh nl ejection fraction   TRANSTHORACIC ECHOCARDIOGRAM  09/19/2016   In setting of NSTEMI:  EF 45-50% with diffuse hypokinesis. GR 2 DD. Mild biatrial enlargement.   Patient Active Problem List   Diagnosis Date Noted   Preoperative cardiovascular examination 02/05/2021   Recurrent pulmonary embolism (HCC)    Pain of left heel    C4 spinal cord injury, sequela ([place]) 12/18/2017   Tetraplegia ([place]) 12/18/2017   Aortic aneurysm ([place]) 12/16/2017   Cord compression ([place]) 12/13/2017   Chronic combined systolic and diastolic heart failure ([place]) 10/09/2016   CAD S/P percutaneous coronary angioplasty 09/20/2016   Primary osteoarthritis of knees, bilateral 01/11/2016   Erectile dysfunction 07/30/2015   Morbid obesity ([place]) 07/18/2013   Angioedema of lips 07/13/2013   Multinodular goiter 07/13/2013   SOB (shortness of breath) 10/02/2011   BPH with urinary obstruction 07/17/2010   Diabetes mellitus type 2, controlled ([place]) 07/17/2010   Bilateral lower extremity edema 12/08/2008   NEUROPATHY, IDIOPATHIC PERIPHERAL NEC 05/31/2007   Osteoarthritis 03/22/2007   Gout 03/15/2007   Essential hypertension 03/15/2007  REFERRING DIAG: Chronic bilateral low back pain without sciatica [M54.50, G89.29]  THERAPY DIAG: Difficulty in walking, not elsewhere classified   Muscle weakness (generalized)   Other low back pain   Other lack of coordination   Other abnormalities of gait and mobility   Rationale for Evaluation and Treatment Rehabilitation  PERTINENT HISTORY: Arthritis, DM, DJD  PRECAUTIONS: fall and transfers  SUBJECTIVE: Patient reports back has been feeling "pretty good".  Has not any  spasm or issues including the thanksgiving holiday and increase activity   PAIN:  Are you having pain? Yes: NPRS scale: 8/10 Pain location: low back Pain description: R low back Aggravating factors: twisting  Relieving factors: medication     OBJECTIVE: (objective measures completed at initial evaluation unless otherwise dated)  DIAGNOSTIC FINDINGS:  None   COGNITION:           Overall cognitive status: Within functional limits for tasks assessed                          SENSATION: WFL     POSTURE: decreased lumbar lordosis and anterior pelvic tilt   PALPATION: Bilat QL   LUMBAR ROM:    Active  A/PROM  eval AROM 06/19/22 AROM 07/24/22  Flexion 25%  [facility] WFL  Extension Pt unable to get to neutral  Lacks 25% from neutral unchanged  Right lateral flexion 75%  75% 75%  Left lateral flexion WFL 75% 75%  Right rotation 75%     Left rotation WFL     (Blank rows = not tested)   LOWER EXTREMITY ROM:      Active  Right eval Left eval  Hip flexion WFL with soft tissue restrictions.  WFL with soft tissue restrictions.  Knee flexion 88 in WC, pt states he is unable to go any further.  72 degrees in WC, pt reports restrictions due to past injuries.   Knee extension Lacking 15 degrees in WC Lacking 40 degrees in WC   (Blank rows = not tested)   LOWER EXTREMITY MMT:     MMT Right eval Left eval L 06/19/22  Hip flexion _0 Hip abduction       Knee flexion _1 Knee extension 4 2+ 4+   (Blank rows = not tested)   LUMBAR SPECIAL TESTS:  Unable to assess due to weight restrictions on table at current clinic.    FUNCTIONAL TESTS:  None on eval due to time restrains. Assess next session.    GAIT: Distance walked: Self propelled WC 57f.  Assistive device utilized: Wheelchair (manual) Level of assistance: Complete Independence Comments: Pt self propels in WC, with R LE and Bilat UE's. Upon standing he was unable to stand up straight, but reports FWW being too  narrow.        TODAY'S TREATMENT  [facility] Adult PT Treatment:                                                DATE: 07/24/22 Therapeutic Exercise: Supine SLR 15x B 4# Supine march alternating 15/15 4# Supine hor abd GTB 15x Supine alternating hor abduction GTB 15/15 Supine cane flexion with inspiration 15x Supine alternating shoulder flexion 2# 15/15 Supine shoulder press 2# B 15x Supine alternating shoulder press 2# 15/15 Supine hip fallouts GTB 15x B Supine  hip fallouts GTB unilateral 15/15 Seated trunk flexion against purple band 15x Seated trunk extenison against red band 15x Nustep L6 8 min  [facility] Adult PT Treatment:                                                DATE: 07/11/22 Therapeutic Exercise: Supine SLR 15x B 3# Supine march alternating 15/15 2# Supine hor abd RTB 20x Supine cane flexion with inspiration 15x Supine alternating shoulder flexion 2# 15/15 Supine hip fallouts GTB 15x B Seated trunk flexion against red band 20x Seated trunk extenison against red band 20x Nustep L6 8 min   [facility] Adult PT Treatment:                                                DATE: 07/03/22 Therapeutic Exercise: Supine SLR 15x B 2# Supine march alternating 15/15 2# Supine hor abd RTB 15x Supine cane flexion with inspiration 15x Supine alternating shoulder flexion 2# 15/15 Supine curl ups with balck p-ball 15x, exhhale on contraction 3s hold Supine hip fallouts GTB 15x B Supine hip fallouts GTB 15x unilateral  Seated trunk flexion against red band 15x Nustep L4 8 min    [facility] Adult PT Treatment:                                                DATE: 06/26/22 Therapeutic Exercise: Supine SLR 15x B Supine hor abd RTB 15x Supine cane flexion with inspiration 15x Supine alternating shoulder flexion 2# 15/15 Supine curl ups with balck p-ball 15x, exhhale on contraction  Supine hip fallouts GTB 15x B Supine hip fallouts GTB 15x unilateral  Seated trunk flexion/extension placing 5000g  ball onto stool and back 15x Seated trunk rotation with 5000g ball 15/15 hip level  [facility] Adult PT Treatment:                                                DATE: 06/19/22 Therapeutic Exercise: 133f ambulation with SBA and RW Seated ball roll flexion 30s x3 Latissimus pushdowns with inspiration on active press 10x Alternating marching with lat press 10/10 Posterior lean with p-ball elevation 10x Seated trunk rotation with 3000g ball 10/10 hip level Seated trunk rotation with 3000g ball 10/10 shoulder level Seated Vs with 3000g ball 10x Seated trunk flexion/extension placing 3000g ball onto stool and back 15x  [facility] Adult PT Treatment:                                                DATE: 06/12/22 Therapeutic Exercise: Seated ball roll flexion 30s x3 Latissimus pushdowns with inspiration on active press 10x Heel slides with lat press 15/15 Posterior lean with p-ball elevation 10x Seated trunk rotation with 2000g ball 10/10 hip level Seated trunk rotation with 2000g ball 10/10 shoulder level Seated Vs with  2000g ball 10x Standing in walker 30s w/o UE support 5x STS w/o RW support, UEs on knees  Tandem stance in RW with 1 foot on 2 in step 30s x2 each side Seated trunk flexion/extension placing 3000g ball onto stool and back 15x  [facility] Adult PT Treatment:                                                DATE: 06/05/22 Therapeutic Exercise: Seated ball roll flexion 30s x2 forward Seated ball roll flexion 30s x1 lateral Latissimus pushdowns with inspiration on active press 10x 3s hold Seated trunk rotation with 3000g ball 10/10 Seated hip tosses with 3000g ball 10/10       PATIENT EDUCATION:  Education details: Educated pt on anatomy and physiology of current symptoms, diagnosis, prognosis, HEP,  and POC. Person educated: Patient Education method: Customer service manager Education comprehension: verbalized understanding and returned demonstration     HOME EXERCISE  PROGRAM: Access Code: [email or web address] URL: [email or web address] Date: 05/22/2022 Prepared by: Sharlynn Oliphant  Exercises - Seated Abdominal Press into [employer]  - 2 x daily - 5 x weekly - 1 sets - 10 reps - 3s hold - Seated Flexion Stretch with Swiss Ball  - 2 x daily - 5 x weekly - 1 sets - 10 reps - 3s hold     ASSESSMENT:   CLINICAL IMPRESSION: Patient has been symptom free over the past 2 weeks which have included the Thanksgiving holiday which entailed an increase in overall activity.  During today's session weights and resistance were added and unilateral activities are incorporated into patient's treatment program to facilitate intersegmental mobility and improve overall flexibility as well as decreased musculoskeletal tension.  Patient was able to tolerate all requested task without any increase in symptoms and feels he is progressing weekly.   OBJECTIVE IMPAIRMENTS: decreased activity tolerance, difficulty walking, decreased balance, decreased endurance, decreased mobility, decreased ROM, decreased strength, impaired flexibility, impaired LE use, postural dysfunction, and pain.   ACTIVITY LIMITATIONS: bending, lifting, carry, locomotion, cleaning, community activity, driving, and or occupation   PERSONAL FACTORS: Arthritis, DM, DJD are also affecting patient's functional outcome.   REHAB POTENTIAL: Good   CLINICAL DECISION MAKING: Stable/uncomplicated   EVALUATION COMPLEXITY: Low       GOALS: Short term PT Goals Target date: 06/05/2022 Pt will be I and compliant with HEP. Baseline: [id number] Goal status: met   Long term PT goals Target date: 08/02/2022 Pt will improve ROM to [facility] to improve functional mobility Baseline: Goal status: Patient presents with ROM WFL for tasks required to complete ADLs and mobility, ongoing  Pt will improve hip/knee strength to at least 4+/5 MMT to improve functional strength Baseline: MMT Right eval Left eval L 06/19/22  Hip  flexion _0 Hip abduction       Knee flexion _1 Knee extension 4 2+ 4+   Goal status: Met  Pt will reduce pain by overall 50% overall with usual activity Baseline: 06/19/22 Pain described as soreness Goal status: Ongoing  Pt will reduce pain to overall less than 2-3/10 with transfers. Baseline: 06/19/22 No pain with transfers but requires multiple attempts to stand; 07/11/22 No pain over 1 week Goal status: Met  Pt will be able to ambulate community distances at least 150 ft with FWW without pain.  Baseline: 06/19/22 129f  wirh RW, SBA across level ground; 07/11/22 41f with RW and S Goal status: Ongoing   PLAN: PT FREQUENCY: 1 times per week    PT DURATION: 4 weeks   PLANNED INTERVENTIONS (unless contraindicated): aquatic PT, Canalith repositioning, cryotherapy, Electrical stimulation, Iontophoresis with 4 mg/ml dexamethasome, Moist heat, traction, Ultrasound, gait training, Therapeutic exercise, balance training, neuromuscular re-education, patient/family education, prosthetic training, manual techniques, passive ROM, dry needling, taping, vasopnuematic device, vestibular, spinal manipulations, joint manipulations   PLAN FOR NEXT SESSION: Assess HEP/update PRN, continue to progress functional mobility, strengthen core, proximal hip muscles. Decrease patients pain and help minimize functional deficits, improve gait and balance. Use elevating mat table      JLanice Shirts PT 07/24/2022, 3:12 PM

## 2022-07-31 ENCOUNTER — Ambulatory Visit: Payer: Medicare PPO | Attending: Family Medicine

## 2022-07-31 DIAGNOSIS — M5459 Other low back pain: Secondary | ICD-10-CM | POA: Diagnosis not present

## 2022-07-31 DIAGNOSIS — R531 Weakness: Secondary | ICD-10-CM | POA: Diagnosis not present

## 2022-07-31 NOTE — Therapy (Addendum)
OUTPATIENT PHYSICAL THERAPY TREATMENT NOTE/DC SUMMARY   Patient Name: Franklin Hicks MRN: [id number] DOB:1951/08/15, 71 y.o., male Today's Date: 07/31/2022  PCP: Marin Olp, MD REFERRING PROVIDER: Marin Olp, MD PHYSICAL THERAPY DISCHARGE SUMMARY  Visits from Start of Care: 12  Current functional level related to goals / functional outcomes: Goals met   Remaining deficits: mobility   Education / Equipment: HEP   Patient agrees to discharge. Patient goals were met. Patient is being discharged due to being pleased with the current functional level.  END OF SESSION:   PT End of Session - 07/31/22 1314     Visit Number 12    Number of Visits 13    Date for PT Re-Evaluation 08/08/22    Authorization Type [employer] MEDICARE CHOICE PPO    PT Start Time 1315    PT Stop Time 1355    PT Time Calculation (min) 40 min    Activity Tolerance Patient tolerated treatment well    Behavior During Therapy WFL for tasks assessed/performed                 Past Medical History:  Diagnosis Date   Arthritis    "knees" (12/17/2017)   CAD S/P percutaneous coronary angioplasty 09/20/2016   Nstemi 08/2016. Dr. Doylene Canard. DES- brilinta and asa. Requests change to [facility] cardiology; 95% Ramus -> PCI Resolute Onyx DES 2.75 x 18   Central cord syndrome ([place]) 12/17/2017   Chronic combined systolic and diastolic heart failure (HCC) 10/09/2016   EF 45% and grade II diastolic after nstemi   Diet-controlled diabetes mellitus (HCC)    DJD (degenerative joint disease)    Gout    High cholesterol    Hypertension    NSTEMI (non-ST elevated myocardial infarction) ([place]) 09/20/2016   95% Ramus - > PCI    Obesity    Recurrent pulmonary embolism ([place]) 11/'14; 4/'19   a) Bilateral segmental and subsegmental pulmonary emboli with mild RV Strain.; b) after fall with C-spine Fxr --> Acute PE of right main pulmonary artery extending into multiple segments.   Past Surgical History:  Procedure  Laterality Date   CARDIAC CATHETERIZATION N/A 09/19/2016   Procedure: Left Heart Cath and Coronary Angiography;  Surgeon: Dixie Dials, MD;  Location: [place] [facility];  Service: Cardiovascular: 95% proximal Ramus Intermedius --> PCI   CARDIAC CATHETERIZATION N/A 09/19/2016   Procedure: Coronary Stent Intervention;  Surgeon: Nelva Bush, MD;  Location: [place] [facility];  Service: Cardiovascular: 95% ramus intermedius;  Resolute Onyx 2.75 x 18 mm drug-eluting stent   CYSTOSCOPY/RETROGRADE/URETEROSCOPY/STONE EXTRACTION WITH BASKET  5859,2924   ureteral stone    MENISCUS REPAIR Left    knee open meniscetomy   TRANSTHORACIC ECHOCARDIOGRAM  2004   no lvh nl ejection fraction   TRANSTHORACIC ECHOCARDIOGRAM  09/19/2016   In setting of NSTEMI:  EF 45-50% with diffuse hypokinesis. GR 2 DD. Mild biatrial enlargement.   Patient Active Problem List   Diagnosis Date Noted   Preoperative cardiovascular examination 02/05/2021   Recurrent pulmonary embolism (HCC)    Pain of left heel    C4 spinal cord injury, sequela ([place]) 12/18/2017   Tetraplegia ([place]) 12/18/2017   Aortic aneurysm ([place]) 12/16/2017   Cord compression ([place]) 12/13/2017   Chronic combined systolic and diastolic heart failure ([place]) 10/09/2016   CAD S/P percutaneous coronary angioplasty 09/20/2016   Primary osteoarthritis of knees, bilateral 01/11/2016   Erectile dysfunction 07/30/2015   Morbid obesity ([place]) 07/18/2013   Angioedema of lips 07/13/2013  Multinodular goiter 07/13/2013   SOB (shortness of breath) 10/02/2011   BPH with urinary obstruction 07/17/2010   Diabetes mellitus type 2, controlled ([place]) 07/17/2010   Bilateral lower extremity edema 12/08/2008   NEUROPATHY, IDIOPATHIC PERIPHERAL NEC 05/31/2007   Osteoarthritis 03/22/2007   Gout 03/15/2007   Essential hypertension 03/15/2007    REFERRING DIAG: Chronic bilateral low back pain without sciatica [M54.50, G89.29]  THERAPY DIAG: Difficulty in walking, not  elsewhere classified   Muscle weakness (generalized)   Other low back pain   Other lack of coordination   Other abnormalities of gait and mobility   Rationale for Evaluation and Treatment Rehabilitation  PERTINENT HISTORY: Arthritis, DM, DJD  PRECAUTIONS: fall and transfers  SUBJECTIVE: Remains painfree since last session and over past several weeks   PAIN:  Are you having pain? Yes: NPRS scale: 8/10 Pain location: low back Pain description: R low back Aggravating factors: twisting  Relieving factors: medication     OBJECTIVE: (objective measures completed at initial evaluation unless otherwise dated)  DIAGNOSTIC FINDINGS:  None   COGNITION:           Overall cognitive status: Within functional limits for tasks assessed                          SENSATION: WFL     POSTURE: decreased lumbar lordosis and anterior pelvic tilt   PALPATION: Bilat QL   LUMBAR ROM:    Active  A/PROM  eval AROM 06/19/22 AROM 07/24/22  Flexion 25%  [facility] WFL  Extension Pt unable to get to neutral  Lacks 25% from neutral unchanged  Right lateral flexion 75%  75% 75%  Left lateral flexion WFL 75% 75%  Right rotation 75%     Left rotation WFL     (Blank rows = not tested)   LOWER EXTREMITY ROM:      Active  Right eval Left eval  Hip flexion WFL with soft tissue restrictions.  WFL with soft tissue restrictions.  Knee flexion 88 in WC, pt states he is unable to go any further.  72 degrees in WC, pt reports restrictions due to past injuries.   Knee extension Lacking 15 degrees in WC Lacking 40 degrees in WC   (Blank rows = not tested)   LOWER EXTREMITY MMT:     MMT Right eval Left eval L 06/19/22  Hip flexion _0 Hip abduction       Knee flexion _1 Knee extension 4 2+ 4+   (Blank rows = not tested)   LUMBAR SPECIAL TESTS:  Unable to assess due to weight restrictions on table at current clinic.    FUNCTIONAL TESTS:  None on eval due to time restrains. Assess  next session.    GAIT: Distance walked: Self propelled WC 36f.  Assistive device utilized: Wheelchair (manual) Level of assistance: Complete Independence Comments: Pt self propels in WC, with R LE and Bilat UE's. Upon standing he was unable to stand up straight, but reports FWW being too narrow.        TODAY'S TREATMENT  [facility] Adult PT Treatment:                                                DATE: 07/31/22 Therapeutic Exercise: Supine SLR 15x B 5# Supine SAQs 5#  15/15 Supine march alternating 15/15 5# Supine hor abd GTB 15x Supine alternating hor abduction GTB 15/15 Supine curl ups against black physioball 15x Supine curl ups against black physioball unilateral for rotational moment 15x Nustep L4 8 min FAQs in available ROM 5# 15x2 Seated heel slides 5# 30/30 B   [facility] Adult PT Treatment:                                                DATE: 07/24/22 Therapeutic Exercise: Supine SLR 15x B 4# Supine march alternating 15/15 4# Supine hor abd GTB 15x Supine alternating hor abduction GTB 15/15 Supine cane flexion with inspiration 15x Supine alternating shoulder flexion 2# 15/15 Supine shoulder press 2# B 15x Supine alternating shoulder press 2# 15/15 Supine hip fallouts GTB 15x B Supine hip fallouts GTB unilateral 15/15 Seated trunk flexion against purple band 15x Seated trunk extenison against red band 15x Nustep L6 8 min  [facility] Adult PT Treatment:                                                DATE: 07/11/22 Therapeutic Exercise: Supine SLR 15x B 3# Supine march alternating 15/15 2# Supine hor abd RTB 20x Supine cane flexion with inspiration 15x Supine alternating shoulder flexion 2# 15/15 Supine hip fallouts GTB 15x B Seated trunk flexion against red band 20x Seated trunk extenison against red band 20x Nustep L6 8 min       PATIENT EDUCATION:  Education details: Educated pt on anatomy and physiology of current symptoms, diagnosis, prognosis, HEP,  and POC. Person  educated: Patient Education method: Customer service manager Education comprehension: verbalized understanding and returned demonstration     HOME EXERCISE PROGRAM: Access Code: [email or web address] URL: [email or web address] Date: 05/22/2022 Prepared by: Sharlynn Oliphant  Exercises - Seated Abdominal Press into [employer]  - 2 x daily - 5 x weekly - 1 sets - 10 reps - 3s hold - Seated Flexion Stretch with Swiss Ball  - 2 x daily - 5 x weekly - 1 sets - 10 reps - 3s hold     ASSESSMENT:   CLINICAL IMPRESSION: Continues with little to no pain, added additional LE exercises as well as more core tasks using physioball.  Increased weight and resistance as noted to challenge patient.  He was able to complete all tasks w/o exacerbation of symptoms but fatigue reported in trunk and core muscle groups.   OBJECTIVE IMPAIRMENTS: decreased activity tolerance, difficulty walking, decreased balance, decreased endurance, decreased mobility, decreased ROM, decreased strength, impaired flexibility, impaired LE use, postural dysfunction, and pain.   ACTIVITY LIMITATIONS: bending, lifting, carry, locomotion, cleaning, community activity, driving, and or occupation   PERSONAL FACTORS: Arthritis, DM, DJD are also affecting patient's functional outcome.   REHAB POTENTIAL: Good   CLINICAL DECISION MAKING: Stable/uncomplicated   EVALUATION COMPLEXITY: Low       GOALS: Short term PT Goals Target date: 06/05/2022 Pt will be I and compliant with HEP. Baseline: [id number] Goal status: met   Long term PT goals Target date: 08/02/2022 Pt will improve ROM to [facility] to improve functional mobility Baseline: Goal status: Patient presents with ROM WFL for tasks required to complete ADLs and mobility, ongoing  Pt will improve hip/knee  strength to at least 4+/5 MMT to improve functional strength Baseline: MMT Right eval Left eval L 06/19/22  Hip flexion _0 Hip abduction       Knee flexion _1 Knee extension 4 2+ 4+   Goal status: Met  Pt will reduce pain by overall 50% overall with usual activity Baseline: 06/19/22 Pain described as soreness Goal status: Ongoing  Pt will reduce pain to overall less than 2-3/10 with transfers. Baseline: 06/19/22 No pain with transfers but requires multiple attempts to stand; 07/11/22 No pain over 1 week Goal status: Met  Pt will be able to ambulate community distances at least 150 ft with FWW without pain.  Baseline: 06/19/22 174f wirh RW, SBA across level ground; 07/11/22 36fwith RW and S Goal status: Ongoing   PLAN: PT FREQUENCY: 1 times per week    PT DURATION: 4 weeks   PLANNED INTERVENTIONS (unless contraindicated): aquatic PT, Canalith repositioning, cryotherapy, Electrical stimulation, Iontophoresis with 4 mg/ml dexamethasome, Moist heat, traction, Ultrasound, gait training, Therapeutic exercise, balance training, neuromuscular re-education, patient/family education, prosthetic training, manual techniques, passive ROM, dry needling, taping, vasopnuematic device, vestibular, spinal manipulations, joint manipulations   PLAN FOR NEXT SESSION: Assess for DC     JeLanice ShirtsPT 07/31/2022, 2:10 PM

## 2022-08-08 ENCOUNTER — Ambulatory Visit: Payer: Medicare PPO

## 2022-10-07 ENCOUNTER — Ambulatory Visit (INDEPENDENT_AMBULATORY_CARE_PROVIDER_SITE_OTHER): Payer: Medicare PPO | Admitting: Family Medicine

## 2022-10-07 ENCOUNTER — Encounter: Payer: Self-pay | Admitting: Family Medicine

## 2022-10-07 VITALS — BP 138/72 | HR 75 | Temp 97.6°F | Ht 73.0 in | Wt 390.0 lb

## 2022-10-07 DIAGNOSIS — E119 Type 2 diabetes mellitus without complications: Secondary | ICD-10-CM

## 2022-10-07 DIAGNOSIS — G8929 Other chronic pain: Secondary | ICD-10-CM

## 2022-10-07 DIAGNOSIS — G8250 Quadriplegia, unspecified: Secondary | ICD-10-CM | POA: Diagnosis not present

## 2022-10-07 DIAGNOSIS — M545 Low back pain, unspecified: Secondary | ICD-10-CM | POA: Diagnosis not present

## 2022-10-07 NOTE — Progress Notes (Signed)
Phone [phone] In person visit   Subjective:   Franklin Hicks is a 72 y.o. year old very pleasant male patient who presents for/with See problem oriented charting Chief Complaint  Patient presents with   Follow-up   Past Medical History-  Patient Active Problem List   Diagnosis Date Noted   Recurrent pulmonary embolism ([place])     Priority: High   C4 spinal cord injury, sequela ([place]) 12/18/2017    Priority: High   Tetraplegia ([place]) 12/18/2017    Priority: High   Aortic aneurysm ([place]) 12/16/2017    Priority: High   Cord compression ([place]) 12/13/2017    Priority: High   Chronic combined systolic and diastolic heart failure ([place]) 10/09/2016    Priority: High   CAD S/P percutaneous coronary angioplasty 09/20/2016    Priority: High   Morbid obesity ([place]) 07/18/2013    Priority: High   Diabetes mellitus type 2, controlled ([place]) 07/17/2010    Priority: High   Bilateral lower extremity edema 12/08/2008    Priority: High   Angioedema of lips 07/13/2013    Priority: Medium    BPH with urinary obstruction 07/17/2010    Priority: Medium    Gout 03/15/2007    Priority: Medium    Essential hypertension 03/15/2007    Priority: Medium    Pain of left heel     Priority: Low   Primary osteoarthritis of knees, bilateral 01/11/2016    Priority: Low   Erectile dysfunction 07/30/2015    Priority: Low   Multinodular goiter 07/13/2013    Priority: Low   SOB (shortness of breath) 10/02/2011    Priority: Low   NEUROPATHY, IDIOPATHIC PERIPHERAL NEC 05/31/2007    Priority: Low   Osteoarthritis 03/22/2007    Priority: Low   Preoperative cardiovascular examination 02/05/2021    Medications- reviewed and updated Current Outpatient Medications  Medication Sig Dispense Refill   acetaminophen (TYLENOL) 650 MG CR tablet Take 650-1,300 mg by mouth every 8 (eight) hours as needed for pain.     ammonium lactate (AMLACTIN) 12 % lotion Apply 1 application topically as needed for dry skin. 400 g  0   aspirin EC 81 MG tablet Take 1 tablet (81 mg total) by mouth daily. (Patient taking differently: Take 81 mg by mouth in the morning.) 90 tablet 3   atorvastatin (LIPITOR) 80 MG tablet TAKE 1 TABLET BY MOUTH DAILY AT 6 PM. 90 tablet 3   baclofen (LIORESAL) 20 MG tablet Take 1 tablet (20 mg total) by mouth 2 (two) times daily. 60 each 0   Chromium Picolinate (CHROMIUM PICOLATE PO) Take 1 tablet by mouth 3 (three) times a week.     diclofenac sodium (VOLTAREN) 1 % GEL Apply 1 application topically 3 (three) times daily. Both knees (Patient taking differently: Apply 1 application  topically 3 (three) times daily as needed (knee pain).) 3 Tube 4   empagliflozin (JARDIANCE) 10 MG TABS tablet Take 1 tablet (10 mg total) by mouth daily before breakfast. 30 tablet 5   fluticasone (FLONASE) 50 MCG/ACT nasal spray SPRAY 2 SPRAYS INTO EACH NOSTRIL EVERY DAY 48 mL 1   metoprolol tartrate (LOPRESSOR) 50 MG tablet Take 1 tablet (50 mg total) by mouth 2 (two) times daily. 180 tablet 3   Multiple Vitamin (MULTIVITAMIN WITH MINERALS) TABS tablet Take 1 tablet by mouth daily.     Potassium Citrate 15 MEQ (1620 MG) TBCR Take 1 tablet by mouth 2 (two) times daily.  11   rivaroxaban (XARELTO) 20  MG TABS tablet Take 1 tablet (20 mg total) by mouth daily with supper. 90 tablet 3   terazosin (HYTRIN) 10 MG capsule TAKE 1 CAPSULE BY MOUTH AT BEDTIME. 90 capsule 3   vitamin C (ASCORBIC ACID) 500 MG tablet Take 500 mg by mouth daily in the afternoon.     No current facility-administered medications for this visit.     Objective:  BP 138/72   Pulse 75   Temp 97.6 F (36.4 C)   Ht 6' 1"$  (1.854 m)   Wt (!) 390 lb (176.9 kg)   SpO2 96%   BMI 51.45 kg/m  Gen: NAD, resting comfortably CV: RRR no murmurs rubs or gallops Lungs: CTAB no crackles, wheeze, rhonchi Abdomen: soft/nontender/nondistended/normal bowel sounds. No rebound or guarding.  Ext: 1+ pitting edema Skin: warm, dry     Assessment and Plan   #  Diabetes/morbid obesity.  S:  controlled on no rx previously per goal 8 or less -did diabetes education and opted to hold off on medicine (jardiance sent in but not taking) -reached out to [facility] for support - trying to improve dietary intake- less carbs, more fish and veggies- more salads - morbid obesity status noted  Lab Results  Component Value Date   HGBA1C 8.3 (H) 07/01/2022   HGBA1C 7.9 (H) 10/18/2021   HGBA1C 8.6 (H) 07/04/2021   A/P: Diabetes poorly controlled last check- his personal goal has bene 8 or less though we discussed further benefit under 7- he wants to minimize meds and went to diabetes education and has tried to adjust his diet and plans to start at [place] -we opted to update a1c today and if elevated over 8 can reconsider jardiance that was already sent in and he has at home- he would strongly prefer to wait 14 more weeks unless a1c over 9 -some low back pain- check cmp as well as urinalysis (wants to hold off on ortho for now)  -tetraplegia noted- contributes to issues with exercise and likely even motivation- logn term challenge Morbid obesity noted- Encouraged need for healthy eating, regular exercise, weight loss.   #Podiatry visit pending 10/20/22  Recommended follow up: Return in about 14 weeks (around 01/13/2023) for followup or sooner if needed.Schedule b4 you leave. Future Appointments  Date Time Provider [place]  10/15/2022  3:15 PM Levester Fresh Gerarda Gunther, RD [facility] [facility]  10/20/2022  4:15 PM Marzetta Board, DPM [facility]    Lab/Order associations:   ICD-10-CM   1. Controlled type 2 diabetes mellitus without complication, without long-term current use of insulin (HCC)  E11.9 CBC with Differential/Platelet    Comprehensive metabolic panel    Hemoglobin A1c    2. Tetraplegia (HCC) Chronic G82.50     3. Morbid obesity (HCC) Chronic E66.01     4. Chronic bilateral low back pain without sciatica  M54.50 Urinalysis, Routine w  reflex microscopic   G89.29      No orders of the defined types were placed in this encounter.  Return precautions advised.  Garret Reddish, MD

## 2022-10-07 NOTE — Patient Instructions (Addendum)
Consider shingrix and Tdap at pharmacy  Please stop by lab before you go If you have mychart- we will send your results within 3 business days of [place] receiving them.  If you do not have mychart- we will call you about results within 5 business days of [place] receiving them.  *please also note that you will see labs on mychart as soon as they post. I will later go in and write notes on them- will say "notes from Dr. Yong Channel"   -we opted to update a1c today and if elevated over 8 can reconsider jardiance that was already sent in and he has at home  Recommended follow up: Return in about 14 weeks (around 01/13/2023) for followup or sooner if needed.Schedule b4 you leave.

## 2022-10-08 LAB — CBC WITH DIFFERENTIAL/PLATELET
Basophils Absolute: 0 10*3/uL (ref 0.0–0.1)
Basophils Relative: 0.5 % (ref 0.0–3.0)
Eosinophils Absolute: 0.2 10*3/uL (ref 0.0–0.7)
Eosinophils Relative: 2.3 % (ref 0.0–5.0)
HCT: 39.2 % (ref 39.0–52.0)
Hemoglobin: 12.9 g/dL — ABNORMAL LOW (ref 13.0–17.0)
Lymphocytes Relative: 20.4 % (ref 12.0–46.0)
Lymphs Abs: 1.6 10*3/uL (ref 0.7–4.0)
MCHC: 32.8 g/dL (ref 30.0–36.0)
MCV: 90.4 fl (ref 78.0–100.0)
Monocytes Absolute: 0.6 10*3/uL (ref 0.1–1.0)
Monocytes Relative: 7.8 % (ref 3.0–12.0)
Neutro Abs: 5.3 10*3/uL (ref 1.4–7.7)
Neutrophils Relative %: 69 % (ref 43.0–77.0)
Platelets: 247 10*3/uL (ref 150.0–400.0)
RBC: 4.34 Mil/uL (ref 4.22–5.81)
RDW: 15.1 % (ref 11.5–15.5)
WBC: 7.7 10*3/uL (ref 4.0–10.5)

## 2022-10-08 LAB — URINALYSIS, ROUTINE W REFLEX MICROSCOPIC
Bilirubin Urine: NEGATIVE
Hgb urine dipstick: NEGATIVE
Ketones, ur: NEGATIVE
Leukocytes,Ua: NEGATIVE
Nitrite: NEGATIVE
RBC / HPF: NONE SEEN (ref 0–?)
Specific Gravity, Urine: 1.025 (ref 1.000–1.030)
Total Protein, Urine: NEGATIVE
Urine Glucose: NEGATIVE
Urobilinogen, UA: 0.2 (ref 0.0–1.0)
pH: 6 (ref 5.0–8.0)

## 2022-10-08 LAB — COMPREHENSIVE METABOLIC PANEL
ALT: 16 U/L (ref 0–53)
AST: 16 U/L (ref 0–37)
Albumin: 3.4 g/dL — ABNORMAL LOW (ref 3.5–5.2)
Alkaline Phosphatase: 96 U/L (ref 39–117)
BUN: 19 mg/dL (ref 6–23)
CO2: 28 mEq/L (ref 19–32)
Calcium: 8.7 mg/dL (ref 8.4–10.5)
Chloride: 101 mEq/L (ref 96–112)
Creatinine, Ser: 1.09 mg/dL (ref 0.40–1.50)
GFR: 67.44 mL/min (ref >60.00)
Glucose, Bld: 121 mg/dL — ABNORMAL HIGH (ref 70–99)
Potassium: 4.7 mEq/L (ref 3.5–5.1)
Sodium: 136 mEq/L (ref 135–145)
Total Bilirubin: 0.5 mg/dL (ref 0.2–1.2)
Total Protein: 7.1 g/dL (ref 6.0–8.3)

## 2022-10-08 LAB — HEMOGLOBIN A1C: Hgb A1c MFr Bld: 7.7 % — ABNORMAL HIGH (ref 4.6–6.5)

## 2022-10-09 ENCOUNTER — Other Ambulatory Visit: Payer: Self-pay | Admitting: Family Medicine

## 2022-10-09 ENCOUNTER — Other Ambulatory Visit: Payer: Self-pay

## 2022-10-09 DIAGNOSIS — D72829 Elevated white blood cell count, unspecified: Secondary | ICD-10-CM

## 2022-10-15 ENCOUNTER — Ambulatory Visit: Payer: Medicare PPO | Admitting: Dietician

## 2022-10-20 ENCOUNTER — Ambulatory Visit: Payer: Medicare PPO | Admitting: Podiatry

## 2022-10-20 ENCOUNTER — Encounter: Payer: Self-pay | Admitting: Podiatry

## 2022-10-20 VITALS — BP 90/69

## 2022-10-20 DIAGNOSIS — L851 Acquired keratosis [keratoderma] palmaris et plantaris: Secondary | ICD-10-CM | POA: Diagnosis not present

## 2022-10-20 DIAGNOSIS — B353 Tinea pedis: Secondary | ICD-10-CM | POA: Diagnosis not present

## 2022-10-20 DIAGNOSIS — L988 Other specified disorders of the skin and subcutaneous tissue: Secondary | ICD-10-CM | POA: Diagnosis not present

## 2022-10-20 DIAGNOSIS — B351 Tinea unguium: Secondary | ICD-10-CM | POA: Diagnosis not present

## 2022-10-20 DIAGNOSIS — E1142 Type 2 diabetes mellitus with diabetic polyneuropathy: Secondary | ICD-10-CM | POA: Diagnosis not present

## 2022-10-20 DIAGNOSIS — M79674 Pain in right toe(s): Secondary | ICD-10-CM

## 2022-10-20 DIAGNOSIS — E119 Type 2 diabetes mellitus without complications: Secondary | ICD-10-CM | POA: Diagnosis not present

## 2022-10-20 DIAGNOSIS — M79675 Pain in left toe(s): Secondary | ICD-10-CM

## 2022-10-20 MED ORDER — CLOTRIMAZOLE-BETAMETHASONE 1-0.05 % EX CREA: TOPICAL_CREAM | CUTANEOUS | 3 refills | Status: AC

## 2022-10-20 NOTE — Patient Instructions (Addendum)
Apply Urea 20% Cream ([employer]) to hard callused skin on heels once daily.  Apply Lotrisone Cream to both feet twice daily.  You should get a phone call to schedule your Dermatology appointment.  Diabetes Mellitus and Foot Care Diabetes, also called diabetes mellitus, may cause problems with your feet and legs because of poor blood flow (circulation). Poor circulation may make your skin: Become thinner and drier. Break more easily. Heal more slowly. Peel and crack. You may also have nerve damage (neuropathy). This can cause decreased feeling in your legs and feet. This means that you may not notice minor injuries to your feet that could lead to more serious problems. Finding and treating problems early is the best way to prevent future foot problems. How to care for your feet Foot hygiene  Wash your feet daily with warm water and mild soap. Do not use hot water. Then, pat your feet and the areas between your toes until they are fully dry. Do not soak your feet. This can dry your skin. Trim your toenails straight across. Do not dig under them or around the cuticle. File the edges of your nails with an emery board or nail file. Apply a moisturizing lotion or petroleum jelly to the skin on your feet and to dry, brittle toenails. Use lotion that does not contain alcohol and is unscented. Do not apply lotion between your toes. Shoes and socks Wear clean socks or stockings every day. Make sure they are not too tight. Do not wear knee-high stockings. These may decrease blood flow to your legs. Wear shoes that fit well and have enough cushioning. Always look in your shoes before you put them on to be sure there are no objects inside. To break in new shoes, wear them for just a few hours a day. This prevents injuries on your feet. Wounds, scrapes, corns, and calluses  Check your feet daily for blisters, cuts, bruises, sores, and redness. If you cannot see the bottom of your feet, use a mirror or ask  someone for help. Do not cut off corns or calluses or try to remove them with medicine. If you find a minor scrape, cut, or break in the skin on your feet, keep it and the skin around it clean and dry. You may clean these areas with mild soap and water. Do not clean the area with peroxide, alcohol, or iodine. If you have a wound, scrape, corn, or callus on your foot, look at it several times a day to make sure it is healing and not infected. Check for: Redness, swelling, or pain. Fluid or blood. Warmth. Pus or a bad smell. General tips Do not cross your legs. This may decrease blood flow to your feet. Do not use heating pads or hot water bottles on your feet. They may burn your skin. If you have lost feeling in your feet or legs, you may not know this is happening until it is too late. Protect your feet from hot and cold by wearing shoes, such as at the beach or on hot pavement. Schedule a complete foot exam at least once a year or more often if you have foot problems. Report any cuts, sores, or bruises to your health care provider right away. Where to find more information American Diabetes Association: diabetes.org Association of Diabetes Care & Education Specialists: diabeteseducator.org Contact a health care provider if: You have a condition that increases your risk of infection, and you have any cuts, sores, or bruises on your  feet. You have an injury that is not healing. You have redness on your legs or feet. You feel burning or tingling in your legs or feet. You have pain or cramps in your legs and feet. Your legs or feet are numb. Your feet always feel cold. You have pain around any toenails. Get help right away if: You have a wound, scrape, corn, or callus on your foot and: You have signs of infection. You have a fever. You have a red line going up your leg. This information is not intended to replace advice given to you by your health care provider. Make sure you discuss any  questions you have with your health care provider. Document Revised: 02/12/2022 Document Reviewed: 02/12/2022 Elsevier Patient Education  [place].

## 2022-10-20 NOTE — Progress Notes (Signed)
ANNUAL DIABETIC FOOT EXAM  Subjective: Calix Mcneish Nuss presents today for diabetic foot evaluation.  Chief Complaint  Patient presents with   Nail Problem    [facility] PCP-Stephen Hunter PCP [facility]-2 weeks ago   Patient confirms h/o diabetes.  Patient relates 1 year h/o diabetes.  Patient denies any h/o foot wounds.  Patient endorses occasional numbness, tingling, burning in feet.  Risk factors: diabetes, neuropathy, h/o PE, h/o CVA, CAD, CHF.  Shelva Majestic, MD is patient's PCP.  Past Medical History:  Diagnosis Date   Arthritis    "knees" (12/17/2017)   CAD S/P percutaneous coronary angioplasty 09/20/2016   Nstemi 08/2016. Dr. Algie Coffer. DES- brilinta and asa. Requests change to [facility]; 95% Ramus -> PCI Resolute Onyx DES 2.75 x 18   Central cord syndrome (HCC) 12/17/2017   Chronic combined systolic and diastolic heart failure (HCC) 10/09/2016   EF 45% and grade II diastolic after nstemi   Diet-controlled diabetes mellitus (HCC)    DJD (degenerative joint disease)    Gout    High cholesterol    Hypertension    NSTEMI (non-ST elevated myocardial infarction) (HCC) 09/20/2016   95% Ramus - > PCI    Obesity    Recurrent pulmonary embolism (HCC) 11/'14; 4/'19   a) Bilateral segmental and subsegmental pulmonary emboli with mild RV Strain.; b) after fall with C-spine Fxr --> Acute PE of right main pulmonary artery extending into multiple segments.   Patient Active Problem List   Diagnosis Date Noted   Preoperative cardiovascular examination 02/05/2021   Recurrent pulmonary embolism (HCC)    Pain of left heel    C4 spinal cord injury, sequela (HCC) 12/18/2017   Tetraplegia (HCC) 12/18/2017   Aortic aneurysm (HCC) 12/16/2017   Cord compression (HCC) 12/13/2017   Chronic combined systolic and diastolic heart failure (HCC) 10/09/2016   CAD S/P percutaneous coronary angioplasty 09/20/2016   Primary osteoarthritis of knees, bilateral 01/11/2016   Erectile dysfunction 07/30/2015    Morbid obesity (HCC) 07/18/2013   Angioedema of lips 07/13/2013   Multinodular goiter 07/13/2013   SOB (shortness of breath) 10/02/2011   BPH with urinary obstruction 07/17/2010   Diabetes mellitus type 2, controlled (HCC) 07/17/2010   Bilateral lower extremity edema 12/08/2008   Edema 12/08/2008   NEUROPATHY, IDIOPATHIC PERIPHERAL NEC 05/31/2007   Osteoarthritis 03/22/2007   Gout 03/15/2007   Essential hypertension 03/15/2007   Past Surgical History:  Procedure Laterality Date   CARDIAC CATHETERIZATION N/A 09/19/2016   Procedure: Left Heart Cath and Coronary Angiography;  Surgeon: Orpah Cobb, MD;  Location: [facility];  Service: Cardiovascular: 95% proximal Ramus Intermedius --> PCI   CARDIAC CATHETERIZATION N/A 09/19/2016   Procedure: Coronary Stent Intervention;  Surgeon: Yvonne Kendall, MD;  Location: [facility];  Service: Cardiovascular: 95% ramus intermedius;  Resolute Onyx 2.75 x 18 mm drug-eluting stent   CYSTOSCOPY/RETROGRADE/URETEROSCOPY/STONE EXTRACTION WITH BASKET  1610,9604   ureteral stone    MENISCUS REPAIR Left    knee open meniscetomy   TRANSTHORACIC ECHOCARDIOGRAM  2004   no lvh nl ejection fraction   TRANSTHORACIC ECHOCARDIOGRAM  09/19/2016   In setting of NSTEMI:  EF 45-50% with diffuse hypokinesis. GR 2 DD. Mild biatrial enlargement.   Current Outpatient Medications on File Prior to Visit  Medication Sig Dispense Refill   acetaminophen (TYLENOL) 650 MG CR tablet Take 650-1,300 mg by mouth every 8 (eight) hours as needed for pain.     ammonium lactate (AMLACTIN) 12 % lotion Apply 1 application  topically as needed for dry skin. 400 g 0   aspirin EC 81 MG tablet Take 1 tablet (81 mg total) by mouth daily. (Patient taking differently: Take 81 mg by mouth in the morning.) 90 tablet 3   atorvastatin (LIPITOR) 80 MG tablet TAKE 1 TABLET BY MOUTH DAILY AT 6 PM. 90 tablet 3   baclofen (LIORESAL) 20 MG tablet Take 1 tablet (20 mg total) by mouth 2  (two) times daily. 60 each 0   Chromium Picolinate (CHROMIUM PICOLATE PO) Take 1 tablet by mouth 3 (three) times a week.     diclofenac sodium (VOLTAREN) 1 % GEL Apply 1 application topically 3 (three) times daily. Both knees (Patient taking differently: Apply 1 application  topically 3 (three) times daily as needed (knee pain).) 3 Tube 4   empagliflozin (JARDIANCE) 10 MG TABS tablet Take 1 tablet (10 mg total) by mouth daily before breakfast. 30 tablet 5   fluticasone (FLONASE) 50 MCG/ACT nasal spray SPRAY 2 SPRAYS INTO EACH NOSTRIL EVERY DAY 48 mL 1   metoprolol tartrate (LOPRESSOR) 50 MG tablet Take 1 tablet (50 mg total) by mouth 2 (two) times daily. 180 tablet 3   Multiple Vitamin (MULTIVITAMIN WITH MINERALS) TABS tablet Take 1 tablet by mouth daily.     Potassium Citrate 15 MEQ (1620 MG) TBCR Take 1 tablet by mouth 2 (two) times daily.  11   terazosin (HYTRIN) 10 MG capsule TAKE 1 CAPSULE BY MOUTH AT BEDTIME. 90 capsule 3   vitamin C (ASCORBIC ACID) 500 MG tablet Take 500 mg by mouth daily in the afternoon.     XARELTO 20 MG TABS tablet TAKE 1 TABLET BY MOUTH EVERY DAY WITH SUPPER 30 tablet 3   No current facility-administered medications on file prior to visit.    Allergies  Allergen Reactions   Ace Inhibitors     Angioedema   Other Hives and Itching    msg   Social History   Occupational History   Occupation: Runner, broadcasting/film/video  Tobacco Use   Smoking status: Former    Packs/day: 1.00    Years: 13.00    Total pack years: 13.00    Types: Cigarettes    Quit date: 07/13/1977    Years since quitting: 45.3   Smokeless tobacco: Never  Vaping Use   Vaping Use: Never used  Substance and Sexual Activity   Alcohol use: Yes    Alcohol/week: 0.0 standard drinks of alcohol    Comment: holidays only   Drug use: No   Sexual activity: Not on file   Family History  Problem Relation Age of Onset   Liver disease Mother    Immunization History  Administered Date(s) Administered   Fluad  Quad(high Dose 65+) 07/01/2022   Influenza Split 09/15/2011, 07/15/2012   Influenza Whole 06/25/1998, 05/31/2007, 07/17/2010   Influenza, High Dose Seasonal PF 09/24/2017   Influenza,inj,Quad PF,6+ Mos 07/14/2013, 07/30/2015   PFIZER Comirnaty(Gray Top)Covid-19 Tri-Sucrose Vaccine 09/27/2020, 02/28/2021   PFIZER(Purple Top)SARS-COV-2 Vaccination 01/12/2020, 02/03/2020   Td 08/26/1999, 12/08/2008     Review of Systems: Negative except as noted in the HPI.   Objective: Vitals:   10/20/22 1654  BP: 90/69   Cevon Ostergard Hilligoss is a pleasant 72 y.o. male in NAD. AAO X 3.  Vascular Examination: Capillary refill time immediate b/l. Vascular status intact b/l with palpable pedal pulses. Pedal hair absent b/l. Chronic venous insufficiency noted b/l LE with associated skin changes noted. No pain with calf compression b/l. Skin temperature gradient WNL  b/l. No ischemia or gangrene noted b/l LE. No cyanosis or clubbing noted b/l LE.  Neurological Examination: Sensation grossly intact b/l with 10 gram monofilament. Vibratory sensation intact b/l. Pt has subjective symptoms of neuropathy.  Dermatological Examination: No open wounds. No interdigital macerations.   Toenails 1-5 b/l thick, discolored, elongated with subungual debris and pain on dorsal palpation.                  Severe hyperkeratosis noted b/l heels with fissuring/deep cracks. No bleeding noted. He also has hyperpigmented plaques bilateral lower extremities extending from below knee to both ankles .  Musculoskeletal Examination: Muscle strength 3/5 to all lower extremity muscle groups bilaterally. Pes planus deformity noted bilateral LE. Utilizes wheelchair for mobility assistance.  Radiographs: None  Footwear Assessment: Does the patient wear appropriate shoes? Yes. Does the patient need inserts/orthotics? No.  Lab Results  Component Value Date   HGBA1C 7.7 (H) 10/07/2022   ADA Risk Categorization: Low Risk :   Patient has all of the following: Intact protective sensation No prior foot ulcer  No severe deformity Pedal pulses present  Assessment: 1. Acquired keratoderma   2. Pain due to onychomycosis of toenails of both feet   3. Skin plaque   4. Tinea pedis of both feet   5. Diabetic peripheral neuropathy associated with type 2 diabetes mellitus (HCC)   6. Encounter for diabetic foot exam (HCC)    Orders Placed This Encounter  Procedures   Ambulatory referral to Dermatology    Referral Priority:   Routine    Referral Type:   Consultation    Referral Reason:   Specialty Services Required    Requested Specialty:   Dermatology    Number of Visits Requested:   1   Meds ordered this encounter  Medications   clotrimazole-betamethasone (LOTRISONE) cream    Sig: Apply to both lower extremities twice daily.    Dispense:  45 g    Refill:  3  Plan: -Patient was evaluated and treated. All patient's and/or POA's questions/concerns answered on today's visit. -Discussed treatment plan for severely thickened keratoderma. Will start topical therapy and referral placed to Dermatology. Patient informed this will take some time due to thickness of lesions. He related understanding. -Patient to start applying Urea 20% Cream to callused heels b/l once daily. Use gloves for application. -Rx sent for Lotrisone cream to be applied bid to bilateral lower extremities. -Diabetic foot examination performed today. -Stressed the importance of good glycemic control and the detriment of not  controlling glucose levels in relation to the foot. -Discussed diabetic foot care principles. Literature dispensed on today. -Toenails 1-5 b/l were debrided in length and girth with dremel without iatrogenic bleeding.  -Patient/POA to call should there be question/concern in the interim. Return in about 3 months (around 01/18/2023).  Freddie Breech, DPM

## 2022-11-13 ENCOUNTER — Ambulatory Visit: Payer: Medicare PPO | Admitting: Dietician

## 2022-12-02 DIAGNOSIS — H5213 Myopia, bilateral: Secondary | ICD-10-CM | POA: Diagnosis not present

## 2022-12-02 DIAGNOSIS — E119 Type 2 diabetes mellitus without complications: Secondary | ICD-10-CM | POA: Diagnosis not present

## 2022-12-02 DIAGNOSIS — H40023 Open angle with borderline findings, high risk, bilateral: Secondary | ICD-10-CM | POA: Diagnosis not present

## 2022-12-02 DIAGNOSIS — H04123 Dry eye syndrome of bilateral lacrimal glands: Secondary | ICD-10-CM | POA: Diagnosis not present

## 2022-12-02 LAB — HM DIABETES EYE EXAM

## 2023-01-12 ENCOUNTER — Other Ambulatory Visit: Payer: Self-pay | Admitting: Family Medicine

## 2023-01-13 ENCOUNTER — Ambulatory Visit: Payer: Medicare PPO | Admitting: Family Medicine

## 2023-02-02 ENCOUNTER — Encounter: Payer: Self-pay | Admitting: Family Medicine

## 2023-02-02 ENCOUNTER — Ambulatory Visit: Payer: Medicare PPO | Admitting: Family Medicine

## 2023-02-02 VITALS — BP 130/70 | HR 67 | Temp 97.7°F | Ht 73.0 in

## 2023-02-02 DIAGNOSIS — I2699 Other pulmonary embolism without acute cor pulmonale: Secondary | ICD-10-CM

## 2023-02-02 DIAGNOSIS — E119 Type 2 diabetes mellitus without complications: Secondary | ICD-10-CM | POA: Diagnosis not present

## 2023-02-02 DIAGNOSIS — I5042 Chronic combined systolic (congestive) and diastolic (congestive) heart failure: Secondary | ICD-10-CM

## 2023-02-02 NOTE — Progress Notes (Signed)
Phone [phone] In person visit   Subjective:   Franklin Hicks is a 72 y.o. year old very pleasant male patient who presents for/with See problem oriented charting Chief Complaint  Patient presents with   Medical Management of Chronic Issues   Hypertension   Diabetes   Past Medical History-  Patient Active Problem List   Diagnosis Date Noted   Recurrent pulmonary embolism (HCC)     Priority: High   C4 spinal cord injury, sequela (HCC) 12/18/2017    Priority: High   Tetraplegia (HCC) 12/18/2017    Priority: High   Aortic aneurysm (HCC) 12/16/2017    Priority: High   Cord compression (HCC) 12/13/2017    Priority: High   Chronic combined systolic and diastolic heart failure (HCC) 10/09/2016    Priority: High   CAD S/P percutaneous coronary angioplasty 09/20/2016    Priority: High   Morbid obesity (HCC) 07/18/2013    Priority: High   Diabetes mellitus type 2, controlled (HCC) 07/17/2010    Priority: High   Bilateral lower extremity edema 12/08/2008    Priority: High   Angioedema of lips 07/13/2013    Priority: Medium    BPH with urinary obstruction 07/17/2010    Priority: Medium    Gout 03/15/2007    Priority: Medium    Essential hypertension 03/15/2007    Priority: Medium    Pain of left heel     Priority: Low   Primary osteoarthritis of knees, bilateral 01/11/2016    Priority: Low   Erectile dysfunction 07/30/2015    Priority: Low   Multinodular goiter 07/13/2013    Priority: Low   SOB (shortness of breath) 10/02/2011    Priority: Low   NEUROPATHY, IDIOPATHIC PERIPHERAL NEC 05/31/2007    Priority: Low   Osteoarthritis 03/22/2007    Priority: Low   Preoperative cardiovascular examination 02/05/2021   Edema 12/08/2008    Medications- reviewed and updated Current Outpatient Medications  Medication Sig Dispense Refill   acetaminophen (TYLENOL) 650 MG CR tablet Take 650-1,300 mg by mouth every 8 (eight) hours as needed for pain.     ammonium lactate  (AMLACTIN) 12 % lotion Apply 1 application topically as needed for dry skin. 400 g 0   aspirin EC 81 MG tablet Take 1 tablet (81 mg total) by mouth daily. (Patient taking differently: Take 81 mg by mouth in the morning.) 90 tablet 3   atorvastatin (LIPITOR) 80 MG tablet TAKE 1 TABLET BY MOUTH DAILY AT 6 PM. 90 tablet 3   baclofen (LIORESAL) 20 MG tablet Take 1 tablet (20 mg total) by mouth 2 (two) times daily. 60 each 0   Chromium Picolinate (CHROMIUM PICOLATE PO) Take 1 tablet by mouth 3 (three) times a week.     clotrimazole-betamethasone (LOTRISONE) cream Apply to both lower extremities twice daily. 45 g 3   diclofenac sodium (VOLTAREN) 1 % GEL Apply 1 application topically 3 (three) times daily. Both knees (Patient taking differently: Apply 1 application  topically 3 (three) times daily as needed (knee pain).) 3 Tube 4   empagliflozin (JARDIANCE) 10 MG TABS tablet Take 1 tablet (10 mg total) by mouth daily before breakfast. 30 tablet 5   fluticasone (FLONASE) 50 MCG/ACT nasal spray SPRAY 2 SPRAYS INTO EACH NOSTRIL EVERY DAY 48 mL 1   metoprolol tartrate (LOPRESSOR) 50 MG tablet Take 1 tablet (50 mg total) by mouth 2 (two) times daily. 180 tablet 3   Multiple Vitamin (MULTIVITAMIN WITH MINERALS) TABS tablet Take 1 tablet by  mouth daily.     Potassium Citrate 15 MEQ (1620 MG) TBCR Take 1 tablet by mouth 2 (two) times daily.  11   terazosin (HYTRIN) 10 MG capsule TAKE 1 CAPSULE BY MOUTH EVERYDAY AT BEDTIME 90 capsule 3   vitamin C (ASCORBIC ACID) 500 MG tablet Take 500 mg by mouth daily in the afternoon.     XARELTO 20 MG TABS tablet TAKE 1 TABLET BY MOUTH EVERY DAY WITH SUPPER 30 tablet 3   No current facility-administered medications for this visit.     Objective:  BP 130/70   Pulse 67   Temp 97.7 F (36.5 C)   Ht 6\' 1"  (1.854 m)   SpO2 95%   BMI 51.45 kg/m  Gen: NAD, resting comfortably CV: RRR no murmurs rubs or gallops Lungs: CTAB no crackles, wheeze, rhonchi Abdomen:  soft/nontender/nondistended/normal bowel sounds. No rebound or guarding.  Ext: trace edema under hyperkeratosis  Skin: warm, dry     Assessment and Plan   # back pain better lately thankfully- not needing baclofen lately -he wants to hold off on last urine culture- mainly tested urine due to back pain at that time  # Recurrent pulmonary embolism  S: Prior DVT 2014 after traveling- believe led to PE. Recurrent PE 11/2017 after fall. Now on lifelong anticoagulation-Xarelto 20 mg A/P: doing well  on xarelto without recurrence- continue current medications    #Chronic systolic and diastolic heart failure/hypertension S: Compliant lasix now  3x a week if needed but no incrased swelling , metoprolol 50mg  BID, terazosin 10mg  (also helps with BPH). Takes potassium through urology For CHF-  Reports no weight gain and no edema  A/P: blood pressure reasonably well controlled cccme  CHF appears euvolemic - continue current medications    #CAD/hyperlipidemia/aortic aneurysm. S: CAD followed by Dr. Herbie Baltimore with history of NSTEMI.  Compliant with aspirin.  Also on Xarelto for recurrent PULMONARY EMBOLISM (stable without recurrence).  Dr. Herbie Baltimore follows aortic aneurysm yearly with imaging- last checked 04/24/22 and stable at 43 mm  - no chest pain or shortness of breath   On last check- #s looked great on atorvastatin 80mg  daily in the past Lab Results  Component Value Date   CHOL 103 07/01/2022   HDL 36.70 (L) 07/01/2022   LDLCALC 52 07/01/2022   LDLDIRECT 72.0 10/09/2016   TRIG 71.0 07/01/2022   CHOLHDL 3 07/01/2022   A/P: lipids looked great last check with LDL under 70- continue current medications Coronary artery disease asymptomatic continue current medications Aortic aneursym thoracic stable on August imaging and will be checked again by cardiology yearly    # Diabetes/morbid obesity.  S:  controlled on no rx previously. [place] Pepper is on his medication(s) list but not yet started  For  morbid obesity and diabetes exercise and diet- trying to watch his sugar and pasta- has cut back he reports in general - doing more salads and chicken or fish.  Does some limited exercise around the house- same on this  Lab Results  Component Value Date   HGBA1C 7.7 (H) 10/07/2022   HGBA1C 8.3 (H) 07/01/2022   HGBA1C 7.9 (H) 10/18/2021  A/P: hopefully improved- update a1c today. Continue without meds for now  but may start jardiance 10 mg if above 8 again- he prefers if under this to work on lifestyle  #Health maintenance - declines for now but ok with me asking next visit about cologuard again  #Hyperkeratosis- saw podiatry 10/20/22- referred to derm and started on lotrisone- mild  to no improvement and dermatology unfortunately a year wait   Recommended follow up: Return in about 4 months (around 06/04/2023) for followup or sooner if needed.Schedule b4 you leave. Future Appointments  Date Time Provider Department Center  02/04/2023  3:15 PM Freddie Breech, [place] [facility]  10/29/2023  2:30 PM Deirdre Evener, [place] [facility] None   Lab/Order associations:   ICD-10-CM   1. Controlled type 2 diabetes mellitus without complication, without long-term current use of insulin (HCC)  E11.9     2. Recurrent pulmonary embolism (HCC) Chronic I26.99     3. Chronic combined systolic and diastolic heart failure (HCC) Chronic I50.42       No orders of the defined types were placed in this encounter.   Return precautions advised.  Tana Conch, MD

## 2023-02-02 NOTE — Patient Instructions (Addendum)
Health Maintenance Due  Topic Date Due   Pneumonia Vaccine 30+ Years old (1 of 2 - PCV)- consider in office at later date Never done   Zoster Vaccines- Shingrix (1 of 2)- consider at pharmacy Never done   DTaP/Tdap/Td (3 - Tdap)- consider at [facility] 12/09/2018   Please stop by lab before you go If you have mychart- we will send your results within 3 business days of [place] receiving them.  If you do not have mychart- we will call you about results within 5 business days of [place] receiving them.  *please also note that you will see labs on mychart as soon as they post. I will later go in and write notes on them- will say "notes from Dr. Durene Cal"   Recommended follow up: Return in about 14 weeks (around 05/11/2023) for followup or sooner if needed.Schedule b4 you leave. -try September 11th or later

## 2023-02-03 LAB — COMPREHENSIVE METABOLIC PANEL
ALT: 15 U/L (ref 0–53)
AST: 16 U/L (ref 0–37)
Albumin: 3.3 g/dL — ABNORMAL LOW (ref 3.5–5.2)
Alkaline Phosphatase: 94 U/L (ref 39–117)
BUN: 23 mg/dL (ref 6–23)
CO2: 28 mEq/L (ref 19–32)
Calcium: 8.6 mg/dL (ref 8.4–10.5)
Chloride: 102 mEq/L (ref 96–112)
Creatinine, Ser: 1.32 mg/dL (ref 0.40–1.50)
GFR: 53.47 mL/min — ABNORMAL LOW (ref >60.00)
Glucose, Bld: 115 mg/dL — ABNORMAL HIGH (ref 70–99)
Potassium: 4.4 mEq/L (ref 3.5–5.1)
Sodium: 136 mEq/L (ref 135–145)
Total Bilirubin: 0.6 mg/dL (ref 0.2–1.2)
Total Protein: 6.8 g/dL (ref 6.0–8.3)

## 2023-02-03 LAB — CBC WITH DIFFERENTIAL/PLATELET
Basophils Absolute: 0 10*3/uL (ref 0.0–0.1)
Basophils Relative: 0.4 % (ref 0.0–3.0)
Eosinophils Absolute: 0.2 10*3/uL (ref 0.0–0.7)
Eosinophils Relative: 2.4 % (ref 0.0–5.0)
HCT: 40.4 % (ref 39.0–52.0)
Hemoglobin: 13 g/dL (ref 13.0–17.0)
Lymphocytes Relative: 19.5 % (ref 12.0–46.0)
Lymphs Abs: 1.5 10*3/uL (ref 0.7–4.0)
MCHC: 32.1 g/dL (ref 30.0–36.0)
MCV: 90.4 fl (ref 78.0–100.0)
Monocytes Absolute: 0.7 10*3/uL (ref 0.1–1.0)
Monocytes Relative: 8.3 % (ref 3.0–12.0)
Neutro Abs: 5.5 10*3/uL (ref 1.4–7.7)
Neutrophils Relative %: 69.4 % (ref 43.0–77.0)
Platelets: 237 10*3/uL (ref 150.0–400.0)
RBC: 4.46 Mil/uL (ref 4.22–5.81)
RDW: 15 % (ref 11.5–15.5)
WBC: 7.9 10*3/uL (ref 4.0–10.5)

## 2023-02-03 LAB — HEMOGLOBIN A1C: Hgb A1c MFr Bld: 7.5 % — ABNORMAL HIGH (ref 4.6–6.5)

## 2023-02-04 ENCOUNTER — Encounter: Payer: Self-pay | Admitting: Podiatry

## 2023-02-04 ENCOUNTER — Ambulatory Visit: Payer: Medicare PPO | Admitting: Podiatry

## 2023-02-04 VITALS — BP 138/61 | HR 67

## 2023-02-04 DIAGNOSIS — L988 Other specified disorders of the skin and subcutaneous tissue: Secondary | ICD-10-CM | POA: Diagnosis not present

## 2023-02-04 DIAGNOSIS — M79675 Pain in left toe(s): Secondary | ICD-10-CM

## 2023-02-04 DIAGNOSIS — B351 Tinea unguium: Secondary | ICD-10-CM

## 2023-02-04 DIAGNOSIS — L851 Acquired keratosis [keratoderma] palmaris et plantaris: Secondary | ICD-10-CM | POA: Diagnosis not present

## 2023-02-04 DIAGNOSIS — M79674 Pain in right toe(s): Secondary | ICD-10-CM | POA: Diagnosis not present

## 2023-02-04 DIAGNOSIS — E1142 Type 2 diabetes mellitus with diabetic polyneuropathy: Secondary | ICD-10-CM | POA: Diagnosis not present

## 2023-02-08 NOTE — Progress Notes (Signed)
  Subjective:  Patient ID: Franklin Hicks, male    DOB: 04-01-1951,  MRN: [id number]  [name] presents to clinic today for at risk foot care with history of diabetic neuropathy and painful thick toenails that are difficult to trim. Pain interferes with ambulation. Aggravating factors include wearing enclosed shoe gear. Pain is relieved with periodic professional debridement.  Patient has severe hyperkeratoses and skin plaques bilaterally. States he has not seen Dermatology due to long wait to get appointment; the appointment was for early 2025. He did speak to PCP and another Derm practice was suggested, but he does not remember the name of the practice. Chief Complaint  Patient presents with   NAIL CARE    RFC WHEELCHAIR   New problem(s): None.   PCP is Shelva Majestic, MD.  Allergies  Allergen Reactions   Ace Inhibitors     Angioedema   Other Hives and Itching    msg    Review of Systems: Negative except as noted in the HPI.  Objective: No changes noted in today's physical examination. Vitals:   02/04/23 1537  BP: 138/61  Pulse: 67   GERMAINE Hicks is a pleasant 72 y.o. male morbidly obese in NAD. AAO x 3.  Vascular Examination: Capillary refill time immediate b/l. Vascular status intact b/l with palpable pedal pulses. Pedal hair absent b/l. Chronic venous insufficiency noted b/l LE with associated skin changes noted. No pain with calf compression b/l. Skin temperature gradient WNL b/l. No ischemia or gangrene noted b/l LE. No cyanosis or clubbing noted b/l LE.  Neurological Examination: Sensation grossly intact b/l with 10 gram monofilament. Vibratory sensation intact b/l. Pt has subjective symptoms of neuropathy.  Dermatological Examination: No open wounds. No interdigital macerations.   Toenails 1-5 b/l thick, discolored, elongated with subungual debris and pain on dorsal palpation.   Severe hyperkeratosis noted b/l heels with fissuring/deep cracks. No bleeding noted.  He also has hyperpigmented plaques bilateral lower extremities extending from below knee to both ankles .  Musculoskeletal Examination: Muscle strength 3/5 to all lower extremity muscle groups bilaterally. Pes planus deformity noted bilateral LE. Utilizes wheelchair for mobility assistance.  Radiographs: None  Assessment/Plan: 1. Pain due to onychomycosis of toenails of both feet   2. Skin plaque   3. Diabetic peripheral neuropathy associated with type 2 diabetes mellitus (HCC)   -Patient was evaluated and treated. All patient's and/or POA's questions/concerns answered on today's visit. -Advised patient to coordinate Dermatology Consult with PCP; he related understanding. -Recommended weekly foot hygiene soaks with Dial antibacterial soap. Dry feet well and apply moisturizer. -Continue foot and shoe inspections daily. Monitor blood glucose per PCP/Endocrinologist's recommendations. -Toenails 1-5 b/l were debrided in length and girth with sterile nail nippers and dremel without iatrogenic bleeding.  -Patient/POA to call should there be question/concern in the interim.   Return in about 3 months (around 05/07/2023).  Freddie Breech, DPM

## 2023-02-23 ENCOUNTER — Encounter: Payer: Self-pay | Admitting: Family Medicine

## 2023-02-24 ENCOUNTER — Other Ambulatory Visit: Payer: Self-pay

## 2023-02-24 DIAGNOSIS — L84 Corns and callosities: Secondary | ICD-10-CM

## 2023-04-09 ENCOUNTER — Encounter (INDEPENDENT_AMBULATORY_CARE_PROVIDER_SITE_OTHER): Payer: Self-pay

## 2023-04-10 ENCOUNTER — Other Ambulatory Visit: Payer: Self-pay | Admitting: Physician Assistant

## 2023-04-10 DIAGNOSIS — E1159 Type 2 diabetes mellitus with other circulatory complications: Secondary | ICD-10-CM

## 2023-04-10 DIAGNOSIS — I5042 Chronic combined systolic (congestive) and diastolic (congestive) heart failure: Secondary | ICD-10-CM

## 2023-04-10 DIAGNOSIS — I2510 Atherosclerotic heart disease of native coronary artery without angina pectoris: Secondary | ICD-10-CM

## 2023-04-13 ENCOUNTER — Telehealth: Payer: Self-pay | Admitting: Cardiology

## 2023-04-13 DIAGNOSIS — I5042 Chronic combined systolic (congestive) and diastolic (congestive) heart failure: Secondary | ICD-10-CM

## 2023-04-13 DIAGNOSIS — Z9861 Coronary angioplasty status: Secondary | ICD-10-CM

## 2023-04-13 DIAGNOSIS — E1159 Type 2 diabetes mellitus with other circulatory complications: Secondary | ICD-10-CM

## 2023-04-13 MED ORDER — METOPROLOL TARTRATE 50 MG PO TABS: 50.0000 mg | ORAL_TABLET | Freq: Two times a day (BID) | ORAL | 0 refills | Status: DC

## 2023-04-13 NOTE — Telephone Encounter (Signed)
*  STAT* If patient is at the pharmacy, call can be transferred to refill team.   1. Which medications need to be refilled? (please list name of each medication and dose if known)  Metoprolol    2. Would you like to learn more about the convenience, safety, & potential cost savings by using the [facility]?     3. Are you open to using the [facility] (Type [facility].   4. Which pharmacy/location (including street and city if local pharmacy) is medication to be sent to? [facility] [address], [place]   5. Do they need a 30 day or 90 day supply? Enough until his appointment on [phone] days and refills

## 2023-04-13 NOTE — Telephone Encounter (Signed)
Called patient confirmed pharmacy and sent enough medication to get patient to next appointment.

## 2023-04-20 DIAGNOSIS — L853 Xerosis cutis: Secondary | ICD-10-CM | POA: Diagnosis not present

## 2023-05-12 ENCOUNTER — Ambulatory Visit: Payer: Medicare PPO | Admitting: Family Medicine

## 2023-05-18 ENCOUNTER — Encounter: Payer: Medicare PPO | Admitting: Podiatry

## 2023-05-19 NOTE — Progress Notes (Signed)
Patient was a no show for today's scheduled appt.

## 2023-05-20 ENCOUNTER — Other Ambulatory Visit: Payer: Self-pay | Admitting: Cardiology

## 2023-05-20 DIAGNOSIS — I2510 Atherosclerotic heart disease of native coronary artery without angina pectoris: Secondary | ICD-10-CM

## 2023-05-20 DIAGNOSIS — E1159 Type 2 diabetes mellitus with other circulatory complications: Secondary | ICD-10-CM

## 2023-05-20 DIAGNOSIS — I5042 Chronic combined systolic (congestive) and diastolic (congestive) heart failure: Secondary | ICD-10-CM

## 2023-05-20 DIAGNOSIS — I152 Hypertension secondary to endocrine disorders: Secondary | ICD-10-CM

## 2023-05-21 NOTE — Progress Notes (Signed)
Cardiology Clinic Note   Patient Name: Franklin Hicks Date of Encounter: 05/22/2023  Primary Care Provider:  Shelva Majestic, MD Primary Cardiologist:  Franklin Lemma, MD  Patient Profile    72 year old male hx of CAD, central cord syndrome, chronic combined systolic and diastolic heart failure, chronic LBBB, hypertension, hyperlipidemia, and recurrent PE on Xarelto.  Previously underwent cardiac catheterization in 2018 which revealed single-vessel disease, patient was treated with DES to ramus intermedius.  Echocardiogram obtained on 09/13/2016 showed EF 45 to 50%, grade 2 DD.     Past Medical History    Past Medical History:  Diagnosis Date   Arthritis    "knees" (12/17/2017)   CAD S/P percutaneous coronary angioplasty 09/20/2016   Nstemi 08/2016. Dr. Algie Hicks. DES- brilinta and asa. Requests change to [facility] cardiology; 95% Ramus -> PCI Resolute Onyx DES 2.75 x 18   Central cord syndrome (HCC) 12/17/2017   Chronic combined systolic and diastolic heart failure (HCC) 10/09/2016   EF 45% and grade II diastolic after nstemi   Diet-controlled diabetes mellitus (HCC)    DJD (degenerative joint disease)    Gout    High cholesterol    Hypertension    NSTEMI (non-ST elevated myocardial infarction) (HCC) 09/20/2016   95% Ramus - > PCI    Obesity    Recurrent pulmonary embolism (HCC) 11/'14; 4/'19   a) Bilateral segmental and subsegmental pulmonary emboli with mild RV Strain.; b) after fall with C-spine Fxr --> Acute PE of right main pulmonary artery extending into multiple segments.   Past Surgical History:  Procedure Laterality Date   CARDIAC CATHETERIZATION N/A 09/19/2016   Procedure: Left Heart Cath and Coronary Angiography;  Surgeon: Franklin Cobb, MD;  Location: [facility];  Service: Cardiovascular: 95% proximal Ramus Intermedius --> PCI   CARDIAC CATHETERIZATION N/A 09/19/2016   Procedure: Coronary Stent Intervention;  Surgeon: Franklin Kendall, MD;  Location: [facility];   Service: Cardiovascular: 95% ramus intermedius;  Resolute Onyx 2.75 x 18 mm drug-eluting stent   CYSTOSCOPY/RETROGRADE/URETEROSCOPY/STONE EXTRACTION WITH BASKET  5284,1324   ureteral stone    MENISCUS REPAIR Left    knee open meniscetomy   TRANSTHORACIC ECHOCARDIOGRAM  2004   no lvh nl ejection fraction   TRANSTHORACIC ECHOCARDIOGRAM  09/19/2016   In setting of NSTEMI:  EF 45-50% with diffuse hypokinesis. GR 2 DD. Mild biatrial enlargement.    Allergies  Allergies  Allergen Reactions   Ace Inhibitors     Angioedema   Other Hives and Itching    msg    History of Present Illness    [name] is a very pleasant gentleman who returns to the office today for ongoing assessment and management of chronic combined diastolic and systolic heart failure, CAD status post PCI, and hyperlipidemia. hypertension, with other history to include diabetes followed by primary care, he comes today mainly for annual follow-up and for refills on his metoprolol and Xarelto.  He denies any chest pain, he does have some mild dyspnea on exertion due to morbid obesity and sedentary lifestyle.  He has bilateral pain in his knees which makes it difficult for him to ambulate.  He denies any bleeding issues such as hemoptysis, epistaxis, or melena.  He denies any new hospitalizations, allergies, or diagnoses.  Home Medications    Current Outpatient Medications  Medication Sig Dispense Refill   acetaminophen (TYLENOL) 650 MG CR tablet Take 650-1,300 mg by mouth every 8 (eight) hours as needed for pain.  ammonium lactate (AMLACTIN) 12 % lotion Apply 1 application topically as needed for dry skin. 400 g 0   aspirin EC 81 MG tablet Take 1 tablet (81 mg total) by mouth daily. (Patient taking differently: Take 81 mg by mouth in the morning.) 90 tablet 3   atorvastatin (LIPITOR) 80 MG tablet TAKE 1 TABLET BY MOUTH DAILY AT 6 PM. 90 tablet 3   baclofen (LIORESAL) 20 MG tablet Take 1 tablet (20 mg total) by mouth 2 (two)  times daily. 60 each 0   Chromium Picolinate (CHROMIUM PICOLATE PO) Take 1 tablet by mouth 3 (three) times a week.     clotrimazole-betamethasone (LOTRISONE) cream Apply to both lower extremities twice daily. 45 g 3   empagliflozin (JARDIANCE) 10 MG TABS tablet Take 1 tablet (10 mg total) by mouth daily before breakfast. 30 tablet 5   fluticasone (FLONASE) 50 MCG/ACT nasal spray SPRAY 2 SPRAYS INTO EACH NOSTRIL EVERY DAY 48 mL 1   Multiple Vitamin (MULTIVITAMIN WITH MINERALS) TABS tablet Take 1 tablet by mouth daily.     Potassium Citrate 15 MEQ (1620 MG) TBCR Take 1 tablet by mouth 2 (two) times daily.  11   terazosin (HYTRIN) 10 MG capsule TAKE 1 CAPSULE BY MOUTH EVERYDAY AT BEDTIME 90 capsule 3   vitamin C (ASCORBIC ACID) 500 MG tablet Take 500 mg by mouth daily in the afternoon.     diclofenac sodium (VOLTAREN) 1 % GEL Apply 1 application topically 3 (three) times daily. Both knees (Patient not taking: Reported on 05/22/2023) 3 Tube 4   metoprolol tartrate (LOPRESSOR) 50 MG tablet Take 1 tablet (50 mg total) by mouth 2 (two) times daily. 180 tablet 3   rivaroxaban (XARELTO) 20 MG TABS tablet Take 1 tablet (20 mg total) by mouth daily. 30 tablet 11   No current facility-administered medications for this visit.     Family History    Family History  Problem Relation Age of Onset   Liver disease Mother    He indicated that his mother is deceased. He indicated that his father is deceased. He indicated that his maternal grandmother is deceased. He indicated that his maternal grandfather is deceased. He indicated that his paternal grandmother is deceased. He indicated that his paternal grandfather is deceased. He indicated that his daughter is alive. He indicated that both of his sons are alive.  Social History    Social History   Socioeconomic History   Marital status: Widowed    Spouse name: Not on file   Number of children: Not on file   Years of education: Not on file   Highest  education level: Not on file  Occupational History   Occupation: teacher  Tobacco Use   Smoking status: Former    Current packs/day: 0.00    Average packs/day: 1 pack/day for 13.0 years (13.0 ttl pk-yrs)    Types: Cigarettes    Start date: 07/13/1964    Quit date: 07/13/1977    Years since quitting: 45.8   Smokeless tobacco: Never  Vaping Use   Vaping status: Never Used  Substance and Sexual Activity   Alcohol use: Yes    Alcohol/week: 0.0 standard drinks of alcohol    Comment: holidays only   Drug use: No   Sexual activity: Not on file  Other Topics Concern   Not on file  Social History Narrative   Widowed 02/2020 after over 40 years married.  He has 3 children and 5 grandchildren. They also 5 great grandchildren.  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   Social Determinants of Health   Financial Resource Strain: Low Risk  (07/07/2022)   Overall Financial Resource Strain (CARDIA)    Difficulty of Paying Living Expenses: Not hard at all  Food Insecurity: No Food Insecurity (07/09/2022)   Hunger Vital Sign    Worried About Running Out of Food in the Last Year: Never true    Ran Out of Food in the Last Year: Never true  Transportation Needs: No Transportation Needs (07/07/2022)   PRAPARE - Administrator, Civil Service (Medical): No    Lack of Transportation (Non-Medical): No  Physical Activity: Inactive (07/07/2022)   Exercise Vital Sign    Days of Exercise per Week: 0 days    Minutes of Exercise per Session: 0 min  Stress: No Stress Concern Present (07/07/2022)   [employer] of Occupational Health - Occupational Stress Questionnaire    Feeling of Stress : Not at all  Social Connections: Moderately Isolated (07/07/2022)   Social Connection and Isolation Panel [NHANES]    Frequency of Communication with Friends and Family: More than three times a week    Frequency of Social Gatherings with Friends and Family:  More than three times a week    Attends Religious Services: More than 4 times per year    Active Member of [employer] or Organizations: No    Attends Banker Meetings: Never    Marital Status: Widowed  Intimate Partner Violence: Not At Risk (07/07/2022)   Humiliation, Afraid, Rape, and Kick questionnaire    Fear of Current or Ex-Partner: No    Emotionally Abused: No    Physically Abused: No    Sexually Abused: No     Review of Systems    General:  No chills, fever, night sweats or weight changes.  Cardiovascular:  No chest pain, mild dyspnea on exertion, no edema, orthopnea, palpitations, paroxysmal nocturnal dyspnea. Dermatological: No rash, lesions/masses Respiratory: No cough, dyspnea Urologic: No hematuria, dysuria Abdominal:   No nausea, vomiting, diarrhea, bright red blood per rectum, melena, or hematemesis Neurologic:  No visual changes, wkns, changes in mental status. All other systems reviewed and are otherwise negative except as noted above.       Physical Exam    VS:  BP 134/66 (BP Location: Left Arm, Patient Position: Sitting, Cuff Size: Large)   Pulse 71   Ht 6' (1.829 m)   Wt (!) 389 lb (176.4 kg)   SpO2 95%   BMI 52.76 kg/m  , BMI Body mass index is 52.76 kg/m.     GEN: Well nourished, well developed, in no acute distress.  Morbidly obese HEENT: normal. Neck: Supple, no JVD, carotid bruits, or masses. Cardiac: RRR, occasional extrasystole, no murmurs, rubs, or gallops. No clubbing, cyanosis, bilateral lower extremity pretibial woody edema.  Radials/2+, DP/PT 1+ and equal bilaterally.  Respiratory:  Respirations regular and unlabored, clear to auscultation bilaterally. GI: Soft, nontender, nondistended, BS + x 4. MS: no deformity or atrophy. Skin: warm and dry, no rash. Neuro:  Strength and sensation are intact. Psych: Normal affect.      Lab Results  Component Value Date   WBC 7.9 02/02/2023   HGB 13.0 02/02/2023   HCT 40.4 02/02/2023    MCV 90.4 02/02/2023   PLT 237.0 02/02/2023   Lab Results  Component Value Date   CREATININE 1.32 02/02/2023   BUN 23 02/02/2023   NA 136 02/02/2023   K  4.4 02/02/2023   CL 102 02/02/2023   CO2 28 02/02/2023   Lab Results  Component Value Date   ALT 15 02/02/2023   AST 16 02/02/2023   ALKPHOS 94 02/02/2023   BILITOT 0.6 02/02/2023   Lab Results  Component Value Date   CHOL 103 07/01/2022   HDL 36.70 (L) 07/01/2022   LDLCALC 52 07/01/2022   LDLDIRECT 72.0 10/09/2016   TRIG 71.0 07/01/2022   CHOLHDL 3 07/01/2022    Lab Results  Component Value Date   HGBA1C 7.5 (H) 02/02/2023     Review of Prior Studies    Echocardiogram 04/23/2022  1. Left ventricular ejection fraction, by estimation, is 50%. The left  ventricle has mildly decreased function. The left ventricle demonstrates  global hypokinesis. The left ventricular internal cavity size was mildly  dilated. There is mild left  ventricular hypertrophy. Left ventricular diastolic parameters are  consistent with Grade I diastolic dysfunction (impaired relaxation).   2. Right ventricular systolic function is normal. The right ventricular  size is normal. There is normal pulmonary artery systolic pressure. The  estimated right ventricular systolic pressure is 30.9 mmHg.   3. The mitral valve is normal in structure. Trivial mitral valve  regurgitation. No evidence of mitral stenosis.   4. The aortic valve is tricuspid. There is mild calcification of the  aortic valve. Aortic valve regurgitation is not visualized. No aortic  stenosis is present.   5. Aortic dilatation noted. There is mild dilatation of the ascending  aorta, measuring 43 mm.   6. The inferior vena cava is normal in size with greater than 50%  respiratory variability, suggesting right atrial pressure of 3 mmHg.   7. Technically difficult study with very poor acoustic windows. RV only  visualized in subcostal images.   Assessment & Plan   1.  History of  chronic combined systolic diastolic heart failure: Most recent echocardiogram 1 year ago reveals normal LV systolic function, with grade 1 diastolic dysfunction.  The patient is stable from a cardiac standpoint.  He is no longer taking Jardiance.  He is not on any diuretics currently.  He continues low-sodium diet.  No changes to regimen at this time.  2.  History of recurrent pulmonary emboli.  Continues on Xarelto 20 mg daily.  Refills are provided.  3.  Coronary artery disease: PCI to ramus intermediate with resolute Onyx 2.5 x 18 mm DES in the setting of a 95% ramus intermediate stenosis.  The patient continues on metoprolol 50 mg twice daily, refills are provided, aspirin 81 mg daily, and statin therapy.  4.  Hypercholesterolemia: Remains on atorvastatin 80 mg daily.  Labs are followed by primary care provider as he is seeing this practice a minimum of twice a year.  5.  AAA: Per CT scan of the chest to 04/13/2021 thoracic aorta 25 mm as measured it is greatest thoracic aortia 30 at this 6 mm at its greatest.  He will need to have a follow-up CT within the next year if not completed by primary care for ongoing surveillance.        Signed, Bettey Mare. Liborio Nixon, ANP, AACC   05/22/2023 4:46 PM      Office [phone] Fax [phone]  Notice: This dictation was prepared with Dragon dictation along with smaller phrase technology. Any transcriptional errors that result from this process are unintentional and may not be corrected upon review.

## 2023-05-22 ENCOUNTER — Encounter: Payer: Self-pay | Admitting: Adult Health

## 2023-05-22 ENCOUNTER — Ambulatory Visit: Payer: Medicare PPO | Attending: Adult Health | Admitting: Adult Health

## 2023-05-22 VITALS — BP 134/66 | HR 71 | Ht 72.0 in | Wt 389.0 lb

## 2023-05-22 DIAGNOSIS — Z9861 Coronary angioplasty status: Secondary | ICD-10-CM | POA: Diagnosis not present

## 2023-05-22 DIAGNOSIS — I2510 Atherosclerotic heart disease of native coronary artery without angina pectoris: Secondary | ICD-10-CM | POA: Diagnosis not present

## 2023-05-22 DIAGNOSIS — I152 Hypertension secondary to endocrine disorders: Secondary | ICD-10-CM

## 2023-05-22 DIAGNOSIS — E1159 Type 2 diabetes mellitus with other circulatory complications: Secondary | ICD-10-CM

## 2023-05-22 DIAGNOSIS — I5042 Chronic combined systolic (congestive) and diastolic (congestive) heart failure: Secondary | ICD-10-CM | POA: Diagnosis not present

## 2023-05-22 MED ORDER — METOPROLOL TARTRATE 50 MG PO TABS
50.0000 mg | ORAL_TABLET | Freq: Two times a day (BID) | ORAL | 3 refills | Status: DC
Start: 2023-05-22 — End: 2024-06-14

## 2023-05-22 MED ORDER — RIVAROXABAN 20 MG PO TABS: 20.0000 mg | ORAL_TABLET | Freq: Every day | ORAL | 11 refills | Status: DC

## 2023-05-22 NOTE — Patient Instructions (Addendum)
Medication Instructions:  NO CHANGES   Lab Work: NONE   Testing/Procedures: NONE   Follow-Up: At [facility], you and your health needs are our priority.  As part of our continuing mission to provide you with exceptional heart care, we have created designated Provider Care Teams.  These Care Teams include your primary Cardiologist (physician) and Advanced Practice Providers (APPs -  Physician Assistants and Nurse Practitioners) who all work together to provide you with the care you need, when you need it.   Your next appointment:   1 year(s)  Provider:   Bryan Lemma, MD

## 2023-06-19 ENCOUNTER — Telehealth: Payer: Self-pay | Admitting: Family Medicine

## 2023-06-19 ENCOUNTER — Other Ambulatory Visit: Payer: Self-pay | Admitting: Family Medicine

## 2023-06-19 NOTE — Telephone Encounter (Signed)
Prescription Request  06/19/2023  LOV: 02/02/2023  What is the name of the medication or equipment?  tizanidine (ZANAFLEX) 2 MG capsule    Have you contacted your pharmacy to request a refill? No   Which pharmacy would you like this sent to? [id number] [place], [place] - [address] [place] [address] Phone: [phone] Fax: [phone]    Patient notified that their request is being sent to the clinical staff for review and that they should receive a response within 2 business days.   Please advise at Mobile [phone] (mobile)

## 2023-06-22 ENCOUNTER — Other Ambulatory Visit: Payer: Self-pay

## 2023-06-22 MED ORDER — TIZANIDINE HCL 2 MG PO TABS: 2.0000 mg | ORAL_TABLET | Freq: Three times a day (TID) | ORAL | 1 refills | Status: DC | PRN

## 2023-06-22 NOTE — Telephone Encounter (Signed)
Refill sent to requested pharmacy.

## 2023-07-07 ENCOUNTER — Encounter: Payer: Self-pay | Admitting: Family Medicine

## 2023-07-13 ENCOUNTER — Encounter: Payer: Self-pay | Admitting: Family Medicine

## 2023-07-17 ENCOUNTER — Encounter: Payer: Self-pay | Admitting: Internal Medicine

## 2023-07-17 ENCOUNTER — Other Ambulatory Visit: Payer: Self-pay | Admitting: Internal Medicine

## 2023-07-17 ENCOUNTER — Ambulatory Visit
Admission: RE | Admit: 2023-07-17 | Discharge: 2023-07-17 | Disposition: A | Discharge status: Home / Self Care | Payer: Medicare PPO | Source: Ambulatory Visit | Attending: Internal Medicine | Admitting: Internal Medicine

## 2023-07-17 ENCOUNTER — Ambulatory Visit: Payer: Medicare PPO | Admitting: Internal Medicine

## 2023-07-17 VITALS — BP 134/77 | HR 80 | Temp 98.0°F | Ht 72.0 in

## 2023-07-17 DIAGNOSIS — R1012 Left upper quadrant pain: Secondary | ICD-10-CM | POA: Diagnosis not present

## 2023-07-17 DIAGNOSIS — M545 Low back pain, unspecified: Secondary | ICD-10-CM | POA: Diagnosis not present

## 2023-07-17 DIAGNOSIS — N1831 Chronic kidney disease, stage 3a: Secondary | ICD-10-CM | POA: Diagnosis not present

## 2023-07-17 DIAGNOSIS — R1904 Left lower quadrant abdominal swelling, mass and lump: Secondary | ICD-10-CM

## 2023-07-17 DIAGNOSIS — R638 Other symptoms and signs concerning food and fluid intake: Secondary | ICD-10-CM

## 2023-07-17 MED ORDER — GABAPENTIN 300 MG PO CAPS: 300.0000 mg | ORAL_CAPSULE | Freq: Three times a day (TID) | ORAL | 3 refills | Status: DC

## 2023-07-17 MED ORDER — PREDNISONE 20 MG PO TABS: ORAL_TABLET | ORAL | 0 refills | Status: DC

## 2023-07-17 MED ORDER — TIZANIDINE HCL 4 MG PO CAPS: 4.0000 mg | ORAL_CAPSULE | Freq: Three times a day (TID) | ORAL | 2 refills | Status: DC

## 2023-07-17 MED ORDER — TRAMADOL HCL 50 MG PO TABS: 50.0000 mg | ORAL_TABLET | Freq: Three times a day (TID) | ORAL | 0 refills | Status: AC | PRN
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
## 2023-07-17 NOTE — Assessment & Plan Note (Signed)
Chronic Kidney Disease Stage 3   With a GFR approximately 55%, indicating mild to moderate chronic kidney disease, the potential risks of contrast dye with CT imaging, including the risk of further kidney damage, were discussed. To minimize risk, contrast dye in CT imaging will be avoided, and kidney function will be monitored with regular blood work.

## 2023-07-17 NOTE — Patient Instructions (Addendum)
Investigate Zepbound  - Semaglutide from [employer] like [email or web address].  AFTER VISIT SUMMARY DIAGNOSTIC STUDIES:  CT Scan: Renal stone protocol (without contrast) Abdominal Ultrasound Lumbar X-rays with flexion and extension  FOLLOW-UP:  Primary Care Appointment within 2 weeks Physical Therapy Referral  NEW MEDICATIONS:  Gabapentin: Nerve pain Tramadol: General pain management Prednisone: Inflammatory pain Zanaflex: Muscle relaxant  BACK PAIN SAFETY GUIDELINES:  Avoid prolonged sitting Use walker for stability Gentle stretching Alternate heat/cold therapy Maintain proper posture Stop activities causing sharp pain Rest between activities  WARNING SIGNS - CONTACT HEALTHCARE PROVIDER:  Increasing pain Fever Numbness/tingling Bladder/bowel changes Severe weakness  PHYSICAL THERAPY FOCUS:  Gentle stretching Core strengthening Posture correction Pain management techniques  VISIT SUMMARY:  During today's visit, we discussed your persistent lower back pain, the knot-like sensation in your left abdominal area, and your chronic kidney disease. We also reviewed your weight management and general health maintenance.  YOUR PLAN:  -THORACIC RADICULOPATHY: Thoracic radiculopathy is a condition where a nerve in the middle part of your spine is pinched, causing pain. We will order a lumbar spine x-ray and a CT scan to investigate further. You are prescribed Gabapentin for nerve pain, Tizanidine capsules for muscle spasms, Prednisone for inflammation, and Tramadol for breakthrough pain. If physical therapy does not help, surgery may be considered.  -ABDOMINAL MASS: The knot-like sensation in your left abdominal area could be due to muscle spasms or another abdominal issue. We will order an abdominal ultrasound to evaluate this mass.  -CHRONIC KIDNEY DISEASE STAGE 3: Chronic kidney disease stage 3 means your kidneys are moderately damaged and not working as well as  they should. We will avoid using contrast dye in your CT imaging to protect your kidneys and will monitor your kidney function with regular blood work.  -OBESITY: Obesity can affect your overall health, including your spine. We discussed the potential benefits of weight loss and the use of Zepbound (super Ozempic) for appetite suppression. Physical therapy is recommended to help with weight loss and exercise.  -GENERAL HEALTH MAINTENANCE: We emphasized the importance of regular follow-up and monitoring of your chronic conditions. Please follow up with Doctor Durene Cal on August 01, 2023, and schedule your x-rays and CT scan as soon as possible. A follow-up with internal medicine may be needed before your appointment with Doctor [facility].  INSTRUCTIONS:  Please follow up with Doctor Durene Cal on August 01, 2023. Schedule your x-rays and CT scan as soon as possible. A follow-up with internal medicine may be needed before your appointment with Doctor [facility].  It was a pleasure seeing you today! Your health and satisfaction are our top priorities.  Glenetta Hew, MD  Your Providers PCP: Shelva Majestic, MD,  [phone]) Referring Provider: Shelva Majestic, MD,  [phone]) Care Team Provider: Casper Harrison. Quillian Quince, MD,  [phone]) Care Team Provider: Barron Alvine, MD,  [phone]) Care Team Provider: Ranelle Oyster, MD,  [phone]) Care Team Provider: Marykay Lex, MD,  [phone]) Care Team Provider: [facility],  [phone]) Care Team Provider: Marykay Lex, MD,  [phone])     NEXT STEPS: [x]  Early Intervention: Schedule sooner appointment, call our on-call services, or go to emergency room if there is any significant Increase in pain or discomfort New or worsening symptoms Sudden or severe changes in your health [x]  Flexible Follow-Up: We recommend a No follow-ups on file. for optimal routine care. This allows for progress  monitoring and treatment adjustments. [x]  Preventive Care: Schedule your annual preventive care  visit! It's typically covered by insurance and helps identify potential health issues early. [x]  Lab & X-ray Appointments: Incomplete tests scheduled today, or call to schedule. X-rays: [facility] (M-F, 8:30am-noon or 1pm-5pm). [x]  Medical Information Release: Sign a release form at front desk to obtain relevant medical information we don't have.  MAKING THE MOST OF OUR FOCUSED 20 MINUTE APPOINTMENTS: [x]   Clearly state your top concerns at the beginning of the visit to focus our discussion [x]   If you anticipate you will need more time, please inform the front desk during scheduling - we can book multiple appointments in the same week. [x]   If you have transportation problems- use our convenient video appointments or ask about transportation support. [x]   We can get down to business faster if you use MyChart to update information before the visit and submit non-urgent questions before your visit. Thank you for taking the time to provide details through MyChart.  Let our nurse know and she can import this information into your encounter documents.  Arrival and Wait Times: [x]   Arriving on time ensures that everyone receives prompt attention. [x]   Early morning (8a) and afternoon (1p) appointments tend to have shortest wait times. [x]   Unfortunately, we cannot delay appointments for late arrivals or hold slots during phone calls.  Getting Answers and Following Up [x]   Simple Questions & Concerns: For quick questions or basic follow-up after your visit, reach [place] at [phone] or MyChart messaging. [x]   Complex Concerns: If your concern is more complex, scheduling an appointment might be best. Discuss this with the staff to find the most suitable option. [x]   Lab & Imaging Results: We'll contact you directly if results are abnormal or you don't use MyChart. Most normal results will be  on MyChart within 2-3 business days, with a review message from Dr. Jon Billings. Haven't heard back in 2 weeks? Need results sooner? Contact us at [phone]. [x]   Referrals: Our referral coordinator will manage specialist referrals. The specialist's office should contact you within 2 weeks to schedule an appointment. Call us if you haven't heard from them after 2 weeks.  Staying Connected [x]   MyChart: Activate your MyChart for the fastest way to access results and message [place]. See the last page of this paperwork for instructions on how to activate.  Bring to Your Next Appointment [x]   Medications: Please bring all your medication bottles to your next appointment to ensure we have an accurate record of your prescriptions. [x]   Health Diaries: If you're monitoring any health conditions at home, keeping a diary of your readings can be very helpful for discussions at your next appointment.  Billing [x]   X-ray & Lab Orders: These are billed by separate companies. Contact the invoicing company directly for questions or concerns. [x]   Visit Charges: Discuss any billing inquiries with our administrative services team.  Your Satisfaction Matters [x]   Share Your Experience: We strive for your satisfaction! If you have any complaints, or preferably compliments, please let Dr. Jon Billings know directly or contact our Practice Administrators, Edwena Felty or [employer], by asking at the front desk.   Reviewing Your Records [x]   Review this early draft of your clinical encounter notes below and the final encounter summary tomorrow on MyChart after its been completed.  All orders placed so far are visible here: Lumbar pain -     DG Lumbar Spine Complete; Future -     CT RENAL STONE STUDY; Future -  DG Lumb Spine Flex&Ext Only; Future -     Ambulatory referral to Physical Therapy -     Gabapentin; Take 1 capsule (300 mg total) by mouth 3 (three) times daily.  Dispense: 90 capsule; Refill: 3 -      tiZANidine HCl; Take 1 capsule (4 mg total) by mouth 3 (three) times daily.  Dispense: 90 capsule; Refill: 2 -     predniSONE; Take 2 pills for 3 days, 1 pill for 4 days  Dispense: 10 tablet; Refill: 0 -     traMADol HCl; Take 1 tablet (50 mg total) by mouth every 8 (eight) hours as needed for up to 5 days.  Dispense: 15 tablet; Refill: 0  Left upper quadrant abdominal pain -     CT RENAL STONE STUDY; Future -     US Abdomen Limited; Future  Left lower quadrant abdominal mass -     US Abdomen Limited; Future  Chronic kidney disease, stage 3a (HCC)  Weight disorder        Patient to bring all imaging results to next appointment.

## 2023-07-20 ENCOUNTER — Inpatient Hospital Stay
Admission: RE | Admit: 2023-07-20 | Discharge: 2023-07-20 | Discharge status: Home / Self Care | Payer: Medicare PPO | Source: Ambulatory Visit | Attending: Internal Medicine

## 2023-07-20 DIAGNOSIS — R1904 Left lower quadrant abdominal swelling, mass and lump: Secondary | ICD-10-CM | POA: Diagnosis not present

## 2023-07-20 DIAGNOSIS — R1012 Left upper quadrant pain: Secondary | ICD-10-CM

## 2023-07-21 ENCOUNTER — Encounter: Payer: Self-pay | Admitting: Internal Medicine

## 2023-07-28 ENCOUNTER — Ambulatory Visit
Admission: RE | Admit: 2023-07-28 | Discharge: 2023-07-28 | Disposition: A | Discharge status: Home / Self Care | Payer: Medicare PPO | Source: Ambulatory Visit | Attending: Internal Medicine

## 2023-07-28 DIAGNOSIS — R109 Unspecified abdominal pain: Secondary | ICD-10-CM | POA: Diagnosis not present

## 2023-07-28 DIAGNOSIS — N200 Calculus of kidney: Secondary | ICD-10-CM | POA: Diagnosis not present

## 2023-07-28 DIAGNOSIS — M545 Low back pain, unspecified: Secondary | ICD-10-CM

## 2023-07-28 DIAGNOSIS — R1012 Left upper quadrant pain: Secondary | ICD-10-CM

## 2023-07-28 DIAGNOSIS — N400 Enlarged prostate without lower urinary tract symptoms: Secondary | ICD-10-CM | POA: Diagnosis not present

## 2023-07-28 DIAGNOSIS — K8020 Calculus of gallbladder without cholecystitis without obstruction: Secondary | ICD-10-CM | POA: Diagnosis not present

## 2023-07-30 ENCOUNTER — Ambulatory Visit: Payer: Medicare PPO | Admitting: Family Medicine

## 2023-07-30 ENCOUNTER — Encounter: Payer: Self-pay | Admitting: Family Medicine

## 2023-07-30 VITALS — BP 128/72 | HR 83 | Temp 98.7°F | Ht 72.0 in

## 2023-07-30 DIAGNOSIS — Z6841 Body Mass Index (BMI) 40.0 and over, adult: Secondary | ICD-10-CM

## 2023-07-30 DIAGNOSIS — Z23 Encounter for immunization: Secondary | ICD-10-CM

## 2023-07-30 DIAGNOSIS — E119 Type 2 diabetes mellitus without complications: Secondary | ICD-10-CM | POA: Diagnosis not present

## 2023-07-30 DIAGNOSIS — Z7984 Long term (current) use of oral hypoglycemic drugs: Secondary | ICD-10-CM

## 2023-07-30 DIAGNOSIS — M545 Low back pain, unspecified: Secondary | ICD-10-CM

## 2023-07-30 NOTE — Progress Notes (Signed)
Phone [phone] In person visit   Subjective:   Franklin Hicks is a 72 y.o. year old very pleasant male patient who presents for/with See problem oriented charting Chief Complaint  Patient presents with   Medical Management of Chronic Issues   Diabetes   Back Pain    Past Medical History-  Patient Active Problem List   Diagnosis Date Noted   Recurrent pulmonary embolism (HCC)     Priority: High   C4 spinal cord injury, sequela (HCC) 12/18/2017    Priority: High   Tetraplegia (HCC) 12/18/2017    Priority: High   Aortic aneurysm (HCC) 12/16/2017    Priority: High   Cord compression (HCC) 12/13/2017    Priority: High   Chronic combined systolic and diastolic heart failure (HCC) 10/09/2016    Priority: High   CAD S/P percutaneous coronary angioplasty 09/20/2016    Priority: High   Morbid obesity (HCC) 07/18/2013    Priority: High   Diabetes mellitus type 2, controlled (HCC) 07/17/2010    Priority: High   Bilateral lower extremity edema 12/08/2008    Priority: High   Chronic kidney disease, stage 3a (HCC) 07/17/2023    Priority: Medium    Angioedema of lips 07/13/2013    Priority: Medium    Hyperlipidemia associated with type 2 diabetes mellitus (HCC) 10/02/2011    Priority: Medium    BPH with urinary obstruction 07/17/2010    Priority: Medium    Gout 03/15/2007    Priority: Medium    Essential hypertension 03/15/2007    Priority: Medium    Pain of left heel     Priority: Low   Primary osteoarthritis of knees, bilateral 01/11/2016    Priority: Low   Erectile dysfunction 07/30/2015    Priority: Low   Multinodular goiter 07/13/2013    Priority: Low   SOB (shortness of breath) 10/02/2011    Priority: Low   NEUROPATHY, IDIOPATHIC PERIPHERAL NEC 05/31/2007    Priority: Low   Osteoarthritis 03/22/2007    Priority: Low   Preoperative cardiovascular examination 02/05/2021   Edema 12/08/2008    Medications- reviewed and updated Current Outpatient Medications   Medication Sig Dispense Refill   acetaminophen (TYLENOL) 650 MG CR tablet Take 650-1,300 mg by mouth every 8 (eight) hours as needed for pain.     ammonium lactate (AMLACTIN) 12 % lotion Apply 1 application topically as needed for dry skin. 400 g 0   amoxicillin (AMOXIL) 875 MG tablet Take 875 mg by mouth 2 (two) times daily.     aspirin EC 81 MG tablet Take 1 tablet (81 mg total) by mouth daily. (Patient taking differently: Take 81 mg by mouth in the morning.) 90 tablet 3   atorvastatin (LIPITOR) 80 MG tablet TAKE 1 TABLET BY MOUTH DAILY AT 6 PM. 90 tablet 3   baclofen (LIORESAL) 20 MG tablet Take 1 tablet (20 mg total) by mouth 3 (three) times daily as needed for muscle spasms. 90 tablet 1   Chromium Picolinate (CHROMIUM PICOLATE PO) Take 1 tablet by mouth 3 (three) times a week.     clotrimazole-betamethasone (LOTRISONE) cream Apply to both lower extremities twice daily. 45 g 3   diclofenac sodium (VOLTAREN) 1 % GEL Apply 1 application topically 3 (three) times daily. Both knees 3 Tube 4   fluticasone (FLONASE) 50 MCG/ACT nasal spray SPRAY 2 SPRAYS INTO EACH NOSTRIL EVERY DAY 48 mL 1   metoprolol tartrate (LOPRESSOR) 50 MG tablet Take 1 tablet (50 mg total) by mouth 2 (  two) times daily. 180 tablet 3   Multiple Vitamin (MULTIVITAMIN WITH MINERALS) TABS tablet Take 1 tablet by mouth daily.     Potassium Citrate 15 MEQ (1620 MG) TBCR Take 1 tablet by mouth 2 (two) times daily.  11   rivaroxaban (XARELTO) 20 MG TABS tablet Take 1 tablet (20 mg total) by mouth daily. 30 tablet 11   terazosin (HYTRIN) 10 MG capsule TAKE 1 CAPSULE BY MOUTH EVERYDAY AT BEDTIME 90 capsule 3   vitamin C (ASCORBIC ACID) 500 MG tablet Take 500 mg by mouth daily in the afternoon.     No current facility-administered medications for this visit.     Objective:  BP 128/72   Pulse 83   Temp 98.7 F (37.1 C)   Ht 6' (1.829 m)   SpO2 95%   BMI 52.76 kg/m  Gen: NAD, resting comfortably CV: RRR no murmurs rubs or  gallops Lungs: CTAB no crackles, wheeze, rhonchi Ext: no edema Skin: warm, dry Neuro: hunched over in wheelchair due to back pain issues-very tender to palpation in both left and right low back but sparing the midline    Assessment and Plan   # Chronic low back pain/lumbar pain but with recent flare S: Patient complained of ongoing low back pain left worse than right - Per last note left paraspinal muscle area worsened in the last month-saw Dr. Jon Billings on 07/17/2023-lumbar spine x-rays and CT scan of renal system to look for stones (results still pending)-was given tramadol for breakthrough pain, tizanidine for muscle spasms (had to change to baclofen), prednisone for inflammation -Had left lower quadrant abdominal fullness sensation on left side  present for a month as well and ultrasound of the area was reassuring- he reports they looked over that lest side.  He has yet to complete the lumbar spine films- was unable to get on table for x-rays -gabapentin made him feel too dizzy so didn't take that - tramadol was worried about taking with baclofen -got call from physical therapy but pending -heat helped some at first A/P: Patient with intermittent flares of back pain over the last year at least.  He has been concerned in the past that this could be related to UTIs but typically not related.  Suspect underlying degenerative disc disease.  He attempted to go to [place] x-ray for lumbar spine films but they did not have enough support to get him onto the table unfortunately -Baclofen helping some-plans to continue - Felt too sedated when he also took a full tramadol-advised him to trial half - Felt too dizzy on gabapentin-added to allergy list -Very tender in the paraspinous muscles.  Also reports intermittent spasms into the abdomen.  Still pending results from CT renal study-on my view appears to have at least 1 stone but doubt this is causing his bilateral pain -He is going to trial physical  therapy but if he fails to improve or if he changes his mind later and simply wants to see orthopedics I am happy to place a referral to [facility] has seen Dr. Farris Has in the past -Gave information on TENS unit to trial   # Diabetes/morbid obesity.  S: medication(s): Wants to work on diet and exercise control-in 2024 has jardiance 10 mg prescribed but not taking-removing from list As far as obesity-largely stable with weight around 390-stable through most of the year Lab Results  Component Value Date   HGBA1C 7.5 (H) 02/02/2023   HGBA1C 7.7 (H) 10/07/2022   HGBA1C 8.3 (H)  07/01/2022  A/P: Diabetes likely mild poorly controlled-update A1c today-we took Jardiance off his list since he has never taken this.  After prolonged discussion he is open to Ozempic but we want to make sure his back is doing better before we start this  For morbid obesity-discussed role of Ozempic and helping with weight loss which I think could help him in multiple areas  Recommended follow up: Return in about 4 months (around 11/28/2023) for followup or sooner if needed.Schedule b4 you leave. Future Appointments  Date Time Provider Department Center  08/11/2023  2:45 PM Freddie Breech, [place] [facility]  09/16/2023  2:20 PM [facility] ANNUAL WELLNESS VISIT 1 [facility]  10/29/2023  2:30 PM Deirdre Evener, MD [facility] None    Lab/Order associations:   ICD-10-CM   1. Controlled type 2 diabetes mellitus without complication, without long-term current use of insulin (HCC)  E11.9 Hemoglobin A1c    Urine Microalbumin w/creat. ratio    Comprehensive metabolic panel    2. Lumbar pain  M54.50     3. Morbid obesity (HCC)  E66.01     4. Need for influenza vaccination  Z23 Flu Vaccine Trivalent High Dose (Fluad)      Time Spent: 35 minutes of total time (2:50 PM- 3:25 PM) was spent on the date of the encounter performing the following actions: chart review prior to seeing the patient, obtaining history,  performing a medically necessary exam, counseling on the treatment plan as well as options including TENS unit and physical therapy or orthopedics referral for back pain and discussing Ozempic versus Mounjaro versus other weight loss agents for obesity and diabetes, placing orders, and documenting in our EHR.    Return precautions advised.  Tana Conch, MD

## 2023-07-30 NOTE — Patient Instructions (Addendum)
Trial physical therapy- if not improving in a few sessions let me know and we can refer to Dr. Farris Has [facility]  Please stop by lab before you go If you have mychart- we will send your results within 3 business days of [place] receiving them.  If you do not have mychart- we will call you about results within 5 business days of [place] receiving them.  *please also note that you will see labs on mychart as soon as they post. I will later go in and write notes on them- will say "notes from Dr. Durene Cal"   Once your back is better we agreed to trial Ozempic as next step would be 0.25 mg for 4 weeks then 0.5 mg ongoing- really good videos on their website on how to inject  Recommended follow up: Return in about 4 months (around 11/28/2023) for followup or sooner if needed.Schedule b4 you leave.  TENS UNIT: This is helpful for muscle pain and spasm.   Search and Purchase a TENS 7000 2nd edition at  www.tenspros.com or www.Amazon.com It should be less than $30.     TENS unit instructions: Do not shower or bathe with the unit on Turn the unit off before removing electrodes or batteries If the electrodes lose stickiness add a drop of water to the electrodes after they are disconnected from the unit and place on plastic sheet. If you continued to have difficulty, call the TENS unit company to purchase more electrodes. Do not apply lotion on the skin area prior to use. Make sure the skin is clean and dry as this will help prolong the life of the electrodes. After use, always check skin for unusual red areas, rash or other skin difficulties. If there are any skin problems, does not apply electrodes to the same area. Never remove the electrodes from the unit by pulling the wires. Do not use the TENS unit or electrodes other than as directed. Do not change electrode placement without consultating your therapist or physician. Keep 2 fingers with between each electrode. Wear time ratio is 2:1, on to off  times.    For example on for 30 minutes off for 15 minutes and then on for 30 minutes off for 15 minutes

## 2023-07-31 LAB — COMPREHENSIVE METABOLIC PANEL
ALT: 25 U/L (ref 0–53)
AST: 24 U/L (ref 0–37)
Albumin: 3.6 g/dL (ref 3.5–5.2)
Alkaline Phosphatase: 98 U/L (ref 39–117)
BUN: 19 mg/dL (ref 6–23)
CO2: 27 meq/L (ref 19–32)
Calcium: 8.7 mg/dL (ref 8.4–10.5)
Chloride: 103 meq/L (ref 96–112)
Creatinine, Ser: 1.15 mg/dL (ref 0.40–1.50)
GFR: 62.88 mL/min (ref >60.00)
Glucose, Bld: 114 mg/dL — ABNORMAL HIGH (ref 70–99)
Potassium: 4.4 meq/L (ref 3.5–5.1)
Sodium: 139 meq/L (ref 135–145)
Total Bilirubin: 0.5 mg/dL (ref 0.2–1.2)
Total Protein: 7 g/dL (ref 6.0–8.3)

## 2023-07-31 LAB — HEMOGLOBIN A1C: Hgb A1c MFr Bld: 7.4 % — ABNORMAL HIGH (ref 4.6–6.5)

## 2023-07-31 LAB — MICROALBUMIN / CREATININE URINE RATIO
Creatinine,U: 190.5 mg/dL
Microalb Creat Ratio: 0.4 mg/g (ref 0.0–30.0)
Microalb, Ur: <0.7 mg/dL (ref 0.0–1.9)

## 2023-08-04 ENCOUNTER — Encounter: Payer: Self-pay | Admitting: Family Medicine

## 2023-08-04 MED ORDER — TIZANIDINE HCL 2 MG PO CAPS: 2.0000 mg | ORAL_CAPSULE | Freq: Three times a day (TID) | ORAL | 5 refills | Status: DC | PRN

## 2023-08-09 ENCOUNTER — Encounter: Payer: Self-pay | Admitting: Internal Medicine

## 2023-08-09 DIAGNOSIS — N400 Enlarged prostate without lower urinary tract symptoms: Secondary | ICD-10-CM | POA: Insufficient documentation

## 2023-08-09 DIAGNOSIS — K760 Fatty (change of) liver, not elsewhere classified: Secondary | ICD-10-CM | POA: Insufficient documentation

## 2023-08-09 DIAGNOSIS — I739 Peripheral vascular disease, unspecified: Secondary | ICD-10-CM | POA: Insufficient documentation

## 2023-08-09 DIAGNOSIS — M51369 Other intervertebral disc degeneration, lumbar region without mention of lumbar back pain or lower extremity pain: Secondary | ICD-10-CM | POA: Insufficient documentation

## 2023-08-09 DIAGNOSIS — K8020 Calculus of gallbladder without cholecystitis without obstruction: Secondary | ICD-10-CM | POA: Insufficient documentation

## 2023-08-11 ENCOUNTER — Ambulatory Visit (INDEPENDENT_AMBULATORY_CARE_PROVIDER_SITE_OTHER): Payer: Medicare PPO | Admitting: Podiatry

## 2023-08-11 ENCOUNTER — Encounter: Payer: Self-pay | Admitting: Podiatry

## 2023-08-11 DIAGNOSIS — B351 Tinea unguium: Secondary | ICD-10-CM | POA: Diagnosis not present

## 2023-08-11 DIAGNOSIS — M79674 Pain in right toe(s): Secondary | ICD-10-CM

## 2023-08-11 DIAGNOSIS — E1142 Type 2 diabetes mellitus with diabetic polyneuropathy: Secondary | ICD-10-CM

## 2023-08-11 DIAGNOSIS — M79675 Pain in left toe(s): Secondary | ICD-10-CM | POA: Diagnosis not present

## 2023-08-12 NOTE — Telephone Encounter (Signed)
Patient's review of lab results/notes confirmed.

## 2023-08-12 NOTE — Therapy (Unsigned)
OUTPATIENT PHYSICAL THERAPY THORACOLUMBAR EVALUATION  Patient Name: SHOWN MELROSE MRN: [id number] DOB:[birth year], 72 y.o., male Today's Date: 08/13/2023   PT End of Session - 08/13/23 1553     Visit Number 1    Date for PT Re-Evaluation 10/08/23    Authorization Type [employer] MCR - FOTO    Progress Note Due on Visit 10    PT Start Time 1215    PT Stop Time 1300    PT Time Calculation (min) 45 min             Past Medical History:  Diagnosis Date   Arthritis    "knees" (12/17/2017)   CAD S/P percutaneous coronary angioplasty 09/20/2016   Nstemi 08/2016. Dr. Algie Coffer. DES- brilinta and asa. Requests change to [facility] cardiology; 95% Ramus -> PCI Resolute Onyx DES 2.75 x 18   Central cord syndrome (HCC) 12/17/2017   Chronic combined systolic and diastolic heart failure (HCC) 10/09/2016   EF 45% and grade II diastolic after nstemi   Diet-controlled diabetes mellitus (HCC)    DJD (degenerative joint disease)    Gout    High cholesterol    Hypertension    NSTEMI (non-ST elevated myocardial infarction) (HCC) 09/20/2016   95% Ramus - > PCI    Obesity    Recurrent pulmonary embolism (HCC) 11/'14; 4/'19   a) Bilateral segmental and subsegmental pulmonary emboli with mild RV Strain.; b) after fall with C-spine Fxr --> Acute PE of right main pulmonary artery extending into multiple segments.   Past Surgical History:  Procedure Laterality Date   CARDIAC CATHETERIZATION N/A 09/19/2016   Procedure: Left Heart Cath and Coronary Angiography;  Surgeon: Orpah Cobb, MD;  Location: [facility] INVASIVE CV LAB;  Service: Cardiovascular: 95% proximal Ramus Intermedius --> PCI   CARDIAC CATHETERIZATION N/A 09/19/2016   Procedure: Coronary Stent Intervention;  Surgeon: Yvonne Kendall, MD;  Location: [facility] INVASIVE CV LAB;  Service: Cardiovascular: 95% ramus intermedius;  Resolute Onyx 2.75 x 18 mm drug-eluting stent   CYSTOSCOPY/RETROGRADE/URETEROSCOPY/STONE EXTRACTION WITH BASKET  7253,6644   ureteral stone     MENISCUS REPAIR Left    knee open meniscetomy   TRANSTHORACIC ECHOCARDIOGRAM  2004   no lvh nl ejection fraction   TRANSTHORACIC ECHOCARDIOGRAM  09/19/2016   In setting of NSTEMI:  EF 45-50% with diffuse hypokinesis. GR 2 DD. Mild biatrial enlargement.   Patient Active Problem List   Diagnosis Date Noted   Cholelithiases 08/09/2023   Fatty liver 08/09/2023   Peripheral arterial disease (HCC) 08/09/2023   Prostatic enlargement 08/09/2023   DDD (degenerative disc disease), lumbar 08/09/2023   Chronic kidney disease, stage 3a (HCC) 07/17/2023   Preoperative cardiovascular examination 02/05/2021   Recurrent pulmonary embolism (HCC)    Pain of left heel    C4 spinal cord injury, sequela (HCC) 12/18/2017   Tetraplegia (HCC) 12/18/2017   Aortic aneurysm (HCC) 12/16/2017   Cord compression (HCC) 12/13/2017   Chronic combined systolic and diastolic heart failure (HCC) 10/09/2016   CAD S/P percutaneous coronary angioplasty 09/20/2016   Primary osteoarthritis of knees, bilateral 01/11/2016   Erectile dysfunction 07/30/2015   Morbid obesity (HCC) 07/18/2013   Angioedema of lips 07/13/2013   Multinodular goiter 07/13/2013   Hyperlipidemia associated with type 2 diabetes mellitus (HCC) 10/02/2011   SOB (shortness of breath) 10/02/2011   BPH with urinary obstruction 07/17/2010   Diabetes mellitus type 2, controlled (HCC) 07/17/2010   Bilateral lower extremity edema 12/08/2008   Edema 12/08/2008   NEUROPATHY, IDIOPATHIC PERIPHERAL NEC 05/31/2007  Osteoarthritis 03/22/2007   Gout 03/15/2007   Essential hypertension 03/15/2007    PCP: Shelva Majestic, MD  REFERRING PROVIDER: Lula Olszewski, MD  THERAPY DIAG:  Other low back pain  Muscle weakness (generalized)  Other abnormalities of gait and mobility  REFERRING DIAG: Lumbar pain [M54.50]   Rationale for Evaluation and Treatment:  Rehabilitation  SUBJECTIVE:  PERTINENT PAST HISTORY:  Bilateral LE edema, DM, CAD with  NSTEMI 2018, aortic aneurysm, pulmonary embolism, gout, peripheral neuropathy, SOB, C4 spinal cord injury (states he is unsure if there are still sxs from this), PAD, CHF,       PRECAUTIONS: Fall, NEEDS UPPER BODY ELEVATED IN RECUMBENT POSITIONS  WEIGHT BEARING RESTRICTIONS No  FALLS:  Has patient fallen in last 6 months? No, Number of falls: 0  MOI/History of condition:  Onset date: several weeks  SUBJECTIVE STATEMENT  AMEDIO MARTINES is a 72 y.o. male who presents to clinic with chief complaint of low back and abdominal pain.  Per pt he has had a CT scan and [place] which ruled out major pathology but did show muscle knots.  His abdominal sxs began with no clear cause.  "I do a lot of sitting".  He feels that the pain radiates from his back to his abdomen.  He reports he sleeps in a bed with head elevated and after waking up sits in his w/c except when going to the bathroom.    The pain does not bother him at night.  He uses a FWW for mobility at home.  He does drive.  He reports minimal relief with muscle relaxer's.  He was walking a bit more often, including walking up the w/c ramp at his house.  He stopped this d/t foot pain.  His abdominal pain began not long after he reduced his volume of walking.   Pain:  Are you having pain? Yes Pain location: abdomen and low back NPRS scale:  3/10 to 10/10 Aggravating factors: sitting up straight Relieving factors: bending forward Pain description:  spasm, aching, tight Stage: Subacute 24 hour pattern: intense throughout the day   Occupation: NA  Assistive Device: FWW for ambulation at home  Hand Dominance: NA  Patient Goals/Specific Activities: reduce pain   OBJECTIVE:   DIAGNOSTIC FINDINGS:  PROCEDURE: ULTRASOUND ABDOMEN LIMITED   HISTORY: Patient is a 72 y/o  M with abdomen mass.   COMPARISON:   TECHNIQUE: Two-dimensional grayscale and color Doppler ultrasound of the of the left flank region of interest was performed.    FINDINGS: No distinct masses are identified within the left flank, area of concern. No focal fluid collections are demonstrated. No sonographic abnormalities are detected.   IMPRESSION: 1. Unremarkable ultrasound of the left flank. No sonographic evidence of mass lesion within the area of concern.   Thank you for allowing [place] to assist in the care of this patient.  GENERAL OBSERVATION/GAIT: In manual w/c, L knee flexion contracture, significant swelling bil LE   LUMBAR AROM  Unable to test  LE MMT:  MMT Right (Eval) Left (Eval)  Hip flexion (L2, L3) 4 4  Knee extension (L3) 4 4 in available range  Knee flexion 4 in available range 4 in available range  Hip abduction    Hip extension    Hip external rotation    Hip internal rotation    Hip adduction    Ankle dorsiflexion (L4)    Ankle plantarflexion (S1)    Ankle inversion    Ankle eversion  Great Toe ext (L5)    Grossly     (Blank rows = not tested, score listed is out of 5 possible points.  N = WNL, D = diminished, C = clear for gross weakness with myotome testing, * = concordant pain with testing)  SPECIAL TESTS:  NT  MUSCLE LENGTH: NT  LE ROM:  ROM Right (Eval) Left (Eval)  Hip flexion    Hip extension    Hip abduction    Hip adduction    Hip internal rotation    Hip external rotation    Knee flexion    Knee extension    Ankle dorsiflexion    Ankle plantarflexion    Ankle inversion    Ankle eversion      (Blank rows = not tested, N = WNL, * = concordant pain with testing)  Functional Tests  Eval                                                                PALPATION:   Significant TTP LLQ of abdomen  PATIENT SURVEYS:  FOTO 52 -> 67  TODAY'S TREATMENT  None today  PATIENT EDUCATION:  POC, diagnosis, prognosis, and outcome measures.  Pt educated via explanation,.  Pt confirms understanding verbally.   HOME EXERCISE PROGRAM: None provided today.   Treatment  priorities   Eval                                                  ASSESSMENT:  CLINICAL IMPRESSION: Tzion is a 72 y.o. male who presents to clinic with signs and sxs consistent with low back pain with concurrent abdominal pain and spasm.  He does have a tender palpable mass in his the LLQ of his abdomen. He states he had an ultrasound on this area which was (-), although the ultrasound listed states it was for L flank pain.  Pt has complex medical history, limited mobility, and significant pain levels which limits objective exam.  Given that he is in a forward flexed trunk posture the vast majority of the day, both in sleeping or sitting, I am hopeful that a combination of increased time in more neutral spine postures (such as sanding) and core strengthening will improve his sxs.  Abrahan will benefit from skilled therapy to address relevant deficits and improve is safety and independence with transfers and short bouts of ambulation to improve his independence and QOL.  OBJECTIVE IMPAIRMENTS: Pain, LE and core strength, gait, balance  ACTIVITY LIMITATIONS: transfers, walking, standing, toileting  PERSONAL FACTORS: See medical history and pertinent history   REHAB POTENTIAL: Fair severe pain with multiple co morbidities   CLINICAL DECISION MAKING: Stable/uncomplicated  EVALUATION COMPLEXITY: Low   GOALS:   SHORT TERM GOALS: Target date: 09/10/2023   Natthan will be >75% HEP compliant to improve carryover between sessions and facilitate independent management of condition  Evaluation: ongoing Goal status: INITIAL   LONG TERM GOALS: Target date: 10/08/2023   Summer will improve FOTO score to 67 as a proxy for functional improvement  Evaluation/Baseline: 52 Goal status: INITIAL    2.  Jaguar will self report >/=  50% decrease in pain from evaluation to improve function in daily tasks  Evaluation/Baseline: 10/10 max pain Goal status: INITIAL   3.  Nicholaos will be able to navigate  the ramp to enter his house, not limited by pain  Evaluation/Baseline: limited Goal status: INITIAL  4.  Novian will be able to walk to and from the bathroom with minimal increase in abdominal pain  Evaluation/Baseline: limited Goal status: INITIAL   5.  Shellie will report confidence in self management of condition at time of discharge with advanced HEP  Evaluation/Baseline: unable to self manage Goal status: INITIAL  6.  Serjio will be able to sit for >120 min, not limited by pain   Evaluation/Baseline: significant pain Goal status: INITIAL     PLAN: PT FREQUENCY: 1-2x/week  PT DURATION: 8 weeks  PLANNED INTERVENTIONS:  [id number]- PT Re-evaluation, 97110-Therapeutic exercises, 97530- Therapeutic activity, 97112- Neuromuscular re-education, 97535- Self Care, 44034- Manual therapy, [id number]- Gait training, [id number]- Aquatic Therapy, [phone]- Electrical stimulation (manual), [id number]- Vasopneumatic device, [id number]- Traction (mechanical), [id number]- Ionotophoresis 4mg /ml Dexamethasone, Taping, Dry Needling, Joint manipulation, and Spinal manipulation.   Alphonzo Severance PT, DPT 08/13/2023, 3:54 PM

## 2023-08-13 ENCOUNTER — Encounter: Payer: Self-pay | Admitting: Physical Therapy

## 2023-08-13 ENCOUNTER — Ambulatory Visit: Payer: Medicare PPO | Attending: Internal Medicine | Admitting: Physical Therapy

## 2023-08-13 ENCOUNTER — Other Ambulatory Visit: Payer: Self-pay

## 2023-08-13 DIAGNOSIS — R2689 Other abnormalities of gait and mobility: Secondary | ICD-10-CM | POA: Diagnosis not present

## 2023-08-13 DIAGNOSIS — M6281 Muscle weakness (generalized): Secondary | ICD-10-CM | POA: Insufficient documentation

## 2023-08-13 DIAGNOSIS — M545 Low back pain, unspecified: Secondary | ICD-10-CM | POA: Diagnosis not present

## 2023-08-13 DIAGNOSIS — M5459 Other low back pain: Secondary | ICD-10-CM | POA: Insufficient documentation

## 2023-08-16 NOTE — Progress Notes (Signed)
Subjective:  Patient ID: Franklin Hicks, male    DOB: [birth year],  MRN: [id number]  72 y.o. male presents to clinic with  at risk foot care with history of diabetic neuropathy and painful elongated mycotic toenails 1-5 bilaterally which are tender when wearing enclosed shoe gear. Pain is relieved with periodic professional debridement. Patient has been to Dermatology for bilateral LE. States he was prescribed cream and ointment. He has not applied them due to back problems. He is awaiting therapy for his back. Chief Complaint  Patient presents with   Diabetes    Patient states last seen his pcp a few weeks ago on dcemember 5th, patient A1c was 7.4    New problem(s): None   PCP is Shelva Majestic, MD.  Allergies  Allergen Reactions   Ace Inhibitors     Angioedema   Gabapentin     Dizzy on 300 mg dose    Other Hives and Itching    msg    Review of Systems: Negative except as noted in the HPI.   Objective:  RANVEER KWIATKOWSKI is a pleasant 72 y.o. male morbidly obese in NAD.Marland Kitchen AAO x 3.  Vascular Examination: Capillary refill time immediate b/l. Vascular status intact b/l with palpable pedal pulses. Pedal hair absent b/l. Chronic venous insufficiency noted b/l LE with associated skin changes noted. No pain with calf compression b/l. Skin temperature gradient WNL b/l. No ischemia or gangrene noted b/l LE. No cyanosis or clubbing noted b/l LE.  Neurological Examination: Sensation grossly intact b/l with 10 gram monofilament. Vibratory sensation intact b/l. Pt has subjective symptoms of neuropathy.  Dermatological Examination: No open wounds. No interdigital macerations.   Toenails 1-5 b/l thick, discolored, elongated with subungual debris and pain on dorsal palpation.   Severe hyperkeratosis noted b/l heels with fissuring/deep cracks. No bleeding noted. He also has hyperpigmented plaques bilateral lower extremities extending from below knee to both ankles .  Musculoskeletal  Examination: Muscle strength 3/5 to all lower extremity muscle groups bilaterally. Pes planus deformity noted bilateral LE. Utilizes wheelchair for mobility assistance.  Radiographs: None  Last A1c:      Latest Ref Rng & Units 07/30/2023    3:24 PM 02/02/2023    3:21 PM 10/07/2022    3:49 PM  Hemoglobin A1C  Hemoglobin-A1c 4.6 - 6.5 % 7.4  7.5  7.7      Assessment:   1. Pain due to onychomycosis of toenails of both feet   2. Diabetic peripheral neuropathy associated with type 2 diabetes mellitus (HCC)    Plan:  -Consent given for treatment as described below: -Examined patient. -Informed patient about long handled sponge available to assist in applying topical medication to legs/feet. Patient states he will get the product. -Patient to continue soft, supportive shoe gear daily. -Toenails 1-5 b/l were debrided in length and girth with sterile nail nippers without iatrogenic bleeding.  -Patient/POA to call should there be question/concern in the interim.  Return in about 3 months (around 11/09/2023).  Freddie Breech, DPM      [place] LOCATION: [address], [place] [address]                   Office (  [phone]   [facility] LOCATION: [address] [place], [place] [address] Office [phone]

## 2023-08-20 ENCOUNTER — Ambulatory Visit: Payer: Medicare PPO | Admitting: Physical Therapy

## 2023-08-20 ENCOUNTER — Encounter: Payer: Self-pay | Admitting: Physical Therapy

## 2023-08-20 DIAGNOSIS — M5459 Other low back pain: Secondary | ICD-10-CM

## 2023-08-20 DIAGNOSIS — R2689 Other abnormalities of gait and mobility: Secondary | ICD-10-CM | POA: Diagnosis not present

## 2023-08-20 DIAGNOSIS — M545 Low back pain, unspecified: Secondary | ICD-10-CM | POA: Diagnosis not present

## 2023-08-20 DIAGNOSIS — M6281 Muscle weakness (generalized): Secondary | ICD-10-CM

## 2023-08-20 NOTE — Therapy (Addendum)
OUTPATIENT PHYSICAL THERAPY THORACOLUMBAR EVALUATION  Patient Name: OAKLYN BUSKO MRN: [id number] DOB:1951-07-18, 72 y.o., male Today's Date: 08/20/2023   PT End of Session - 08/20/23 1212     Visit Number 2    Number of Visits 10    Date for PT Re-Evaluation 10/08/23    Authorization Type [employer] MCR - FOTO    PT Start Time 1215    PT Stop Time 1300    PT Time Calculation (min) 45 min    Activity Tolerance Patient tolerated treatment well;Patient limited by fatigue    Behavior During Therapy [facility] for tasks assessed/performed              Past Medical History:  Diagnosis Date   Arthritis    "knees" (12/17/2017)   CAD S/P percutaneous coronary angioplasty 09/20/2016   Nstemi 08/2016. Dr. Algie Coffer. DES- brilinta and asa. Requests change to [facility] cardiology; 95% Ramus -> PCI Resolute Onyx DES 2.75 x 18   Central cord syndrome (HCC) 12/17/2017   Chronic combined systolic and diastolic heart failure (HCC) 10/09/2016   EF 45% and grade II diastolic after nstemi   Diet-controlled diabetes mellitus (HCC)    DJD (degenerative joint disease)    Gout    High cholesterol    Hypertension    NSTEMI (non-ST elevated myocardial infarction) (HCC) 09/20/2016   95% Ramus - > PCI    Obesity    Recurrent pulmonary embolism (HCC) 11/'14; 4/'19   a) Bilateral segmental and subsegmental pulmonary emboli with mild RV Strain.; b) after fall with C-spine Fxr --> Acute PE of right main pulmonary artery extending into multiple segments.   Past Surgical History:  Procedure Laterality Date   CARDIAC CATHETERIZATION N/A 09/19/2016   Procedure: Left Heart Cath and Coronary Angiography;  Surgeon: Orpah Cobb, MD;  Location: [facility] INVASIVE CV LAB;  Service: Cardiovascular: 95% proximal Ramus Intermedius --> PCI   CARDIAC CATHETERIZATION N/A 09/19/2016   Procedure: Coronary Stent Intervention;  Surgeon: Yvonne Kendall, MD;  Location: [facility] INVASIVE CV LAB;  Service: Cardiovascular: 95% ramus intermedius;  Resolute  Onyx 2.75 x 18 mm drug-eluting stent   CYSTOSCOPY/RETROGRADE/URETEROSCOPY/STONE EXTRACTION WITH BASKET  6962,9528   ureteral stone    MENISCUS REPAIR Left    knee open meniscetomy   TRANSTHORACIC ECHOCARDIOGRAM  2004   no lvh nl ejection fraction   TRANSTHORACIC ECHOCARDIOGRAM  09/19/2016   In setting of NSTEMI:  EF 45-50% with diffuse hypokinesis. GR 2 DD. Mild biatrial enlargement.   Patient Active Problem List   Diagnosis Date Noted   Cholelithiases 08/09/2023   Fatty liver 08/09/2023   Peripheral arterial disease (HCC) 08/09/2023   Prostatic enlargement 08/09/2023   DDD (degenerative disc disease), lumbar 08/09/2023   Chronic kidney disease, stage 3a (HCC) 07/17/2023   Preoperative cardiovascular examination 02/05/2021   Recurrent pulmonary embolism (HCC)    Pain of left heel    C4 spinal cord injury, sequela (HCC) 12/18/2017   Tetraplegia (HCC) 12/18/2017   Aortic aneurysm (HCC) 12/16/2017   Cord compression (HCC) 12/13/2017   Chronic combined systolic and diastolic heart failure (HCC) 10/09/2016   CAD S/P percutaneous coronary angioplasty 09/20/2016   Primary osteoarthritis of knees, bilateral 01/11/2016   Erectile dysfunction 07/30/2015   Morbid obesity (HCC) 07/18/2013   Angioedema of lips 07/13/2013   Multinodular goiter 07/13/2013   Hyperlipidemia associated with type 2 diabetes mellitus (HCC) 10/02/2011   SOB (shortness of breath) 10/02/2011   BPH with urinary obstruction 07/17/2010   Diabetes mellitus type 2, controlled (  HCC) 07/17/2010   Bilateral lower extremity edema 12/08/2008   Edema 12/08/2008   NEUROPATHY, IDIOPATHIC PERIPHERAL NEC 05/31/2007   Osteoarthritis 03/22/2007   Gout 03/15/2007   Essential hypertension 03/15/2007    PCP: Shelva Majestic, MD  REFERRING PROVIDER: Shelva Majestic, MD  THERAPY DIAG:  Other low back pain  Other abnormalities of gait and mobility  Muscle weakness (generalized)  REFERRING DIAG: Lumbar pain [M54.50]    Rationale for Evaluation and Treatment:  Rehabilitation  SUBJECTIVE:  PERTINENT PAST HISTORY:  Bilateral LE edema, DM, CAD with NSTEMI 2018, aortic aneurysm, pulmonary embolism, gout, peripheral neuropathy, SOB, C4 spinal cord injury (states he is unsure if there are still sxs from this), PAD, CHF,       PRECAUTIONS: Fall, NEEDS UPPER BODY ELEVATED IN RECUMBENT POSITIONS  WEIGHT BEARING RESTRICTIONS No  FALLS:  Has patient fallen in last 6 months? No, Number of falls: 0  MOI/History of condition:  Onset date: several weeks  SUBJECTIVE STATEMENT Pt stated that he's feeling good today, however complains of pain upwards of 6-7/10 with any activity or transfers. Pt stated that pain stayed consistent during treatment activities and eased with rest.    Pain:  Are you having pain? Yes Pain location: abdomen and low back NPRS scale:  3/10 to 10/10 Aggravating factors: sitting up straight Relieving factors: bending forward Pain description:  spasm, aching, tight Stage: Subacute 24 hour pattern: intense throughout the day   Occupation: NA  Assistive Device: FWW for ambulation at home  Hand Dominance: NA  Patient Goals/Specific Activities: reduce pain   OBJECTIVE:   DIAGNOSTIC FINDINGS:  PROCEDURE: ULTRASOUND ABDOMEN LIMITED   HISTORY: Patient is a 72 y/o  M with abdomen mass.   COMPARISON:   TECHNIQUE: Two-dimensional grayscale and color Doppler ultrasound of the of the left flank region of interest was performed.   FINDINGS: No distinct masses are identified within the left flank, area of concern. No focal fluid collections are demonstrated. No sonographic abnormalities are detected.   IMPRESSION: 1. Unremarkable ultrasound of the left flank. No sonographic evidence of mass lesion within the area of concern.   Thank you for allowing [place] to assist in the care of this patient.  GENERAL OBSERVATION/GAIT: In manual w/c, L knee flexion contracture, significant  swelling bil LE   LUMBAR AROM  Unable to test  LE MMT:  MMT Right (Eval) Left (Eval)  Hip flexion (L2, L3) 4 4  Knee extension (L3) 4 4 in available range  Knee flexion 4 in available range 4 in available range  Hip abduction    Hip extension    Hip external rotation    Hip internal rotation    Hip adduction    Ankle dorsiflexion (L4)    Ankle plantarflexion (S1)    Ankle inversion    Ankle eversion    Great Toe ext (L5)    Grossly     (Blank rows = not tested, score listed is out of 5 possible points.  N = WNL, D = diminished, C = clear for gross weakness with myotome testing, * = concordant pain with testing)  SPECIAL TESTS:  NT  MUSCLE LENGTH: NT  LE ROM:  ROM Right (Eval) Left (Eval)  Hip flexion    Hip extension    Hip abduction    Hip adduction    Hip internal rotation    Hip external rotation    Knee flexion    Knee extension    Ankle dorsiflexion  Ankle plantarflexion    Ankle inversion    Ankle eversion      (Blank rows = not tested, N = WNL, * = concordant pain with testing)  Functional Tests  Eval                                                                PALPATION:   Significant TTP LLQ of abdomen  PATIENT SURVEYS:  FOTO 52 -> 67  TODAY'S TREATMENT  [facility] Adult PT Treatment:                                                DATE: 08/20/2023  Therapeutic Exercise: Nu step 74m' Seated banded pallof press with bracing cue 3x30s, yellow -> red Abdominal bracing with PPT 2x30s Transverse abdominal breathing 2x30s  PATIENT EDUCATION:  POC, diagnosis, prognosis, and outcome measures.  Pt educated via explanation,.  Pt confirms understanding verbally.   HOME EXERCISE PROGRAM: Access Code: [email or web address] URL: [email or web address] Date: 08/20/2023 Prepared by: Sheliah Plane  Exercises - Supine March  - 1 x daily - 7 x weekly - 2 sets - Seated Transversus Abdominis Bracing  - 1 x daily - 7 x weekly - 2  sets - 1 reps - 30s hold - Seated Anti-Rotation Press With Anchored Resistance  - 1 x daily - 7 x weekly - 3 sets - 1 reps - 30s hold  Treatment priorities   Eval                                                  ASSESSMENT:  CLINICAL IMPRESSION:  Pt attended today's physical therapy session for continuation of treatment regarding low back pain and muscle spasms. Pt demonstrated good tolerance to treatment with moderate improvements with core activation and endurance throughout treatment. Pt demonstrated difficulties with level chair to chair transfers and required frequent rest breaks following transfers or intervention sets. Pt required verbal cues and  for safe and appropriate performance of interventions. Pt continues to require the skilled intervention of outpatient physical therapy to progress towards functional goals. Continue to focus on core activation patterns, ensure spare time for rest breaks and transfers.       Eval impression: Lyzander is a 72 y.o. male who presents to clinic with signs and sxs consistent with low back pain with concurrent abdominal pain and spasm.  He does have a tender palpable mass in his the LLQ of his abdomen. He states he had an ultrasound on this area which was (-), although the ultrasound listed states it was for L flank pain.  Pt has complex medical history, limited mobility, and significant pain levels which limits objective exam.  Given that he is in a forward flexed trunk posture the vast majority of the day, both in sleeping or sitting, I am hopeful that a combination of increased time in more neutral spine postures (such as sanding) and core strengthening will improve his sxs.  Tillie will benefit from skilled therapy to address relevant deficits  and improve is safety and independence with transfers and short bouts of ambulation to improve his independence and QOL.  OBJECTIVE IMPAIRMENTS: Pain, LE and core strength, gait, balance  ACTIVITY  LIMITATIONS: transfers, walking, standing, toileting  PERSONAL FACTORS: See medical history and pertinent history   REHAB POTENTIAL: Fair severe pain with multiple co morbidities   CLINICAL DECISION MAKING: Stable/uncomplicated  EVALUATION COMPLEXITY: Low   GOALS:   SHORT TERM GOALS: Target date: 09/10/2023   Jaizon will be >75% HEP compliant to improve carryover between sessions and facilitate independent management of condition  Evaluation: ongoing Goal status: INITIAL   LONG TERM GOALS: Target date: 10/08/2023   Maximo will improve FOTO score to 67 as a proxy for functional improvement  Evaluation/Baseline: 52 Goal status: INITIAL    2.  Judd will self report >/= 50% decrease in pain from evaluation to improve function in daily tasks  Evaluation/Baseline: 10/10 max pain Goal status: INITIAL   3.  Djimon will be able to navigate the ramp to enter his house, not limited by pain  Evaluation/Baseline: limited Goal status: INITIAL  4.  Zamarion will be able to walk to and from the bathroom with minimal increase in abdominal pain  Evaluation/Baseline: limited Goal status: INITIAL   5.  Cregg will report confidence in self management of condition at time of discharge with advanced HEP  Evaluation/Baseline: unable to self manage Goal status: INITIAL  6.  Daichi will be able to sit for >120 min, not limited by pain   Evaluation/Baseline: significant pain Goal status: INITIAL     PLAN: PT FREQUENCY: 1-2x/week  PT DURATION: 8 weeks  PLANNED INTERVENTIONS:  [id number]- PT Re-evaluation, 97110-Therapeutic exercises, 97530- Therapeutic activity, 97112- Neuromuscular re-education, 97535- Self Care, 29562- Manual therapy, [id number]- Gait training, [id number]- Aquatic Therapy, [phone]- Electrical stimulation (manual), [id number]- Vasopneumatic device, [id number]- Traction (mechanical), [id number]- Ionotophoresis 4mg /ml Dexamethasone, Taping, Dry Needling, Joint manipulation, and Spinal manipulation. Continue  to focus on core activation patterns   Sheliah Plane, PT, DPT 08/20/2023, 8:29 AM

## 2023-08-25 ENCOUNTER — Ambulatory Visit: Payer: Medicare PPO | Admitting: Physical Therapy

## 2023-08-25 ENCOUNTER — Encounter: Payer: Self-pay | Admitting: Physical Therapy

## 2023-08-25 DIAGNOSIS — M6281 Muscle weakness (generalized): Secondary | ICD-10-CM | POA: Diagnosis not present

## 2023-08-25 DIAGNOSIS — M5459 Other low back pain: Secondary | ICD-10-CM

## 2023-08-25 DIAGNOSIS — R2689 Other abnormalities of gait and mobility: Secondary | ICD-10-CM | POA: Diagnosis not present

## 2023-08-25 DIAGNOSIS — M545 Low back pain, unspecified: Secondary | ICD-10-CM | POA: Diagnosis not present

## 2023-08-25 NOTE — Therapy (Signed)
 OUTPATIENT PHYSICAL THERAPY THORACOLUMBAR EVALUATION  Patient Name: Franklin Hicks MRN: [id number] DOB:June 17, 1951, 72 y.o., male Today's Date: 08/25/2023      Past Medical History:  Diagnosis Date   Arthritis    knees (12/17/2017)   CAD S/P percutaneous coronary angioplasty 09/20/2016   Nstemi 08/2016. Dr. Claudene. DES- brilinta  and asa. Requests change to [facility] cardiology; 95% Ramus -> PCI Resolute Onyx DES 2.75 x 18   Central cord syndrome (HCC) 12/17/2017   Chronic combined systolic and diastolic heart failure (HCC) 10/09/2016   EF 45% and grade II diastolic after nstemi   Diet-controlled diabetes mellitus (HCC)    DJD (degenerative joint disease)    Gout    High cholesterol    Hypertension    NSTEMI (non-ST elevated myocardial infarction) (HCC) 09/20/2016   95% Ramus - > PCI    Obesity    Recurrent pulmonary embolism (HCC) 11/'14; 4/'19   a) Bilateral segmental and subsegmental pulmonary emboli with mild RV Strain.; b) after fall with C-spine Fxr --> Acute PE of right main pulmonary artery extending into multiple segments.   Past Surgical History:  Procedure Laterality Date   CARDIAC CATHETERIZATION N/A 09/19/2016   Procedure: Left Heart Cath and Coronary Angiography;  Surgeon: Salena Claudene, MD;  Location: [facility] INVASIVE CV LAB;  Service: Cardiovascular: 95% proximal Ramus Intermedius --> PCI   CARDIAC CATHETERIZATION N/A 09/19/2016   Procedure: Coronary Stent Intervention;  Surgeon: Lonni Hanson, MD;  Location: [facility] INVASIVE CV LAB;  Service: Cardiovascular: 95% ramus intermedius;  Resolute Onyx 2.75 x 18 mm drug-eluting stent   CYSTOSCOPY/RETROGRADE/URETEROSCOPY/STONE EXTRACTION WITH BASKET  7992,8000   ureteral stone    MENISCUS REPAIR Left    knee open meniscetomy   TRANSTHORACIC ECHOCARDIOGRAM  2004   no lvh nl ejection fraction   TRANSTHORACIC ECHOCARDIOGRAM  09/19/2016   In setting of NSTEMI:  EF 45-50% with diffuse hypokinesis. GR 2 DD. Mild biatrial enlargement.    Patient Active Problem List   Diagnosis Date Noted   Cholelithiases 08/09/2023   Fatty liver 08/09/2023   Peripheral arterial disease (HCC) 08/09/2023   Prostatic enlargement 08/09/2023   DDD (degenerative disc disease), lumbar 08/09/2023   Chronic kidney disease, stage 3a (HCC) 07/17/2023   Preoperative cardiovascular examination 02/05/2021   Recurrent pulmonary embolism (HCC)    Pain of left heel    C4 spinal cord injury, sequela (HCC) 12/18/2017   Tetraplegia (HCC) 12/18/2017   Aortic aneurysm (HCC) 12/16/2017   Cord compression (HCC) 12/13/2017   Chronic combined systolic and diastolic heart failure (HCC) 10/09/2016   CAD S/P percutaneous coronary angioplasty 09/20/2016   Primary osteoarthritis of knees, bilateral 01/11/2016   Erectile dysfunction 07/30/2015   Morbid obesity (HCC) 07/18/2013   Angioedema of lips 07/13/2013   Multinodular goiter 07/13/2013   Hyperlipidemia associated with type 2 diabetes mellitus (HCC) 10/02/2011   SOB (shortness of breath) 10/02/2011   BPH with urinary obstruction 07/17/2010   Diabetes mellitus type 2, controlled (HCC) 07/17/2010   Bilateral lower extremity edema 12/08/2008   Edema 12/08/2008   NEUROPATHY, IDIOPATHIC PERIPHERAL NEC 05/31/2007   Osteoarthritis 03/22/2007   Gout 03/15/2007   Essential hypertension 03/15/2007    PCP: Katrinka Garnette KIDD, MD  REFERRING PROVIDER: Katrinka Garnette KIDD, MD  THERAPY DIAG:  No diagnosis found.  REFERRING DIAG: Lumbar pain [M54.50]   Rationale for Evaluation and Treatment:  Rehabilitation  SUBJECTIVE:  PERTINENT PAST HISTORY:  Bilateral LE edema, DM, CAD with NSTEMI 2018, aortic aneurysm, pulmonary embolism, gout, peripheral neuropathy, SOB,  C4 spinal cord injury (states he is unsure if there are still sxs from this), PAD, CHF,       PRECAUTIONS: Fall, NEEDS UPPER BODY ELEVATED IN RECUMBENT POSITIONS  WEIGHT BEARING RESTRICTIONS No  FALLS:  Has patient fallen in last 6 months? No,  Number of falls: 0  MOI/History of condition:  Onset date: several weeks  SUBJECTIVE STATEMENT Reports compliance with HEP, performing every other day. Intensity of sensation gets up to 8/10 during some activity.   Pain:  Are you having pain? Yes Pain location: abdomen and low back NPRS scale:  3/10 to 10/10 Aggravating factors: sitting up straight Relieving factors: bending forward Pain description:  spasm, aching, tight Stage: Subacute 24 hour pattern: intense throughout the day   Occupation: NA  Assistive Device: FWW for ambulation at home  Hand Dominance: NA  Patient Goals/Specific Activities: reduce pain   OBJECTIVE:   DIAGNOSTIC FINDINGS:  PROCEDURE: ULTRASOUND ABDOMEN LIMITED   HISTORY: Patient is a 72 y/o  M with abdomen mass.   COMPARISON:   TECHNIQUE: Two-dimensional grayscale and color Doppler ultrasound of the of the left flank region of interest was performed.   FINDINGS: No distinct masses are identified within the left flank, area of concern. No focal fluid collections are demonstrated. No sonographic abnormalities are detected.   IMPRESSION: 1. Unremarkable ultrasound of the left flank. No sonographic evidence of mass lesion within the area of concern.   Thank you for allowing us  to assist in the care of this patient.  GENERAL OBSERVATION/GAIT: In manual w/c, L knee flexion contracture, significant swelling bil LE   LUMBAR AROM  Unable to test  LE MMT:  MMT Right (Eval) Left (Eval)  Hip flexion (L2, L3) 4 4  Knee extension (L3) 4 4 in available range  Knee flexion 4 in available range 4 in available range  Hip abduction    Hip extension    Hip external rotation    Hip internal rotation    Hip adduction    Ankle dorsiflexion (L4)    Ankle plantarflexion (S1)    Ankle inversion    Ankle eversion    Great Toe ext (L5)    Grossly     (Blank rows = not tested, score listed is out of 5 possible points.  N = WNL, D =  diminished, C = clear for gross weakness with myotome testing, * = concordant pain with testing)  SPECIAL TESTS:  NT  MUSCLE LENGTH: NT  LE ROM:  ROM Right (Eval) Left (Eval)  Hip flexion    Hip extension    Hip abduction    Hip adduction    Hip internal rotation    Hip external rotation    Knee flexion    Knee extension    Ankle dorsiflexion    Ankle plantarflexion    Ankle inversion    Ankle eversion      (Blank rows = not tested, N = WNL, * = concordant pain with testing)  Functional Tests  Eval                                                                PALPATION:   Significant TTP LLQ of abdomen  PATIENT SURVEYS:  FOTO 52 -> 67  TODAY'S TREATMENT  [facility] Adult PT Treatment:                                                DATE: 08/25/2023  Therapeutic Exercise: Seated B banded pallof press with bracing cue 3x30s, red Fowler's supine marching1x10 ea. leg w/3s hold Fowler's supine pelvic tilt 2x8 w/10s holds   Therapeutic Activity: STS transfers from elevated surface with bracing cue 2x6 Supine to sitting bed mobility 10'    [facility] Adult PT Treatment:                                                DATE: 08/20/2023  Therapeutic Exercise: Nu step 75m' Seated banded pallof press with bracing cue 3x30s, yellow -> red Abdominal bracing with PPT 2x30s Transverse abdominal breathing 2x30s  PATIENT EDUCATION:  POC, diagnosis, prognosis, and outcome measures.  Pt educated via explanation,.  Pt confirms understanding verbally.   HOME EXERCISE PROGRAM: Access Code: [email or web address] URL: [email or web address] Date: 08/20/2023 Prepared by: Mabel Kiang  Exercises - Supine March  - 1 x daily - 7 x weekly - 2 sets - Seated Transversus Abdominis Bracing  - 1 x daily - 7 x weekly - 2 sets - 1 reps - 30s hold - Seated Anti-Rotation Press With Anchored Resistance  - 1 x daily - 7 x weekly - 3 sets - 1 reps - 30s hold  Treatment  priorities   Eval                                                  ASSESSMENT:  CLINICAL IMPRESSION: Pt attended physical therapy session for continuation of treatment regarding LBP and muscle spasms. Pt tolerated treatment fair and demonstrated improvement with core activation pattern and transfer ability. Some difficulties were noted with muscular endurance and recovery times at the completion of treatment. Pt reported one bout of lightheadedness with no other adverse effects, symptoms alleviated with rest.  Pt required moderate vc for safe and appropriate performance of today's activities and technique. Pt requires spare time for rest and transfers. Pt would benefit from further technique training regarding bed mobility and transfers   Eval impression: Brion is a 72 y.o. male who presents to clinic with signs and sxs consistent with low back pain with concurrent abdominal pain and spasm.  He does have a tender palpable mass in his the LLQ of his abdomen. He states he had an ultrasound on this area which was (-), although the ultrasound listed states it was for L flank pain.  Pt has complex medical history, limited mobility, and significant pain levels which limits objective exam.  Given that he is in a forward flexed trunk posture the vast majority of the day, both in sleeping or sitting, I am hopeful that a combination of increased time in more neutral spine postures (such as sanding) and core strengthening will improve his sxs.  Jeb will benefit from skilled therapy to address relevant deficits and improve is safety and independence with transfers and short bouts of ambulation to improve his independence and QOL.  OBJECTIVE IMPAIRMENTS: Pain, LE and  core strength, gait, balance  ACTIVITY LIMITATIONS: transfers, walking, standing, toileting  PERSONAL FACTORS: See medical history and pertinent history   REHAB POTENTIAL: Fair severe pain with multiple co morbidities   CLINICAL DECISION  MAKING: Stable/uncomplicated  EVALUATION COMPLEXITY: Low   GOALS:   SHORT TERM GOALS: Target date: 09/10/2023   Ashok will be >75% HEP compliant to improve carryover between sessions and facilitate independent management of condition  Evaluation: ongoing Goal status: INITIAL   LONG TERM GOALS: Target date: 10/08/2023   Heath will improve FOTO score to 67 as a proxy for functional improvement  Evaluation/Baseline: 52 Goal status: INITIAL    2.  Martavion will self report >/= 50% decrease in pain from evaluation to improve function in daily tasks  Evaluation/Baseline: 10/10 max pain Goal status: INITIAL   3.  Ishaan will be able to navigate the ramp to enter his house, not limited by pain  Evaluation/Baseline: limited Goal status: INITIAL  4.  Josedaniel will be able to walk to and from the bathroom with minimal increase in abdominal pain  Evaluation/Baseline: limited Goal status: INITIAL   5.  Dantae will report confidence in self management of condition at time of discharge with advanced HEP  Evaluation/Baseline: unable to self manage Goal status: INITIAL  6.  Corbin will be able to sit for >120 min, not limited by pain   Evaluation/Baseline: significant pain Goal status: INITIAL     PLAN: PT FREQUENCY: 1-2x/week  PT DURATION: 8 weeks  PLANNED INTERVENTIONS:  97164- PT Re-evaluation, 97110-Therapeutic exercises, 97530- Therapeutic activity, 97112- Neuromuscular re-education, 97535- Self Care, [id number]- Manual therapy, [id number]- Gait training, [id number]- Aquatic Therapy, [phone]- Electrical stimulation (manual), [id number]- Vasopneumatic device, [id number]- Traction (mechanical), [id number]- Ionotophoresis 4mg /ml Dexamethasone , Taping, Dry Needling, Joint manipulation, and Spinal manipulation. Continue to focus on core activation patterns   Mabel Kiang, PT, DPT 08/20/2023, 8:29 AM

## 2023-08-31 NOTE — Therapy (Signed)
 OUTPATIENT PHYSICAL THERAPY TREATMENT NOTE  Patient Name: Franklin Hicks MRN: [id number] DOB:12-22-1950, 73 y.o., male Today's Date: 09/02/2023   PT End of Session - 09/02/23 1409     Visit Number 4    Number of Visits 10    Date for PT Re-Evaluation 10/08/23    Authorization Type [employer] MCR - FOTO    Progress Note Due on Visit 10    PT Start Time 1410    PT Stop Time 1440    PT Time Calculation (min) 30 min    Activity Tolerance Patient tolerated treatment well;Patient limited by fatigue    Behavior During Therapy [facility] for tasks assessed/performed               Past Medical History:  Diagnosis Date   Arthritis    knees (12/17/2017)   CAD S/P percutaneous coronary angioplasty 09/20/2016   Nstemi 08/2016. Dr. Claudene. DES- brilinta  and asa. Requests change to [facility] cardiology; 95% Ramus -> PCI Resolute Onyx DES 2.75 x 18   Central cord syndrome (HCC) 12/17/2017   Chronic combined systolic and diastolic heart failure (HCC) 10/09/2016   EF 45% and grade II diastolic after nstemi   Diet-controlled diabetes mellitus (HCC)    DJD (degenerative joint disease)    Gout    High cholesterol    Hypertension    NSTEMI (non-ST elevated myocardial infarction) (HCC) 09/20/2016   95% Ramus - > PCI    Obesity    Recurrent pulmonary embolism (HCC) 11/'14; 4/'19   a) Bilateral segmental and subsegmental pulmonary emboli with mild RV Strain.; b) after fall with C-spine Fxr --> Acute PE of right main pulmonary artery extending into multiple segments.   Past Surgical History:  Procedure Laterality Date   CARDIAC CATHETERIZATION N/A 09/19/2016   Procedure: Left Heart Cath and Coronary Angiography;  Surgeon: Salena Claudene, MD;  Location: [facility] INVASIVE CV LAB;  Service: Cardiovascular: 95% proximal Ramus Intermedius --> PCI   CARDIAC CATHETERIZATION N/A 09/19/2016   Procedure: Coronary Stent Intervention;  Surgeon: Lonni Hanson, MD;  Location: [facility] INVASIVE CV LAB;  Service: Cardiovascular: 95% ramus  intermedius;  Resolute Onyx 2.75 x 18 mm drug-eluting stent   CYSTOSCOPY/RETROGRADE/URETEROSCOPY/STONE EXTRACTION WITH BASKET  7992,8000   ureteral stone    MENISCUS REPAIR Left    knee open meniscetomy   TRANSTHORACIC ECHOCARDIOGRAM  2004   no lvh nl ejection fraction   TRANSTHORACIC ECHOCARDIOGRAM  09/19/2016   In setting of NSTEMI:  EF 45-50% with diffuse hypokinesis. GR 2 DD. Mild biatrial enlargement.   Patient Active Problem List   Diagnosis Date Noted   Cholelithiases 08/09/2023   Fatty liver 08/09/2023   Peripheral arterial disease (HCC) 08/09/2023   Prostatic enlargement 08/09/2023   DDD (degenerative disc disease), lumbar 08/09/2023   Chronic kidney disease, stage 3a (HCC) 07/17/2023   Preoperative cardiovascular examination 02/05/2021   Recurrent pulmonary embolism (HCC)    Pain of left heel    C4 spinal cord injury, sequela (HCC) 12/18/2017   Tetraplegia (HCC) 12/18/2017   Aortic aneurysm (HCC) 12/16/2017   Cord compression (HCC) 12/13/2017   Chronic combined systolic and diastolic heart failure (HCC) 10/09/2016   CAD S/P percutaneous coronary angioplasty 09/20/2016   Primary osteoarthritis of knees, bilateral 01/11/2016   Erectile dysfunction 07/30/2015   Morbid obesity (HCC) 07/18/2013   Angioedema of lips 07/13/2013   Multinodular goiter 07/13/2013   Hyperlipidemia associated with type 2 diabetes mellitus (HCC) 10/02/2011   SOB (shortness of breath) 10/02/2011   BPH with  urinary obstruction 07/17/2010   Diabetes mellitus type 2, controlled (HCC) 07/17/2010   Bilateral lower extremity edema 12/08/2008   Edema 12/08/2008   NEUROPATHY, IDIOPATHIC PERIPHERAL NEC 05/31/2007   Osteoarthritis 03/22/2007   Gout 03/15/2007   Essential hypertension 03/15/2007    PCP: Katrinka Garnette KIDD, MD  REFERRING PROVIDER: Jesus Bernardino MATSU, MD  THERAPY DIAG:  Other low back pain  Other abnormalities of gait and mobility  Muscle weakness (generalized)  REFERRING DIAG:  Lumbar pain [M54.50]   Rationale for Evaluation and Treatment:  Rehabilitation  SUBJECTIVE:  PERTINENT PAST HISTORY:  Bilateral LE edema, DM, CAD with NSTEMI 2018, aortic aneurysm, pulmonary embolism, gout, peripheral neuropathy, SOB, C4 spinal cord injury (states he is unsure if there are still sxs from this), PAD, CHF,       PRECAUTIONS: Fall, NEEDS UPPER BODY ELEVATED IN RECUMBENT POSITIONS  WEIGHT BEARING RESTRICTIONS No  FALLS:  Has patient fallen in last 6 months? No, Number of falls: 0  MOI/History of condition:  Onset date: several weeks  SUBJECTIVE STATEMENT:  Reports L flank pain worse with sitting upright.  Symptom wrap around L flank and follow a rib distribution    Pain:  Are you having pain? Yes Pain location: abdomen and low back NPRS scale:  3/10 to 10/10 Aggravating factors: sitting up straight Relieving factors: bending forward Pain description:  spasm, aching, tight Stage: Subacute 24 hour pattern: intense throughout the day   Occupation: NA  Assistive Device: FWW for ambulation at home  Hand Dominance: NA  Patient Goals/Specific Activities: reduce pain   OBJECTIVE:   DIAGNOSTIC FINDINGS:  PROCEDURE: ULTRASOUND ABDOMEN LIMITED   HISTORY: Patient is a 73 y/o  M with abdomen mass.   COMPARISON:   TECHNIQUE: Two-dimensional grayscale and color Doppler ultrasound of the of the left flank region of interest was performed.   FINDINGS: No distinct masses are identified within the left flank, area of concern. No focal fluid collections are demonstrated. No sonographic abnormalities are detected.   IMPRESSION: 1. Unremarkable ultrasound of the left flank. No sonographic evidence of mass lesion within the area of concern.   Thank you for allowing us  to assist in the care of this patient.  GENERAL OBSERVATION/GAIT: In manual w/c, L knee flexion contracture, significant swelling bil LE   LUMBAR AROM  Unable to test  LE MMT:  MMT  Right (Eval) Left (Eval)  Hip flexion (L2, L3) 4 4  Knee extension (L3) 4 4 in available range  Knee flexion 4 in available range 4 in available range  Hip abduction    Hip extension    Hip external rotation    Hip internal rotation    Hip adduction    Ankle dorsiflexion (L4)    Ankle plantarflexion (S1)    Ankle inversion    Ankle eversion    Great Toe ext (L5)    Grossly     (Blank rows = not tested, score listed is out of 5 possible points.  N = WNL, D = diminished, C = clear for gross weakness with myotome testing, * = concordant pain with testing)  SPECIAL TESTS:  NT  MUSCLE LENGTH: NT  LE ROM:  ROM Right (Eval) Left (Eval)  Hip flexion    Hip extension    Hip abduction    Hip adduction    Hip internal rotation    Hip external rotation    Knee flexion    Knee extension    Ankle dorsiflexion  Ankle plantarflexion    Ankle inversion    Ankle eversion      (Blank rows = not tested, N = WNL, * = concordant pain with testing)  Functional Tests  Eval                                                                PALPATION:   Significant TTP LLQ of abdomen  PATIENT SURVEYS:  FOTO 52 -> 67  TODAY'S TREATMENT  [facility] Adult PT Treatment:                                                DATE: 09/02/23 Therapeutic Exercise: Supine march 10/10 Supine p-ball curl ups 10x 2 B, 10/10 unilaterally Supine OH flexion with p-ball 10x   Therapeutic Activity: Transfer training WC-sit-supine and back requiring additional time due to mobility restrictions from co-morbidities.   [facility] Adult PT Treatment:                                                DATE: 08/25/2023  Therapeutic Exercise: Seated B banded pallof press with bracing cue 3x30s, red Fowler's supine marching1x10 ea. leg w/3s hold Fowler's supine pelvic tilt 2x8 w/10s holds   Therapeutic Activity: STS transfers from elevated surface with bracing cue 2x6 Supine to sitting bed mobility  10'    [facility] Adult PT Treatment:                                                DATE: 08/20/2023  Therapeutic Exercise: Nu step 21m' Seated banded pallof press with bracing cue 3x30s, yellow -> red Abdominal bracing with PPT 2x30s Transverse abdominal breathing 2x30s  PATIENT EDUCATION:  POC, diagnosis, prognosis, and outcome measures.  Pt educated via explanation,.  Pt confirms understanding verbally.   HOME EXERCISE PROGRAM: Access Code: [email or web address] URL: [email or web address] Date: 08/20/2023 Prepared by: Mabel Kiang  Exercises - Supine March  - 1 x daily - 7 x weekly - 2 sets - Seated Transversus Abdominis Bracing  - 1 x daily - 7 x weekly - 2 sets - 1 reps - 30s hold - Seated Anti-Rotation Press With Anchored Resistance  - 1 x daily - 7 x weekly - 3 sets - 1 reps - 30s hold  Treatment priorities   Eval                                                  ASSESSMENT:  CLINICAL IMPRESSION: Unchanging symptoms over the past 6 weeks.  Symptoms confined to L flank region with point tenderness over 7-8th rib when palpated in supine. Seated trunk rotational movements restricted to L with mild discomfort reported in thoracic region.  Patient able to tolerate supine core and  abdominal strengthening but weakness reported.  Patient demonstrating S&S of thoracic spine dysfunction as well as possible costocartilage irritation.  Additional imaging studies would be beneficial in determining cause of underlying symptoms  Pt attended physical therapy session for continuation of treatment regarding LBP and muscle spasms. Pt tolerated treatment fair and demonstrated improvement with core activation pattern and transfer ability. Some difficulties were noted with muscular endurance and recovery times at the completion of treatment. Pt reported one bout of lightheadedness with no other adverse effects, symptoms alleviated with rest.  Pt required moderate vc for safe and appropriate  performance of today's activities and technique. Pt requires spare time for rest and transfers. Pt would benefit from further technique training regarding bed mobility and transfers   Eval impression: Franklin Hicks is a 73 y.o. male who presents to clinic with signs and sxs consistent with low back pain with concurrent abdominal pain and spasm.  He does have a tender palpable mass in his the LLQ of his abdomen. He states he had an ultrasound on this area which was (-), although the ultrasound listed states it was for L flank pain.  Pt has complex medical history, limited mobility, and significant pain levels which limits objective exam.  Given that he is in a forward flexed trunk posture the vast majority of the day, both in sleeping or sitting, I am hopeful that a combination of increased time in more neutral spine postures (such as sanding) and core strengthening will improve his sxs.  Franklin Hicks will benefit from skilled therapy to address relevant deficits and improve is safety and independence with transfers and short bouts of ambulation to improve his independence and QOL.  OBJECTIVE IMPAIRMENTS: Pain, LE and core strength, gait, balance  ACTIVITY LIMITATIONS: transfers, walking, standing, toileting  PERSONAL FACTORS: See medical history and pertinent history   REHAB POTENTIAL: Fair severe pain with multiple co morbidities   CLINICAL DECISION MAKING: Stable/uncomplicated  EVALUATION COMPLEXITY: Low   GOALS:   SHORT TERM GOALS: Target date: 09/10/2023   Franklin Hicks will be >75% HEP compliant to improve carryover between sessions and facilitate independent management of condition  Evaluation: ongoing Goal status: INITIAL   LONG TERM GOALS: Target date: 10/08/2023   Franklin Hicks will improve FOTO score to 67 as a proxy for functional improvement  Evaluation/Baseline: 52 Goal status: INITIAL    2.  Franklin Hicks will self report >/= 50% decrease in pain from evaluation to improve function in daily  tasks  Evaluation/Baseline: 10/10 max pain Goal status: INITIAL   3.  Franklin Hicks will be able to navigate the ramp to enter his house, not limited by pain  Evaluation/Baseline: limited Goal status: INITIAL  4.  Franklin Hicks will be able to walk to and from the bathroom with minimal increase in abdominal pain  Evaluation/Baseline: limited Goal status: INITIAL   5.  Franklin Hicks will report confidence in self management of condition at time of discharge with advanced HEP  Evaluation/Baseline: unable to self manage Goal status: INITIAL  6.  Franklin Hicks will be able to sit for >120 min, not limited by pain   Evaluation/Baseline: significant pain Goal status: INITIAL     PLAN: PT FREQUENCY: 1-2x/week  PT DURATION: 8 weeks  PLANNED INTERVENTIONS:  [id number]- PT Re-evaluation, 97110-Therapeutic exercises, 97530- Therapeutic activity, 97112- Neuromuscular re-education, 97535- Self Care, [id number]- Manual therapy, [id number]- Gait training, [id number]- Aquatic Therapy, [phone]- Electrical stimulation (manual), [id number]- Vasopneumatic device, [id number]- Traction (mechanical), [id number]- Ionotophoresis 4mg /ml Dexamethasone , Taping, Dry Needling, Joint manipulation, and Spinal manipulation. Continue to focus on  core activation patterns   Mabel Kiang, PT, DPT 08/20/2023, 8:29 AM

## 2023-09-02 ENCOUNTER — Ambulatory Visit: Payer: Medicare PPO | Attending: Internal Medicine

## 2023-09-02 DIAGNOSIS — M5459 Other low back pain: Secondary | ICD-10-CM | POA: Diagnosis not present

## 2023-09-02 DIAGNOSIS — M6281 Muscle weakness (generalized): Secondary | ICD-10-CM | POA: Insufficient documentation

## 2023-09-02 DIAGNOSIS — R2689 Other abnormalities of gait and mobility: Secondary | ICD-10-CM | POA: Insufficient documentation

## 2023-09-07 NOTE — Therapy (Signed)
 OUTPATIENT PHYSICAL THERAPY TREATMENT NOTE  Patient Name: Franklin Hicks MRN: [id number] DOB:27-Sep-1950, 73 y.o., male Today's Date: 09/09/2023   PT End of Session - 09/09/23 1357     Visit Number 5    Number of Visits 10    Date for PT Re-Evaluation 10/08/23    Authorization Type [employer] MCR - FOTO    Progress Note Due on Visit 10    PT Start Time 1400    PT Stop Time 1440    PT Time Calculation (min) 40 min    Activity Tolerance Patient tolerated treatment well;Patient limited by fatigue    Behavior During Therapy [facility] for tasks assessed/performed                Past Medical History:  Diagnosis Date   Arthritis    "knees" (12/17/2017)   CAD S/P percutaneous coronary angioplasty 09/20/2016   Nstemi 08/2016. Dr. Sharyn Deforest. DES- brilinta  and asa. Requests change to [facility] cardiology; 95% Ramus -> PCI Resolute Onyx DES 2.75 x 18   Central cord syndrome (HCC) 12/17/2017   Chronic combined systolic and diastolic heart failure (HCC) 10/09/2016   EF 45% and grade II diastolic after nstemi   Diet-controlled diabetes mellitus (HCC)    DJD (degenerative joint disease)    Gout    High cholesterol    Hypertension    NSTEMI (non-ST elevated myocardial infarction) (HCC) 09/20/2016   95% Ramus - > PCI    Obesity    Recurrent pulmonary embolism (HCC) 11/'14; 4/'19   a) Bilateral segmental and subsegmental pulmonary emboli with mild RV Strain.; b) after fall with C-spine Fxr --> Acute PE of right main pulmonary artery extending into multiple segments.   Past Surgical History:  Procedure Laterality Date   CARDIAC CATHETERIZATION N/A 09/19/2016   Procedure: Left Heart Cath and Coronary Angiography;  Surgeon: Pasqual Bone, MD;  Location: [facility] INVASIVE CV LAB;  Service: Cardiovascular: 95% proximal Ramus Intermedius --> PCI   CARDIAC CATHETERIZATION N/A 09/19/2016   Procedure: Coronary Stent Intervention;  Surgeon: Sammy Crisp, MD;  Location: [facility] INVASIVE CV LAB;  Service: Cardiovascular: 95% ramus  intermedius;  Resolute Onyx 2.75 x 18 mm drug-eluting stent   CYSTOSCOPY/RETROGRADE/URETEROSCOPY/STONE EXTRACTION WITH BASKET  4098,1191   ureteral stone    MENISCUS REPAIR Left    knee open meniscetomy   TRANSTHORACIC ECHOCARDIOGRAM  2004   no lvh nl ejection fraction   TRANSTHORACIC ECHOCARDIOGRAM  09/19/2016   In setting of NSTEMI:  EF 45-50% with diffuse hypokinesis. GR 2 DD. Mild biatrial enlargement.   Patient Active Problem List   Diagnosis Date Noted   Cholelithiases 08/09/2023   Fatty liver 08/09/2023   Peripheral arterial disease (HCC) 08/09/2023   Prostatic enlargement 08/09/2023   DDD (degenerative disc disease), lumbar 08/09/2023   Chronic kidney disease, stage 3a (HCC) 07/17/2023   Preoperative cardiovascular examination 02/05/2021   Recurrent pulmonary embolism (HCC)    Pain of left heel    C4 spinal cord injury, sequela (HCC) 12/18/2017   Tetraplegia (HCC) 12/18/2017   Aortic aneurysm (HCC) 12/16/2017   Cord compression (HCC) 12/13/2017   Chronic combined systolic and diastolic heart failure (HCC) 10/09/2016   CAD S/P percutaneous coronary angioplasty 09/20/2016   Primary osteoarthritis of knees, bilateral 01/11/2016   Erectile dysfunction 07/30/2015   Morbid obesity (HCC) 07/18/2013   Angioedema of lips 07/13/2013   Multinodular goiter 07/13/2013   Hyperlipidemia associated with type 2 diabetes mellitus (HCC) 10/02/2011   SOB (shortness of breath) 10/02/2011   BPH  with urinary obstruction 07/17/2010   Diabetes mellitus type 2, controlled (HCC) 07/17/2010   Bilateral lower extremity edema 12/08/2008   Edema 12/08/2008   NEUROPATHY, IDIOPATHIC PERIPHERAL NEC 05/31/2007   Osteoarthritis 03/22/2007   Gout 03/15/2007   Essential hypertension 03/15/2007    PCP: Almira Jaeger, MD  REFERRING PROVIDER: Anthon Kins, MD  THERAPY DIAG:  Other low back pain  Other abnormalities of gait and mobility  Muscle weakness (generalized)  REFERRING DIAG:  Lumbar pain [M54.50]   Rationale for Evaluation and Treatment:  Rehabilitation  SUBJECTIVE:  PERTINENT PAST HISTORY:  Bilateral LE edema, DM, CAD with NSTEMI 2018, aortic aneurysm, pulmonary embolism, gout, peripheral neuropathy, SOB, C4 spinal cord injury (states he is unsure if there are still sxs from this), PAD, CHF,       PRECAUTIONS: Fall, NEEDS UPPER BODY ELEVATED IN RECUMBENT POSITIONS  WEIGHT BEARING RESTRICTIONS No  FALLS:  Has patient fallen in last 6 months? No, Number of falls: 0  MOI/History of condition:  Onset date: several weeks  SUBJECTIVE STATEMENT:   Subjective: Patient reports increased pain following last session.  Notes relief with forward flexion and R SB, alleviating L thoracic compression forces.    Pain:  Are you having pain? Yes Pain location: abdomen and low back NPRS scale:  3/10 to 10/10 Aggravating factors: sitting up straight Relieving factors: bending forward Pain description:  spasm, aching, tight Stage: Subacute 24 hour pattern: intense throughout the day   Occupation: NA  Assistive Device: FWW for ambulation at home  Hand Dominance: NA  Patient Goals/Specific Activities: reduce pain   OBJECTIVE:   DIAGNOSTIC FINDINGS:  PROCEDURE: ULTRASOUND ABDOMEN LIMITED   HISTORY: Patient is a 73 y/o  M with abdomen mass.   COMPARISON:   TECHNIQUE: Two-dimensional grayscale and color Doppler ultrasound of the of the left flank region of interest was performed.   FINDINGS: No distinct masses are identified within the left flank, area of concern. No focal fluid collections are demonstrated. No sonographic abnormalities are detected.   IMPRESSION: 1. Unremarkable ultrasound of the left flank. No sonographic evidence of mass lesion within the area of concern.   Thank you for allowing us  to assist in the care of this patient.  GENERAL OBSERVATION/GAIT: In manual w/c, L knee flexion contracture, significant swelling bil  LE   LUMBAR AROM  Unable to test  LE MMT:  MMT Right (Eval) Left (Eval)  Hip flexion (L2, L3) 4 4  Knee extension (L3) 4 4 in available range  Knee flexion 4 in available range 4 in available range  Hip abduction    Hip extension    Hip external rotation    Hip internal rotation    Hip adduction    Ankle dorsiflexion (L4)    Ankle plantarflexion (S1)    Ankle inversion    Ankle eversion    Great Toe ext (L5)    Grossly     (Blank rows = not tested, score listed is out of 5 possible points.  N = WNL, D = diminished, C = clear for gross weakness with myotome testing, * = concordant pain with testing)  SPECIAL TESTS:  NT  MUSCLE LENGTH: NT  LE ROM:  ROM Right (Eval) Left (Eval)  Hip flexion    Hip extension    Hip abduction    Hip adduction    Hip internal rotation    Hip external rotation    Knee flexion    Knee extension  Ankle dorsiflexion    Ankle plantarflexion    Ankle inversion    Ankle eversion      (Blank rows = not tested, N = WNL, * = concordant pain with testing)  Functional Tests  Eval                                                                PALPATION:   Significant TTP LLQ of abdomen  PATIENT SURVEYS:  FOTO 52 -> 67  TODAY'S TREATMENT  [facility] Adult PT Treatment:                                                DATE: 09/09/23 Therapeutic Exercise: Seated latissimus press 3s hold 15s Seated lat press with alt FAQ 10/10 Seated lat press with alt march 10/10 seated core exercises of hip tosses, shoulder tosses, chops and Victories, 15 reps with 2000g weighted ball   [facility] Adult PT Treatment:                                                DATE: 09/02/23 Therapeutic Exercise: Supine march 10/10 Supine p-ball curl ups 10x 2 B, 10/10 unilaterally Supine OH flexion with p-ball 10x   Therapeutic Activity: Transfer training WC-sit-supine and back requiring additional time due to mobility restrictions from  co-morbidities.   [facility] Adult PT Treatment:                                                DATE: 08/25/2023  Therapeutic Exercise: Seated B banded pallof press with bracing cue 3x30s, red Fowler's supine marching1x10 ea. leg w/3s hold Fowler's supine pelvic tilt 2x8 w/10s holds   Therapeutic Activity: STS transfers from elevated surface with bracing cue 2x6 Supine to sitting bed mobility 10'    [facility] Adult PT Treatment:                                                DATE: 08/20/2023  Therapeutic Exercise: Nu step 25m' Seated banded pallof press with bracing cue 3x30s, yellow -> red Abdominal bracing with PPT 2x30s Transverse abdominal breathing 2x30s  PATIENT EDUCATION:  POC, diagnosis, prognosis, and outcome measures.  Pt educated via explanation,.  Pt confirms understanding verbally.   HOME EXERCISE PROGRAM: Access Code: [email or web address] URL: [email or web address] Date: 08/20/2023 Prepared by: Albesa Huguenin  Exercises - Supine March  - 1 x daily - 7 x weekly - 2 sets - Seated Transversus Abdominis Bracing  - 1 x daily - 7 x weekly - 2 sets - 1 reps - 30s hold - Seated Anti-Rotation Press With Anchored Resistance  - 1 x daily - 7 x weekly - 3 sets - 1 reps - 30s hold  Treatment priorities   Eval  ASSESSMENT:  CLINICAL IMPRESSION: Continued L flank pain exacerbated by thoracic extension (in seated).  Today's session focused on seated core exercises.  Symptoms exacerbated with L trunk rotation and extension moments.  Discussed thoracic spine pathology as well as benefit of pursuing imaging studies.  Unchanging symptoms over the past 6 weeks.  Symptoms confined to L flank region with point tenderness over 7-8th rib when palpated in supine. Seated trunk rotational movements restricted to L with mild discomfort reported in thoracic region.  Patient able to tolerate supine core and abdominal strengthening but weakness  reported.  Patient demonstrating S&S of thoracic spine dysfunction as well as possible costocartilage irritation.  Additional imaging studies would be beneficial in determining cause of underlying symptoms  Pt attended physical therapy session for continuation of treatment regarding LBP and muscle spasms. Pt tolerated treatment fair and demonstrated improvement with core activation pattern and transfer ability. Some difficulties were noted with muscular endurance and recovery times at the completion of treatment. Pt reported one bout of lightheadedness with no other adverse effects, symptoms alleviated with rest.  Pt required moderate vc for safe and appropriate performance of today's activities and technique. Pt requires spare time for rest and transfers. Pt would benefit from further technique training regarding bed mobility and transfers   Eval impression: Amaury is a 73 y.o. male who presents to clinic with signs and sxs consistent with low back pain with concurrent abdominal pain and spasm.  He does have a tender palpable mass in his the LLQ of his abdomen. He states he had an ultrasound on this area which was (-), although the ultrasound listed states it was for L flank pain.  Pt has complex medical history, limited mobility, and significant pain levels which limits objective exam.  Given that he is in a forward flexed trunk posture the vast majority of the day, both in sleeping or sitting, I am hopeful that a combination of increased time in more neutral spine postures (such as sanding) and core strengthening will improve his sxs.  Gottfried will benefit from skilled therapy to address relevant deficits and improve is safety and independence with transfers and short bouts of ambulation to improve his independence and QOL.  OBJECTIVE IMPAIRMENTS: Pain, LE and core strength, gait, balance  ACTIVITY LIMITATIONS: transfers, walking, standing, toileting  PERSONAL FACTORS: See medical history and pertinent  history   REHAB POTENTIAL: Fair severe pain with multiple co morbidities   CLINICAL DECISION MAKING: Stable/uncomplicated  EVALUATION COMPLEXITY: Low   GOALS:   SHORT TERM GOALS: Target date: 09/10/2023   Owenn will be >75% HEP compliant to improve carryover between sessions and facilitate independent management of condition  Evaluation: ongoing Goal status: INITIAL   LONG TERM GOALS: Target date: 10/08/2023   Atzin will improve FOTO score to 67 as a proxy for functional improvement  Evaluation/Baseline: 52 Goal status: INITIAL    2.  Hendryx will self report >/= 50% decrease in pain from evaluation to improve function in daily tasks  Evaluation/Baseline: 10/10 max pain Goal status: INITIAL   3.  Teal will be able to navigate the ramp to enter his house, not limited by pain  Evaluation/Baseline: limited Goal status: INITIAL  4.  Hosam will be able to walk to and from the bathroom with minimal increase in abdominal pain  Evaluation/Baseline: limited Goal status: INITIAL   5.  Adelfo will report confidence in self management of condition at time of discharge with advanced HEP  Evaluation/Baseline: unable to self manage Goal  status: INITIAL  6.  Daymond will be able to sit for >120 min, not limited by pain   Evaluation/Baseline: significant pain Goal status: INITIAL     PLAN: PT FREQUENCY: 1-2x/week  PT DURATION: 8 weeks  PLANNED INTERVENTIONS:  [id number]- PT Re-evaluation, 97110-Therapeutic exercises, 97530- Therapeutic activity, 97112- Neuromuscular re-education, 97535- Self Care, 69629- Manual therapy, [id number]- Gait training, [id number]- Aquatic Therapy, [phone]- Electrical stimulation (manual), [id number]- Vasopneumatic device, [id number]- Traction (mechanical), [id number]- Ionotophoresis 4mg /ml Dexamethasone , Taping, Dry Needling, Joint manipulation, and Spinal manipulation. Continue to focus on core activation patterns   Albesa Huguenin, PT, DPT 08/20/2023, 8:29 AM

## 2023-09-09 ENCOUNTER — Ambulatory Visit: Payer: Medicare PPO

## 2023-09-09 DIAGNOSIS — R2689 Other abnormalities of gait and mobility: Secondary | ICD-10-CM

## 2023-09-09 DIAGNOSIS — M6281 Muscle weakness (generalized): Secondary | ICD-10-CM | POA: Diagnosis not present

## 2023-09-09 DIAGNOSIS — M5459 Other low back pain: Secondary | ICD-10-CM | POA: Diagnosis not present

## 2023-09-14 ENCOUNTER — Encounter: Payer: Self-pay | Admitting: Physical Therapy

## 2023-09-14 ENCOUNTER — Ambulatory Visit: Payer: Medicare PPO | Admitting: Physical Therapy

## 2023-09-14 DIAGNOSIS — M5459 Other low back pain: Secondary | ICD-10-CM

## 2023-09-14 DIAGNOSIS — R2689 Other abnormalities of gait and mobility: Secondary | ICD-10-CM

## 2023-09-14 DIAGNOSIS — M6281 Muscle weakness (generalized): Secondary | ICD-10-CM | POA: Diagnosis not present

## 2023-09-14 NOTE — Therapy (Signed)
OUTPATIENT PHYSICAL THERAPY PROGRESS/DISCHARGE NOTE  Patient Name: Franklin Hicks MRN: [id number] DOB:04/22/1951, 73 y.o., male Today's Date: 09/14/2023   PHYSICAL THERAPY DISCHARGE SUMMARY  Visits from Start of Care: 6  Current functional level related to goals / functional outcomes: Pt functional level is not adequately progressing towards functional goals.   Remaining deficits: Pain, mobility, ADL function, details in assessment   Education / Equipment: Long term HEP, details in assessment   Patient agrees to discharge. Patient goals were partially met. Patient is being discharged due to lack of progress.    PT End of Session - 09/14/23 1713     Visit Number 6    Number of Visits 10    Date for PT Re-Evaluation 10/08/23    Authorization Type [employer] MCR - FOTO    PT Start Time 1615    PT Stop Time 1700    PT Time Calculation (min) 45 min    Equipment Utilized During Treatment Other (comment)    Activity Tolerance Patient tolerated treatment well;Patient limited by fatigue;Patient limited by pain    Behavior During Therapy [facility] for tasks assessed/performed                 Past Medical History:  Diagnosis Date   Arthritis    "knees" (12/17/2017)   CAD S/P percutaneous coronary angioplasty 09/20/2016   Nstemi 08/2016. Dr. Algie Coffer. DES- brilinta and asa. Requests change to [facility] cardiology; 95% Ramus -> PCI Resolute Onyx DES 2.75 x 18   Central cord syndrome (HCC) 12/17/2017   Chronic combined systolic and diastolic heart failure (HCC) 10/09/2016   EF 45% and grade II diastolic after nstemi   Diet-controlled diabetes mellitus (HCC)    DJD (degenerative joint disease)    Gout    High cholesterol    Hypertension    NSTEMI (non-ST elevated myocardial infarction) (HCC) 09/20/2016   95% Ramus - > PCI    Obesity    Recurrent pulmonary embolism (HCC) 11/'14; 4/'19   a) Bilateral segmental and subsegmental pulmonary emboli with mild RV Strain.; b) after fall with C-spine  Fxr --> Acute PE of right main pulmonary artery extending into multiple segments.   Past Surgical History:  Procedure Laterality Date   CARDIAC CATHETERIZATION N/A 09/19/2016   Procedure: Left Heart Cath and Coronary Angiography;  Surgeon: Orpah Cobb, MD;  Location: [facility] INVASIVE CV LAB;  Service: Cardiovascular: 95% proximal Ramus Intermedius --> PCI   CARDIAC CATHETERIZATION N/A 09/19/2016   Procedure: Coronary Stent Intervention;  Surgeon: Yvonne Kendall, MD;  Location: [facility] INVASIVE CV LAB;  Service: Cardiovascular: 95% ramus intermedius;  Resolute Onyx 2.75 x 18 mm drug-eluting stent   CYSTOSCOPY/RETROGRADE/URETEROSCOPY/STONE EXTRACTION WITH BASKET  9563,8756   ureteral stone    MENISCUS REPAIR Left    knee open meniscetomy   TRANSTHORACIC ECHOCARDIOGRAM  2004   no lvh nl ejection fraction   TRANSTHORACIC ECHOCARDIOGRAM  09/19/2016   In setting of NSTEMI:  EF 45-50% with diffuse hypokinesis. GR 2 DD. Mild biatrial enlargement.   Patient Active Problem List   Diagnosis Date Noted   Cholelithiases 08/09/2023   Fatty liver 08/09/2023   Peripheral arterial disease (HCC) 08/09/2023   Prostatic enlargement 08/09/2023   DDD (degenerative disc disease), lumbar 08/09/2023   Chronic kidney disease, stage 3a (HCC) 07/17/2023   Preoperative cardiovascular examination 02/05/2021   Recurrent pulmonary embolism (HCC)    Pain of left heel    C4 spinal cord injury, sequela (HCC) 12/18/2017   Tetraplegia (HCC) 12/18/2017  Aortic aneurysm (HCC) 12/16/2017   Cord compression (HCC) 12/13/2017   Chronic combined systolic and diastolic heart failure (HCC) 10/09/2016   CAD S/P percutaneous coronary angioplasty 09/20/2016   Primary osteoarthritis of knees, bilateral 01/11/2016   Erectile dysfunction 07/30/2015   Morbid obesity (HCC) 07/18/2013   Angioedema of lips 07/13/2013   Multinodular goiter 07/13/2013   Hyperlipidemia associated with type 2 diabetes mellitus (HCC) 10/02/2011   SOB (shortness  of breath) 10/02/2011   BPH with urinary obstruction 07/17/2010   Diabetes mellitus type 2, controlled (HCC) 07/17/2010   Bilateral lower extremity edema 12/08/2008   Edema 12/08/2008   NEUROPATHY, IDIOPATHIC PERIPHERAL NEC 05/31/2007   Osteoarthritis 03/22/2007   Gout 03/15/2007   Essential hypertension 03/15/2007    PCP: Shelva Majestic, MD  REFERRING PROVIDER: Shelva Majestic, MD  THERAPY DIAG:  Other low back pain  Other abnormalities of gait and mobility  REFERRING DIAG: Lumbar pain [M54.50]   Rationale for Evaluation and Treatment:  Rehabilitation  SUBJECTIVE:  PERTINENT PAST HISTORY:  Bilateral LE edema, DM, CAD with NSTEMI 2018, aortic aneurysm, pulmonary embolism, gout, peripheral neuropathy, SOB, C4 spinal cord injury (states he is unsure if there are still sxs from this), PAD, CHF,       PRECAUTIONS: Fall, NEEDS UPPER BODY ELEVATED IN RECUMBENT POSITIONS  WEIGHT BEARING RESTRICTIONS No  FALLS:  Has patient fallen in last 6 months? No, Number of falls: 0  MOI/History of condition:  Onset date: several weeks  SUBJECTIVE STATEMENT:   Subjective: Patient reports increased pain following last session.  Notes relief with forward flexion and R SB, alleviating L thoracic compression forces.    Pain:  Are you having pain? Yes Pain location: abdomen and low back NPRS scale:  3/10 to 10/10 Aggravating factors: sitting up straight Relieving factors: bending forward Pain description:  spasm, aching, tight Stage: Subacute 24 hour pattern: intense throughout the day   Occupation: NA  Assistive Device: FWW for ambulation at home  Hand Dominance: NA  Patient Goals/Specific Activities: reduce pain   OBJECTIVE:   DIAGNOSTIC FINDINGS:  PROCEDURE: ULTRASOUND ABDOMEN LIMITED   HISTORY: Patient is a 73 y/o  M with abdomen mass.   COMPARISON:   TECHNIQUE: Two-dimensional grayscale and color Doppler ultrasound of the of the left flank region of interest  was performed.   FINDINGS: No distinct masses are identified within the left flank, area of concern. No focal fluid collections are demonstrated. No sonographic abnormalities are detected.   IMPRESSION: 1. Unremarkable ultrasound of the left flank. No sonographic evidence of mass lesion within the area of concern.   Thank you for allowing [place] to assist in the care of this patient.  GENERAL OBSERVATION/GAIT: In manual w/c, L knee flexion contracture, significant swelling bil LE   LUMBAR AROM  Unable to test  LE MMT:  MMT Right (Eval) Left (Eval)  Hip flexion (L2, L3) 4 4  Knee extension (L3) 4 4 in available range  Knee flexion 4 in available range 4 in available range  Hip abduction    Hip extension    Hip external rotation    Hip internal rotation    Hip adduction    Ankle dorsiflexion (L4)    Ankle plantarflexion (S1)    Ankle inversion    Ankle eversion    Great Toe ext (L5)    Grossly     (Blank rows = not tested, score listed is out of 5 possible points.  N = WNL, D = diminished,  C = clear for gross weakness with myotome testing, * = concordant pain with testing)  SPECIAL TESTS:  NT  MUSCLE LENGTH: NT  LE ROM:  ROM Right (Eval) Left (Eval)  Hip flexion    Hip extension    Hip abduction    Hip adduction    Hip internal rotation    Hip external rotation    Knee flexion    Knee extension    Ankle dorsiflexion    Ankle plantarflexion    Ankle inversion    Ankle eversion      (Blank rows = not tested, N = WNL, * = concordant pain with testing)  Functional Tests  Eval                                                                PALPATION:   Significant TTP LLQ of abdomen  PATIENT SURVEYS:  FOTO 52 -> 67  TODAY'S TREATMENT  [facility] Adult PT Treatment:                                                DATE: 09/09/23 Therapeutic Exercise: Seated latissimus press 3s hold 15s Seated lat press with alt FAQ 10/10 Seated lat press  with alt march 10/10 seated core exercises of hip tosses, shoulder tosses, chops and Victories, 15 reps with 2000g weighted ball   [facility] Adult PT Treatment:                                                DATE: 09/02/23 Therapeutic Exercise: Supine march 10/10 Supine p-ball curl ups 10x 2 B, 10/10 unilaterally Supine OH flexion with p-ball 10x   Therapeutic Activity: Transfer training WC-sit-supine and back requiring additional time due to mobility restrictions from co-morbidities.   [facility] Adult PT Treatment:                                                DATE: 08/25/2023  Therapeutic Exercise: Seated B banded pallof press with bracing cue 3x30s, red Fowler's supine marching1x10 ea. leg w/3s hold Fowler's supine pelvic tilt 2x8 w/10s holds   Therapeutic Activity: STS transfers from elevated surface with bracing cue 2x6 Supine to sitting bed mobility 10'    [facility] Adult PT Treatment:                                                DATE: 08/20/2023  Therapeutic Exercise: Nu step 90m' Seated banded pallof press with bracing cue 3x30s, yellow -> red Abdominal bracing with PPT 2x30s Transverse abdominal breathing 2x30s  PATIENT EDUCATION:  POC, diagnosis, prognosis, and outcome measures.  Pt educated via explanation,.  Pt confirms understanding verbally.   HOME EXERCISE PROGRAM: Access Code: [email or web address] URL: [email or web address] Date:  09/14/2023 Prepared by: Sheliah Plane  Exercises - Supine March  - 1 x daily - 7 x weekly - 2 sets - 12 reps - Lat Activation With Ball (Hemiplegia)  - 1 x daily - 7 x weekly - 3 sets - 12 reps - 5s hold - Ab Prep  - 1 x daily - 7 x weekly - 3 sets - 10 reps - 5s hold - Sit to Stand  - 1 x daily - 7 x weekly - 2 sets - 6-10 reps - Walking  - 1 x daily - 7 x weekly - 5-10 sets - 1 reps - 48m hold  Treatment priorities   Eval                                                  ASSESSMENT:  CLINICAL IMPRESSION:  Pt attended  physical therapy session for re-evaluation regarding Low back pain. Pt has demonstrated minimal improvement with pain spikes, overall, function, centralization of pain. difficulties continue with activity tolerance and pain management. Pt scored 52 on FOTO this session, which is equivalent to initial encounter, demonstrating minimal perception of progress. Pt continues to have evolving symptoms with no change in function. Due to plateau in progress, pt is to be discharged from therapeutic services with the recommendation to follow up with PCP alongside a recommendation that pt is referred to orthopedics or neurology for specialist options on Mr. Roa symptom presentation.     Eval impression: Franklin Hicks is a 73 y.o. male who presents to clinic with signs and sxs consistent with low back pain with concurrent abdominal pain and spasm.  He does have a tender palpable mass in his the LLQ of his abdomen. He states he had an ultrasound on this area which was (-), although the ultrasound listed states it was for L flank pain.  Pt has complex medical history, limited mobility, and significant pain levels which limits objective exam.  Given that he is in a forward flexed trunk posture the vast majority of the day, both in sleeping or sitting, I am hopeful that a combination of increased time in more neutral spine postures (such as sanding) and core strengthening will improve his sxs.  Darryal will benefit from skilled therapy to address relevant deficits and improve is safety and independence with transfers and short bouts of ambulation to improve his independence and QOL.  OBJECTIVE IMPAIRMENTS: Pain, LE and core strength, gait, balance  ACTIVITY LIMITATIONS: transfers, walking, standing, toileting  PERSONAL FACTORS: See medical history and pertinent history   REHAB POTENTIAL: Fair severe pain with multiple co morbidities   CLINICAL DECISION MAKING: Stable/uncomplicated  EVALUATION COMPLEXITY:  Low   GOALS:   SHORT TERM GOALS: Target date: 09/10/2023   Franklin Hicks will be >75% HEP compliant to improve carryover between sessions and facilitate independent management of condition  Evaluation: ongoing Goal status: MET   LONG TERM GOALS: Target date: 10/08/2023   Franklin Hicks will improve FOTO score to 67 as a proxy for functional improvement  Evaluation/Baseline: 52 Goal status: NO CHANGE 09/14/2023     2.  Franklin Hicks will self report >/= 50% decrease in pain from evaluation to improve function in daily tasks  Evaluation/Baseline: 10/10 max pain Goal status: NO CHANGE 09/14/2023   3.  Franklin Hicks will be able to navigate the ramp to enter his house, not limited by pain  Evaluation/Baseline: limited Goal status: NO CHANGE 09/14/2023  4.  Franklin Hicks will be able to walk to and from the bathroom with minimal increase in abdominal pain  Evaluation/Baseline: limited Goal status: NO CHANGE1/20/2025   5.  Franklin Hicks will report confidence in self management of condition at time of discharge with advanced HEP  Evaluation/Baseline: unable to self manage Goal status: MET  6.  Franklin Hicks will be able to sit for >120 min, not limited by pain   Evaluation/Baseline: significant pain Goal status: MET 09/14/2023     PLAN: PT FREQUENCY: 1-2x/week  PT DURATION: 8 weeks  PLANNED INTERVENTIONS:  [id number]- PT Re-evaluation, 97110-Therapeutic exercises, 97530- Therapeutic activity, 97112- Neuromuscular re-education, 97535- Self Care, [id number]- Manual therapy, [id number]- Gait training, [id number]- Aquatic Therapy, [phone]- Electrical stimulation (manual), [id number]- Vasopneumatic device, [id number]- Traction (mechanical), [id number]- Ionotophoresis 4mg /ml Dexamethasone, Taping, Dry Needling, Joint manipulation, and Spinal manipulation. Continue to focus on core activation patterns   Sheliah Plane, PT, DPT 08/20/2023, 8:29 AM

## 2023-09-16 ENCOUNTER — Ambulatory Visit: Payer: Medicare PPO

## 2023-09-16 VITALS — Wt 389.0 lb

## 2023-09-16 DIAGNOSIS — Z0000 Encounter for general adult medical examination without abnormal findings: Secondary | ICD-10-CM

## 2023-09-16 NOTE — Progress Notes (Signed)
Subjective:   Franklin Hicks is a 73 y.o. male who presents for Medicare Annual/Subsequent preventive examination.  Visit Complete: Virtual I connected with  Franklin Hicks on 09/16/23 by a audio enabled telemedicine application and verified that I am speaking with the correct person using two identifiers.  Patient Location: Home  Provider Location: Home Office  I discussed the limitations of evaluation and management by telemedicine. The patient expressed understanding and agreed to proceed.  Vital Signs: Because this visit was a virtual/telehealth visit, some criteria may be missing or patient reported. Any vitals not documented were not able to be obtained and vitals that have been documented are patient reported.   Cardiac Risk Factors include: advanced age (>18men, >58 women)     Objective:    Today's Vitals   09/16/23 1429  Weight: (!) 389 lb (176.4 kg)   Body mass index is 52.76 kg/m.     09/16/2023    2:33 PM 08/13/2023    3:51 PM 07/09/2022    3:19 PM 07/07/2022   10:11 AM 05/08/2022   10:25 AM 06/20/2021   10:22 AM 02/18/2021   11:45 AM  Advanced Directives  Does Patient Have a Medical Advance Directive? No No No No No Yes No  Does patient want to make changes to medical advance directive?      Yes (MAU/Ambulatory/Procedural Areas - Information given)   Would patient like information on creating a medical advance directive? No - Patient declined  Yes (MAU/Ambulatory/Procedural Areas - Information given) No - Patient declined   No - Patient declined    Current Medications (verified) Outpatient Encounter Medications as of 09/16/2023  Medication Sig   acetaminophen (TYLENOL) 650 MG CR tablet Take 650-1,300 mg by mouth every 8 (eight) hours as needed for pain.   aspirin EC 81 MG tablet Take 1 tablet (81 mg total) by mouth daily. (Patient taking differently: Take 81 mg by mouth in the morning.)   atorvastatin (LIPITOR) 80 MG tablet TAKE 1 TABLET BY MOUTH DAILY AT 6 PM.    Chromium Picolinate (CHROMIUM PICOLATE PO) Take 1 tablet by mouth 3 (three) times a week.   clotrimazole-betamethasone (LOTRISONE) cream Apply to both lower extremities twice daily.   diclofenac sodium (VOLTAREN) 1 % GEL Apply 1 application topically 3 (three) times daily. Both knees   fluticasone (FLONASE) 50 MCG/ACT nasal spray SPRAY 2 SPRAYS INTO EACH NOSTRIL EVERY DAY   metoprolol tartrate (LOPRESSOR) 50 MG tablet Take 1 tablet (50 mg total) by mouth 2 (two) times daily.   Multiple Vitamin (MULTIVITAMIN WITH MINERALS) TABS tablet Take 1 tablet by mouth daily.   Potassium Citrate 15 MEQ (1620 MG) TBCR Take 1 tablet by mouth 2 (two) times daily.   rivaroxaban (XARELTO) 20 MG TABS tablet Take 1 tablet (20 mg total) by mouth daily.   terazosin (HYTRIN) 10 MG capsule TAKE 1 CAPSULE BY MOUTH EVERYDAY AT BEDTIME   tizanidine (ZANAFLEX) 2 MG capsule Take 1 capsule (2 mg total) by mouth 3 (three) times daily as needed for muscle spasms.   vitamin C (ASCORBIC ACID) 500 MG tablet Take 500 mg by mouth daily in the afternoon.   ammonium lactate (AMLACTIN) 12 % lotion Apply 1 application topically as needed for dry skin.   [DISCONTINUED] amoxicillin (AMOXIL) 875 MG tablet Take 875 mg by mouth 2 (two) times daily.   No facility-administered encounter medications on file as of 09/16/2023.    Allergies (verified) Ace inhibitors, Gabapentin, and Other   History: Past  Medical History:  Diagnosis Date   Arthritis    "knees" (12/17/2017)   CAD S/P percutaneous coronary angioplasty 09/20/2016   Nstemi 08/2016. Dr. Algie Coffer. DES- brilinta and asa. Requests change to [facility] cardiology; 95% Ramus -> PCI Resolute Onyx DES 2.75 x 18   Central cord syndrome (HCC) 12/17/2017   Chronic combined systolic and diastolic heart failure (HCC) 10/09/2016   EF 45% and grade II diastolic after nstemi   Diet-controlled diabetes mellitus (HCC)    DJD (degenerative joint disease)    Gout    High cholesterol    Hypertension     NSTEMI (non-ST elevated myocardial infarction) (HCC) 09/20/2016   95% Ramus - > PCI    Obesity    Recurrent pulmonary embolism (HCC) 11/'14; 4/'19   a) Bilateral segmental and subsegmental pulmonary emboli with mild RV Strain.; b) after fall with C-spine Fxr --> Acute PE of right main pulmonary artery extending into multiple segments.   Past Surgical History:  Procedure Laterality Date   CARDIAC CATHETERIZATION N/A 09/19/2016   Procedure: Left Heart Cath and Coronary Angiography;  Surgeon: Orpah Cobb, MD;  Location: [facility] INVASIVE CV LAB;  Service: Cardiovascular: 95% proximal Ramus Intermedius --> PCI   CARDIAC CATHETERIZATION N/A 09/19/2016   Procedure: Coronary Stent Intervention;  Surgeon: Yvonne Kendall, MD;  Location: [facility] INVASIVE CV LAB;  Service: Cardiovascular: 95% ramus intermedius;  Resolute Onyx 2.75 x 18 mm drug-eluting stent   CYSTOSCOPY/RETROGRADE/URETEROSCOPY/STONE EXTRACTION WITH BASKET  4132,4401   ureteral stone    MENISCUS REPAIR Left    knee open meniscetomy   TRANSTHORACIC ECHOCARDIOGRAM  2004   no lvh nl ejection fraction   TRANSTHORACIC ECHOCARDIOGRAM  09/19/2016   In setting of NSTEMI:  EF 45-50% with diffuse hypokinesis. GR 2 DD. Mild biatrial enlargement.   Family History  Problem Relation Age of Onset   Liver disease Mother    Social History   Socioeconomic History   Marital status: Widowed    Spouse name: Not on file   Number of children: Not on file   Years of education: Not on file   Highest education level: Not on file  Occupational History   Occupation: teacher  Tobacco Use   Smoking status: Former    Current packs/day: 0.00    Average packs/day: 1 pack/day for 13.0 years (13.0 ttl pk-yrs)    Types: Cigarettes    Start date: 07/13/1964    Quit date: 07/13/1977    Years since quitting: 46.2   Smokeless tobacco: Never  Vaping Use   Vaping status: Never Used  Substance and Sexual Activity   Alcohol use: Yes    Alcohol/week: 0.0 standard  drinks of alcohol    Comment: holidays only   Drug use: No   Sexual activity: Not on file  Other Topics Concern   Not on file  Social History Narrative   Widowed 02/2020 after over 40 years married.  He has 3 children and 5 grandchildren. They also 5 great grandchildren.       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   Social Drivers of Health   Financial Resource Strain: Low Risk  (09/16/2023)   Overall Financial Resource Strain (CARDIA)    Difficulty of Paying Living Expenses: Not hard at all  Food Insecurity: No Food Insecurity (09/16/2023)   Hunger Vital Sign    Worried About Running Out of Food in the Last Year: Never true    Ran Out of Food in the Last Year:  Never true  Transportation Needs: No Transportation Needs (09/16/2023)   PRAPARE - Administrator, Civil Service (Medical): No    Lack of Transportation (Non-Medical): No  Physical Activity: Insufficiently Active (09/16/2023)   Exercise Vital Sign    Days of Exercise per Week: 2 days    Minutes of Exercise per Session: 10 min  Stress: No Stress Concern Present (09/16/2023)   [employer] of Occupational Health - Occupational Stress Questionnaire    Feeling of Stress : Not at all  Social Connections: Moderately Integrated (09/16/2023)   Social Connection and Isolation Panel [NHANES]    Frequency of Communication with Friends and Family: Three times a week    Frequency of Social Gatherings with Friends and Family: Three times a week    Attends Religious Services: More than 4 times per year    Active Member of Clubs or Organizations: Yes    Attends Banker Meetings: 1 to 4 times per year    Marital Status: Widowed    Tobacco Counseling Counseling given: Not Answered   Clinical Intake:  Pre-visit preparation completed: Yes  Pain : No/denies pain     BMI - recorded: 52.76 Nutritional Status: BMI > 30  Obese Nutritional Risks: None Diabetes:  Yes CBG done?: No Did pt. bring in CBG monitor from home?: No  How often do you need to have someone help you when you read instructions, pamphlets, or other written materials from your doctor or pharmacy?: 1 - Never  Interpreter Needed?: No  Information entered by :: Lanier Ensign, LPN   Activities of Daily Living    09/16/2023    2:35 PM  In your present state of health, do you have any difficulty performing the following activities:  Hearing? 0  Vision? 0  Difficulty concentrating or making decisions? 0  Walking or climbing stairs? 0  Comment uses ramp  Dressing or bathing? 1  Comment bird bathe  Doing errands, shopping? 0  Preparing Food and eating ? Y  Comment assistance  Using the Toilet? N  In the past six months, have you accidently leaked urine? N  Do you have problems with loss of bowel control? N  Managing your Medications? N  Managing your Finances? N  Housekeeping or managing your Housekeeping? Y  Comment assistance    Patient Care Team: Shelva Majestic, MD as PCP - General (Family Medicine) Marykay Lex, MD as PCP - Cardiology (Cardiology) Casper Harrison. Quillian Quince, MD (Cardiology) Barron Alvine, MD (Inactive) as Attending Physician (Urology) Ranelle Oyster, MD as Consulting Physician (Physical Medicine and Rehabilitation) Marykay Lex, MD as Consulting Physician (Cardiology) Pa, [facility] as Consulting Physician (Ophthalmology)  Indicate any recent Medical Services you may have received from other than [facility] providers in the past year (date may be approximate).     Assessment:   This is a routine wellness examination for Franklin Hicks.  Hearing/Vision screen Hearing Screening - Comments:: Pt denies any hearing issues  Vision Screening - Comments:: Pt follows up with [place] ophthal[facility] for annual eye exams    Goals Addressed             This Visit's Progress    Patient Stated       Lose weight        Depression  Screen    09/16/2023    2:33 PM 07/17/2023   11:18 AM 07/09/2022    3:19 PM 07/07/2022   10:10 AM 07/01/2022    3:14 PM  06/27/2021    2:45 PM 06/20/2021   10:21 AM  PHQ 2/9 Scores  PHQ - 2 Score 0 0 0 0 0 0 0  PHQ- 9 Score     0      Fall Risk    09/16/2023    2:34 PM 07/17/2023   11:18 AM 07/09/2022    3:19 PM 07/07/2022   10:12 AM 07/01/2022    3:11 PM  Fall Risk   Falls in the past year? 0 0 0 0 0  Number falls in past yr: 0 0  0 0  Injury with Fall? 0 0  0 0  Risk for fall due to : Impaired balance/gait;Impaired mobility Impaired balance/gait  Impaired vision History of fall(s)  Follow up Falls prevention discussed Falls evaluation completed  Falls prevention discussed Falls evaluation completed    MEDICARE RISK AT HOME: Medicare Risk at Home Any stairs in or around the home?: Yes If so, are there any without handrails?: No Home free of loose throw rugs in walkways, pet beds, electrical cords, etc?: Yes Adequate lighting in your home to reduce risk of falls?: Yes Life alert?: No Use of a cane, walker or w/c?: Yes Grab bars in the bathroom?: Yes Shower chair or bench in shower?: Yes Elevated toilet seat or a handicapped toilet?: Yes  TIMED UP AND GO:  Was the test performed?  No    Cognitive Function:    07/01/2018    3:49 PM  MMSE - Mini Mental State Exam  Not completed: --        09/16/2023    2:37 PM 07/07/2022   10:13 AM 06/20/2021   10:24 AM 12/22/2019    2:46 PM  6CIT Screen  What Year? 0 points 0 points 0 points 0 points  What month? 0 points 0 points 0 points 0 points  What time? 0 points 0 points 0 points 0 points  Count back from 20 0 points 0 points 0 points 0 points  Months in reverse 0 points 0 points 0 points 0 points  Repeat phrase 0 points 0 points 0 points 0 points  Total Score 0 points 0 points 0 points 0 points    Immunizations Immunization History  Administered Date(s) Administered   Fluad Quad(high Dose 65+) 07/01/2022   Fluad  Trivalent(High Dose 65+) 07/30/2023   Influenza Split 09/15/2011, 07/15/2012   Influenza Whole 06/25/1998, 05/31/2007, 07/17/2010   Influenza, High Dose Seasonal PF 09/24/2017   Influenza,inj,Quad PF,6+ Mos 07/14/2013, 07/30/2015   PFIZER Comirnaty(Gray Top)Covid-19 Tri-Sucrose Vaccine 09/27/2020, 02/28/2021   PFIZER(Purple Top)SARS-COV-2 Vaccination 01/12/2020, 02/03/2020   Td 08/26/1999, 12/08/2008    TDAP status: Due, Education has been provided regarding the importance of this vaccine. Advised may receive this vaccine at local pharmacy or Health Dept. Aware to provide a copy of the vaccination record if obtained from local pharmacy or Health Dept. Verbalized acceptance and understanding.  Flu Vaccine status: Up to date  Pneumococcal vaccine status: Due, Education has been provided regarding the importance of this vaccine. Advised may receive this vaccine at local pharmacy or Health Dept. Aware to provide a copy of the vaccination record if obtained from local pharmacy or Health Dept. Verbalized acceptance and understanding.  Covid-19 vaccine status: Information provided on how to obtain vaccines.   Qualifies for Shingles Vaccine? Yes   Zostavax completed No   Shingrix Completed?: No.    Education has been provided regarding the importance of this vaccine. Patient has been advised to call insurance  company to determine out of pocket expense if they have not yet received this vaccine. Advised may also receive vaccine at local pharmacy or Health Dept. Verbalized acceptance and understanding.  Screening Tests Health Maintenance  Topic Date Due   COVID-19 Vaccine (5 - 2024-25 season) 04/26/2023   Zoster Vaccines- Shingrix (1 of 2) 10/28/2023 (Originally 06/28/1999)   Colonoscopy  12/22/2023 (Originally 06/27/1994)   DTaP/Tdap/Td (3 - Tdap) 07/29/2024 (Originally 12/09/2018)   Pneumonia Vaccine 42+ Years old (1 of 2 - PCV) 07/29/2024 (Originally 06/28/1957)   FOOT EXAM  10/21/2023    OPHTHALMOLOGY EXAM  12/02/2023   HEMOGLOBIN A1C  01/28/2024   Diabetic kidney evaluation - eGFR measurement  07/29/2024   Diabetic kidney evaluation - Urine ACR  07/29/2024   Medicare Annual Wellness (AWV)  09/15/2024   INFLUENZA VACCINE  Completed   Hepatitis C Screening  Completed   HPV VACCINES  Aged Out    Health Maintenance  Health Maintenance Due  Topic Date Due   COVID-19 Vaccine (5 - 2024-25 season) 04/26/2023    Pt declined on colonoscopy or cologuard at this time    Additional Screening:  Hepatitis C Screening Completed 07/30/15     Vision Screening: Recommended annual ophthalmology exams for early detection of glaucoma and other disorders of the eye. Is the patient up to date with their annual eye exam?  Yes  Who is the provider or what is the name of the office in which the patient attends annual eye exams? [facility] ophthalmology  If pt is not established with a provider, would they like to be referred to a provider to establish care? No .   Dental Screening: Recommended annual dental exams for proper oral hygiene  Diabetic Foot Exam: Diabetic Foot Exam: Completed 10/20/22  Community Resource Referral / Chronic Care Management: CRR required this visit?  No   CCM required this visit?  No     Plan:     I have personally reviewed and noted the following in the patient's chart:   Medical and social history Use of alcohol, tobacco or illicit drugs  Current medications and supplements including opioid prescriptions. Patient is not currently taking opioid prescriptions. Functional ability and status Nutritional status Physical activity Advanced directives List of other physicians Hospitalizations, surgeries, and ER visits in previous 12 months Vitals Screenings to include cognitive, depression, and falls Referrals and appointments  In addition, I have reviewed and discussed with patient certain preventive protocols, quality metrics, and best practice  recommendations. A written personalized care plan for preventive services as well as general preventive health recommendations were provided to patient.     Marzella Schlein, LPN   1/61/0960   After Visit Summary: (MyChart) Due to this being a telephonic visit, the after visit summary with patients personalized plan was offered to patient via MyChart   Nurse Notes: none

## 2023-09-16 NOTE — Patient Instructions (Signed)
[name] , Thank you for taking time to come for your Medicare Wellness Visit. I appreciate your ongoing commitment to your health goals. Please review the following plan we discussed and let me know if I can assist you in the future.   Referrals/Orders/Follow-Ups/Clinician Recommendations: Each day, aim for 6 glasses of water, plenty of protein in your diet and try to get up and walk/ stretch every hour for 5-10 minutes at a time.    This is a list of the screening recommended for you and due dates:  Health Maintenance  Topic Date Due   COVID-19 Vaccine (5 - 2024-25 season) 04/26/2023   Zoster (Shingles) Vaccine (1 of 2) 10/28/2023*   Colon Cancer Screening  12/22/2023*   DTaP/Tdap/Td vaccine (3 - Tdap) 07/29/2024*   Pneumonia Vaccine (1 of 2 - PCV) 07/29/2024*   Complete foot exam   10/21/2023   Eye exam for diabetics  12/02/2023   Hemoglobin A1C  01/28/2024   Yearly kidney function blood test for diabetes  07/29/2024   Yearly kidney health urinalysis for diabetes  07/29/2024   Medicare Annual Wellness Visit  09/15/2024   Flu Shot  Completed   Hepatitis C Screening  Completed   HPV Vaccine  Aged Out  *Topic was postponed. The date shown is not the original due date.    Advanced directives: (Declined) Advance directive discussed with you today. Even though you declined this today, please call our office should you change your mind, and we can give you the proper paperwork for you to fill out.  Next Medicare Annual Wellness Visit scheduled for next year: Yes

## 2023-09-17 ENCOUNTER — Telehealth: Payer: Self-pay | Admitting: Family Medicine

## 2023-09-17 DIAGNOSIS — M545 Low back pain, unspecified: Secondary | ICD-10-CM

## 2023-09-17 NOTE — Telephone Encounter (Unsigned)
Copied from CRM [id number]. Topic: Referral - Request for Referral >> Sep 17, 2023 11:47 AM Marica Otter wrote: Did the patient discuss referral with their provider in the last year? Yes (If No - schedule appointment) (If Yes - send message)  Appointment offered? No  Type of order/referral and detailed reason for visit: Orthopedic  Preference of office, provider, location: N/A  If referral order, have you been seen by this specialty before? No (If Yes, this issue or another issue? When? Where?  Can we respond through MyChart? Yes, but prefers a call

## 2023-09-17 NOTE — Telephone Encounter (Signed)
Referral has been placed. 

## 2023-09-22 ENCOUNTER — Ambulatory Visit: Payer: Medicare PPO | Admitting: Orthopaedic Surgery

## 2023-09-23 ENCOUNTER — Ambulatory Visit: Payer: Medicare PPO | Admitting: Physical Therapy

## 2023-09-24 DIAGNOSIS — L853 Xerosis cutis: Secondary | ICD-10-CM | POA: Diagnosis not present

## 2023-09-29 ENCOUNTER — Ambulatory Visit: Payer: Medicare PPO | Admitting: Orthopaedic Surgery

## 2023-09-29 ENCOUNTER — Encounter: Payer: Self-pay | Admitting: Orthopaedic Surgery

## 2023-09-29 ENCOUNTER — Other Ambulatory Visit (INDEPENDENT_AMBULATORY_CARE_PROVIDER_SITE_OTHER): Payer: Self-pay

## 2023-09-29 VITALS — BP 129/96 | HR 82 | Ht 72.0 in | Wt 389.0 lb

## 2023-09-29 DIAGNOSIS — G8929 Other chronic pain: Secondary | ICD-10-CM

## 2023-09-29 DIAGNOSIS — M545 Low back pain, unspecified: Secondary | ICD-10-CM

## 2023-09-29 DIAGNOSIS — I2699 Other pulmonary embolism without acute cor pulmonale: Secondary | ICD-10-CM | POA: Diagnosis not present

## 2023-09-29 NOTE — Progress Notes (Signed)
 Office Visit Note   Patient: Franklin Hicks           Date of Birth: [birth year]           MRN: [id number] Visit Date: 09/29/2023              Requested by: Katrinka Garnette KIDD, MD [address] [place],  [place] [address] PCP: Katrinka Garnette KIDD, MD   Assessment & Plan: Visit Diagnoses:  1. Chronic bilateral low back pain, unspecified whether sciatica present   2. Recurrent pulmonary embolism (HCC)     Plan: Discussed with patient that options would be lumbar MRI imaging however with his knee arthritis BMI 52 other medical problems, his ability to ambulate in the feet did have some degree of stenosis would not likely improve due to his other medical problems.  We discussed working on some weight loss.  Using his lift chair voiding low chairs always using chairs with arms fall prevention and always using his walker.  Follow-Up Instructions: No follow-ups on file.   Orders:  Orders Placed This Encounter  Procedures   XR Lumbar Spine 2-3 Views   No orders of the defined types were placed in this encounter.     Procedures: No procedures performed   Clinical Data: No additional findings.   Subjective: Chief Complaint  Patient presents with   Lower Back - Pain    HPI 73 year old male with complaints of left side pain of thoracolumbar junction radiates to the mid axillary region.  He states he felt like he has had knots in his stomach on the side.  History of multiple PEs.  Cervical spondylosis with some myelomalacia changes at C4.  BMI 52 he cannot walk without using his walker and he is in a wheelchair today which is his.  He states his legs give way.  Knee stiffness with knee arthritis he has had some knee injections many years ago.  He has worked with a paramedic and they were concerned that he might have some lower thoracic spondylosis.  Minimal household ambulator with a walker.  He states in the past he could hang onto a cart and go further.  Patient has idiopathic  peripheral neuropathy.  Review of Systems positive history for recurrent pulmonary emboli morbid obesity, hypertension type 2 diabetes, gout.   Objective: Vital Signs: BP (!) 129/96   Pulse 82   Ht 6' (1.829 m)   Wt (!) 389 lb (176.4 kg)   BMI 52.76 kg/m   Physical Exam Constitutional:      Comments: Morbidly obese in wheelchair leaning to the right side.  HENT:     Right Ear: External ear normal.     Left Ear: External ear normal.  Eyes:     Extraocular Movements: Extraocular movements intact.  Cardiovascular:     Rate and Rhythm: Normal rate.  Pulmonary:     Effort: No respiratory distress.     Ortho Exam patient has no lower extremity hyperreflexia minimal left knee range of motion with crepitus he cannot flex to 90 without discomfort.  Bilateral lower extremity edema right and left and compression stockings.  Specialty Comments:  No specialty comments available.  Imaging: No results found.   PMFS History: Patient Active Problem List   Diagnosis Date Noted   Cholelithiases 08/09/2023   Fatty liver 08/09/2023   Peripheral arterial disease (HCC) 08/09/2023   Prostatic enlargement 08/09/2023   DDD (degenerative disc disease), lumbar 08/09/2023   Chronic kidney disease, stage 3a (  HCC) 07/17/2023   Preoperative cardiovascular examination 02/05/2021   Recurrent pulmonary embolism (HCC)    Pain of left heel    C4 spinal cord injury, sequela (HCC) 12/18/2017   Tetraplegia (HCC) 12/18/2017   Aortic aneurysm (HCC) 12/16/2017   Cord compression (HCC) 12/13/2017   Chronic combined systolic and diastolic heart failure (HCC) 10/09/2016   CAD S/P percutaneous coronary angioplasty 09/20/2016   Primary osteoarthritis of knees, bilateral 01/11/2016   Erectile dysfunction 07/30/2015   Morbid obesity (HCC) 07/18/2013   Angioedema of lips 07/13/2013   Multinodular goiter 07/13/2013   Hyperlipidemia associated with type 2 diabetes mellitus (HCC) 10/02/2011   SOB (shortness  of breath) 10/02/2011   BPH with urinary obstruction 07/17/2010   Diabetes mellitus type 2, controlled (HCC) 07/17/2010   Bilateral lower extremity edema 12/08/2008   Edema 12/08/2008   NEUROPATHY, IDIOPATHIC PERIPHERAL NEC 05/31/2007   Osteoarthritis 03/22/2007   Gout 03/15/2007   Essential hypertension 03/15/2007   Past Medical History:  Diagnosis Date   Arthritis    knees (12/17/2017)   CAD S/P percutaneous coronary angioplasty 09/20/2016   Nstemi 08/2016. Dr. Claudene. DES- brilinta  and asa. Requests change to [facility] cardiology; 95% Ramus -> PCI Resolute Onyx DES 2.75 x 18   Central cord syndrome (HCC) 12/17/2017   Chronic combined systolic and diastolic heart failure (HCC) 10/09/2016   EF 45% and grade II diastolic after nstemi   Diet-controlled diabetes mellitus (HCC)    DJD (degenerative joint disease)    Gout    High cholesterol    Hypertension    NSTEMI (non-ST elevated myocardial infarction) (HCC) 09/20/2016   95% Ramus - > PCI    Obesity    Recurrent pulmonary embolism (HCC) 11/'14; 4/'19   a) Bilateral segmental and subsegmental pulmonary emboli with mild RV Strain.; b) after fall with C-spine Fxr --> Acute PE of right main pulmonary artery extending into multiple segments.    Family History  Problem Relation Age of Onset   Liver disease Mother     Past Surgical History:  Procedure Laterality Date   CARDIAC CATHETERIZATION N/A 09/19/2016   Procedure: Left Heart Cath and Coronary Angiography;  Surgeon: Salena Claudene, MD;  Location: [facility] INVASIVE CV LAB;  Service: Cardiovascular: 95% proximal Ramus Intermedius --> PCI   CARDIAC CATHETERIZATION N/A 09/19/2016   Procedure: Coronary Stent Intervention;  Surgeon: Lonni Hanson, MD;  Location: [facility] INVASIVE CV LAB;  Service: Cardiovascular: 95% ramus intermedius;  Resolute Onyx 2.75 x 18 mm drug-eluting stent   CYSTOSCOPY/RETROGRADE/URETEROSCOPY/STONE EXTRACTION WITH BASKET  7992,8000   ureteral stone    MENISCUS REPAIR Left     knee open meniscetomy   TRANSTHORACIC ECHOCARDIOGRAM  2004   no lvh nl ejection fraction   TRANSTHORACIC ECHOCARDIOGRAM  09/19/2016   In setting of NSTEMI:  EF 45-50% with diffuse hypokinesis. GR 2 DD. Mild biatrial enlargement.   Social History   Occupational History   Occupation: runner, broadcasting/film/video  Tobacco Use   Smoking status: Former    Current packs/day: 0.00    Average packs/day: 1 pack/day for 13.0 years (13.0 ttl pk-yrs)    Types: Cigarettes    Start date: 07/13/1964    Quit date: 07/13/1977    Years since quitting: 46.2   Smokeless tobacco: Never  Vaping Use   Vaping status: Never Used  Substance and Sexual Activity   Alcohol use: Yes    Alcohol/week: 0.0 standard drinks of alcohol    Comment: holidays only   Drug use: No   Sexual activity:  Not on file

## 2023-10-05 ENCOUNTER — Encounter: Payer: Self-pay | Admitting: Family Medicine

## 2023-10-05 DIAGNOSIS — M545 Low back pain, unspecified: Secondary | ICD-10-CM

## 2023-10-06 ENCOUNTER — Encounter: Payer: Medicare PPO | Admitting: Physical Therapy

## 2023-10-19 NOTE — Progress Notes (Unsigned)
    Aleen Sells D.[facility] [address] [place] [address] Phone: [phone]   Assessment and Plan:     There are no diagnoses linked to this encounter.  ***   Pertinent previous records reviewed include ***    Follow Up: ***     Subjective:   I, Franklin Hicks, am serving as a Neurosurgeon for Doctor Richardean Sale  Chief Complaint: low back pain   HPI:   10/20/2023 Patient is a 73 year old male with low back pain. Patient states   Relevant Historical Information: ***  Additional pertinent review of systems negative.   Current Outpatient Medications:    acetaminophen (TYLENOL) 650 MG CR tablet, Take 650-1,300 mg by mouth every 8 (eight) hours as needed for pain., Disp: , Rfl:    ammonium lactate (AMLACTIN) 12 % lotion, Apply 1 application topically as needed for dry skin., Disp: 400 g, Rfl: 0   aspirin EC 81 MG tablet, Take 1 tablet (81 mg total) by mouth daily. (Patient taking differently: Take 81 mg by mouth in the morning.), Disp: 90 tablet, Rfl: 3   atorvastatin (LIPITOR) 80 MG tablet, TAKE 1 TABLET BY MOUTH DAILY AT 6 PM., Disp: 90 tablet, Rfl: 3   Chromium Picolinate (CHROMIUM PICOLATE PO), Take 1 tablet by mouth 3 (three) times a week., Disp: , Rfl:    clotrimazole-betamethasone (LOTRISONE) cream, Apply to both lower extremities twice daily., Disp: 45 g, Rfl: 3   diclofenac sodium (VOLTAREN) 1 % GEL, Apply 1 application topically 3 (three) times daily. Both knees, Disp: 3 Tube, Rfl: 4   fluticasone (FLONASE) 50 MCG/ACT nasal spray, SPRAY 2 SPRAYS INTO EACH NOSTRIL EVERY DAY, Disp: 48 mL, Rfl: 1   metoprolol tartrate (LOPRESSOR) 50 MG tablet, Take 1 tablet (50 mg total) by mouth 2 (two) times daily., Disp: 180 tablet, Rfl: 3   Multiple Vitamin (MULTIVITAMIN WITH MINERALS) TABS tablet, Take 1 tablet by mouth daily., Disp: , Rfl:    Potassium Citrate 15 MEQ (1620 MG) TBCR, Take 1 tablet by mouth 2 (two) times daily., Disp: ,  Rfl: 11   rivaroxaban (XARELTO) 20 MG TABS tablet, Take 1 tablet (20 mg total) by mouth daily., Disp: 30 tablet, Rfl: 11   terazosin (HYTRIN) 10 MG capsule, TAKE 1 CAPSULE BY MOUTH EVERYDAY AT BEDTIME, Disp: 90 capsule, Rfl: 3   tizanidine (ZANAFLEX) 2 MG capsule, Take 1 capsule (2 mg total) by mouth 3 (three) times daily as needed for muscle spasms., Disp: 30 capsule, Rfl: 5   vitamin C (ASCORBIC ACID) 500 MG tablet, Take 500 mg by mouth daily in the afternoon., Disp: , Rfl:    Objective:     There were no vitals filed for this visit.    There is no height or weight on file to calculate BMI.    Physical Exam:    ***   Electronically signed by:  Aleen Sells D.[facility] 12:33 PM 10/19/23

## 2023-10-20 ENCOUNTER — Ambulatory Visit: Payer: Medicare PPO | Admitting: Sports Medicine

## 2023-10-20 ENCOUNTER — Ambulatory Visit (INDEPENDENT_AMBULATORY_CARE_PROVIDER_SITE_OTHER): Payer: Medicare PPO

## 2023-10-20 VITALS — HR 77 | Ht 72.0 in | Wt 389.0 lb

## 2023-10-20 DIAGNOSIS — M5134 Other intervertebral disc degeneration, thoracic region: Secondary | ICD-10-CM

## 2023-10-20 DIAGNOSIS — M545 Low back pain, unspecified: Secondary | ICD-10-CM | POA: Diagnosis not present

## 2023-10-20 DIAGNOSIS — G8929 Other chronic pain: Secondary | ICD-10-CM

## 2023-10-20 DIAGNOSIS — M51362 Other intervertebral disc degeneration, lumbar region with discogenic back pain and lower extremity pain: Secondary | ICD-10-CM | POA: Diagnosis not present

## 2023-10-20 DIAGNOSIS — M549 Dorsalgia, unspecified: Secondary | ICD-10-CM | POA: Diagnosis not present

## 2023-10-20 MED ORDER — GABAPENTIN 100 MG PO CAPS: 100.0000 mg | ORAL_CAPSULE | Freq: Two times a day (BID) | ORAL | 0 refills | Status: DC

## 2023-10-20 NOTE — Patient Instructions (Signed)
 Tylenol [phone] mg 2-3 times a day for pain relief  Gabapentin 100 mg 2x a day  Can use muscle relaxer's as needed  Low back HEP  Lumbar and thoracic MRI  Xrays on the way out  Follow up 5 days after to discuss results

## 2023-10-26 ENCOUNTER — Ambulatory Visit: Payer: Medicare PPO | Admitting: Dermatology

## 2023-10-26 ENCOUNTER — Encounter: Payer: Self-pay | Admitting: Dermatology

## 2023-10-26 DIAGNOSIS — L819 Disorder of pigmentation, unspecified: Secondary | ICD-10-CM

## 2023-10-26 DIAGNOSIS — R600 Localized edema: Secondary | ICD-10-CM

## 2023-10-26 DIAGNOSIS — L859 Epidermal thickening, unspecified: Secondary | ICD-10-CM

## 2023-10-26 DIAGNOSIS — I890 Lymphedema, not elsewhere classified: Secondary | ICD-10-CM

## 2023-10-26 DIAGNOSIS — Z79899 Other long term (current) drug therapy: Secondary | ICD-10-CM

## 2023-10-26 DIAGNOSIS — I872 Venous insufficiency (chronic) (peripheral): Secondary | ICD-10-CM

## 2023-10-26 DIAGNOSIS — Z7189 Other specified counseling: Secondary | ICD-10-CM

## 2023-10-26 MED ORDER — VASCULERA PO TABS: ORAL_TABLET | ORAL | 2 refills | Status: DC

## 2023-10-26 MED ORDER — TACROLIMUS 0.1 % EX OINT: TOPICAL_OINTMENT | CUTANEOUS | 2 refills | Status: DC

## 2023-10-26 NOTE — Patient Instructions (Addendum)
 Start Vasculera diosmiplex 630 mg tablets take 1 tablet daily- samples given  Start Tacrolimus cream apply to legs daily  Continue Urea 40% cream apply to legs and feet daily  Continue compression stocking daily     Due to recent changes in healthcare laws, you may see results of your pathology and/or laboratory studies on MyChart before the doctors have had a chance to review them. We understand that in some cases there may be results that are confusing or concerning to you. Please understand that not all results are received at the same time and often the doctors may need to interpret multiple results in order to provide you with the best plan of care or course of treatment. Therefore, we ask that you please give [place] 2 business days to thoroughly review all your results before contacting the office for clarification. Should we see a critical lab result, you will be contacted sooner.   If You Need Anything After Your Visit  If you have any questions or concerns for your doctor, please call our main line at [phone] and press option 4 to reach your doctor's medical assistant. If no one answers, please leave a voicemail as directed and we will return your call as soon as possible. Messages left after 4 pm will be answered the following business day.   You may also send [place] a message via MyChart. We typically respond to MyChart messages within 1-2 business days.  For prescription refills, please ask your pharmacy to contact our office. Our fax number is [phone].  If you have an urgent issue when the clinic is closed that cannot wait until the next business day, you can page your doctor at the number below.    Please note that while we do our best to be available for urgent issues outside of office hours, we are not available 24/7.   If you have an urgent issue and are unable to reach [place], you may choose to seek medical care at your doctor's office, retail clinic, urgent care center, or  emergency room.  If you have a medical emergency, please immediately call 911 or go to the emergency department.  Pager Numbers  - Dr. Gwen Pounds: [phone]  - Dr. Roseanne Reno: [phone]  - Dr. Katrinka Blazing: [phone]   In the event of inclement weather, please call our main line at [phone] for an update on the status of any delays or closures.  Dermatology Medication Tips: Please keep the boxes that topical medications come in in order to help keep track of the instructions about where and how to use these. Pharmacies typically print the medication instructions only on the boxes and not directly on the medication tubes.   If your medication is too expensive, please contact our office at [phone] option 4 or send [place] a message through MyChart.   We are unable to tell what your co-pay for medications will be in advance as this is different depending on your insurance coverage. However, we may be able to find a substitute medication at lower cost or fill out paperwork to get insurance to cover a needed medication.   If a prior authorization is required to get your medication covered by your insurance company, please allow [place] 1-2 business days to complete this process.  Drug prices often vary depending on where the prescription is filled and some pharmacies may offer cheaper prices.  The website www.goodrx.com contains coupons for medications through different pharmacies. The prices here do not account for what the  cost may be with help from insurance (it may be cheaper with your insurance), but the website can give you the price if you did not use any insurance.  - You can print the associated coupon and take it with your prescription to the pharmacy.  - You may also stop by our office during regular business hours and pick up a GoodRx coupon card.  - If you need your prescription sent electronically to a different pharmacy, notify our office through [facility] or by phone at  [phone] option 4.     Si Usted Necesita Algo Despus de Su Visita  Tambin puede enviarnos un mensaje a travs de Clinical cytogeneticist. Por lo general respondemos a los mensajes de MyChart en el transcurso de 1 a 2 das hbiles.  Para renovar recetas, por favor pida a su farmacia que se ponga en contacto con nuestra oficina. [place] de fax es [place] [phone].  Si tiene un asunto urgente cuando la clnica est cerrada y que no puede esperar hasta el siguiente da hbil, puede llamar/localizar a su doctor(a) al nmero que aparece a continuacin.   Por favor, tenga en cuenta que aunque hacemos todo lo posible para estar disponibles para asuntos urgentes fuera del horario de [place], no estamos disponibles las 24 horas del da, los 7 [address]  [address] de la [place].   Si tiene un problema urgente y no puede comunicarse con nosotros, puede optar por buscar atencin mdica  en el consultorio de su doctor(a), en una clnica privada, en un centro de atencin urgente o en una sala de emergencias.  Si tiene Engineer, drilling, por favor llame inmediatamente al 911 o vaya a la sala de emergencias.  Nmeros de bper  - Dr. Gwen Pounds: [phone]  - Dra. Roseanne Reno: [phone]  - Dr. Katrinka Blazing: [phone]   En caso de inclemencias del tiempo, por favor llame a Lacy Duverney principal al [phone] para una actualizacin sobre el [place] de cualquier retraso o cierre.  Consejos para la medicacin en dermatologa: Por favor, guarde las cajas en las que vienen los medicamentos de uso tpico para ayudarle a seguir las instrucciones sobre dnde y cmo usarlos. Las farmacias generalmente imprimen las instrucciones del medicamento slo en las cajas y no directamente en los tubos del [place].   Si su medicamento es muy caro, por favor, pngase en contacto con Rolm Gala llamando al [phone] y presione la opcin 4 o envenos un mensaje a travs de Clinical cytogeneticist.   No podemos decirle cul ser su copago por los  medicamentos por adelantado ya que esto es diferente dependiendo de la cobertura de su seguro. Sin embargo, es posible que podamos encontrar un medicamento sustituto a Audiological scientist un formulario para que el seguro cubra el medicamento que se considera necesario.   Si se requiere una autorizacin previa para que su compaa de seguros [place] su medicamento, por favor permtanos de 1 a 2 das hbiles para completar [address]  Los precios de los medicamentos varan con frecuencia dependiendo del Environmental consultant de dnde se surte la receta y alguna farmacias pueden ofrecer precios ms baratos.  El sitio web www.goodrx.com tiene cupones para medicamentos de Health and safety inspector. Los precios aqu no tienen en cuenta lo que podra costar con la ayuda del seguro (puede ser ms barato con su seguro), pero el sitio web puede darle el precio si no utiliz Tourist information centre manager.  - Puede imprimir el cupn correspondiente y llevarlo con su receta a la farmacia.  - Tambin puede pasar  por nuestra oficina durante el horario de atencin regular y Education officer, museum una tarjeta de cupones de GoodRx.  - Si necesita que su receta se enve electrnicamente a una farmacia diferente, informe a nuestra oficina a travs de MyChart de [place] o por telfono llamando al [phone] y presione la opcin 4.

## 2023-10-26 NOTE — Progress Notes (Signed)
   New Patient Visit   Subjective  Franklin Hicks is a 73 y.o. male who presents for the following: Patient c/o discoloration, swelling and texture changing on his lower for ~10 years, using Clotrimazole-betamethasone cream and Urea cream on his legs with a poor response.  The patient has spots, moles and lesions to be evaluated, some may be new or changing and the patient may have concern these could be cancer.  The following portions of the chart were reviewed this encounter and updated as appropriate: medications, allergies, medical history  Review of Systems:  No other skin or systemic complaints except as noted in HPI or Assessment and Plan.  Objective  Well appearing patient in no apparent distress; mood and affect are within normal limits.  A focused examination was performed of the following areas:legs   Relevant exam findings are noted in the Assessment and Plan.       Assessment & Plan   STASIS DERMATITIS with Hyperkeratosis and  Elephantiasis Nostra With Edema and Dyschromia Exam: Erythematous, scaly patches involving the ankle and distal lower leg with associated lower leg edema. Chronic and persistent condition with duration or expected duration over one year. Condition is bothersome/symptomatic for patient. Currently flared.  See photos Stasis in the legs causes chronic leg swelling, which may result in itchy or painful rashes, skin discoloration, skin texture changes, and sometimes ulceration.  Recommend daily graduated compression hose/stockings- easiest to put on first thing in morning, remove at bedtime.  Elevate legs as much as possible. Avoid salt/sodium rich foods.  Treatment Plan: Continue Compression stocking daily  Continue Urea 40% cream every day to debride scale Start Tacrolimus cream apply to legs daily to decrease inflammation. Start Vasculera diosmiplex 630 mg tablet take 1 tablet a day - (4 sample packets given 30 day supply)  Lot [id number]  Exp  05/2025  We will send a referral to the Lymphedema clinic   Advised the patient that his condition is not curable but treatable and we can improve it. He understands. DERMATITIS, STASIS   Related Procedures Ambulatory referral to Physical Therapy  Return in about 4 months (around 02/25/2024) for stasis dermatitis .  IAngelique Holm, CMA, am acting as scribe for Armida Sans, MD .   Documentation: I have reviewed the above documentation for accuracy and completeness, and I agree with the above.  Armida Sans, MD

## 2023-10-29 ENCOUNTER — Ambulatory Visit: Payer: Medicare PPO | Admitting: Dermatology

## 2023-11-09 ENCOUNTER — Encounter: Payer: Self-pay | Admitting: Sports Medicine

## 2023-11-11 ENCOUNTER — Other Ambulatory Visit: Payer: Self-pay | Admitting: Family Medicine

## 2023-11-18 ENCOUNTER — Other Ambulatory Visit: Payer: Self-pay | Admitting: Family Medicine

## 2023-11-18 ENCOUNTER — Other Ambulatory Visit: Payer: Self-pay | Admitting: Sports Medicine

## 2023-11-18 ENCOUNTER — Ambulatory Visit
Admission: RE | Admit: 2023-11-18 | Discharge: 2023-11-18 | Disposition: A | Discharge status: Home / Self Care | Source: Ambulatory Visit | Attending: Sports Medicine

## 2023-11-18 ENCOUNTER — Ambulatory Visit
Admission: RE | Admit: 2023-11-18 | Discharge: 2023-11-18 | Disposition: A | Discharge status: Home / Self Care | Source: Ambulatory Visit | Attending: Sports Medicine | Admitting: Sports Medicine

## 2023-11-18 DIAGNOSIS — G8929 Other chronic pain: Secondary | ICD-10-CM

## 2023-11-18 DIAGNOSIS — M545 Low back pain, unspecified: Secondary | ICD-10-CM

## 2023-11-18 DIAGNOSIS — M51362 Other intervertebral disc degeneration, lumbar region with discogenic back pain and lower extremity pain: Secondary | ICD-10-CM

## 2023-11-18 DIAGNOSIS — M5134 Other intervertebral disc degeneration, thoracic region: Secondary | ICD-10-CM

## 2023-12-01 ENCOUNTER — Ambulatory Visit: Payer: Medicare PPO | Admitting: Family Medicine

## 2023-12-01 ENCOUNTER — Encounter: Payer: Self-pay | Admitting: Family Medicine

## 2023-12-01 VITALS — BP 112/66 | HR 76 | Temp 97.3°F | Ht 72.0 in

## 2023-12-01 DIAGNOSIS — G8250 Quadriplegia, unspecified: Secondary | ICD-10-CM

## 2023-12-01 DIAGNOSIS — I5042 Chronic combined systolic (congestive) and diastolic (congestive) heart failure: Secondary | ICD-10-CM | POA: Diagnosis not present

## 2023-12-01 DIAGNOSIS — I10 Essential (primary) hypertension: Secondary | ICD-10-CM | POA: Diagnosis not present

## 2023-12-01 DIAGNOSIS — E785 Hyperlipidemia, unspecified: Secondary | ICD-10-CM

## 2023-12-01 DIAGNOSIS — I2699 Other pulmonary embolism without acute cor pulmonale: Secondary | ICD-10-CM

## 2023-12-01 DIAGNOSIS — Z7984 Long term (current) use of oral hypoglycemic drugs: Secondary | ICD-10-CM

## 2023-12-01 DIAGNOSIS — E1169 Type 2 diabetes mellitus with other specified complication: Secondary | ICD-10-CM

## 2023-12-01 DIAGNOSIS — E119 Type 2 diabetes mellitus without complications: Secondary | ICD-10-CM

## 2023-12-01 MED ORDER — TIZANIDINE HCL 2 MG PO CAPS: 2.0000 mg | ORAL_CAPSULE | Freq: Three times a day (TID) | ORAL | 5 refills | Status: DC | PRN

## 2023-12-01 NOTE — Patient Instructions (Addendum)
 Please stop by lab before you go If you have mychart- we will send your results within 3 business days of [place] receiving them.  If you do not have mychart- we will call you about results within 5 business days of [place] receiving them.  *please also note that you will see labs on mychart as soon as they post. I will later go in and write notes on them- will say "notes from Dr. Durene Cal"   No changes today unless labs lead [place] to make changes - we are still considering Ozempic or Mounjaro but you wanted back situation to be more settles (unless a1c is up)   TENS UNIT: This is helpful for muscle pain and spasm.   Search and Purchase a TENS 7000 2nd edition at  www.tenspros.com or www.Amazon.com It should be less than $30.     TENS unit instructions: Do not shower or bathe with the unit on Turn the unit off before removing electrodes or batteries If the electrodes lose stickiness add a drop of water to the electrodes after they are disconnected from the unit and place on plastic sheet. If you continued to have difficulty, call the TENS unit company to purchase more electrodes. Do not apply lotion on the skin area prior to use. Make sure the skin is clean and dry as this will help prolong the life of the electrodes. After use, always check skin for unusual red areas, rash or other skin difficulties. If there are any skin problems, does not apply electrodes to the same area. Never remove the electrodes from the unit by pulling the wires. Do not use the TENS unit or electrodes other than as directed. Do not change electrode placement without consultating your therapist or physician. Keep 2 fingers with between each electrode. Wear time ratio is 2:1, on to off times.    For example on for 30 minutes off for 15 minutes and then on for 30 minutes off for 15 minutes  Recommended follow up: Return in about 4 months (around 04/01/2024) for followup or sooner if needed.Schedule b4 you leave.

## 2023-12-01 NOTE — Progress Notes (Unsigned)
 Aleen Sells D.[facility] [address] [place] [address] Phone: [phone]   Assessment and Plan:     There are no diagnoses linked to this encounter.  ***   Pertinent previous records reviewed include ***    Follow Up: ***     Subjective:   I, Evian Derringer, am serving as a Neurosurgeon for Doctor Richardean Sale   Chief Complaint: low back pain    HPI:    10/20/2023 Patient is a 73 year old male with low back pain. Patient states he is having spasm . Pain is the worst when he wakes in the am, his stomach and back are in knots. He uses relaxer's and tylenol the pain eases up a little . The pain was radiating to the side and down his body when he would sit up straight that pain is constant. If he makes sudden movements he will have spasm. He will have intense spasm when in the bed now . Pain radiates to the top of his head and stomach. He was in PT and that helped. Discontinue because they didn't have a diagnosis    12/02/2023 Patient states   Relevant Historical Information: CHF, history of PE, hypertension, PAD, DM type II, CKD, morbid obesity, chronic anticoagulation on Xarelto  Additional pertinent review of systems negative.   Current Outpatient Medications:    acetaminophen (TYLENOL) 650 MG CR tablet, Take 650-1,300 mg by mouth every 8 (eight) hours as needed for pain., Disp: , Rfl:    ammonium lactate (AMLACTIN) 12 % lotion, Apply 1 application topically as needed for dry skin., Disp: 400 g, Rfl: 0   aspirin EC 81 MG tablet, Take 1 tablet (81 mg total) by mouth daily. (Patient taking differently: Take 81 mg by mouth in the morning.), Disp: 90 tablet, Rfl: 3   atorvastatin (LIPITOR) 80 MG tablet, TAKE 1 TABLET BY MOUTH DAILY AT 6 PM., Disp: 90 tablet, Rfl: 3   Chromium Picolinate (CHROMIUM PICOLATE PO), Take 1 tablet by mouth 3 (three) times a week., Disp: , Rfl:    clotrimazole-betamethasone (LOTRISONE) cream, Apply to both lower  extremities twice daily., Disp: 45 g, Rfl: 3   diclofenac sodium (VOLTAREN) 1 % GEL, Apply 1 application topically 3 (three) times daily. Both knees, Disp: 3 Tube, Rfl: 4   Dietary Management Product (VASCULERA) TABS, Take 1 tablet once a day, Disp: 30 tablet, Rfl: 2   fluticasone (FLONASE) 50 MCG/ACT nasal spray, SPRAY 2 SPRAYS INTO EACH NOSTRIL EVERY DAY, Disp: 48 mL, Rfl: 1   gabapentin (NEURONTIN) 100 MG capsule, TAKE 1 CAPSULE BY MOUTH TWICE A DAY, Disp: 60 capsule, Rfl: 0   metoprolol tartrate (LOPRESSOR) 50 MG tablet, Take 1 tablet (50 mg total) by mouth 2 (two) times daily., Disp: 180 tablet, Rfl: 3   Multiple Vitamin (MULTIVITAMIN WITH MINERALS) TABS tablet, Take 1 tablet by mouth daily., Disp: , Rfl:    Potassium Citrate 15 MEQ (1620 MG) TBCR, Take 1 tablet by mouth 2 (two) times daily., Disp: , Rfl: 11   rivaroxaban (XARELTO) 20 MG TABS tablet, Take 1 tablet (20 mg total) by mouth daily., Disp: 30 tablet, Rfl: 11   tacrolimus (PROTOPIC) 0.1 % ointment, Apply to legs twice a day, Disp: 100 g, Rfl: 2   terazosin (HYTRIN) 10 MG capsule, TAKE 1 CAPSULE BY MOUTH EVERYDAY AT BEDTIME, Disp: 90 capsule, Rfl: 3   tizanidine (ZANAFLEX) 2 MG capsule, Take 1 capsule (2 mg total) by mouth 3 (  three) times daily as needed for muscle spasms., Disp: 30 capsule, Rfl: 5   vitamin C (ASCORBIC ACID) 500 MG tablet, Take 500 mg by mouth daily in the afternoon., Disp: , Rfl:    Objective:     There were no vitals filed for this visit.    There is no height or weight on file to calculate BMI.    Physical Exam:    ***   Electronically signed by:  Aleen Sells D.[facility] 7:42 AM 12/01/23

## 2023-12-01 NOTE — Progress Notes (Signed)
 Phone [phone] In person visit   Subjective:   Franklin Hicks is a 73 y.o. year old very pleasant male patient who presents for/with See problem oriented charting Chief Complaint  Patient presents with   Medical Management of Chronic Issues   Diabetes   Back Pain    Pt states he is still having back spasms.    Past Medical History-  Patient Active Problem List   Diagnosis Date Noted   Recurrent pulmonary embolism (HCC)     Priority: High   C4 spinal cord injury, sequela (HCC) 12/18/2017    Priority: High   Tetraplegia (HCC) 12/18/2017    Priority: High   Aortic aneurysm (HCC) 12/16/2017    Priority: High   Cord compression (HCC) 12/13/2017    Priority: High   Chronic combined systolic and diastolic heart failure (HCC) 10/09/2016    Priority: High   CAD S/P percutaneous coronary angioplasty 09/20/2016    Priority: High   Morbid obesity (HCC) 07/18/2013    Priority: High   Diabetes mellitus type 2, controlled (HCC) 07/17/2010    Priority: High   Bilateral lower extremity edema 12/08/2008    Priority: High   Chronic kidney disease, stage 3a (HCC) 07/17/2023    Priority: Medium    Angioedema of lips 07/13/2013    Priority: Medium    Hyperlipidemia associated with type 2 diabetes mellitus (HCC) 10/02/2011    Priority: Medium    BPH with urinary obstruction 07/17/2010    Priority: Medium    Gout 03/15/2007    Priority: Medium    Essential hypertension 03/15/2007    Priority: Medium    Pain of left heel     Priority: Low   Primary osteoarthritis of knees, bilateral 01/11/2016    Priority: Low   Erectile dysfunction 07/30/2015    Priority: Low   Multinodular goiter 07/13/2013    Priority: Low   SOB (shortness of breath) 10/02/2011    Priority: Low   NEUROPATHY, IDIOPATHIC PERIPHERAL NEC 05/31/2007    Priority: Low   Osteoarthritis 03/22/2007    Priority: Low   Cholelithiases 08/09/2023   Fatty liver 08/09/2023   Peripheral arterial disease (HCC) 08/09/2023    Prostatic enlargement 08/09/2023   DDD (degenerative disc disease), lumbar 08/09/2023   Preoperative cardiovascular examination 02/05/2021   Edema 12/08/2008    Medications- reviewed and updated Current Outpatient Medications  Medication Sig Dispense Refill   acetaminophen (TYLENOL) 650 MG CR tablet Take 650-1,300 mg by mouth every 8 (eight) hours as needed for pain.     ammonium lactate (AMLACTIN) 12 % lotion Apply 1 application topically as needed for dry skin. 400 g 0   aspirin EC 81 MG tablet Take 1 tablet (81 mg total) by mouth daily. (Patient taking differently: Take 81 mg by mouth in the morning.) 90 tablet 3   atorvastatin (LIPITOR) 80 MG tablet TAKE 1 TABLET BY MOUTH DAILY AT 6 PM. 90 tablet 3   Chromium Picolinate (CHROMIUM PICOLATE PO) Take 1 tablet by mouth 3 (three) times a week.     clotrimazole-betamethasone (LOTRISONE) cream Apply to both lower extremities twice daily. 45 g 3   diclofenac sodium (VOLTAREN) 1 % GEL Apply 1 application topically 3 (three) times daily. Both knees 3 Tube 4   Dietary Management Product (VASCULERA) TABS Take 1 tablet once a day 30 tablet 2   fluticasone (FLONASE) 50 MCG/ACT nasal spray SPRAY 2 SPRAYS INTO EACH NOSTRIL EVERY DAY 48 mL 1   gabapentin (NEURONTIN) 100 MG  capsule TAKE 1 CAPSULE BY MOUTH TWICE A DAY 60 capsule 0   metoprolol tartrate (LOPRESSOR) 50 MG tablet Take 1 tablet (50 mg total) by mouth 2 (two) times daily. 180 tablet 3   Multiple Vitamin (MULTIVITAMIN WITH MINERALS) TABS tablet Take 1 tablet by mouth daily.     Potassium Citrate 15 MEQ (1620 MG) TBCR Take 1 tablet by mouth 2 (two) times daily.  11   rivaroxaban (XARELTO) 20 MG TABS tablet Take 1 tablet (20 mg total) by mouth daily. 30 tablet 11   tacrolimus (PROTOPIC) 0.1 % ointment Apply to legs twice a day 100 g 2   terazosin (HYTRIN) 10 MG capsule TAKE 1 CAPSULE BY MOUTH EVERYDAY AT BEDTIME 90 capsule 3   vitamin C (ASCORBIC ACID) 500 MG tablet Take 500 mg by mouth daily  in the afternoon.     tizanidine (ZANAFLEX) 2 MG capsule Take 1 capsule (2 mg total) by mouth 3 (three) times daily as needed for muscle spasms. 90 capsule 5   No current facility-administered medications for this visit.     Objective:  BP 112/66   Pulse 76   Temp (!) 97.3 F (36.3 C)   Ht 6' (1.829 m)   SpO2 98%   BMI 52.76 kg/m  Gen: NAD, resting comfortably CV: RRR no murmurs rubs or gallops Lungs: CTAB no crackles, wheeze, rhonchi Ext: stable edema- wears knee high socks Skin: warm, dry In left flank knot like sensations noted in area of pain- seem to be superficial to be muscle spasms but still potentially the cause In wheelchair as per baseline    Assessment and Plan   # Back pain with pain radiating around both flanks S:saw Dr. Ophelia Charter on 09/29/23. X-rays with arthritic changes - has seen Dr. Jean Rosenthal since that time and has MR lumbar and thoracic spine ordered - unable to tolerate MRI due to the pain with laying down -still worse on the left but now having some cramping/stabbing pain on both flanks -did well up until MRI but afterwards flared things back up.  A/P: essentially he is looking into alternative MRI out of [place]- 17 inches wide and max of 300 lbs so not sure if would work for him.   Pain poorly controlled particularly the morning- tizanidine (will change to #90 from 30 with how helpful and needed it is)  and tylenol still helpful in morning. Also using some gabapentin.  -has follow up with Dr. Jean Rosenthal tomorrow  -we discussed trial of TENS unit for low back and possibly for his side- he is giong to double check with DrJean Rosenthal for his opinion  % Cord compression/tetraplegia/sequela of C4 spinal cord injury- patient with severe injury April 2019- has used wheelchair since that time.  Is able to walk with his walker as well.Follows with Dr. Hermelinda Medicus now as needed only   # Recurrent pulmonary embolism  S: Prior DVT 2014 after traveling- believe led to PE.  Recurrent PE 11/2017 after fall. Now on lifelong anticoagulation-Xarelto 20 mg A/P: doing well with long term therapy- continue to monitor   . #Chronic systolic and diastolic heart failure/hypertension S: Compliant lasix as needed in past- not needing now , metoprolol 50mg  BID, terazosin 10mg  (also helps with BPH). Takes potassium through urology For CHF-  Reports perceived to be stable but not possible to weigh . Edema no worse A/P: CHF well controlled /stable- continue current medications   Hypertension stable- continue current medicines   #CAD/hyperlipidemia S: CAD followed by Dr.  Herbie Baltimore with history of NSTEMI.  Compliant with aspirin.  Also on Xarelto for recurrent PE.  Also on atorvastatin 80 mg - no chest pain or shortness of breath  Lab Results  Component Value Date   CHOL 103 07/01/2022   HDL 36.70 (L) 07/01/2022   LDLCALC 52 07/01/2022   LDLDIRECT 72.0 10/09/2016   TRIG 71.0 07/01/2022   CHOLHDL 3 07/01/2022   A/P: coronary artery disease asymptomatic but sedentary- continue current medications  Lipids hopefully stable- update lipid panel today. Continue current meds for now   -due for aneurysm follow up with cardiology- encouraged him to schedule- looking back I actually ordered last check in 2022 CT angio then cardiology 2023 echo and offered to reorder today but he wants to get back situation under control first- worried about pain laying down for it  # Diabetes/morbid obesity.  S: medication(s): Wants to work on diet and exercise control-in 2024 has jardiance 10 mgprescribed but not taking For morbid obesity and diabetes exercise and diet- trying to watch his sugar and pasta intake still- back is limiting exercise  Lab Results  Component Value Date   HGBA1C 7.4 (H) 07/30/2023   HGBA1C 7.5 (H) 02/02/2023   HGBA1C 7.7 (H) 10/07/2022  A/P: hopefully stable or improved- update a1c today. Continue current meds for now   -In late 2024 he is open to trialing Ozempic- and again  today but wants to work on back first  #dermatitis stasis- working with Dr. Gwen Pounds- some of dead skin coming off  Recommended follow up: Return in about 4 months (around 04/01/2024) for followup or sooner if needed.Schedule b4 you leave. Future Appointments  Date Time Provider Department Center  12/02/2023  3:15 PM Richardean Sale, DO [facility] None  02/18/2024 12:00 PM Deirdre Evener, MD [facility] None  09/20/2024  1:40 PM [facility] ANNUAL WELLNESS VISIT 1 [facility]    Lab/Order associations:   ICD-10-CM   1. Controlled type 2 diabetes mellitus without complication, without long-term current use of insulin (HCC)  E11.9 Hemoglobin A1c    Urine Microalbumin w/creat. ratio    Comprehensive metabolic panel with GFR    CBC with Differential/Platelet    Lipid panel    2. Recurrent pulmonary embolism (HCC)  I26.99     3. Essential hypertension  I10 Comprehensive metabolic panel with GFR    CBC with Differential/Platelet    Lipid panel    4. Hyperlipidemia associated with type 2 diabetes mellitus (HCC)  E11.69 Comprehensive metabolic panel with GFR   E78.5 CBC with Differential/Platelet    Lipid panel    5. Tetraplegia (HCC) Chronic G82.50     6. Chronic combined systolic and diastolic heart failure (HCC) Chronic I50.42       Meds ordered this encounter  Medications   tizanidine (ZANAFLEX) 2 MG capsule    Sig: Take 1 capsule (2 mg total) by mouth 3 (three) times daily as needed for muscle spasms.    Dispense:  90 capsule    Refill:  5    Return precautions advised.  Tana Conch, MD

## 2023-12-02 ENCOUNTER — Ambulatory Visit: Admitting: Sports Medicine

## 2023-12-02 ENCOUNTER — Encounter: Payer: Self-pay | Admitting: Family Medicine

## 2023-12-02 VITALS — HR 78 | Ht 72.0 in | Wt 389.0 lb

## 2023-12-02 DIAGNOSIS — M51362 Other intervertebral disc degeneration, lumbar region with discogenic back pain and lower extremity pain: Secondary | ICD-10-CM | POA: Diagnosis not present

## 2023-12-02 DIAGNOSIS — M545 Low back pain, unspecified: Secondary | ICD-10-CM

## 2023-12-02 DIAGNOSIS — G8929 Other chronic pain: Secondary | ICD-10-CM

## 2023-12-02 DIAGNOSIS — M5134 Other intervertebral disc degeneration, thoracic region: Secondary | ICD-10-CM | POA: Diagnosis not present

## 2023-12-02 LAB — HEMOGLOBIN A1C: Hgb A1c MFr Bld: 7 % — ABNORMAL HIGH (ref 4.6–6.5)

## 2023-12-02 LAB — CBC WITH DIFFERENTIAL/PLATELET
Basophils Absolute: 0 10*3/uL (ref 0.0–0.1)
Basophils Relative: 0.5 % (ref 0.0–3.0)
Eosinophils Absolute: 0.1 10*3/uL (ref 0.0–0.7)
Eosinophils Relative: 1.4 % (ref 0.0–5.0)
HCT: 40.7 % (ref 39.0–52.0)
Hemoglobin: 13.4 g/dL (ref 13.0–17.0)
Lymphocytes Relative: 24.4 % (ref 12.0–46.0)
Lymphs Abs: 1.8 10*3/uL (ref 0.7–4.0)
MCHC: 33 g/dL (ref 30.0–36.0)
MCV: 91.5 fl (ref 78.0–100.0)
Monocytes Absolute: 0.5 10*3/uL (ref 0.1–1.0)
Monocytes Relative: 7.1 % (ref 3.0–12.0)
Neutro Abs: 4.8 10*3/uL (ref 1.4–7.7)
Neutrophils Relative %: 66.6 % (ref 43.0–77.0)
Platelets: 254 10*3/uL (ref 150.0–400.0)
RBC: 4.45 Mil/uL (ref 4.22–5.81)
RDW: 14.4 % (ref 11.5–15.5)
WBC: 7.2 10*3/uL (ref 4.0–10.5)

## 2023-12-02 LAB — COMPREHENSIVE METABOLIC PANEL WITH GFR
ALT: 14 U/L (ref 0–53)
AST: 18 U/L (ref 0–37)
Albumin: 3.7 g/dL (ref 3.5–5.2)
Alkaline Phosphatase: 80 U/L (ref 39–117)
BUN: 20 mg/dL (ref 6–23)
CO2: 26 meq/L (ref 19–32)
Calcium: 9 mg/dL (ref 8.4–10.5)
Chloride: 103 meq/L (ref 96–112)
Creatinine, Ser: 1.1 mg/dL (ref 0.40–1.50)
GFR: 66.17 mL/min (ref >60.00)
Glucose, Bld: 122 mg/dL — ABNORMAL HIGH (ref 70–99)
Potassium: 4.6 meq/L (ref 3.5–5.1)
Sodium: 137 meq/L (ref 135–145)
Total Bilirubin: 0.6 mg/dL (ref 0.2–1.2)
Total Protein: 7.6 g/dL (ref 6.0–8.3)

## 2023-12-02 LAB — MICROALBUMIN / CREATININE URINE RATIO
Creatinine,U: 137.9 mg/dL
Microalb Creat Ratio: UNDETERMINED mg/g (ref 0.0–30.0)
Microalb, Ur: <0.7 mg/dL

## 2023-12-02 LAB — LIPID PANEL
Cholesterol: 101 mg/dL (ref 0–200)
HDL: 37.8 mg/dL — ABNORMAL LOW (ref >39.00)
LDL Cholesterol: 50 mg/dL (ref 0–99)
NonHDL: 62.99
Total CHOL/HDL Ratio: 3
Triglycerides: 64 mg/dL (ref 0.0–149.0)
VLDL: 12.8 mg/dL (ref 0.0–40.0)

## 2023-12-02 MED ORDER — TRAMADOL HCL 50 MG PO TABS: 50.0000 mg | ORAL_TABLET | Freq: Four times a day (QID) | ORAL | 0 refills | Status: AC | PRN

## 2023-12-02 MED ORDER — GABAPENTIN 100 MG PO CAPS: 100.0000 mg | ORAL_CAPSULE | Freq: Three times a day (TID) | ORAL | 1 refills | Status: DC

## 2023-12-02 NOTE — Patient Instructions (Addendum)
 Recommend taking gabapentin 100 mg 3x times a day  Recommend tylenol 1000 mg 3x a day  CT thoracic and lumbar  Prior to CT recommend taking tylenol 1000 mg , gabapentin 100 mg and tramadol 50 mg  Can also use tramadol 50 mg 2x daily for severe breakthrough pain Follow up 5 days after CT to discuss results

## 2023-12-03 NOTE — Addendum Note (Signed)
 Addended by: Jerene Canny R on: 12/03/2023 04:15 PM   Modules accepted: Orders

## 2023-12-08 ENCOUNTER — Ambulatory Visit: Payer: Medicare PPO | Admitting: Podiatry

## 2023-12-14 ENCOUNTER — Inpatient Hospital Stay (HOSPITAL_COMMUNITY)
Admission: EM | Admit: 2023-12-14 | Discharge: 2023-12-21 | DRG: 552 | Disposition: A | Discharge status: Home Health | Attending: Internal Medicine | Admitting: Internal Medicine

## 2023-12-14 ENCOUNTER — Emergency Department (HOSPITAL_COMMUNITY)

## 2023-12-14 ENCOUNTER — Telehealth: Payer: Self-pay | Admitting: Sports Medicine

## 2023-12-14 ENCOUNTER — Encounter (HOSPITAL_COMMUNITY): Payer: Self-pay

## 2023-12-14 ENCOUNTER — Other Ambulatory Visit: Payer: Self-pay

## 2023-12-14 DIAGNOSIS — E119 Type 2 diabetes mellitus without complications: Secondary | ICD-10-CM | POA: Diagnosis present

## 2023-12-14 DIAGNOSIS — M549 Dorsalgia, unspecified: Secondary | ICD-10-CM | POA: Diagnosis not present

## 2023-12-14 DIAGNOSIS — M545 Low back pain, unspecified: Secondary | ICD-10-CM | POA: Diagnosis not present

## 2023-12-14 DIAGNOSIS — E049 Nontoxic goiter, unspecified: Secondary | ICD-10-CM | POA: Diagnosis present

## 2023-12-14 DIAGNOSIS — Z86711 Personal history of pulmonary embolism: Secondary | ICD-10-CM | POA: Diagnosis not present

## 2023-12-14 DIAGNOSIS — I2510 Atherosclerotic heart disease of native coronary artery without angina pectoris: Secondary | ICD-10-CM | POA: Diagnosis present

## 2023-12-14 DIAGNOSIS — Z6841 Body Mass Index (BMI) 40.0 and over, adult: Secondary | ICD-10-CM | POA: Diagnosis not present

## 2023-12-14 DIAGNOSIS — M48061 Spinal stenosis, lumbar region without neurogenic claudication: Secondary | ICD-10-CM | POA: Diagnosis present

## 2023-12-14 DIAGNOSIS — Z7901 Long term (current) use of anticoagulants: Secondary | ICD-10-CM | POA: Diagnosis not present

## 2023-12-14 DIAGNOSIS — Z79899 Other long term (current) drug therapy: Secondary | ICD-10-CM | POA: Diagnosis not present

## 2023-12-14 DIAGNOSIS — Z888 Allergy status to other drugs, medicaments and biological substances status: Secondary | ICD-10-CM

## 2023-12-14 DIAGNOSIS — M4626 Osteomyelitis of vertebra, lumbar region: Secondary | ICD-10-CM | POA: Diagnosis not present

## 2023-12-14 DIAGNOSIS — Z981 Arthrodesis status: Secondary | ICD-10-CM

## 2023-12-14 DIAGNOSIS — I700 Atherosclerosis of aorta: Secondary | ICD-10-CM | POA: Diagnosis present

## 2023-12-14 DIAGNOSIS — I252 Old myocardial infarction: Secondary | ICD-10-CM | POA: Diagnosis not present

## 2023-12-14 DIAGNOSIS — G8929 Other chronic pain: Secondary | ICD-10-CM | POA: Diagnosis present

## 2023-12-14 DIAGNOSIS — I472 Ventricular tachycardia, unspecified: Secondary | ICD-10-CM | POA: Diagnosis present

## 2023-12-14 DIAGNOSIS — Z7982 Long term (current) use of aspirin: Secondary | ICD-10-CM | POA: Diagnosis not present

## 2023-12-14 DIAGNOSIS — I5042 Chronic combined systolic (congestive) and diastolic (congestive) heart failure: Secondary | ICD-10-CM | POA: Diagnosis present

## 2023-12-14 DIAGNOSIS — M5116 Intervertebral disc disorders with radiculopathy, lumbar region: Secondary | ICD-10-CM | POA: Diagnosis not present

## 2023-12-14 DIAGNOSIS — Z87891 Personal history of nicotine dependence: Secondary | ICD-10-CM | POA: Diagnosis not present

## 2023-12-14 DIAGNOSIS — M47816 Spondylosis without myelopathy or radiculopathy, lumbar region: Secondary | ICD-10-CM | POA: Diagnosis not present

## 2023-12-14 DIAGNOSIS — L309 Dermatitis, unspecified: Secondary | ICD-10-CM | POA: Diagnosis present

## 2023-12-14 DIAGNOSIS — I110 Hypertensive heart disease with heart failure: Secondary | ICD-10-CM | POA: Diagnosis present

## 2023-12-14 DIAGNOSIS — D1809 Hemangioma of other sites: Secondary | ICD-10-CM | POA: Diagnosis not present

## 2023-12-14 DIAGNOSIS — E7800 Pure hypercholesterolemia, unspecified: Secondary | ICD-10-CM | POA: Diagnosis present

## 2023-12-14 DIAGNOSIS — M546 Pain in thoracic spine: Secondary | ICD-10-CM | POA: Diagnosis present

## 2023-12-14 DIAGNOSIS — M5117 Intervertebral disc disorders with radiculopathy, lumbosacral region: Secondary | ICD-10-CM | POA: Diagnosis not present

## 2023-12-14 DIAGNOSIS — R9431 Abnormal electrocardiogram [ECG] [EKG]: Secondary | ICD-10-CM | POA: Diagnosis not present

## 2023-12-14 DIAGNOSIS — M4624 Osteomyelitis of vertebra, thoracic region: Secondary | ICD-10-CM

## 2023-12-14 DIAGNOSIS — Z955 Presence of coronary angioplasty implant and graft: Secondary | ICD-10-CM | POA: Diagnosis not present

## 2023-12-14 DIAGNOSIS — M5136 Other intervertebral disc degeneration, lumbar region with discogenic back pain only: Secondary | ICD-10-CM | POA: Diagnosis present

## 2023-12-14 DIAGNOSIS — M4804 Spinal stenosis, thoracic region: Secondary | ICD-10-CM | POA: Diagnosis present

## 2023-12-14 DIAGNOSIS — I493 Ventricular premature depolarization: Secondary | ICD-10-CM | POA: Diagnosis present

## 2023-12-14 DIAGNOSIS — M438X4 Other specified deforming dorsopathies, thoracic region: Secondary | ICD-10-CM | POA: Diagnosis not present

## 2023-12-14 DIAGNOSIS — I872 Venous insufficiency (chronic) (peripheral): Secondary | ICD-10-CM | POA: Diagnosis present

## 2023-12-14 DIAGNOSIS — K5900 Constipation, unspecified: Secondary | ICD-10-CM | POA: Diagnosis present

## 2023-12-14 DIAGNOSIS — M4646 Discitis, unspecified, lumbar region: Secondary | ICD-10-CM | POA: Diagnosis not present

## 2023-12-14 LAB — CBC WITH DIFFERENTIAL/PLATELET
Abs Immature Granulocytes: 0.03 10*3/uL (ref 0.00–0.07)
Basophils Absolute: 0 10*3/uL (ref 0.0–0.1)
Basophils Relative: 0 %
Eosinophils Absolute: 0.1 10*3/uL (ref 0.0–0.5)
Eosinophils Relative: 1 %
HCT: 41 % (ref 39.0–52.0)
Hemoglobin: 13 g/dL (ref 13.0–17.0)
Immature Granulocytes: 0 %
Lymphocytes Relative: 18 %
Lymphs Abs: 1.6 10*3/uL (ref 0.7–4.0)
MCH: 29.7 pg (ref 26.0–34.0)
MCHC: 31.7 g/dL (ref 30.0–36.0)
MCV: 93.8 fL (ref 80.0–100.0)
Monocytes Absolute: 0.6 10*3/uL (ref 0.1–1.0)
Monocytes Relative: 7 %
Neutro Abs: 6.4 10*3/uL (ref 1.7–7.7)
Neutrophils Relative %: 74 %
Platelets: 241 10*3/uL (ref 150–400)
RBC: 4.37 MIL/uL (ref 4.22–5.81)
RDW: 14 % (ref 11.5–15.5)
WBC: 8.7 10*3/uL (ref 4.0–10.5)
nRBC: 0 % (ref 0.0–0.2)

## 2023-12-14 LAB — BASIC METABOLIC PANEL WITH GFR
Anion gap: 9 (ref 5–15)
BUN: 19 mg/dL (ref 8–23)
CO2: 24 mmol/L (ref 22–32)
Calcium: 8.8 mg/dL — ABNORMAL LOW (ref 8.9–10.3)
Chloride: 103 mmol/L (ref 98–111)
Creatinine, Ser: 1.12 mg/dL (ref 0.61–1.24)
GFR, Estimated: >60 mL/min (ref >60)
Glucose, Bld: 139 mg/dL — ABNORMAL HIGH (ref 70–99)
Potassium: 4.1 mmol/L (ref 3.5–5.1)
Sodium: 136 mmol/L (ref 135–145)

## 2023-12-14 MED ORDER — VANCOMYCIN HCL 2000 MG/400ML IV SOLN
2000.0000 mg | Freq: Once | INTRAVENOUS | Status: AC
Start: 2023-12-14 — End: 2023-12-15
  Administered 2023-12-15: 2000 mg via INTRAVENOUS
  Filled 2023-12-14: qty 400

## 2023-12-14 MED ORDER — MORPHINE SULFATE (PF) 4 MG/ML IV SOLN
4.0000 mg | Freq: Once | INTRAVENOUS | Status: DC
  Filled 2023-12-14: qty 1

## 2023-12-14 MED ORDER — IOHEXOL 300 MG/ML  SOLN
100.0000 mL | Freq: Once | INTRAMUSCULAR | Status: AC | PRN
  Administered 2023-12-14: 100 mL via INTRAVENOUS

## 2023-12-14 MED ORDER — KETOROLAC TROMETHAMINE 30 MG/ML IJ SOLN
30.0000 mg | Freq: Once | INTRAMUSCULAR | Status: AC
  Administered 2023-12-14: 30 mg via INTRAVENOUS
  Filled 2023-12-14: qty 1

## 2023-12-14 MED ORDER — MORPHINE SULFATE (PF) 4 MG/ML IV SOLN
4.0000 mg | Freq: Once | INTRAVENOUS | Status: AC
  Administered 2023-12-14: 4 mg via INTRAVENOUS
  Filled 2023-12-14: qty 1

## 2023-12-14 MED ORDER — SODIUM CHLORIDE 0.9 % IV SOLN
2.0000 g | Freq: Once | INTRAVENOUS | Status: AC
  Administered 2023-12-14: 2 g via INTRAVENOUS
  Filled 2023-12-14: qty 20

## 2023-12-14 NOTE — Telephone Encounter (Signed)
 Patient's daughter called and wants Dr. Cleora Daft to know that he is being taken by ambulance to the hospital here locally because he is in excruciating pain. She provided a call back number [phone])- Cathleen Coach

## 2023-12-14 NOTE — Addendum Note (Signed)
 Addended by: Olinda Bertrand T on: 12/14/2023 01:56 PM   Modules accepted: Orders

## 2023-12-14 NOTE — Telephone Encounter (Signed)
 Confirmed that orders were entered and approved. Contacted the hospital and he is good to schedule. I called the daughter and informed. She said that he was currently on the way to [facility] by ambulance.

## 2023-12-14 NOTE — ED Triage Notes (Signed)
 BIB [facility] from home for lower back pain that started a few months ago and has progressively gotten worse.Pt is now having difficulty standing.

## 2023-12-14 NOTE — Telephone Encounter (Signed)
 I received a call from Cathleen Coach (the patient's daughter) following up on the status of recent orders that were placed for CT Lumbar, CT Spine and Myelography.  She said that they have not heard from anyone and his is still in a lot of pain that is getting worse.  This was ordered to [facility] but I see a note in the order that says "12/08/23 PATIENT BEING SEEN AT THE HOSPITAL DUE TO WEIGHT LIMIT [employer] ".   Do you know if this was changed?   *There is still pending insurance approval for the original orders from 4/11 so not sure if this is from the one sent to [facility]?   Janifer Meigs (daughter) [phone]

## 2023-12-14 NOTE — ED Provider Notes (Signed)
 [facility] AT [facility] Provider Note   CSN: [id number] Arrival date & time: 12/14/23  1415     History  Chief Complaint  Patient presents with   Back Pain    Franklin Hicks is a 73 y.o. male.  With a history of DJD, arthritis, obesity, hypertension who presents to the ED for ongoing back pain.  Patient first experienced pain throughout his lower back in November 2024 which is worsened since the onset.  No inciting injury or trauma.  He was seen by his PCP for this reason referred to sports medicine specialist (Dr. Cleora Daft) who intended on completing an MRI thoracic and lumbar spine however patient could not tolerate this study due to a prolonged period of laying flat with worsening back pain.  Dr. Cleora Daft had then ordered a CT lumbar spine and CT thoracic spine with contrast but this study has not been taken yet.  Worsening back pain despite tizanidine  and gabapentin  at home.  No inciting injuries or trauma.  No focal weakness, urinary incontinence, fecal incontinence, urinary retention, loss of sensation.  Patient able to walk but it is very painful   Back Pain      Home Medications Prior to Admission medications   Medication Sig Start Date End Date Taking? Authorizing Provider  acetaminophen  (TYLENOL ) 650 MG CR tablet Take 650-1,300 mg by mouth every 8 (eight) hours as needed for pain.    [provider]  ammonium lactate  (AMLACTIN) 12 % lotion Apply 1 application topically as needed for dry skin. 01/30/20   Velma Ghazi, DPM  aspirin  EC 81 MG tablet Take 1 tablet (81 mg total) by mouth daily. Patient taking differently: Take 81 mg by mouth in the morning. 05/26/18   Arleen Lacer, MD  atorvastatin  (LIPITOR ) 80 MG tablet TAKE 1 TABLET BY MOUTH DAILY AT 6 PM. 11/12/23   Almira Jaeger, MD  Chromium  Picolinate (CHROMIUM  PICOLATE PO) Take 1 tablet by mouth 3 (three) times a week.    [provider]  clotrimazole -betamethasone   (LOTRISONE ) cream Apply to both lower extremities twice daily. 10/20/22   Galaway, Jennifer L, DPM  diclofenac  sodium (VOLTAREN ) 1 % GEL Apply 1 application topically 3 (three) times daily. Both knees 03/10/18   Rawland Caddy, MD  Dietary Management Product [facility]) TABS Take 1 tablet once a day 10/26/23   Elta Halter, MD  fluticasone  (FLONASE ) 50 MCG/ACT nasal spray SPRAY 2 SPRAYS INTO EACH NOSTRIL EVERY DAY 09/09/21   Almira Jaeger, MD  gabapentin  (NEURONTIN ) 100 MG capsule Take 1 capsule (100 mg total) by mouth 3 (three) times daily. 12/02/23   Ulysees Gander, DO  metoprolol  tartrate (LOPRESSOR ) 50 MG tablet Take 1 tablet (50 mg total) by mouth 2 (two) times daily. 05/22/23   Tania Familia, NP  Multiple Vitamin (MULTIVITAMIN WITH MINERALS) TABS tablet Take 1 tablet by mouth daily. 01/13/18   Love, Renay Carota, PA-C  Potassium Citrate  15 MEQ (1620 MG) TBCR Take 1 tablet by mouth 2 (two) times daily. 03/10/18   [provider]  rivaroxaban  (XARELTO ) 20 MG TABS tablet Take 1 tablet (20 mg total) by mouth daily. 05/22/23   Tania Familia, NP  tacrolimus  (PROTOPIC ) 0.1 % ointment Apply to legs twice a day 10/26/23   Elta Halter, MD  terazosin  (HYTRIN ) 10 MG capsule TAKE 1 CAPSULE BY MOUTH EVERYDAY AT BEDTIME 11/18/23   Almira Jaeger, MD  tizanidine  (ZANAFLEX ) 2 MG capsule Take 1  capsule (2 mg total) by mouth 3 (three) times daily as needed for muscle spasms. 12/01/23   Almira Jaeger, MD  vitamin C (ASCORBIC ACID) 500 MG tablet Take 500 mg by mouth daily in the afternoon.    [provider]      Allergies    Ace inhibitors, Gabapentin , and Other    Review of Systems   Review of Systems  Musculoskeletal:  Positive for back pain.    Physical Exam Updated Vital Signs BP (!) 148/68   Pulse 80   Temp 97.7 F (36.5 C) (Oral)   Resp 16   Ht 6' (1.829 m)   Wt (!) 170.1 kg   SpO2 97%   BMI 50.86 kg/m  Physical Exam Vitals and nursing note reviewed.   HENT:     Head: Normocephalic and atraumatic.  Eyes:     Pupils: Pupils are equal, round, and reactive to light.  Cardiovascular:     Rate and Rhythm: Normal rate and regular rhythm.  Pulmonary:     Effort: Pulmonary effort is normal.     Breath sounds: Normal breath sounds.  Abdominal:     Palpations: Abdomen is soft.     Tenderness: There is no abdominal tenderness.  Musculoskeletal:     Comments: Midline and paraspinal lumbar tenderness bilaterally Sensation intact light touch throughout bilateral lower extremities 5 out of 5 motor strength bilateral lower extremities  Skin:    General: Skin is warm and dry.  Neurological:     Mental Status: He is alert.  Psychiatric:        Mood and Affect: Mood normal.     ED Results / Procedures / Treatments   Labs (all labs ordered are listed, but only abnormal results are displayed) Labs Reviewed  BASIC METABOLIC PANEL WITH GFR - Abnormal; Notable for the following components:      Result Value   Glucose, Bld 139 (*)    Calcium  8.8 (*)    All other components within normal limits  CBC WITH DIFFERENTIAL/PLATELET    EKG None  Radiology CT LUMBAR SPINE W CONTRAST Result Date: 12/14/2023 CLINICAL DATA:  Thoracic radiculopathy with infection suspected EXAM: CT THORACIC AND LUMBAR SPINE WITH CONTRAST TECHNIQUE: Multidetector CT imaging of the thoracic and lumbar spine was performed with intravenous contrast administration. RADIATION DOSE REDUCTION: This exam was performed according to the departmental dose-optimization program which includes automated exposure control, adjustment of the mA and/or kV according to patient size and/or use of iterative reconstruction technique. CONTRAST:  OMNIPAQUE  IOHEXOL  300 MG/ML  SOLN COMPARISON:  None Available. FINDINGS: Alignment: Normal. Vertebrae: There are sclerotic changes at the T9-T12 levels. At T11-12, there is widening of the disc space with opposing endplate erosion, most consistent  with discitis-osteomyelitis. The L4-L5 vertebral bodies are fused anteriorly. Lumbar segmentation is normal. Paraspinal and other soft tissues: Heterogeneous and mildly enlarged thyroid  gland. Calcific aortic atherosclerosis. Disc levels: There is moderate spinal canal stenosis at the T11-12 level. There is severe facet hypertrophy at the L2-5 levels. This contributes to moderate-to-severe foraminal stenosis at L2-S1, greatest at the L5-S1 level. There is moderate spinal canal stenosis at the L3-S1 levels due to combination of degenerative disc and facet disease. IMPRESSION: 1. Findings most consistent with discitis-osteomyelitis at T11-12. Acuity is uncertain. MRI with and without contrast may be helpful for further characterization. 2. Moderate spinal canal stenosis at T11-12 and L3-S1. 3. Severe facet hypertrophy at the L2-5 levels with moderate-to-severe foraminal stenosis at L2-S1, greatest at  the L5-S1 level. 4. Heterogeneous and mildly enlarged thyroid  gland. Recommend correlation with nonemergent thyroid  ultrasound. Aortic atherosclerosis (ICD10-I70.0). Electronically Signed   By: Juanetta Nordmann M.D.   On: 12/14/2023 22:49   CT THORACIC SPINE W CONTRAST Result Date: 12/14/2023 CLINICAL DATA:  Thoracic radiculopathy with infection suspected EXAM: CT THORACIC AND LUMBAR SPINE WITH CONTRAST TECHNIQUE: Multidetector CT imaging of the thoracic and lumbar spine was performed with intravenous contrast administration. RADIATION DOSE REDUCTION: This exam was performed according to the departmental dose-optimization program which includes automated exposure control, adjustment of the mA and/or kV according to patient size and/or use of iterative reconstruction technique. CONTRAST:  OMNIPAQUE  IOHEXOL  300 MG/ML  SOLN COMPARISON:  None Available. FINDINGS: Alignment: Normal. Vertebrae: There are sclerotic changes at the T9-T12 levels. At T11-12, there is widening of the disc space with opposing endplate erosion,  most consistent with discitis-osteomyelitis. The L4-L5 vertebral bodies are fused anteriorly. Lumbar segmentation is normal. Paraspinal and other soft tissues: Heterogeneous and mildly enlarged thyroid  gland. Calcific aortic atherosclerosis. Disc levels: There is moderate spinal canal stenosis at the T11-12 level. There is severe facet hypertrophy at the L2-5 levels. This contributes to moderate-to-severe foraminal stenosis at L2-S1, greatest at the L5-S1 level. There is moderate spinal canal stenosis at the L3-S1 levels due to combination of degenerative disc and facet disease. IMPRESSION: 1. Findings most consistent with discitis-osteomyelitis at T11-12. Acuity is uncertain. MRI with and without contrast may be helpful for further characterization. 2. Moderate spinal canal stenosis at T11-12 and L3-S1. 3. Severe facet hypertrophy at the L2-5 levels with moderate-to-severe foraminal stenosis at L2-S1, greatest at the L5-S1 level. 4. Heterogeneous and mildly enlarged thyroid  gland. Recommend correlation with nonemergent thyroid  ultrasound. Aortic atherosclerosis (ICD10-I70.0). Electronically Signed   By: Juanetta Nordmann M.D.   On: 12/14/2023 22:49    Procedures Procedures    Medications Ordered in ED Medications  morphine  (PF) 4 MG/ML injection 4 mg (4 mg Intravenous Not Given 12/14/23 1950)  vancomycin  (VANCOREADY) IVPB 2000 mg/400 mL (has no administration in time range)  morphine  (PF) 4 MG/ML injection 4 mg (4 mg Intravenous Given 12/14/23 1626)  ketorolac  (TORADOL ) 30 MG/ML injection 30 mg (30 mg Intravenous Given 12/14/23 1626)  iohexol  (OMNIPAQUE ) 300 MG/ML solution 100 mL (100 mLs Intravenous Contrast Given 12/14/23 1833)  cefTRIAXone  (ROCEPHIN ) 2 g in sodium chloride  0.9 % 100 mL IVPB (2 g Intravenous New Bag/Given 12/14/23 2313)    ED Course/ Medical Decision Making/ A&P Clinical Course as of 12/14/23 2347  Mon Dec 14, 2023  1957 Patient reports significant improvement in his pain after 2 doses  of morphine  and Toradol .  Labs look okay.  Still awaiting results of CT thoracic to spine lumbar spine [MP]  2308 [place] is back.  Findings consistent with discitis versus osteomyelitis at T11-12 with uncertain acuity.  Recommend MRI with and without contrast.  Will cover with ceftriaxone  and vancomycin  and obtain MRI here tonight  Additional findings of spinal canal stenosis at T11-12 and L3-S1 as well as severe facet hypertrophy at L2 L5 levels with foraminal stenosis at L2- S1   heterogeneous and mildly enlarged thyroid  gland nonemergent thyroid  ultrasound recommended  Informed patient of these findings along with plan for MRI here tonight and antibiotics [MP]  2337 Unfortunately we do not have MRI here overnight.  Spoke with patient about waiting here overnight to obtain MRI in the morning versus transfer to [facility] to have the MRI taken.  He wants to wait here overnight.  Teresia Fennel DO, am transitioning care of this patient to the oncoming provider pending MRI, reevaluation and disposition [MP]    Clinical Course User Index [MP] Sallyanne Creamer, DO                                 Medical Decision Making 73 year old male with history as above presenting for ongoing lower back pain.  He is seen by a sports medicine specialist for this reason but no imaging has been taken yet as he has not yet had the CT thoracic or lumbar scan that have been ordered and was not able to tolerate MRI.  No red flag symptoms that would merit MRI here today but he may require this test in the future.  Will obtain basic laboratory workup along with CT thoracic and lumbar spine with contrast as ordered by sports medicine specialist once his pain is better controlled.  This will help expedite his outpatient workup.  Will provide pain relief with IV morphine  as he has already had tizanidine  and gabapentin  at home and continue to monitor  Amount and/or Complexity of Data Reviewed Labs: ordered. Radiology:  ordered.  Risk Prescription drug management.           Final Clinical Impression(s) / ED Diagnoses Final diagnoses:  Low back pain without sciatica, unspecified back pain laterality, unspecified chronicity    Rx / DC Orders ED Discharge Orders     None         Sallyanne Creamer, DO 12/14/23 2347

## 2023-12-14 NOTE — Telephone Encounter (Signed)
 Patient's daughter called back to follow up.  Advised of Dr Nickey Barn scheduled today.  She would like a call back with more information and would like to confirm that this was ordered as urgent. If she is not able to answer, please contact the patient.

## 2023-12-14 NOTE — ED Notes (Signed)
 Pt requesting pain medications before ct scan. Dr. Ranelle Buys notified and awaiting orders

## 2023-12-15 ENCOUNTER — Inpatient Hospital Stay (HOSPITAL_COMMUNITY)

## 2023-12-15 DIAGNOSIS — E049 Nontoxic goiter, unspecified: Secondary | ICD-10-CM | POA: Diagnosis present

## 2023-12-15 DIAGNOSIS — Z79899 Other long term (current) drug therapy: Secondary | ICD-10-CM | POA: Diagnosis not present

## 2023-12-15 DIAGNOSIS — I700 Atherosclerosis of aorta: Secondary | ICD-10-CM | POA: Diagnosis present

## 2023-12-15 DIAGNOSIS — I472 Ventricular tachycardia, unspecified: Secondary | ICD-10-CM | POA: Diagnosis present

## 2023-12-15 DIAGNOSIS — M4624 Osteomyelitis of vertebra, thoracic region: Secondary | ICD-10-CM | POA: Diagnosis present

## 2023-12-15 DIAGNOSIS — Z87891 Personal history of nicotine dependence: Secondary | ICD-10-CM | POA: Diagnosis not present

## 2023-12-15 DIAGNOSIS — M546 Pain in thoracic spine: Secondary | ICD-10-CM | POA: Diagnosis present

## 2023-12-15 DIAGNOSIS — R9431 Abnormal electrocardiogram [ECG] [EKG]: Secondary | ICD-10-CM | POA: Diagnosis not present

## 2023-12-15 DIAGNOSIS — M48061 Spinal stenosis, lumbar region without neurogenic claudication: Secondary | ICD-10-CM | POA: Diagnosis present

## 2023-12-15 DIAGNOSIS — E119 Type 2 diabetes mellitus without complications: Secondary | ICD-10-CM | POA: Diagnosis present

## 2023-12-15 DIAGNOSIS — G8929 Other chronic pain: Secondary | ICD-10-CM | POA: Diagnosis present

## 2023-12-15 DIAGNOSIS — Z7982 Long term (current) use of aspirin: Secondary | ICD-10-CM | POA: Diagnosis not present

## 2023-12-15 DIAGNOSIS — Z86711 Personal history of pulmonary embolism: Secondary | ICD-10-CM | POA: Diagnosis not present

## 2023-12-15 DIAGNOSIS — Z7901 Long term (current) use of anticoagulants: Secondary | ICD-10-CM | POA: Diagnosis not present

## 2023-12-15 DIAGNOSIS — I872 Venous insufficiency (chronic) (peripheral): Secondary | ICD-10-CM | POA: Diagnosis present

## 2023-12-15 DIAGNOSIS — I252 Old myocardial infarction: Secondary | ICD-10-CM | POA: Diagnosis not present

## 2023-12-15 DIAGNOSIS — I5042 Chronic combined systolic (congestive) and diastolic (congestive) heart failure: Secondary | ICD-10-CM | POA: Diagnosis present

## 2023-12-15 DIAGNOSIS — Z6841 Body Mass Index (BMI) 40.0 and over, adult: Secondary | ICD-10-CM | POA: Diagnosis not present

## 2023-12-15 DIAGNOSIS — M545 Low back pain, unspecified: Secondary | ICD-10-CM | POA: Diagnosis not present

## 2023-12-15 DIAGNOSIS — Z888 Allergy status to other drugs, medicaments and biological substances status: Secondary | ICD-10-CM | POA: Diagnosis not present

## 2023-12-15 DIAGNOSIS — I2510 Atherosclerotic heart disease of native coronary artery without angina pectoris: Secondary | ICD-10-CM | POA: Diagnosis present

## 2023-12-15 DIAGNOSIS — L309 Dermatitis, unspecified: Secondary | ICD-10-CM | POA: Diagnosis present

## 2023-12-15 DIAGNOSIS — Z955 Presence of coronary angioplasty implant and graft: Secondary | ICD-10-CM | POA: Diagnosis not present

## 2023-12-15 DIAGNOSIS — I493 Ventricular premature depolarization: Secondary | ICD-10-CM | POA: Diagnosis present

## 2023-12-15 DIAGNOSIS — M4804 Spinal stenosis, thoracic region: Secondary | ICD-10-CM | POA: Diagnosis present

## 2023-12-15 DIAGNOSIS — E7800 Pure hypercholesterolemia, unspecified: Secondary | ICD-10-CM | POA: Diagnosis present

## 2023-12-15 DIAGNOSIS — I110 Hypertensive heart disease with heart failure: Secondary | ICD-10-CM | POA: Diagnosis present

## 2023-12-15 LAB — BASIC METABOLIC PANEL WITH GFR
Anion gap: 7 (ref 5–15)
BUN: 20 mg/dL (ref 8–23)
CO2: 25 mmol/L (ref 22–32)
Calcium: 7.5 mg/dL — ABNORMAL LOW (ref 8.9–10.3)
Chloride: 102 mmol/L (ref 98–111)
Creatinine, Ser: 1.15 mg/dL (ref 0.61–1.24)
GFR, Estimated: >60 mL/min (ref >60)
Glucose, Bld: 140 mg/dL — ABNORMAL HIGH (ref 70–99)
Potassium: 3.5 mmol/L (ref 3.5–5.1)
Sodium: 134 mmol/L — ABNORMAL LOW (ref 135–145)

## 2023-12-15 LAB — CBC
HCT: 36.2 % — ABNORMAL LOW (ref 39.0–52.0)
Hemoglobin: 11.4 g/dL — ABNORMAL LOW (ref 13.0–17.0)
MCH: 30 pg (ref 26.0–34.0)
MCHC: 31.5 g/dL (ref 30.0–36.0)
MCV: 95.3 fL (ref 80.0–100.0)
Platelets: 208 10*3/uL (ref 150–400)
RBC: 3.8 MIL/uL — ABNORMAL LOW (ref 4.22–5.81)
RDW: 14.4 % (ref 11.5–15.5)
WBC: 6.3 10*3/uL (ref 4.0–10.5)
nRBC: 0 % (ref 0.0–0.2)

## 2023-12-15 LAB — PHOSPHORUS: Phosphorus: 3.4 mg/dL (ref 2.5–4.6)

## 2023-12-15 LAB — MAGNESIUM: Magnesium: 2 mg/dL (ref 1.7–2.4)

## 2023-12-15 LAB — C-REACTIVE PROTEIN: CRP: 1.3 mg/dL — ABNORMAL HIGH (ref <1.0)

## 2023-12-15 LAB — TSH: TSH: 1.326 u[IU]/mL (ref 0.350–4.500)

## 2023-12-15 LAB — SEDIMENTATION RATE: Sed Rate: 40 mm/h — ABNORMAL HIGH (ref 0–16)

## 2023-12-15 LAB — GLUCOSE, CAPILLARY
Glucose-Capillary: 149 mg/dL — ABNORMAL HIGH (ref 70–99)
Glucose-Capillary: 170 mg/dL — ABNORMAL HIGH (ref 70–99)

## 2023-12-15 LAB — CBG MONITORING, ED: Glucose-Capillary: 98 mg/dL (ref 70–99)

## 2023-12-15 MED ORDER — SODIUM CHLORIDE 0.9 % IV SOLN
2.0000 g | INTRAVENOUS | Status: DC
  Administered 2023-12-15: 2 g via INTRAVENOUS
  Filled 2023-12-15: qty 20

## 2023-12-15 MED ORDER — VANCOMYCIN HCL 750 MG/150ML IV SOLN: 750.0000 mg | INTRAVENOUS | Status: DC

## 2023-12-15 MED ORDER — OXYCODONE HCL 5 MG PO TABS
5.0000 mg | ORAL_TABLET | Freq: Four times a day (QID) | ORAL | Status: DC | PRN
  Administered 2023-12-15 – 2023-12-18 (×9): 5 mg via ORAL
  Filled 2023-12-15 (×9): qty 1

## 2023-12-15 MED ORDER — TERAZOSIN HCL 5 MG PO CAPS
10.0000 mg | ORAL_CAPSULE | Freq: Every day | ORAL | Status: DC
  Administered 2023-12-15 – 2023-12-20 (×6): 10 mg via ORAL
  Filled 2023-12-15 (×7): qty 2

## 2023-12-15 MED ORDER — ORAL CARE MOUTH RINSE: 15.0000 mL | OROMUCOSAL | Status: DC | PRN

## 2023-12-15 MED ORDER — PROCHLORPERAZINE EDISYLATE 10 MG/2ML IJ SOLN: 5.0000 mg | Freq: Four times a day (QID) | INTRAMUSCULAR | Status: DC | PRN

## 2023-12-15 MED ORDER — VANCOMYCIN HCL 1250 MG/250ML IV SOLN
1250.0000 mg | Freq: Two times a day (BID) | INTRAVENOUS | Status: DC
Start: 2023-12-15 — End: 2023-12-16
  Administered 2023-12-15 (×2): 1250 mg via INTRAVENOUS
  Filled 2023-12-15 (×3): qty 250

## 2023-12-15 MED ORDER — ASPIRIN 81 MG PO TBEC
81.0000 mg | DELAYED_RELEASE_TABLET | Freq: Every day | ORAL | Status: DC
  Administered 2023-12-15 – 2023-12-21 (×7): 81 mg via ORAL
  Filled 2023-12-15 (×8): qty 1

## 2023-12-15 MED ORDER — ACETAMINOPHEN 500 MG PO TABS
1000.0000 mg | ORAL_TABLET | Freq: Three times a day (TID) | ORAL | Status: DC
  Administered 2023-12-15 – 2023-12-21 (×20): 1000 mg via ORAL
  Filled 2023-12-15 (×20): qty 2

## 2023-12-15 MED ORDER — MELATONIN 5 MG PO TABS: 5.0000 mg | ORAL_TABLET | Freq: Every evening | ORAL | Status: DC | PRN

## 2023-12-15 MED ORDER — TACROLIMUS 0.1 % EX OINT
TOPICAL_OINTMENT | Freq: Two times a day (BID) | CUTANEOUS | Status: DC
  Administered 2023-12-21: 100 via TOPICAL

## 2023-12-15 MED ORDER — GABAPENTIN 100 MG PO CAPS
100.0000 mg | ORAL_CAPSULE | Freq: Three times a day (TID) | ORAL | Status: DC
  Administered 2023-12-15 – 2023-12-21 (×20): 100 mg via ORAL
  Filled 2023-12-15 (×20): qty 1

## 2023-12-15 MED ORDER — ATORVASTATIN CALCIUM 40 MG PO TABS
80.0000 mg | ORAL_TABLET | Freq: Every day | ORAL | Status: DC
  Administered 2023-12-15 – 2023-12-21 (×7): 80 mg via ORAL
  Filled 2023-12-15 (×7): qty 2

## 2023-12-15 MED ORDER — ACETAMINOPHEN 325 MG PO TABS: 650.0000 mg | ORAL_TABLET | Freq: Four times a day (QID) | ORAL | Status: DC | PRN

## 2023-12-15 MED ORDER — GADOBUTROL 1 MMOL/ML IV SOLN
10.0000 mL | Freq: Once | INTRAVENOUS | Status: AC | PRN
  Administered 2023-12-15: 10 mL via INTRAVENOUS

## 2023-12-15 MED ORDER — LACTATED RINGERS IV SOLN: INTRAVENOUS | Status: DC

## 2023-12-15 MED ORDER — METOPROLOL TARTRATE 50 MG PO TABS
50.0000 mg | ORAL_TABLET | Freq: Two times a day (BID) | ORAL | Status: DC
  Administered 2023-12-15 – 2023-12-21 (×13): 50 mg via ORAL
  Filled 2023-12-15 (×2): qty 1
  Filled 2023-12-15: qty 2
  Filled 2023-12-15 (×11): qty 1

## 2023-12-15 MED ORDER — INSULIN ASPART 100 UNIT/ML IJ SOLN
0.0000 [IU] | Freq: Three times a day (TID) | INTRAMUSCULAR | Status: DC
  Administered 2023-12-15: 1 [IU] via SUBCUTANEOUS
  Administered 2023-12-16 (×2): 2 [IU] via SUBCUTANEOUS
  Administered 2023-12-16 – 2023-12-17 (×2): 1 [IU] via SUBCUTANEOUS
  Administered 2023-12-17: 2 [IU] via SUBCUTANEOUS
  Administered 2023-12-17 – 2023-12-19 (×3): 1 [IU] via SUBCUTANEOUS
  Administered 2023-12-19: 2 [IU] via SUBCUTANEOUS
  Administered 2023-12-20 – 2023-12-21 (×4): 1 [IU] via SUBCUTANEOUS
  Administered 2023-12-21: 2 [IU] via SUBCUTANEOUS
  Filled 2023-12-15: qty 0.09

## 2023-12-15 MED ORDER — RIVAROXABAN 10 MG PO TABS
20.0000 mg | ORAL_TABLET | Freq: Every day | ORAL | Status: DC
  Administered 2023-12-15 – 2023-12-21 (×7): 20 mg via ORAL
  Filled 2023-12-15: qty 2
  Filled 2023-12-15: qty 1
  Filled 2023-12-15 (×5): qty 2

## 2023-12-15 MED ORDER — POLYETHYLENE GLYCOL 3350 17 G PO PACK: 17.0000 g | PACK | Freq: Every day | ORAL | Status: DC | PRN

## 2023-12-15 MED ORDER — HEPARIN SODIUM (PORCINE) 5000 UNIT/ML IJ SOLN: 5000.0000 [IU] | Freq: Three times a day (TID) | INTRAMUSCULAR | Status: DC

## 2023-12-15 MED ORDER — INSULIN ASPART 100 UNIT/ML IJ SOLN
0.0000 [IU] | Freq: Every day | INTRAMUSCULAR | Status: DC
  Filled 2023-12-15: qty 0.05

## 2023-12-15 MED ORDER — MORPHINE SULFATE (PF) 2 MG/ML IV SOLN
2.0000 mg | INTRAVENOUS | Status: DC | PRN
  Administered 2023-12-15: 2 mg via INTRAVENOUS
  Filled 2023-12-15: qty 1

## 2023-12-15 MED ORDER — METHOCARBAMOL 500 MG PO TABS
500.0000 mg | ORAL_TABLET | Freq: Three times a day (TID) | ORAL | Status: DC
  Administered 2023-12-15 – 2023-12-17 (×7): 500 mg via ORAL
  Filled 2023-12-15 (×7): qty 1

## 2023-12-15 NOTE — ED Notes (Signed)
 Patient transported to MRI

## 2023-12-15 NOTE — Progress Notes (Signed)
 Pharmacy Antibiotic Note  Franklin Hicks is a 73 y.o. male admitted on 12/14/2023 with discitis vs vertebral osteomyelitis   Patient received Vancomycin  2gm IV x 1 in the ED.  On admission, Vancomycin  750mg  IV q24h.  Per protocol, Pharmacy has adjusted the dose based on weight and renal function to Vancomycin  1250mg  IV q12h dosing.  Plan: Vancomycin  1250mg  IV q12h  Goal AUC 400-550. Expected AUC: 514.7  SCr used: 1.12 Ceftriaxone  per MD Follow renal function Monitor vancomycin  levels as needed  Height: 6' (182.9 cm) Weight: (!) 170.1 kg (375 lb) IBW/kg (Calculated) : 77.6  Temp (24hrs), Avg:97.9 F (36.6 C), Min:97.7 F (36.5 C), Max:98.2 F (36.8 C)  Recent Labs  Lab 12/14/23 1631  WBC 8.7  CREATININE 1.12    Estimated Creatinine Clearance: 93.8 mL/min (by C-G formula based on SCr of 1.12 mg/dL).    Allergies  Allergen Reactions   Ace Inhibitors     Angioedema   Gabapentin      Dizzy on 300 mg dose    Other Hives and Itching    msg    Antimicrobials this admission: 4/21 Ceftriaxone  >>   4/22 Vancomycin  >>    Dose adjustments this admission:    Microbiology results:    Thank you for allowing pharmacy to be a part of this patient's care.  Rulon Councilman, PharmD 12/15/2023 1:16 AM

## 2023-12-15 NOTE — H&P (Addendum)
 History and Physical  Franklin Hicks:[id number] DOB: 18-Sep-1950 DOA: 12/14/2023  Referring physician: Dr. Alison Irvine, EDP  PCP: Almira Jaeger, MD  Outpatient Specialists: Dermatology. Patient coming from: Home.  Chief Complaint: Back pain  HPI: Franklin Hicks is a 73 y.o. male with medical history significant for chronic thoracic back pain without sciatica, degenerative joint disease, arthritis, chronic HFpEF, history of pulmonary embolism on Xarelto , morbid obesity, hypertension, who presents to the ER with complaints of worsening back pain to the point where he is having difficulty with weightbearing.  His back pain started around November 2024 and has progressively worsened.  Denies having any trauma or injuries.  In the ER, CT thoracic spine revealed findings most consistent with discitis osteomyelitis at T11-T12.  Acuity is not certain.  MRI with and without contrast may be helpful for further characterization.  Moderate spinal canal stenosis at T11-12 and L3-S1.  Severe facet hypertrophy at the L2-5 levels with moderate to severe foraminal stenosis at L2-S1, greatest at the L5-S1 level.  Heterogeneous and mildly enlarged thyroid  gland.  Recommend correlation with nonemergent thyroid  ultrasound.  Aortic atherosclerosis.  MRI of thoracic and lumbar spine were ordered and are pending.  The patient was started on empiric IV antibiotics IV vancomycin  and Rocephin  by EDP.  [facility], hospitalist service, was asked to admit.  ED Course: Temperature 98.  BP 161/71, pulse 87, respiratory 18, O2 saturation 95% on room air.  Review of Systems: Review of systems as noted in the HPI. All other systems reviewed and are negative.   Past Medical History:  Diagnosis Date   Arthritis    "knees" (12/17/2017)   CAD S/P percutaneous coronary angioplasty 09/20/2016   Nstemi 08/2016. Dr. Sharyn Deforest. DES- brilinta  and asa. Requests change to [facility] cardiology; 95% Ramus -> PCI Resolute Onyx DES 2.75 x 18   Central cord  syndrome (HCC) 12/17/2017   Chronic combined systolic and diastolic heart failure (HCC) 10/09/2016   EF 45% and grade II diastolic after nstemi   Diet-controlled diabetes mellitus (HCC)    DJD (degenerative joint disease)    Gout    High cholesterol    Hypertension    NSTEMI (non-ST elevated myocardial infarction) (HCC) 09/20/2016   95% Ramus - > PCI    Obesity    Recurrent pulmonary embolism (HCC) 11/'14; 4/'19   a) Bilateral segmental and subsegmental pulmonary emboli with mild RV Strain.; b) after fall with C-spine Fxr --> Acute PE of right main pulmonary artery extending into multiple segments.   Past Surgical History:  Procedure Laterality Date   CARDIAC CATHETERIZATION N/A 09/19/2016   Procedure: Left Heart Cath and Coronary Angiography;  Surgeon: Pasqual Bone, MD;  Location: [facility] INVASIVE CV LAB;  Service: Cardiovascular: 95% proximal Ramus Intermedius --> PCI   CARDIAC CATHETERIZATION N/A 09/19/2016   Procedure: Coronary Stent Intervention;  Surgeon: Sammy Crisp, MD;  Location: [facility] INVASIVE CV LAB;  Service: Cardiovascular: 95% ramus intermedius;  Resolute Onyx 2.75 x 18 mm drug-eluting stent   CYSTOSCOPY/RETROGRADE/URETEROSCOPY/STONE EXTRACTION WITH BASKET  9562,1308   ureteral stone    MENISCUS REPAIR Left    knee open meniscetomy   TRANSTHORACIC ECHOCARDIOGRAM  2004   no lvh nl ejection fraction   TRANSTHORACIC ECHOCARDIOGRAM  09/19/2016   In setting of NSTEMI:  EF 45-50% with diffuse hypokinesis. GR 2 DD. Mild biatrial enlargement.    Social History:  reports that he quit smoking about 46 years ago. His smoking use included cigarettes. He started smoking about 59 years ago.  He has a 13 pack-year smoking history. He has never used smokeless tobacco. He reports current alcohol use. He reports that he does not use drugs.   Allergies  Allergen Reactions   Ace Inhibitors     Angioedema   Gabapentin      Dizzy on 300 mg dose    Other Hives and Itching    msg    Family  History  Problem Relation Age of Onset   Liver disease Mother       Prior to Admission medications   Medication Sig Start Date End Date Taking? Authorizing Provider  baclofen (LIORESAL) 20 MG tablet  08/16/23  Yes [provider]  acetaminophen  (TYLENOL ) 650 MG CR tablet Take 650-1,300 mg by mouth every 8 (eight) hours as needed for pain.    [provider]  ammonium lactate  (AMLACTIN) 12 % lotion Apply 1 application topically as needed for dry skin. 01/30/20   Velma Ghazi, DPM  aspirin  EC 81 MG tablet Take 1 tablet (81 mg total) by mouth daily. Patient taking differently: Take 81 mg by mouth in the morning. 05/26/18   Arleen Lacer, MD  atorvastatin  (LIPITOR ) 80 MG tablet TAKE 1 TABLET BY MOUTH DAILY AT 6 PM. 11/12/23   Almira Jaeger, MD  Chromium  Picolinate (CHROMIUM  PICOLATE PO) Take 1 tablet by mouth 3 (three) times a week.    [provider]  clotrimazole -betamethasone  (LOTRISONE ) cream Apply to both lower extremities twice daily. 10/20/22   Galaway, Jennifer L, DPM  diclofenac  sodium (VOLTAREN ) 1 % GEL Apply 1 application topically 3 (three) times daily. Both knees 03/10/18   Rawland Caddy, MD  Dietary Management Product [facility]) TABS Take 1 tablet once a day 10/26/23   Elta Halter, MD  fluticasone  (FLONASE ) 50 MCG/ACT nasal spray SPRAY 2 SPRAYS INTO EACH NOSTRIL EVERY DAY 09/09/21   Almira Jaeger, MD  gabapentin  (NEURONTIN ) 100 MG capsule Take 1 capsule (100 mg total) by mouth 3 (three) times daily. 12/02/23   Ulysees Gander, DO  metoprolol  tartrate (LOPRESSOR ) 50 MG tablet Take 1 tablet (50 mg total) by mouth 2 (two) times daily. 05/22/23   Tania Familia, NP  Multiple Vitamin (MULTIVITAMIN WITH MINERALS) TABS tablet Take 1 tablet by mouth daily. 01/13/18   Love, Renay Carota, PA-C  Potassium Citrate  15 MEQ (1620 MG) TBCR Take 1 tablet by mouth 2 (two) times daily. 03/10/18   [provider]  rivaroxaban  (XARELTO ) 20 MG TABS tablet  Take 1 tablet (20 mg total) by mouth daily. 05/22/23   Tania Familia, NP  tacrolimus  (PROTOPIC ) 0.1 % ointment Apply to legs twice a day 10/26/23   Elta Halter, MD  terazosin  (HYTRIN ) 10 MG capsule TAKE 1 CAPSULE BY MOUTH EVERYDAY AT BEDTIME 11/18/23   Almira Jaeger, MD  tizanidine  (ZANAFLEX ) 2 MG capsule Take 1 capsule (2 mg total) by mouth 3 (three) times daily as needed for muscle spasms. 12/01/23   Almira Jaeger, MD  vitamin C (ASCORBIC ACID) 500 MG tablet Take 500 mg by mouth daily in the afternoon.    [provider]    Physical Exam: BP (!) 154/76   Pulse 82   Temp 97.8 F (36.6 C) (Oral)   Resp 17   Ht 6' (1.829 m)   Wt (!) 170.1 kg   SpO2 99%   BMI 50.86 kg/m   General: 73 y.o. year-old male well developed well nourished in no acute distress.  Alert and oriented x3.  Cardiovascular: Regular rate and rhythm with no rubs or gallops.  No thyromegaly or JVD noted.  No lower extremity edema. 2/4 pulses in all 4 extremities. Respiratory: Clear to auscultation with no wheezes or rales. Good inspiratory effort. Abdomen: Soft nontender nondistended with normal bowel sounds x4 quadrants. Muskuloskeletal: No cyanosis, clubbing or edema noted bilaterally Neuro: CN II-XII intact, strength, sensation, reflexes Skin: Stasis dermatitis involving lower extremities bilaterally. Psychiatry: Judgement and insight appear normal. Mood is appropriate for condition and setting          Labs on Admission:  Basic Metabolic Panel: Recent Labs  Lab 12/14/23 1631  NA 136  K 4.1  CL 103  CO2 24  GLUCOSE 139*  BUN 19  CREATININE 1.12  CALCIUM  8.8*   Liver Function Tests: No results for input(s): "AST", "ALT", "ALKPHOS", "BILITOT", "PROT", "ALBUMIN" in the last 168 hours. No results for input(s): "LIPASE", "AMYLASE" in the last 168 hours. No results for input(s): "AMMONIA" in the last 168 hours. CBC: Recent Labs  Lab 12/14/23 1631  WBC 8.7  NEUTROABS 6.4  HGB 13.0   HCT 41.0  MCV 93.8  PLT 241   Cardiac Enzymes: No results for input(s): "CKTOTAL", "CKMB", "CKMBINDEX", "TROPONINI" in the last 168 hours.  BNP (last 3 results) No results for input(s): "BNP" in the last 8760 hours.  ProBNP (last 3 results) No results for input(s): "PROBNP" in the last 8760 hours.  CBG: No results for input(s): "GLUCAP" in the last 168 hours.  Radiological Exams on Admission: CT LUMBAR SPINE W CONTRAST Result Date: 12/14/2023 CLINICAL DATA:  Thoracic radiculopathy with infection suspected EXAM: CT THORACIC AND LUMBAR SPINE WITH CONTRAST TECHNIQUE: Multidetector CT imaging of the thoracic and lumbar spine was performed with intravenous contrast administration. RADIATION DOSE REDUCTION: This exam was performed according to the departmental dose-optimization program which includes automated exposure control, adjustment of the mA and/or kV according to patient size and/or use of iterative reconstruction technique. CONTRAST:  100mL OMNIPAQUE  IOHEXOL  300 MG/ML  SOLN COMPARISON:  None Available. FINDINGS: Alignment: Normal. Vertebrae: There are sclerotic changes at the T9-T12 levels. At T11-12, there is widening of the disc space with opposing endplate erosion, most consistent with discitis-osteomyelitis. The L4-L5 vertebral bodies are fused anteriorly. Lumbar segmentation is normal. Paraspinal and other soft tissues: Heterogeneous and mildly enlarged thyroid  gland. Calcific aortic atherosclerosis. Disc levels: There is moderate spinal canal stenosis at the T11-12 level. There is severe facet hypertrophy at the L2-5 levels. This contributes to moderate-to-severe foraminal stenosis at L2-S1, greatest at the L5-S1 level. There is moderate spinal canal stenosis at the L3-S1 levels due to combination of degenerative disc and facet disease. IMPRESSION: 1. Findings most consistent with discitis-osteomyelitis at T11-12. Acuity is uncertain. MRI with and without contrast may be helpful for  further characterization. 2. Moderate spinal canal stenosis at T11-12 and L3-S1. 3. Severe facet hypertrophy at the L2-5 levels with moderate-to-severe foraminal stenosis at L2-S1, greatest at the L5-S1 level. 4. Heterogeneous and mildly enlarged thyroid  gland. Recommend correlation with nonemergent thyroid  ultrasound. Aortic atherosclerosis (ICD10-I70.0). Electronically Signed   By: Juanetta Nordmann M.D.   On: 12/14/2023 22:49   CT THORACIC SPINE W CONTRAST Result Date: 12/14/2023 CLINICAL DATA:  Thoracic radiculopathy with infection suspected EXAM: CT THORACIC AND LUMBAR SPINE WITH CONTRAST TECHNIQUE: Multidetector CT imaging of the thoracic and lumbar spine was performed with intravenous contrast administration. RADIATION DOSE REDUCTION: This exam was performed according to the departmental dose-optimization program which includes automated exposure control, adjustment of the  mA and/or kV according to patient size and/or use of iterative reconstruction technique. CONTRAST:  OMNIPAQUE  IOHEXOL  300 MG/ML  SOLN COMPARISON:  None Available. FINDINGS: Alignment: Normal. Vertebrae: There are sclerotic changes at the T9-T12 levels. At T11-12, there is widening of the disc space with opposing endplate erosion, most consistent with discitis-osteomyelitis. The L4-L5 vertebral bodies are fused anteriorly. Lumbar segmentation is normal. Paraspinal and other soft tissues: Heterogeneous and mildly enlarged thyroid  gland. Calcific aortic atherosclerosis. Disc levels: There is moderate spinal canal stenosis at the T11-12 level. There is severe facet hypertrophy at the L2-5 levels. This contributes to moderate-to-severe foraminal stenosis at L2-S1, greatest at the L5-S1 level. There is moderate spinal canal stenosis at the L3-S1 levels due to combination of degenerative disc and facet disease. IMPRESSION: 1. Findings most consistent with discitis-osteomyelitis at T11-12. Acuity is uncertain. MRI with and without contrast may  be helpful for further characterization. 2. Moderate spinal canal stenosis at T11-12 and L3-S1. 3. Severe facet hypertrophy at the L2-5 levels with moderate-to-severe foraminal stenosis at L2-S1, greatest at the L5-S1 level. 4. Heterogeneous and mildly enlarged thyroid  gland. Recommend correlation with nonemergent thyroid  ultrasound. Aortic atherosclerosis (ICD10-I70.0). Electronically Signed   By: Juanetta Nordmann M.D.   On: 12/14/2023 22:49    EKG: I independently viewed the EKG done and my findings are as followed: None available at the time of this visit.  Assessment/Plan Present on Admission:  Osteomyelitis of thoracic vertebra (HCC)  Principal Problem:   Osteomyelitis of thoracic vertebra (HCC)  Osteomyelitis of thoracic vertebrae T11-12, POA Contrasted CT scan showed discitis osteomyelitis at T11-T12.  Acuity is not certain.  MRI with and without contrast ordered and pending  Continue IV vancomycin  and Rocephin  Follow peripheral blood cultures x 2 Consider infectious disease consultation in the morning.  Chronic back pain secondary to the above and as noted below Moderate spinal canal stenosis at T11-12 and L3-S1.   Severe facet hypertrophy at the L2-5 levels with moderate to severe foraminal stenosis at L2-S1, greatest at the L5-S1 level.    Incidental findings: Heterogeneous and mildly enlarged thyroid  gland.   Recommend correlation with nonemergent thyroid  ultrasound, per radiology report.  Follow TSH  Aortic atherosclerosis Resume home Lipitor   History of pulmonary embolism Resume home Xarelto   Status dermatitis Resume home regimen  Severe morbid obesity Recommend weight loss outpatient regular physical activity and healthy dieting.  Chronic HFpEF Euvolemic on exam Closely monitor volume status while on IV fluid Strict I's and O's and daily weight   Time: 75 minutes.   DVT prophylaxis: Home Xarelto .  Code Status: Full code.  Family Communication:  Granddaughter at bedside.  Disposition Plan: Admitted to telemetry unit.  Consults called: None.  Admission status: Inpatient status.   Status is: Inpatient The patient requires at least 2 midnights for further evaluation and treatment of present condition.   Bary Boss MD [facility] Pager [phone]  If 7PM-7AM, please contact night-coverage [email or web address] Password [email or web address]  12/15/2023, 1:15 AM

## 2023-12-15 NOTE — ED Provider Notes (Signed)
 Osteomyelitis of thoracic spine.  MRI pending.  Admission discussed with Dr. Del Favia.   Earma Gloss, MD 12/15/23 [phone]

## 2023-12-15 NOTE — Telephone Encounter (Signed)
 Called and spoke with patient in the hospital-asked him to passed along to daughter thank you for letting us  know

## 2023-12-15 NOTE — Progress Notes (Signed)
 ED Pharmacy Antibiotic Sign Off An antibiotic consult was received from an ED provider for Vancomcyin per pharmacy dosing for  discitis vs vertebral osteomyelitis.  A chart review was completed to assess appropriateness.   The following one time order(s) were placed:  Vancomycin  2g IV   Further antibiotic and/or antibiotic pharmacy consults should be ordered by the admitting provider if indicated.   Thank you for allowing pharmacy to be a part of this patient's care.   Rulon Councilman, [facility]  Clinical Pharmacist 12/15/23 12:09 AM

## 2023-12-15 NOTE — ED Notes (Signed)
 Patient refused to be stuck for 2nd Blood Cx.

## 2023-12-15 NOTE — Progress Notes (Signed)
 PROGRESS NOTE  Franklin Hicks  DOB: 1950/10/28  PCP: Almira Jaeger, MD ZOX:[id number]  DOA: 12/14/2023  LOS: 0 days  Hospital Day: 2  Brief narrative: Franklin Hicks is a 73 y.o. male with PMH significant for morbid obesity, DM2, HTN, HLD, CAD/stent, h/o recurrent PE on Xarelto , generalized arthritis.  In 2019, patient had cervical spine fracture after a fall and underwent surgical fixation. At baseline, patient uses a walker to ambulate. For the last few months, patient has developed progressively worsening back pain. In December, he was seen by his PCP and referred to orthopedics.  It seems he had CT renal study obtained at that time which suggested nonobstructing 4 mm calculus in the lower pole of the left kidney and diffuse degenerative disc disease of the lumbar spine. He was started on muscle relaxant and sent to PT. Patient continued to have pain but was manageable but lately it is progressive and he has had significant limitation in mobility. With worsening symptoms, patient presented to the ED 4/21.  In the ED, patient was afebrile, hemodynamically stable Labs showed WBC count 8.7, CRP only mildly elevated 1.3 Blood culture sent CT thoracic spine showed findings most consistent with discitis osteomyelitis at T11-T12.  Acuity is not certain.  Also showed moderate spinal canal stenosis at T11-12 and L3-S1.  Severe facet hypertrophy at the L2-5 levels with moderate to severe foraminal stenosis at L2-S1, greatest at the L5-S1 level.     Patient was started on IV antibiotic coverage with IV vancomycin  and IV Rocephin  Admitted to [facility]  MRI thoracic and lumbar spine showed - Little edema and enhancement at the level of T11-T12 disc widening and endplate destruction which is not present since CT scan on 07/2023 favoring neuropathic level or remote discitis - T8-T11-L4 S1 ankylosis - At least moderate spinal stenosis at T11-T12 with bilateral foraminal impingement involving T7-T8 to  T11-12   Subjective: Patient was seen and examined this morning.  Pleasant elderly African-American male.  Lying down in bed.  Not in distress.  We discussed the imaging findings.  Daughter at bedside.  Assessment and plan: Suspect osteomyelitis of thoracic vertebrae T11-12 Contrasted CT scan showed discitis osteomyelitis at T11-T12.  Acuity is not certain.  MRI showed that the findings are there since at least the CT scan from December 2024.  I discussed this with endings with Dr. Nat Badger who reviewed his imaging.  He states that the findings which are stable for last at least 4 months are less likely to be infection.  ESR and CRP only mildly elevated.  He has degenerative disc disease with destruction, stenosis and impingement at different levels. No surgical needs at this time.  If the patient truly has an infection and no other source is identified, he would suggest IR involvement for sampling. Blood cultures has been sent from ED. Currently on IV Rocephin  and IV vancomycin  Will start pain control with scheduled Tylenol , as needed oxycodone , PRN Dilaudid and scheduled Robaxin .  Continue Neurontin . Physical therapy eval  Type 2 diabetes mellitus A1c 7 on 12/01/2023 PTA meds-none Start SSI/Accu-Cheks No results for input(s): "GLUCAP" in the last 168 hours.  H/o recurrent PE Chronically on Xarelto    CAD/stent Aortic atherosclerosis HLD Continue aspirin , Xarelto , metoprolol , Lipitor   Chronic diastolic CHF HTN Currently euvolemic. PTA meds -metoprolol , terazosin ,  Morbid Obesity  Body mass index is 50.86 kg/m. Patient has been advised to make an attempt to improve diet and exercise patterns to aid in weight loss.  Thyroid   enlargement  CT scan also noted heterogeneous and mildly enlarged thyroid  gland.   Recommend correlation with nonemergent thyroid  ultrasound, per radiology report.    Mobility: PT eval ordered  Goals of care   Code Status: Full Code     DVT  prophylaxis:   rivaroxaban  (XARELTO ) tablet 20 mg   Antimicrobials: IV Rocephin  and IV vancomycin  Fluid: None of IV fluid Consultants: Neurosurgery on the phone Family Communication: Daughter at bedside  Status: Inpatient Level of care:  Telemetry   Patient is from: Home Needs to continue in-hospital care: Needs workup, IV antibiotics Anticipated d/c to: Pending clinical course    Diet:  Diet Order             Diet regular Room service appropriate? Yes; Fluid consistency: Thin  Diet effective now                   Scheduled Meds:  acetaminophen   1,000 mg Oral TID   aspirin  EC  81 mg Oral Daily   atorvastatin   80 mg Oral Daily   gabapentin   100 mg Oral TID   insulin  aspart  0-5 Units Subcutaneous QHS   insulin  aspart  0-9 Units Subcutaneous TID WC   methocarbamol   500 mg Oral TID   metoprolol  tartrate  50 mg Oral BID    morphine  injection  4 mg Intravenous Once   rivaroxaban   20 mg Oral Daily   tacrolimus    Topical BID   terazosin   10 mg Oral QHS    PRN meds: melatonin, morphine  injection, oxyCODONE , polyethylene glycol, prochlorperazine    Infusions:   cefTRIAXone  (ROCEPHIN )  IV     vancomycin       Antimicrobials: Anti-infectives (From admission, onward)    Start     Dose/Rate Route Frequency Ordered Stop   12/15/23 2300  cefTRIAXone  (ROCEPHIN ) 2 g in sodium chloride  0.9 % 100 mL IVPB        2 g 200 mL/hr over 30 Minutes Intravenous Every 24 hours 12/15/23 0111     12/15/23 1200  vancomycin  (VANCOREADY) IVPB 1250 mg/250 mL        1,250 mg 166.7 mL/hr over 90 Minutes Intravenous Every 12 hours 12/15/23 0116     12/15/23 0115  vancomycin  (VANCOREADY) IVPB 750 mg/150 mL  Status:  Discontinued        750 mg 150 mL/hr over 60 Minutes Intravenous Every 24 hours 12/15/23 0111 12/15/23 0116   12/14/23 2330  vancomycin  (VANCOREADY) IVPB 2000 mg/400 mL        2,000 mg 200 mL/hr over 120 Minutes Intravenous  Once 12/14/23 2316 12/15/23 0233   12/14/23 2315   cefTRIAXone  (ROCEPHIN ) 2 g in sodium chloride  0.9 % 100 mL IVPB        2 g 200 mL/hr over 30 Minutes Intravenous  Once 12/14/23 2308 12/15/23 0013       Objective: Vitals:   12/15/23 0729 12/15/23 1100  BP: (!) 163/92 136/81  Pulse: 85 89  Resp: 16 18  Temp:  98.3 F (36.8 C)  SpO2: 98% 97%    Intake/Output Summary (Last 24 hours) at 12/15/2023 1142 Last data filed at 12/15/2023 0233 Gross per 24 hour  Intake 500 ml  Output 300 ml  Net 200 ml   Filed Weights   12/14/23 1432  Weight: (!) 170.1 kg   Weight change:  Body mass index is 50.86 kg/m.   Physical Exam: General exam: Pleasant, elderly African-American male Skin: No rashes, lesions or ulcers. HEENT:  Atraumatic, normocephalic, no obvious bleeding Lungs: Clear to auscultation bilaterally,  CVS: S1, S2, no murmur,   GI/Abd: Soft, nontender, nondistended, bowel sound present,   CNS: Alert, awake, oriented x 3 Psychiatry: Seems overwhelmed Extremities: No pedal edema, no calf tenderness,   Data Review: I have personally reviewed the laboratory data and studies available.  F/u labs ordered Unresulted Labs (From admission, onward)     Start     Ordered   12/15/23 0113  Culture, blood (Routine X 2) w Reflex to ID Panel  BLOOD CULTURE X 2,   R      12/15/23 0112           Total time spent in review of labs and imaging, patient evaluation, formulation of plan, documentation and communication with family: 55 minutes  Signed, Hoyt Macleod, MD [facility] 12/15/2023

## 2023-12-15 NOTE — Telephone Encounter (Signed)
 FYI.  Copied from CRM [id number]. Topic: General - Other >> Dec 14, 2023  1:15 PM Kita Perish H wrote: Reason for CRM: Franklin Hicks patients daughter is calling to notify Dr. Arlene Ben that Franklin Hicks is on his way to a local hospital, he's having severe pain and Franklin Hicks wanted to inform proivder.  Franklin Hicks [phone]

## 2023-12-16 DIAGNOSIS — M4624 Osteomyelitis of vertebra, thoracic region: Secondary | ICD-10-CM | POA: Diagnosis not present

## 2023-12-16 LAB — GLUCOSE, CAPILLARY
Glucose-Capillary: 122 mg/dL — ABNORMAL HIGH (ref 70–99)
Glucose-Capillary: 153 mg/dL — ABNORMAL HIGH (ref 70–99)
Glucose-Capillary: 191 mg/dL — ABNORMAL HIGH (ref 70–99)
Glucose-Capillary: 126 mg/dL — ABNORMAL HIGH (ref 70–99)

## 2023-12-16 MED ORDER — SENNOSIDES-DOCUSATE SODIUM 8.6-50 MG PO TABS
1.0000 | ORAL_TABLET | Freq: Every day | ORAL | Status: DC
  Administered 2023-12-16 – 2023-12-20 (×5): 1 via ORAL
  Filled 2023-12-16 (×5): qty 1

## 2023-12-16 NOTE — Evaluation (Signed)
 Physical Therapy Evaluation Patient Details Name: Franklin Hicks MRN: [id number] DOB: 04-14-1951 Today's Date: 12/16/2023  History of Present Illness  Pt is 73 yo male admitted on 12/14/23 with intractable back pain initially suspected to have osteomyelitis of T11-T12. Pt has had pain for several months but progressively has worsened.  Neurosurgery consulted and no surgery indicated. Pt with hx including but not limited to morbid obesity, DM2, HTN, HLD, CAD, PE, arthritis.  Clinical Impression  Pt admitted with above diagnosis. At baseline, pt can ambulate in home with RW and perform ADLs (except socks).  He was able to ambulate longer distances up until November when back pain began.  Pt has DME and home support.  Today, pt was able to stand x 2 but from high perch position.  Did have assist of 2 for safety but only needed min A with bed elevated and use of rails.  Would need assist of 2 to advance mobility for safety/chair follow.  He had pain in mid back radiating to L side worse than R but did not have any spasms during session.  Performed slow transitions with back precautions for comfort and used bed elevated for standing.   Pt currently with functional limitations due to the deficits listed below (see PT Problem List). Pt will benefit from acute skilled PT to increase their independence and safety with mobility to allow discharge.  At current mobility would recommend Patient will benefit from continued inpatient follow up therapy, <3 hours/day ; however, pt very hopeful that with pain control can progress to home level with HHPT.          If plan is discharge home, recommend the following: A lot of help with walking and/or transfers;A lot of help with bathing/dressing/bathroom   Can travel by private vehicle   No    Equipment Recommendations Hospital bed  Recommendations for Other Services       Functional Status Assessment Patient has had a recent decline in their functional status and  demonstrates the ability to make significant improvements in function in a reasonable and predictable amount of time.     Precautions / Restrictions Precautions Precautions: Back;Fall      Mobility  Bed Mobility Overal bed mobility: Needs Assistance Bed Mobility: Rolling, Sidelying to Sit, Sit to Supine Rolling: Min assist, Used rails Sidelying to sit: HOB elevated, Used rails, Min assist   Sit to supine: Min assist, +2 for physical assistance   General bed mobility comments: Increased time and use of bed features    Transfers Overall transfer level: Needs assistance Equipment used: Rolling walker (2 wheels) Transfers: Sit to/from Stand Sit to Stand: Min assist, +2 safety/equipment, From elevated surface           General transfer comment: Pt performed 2 x STS with light min A of 2 but bed significantly elevated to high perch position.    Ambulation/Gait               General Gait Details: not able  Stairs            Wheelchair Mobility     Tilt Bed    Modified Rankin (Stroke Patients Only)       Balance Overall balance assessment: Needs assistance Sitting-balance support: Bilateral upper extremity supported Sitting balance-[name]: Fair Sitting balance - Comments: using UE but more for pain control (leaning forward on RW)   Standing balance support: Bilateral upper extremity supported Standing balance-[name]: Poor Standing balance comment: leaning forward,  CGA; use of RW                             Pertinent Vitals/Pain Pain Assessment Pain Assessment: 0-10 Pain Score: 4  Pain Location: back and radiating in flank; L worse than R - reports is tolerable as long as he doesn't spasm Pain Descriptors / Indicators: Radiating Pain Intervention(s): Limited activity within patient's tolerance, Monitored during session, Premedicated before session, Repositioned    Home Living Family/patient expects to be discharged to::  Private residence Living Arrangements: Other (Comment) (adult granddaughter; 2 sisters) Available Help at Discharge: Family;Available 24 hours/day Type of Home: House       Alternate Level Stairs-Number of Steps: Can use a ramp outside and enter on top level and does not have to do any steps Home Layout: Two level;Bed/bath upstairs;1/2 bath on main level Home Equipment: Grab bars - tub/shower;Shower Counsellor (2 wheels);Cane - single point;Wheelchair - manual      Prior Function Prior Level of Function : Needs assist             Mobility Comments: Could ambulate with RW; reports up until November 2024 could walk up ramp (~100') but with increased back pain now not as far ADLs Comments: Independent with ADLs except donning socks     Extremity/Trunk Assessment   Upper Extremity Assessment Upper Extremity Assessment: Overall WFL for tasks assessed    Lower Extremity Assessment Lower Extremity Assessment: Overall WFL for tasks assessed    Cervical / Trunk Assessment Cervical / Trunk Assessment: Other exceptions Cervical / Trunk Exceptions: Back pain - leans forward  and R to relief.  Reports pain in mid back and radiates to bil flank, L worse than R.  Pt with palpable muscle tension/knots L flank. Tolerated cross friction soft tissue mobilization well.  Reports the pain is tolerable as long as he doesn't start having spasms.  Reports little change with heat or ice.  Communication        Cognition Arousal: Alert Behavior During Therapy: WFL for tasks assessed/performed   PT - Cognitive impairments: No apparent impairments                                 Cueing       General Comments      Exercises     Assessment/Plan    PT Assessment Patient needs continued PT services  PT Problem List Decreased strength;Pain;Decreased range of motion;Decreased activity tolerance;Decreased knowledge of use of DME;Decreased balance;Decreased safety  awareness;Decreased mobility       PT Treatment Interventions DME instruction;Therapeutic exercise;Wheelchair mobility training;Gait training;Balance training;Manual techniques;Neuromuscular re-education;Modalities;Functional mobility training;Therapeutic activities;Patient/family education    PT Goals (Current goals can be found in the Care Plan section)  Acute Rehab PT Goals Patient Stated Goal: decrease pain and spasms in order to return home    Frequency Min 3X/week     Co-evaluation               AM-PAC PT "6 Clicks" Mobility  Outcome Measure Help needed turning from your back to your side while in a flat bed without using bedrails?: A Lot Help needed moving from lying on your back to sitting on the side of a flat bed without using bedrails?: A Lot Help needed moving to and from a bed to a chair (including a wheelchair)?: Total Help needed standing up from a  chair using your arms (e.g., wheelchair or bedside chair)?: Total Help needed to walk in hospital room?: Total Help needed climbing 3-5 steps with a railing? : Total 6 Click Score: 8    End of Session Equipment Utilized During Treatment: Gait belt Activity Tolerance: Patient limited by pain Patient left: in bed;with call bell/phone within reach;with family/visitor present Nurse Communication: Mobility status PT Visit Diagnosis: Other abnormalities of gait and mobility (R26.89);Muscle weakness (generalized) (M62.81);Pain Pain - part of body:  (back)    Time: 0102-7253 PT Time Calculation (min) (ACUTE ONLY): 43 min   Charges:   PT Evaluation $PT Eval Low Complexity: 1 Low PT Treatments $Therapeutic Activity: 23-37 mins PT General Charges $$ ACUTE PT VISIT: 1 Visit         Cyd Dowse, PT Acute Rehab [facility] [phone]   Carolynn Citrin 12/16/2023, 3:48 PM

## 2023-12-16 NOTE — Progress Notes (Signed)
 PROGRESS NOTE  Franklin Hicks  DOB: 11/29/1950  PCP: Almira Jaeger, MD ZOX:[id number]  DOA: 12/14/2023  LOS: 1 day  Hospital Day: 3  Brief narrative: Franklin Hicks is a 73 y.o. male with PMH significant for morbid obesity, DM2, HTN, HLD, CAD/stent, h/o recurrent PE on Xarelto , generalized arthritis.  In 2019, patient had cervical spine fracture after a fall and underwent surgical fixation. At baseline, patient uses a walker to ambulate. For the last few months, patient has developed progressively worsening back pain. In December, he was seen by his PCP and referred to orthopedics.  It seems he had CT renal study obtained at that time which suggested nonobstructing 4 mm calculus in the lower pole of the left kidney and diffuse degenerative disc disease of the lumbar spine. He was started on muscle relaxant and sent to PT. Patient continued to have pain but was manageable but lately it is progressive and he has had significant limitation in mobility. With worsening symptoms, patient presented to the ED 4/21.  In the ED, patient was afebrile, hemodynamically stable Labs showed WBC count 8.7, CRP only mildly elevated 1.3 Blood culture sent CT thoracic spine showed findings most consistent with discitis osteomyelitis at T11-T12.  Acuity is not certain.  Also showed moderate spinal canal stenosis at T11-12 and L3-S1.  Severe facet hypertrophy at the L2-5 levels with moderate to severe foraminal stenosis at L2-S1, greatest at the L5-S1 level.     Patient was started on IV antibiotic coverage with IV vancomycin  and IV Rocephin  Admitted to [facility]  MRI thoracic and lumbar spine showed - Little edema and enhancement at the level of T11-T12 disc widening and endplate destruction which is not present since CT scan on 07/2023 favoring neuropathic level or remote discitis - T8-T11-L4 S1 ankylosis - At least moderate spinal stenosis at T11-T12 with bilateral foraminal impingement involving T7-T8 to  T11-12   Subjective: Patient was seen and examined this morning.   Daughter at bedside. Propped up in bed.  Spasm improving but he is afraid to participate with physical therapy because of anticipated pain and spasm   Assessment and plan: Intractable back pain  Initially suspected to have osteomyelitis of thoracic vertebrae T11-12. Contrasted CT scan showed discitis osteomyelitis at T11-T12.  Acuity is not certain.  MRI showed that the findings are there since at least the CT scan from December 2024.   4/22, I discussed the findings with neurosurgeon Dr. Nat Badger who reviewed his imaging.  He states that the findings which are stable for last at least 4 months are less likely to be infection.  ESR and CRP only mildly elevated.  Patient has degenerative disc disease with destruction, stenosis and impingement at different levels. No surgical needs at this time.  If the patient truly has an infection and no other source is identified, neurosurgery would suggest IR involvement for sampling. Blood cultures has been sent from ED. pending report.  No fever.  WC count normal For now, I will continue on empiric IV Rocephin  and IV vancomycin  Will start pain control with scheduled Tylenol , as needed oxycodone , PRN Dilaudid and scheduled Robaxin .  Continue Neurontin . Physical therapy eval pending.  Abdominal pain Patient also reports bilateral flank pain on trying to sit up. 07/2023 CT renal study had shown a nonobstructive 4 mm calculus in the lower pole of the left kidney. Subsequent imaging since then has not shown any intra-abdominal pathology. Could be constipation due to impaired mobility.  I would start on  bowel regimen with scheduled Senokot and as needed MiraLAX .  Type 2 diabetes mellitus A1c 7 on 12/01/2023 PTA meds-none Start SSI/Accu-Cheks Recent Labs  Lab 12/15/23 1204 12/15/23 1729 12/15/23 2141 12/16/23 0758  GLUCAP 98 149* 170* 122*   H/o recurrent PE Chronically on Xarelto     CAD/stent Aortic atherosclerosis HLD Continue aspirin , Xarelto , metoprolol , Lipitor   Chronic diastolic CHF HTN Currently euvolemic. Continue metoprolol , terazosin ,  Morbid Obesity  Body mass index is 49.87 kg/m. Patient has been advised to make an attempt to improve diet and exercise patterns to aid in weight loss.  Thyroid  enlargement  CT scan also noted heterogeneous and mildly enlarged thyroid  gland.   Recommend correlation with nonemergent thyroid  ultrasound, per radiology report.    Mobility: PT eval ordered  Goals of care   Code Status: Full Code     DVT prophylaxis:  rivaroxaban  (XARELTO ) tablet 20 mg Start: 12/15/23 1000 rivaroxaban  (XARELTO ) tablet 20 mg   Antimicrobials: IV Rocephin  and IV vancomycin  Fluid: None of IV fluid Consultants: Neurosurgery on the phone Family Communication: Daughter at bedside  Status: Inpatient Level of care:  Telemetry   Patient is from: Home Needs to continue in-hospital care: Pending PT eval Anticipated d/c to: Pending clinical course and PT recommendation    Diet:  Diet Order             Diet regular Room service appropriate? Yes; Fluid consistency: Thin  Diet effective now                   Scheduled Meds:  acetaminophen   1,000 mg Oral TID   aspirin  EC  81 mg Oral Daily   atorvastatin   80 mg Oral Daily   gabapentin   100 mg Oral TID   insulin  aspart  0-5 Units Subcutaneous QHS   insulin  aspart  0-9 Units Subcutaneous TID WC   methocarbamol   500 mg Oral TID   metoprolol  tartrate  50 mg Oral BID    morphine  injection  4 mg Intravenous Once   rivaroxaban   20 mg Oral Daily   senna-docusate  1 tablet Oral QHS   tacrolimus    Topical BID   terazosin   10 mg Oral QHS    PRN meds: melatonin, morphine  injection, mouth rinse, oxyCODONE , polyethylene glycol, prochlorperazine    Infusions:   cefTRIAXone  (ROCEPHIN )  IV Stopped (12/15/23 2236)   vancomycin  Stopped (12/16/23 0106)     Antimicrobials: Anti-infectives (From admission, onward)    Start     Dose/Rate Route Frequency Ordered Stop   12/15/23 2300  cefTRIAXone  (ROCEPHIN ) 2 g in sodium chloride  0.9 % 100 mL IVPB        2 g 200 mL/hr over 30 Minutes Intravenous Every 24 hours 12/15/23 0111     12/15/23 1200  vancomycin  (VANCOREADY) IVPB 1250 mg/250 mL        1,250 mg 166.7 mL/hr over 90 Minutes Intravenous Every 12 hours 12/15/23 0116     12/15/23 0115  vancomycin  (VANCOREADY) IVPB 750 mg/150 mL  Status:  Discontinued        750 mg 150 mL/hr over 60 Minutes Intravenous Every 24 hours 12/15/23 0111 12/15/23 0116   12/14/23 2330  vancomycin  (VANCOREADY) IVPB 2000 mg/400 mL        2,000 mg 200 mL/hr over 120 Minutes Intravenous  Once 12/14/23 2316 12/15/23 0233   12/14/23 2315  cefTRIAXone  (ROCEPHIN ) 2 g in sodium chloride  0.9 % 100 mL IVPB        2 g 200 mL/hr over  30 Minutes Intravenous  Once 12/14/23 2308 12/15/23 0013       Objective: Vitals:   12/16/23 0426 12/16/23 0956  BP: (!) 151/70 (!) 129/55  Pulse: 80 72  Resp: 14 18  Temp: 98.4 F (36.9 C) 97.7 F (36.5 C)  SpO2: 95% 94%    Intake/Output Summary (Last 24 hours) at 12/16/2023 1141 Last data filed at 12/16/2023 1005 Gross per 24 hour  Intake 2051.78 ml  Output 900 ml  Net 1151.78 ml   Filed Weights   12/14/23 1432 12/16/23 0710  Weight: (!) 170.1 kg (!) 166.8 kg   Weight change:  Body mass index is 49.87 kg/m.   Physical Exam: General exam: Pleasant, elderly African-American male.  Morbidly obese Skin: No rashes, lesions or ulcers. HEENT: Atraumatic, normocephalic, no obvious bleeding Lungs: Clear to auscultation bilaterally,  CVS: S1, S2, no murmur,   GI/Abd: Soft, mild tenderness on both flank, nondistended, bowel sound present,   CNS: Alert, awake, oriented x 3 Psychiatry: Sad affect, seems anxious Extremities: No pedal edema, no calf tenderness,   Data Review: I have personally reviewed the laboratory data and  studies available.  F/u labs ordered Unresulted Labs (From admission, onward)     Start     Ordered   12/17/23 0500  Basic metabolic panel with GFR  Tomorrow morning,   R        12/16/23 0833   12/17/23 0500  CBC with Differential/Platelet  Tomorrow morning,   R        12/16/23 0833           Total time spent in review of labs and imaging, patient evaluation, formulation of plan, documentation and communication with family: 45 minutes  Signed, Hoyt Macleod, MD [facility] 12/16/2023

## 2023-12-16 NOTE — Plan of Care (Signed)
  Problem: Education: Goal: Ability to describe self-care measures that may prevent or decrease complications (Diabetes Survival Skills Education) will improve Outcome: Progressing   Problem: Coping: Goal: Ability to adjust to condition or change in health will improve Outcome: Progressing   Problem: Fluid Volume: Goal: Ability to maintain a balanced intake and output will improve Outcome: Progressing   Problem: Health Behavior/Discharge Planning: Goal: Ability to identify and utilize available resources and services will improve Outcome: Progressing Goal: Ability to manage health-related needs will improve Outcome: Progressing   Problem: Metabolic: Goal: Ability to maintain appropriate glucose levels will improve Outcome: Progressing   Problem: Nutritional: Goal: Maintenance of adequate nutrition will improve Outcome: Progressing Goal: Progress toward achieving an optimal weight will improve Outcome: Progressing   Problem: Skin Integrity: Goal: Risk for impaired skin integrity will decrease Outcome: Progressing   Problem: Tissue Perfusion: Goal: Adequacy of tissue perfusion will improve Outcome: Progressing   Problem: Health Behavior/Discharge Planning: Goal: Ability to manage health-related needs will improve Outcome: Progressing   Problem: Clinical Measurements: Goal: Ability to maintain clinical measurements within normal limits will improve Outcome: Progressing Goal: Will remain free from infection Outcome: Progressing Goal: Diagnostic test results will improve Outcome: Progressing Goal: Respiratory complications will improve Outcome: Progressing Goal: Cardiovascular complication will be avoided Outcome: Progressing   Problem: Activity: Goal: Risk for activity intolerance will decrease Outcome: Progressing   Problem: Nutrition: Goal: Adequate nutrition will be maintained Outcome: Progressing   Problem: Coping: Goal: Level of anxiety will  decrease Outcome: Progressing   Problem: Elimination: Goal: Will not experience complications related to bowel motility Outcome: Progressing Goal: Will not experience complications related to urinary retention Outcome: Progressing   Problem: Pain Managment: Goal: General experience of comfort will improve and/or be controlled Outcome: Progressing   Problem: Safety: Goal: Ability to remain free from injury will improve Outcome: Progressing   Problem: Skin Integrity: Goal: Risk for impaired skin integrity will decrease Outcome: Progressing

## 2023-12-16 NOTE — Progress Notes (Signed)
   12/16/23 1326  TOC Brief Assessment  Insurance and Status Reviewed  Patient has primary care physician Yes Arlene Ben, Saverio Curling, MD)  Home environment has been reviewed from home  Prior level of function: Modified independent uses walker to ambulate  Social Drivers of Health Review SDOH reviewed no interventions necessary  Readmission risk has been reviewed Yes  Transition of care needs no transition of care needs at this time

## 2023-12-17 DIAGNOSIS — M4624 Osteomyelitis of vertebra, thoracic region: Secondary | ICD-10-CM | POA: Diagnosis not present

## 2023-12-17 LAB — CBC WITH DIFFERENTIAL/PLATELET
Abs Immature Granulocytes: 0.03 10*3/uL (ref 0.00–0.07)
Basophils Absolute: 0 10*3/uL (ref 0.0–0.1)
Basophils Relative: 0 %
Eosinophils Absolute: 0.2 10*3/uL (ref 0.0–0.5)
Eosinophils Relative: 3 %
HCT: 37.8 % — ABNORMAL LOW (ref 39.0–52.0)
Hemoglobin: 11.9 g/dL — ABNORMAL LOW (ref 13.0–17.0)
Immature Granulocytes: 0 %
Lymphocytes Relative: 34 %
Lymphs Abs: 2.3 10*3/uL (ref 0.7–4.0)
MCH: 29.8 pg (ref 26.0–34.0)
MCHC: 31.5 g/dL (ref 30.0–36.0)
MCV: 94.5 fL (ref 80.0–100.0)
Monocytes Absolute: 0.6 10*3/uL (ref 0.1–1.0)
Monocytes Relative: 8 %
Neutro Abs: 3.7 10*3/uL (ref 1.7–7.7)
Neutrophils Relative %: 55 %
Platelets: 213 10*3/uL (ref 150–400)
RBC: 4 MIL/uL — ABNORMAL LOW (ref 4.22–5.81)
RDW: 14.4 % (ref 11.5–15.5)
WBC: 6.8 10*3/uL (ref 4.0–10.5)
nRBC: 0 % (ref 0.0–0.2)

## 2023-12-17 LAB — BASIC METABOLIC PANEL WITH GFR
Anion gap: 9 (ref 5–15)
BUN: 18 mg/dL (ref 8–23)
CO2: 24 mmol/L (ref 22–32)
Calcium: 8.2 mg/dL — ABNORMAL LOW (ref 8.9–10.3)
Chloride: 102 mmol/L (ref 98–111)
Creatinine, Ser: 0.95 mg/dL (ref 0.61–1.24)
GFR, Estimated: >60 mL/min (ref >60)
Glucose, Bld: 140 mg/dL — ABNORMAL HIGH (ref 70–99)
Potassium: 4.1 mmol/L (ref 3.5–5.1)
Sodium: 135 mmol/L (ref 135–145)

## 2023-12-17 LAB — GLUCOSE, CAPILLARY
Glucose-Capillary: 124 mg/dL — ABNORMAL HIGH (ref 70–99)
Glucose-Capillary: 178 mg/dL — ABNORMAL HIGH (ref 70–99)
Glucose-Capillary: 133 mg/dL — ABNORMAL HIGH (ref 70–99)
Glucose-Capillary: 115 mg/dL — ABNORMAL HIGH (ref 70–99)

## 2023-12-17 MED ORDER — METHOCARBAMOL 500 MG PO TABS
750.0000 mg | ORAL_TABLET | Freq: Three times a day (TID) | ORAL | Status: AC
  Administered 2023-12-17 (×2): 750 mg via ORAL
  Filled 2023-12-17 (×2): qty 2

## 2023-12-17 NOTE — Progress Notes (Signed)
   Inpatient Rehab Admissions Coordinator :  Per therapy change in recommendations, patient was screened for CIR candidacy by Ottie Glazier RN MSN.  At this time patient appears to be a potential candidate for CIR. I will place a rehab consult per protocol for full assessment. Please call me with any questions.  Ottie Glazier RN MSN Admissions Coordinator [phone]

## 2023-12-17 NOTE — Progress Notes (Signed)
 OT Cancellation Note  Patient Details Name: DATHAN ATTIA MRN: [id number] DOB: 12-May-1951   Cancelled Treatment:    Reason Eval/Treat Not Completed: Other (comment). The pt is working with PT. Will continue efforts to complete OT eval.   Sheralyn Dies, OTR/L 12/17/2023, 5:13 PM

## 2023-12-17 NOTE — Progress Notes (Signed)
 Physical Therapy Treatment Patient Details Name: Franklin Hicks MRN: [id number] DOB: 11-06-1950 Today's Date: 12/17/2023   History of Present Illness Pt is 73 yo male admitted on 12/14/23 with intractable back pain initially suspected to have osteomyelitis of T11-T12. Pt has had pain for several months but progressively has worsened.  Neurosurgery consulted and no surgery indicated. Pt with hx including but not limited to morbid obesity, DM2, HTN, HLD, CAD, PE, arthritis.    PT Comments  Pt premedicated for session today and reports pain improved 2/10 and able to tolerate activity better.  Pt stood at bedside x4 and performed pregait activity of marching in place 20 steps each time.  Pt took short seated rest break in-between.  Pt putting forth good effort and motivated to participate.  Pt interested in CIR (states he has been there before with good results).       If plan is discharge home, recommend the following: A little help with walking and/or transfers;A little help with bathing/dressing/bathroom   Can travel by private vehicle        Equipment Recommendations  Hospital bed    Recommendations for Other Services       Precautions / Restrictions Precautions Precautions: Back;Fall Restrictions Weight Bearing Restrictions Per Provider Order: No     Mobility  Bed Mobility Overal bed mobility: Needs Assistance Bed Mobility: Supine to Sit, Sit to Supine     Supine to sit: Contact guard, HOB elevated, Used rails Sit to supine: Min assist, HOB elevated, Used rails   General bed mobility comments: performs his own technique for pain control, elevated HOB, heavy use of bed rail to self assist; light assist for LEs onto bed    Transfers Overall transfer level: Needs assistance Equipment used: Rolling walker (2 wheels)   Sit to Stand: Min assist, From elevated surface           General transfer comment: performed sit to stands x4 with marching in place 20 steps each time,  reliant on elevated bed to rise; maintains increased trunk flexion for pain control    Ambulation/Gait               General Gait Details: pt likely able to ambulate at this time however fearful of spasms so remained near bed today   Stairs             Wheelchair Mobility     Tilt Bed    Modified Rankin (Stroke Patients Only)       Balance Overall balance assessment: Needs assistance         Standing balance support: Bilateral upper extremity supported Standing balance-[name]: Poor Standing balance comment: leaning forward, CGA; use of RW                            Communication Communication Communication: No apparent difficulties  Cognition Arousal: Alert Behavior During Therapy: WFL for tasks assessed/performed   PT - Cognitive impairments: No apparent impairments                                Cueing    Exercises      General Comments        Pertinent Vitals/Pain Pain Assessment Pain Assessment: 0-10 Pain Score: 2  Pain Location: back Pain Descriptors / Indicators: Aching Pain Intervention(s): Monitored during session, Repositioned, Premedicated before session    Home Living  Prior Function            PT Goals (current goals can now be found in the care plan section) Progress towards PT goals: Progressing toward goals    Frequency    Min 3X/week      PT Plan      Co-evaluation              AM-PAC PT "6 Clicks" Mobility   Outcome Measure  Help needed turning from your back to your side while in a flat bed without using bedrails?: A Little Help needed moving from lying on your back to sitting on the side of a flat bed without using bedrails?: A Little Help needed moving to and from a bed to a chair (including a wheelchair)?: A Little Help needed standing up from a chair using your arms (e.g., wheelchair or bedside chair)?: A Lot Help needed to walk in  hospital room?: A Lot Help needed climbing 3-5 steps with a railing? : A Lot 6 Click Score: 15    End of Session Equipment Utilized During Treatment: Gait belt Activity Tolerance: Patient tolerated treatment well Patient left: in bed;with call bell/phone within reach;with family/visitor present Nurse Communication: Mobility status PT Visit Diagnosis: Other abnormalities of gait and mobility (R26.89);Muscle weakness (generalized) (M62.81)     Time: 1610-9604 PT Time Calculation (min) (ACUTE ONLY): 35 min  Charges:    $Gait Training: 23-37 mins PT General Charges $$ ACUTE PT VISIT: 1 Visit                     Henretta Lodge PT, DPT Physical Therapist Acute Rehabilitation Services Office: [phone]    Myna Asal Payson 12/17/2023, 12:17 PM

## 2023-12-17 NOTE — TOC Initial Note (Signed)
 Transition of Care [facility]) - Initial/Assessment Note    Patient Details  Name: Franklin Hicks MRN: [id number] Date of Birth: 1951-04-24  Transition of Care [facility]) CM/SW Contact:    Amaryllis Junior, LCSW Phone Number: 12/17/2023, 3:08 PM  Clinical Narrative:                 Pt recommended for IP rehab. CIR consulted and will review. TOC following.  Expected Discharge Plan: IP Rehab Facility Barriers to Discharge: Continued Medical Work up   Patient Goals and CMS Choice Patient states their goals for this hospitalization and ongoing recovery are:: return home following STR CMS Medicare.gov Compare Post Acute Care list provided to::  (NA) Choice offered to / list presented to : NA [place] ownership interest in [facility].provided to::  (NA)    Expected Discharge Plan and Services In-house Referral: NA   Post Acute Care Choice: IP Rehab                   DME Arranged: N/A DME Agency: NA       HH Arranged: NA HH Agency: NA        Prior Living Arrangements/Services   Lives with:: Relatives Patient language and need for interpreter reviewed:: Yes Do you feel safe going back to the place where you live?: Yes      Need for Family Participation in Patient Care: Yes (Comment) Care giver support system in place?: Yes (comment)   Criminal Activity/Legal Involvement Pertinent to Current Situation/Hospitalization: No - Comment as needed  Activities of Daily Living   ADL Screening (condition at time of admission) Independently performs ADLs?: No Does the patient have a NEW difficulty with bathing/dressing/toileting/self-feeding that is expected to last >3 days?: Yes (Initiates electronic notice to provider for possible OT consult) Does the patient have a NEW difficulty with getting in/out of bed, walking, or climbing stairs that is expected to last >3 days?: Yes (Initiates electronic notice to provider for possible PT consult) Does the patient have a NEW difficulty  with communication that is expected to last >3 days?: No Is the patient deaf or have difficulty hearing?: No Does the patient have difficulty seeing, even when wearing glasses/contacts?: No Does the patient have difficulty concentrating, remembering, or making decisions?: No  Permission Sought/Granted                  Emotional Assessment Appearance:: Appears stated age Attitude/Demeanor/Rapport: Engaged Affect (typically observed): Accepting Orientation: : Oriented to Place, Oriented to Self, Oriented to Situation, Oriented to  Time Alcohol / Substance Use: Not Applicable Psych Involvement: No (comment)  Admission diagnosis:  Osteomyelitis of thoracic vertebra (HCC) [M46.24] Osteomyelitis of thoracic region [facility]) [M46.24] Low back pain without sciatica, unspecified back pain laterality, unspecified chronicity [M54.50] Patient Active Problem List   Diagnosis Date Noted   Osteomyelitis of thoracic vertebra (HCC) 12/15/2023   Cholelithiases 08/09/2023   Fatty liver 08/09/2023   Peripheral arterial disease (HCC) 08/09/2023   Prostatic enlargement 08/09/2023   DDD (degenerative disc disease), lumbar 08/09/2023   Chronic kidney disease, stage 3a (HCC) 07/17/2023   Preoperative cardiovascular examination 02/05/2021   Recurrent pulmonary embolism (HCC)    Pain of left heel    C4 spinal cord injury, sequela (HCC) 12/18/2017   Tetraplegia (HCC) 12/18/2017   Aortic aneurysm (HCC) 12/16/2017   Cord compression (HCC) 12/13/2017   Chronic combined systolic and diastolic heart failure (HCC) 10/09/2016   CAD S/P percutaneous coronary angioplasty 09/20/2016  Primary osteoarthritis of knees, bilateral 01/11/2016   Erectile dysfunction 07/30/2015   Morbid obesity (HCC) 07/18/2013   Angioedema of lips 07/13/2013   Multinodular goiter 07/13/2013   Hyperlipidemia associated with type 2 diabetes mellitus (HCC) 10/02/2011   SOB (shortness of breath) 10/02/2011   BPH with urinary  obstruction 07/17/2010   Diabetes mellitus type 2, controlled (HCC) 07/17/2010   Bilateral lower extremity edema 12/08/2008   Edema 12/08/2008   NEUROPATHY, IDIOPATHIC PERIPHERAL NEC 05/31/2007   Osteoarthritis 03/22/2007   Gout 03/15/2007   Essential hypertension 03/15/2007   PCP:  Almira Jaeger, MD Pharmacy:   [facility] [phone] Jonette Nestle, [place] - [address] [place] [address] Phone: [phone] Fax: [phone]  [facility] [place] - [id number] [phone] [address] [place] [address] Phone: [phone] Fax: [phone]     Social Drivers of Health (SDOH) Social History: SDOH Screenings   Food Insecurity: No Food Insecurity (12/15/2023)  Housing: Low Risk  (12/15/2023)  Transportation Needs: No Transportation Needs (12/15/2023)  Utilities: Not At Risk (12/15/2023)  Depression (PHQ2-9): Low Risk  (09/16/2023)  Financial Resource Strain: Low Risk  (09/16/2023)  Physical Activity: Insufficiently Active (09/16/2023)  Social Connections: Moderately Integrated (12/15/2023)  Stress: No Stress Concern Present (09/16/2023)  Tobacco Use: Medium Risk (12/14/2023)  Health Literacy: Adequate Health Literacy (09/16/2023)   SDOH Interventions:     Readmission Risk Interventions    12/17/2023    3:05 PM  Readmission Risk Prevention Plan  Post Dischage Appt Complete  Medication Screening Complete  Transportation Screening Complete

## 2023-12-17 NOTE — Plan of Care (Signed)
  Problem: Education: Goal: Ability to describe self-care measures that may prevent or decrease complications (Diabetes Survival Skills Education) will improve Outcome: Progressing   Problem: Coping: Goal: Ability to adjust to condition or change in health will improve Outcome: Progressing   Problem: Fluid Volume: Goal: Ability to maintain a balanced intake and output will improve Outcome: Progressing   Problem: Health Behavior/Discharge Planning: Goal: Ability to identify and utilize available resources and services will improve Outcome: Progressing Goal: Ability to manage health-related needs will improve Outcome: Progressing   Problem: Metabolic: Goal: Ability to maintain appropriate glucose levels will improve Outcome: Progressing   Problem: Nutritional: Goal: Maintenance of adequate nutrition will improve Outcome: Progressing Goal: Progress toward achieving an optimal weight will improve Outcome: Progressing   Problem: Skin Integrity: Goal: Risk for impaired skin integrity will decrease Outcome: Progressing   Problem: Tissue Perfusion: Goal: Adequacy of tissue perfusion will improve Outcome: Progressing   Problem: Health Behavior/Discharge Planning: Goal: Ability to manage health-related needs will improve Outcome: Progressing   Problem: Clinical Measurements: Goal: Ability to maintain clinical measurements within normal limits will improve Outcome: Progressing Goal: Will remain free from infection Outcome: Progressing Goal: Diagnostic test results will improve Outcome: Progressing Goal: Respiratory complications will improve Outcome: Progressing Goal: Cardiovascular complication will be avoided Outcome: Progressing   Problem: Activity: Goal: Risk for activity intolerance will decrease Outcome: Progressing   Problem: Nutrition: Goal: Adequate nutrition will be maintained Outcome: Progressing   Problem: Coping: Goal: Level of anxiety will  decrease Outcome: Progressing   Problem: Elimination: Goal: Will not experience complications related to bowel motility Outcome: Progressing Goal: Will not experience complications related to urinary retention Outcome: Progressing   Problem: Pain Managment: Goal: General experience of comfort will improve and/or be controlled Outcome: Progressing   Problem: Safety: Goal: Ability to remain free from injury will improve Outcome: Progressing   Problem: Skin Integrity: Goal: Risk for impaired skin integrity will decrease Outcome: Progressing

## 2023-12-17 NOTE — Progress Notes (Signed)
 PROGRESS NOTE  Franklin Hicks  DOB: 22-Apr-1951  PCP: Almira Jaeger, MD ZOX:[id number]  DOA: 12/14/2023  LOS: 2 days  Hospital Day: 4  Brief narrative: Franklin Hicks is a 73 y.o. male with PMH significant for morbid obesity, DM2, HTN, HLD, CAD/stent, h/o recurrent PE on Xarelto , generalized arthritis.  In 2019, patient had cervical spine fracture after a fall and underwent surgical fixation. At baseline, patient uses a walker to ambulate. For the last few months, patient has developed progressively worsening back pain. In December, he was seen by his PCP and referred to orthopedics.  It seems he had CT renal study obtained at that time which suggested nonobstructing 4 mm calculus in the lower pole of the left kidney and diffuse degenerative disc disease of the lumbar spine. He was started on muscle relaxant and sent to PT. Patient continued to have pain but was manageable but lately it is progressive and he has had significant limitation in mobility. With worsening symptoms, patient presented to the ED 4/21.  In the ED, patient was afebrile, hemodynamically stable Labs showed WBC count 8.7, CRP only mildly elevated 1.3 Blood culture sent CT thoracic spine showed findings most consistent with discitis osteomyelitis at T11-T12.  Acuity is not certain.  Also showed moderate spinal canal stenosis at T11-12 and L3-S1.  Severe facet hypertrophy at the L2-5 levels with moderate to severe foraminal stenosis at L2-S1, greatest at the L5-S1 level.     Patient was started on IV antibiotic coverage with IV vancomycin  and IV Rocephin  Admitted to TRH  MRI thoracic and lumbar spine showed - Little edema and enhancement at the level of T11-T12 disc widening and endplate destruction which is not present since CT scan on 07/2023 favoring neuropathic level or remote discitis - T8-T11-L4 S1 ankylosis - At least moderate spinal stenosis at T11-T12 with bilateral foraminal impingement involving T7-T8 to  T11-12   Subjective: Patient was seen and examined this afternoon. Lying on bed.  Not in distress.  Pain gradually improving. Daughter at bedside. Antibiotics stopped yesterday.  No fever.  WC count remains normal Pending CIR.  Assessment and plan: Intractable back pain  Unlikely to be osteomyelitis of thoracic vertebrae T11-12. Contrasted CT scan showed discitis osteomyelitis at T11-T12.  Acuity is not certain.  MRI showed that the findings are there since at least the CT scan from December 2024.   4/22, I discussed the findings with neurosurgeon Dr. Nat Badger who reviewed his imaging.  He states that the findings which are stable for last at least 4 months are less likely to be infection.  ESR and CRP only mildly elevated.  Patient has degenerative disc disease with destruction, stenosis and impingement at different levels. No surgical needs at this time.  If the patient truly has an infection and no other source is identified, neurosurgery would suggest IR involvement for sampling. Patient was started on pain management with scheduled Tylenol , as needed oxycodone , as needed Dilaudid and scheduled Robaxin .  Increase dose of Robaxin  to 750 mg 3 times daily. Continue Neurontin . PT eval obtained.  CIR recommended.  Blood cultures did not show any growth.  Initially patient was started on empiric antibiotic coverage with IV Rocephin  and IV vancomycin . 4/23, antibiotics were stopped.  No fever.  WBC count remains normal Recent Labs  Lab 12/14/23 1631 12/15/23 0300 12/17/23 0400  WBC 8.7 6.3 6.8   Abdominal pain Patient also reports bilateral flank pain on trying to sit up. 07/2023 CT renal study had  shown a nonobstructive 4 mm calculus in the lower pole of the left kidney. Subsequent imaging since then has not shown any intra-abdominal pathology. Could be constipation due to impaired mobility.  I would start on bowel regimen with scheduled Senokot and as needed MiraLAX .  Type 2  diabetes mellitus A1c 7 on 12/01/2023 PTA meds-none Start SSI/Accu-Cheks Recent Labs  Lab 12/16/23 1152 12/16/23 1657 12/16/23 2144 12/17/23 0716 12/17/23 1131  GLUCAP 153* 191* 126* 124* 178*   H/o recurrent PE Chronically on Xarelto    CAD/stent Aortic atherosclerosis HLD Continue aspirin , Xarelto , metoprolol , Lipitor   Chronic diastolic CHF Frequent PVCs/NSVT HTN Currently euvolemic. Continue metoprolol , terazosin , Electrolytes normal.  Obtain echocardiogram Recent Labs  Lab 12/14/23 1631 12/15/23 0300 12/17/23 0400  K 4.1 3.5 4.1  MG  --  2.0  --   PHOS  --  3.4  --     Morbid Obesity  Body mass index is 51.25 kg/m. Patient has been advised to make an attempt to improve diet and exercise patterns to aid in weight loss.  Thyroid  enlargement  CT scan also noted heterogeneous and mildly enlarged thyroid  gland.   Recommend correlation with nonemergent thyroid  ultrasound, per radiology report.    Mobility: PT eval ordered  Goals of care   Code Status: Full Code     DVT prophylaxis:  rivaroxaban  (XARELTO ) tablet 20 mg Start: 12/15/23 1000 rivaroxaban  (XARELTO ) tablet 20 mg   Antimicrobials: None Fluid: None of IV fluid Consultants: Neurosurgery on the phone Family Communication: Daughter at bedside  Status: Inpatient Level of care:  Telemetry   Patient is from: Home Needs to continue in-hospital care: Pending echo, pending insurance authorizing for CIR. Anticipated d/c to: CIR eventually    Diet:  Diet Order             Diet regular Room service appropriate? Yes; Fluid consistency: Thin  Diet effective now                   Scheduled Meds:  acetaminophen   1,000 mg Oral TID   aspirin  EC  81 mg Oral Daily   atorvastatin   80 mg Oral Daily   gabapentin   100 mg Oral TID   insulin  aspart  0-5 Units Subcutaneous QHS   insulin  aspart  0-9 Units Subcutaneous TID WC   methocarbamol   500 mg Oral TID   metoprolol  tartrate  50 mg Oral BID     morphine  injection  4 mg Intravenous Once   rivaroxaban   20 mg Oral Daily   senna-docusate  1 tablet Oral QHS   tacrolimus    Topical BID   terazosin   10 mg Oral QHS    PRN meds: melatonin, morphine  injection, mouth rinse, oxyCODONE , polyethylene glycol, prochlorperazine    Infusions:     Antimicrobials: Anti-infectives (From admission, onward)    Start     Dose/Rate Route Frequency Ordered Stop   12/15/23 2300  cefTRIAXone  (ROCEPHIN ) 2 g in sodium chloride  0.9 % 100 mL IVPB  Status:  Discontinued        2 g 200 mL/hr over 30 Minutes Intravenous Every 24 hours 12/15/23 0111 12/16/23 1155   12/15/23 1200  vancomycin  (VANCOREADY) IVPB 1250 mg/250 mL  Status:  Discontinued        1,250 mg 166.7 mL/hr over 90 Minutes Intravenous Every 12 hours 12/15/23 0116 12/16/23 1155   12/15/23 0115  vancomycin  (VANCOREADY) IVPB 750 mg/150 mL  Status:  Discontinued        750 mg 150 mL/hr over  60 Minutes Intravenous Every 24 hours 12/15/23 0111 12/15/23 0116   12/14/23 2330  vancomycin  (VANCOREADY) IVPB 2000 mg/400 mL        2,000 mg 200 mL/hr over 120 Minutes Intravenous  Once 12/14/23 2316 12/15/23 0233   12/14/23 2315  cefTRIAXone  (ROCEPHIN ) 2 g in sodium chloride  0.9 % 100 mL IVPB        2 g 200 mL/hr over 30 Minutes Intravenous  Once 12/14/23 2308 12/15/23 0013       Objective: Vitals:   12/16/23 2143 12/17/23 0447  BP: (!) 130/59 (!) 147/72  Pulse: 71 70  Resp: 18 18  Temp: 97.9 F (36.6 C) 97.8 F (36.6 C)  SpO2: 98% 97%    Intake/Output Summary (Last 24 hours) at 12/17/2023 1414 Last data filed at 12/17/2023 0955 Gross per 24 hour  Intake 900 ml  Output 800 ml  Net 100 ml   Filed Weights   12/14/23 1432 12/16/23 0710 12/17/23 0451  Weight: (!) 170.1 kg (!) 166.8 kg (!) 171.4 kg   Weight change:  Body mass index is 51.25 kg/m.   Physical Exam: General exam: Pleasant, elderly African-American male.  Morbidly obese.  Not in distress Skin: No rashes, lesions or  ulcers. HEENT: Atraumatic, normocephalic, no obvious bleeding Lungs: Clear to auscultation bilaterally,  CVS: S1, S2, no murmur,   GI/Abd: Soft, mild tenderness on both flank, nondistended, bowel sound present,   CNS: Alert, awake, oriented x 3 Psychiatry: Sad affec Extremities: No pedal edema, no calf tenderness,   Data Review: I have personally reviewed the laboratory data and studies available.  F/u labs ordered Unresulted Labs (From admission, onward)    None      Total time spent in review of labs and imaging, patient evaluation, formulation of plan, documentation and communication with family: 45 minutes  Signed, Hoyt Macleod, MD [facility] 12/17/2023

## 2023-12-17 NOTE — Progress Notes (Signed)
 Notified MD pt had 12 beats of Vtach. No new order.

## 2023-12-18 ENCOUNTER — Inpatient Hospital Stay (HOSPITAL_COMMUNITY)

## 2023-12-18 DIAGNOSIS — R9431 Abnormal electrocardiogram [ECG] [EKG]: Secondary | ICD-10-CM | POA: Diagnosis not present

## 2023-12-18 DIAGNOSIS — M4624 Osteomyelitis of vertebra, thoracic region: Secondary | ICD-10-CM | POA: Diagnosis not present

## 2023-12-18 LAB — CBC WITH DIFFERENTIAL/PLATELET
Abs Immature Granulocytes: 0.02 10*3/uL (ref 0.00–0.07)
Basophils Absolute: 0 10*3/uL (ref 0.0–0.1)
Basophils Relative: 0 %
Eosinophils Absolute: 0.3 10*3/uL (ref 0.0–0.5)
Eosinophils Relative: 4 %
HCT: 38.9 % — ABNORMAL LOW (ref 39.0–52.0)
Hemoglobin: 12.1 g/dL — ABNORMAL LOW (ref 13.0–17.0)
Immature Granulocytes: 0 %
Lymphocytes Relative: 31 %
Lymphs Abs: 2 10*3/uL (ref 0.7–4.0)
MCH: 29.7 pg (ref 26.0–34.0)
MCHC: 31.1 g/dL (ref 30.0–36.0)
MCV: 95.6 fL (ref 80.0–100.0)
Monocytes Absolute: 0.6 10*3/uL (ref 0.1–1.0)
Monocytes Relative: 9 %
Neutro Abs: 3.7 10*3/uL (ref 1.7–7.7)
Neutrophils Relative %: 56 %
Platelets: 222 10*3/uL (ref 150–400)
RBC: 4.07 MIL/uL — ABNORMAL LOW (ref 4.22–5.81)
RDW: 14.4 % (ref 11.5–15.5)
WBC: 6.6 10*3/uL (ref 4.0–10.5)
nRBC: 0 % (ref 0.0–0.2)

## 2023-12-18 LAB — BASIC METABOLIC PANEL WITH GFR
Anion gap: 9 (ref 5–15)
BUN: 17 mg/dL (ref 8–23)
CO2: 25 mmol/L (ref 22–32)
Calcium: 8.2 mg/dL — ABNORMAL LOW (ref 8.9–10.3)
Chloride: 100 mmol/L (ref 98–111)
Creatinine, Ser: 0.97 mg/dL (ref 0.61–1.24)
GFR, Estimated: >60 mL/min (ref >60)
Glucose, Bld: 128 mg/dL — ABNORMAL HIGH (ref 70–99)
Potassium: 4 mmol/L (ref 3.5–5.1)
Sodium: 134 mmol/L — ABNORMAL LOW (ref 135–145)

## 2023-12-18 LAB — ECHOCARDIOGRAM COMPLETE
Area-P 1/2: 3.31 cm2
Height: 72 in
S' Lateral: 4 cm
Weight: 6017.68 [oz_av]

## 2023-12-18 LAB — GLUCOSE, CAPILLARY
Glucose-Capillary: 120 mg/dL — ABNORMAL HIGH (ref 70–99)
Glucose-Capillary: 120 mg/dL — ABNORMAL HIGH (ref 70–99)
Glucose-Capillary: 102 mg/dL — ABNORMAL HIGH (ref 70–99)
Glucose-Capillary: 145 mg/dL — ABNORMAL HIGH (ref 70–99)

## 2023-12-18 MED ORDER — MAGNESIUM HYDROXIDE 400 MG/5ML PO SUSP
30.0000 mL | Freq: Every day | ORAL | Status: DC
  Filled 2023-12-18: qty 30

## 2023-12-18 NOTE — Progress Notes (Signed)
 Physical Medicine & Rehabilitation Consult Service  Pt discussed with rehab admissions coordinator. Chart has been reviewed. This is a 73 year old male known to me with a history of morbid obesity as well as prior cervical spine fracture status post fusion back in 2019 after a fall.  Over the last few months the patient describes increasing low back pain.  His pain has been progressive limiting his mobility.  He had a course of PT after degenerative disc disease was seen in his lumbar spine.  He came to the ED on 12/14/2023 with increasing pain and back spasms.  CT of the thoracic spine demonstrated finding consistent with osteomyelitis at T11-T12 of unknown acuity.  Also showed moderate spinal canal stenosis at T11-T12 and L3-S1.  He also has severe facet hypertrophy and multiple levels of stenosis.  MRI was ultimately performed and did not demonstrate findings consistent with osteomyelitis.  Neurosurgery was consulted and agreed that this was likely not an active osteomyelitis or discitis.  Pain management was recommended as well as aggressive therapy.  Patient was up with therapies yesterday and reporting improved pain at least with them.  He was min assist to contact-guard assist for supine to sit and sit to stand transfers.  He took 20 steps while marching at the edge of the bed.  Patient was afraid to take steps away from the bed inferior of his back spasming.  Patient lives at home with his adult granddaughter and 2 sisters.  He has a two-level house with bedroom bathroom upstairs.  Apparently he can enter on the top level via a ramp but does not have to do any steps.  Patient was using a rolling walker and ambulating up to 100 feet until November 2024 but is now not walking as far due to increased pain.   Home: Home Living Family/patient expects to be discharged to:: Private residence Living Arrangements: Other (Comment) (adult granddaughter; 2 sisters) Available Help at Discharge: Family, Available  24 hours/day Type of Home: House Home Layout: Two level, Bed/bath upstairs, 1/2 bath on main level Alternate Level Stairs-Number of Steps: Can use a ramp outside and enter on top level and does not have to do any steps Bathroom Shower/Tub: Health visitor: Handicapped height (counter next to toilet) Home Equipment: Grab bars - tub/shower, Shower seat, Agricultural consultant (2 wheels), [employer] - single point, Wheelchair - manual  Functional History: Prior Function Prior Level of Function : Needs assist Mobility Comments: Could ambulate with RW; reports up until November 2024 could walk up ramp (~100') but with increased back pain now not as far ADLs Comments: Independent with ADLs except donning socks Functional Status:  Mobility: Bed Mobility Overal bed mobility: Needs Assistance Bed Mobility: Supine to Sit, Sit to Supine Rolling: Min assist, Used rails Sidelying to sit: HOB elevated, Used rails, Min assist Supine to sit: Contact guard, HOB elevated, Used rails Sit to supine: Min assist, HOB elevated, Used rails General bed mobility comments: performs his own technique for pain control, elevated HOB, heavy use of bed rail to self assist; light assist for LEs onto bed Transfers Overall transfer level: Needs assistance Equipment used: Rolling walker (2 wheels) Transfers: Sit to/from Stand Sit to Stand: Min assist, From elevated surface General transfer comment: performed sit to stands x4 with marching in place 20 steps each time, reliant on elevated bed to rise; maintains increased trunk flexion for pain control Ambulation/Gait General Gait Details: pt likely able to ambulate at this time however fearful of spasms so  remained near bed today    ADL:    Cognition:   Cognition Arousal: Alert Behavior During Therapy: WFL for tasks assessed/performed   Assessment: 73 year old male with chronic but acutely increasing low back pain.  His imaging is notable for significant  degenerative disc disease and stenosis as well as ankylosis from T8-S1.  He does not appear to have active osteomyelitis or discitis   Plan:  This patient would benefit from acute inpatient rehab to address pain management regimen, fxnl mobility and ADL's. Additionally, the patient requires daily MD oversight of the active medical issues noted above. Projected goals would be mod I to supervision with an ELOS of 7 days.  Dispo and social supports are appropriate.    Rehab Admissions Coordinator to follow up.    Rawland Caddy, MD, [facility] Physical Medicine & Rehabilitation Medical Director Rehabilitation Services 12/18/2023

## 2023-12-18 NOTE — Progress Notes (Signed)
*  PRELIMINARY RESULTS* Echocardiogram 2D Echocardiogram has been performed.  Franklin Hicks 12/18/2023, 2:59 PM

## 2023-12-18 NOTE — Progress Notes (Signed)
 PROGRESS NOTE  Franklin Hicks  DOB: [birth year]  PCP: Almira Jaeger, MD ZOX:[id number]  DOA: 12/14/2023  LOS: 3 days  Hospital Day: 5  Brief narrative: Franklin Hicks is a 73 y.o. male with PMH significant for morbid obesity, DM2, HTN, HLD, CAD/stent, h/o recurrent PE on Xarelto , generalized arthritis.  In 2019, patient had cervical spine fracture after a fall and underwent surgical fixation. At baseline, patient uses a walker to ambulate. For the last few months, patient has developed progressively worsening back pain. In December, he was seen by his PCP and referred to orthopedics.  It seems he had CT renal study obtained at that time which suggested nonobstructing 4 mm calculus in the lower pole of the left kidney and diffuse degenerative disc disease of the lumbar spine. He was started on muscle relaxant and sent to PT. Patient continued to have pain but was manageable but lately it is progressive and he has had significant limitation in mobility. With worsening symptoms, patient presented to the ED 4/21.  In the ED, patient was afebrile, hemodynamically stable Labs showed WBC count 8.7, CRP only mildly elevated 1.3 Blood culture sent CT thoracic spine showed findings most consistent with discitis osteomyelitis at T11-T12.  Acuity is not certain.  Also showed moderate spinal canal stenosis at T11-12 and L3-S1.  Severe facet hypertrophy at the L2-5 levels with moderate to severe foraminal stenosis at L2-S1, greatest at the L5-S1 level.     Patient was started on IV antibiotic coverage with IV vancomycin  and IV Rocephin  Admitted to [facility]  MRI thoracic and lumbar spine showed - Little edema and enhancement at the level of T11-T12 disc widening and endplate destruction which is not present since CT scan on 07/2023 favoring neuropathic level or remote discitis - T8-T11-L4 S1 ankylosis - At least moderate spinal stenosis at T11-T12 with bilateral foraminal impingement involving T7-T8 to  T11-12  Case was discussed with neurosurgery. Not suspected to be infection.  No surgical intervention recommended. PT recommended CIR.  Pending insurance approval   Subjective: Patient was seen and examined this morning. Lying down in bed.  Trying to have a bowel movement on bedpan. Daughter at bedside. Pending CIR.  Assessment and plan: Intractable back pain  Unlikely to be osteomyelitis of thoracic vertebrae T11-12. CT scan showed discitis osteomyelitis at T11-T12.   MRI also reported these findings but per radiology, these findings are there since at least the CT scan from December 2024.   4/22, I discussed the findings with neurosurgeon Dr. Nat Badger who reviewed his imagings.  He stated that the findings which are stable for last at least 4 months are less likely to be infection.  ESR and CRP only mildly elevated.   No surgical needs at this time.   Initially started on empiric antibiotics.  But in the absence of leukocytosis or  fever, antibiotics were stopped.  Patient has remained stable without antibiotics for last 2 days.  Imagings also showed  degenerative disc disease with destruction, stenosis and impingement at different levels.  These findings are likely because of persistent back pain. Currently on pain control with scheduled Tylenol , scheduled Robaxin -750 milligram 3 times daily, as needed oxycodone , as needed Dilaudid.  Continue Neurontin . PT eval obtained.  CIR recommended.  Blood cultures did not show any growth.  Initially patient was started on empiric antibiotic coverage with IV Rocephin  and IV vancomycin . 4/23, antibiotics were stopped.  No fever.  WBC count remains normal Recent Labs  Lab 12/14/23 1631  12/15/23 0300 12/17/23 0400 12/18/23 0321  WBC 8.7 6.3 6.8 6.6   Abdominal pain Constipation Patient also reports bilateral flank pain on trying to sit up. 07/2023 CT renal study had shown a nonobstructive 4 mm calculus in the lower pole of the left  kidney. Subsequent imaging since then has not shown any intra-abdominal pathology. Could be constipation due to impaired mobility. Currently on scheduled Senokot and as needed MiraLAX .  Was struggling to have a bowel movement this morning.  For the next 3 days, I ordered for scheduled milk of magnesia.  If successful with 1 dose, can stop subsequent dose.  Type 2 diabetes mellitus A1c 7 on 12/01/2023 PTA meds-none Start SSI/Accu-Cheks Recent Labs  Lab 12/17/23 0716 12/17/23 1131 12/17/23 1631 12/17/23 2123 12/18/23 0714  GLUCAP 124* 178* 133* 115* 120*   H/o recurrent PE Chronically on Xarelto    CAD/stent Aortic atherosclerosis HLD Continue aspirin , Xarelto , metoprolol , Lipitor   Chronic diastolic CHF Frequent PVCs/NSVT HTN Currently euvolemic. Continue metoprolol , terazosin , Electrolytes normal.  Obtain echocardiogram Recent Labs  Lab 12/14/23 1631 12/15/23 0300 12/17/23 0400 12/18/23 0321  K 4.1 3.5 4.1 4.0  MG  --  2.0  --   --   PHOS  --  3.4  --   --     Morbid Obesity  Body mass index is 51.01 kg/m. Patient has been advised to make an attempt to improve diet and exercise patterns to aid in weight loss.  Thyroid  enlargement  CT scan also noted heterogeneous and mildly enlarged thyroid  gland.   Recommend correlation with nonemergent thyroid  ultrasound, per radiology report.    Mobility: PT eval obtained.  CIR recommended  Goals of care   Code Status: Full Code     DVT prophylaxis:  rivaroxaban  (XARELTO ) tablet 20 mg Start: 12/15/23 1000 rivaroxaban  (XARELTO ) tablet 20 mg   Antimicrobials: None Fluid: Not on IV fluid Consultants: Neurosurgery on the phone Family Communication: Daughter at bedside  Status: Inpatient Level of care:  Telemetry   Patient is from: Home Needs to continue in-hospital care: Pending echo, pending insurance authorizing for CIR. Anticipated d/c to: CIR eventually    Diet:  Diet Order             Diet regular Room  service appropriate? Yes; Fluid consistency: Thin  Diet effective now                   Scheduled Meds:  acetaminophen   1,000 mg Oral TID   aspirin  EC  81 mg Oral Daily   atorvastatin   80 mg Oral Daily   gabapentin   100 mg Oral TID   insulin  aspart  0-5 Units Subcutaneous QHS   insulin  aspart  0-9 Units Subcutaneous TID WC   magnesium  hydroxide  30 mL Oral Daily   metoprolol  tartrate  50 mg Oral BID    morphine  injection  4 mg Intravenous Once   rivaroxaban   20 mg Oral Daily   senna-docusate  1 tablet Oral QHS   tacrolimus    Topical BID   terazosin   10 mg Oral QHS    PRN meds: melatonin, morphine  injection, mouth rinse, oxyCODONE , polyethylene glycol, prochlorperazine    Infusions:     Antimicrobials: Anti-infectives (From admission, onward)    Start     Dose/Rate Route Frequency Ordered Stop   12/15/23 2300  cefTRIAXone  (ROCEPHIN ) 2 g in sodium chloride  0.9 % 100 mL IVPB  Status:  Discontinued        2 g 200 mL/hr over 30  Minutes Intravenous Every 24 hours 12/15/23 0111 12/16/23 1155   12/15/23 1200  vancomycin  (VANCOREADY) IVPB 1250 mg/250 mL  Status:  Discontinued        1,250 mg 166.7 mL/hr over 90 Minutes Intravenous Every 12 hours 12/15/23 0116 12/16/23 1155   12/15/23 0115  vancomycin  (VANCOREADY) IVPB 750 mg/150 mL  Status:  Discontinued        750 mg 150 mL/hr over 60 Minutes Intravenous Every 24 hours 12/15/23 0111 12/15/23 0116   12/14/23 2330  vancomycin  (VANCOREADY) IVPB 2000 mg/400 mL        2,000 mg 200 mL/hr over 120 Minutes Intravenous  Once 12/14/23 2316 12/15/23 0233   12/14/23 2315  cefTRIAXone  (ROCEPHIN ) 2 g in sodium chloride  0.9 % 100 mL IVPB        2 g 200 mL/hr over 30 Minutes Intravenous  Once 12/14/23 2308 12/15/23 0013       Objective: Vitals:   12/17/23 2127 12/18/23 0526  BP: (!) 154/69 139/64  Pulse: (!) 55 63  Resp: 17 18  Temp: 98.3 F (36.8 C) 97.8 F (36.6 C)  SpO2: 96% 96%    Intake/Output Summary (Last 24 hours) at  12/18/2023 1051 Last data filed at 12/18/2023 0720 Gross per 24 hour  Intake 570 ml  Output 950 ml  Net -380 ml   Filed Weights   12/16/23 0710 12/17/23 0451 12/18/23 0500  Weight: (!) 166.8 kg (!) 171.4 kg (!) 170.6 kg   Weight change: 3.8 kg Body mass index is 51.01 kg/m.   Physical Exam: General exam: Pleasant, elderly African-American male.  Morbidly obese.  Not in distress Skin: No rashes, lesions or ulcers. HEENT: Atraumatic, normocephalic, no obvious bleeding Lungs: Clear to auscultation bilaterally,  CVS: S1, S2, no murmur,   GI/Abd: Soft, mild tenderness on both flank, distant from obesity, bowel sound present,   CNS: Alert, awake, oriented x 3 Psychiatry: Sad affect Extremities: No pedal edema, no calf tenderness,   Data Review: I have personally reviewed the laboratory data and studies available.  F/u labs ordered Unresulted Labs (From admission, onward)    None      Total time spent in review of labs and imaging, patient evaluation, formulation of plan, documentation and communication with family: 45 minutes  Signed, Hoyt Macleod, MD [facility] 12/18/2023

## 2023-12-18 NOTE — Evaluation (Signed)
 Occupational Therapy Evaluation Patient Details Name: Franklin Hicks MRN: [id number] DOB: May 18, 1951 Today's Date: 12/18/2023   History of Present Illness   Pt is 73 yr old male admitted on 12/14/23 with intractable back pain, initially suspected to have osteomyelitis of T11-T12. Pt has had pain for several months but progressively has worsened.  Neurosurgery consulted and no surgery indicated. Pt with hx including but not limited to obesity, DM2, HTN, HLD, CAD, PE, arthritis.     Clinical Impressions The pt is currently presenting below his baseline level of functioning for self-care management, as he is limited by the below listed deficits (see OT problem list). At current, he requires min assist for bed mobility, mod assist for lower body dressing, and min assist to stand from an elevated surface using a RW. He reported having moderate L low back/flank pain, described as a "knot" and spasms. Once in standing, he presented with slower, guarded movements, as well as forward flexed posture, notably present when he took a few lateral steps along the edge of the bed. He will benefit from further OT services to maximize his independence with self-care tasks, and to decrease the risk for restricted participation in meaningful activities. Anticipate pt to be a great inpatient rehab candidate, given his good family support, good rehab potential, and personal motivation to achieve maximal functional independence.      If plan is discharge home, recommend the following:   Assistance with cooking/housework;Help with stairs or ramp for entrance;A lot of help with bathing/dressing/bathroom;A little help with walking and/or transfers     Functional Status Assessment   Patient has had a recent decline in their functional status and demonstrates the ability to make significant improvements in function in a reasonable and predictable amount of time.     Equipment Recommendations   Other (comment) (defer  to next level of care)     Recommendations for Other Services         Precautions/Restrictions   Precautions Precautions: Back;Fall Restrictions Weight Bearing Restrictions Per Provider Order: No     Mobility Bed Mobility Overal bed mobility: Needs Assistance Bed Mobility: Supine to Sit, Sit to Supine     Supine to sit: Contact guard, HOB elevated, Used rails Sit to supine: HOB elevated, Used rails, Contact guard assist        Transfers Overall transfer level: Needs assistance Equipment used: Rolling walker (2 wheels) Transfers: Sit to/from Stand Sit to Stand: Min assist, From elevated surface                  Balance       Sitting balance - Comments: static sitting-good. dynamic sitting-fair+     Standing balance-[name]: Poor           ADL either performed or assessed with clinical judgement   ADL Overall ADL's : Needs assistance/impaired Eating/Feeding: Independent;Sitting   Grooming: Set up;Sitting   Upper Body Bathing: Set up;Sitting   Lower Body Bathing: Moderate assistance;Sitting/lateral leans Lower Body Bathing Details (indicate cue type and reason): based on clinical judgement Upper Body Dressing : Set up;Sitting Upper Body Dressing Details (indicate cue type and reason): simulated seated EOB Lower Body Dressing: Moderate assistance;Sitting/lateral leans Lower Body Dressing Details (indicate cue type and reason): Pt limited by L low back and flank pain which worsens with flexing at the hips                     Vision Baseline Vision/History: 1 Wears glasses  Additional Comments: he correctly read the time depicted on the wall clock            Pertinent Vitals/Pain Pain Assessment Pain Assessment: 0-10 Pain Score: 5  Pain Location: L flank Pain Descriptors / Indicators: Spasm, Tightness Pain Intervention(s): Limited activity within patient's tolerance, Monitored during session, Repositioned      Extremity/Trunk Assessment Upper Extremity Assessment Upper Extremity Assessment: Right hand dominant;LUE deficits/detail;RUE deficits/detail RUE Deficits / Details: chronic shoulder AROM limitations, otherwise AROM for elbow and hand WFL. Functional grip strength LUE Deficits / Details: chronic shoulder AROM limitations, otherwise AROM for elbow and hand WFL. Functional grip strength   Lower Extremity Assessment Lower Extremity Assessment: Overall WFL for tasks assessed       Communication Communication Communication: No apparent difficulties   Cognition Arousal: Alert Behavior During Therapy: WFL for tasks assessed/performed               OT - Cognition Comments: Oriented x4, able to follow commands                 Following commands: Intact                  Home Living Family/patient expects to be discharged to:: Private residence Living Arrangements:  (granddaughter, [place] and 2 sister in [place], therapeutic) Available Help at Discharge: Family (multiple family members) Type of Home: House Home Access: Ramped entrance     Home Layout: Two level;Able to live on main level with bedroom/bathroom Alternate Level Stairs-Number of Steps: Can use a ramp outside and enter on top level and does not have to do any steps   Bathroom Shower/Tub: Producer, television/film/video: Handicapped height   Home Equipment: Shower seat - built Charity fundraiser (2 wheels);Wheelchair - manual   Additional Comments: power scooter  Lives With: Family    Prior Functioning/Environment               Mobility Comments: Could ambulate with RW; reports up until November 2024 could walk up ramp (~100') but with increased back pain now not as far ADLs Comments: Independent with ADLs except donning socks. Family managed cooking and cleaning. He drives.     OT Problem List: Decreased activity tolerance;Impaired balance (sitting and/or standing);Decreased knowledge of use of DME or  AE;Pain   OT Treatment/Interventions: Self-care/ADL training;Therapeutic exercise;Therapeutic activities;Energy conservation;DME and/or AE instruction;Patient/family education;Balance training      OT Goals(Current goals can be found in the care plan section)   Acute Rehab OT Goals Patient Stated Goal: decreased pain and to be as mobile as possible OT Goal Formulation: With patient Time For Goal Achievement: 01/01/24 Potential to Achieve Goals: Good ADL Goals Pt Will Perform Lower Body Dressing: with supervision;with adaptive equipment;sitting/lateral leans;sit to/from stand Pt Will Transfer to Toilet: with supervision;ambulating Pt Will Perform Toileting - Clothing Manipulation and hygiene: with supervision;sit to/from stand   OT Frequency:  Min 2X/week       AM-PAC OT "6 Clicks" Daily Activity     Outcome Measure Help from another person eating meals?: None Help from another person taking care of personal grooming?: A Little Help from another person toileting, which includes using toliet, bedpan, or urinal?: A Lot Help from another person bathing (including washing, rinsing, drying)?: A Lot Help from another person to put on and taking off regular upper body clothing?: A Little Help from another person to put on and taking off regular lower body clothing?: A Lot 6 Click Score: 16  End of Session Equipment Utilized During Treatment: Rolling walker (2 wheels) Nurse Communication: Mobility status  Activity Tolerance: Patient limited by pain Patient left: in bed;with call bell/phone within reach  OT Visit Diagnosis: Unsteadiness on feet (R26.81);Other abnormalities of gait and mobility (R26.89);Pain Pain - part of body:  (L low back/flank)                Time: 1308-6578 OT Time Calculation (min): 21 min Charges:  OT General Charges $OT Visit: 1 Visit OT Evaluation $OT Eval Moderate Complexity: 1 Mod    Labrina Lines L Damarien Nyman, OTR/L 12/18/2023, 4:48 PM

## 2023-12-18 NOTE — Progress Notes (Signed)
 Physical Therapy Treatment Patient Details Name: Franklin Hicks MRN: [id number] DOB: 08/31/1950 Today's Date: 12/18/2023   History of Present Illness Pt is 73 yo male admitted on 12/14/23 with intractable back pain initially suspected to have osteomyelitis of T11-T12. Pt has had pain for several months but progressively has worsened.  Neurosurgery consulted and no surgery indicated. Pt with hx including but not limited to morbid obesity, DM2, HTN, HLD, CAD, PE, arthritis.    PT Comments  Pt reports "knot" today but states pain has improved.  Pt again performed sit to stands and taking steps (marching in place, pregait activity) and able to progress number of steps each time (see below for more details).  Pt putting forth good effort and hopeful for d/c to CIR.    If plan is discharge home, recommend the following: A little help with walking and/or transfers;A little help with bathing/dressing/bathroom   Can travel by private vehicle        Equipment Recommendations  Hospital bed    Recommendations for Other Services       Precautions / Restrictions Precautions Precautions: Back;Fall     Mobility  Bed Mobility Overal bed mobility: Needs Assistance Bed Mobility: Supine to Sit, Sit to Supine     Supine to sit: Contact guard, HOB elevated, Used rails Sit to supine: HOB elevated, Used rails, Contact guard assist   General bed mobility comments: performs his own technique for pain control, elevated HOB, heavy use of bed rail to self assist    Transfers Overall transfer level: Needs assistance Equipment used: Rolling walker (2 wheels) Transfers: Sit to/from Stand Sit to Stand: Min assist, From elevated surface           General transfer comment: performed sit to stands x6, reliant on elevated bed to rise and min assist while transitioning hands to RW; maintains increased trunk flexion for pain control; performed one sit stand and reports "knot" in back so returned to sitting;  then performed sit to stand x5 with taking steps each time (marching in place) (17, 24, 30, 35, 40 steps); requiring seated rest break between stands    Ambulation/Gait               General Gait Details: pt likely able to ambulate at this time however fearful of spasms so remained near bed today (would prefer +2 for chair follow as well)   Stairs             Wheelchair Mobility     Tilt Bed    Modified Rankin (Stroke Patients Only)       Balance           Standing balance support: Bilateral upper extremity supported Standing balance-[name]: Poor Standing balance comment: leaning forward, CGA; use of RW                            Communication Communication Communication: No apparent difficulties  Cognition Arousal: Alert Behavior During Therapy: WFL for tasks assessed/performed   PT - Cognitive impairments: No apparent impairments                                Cueing    Exercises      General Comments        Pertinent Vitals/Pain Pain Assessment Pain Assessment: 0-10 Pain Score: 5  Pain Location: back (report not spasming currently but  has a "knot" today) Pain Descriptors / Indicators: Aching, Sore, Grimacing, Guarding Pain Intervention(s): Repositioned, Monitored during session, Premedicated before session    Home Living                          Prior Function            PT Goals (current goals can now be found in the care plan section) Progress towards PT goals: Progressing toward goals    Frequency    Min 3X/week      PT Plan      Co-evaluation              AM-PAC PT "6 Clicks" Mobility   Outcome Measure  Help needed turning from your back to your side while in a flat bed without using bedrails?: A Little Help needed moving from lying on your back to sitting on the side of a flat bed without using bedrails?: A Little Help needed moving to and from a bed to a chair  (including a wheelchair)?: A Little Help needed standing up from a chair using your arms (e.g., wheelchair or bedside chair)?: A Lot Help needed to walk in hospital room?: A Lot Help needed climbing 3-5 steps with a railing? : A Lot 6 Click Score: 15    End of Session Equipment Utilized During Treatment: Gait belt Activity Tolerance: Patient tolerated treatment well Patient left: in bed;with call bell/phone within reach   PT Visit Diagnosis: Other abnormalities of gait and mobility (R26.89);Muscle weakness (generalized) (M62.81)     Time: 1610-9604 PT Time Calculation (min) (ACUTE ONLY): 34 min  Charges:    $Gait Training: 23-37 mins PT General Charges $$ ACUTE PT VISIT: 1 Visit                    Henretta Lodge PT, DPT Physical Therapist Acute Rehabilitation Services Office: [phone]  Myna Asal Payson 12/18/2023, 2:18 PM

## 2023-12-18 NOTE — Progress Notes (Signed)
  Inpatient Rehabilitation Admissions Coordinator   I spoke with patient by phone for rehab assessment.  Please note Dr Delfin Fell note . Patient previously at [facility] after SCI. Was with us  27 days and went home min assist at wheelchair level . We discussed goals and expectations of a possible CIR admit. He prefers CIR for rehab. He has his granddaughter, Boyd Cabal who is 75 years old and 2 sister in laws that can assist. He prefers CIR rather than SNF. I await OT eval today to begin Auth with [facility]. Please call me with any questions.   Jeannetta Millman, RN, MSN Rehab Admissions Coordinator [phone]

## 2023-12-19 DIAGNOSIS — M4624 Osteomyelitis of vertebra, thoracic region: Secondary | ICD-10-CM | POA: Diagnosis not present

## 2023-12-19 LAB — GLUCOSE, CAPILLARY
Glucose-Capillary: 134 mg/dL — ABNORMAL HIGH (ref 70–99)
Glucose-Capillary: 171 mg/dL — ABNORMAL HIGH (ref 70–99)
Glucose-Capillary: 133 mg/dL — ABNORMAL HIGH (ref 70–99)
Glucose-Capillary: 155 mg/dL — ABNORMAL HIGH (ref 70–99)

## 2023-12-19 MED ORDER — METHOCARBAMOL 500 MG PO TABS
750.0000 mg | ORAL_TABLET | Freq: Three times a day (TID) | ORAL | Status: AC
  Administered 2023-12-19 – 2023-12-20 (×6): 750 mg via ORAL
  Filled 2023-12-19 (×8): qty 2

## 2023-12-19 NOTE — Progress Notes (Signed)
 PROGRESS NOTE  Franklin Hicks Every  DOB: 1950-12-04  PCP: Almira Jaeger, MD ZOX:[id number]  DOA: 12/14/2023  LOS: 4 days  Hospital Day: 6  Brief narrative: Franklin Hicks is a 73 y.o. male with PMH significant for morbid obesity, DM2, HTN, HLD, CAD/stent, h/o recurrent PE on Xarelto , generalized arthritis.  In 2019, patient had cervical spine fracture after a fall and underwent surgical fixation. At baseline, patient uses a walker to ambulate. For the last few months, patient has developed progressively worsening back pain. In December, he was seen by his PCP and referred to orthopedics.  It seems he had CT renal study obtained at that time which suggested nonobstructing 4 mm calculus in the lower pole of the left kidney and diffuse degenerative disc disease of the lumbar spine. He was started on muscle relaxant and sent to PT. Patient continued to have pain but was manageable but lately it is progressive and he has had significant limitation in mobility. With worsening symptoms, patient presented to the ED 4/21.  Initial workup with CT thoracic spine raised concern of discitis osteomyelitis at T11-T12.  MRI thoracic and lumbar spine were subsequently obtained.  They noted that the findings have been present since at least 07/2023.  Started on empiric IV antibiotics. Admitted to [facility]  Case was discussed with neurosurgery Dr. Nat Badger. Per neurosurgery, unlikely to be an infection.  Imagings also showed T8-T11-L4 S1 ankylosis as well as at least moderate spinal stenosis at T11-T12 with bilateral foraminal impingement involving T7-T8 to T11-12.  These findings are most likely the cause of progressive pain. PT recommended CIR.  Pending insurance approval   Subjective: Patient was seen and examined this morning. Lying down in bed.  Pain and spasm under control. Feels relieved after bowel movement yesterday. Pending CIR  Assessment and plan: Intractable back pain  Presented with  progressively worsening back pain for last 5 months  CT scan and MRI thoracic and lumbar spine as above. There was initial concern of discitis/osteomyelitis and hence IV antibiotics were started but after neurosurgery review, infection ruled out.  IV antibiotics were stopped. Imagings also showed T8-T11-L4 S1 ankylosis as well as at least moderate spinal stenosis at T11-T12 with bilateral foraminal impingement involving T7-T8 to T11-12.  These findings are most likely the cause of progressive pain. PT recommended CIR.  Pending insurance approval Pain regimen:  --Scheduled Tylenol  1 g 3 times daily, Robaxin  750 mg 3 times daily, Neurontin  100 mg 3 times daily, --PRN: oxycodone , IV morphine ,   Abdominal pain Constipation Patient also reports bilateral flank pain on trying to sit up. 07/2023 CT renal study had shown a nonobstructive 4 mm calculus in the lower pole of the left kidney. Subsequent imaging since then has not shown any intra-abdominal pathology. Abdominal pain mild and persistent.  Somewhat improved with relief in constipation yesterday. Continue bowel regimen with scheduled Senokot and PRN MiraLAX .  Type 2 diabetes mellitus A1c 7 on 12/01/2023 PTA meds-none Start SSI/Accu-Cheks Recent Labs  Lab 12/18/23 0714 12/18/23 1119 12/18/23 1628 12/18/23 2145 12/19/23 0737  GLUCAP 120* 120* 102* 145* 134*   H/o recurrent PE Chronically on Xarelto    CAD/stent Aortic atherosclerosis HLD Continue aspirin , Xarelto , metoprolol , Lipitor   Chronic diastolic CHF Frequent PVCs/NSVT HTN Currently euvolemic. Continue metoprolol , terazosin .  Not on diuretics chronically Electrolytes normal.  echocardiogram 4/25 with EF 60 to 65%, grade 1 diastolic dysfunction, Recent Labs  Lab 12/14/23 1631 12/15/23 0300 12/17/23 0400 12/18/23 0321  K 4.1 3.5 4.1 4.0  MG  --  2.0  --   --   PHOS  --  3.4  --   --     Morbid Obesity  Body mass index is 51.01 kg/m. Patient has been advised to  make an attempt to improve diet and exercise patterns to aid in weight loss.  Thyroid  enlargement  CT scan also noted heterogeneous and mildly enlarged thyroid  gland.   Recommend correlation with nonemergent thyroid  ultrasound, per radiology report.    Mobility: PT eval obtained.  CIR recommended  Goals of care   Code Status: Full Code     DVT prophylaxis:  rivaroxaban  (XARELTO ) tablet 20 mg Start: 12/15/23 1000 rivaroxaban  (XARELTO ) tablet 20 mg   Antimicrobials: None Fluid: Not on IV fluid Consultants: Discussed with neurosurgery on the phone Family Communication: Daughter at bedside  Status: Inpatient Level of care:  Telemetry   Patient is from: Home Needs to continue in-hospital care: Medically stable.  Pending CIR. Anticipated d/c to: CIR eventually    Diet:  Diet Order             Diet regular Room service appropriate? Yes; Fluid consistency: Thin  Diet effective now                   Scheduled Meds:  acetaminophen   1,000 mg Oral TID   aspirin  EC  81 mg Oral Daily   atorvastatin   80 mg Oral Daily   gabapentin   100 mg Oral TID   insulin  aspart  0-5 Units Subcutaneous QHS   insulin  aspart  0-9 Units Subcutaneous TID WC   metoprolol  tartrate  50 mg Oral BID    morphine  injection  4 mg Intravenous Once   rivaroxaban   20 mg Oral Daily   senna-docusate  1 tablet Oral QHS   tacrolimus    Topical BID   terazosin   10 mg Oral QHS    PRN meds: melatonin, morphine  injection, mouth rinse, oxyCODONE , polyethylene glycol, prochlorperazine    Infusions:     Antimicrobials: Anti-infectives (From admission, onward)    Start     Dose/Rate Route Frequency Ordered Stop   12/15/23 2300  cefTRIAXone  (ROCEPHIN ) 2 g in sodium chloride  0.9 % 100 mL IVPB  Status:  Discontinued        2 g 200 mL/hr over 30 Minutes Intravenous Every 24 hours 12/15/23 0111 12/16/23 1155   12/15/23 1200  vancomycin  (VANCOREADY) IVPB 1250 mg/250 mL  Status:  Discontinued        1,250  mg 166.7 mL/hr over 90 Minutes Intravenous Every 12 hours 12/15/23 0116 12/16/23 1155   12/15/23 0115  vancomycin  (VANCOREADY) IVPB 750 mg/150 mL  Status:  Discontinued        750 mg 150 mL/hr over 60 Minutes Intravenous Every 24 hours 12/15/23 0111 12/15/23 0116   12/14/23 2330  vancomycin  (VANCOREADY) IVPB 2000 mg/400 mL        2,000 mg 200 mL/hr over 120 Minutes Intravenous  Once 12/14/23 2316 12/15/23 0233   12/14/23 2315  cefTRIAXone  (ROCEPHIN ) 2 g in sodium chloride  0.9 % 100 mL IVPB        2 g 200 mL/hr over 30 Minutes Intravenous  Once 12/14/23 2308 12/15/23 0013       Objective: Vitals:   12/18/23 2142 12/19/23 0616  BP: (!) 144/68 (!) 150/68  Pulse: 60 75  Resp: 18 18  Temp: 98.4 F (36.9 C) 98.2 F (36.8 C)  SpO2: 98% 99%    Intake/Output Summary (Last 24 hours)  at 12/19/2023 0940 Last data filed at 12/19/2023 0727 Gross per 24 hour  Intake --  Output 1050 ml  Net -1050 ml   Filed Weights   12/16/23 0710 12/17/23 0451 12/18/23 0500  Weight: (!) 166.8 kg (!) 171.4 kg (!) 170.6 kg   Weight change:  Body mass index is 51.01 kg/m.   Physical Exam: General exam: Pleasant, elderly African-American male.  Morbidly obese.  Pain better controlled. Skin: No rashes, lesions or ulcers. HEENT: Atraumatic, normocephalic, no obvious bleeding Lungs: Clear to auscultation bilaterally,  CVS: S1, S2, no murmur,   GI/Abd: Soft, mild tenderness on both flank, distant from obesity, bowel sound present,   CNS: Alert, awake, oriented x 3 Psychiatry: Frustrated due to long wait for insurance approval Extremities: No pedal edema, no calf tenderness,   Data Review: I have personally reviewed the laboratory data and studies available.  F/u labs ordered Unresulted Labs (From admission, onward)    None      Total time spent in review of labs and imaging, patient evaluation, formulation of plan, documentation and communication with family: 45 minutes  Signed, Hoyt Macleod,  MD [facility] 12/19/2023

## 2023-12-19 NOTE — Plan of Care (Signed)
   Problem: Education: Goal: Ability to describe self-care measures that may prevent or decrease complications (Diabetes Survival Skills Education) will improve Outcome: Progressing   Problem: Coping: Goal: Ability to adjust to condition or change in health will improve Outcome: Progressing   Problem: Fluid Volume: Goal: Ability to maintain a balanced intake and output will improve Outcome: Progressing

## 2023-12-20 DIAGNOSIS — M4624 Osteomyelitis of vertebra, thoracic region: Secondary | ICD-10-CM | POA: Diagnosis not present

## 2023-12-20 LAB — CULTURE, BLOOD (ROUTINE X 2)
Culture: NO GROWTH
Culture: NO GROWTH
Special Requests: ADEQUATE

## 2023-12-20 LAB — GLUCOSE, CAPILLARY
Glucose-Capillary: 127 mg/dL — ABNORMAL HIGH (ref 70–99)
Glucose-Capillary: 127 mg/dL — ABNORMAL HIGH (ref 70–99)
Glucose-Capillary: 133 mg/dL — ABNORMAL HIGH (ref 70–99)
Glucose-Capillary: 136 mg/dL — ABNORMAL HIGH (ref 70–99)

## 2023-12-20 MED ORDER — DOCUSATE SODIUM 100 MG PO CAPS
100.0000 mg | ORAL_CAPSULE | Freq: Two times a day (BID) | ORAL | Status: DC
  Administered 2023-12-20 – 2023-12-21 (×3): 100 mg via ORAL
  Filled 2023-12-20 (×3): qty 1

## 2023-12-20 NOTE — Plan of Care (Signed)
   Problem: Elimination: Goal: Will not experience complications related to urinary retention Outcome: Progressing   Problem: Pain Managment: Goal: General experience of comfort will improve and/or be controlled Outcome: Progressing   Problem: Safety: Goal: Ability to remain free from injury will improve Outcome: Progressing

## 2023-12-20 NOTE — Plan of Care (Signed)

## 2023-12-20 NOTE — Progress Notes (Addendum)
 PROGRESS NOTE  Franklin Hicks  DOB: 1951/01/06  PCP: Almira Jaeger, MD ZOX:[id number]  DOA: 12/14/2023  LOS: 5 days  Hospital Day: 7  Brief narrative: Franklin Hicks is a 73 y.o. male with PMH significant for morbid obesity, DM2, HTN, HLD, CAD/stent, h/o recurrent PE on Xarelto , generalized arthritis.  In 2019, patient had cervical spine fracture after a fall and underwent surgical fixation. At baseline, patient uses a walker to ambulate. For the last few months, patient has developed progressively worsening back pain. In December, he was seen by his PCP and referred to orthopedics.  It seems he had CT renal study obtained at that time which suggested nonobstructing 4 mm calculus in the lower pole of the left kidney and diffuse degenerative disc disease of the lumbar spine. He was started on muscle relaxant and sent to PT. Patient continued to have pain but was manageable but lately it is progressive and he has had significant limitation in mobility. With worsening symptoms, patient presented to the ED 4/21.  Initial workup with CT thoracic spine raised concern of discitis osteomyelitis at T11-T12.  MRI thoracic and lumbar spine were subsequently obtained.  They noted that the findings have been present since at least 07/2023.  Started on empiric IV antibiotics. Admitted to [facility]  Case was discussed with neurosurgery Dr. Nat Badger. Per neurosurgery, unlikely to be an infection.  Imagings also showed T8-T11-L4 S1 ankylosis as well as at least moderate spinal stenosis at T11-T12 with bilateral foraminal impingement involving T7-T8 to T11-12.  These findings are most likely the cause of progressive pain. PT recommended CIR.  Pending insurance approval  4/27: Remained hemodynamically stable-awaiting insurance approval for CIR. Osteomyelitis has been ruled out.   Subjective: Patient was seen and examined today.  Stating that pain is at baseline no exacerbation.  Wants to get out of hospital  as soon as possible.  Assessment and plan: Intractable back pain  Presented with progressively worsening back pain for last 5 months  CT scan and MRI thoracic and lumbar spine as above. There was initial concern of discitis/osteomyelitis and hence IV antibiotics were started but after neurosurgery review, infection ruled out.  IV antibiotics were stopped. Imagings also showed T8-T11-L4 S1 ankylosis as well as at least moderate spinal stenosis at T11-T12 with bilateral foraminal impingement involving T7-T8 to T11-12.  These findings are most likely the cause of progressive pain. PT recommended CIR.  Pending insurance approval Pain regimen:  --Scheduled Tylenol  1 g 3 times daily, Robaxin  750 mg 3 times daily, Neurontin  100 mg 3 times daily, --PRN: oxycodone , IV morphine ,   Abdominal pain Constipation Patient also reports bilateral flank pain on trying to sit up. 07/2023 CT renal study had shown a nonobstructive 4 mm calculus in the lower pole of the left kidney. Subsequent imaging since then has not shown any intra-abdominal pathology. Abdominal pain mild and persistent.  Somewhat improved with relief in constipation yesterday. Continue bowel regimen with scheduled Senokot and PRN MiraLAX .  Type 2 diabetes mellitus A1c 7 on 12/01/2023 PTA meds-none Start SSI/Accu-Cheks  H/o recurrent PE Chronically on Xarelto    CAD/stent Aortic atherosclerosis HLD Continue aspirin , Xarelto , metoprolol , Lipitor   Chronic diastolic CHF Frequent PVCs/NSVT HTN Currently euvolemic. Continue metoprolol , terazosin .  Not on diuretics chronically Electrolytes normal.  echocardiogram 4/25 with EF 60 to 65%, grade 1 diastolic dysfunction,  Morbid Obesity  Body mass index is 49.75 kg/m. Patient has been advised to make an attempt to improve diet and exercise patterns to aid  in weight loss.  Thyroid  enlargement  CT scan also noted heterogeneous and mildly enlarged thyroid  gland.   Recommend correlation  with nonemergent thyroid  ultrasound, per radiology report.    Mobility: PT eval obtained.  CIR recommended  Goals of care   Code Status: Full Code     DVT prophylaxis:  rivaroxaban  (XARELTO ) tablet 20 mg Start: 12/15/23 1000 rivaroxaban  (XARELTO ) tablet 20 mg   Antimicrobials: None Fluid: Not on IV fluid Consultants: Discussed with neurosurgery on the phone Family Communication: Discussed with granddaughter at bedside  Status: Inpatient Level of care:  Med-Surg   Patient is from: Home Needs to continue in-hospital care: Medically stable.  Pending CIR. Anticipated d/c to: CIR eventually    Diet:  Diet Order             Diet regular Room service appropriate? Yes; Fluid consistency: Thin  Diet effective now                   Scheduled Meds:  acetaminophen   1,000 mg Oral TID   aspirin  EC  81 mg Oral Daily   atorvastatin   80 mg Oral Daily   docusate sodium   100 mg Oral BID   gabapentin   100 mg Oral TID   insulin  aspart  0-5 Units Subcutaneous QHS   insulin  aspart  0-9 Units Subcutaneous TID WC   methocarbamol   750 mg Oral TID   metoprolol  tartrate  50 mg Oral BID    morphine  injection  4 mg Intravenous Once   rivaroxaban   20 mg Oral Daily   senna-docusate  1 tablet Oral QHS   tacrolimus    Topical BID   terazosin   10 mg Oral QHS    PRN meds: melatonin, morphine  injection, mouth rinse, oxyCODONE , polyethylene glycol, prochlorperazine    Infusions:     Antimicrobials: Anti-infectives (From admission, onward)    Start     Dose/Rate Route Frequency Ordered Stop   12/15/23 2300  cefTRIAXone  (ROCEPHIN ) 2 g in sodium chloride  0.9 % 100 mL IVPB  Status:  Discontinued        2 g 200 mL/hr over 30 Minutes Intravenous Every 24 hours 12/15/23 0111 12/16/23 1155   12/15/23 1200  vancomycin  (VANCOREADY) IVPB 1250 mg/250 mL  Status:  Discontinued        1,250 mg 166.7 mL/hr over 90 Minutes Intravenous Every 12 hours 12/15/23 0116 12/16/23 1155   12/15/23 0115   vancomycin  (VANCOREADY) IVPB 750 mg/150 mL  Status:  Discontinued        750 mg 150 mL/hr over 60 Minutes Intravenous Every 24 hours 12/15/23 0111 12/15/23 0116   12/14/23 2330  vancomycin  (VANCOREADY) IVPB 2000 mg/400 mL        2,000 mg 200 mL/hr over 120 Minutes Intravenous  Once 12/14/23 2316 12/15/23 0233   12/14/23 2315  cefTRIAXone  (ROCEPHIN ) 2 g in sodium chloride  0.9 % 100 mL IVPB        2 g 200 mL/hr over 30 Minutes Intravenous  Once 12/14/23 2308 12/15/23 0013       Objective: Vitals:   12/20/23 0607 12/20/23 1318  BP: (!) 145/81 (!) 158/74  Pulse: 78 69  Resp: 16 17  Temp: 98 F (36.7 C) 98.4 F (36.9 C)  SpO2: 97% 97%    Intake/Output Summary (Last 24 hours) at 12/20/2023 1356 Last data filed at 12/20/2023 1012 Gross per 24 hour  Intake 900 ml  Output 1150 ml  Net -250 ml   [employer]   12/17/23  0451 12/18/23 0500 12/20/23 0720  Weight: (!) 171.4 kg (!) 170.6 kg (!) 166.4 kg   Weight change:  Body mass index is 49.75 kg/m.   Physical Exam: General.  Morbidly obese elderly man, in no acute distress. Pulmonary.  Lungs clear bilaterally, normal respiratory effort. CV.  Regular rate and rhythm, no JVD, rub or murmur. Abdomen.  Soft, nontender, nondistended, BS positive. CNS.  Alert and oriented .  No focal neurologic deficit. Extremities.  No edema, no cyanosis, pulses intact and symmetrical. Psychiatry.  Judgment and insight appears normal.   Data Review: I have personally reviewed the laboratory data and studies available.  F/u labs ordered Unresulted Labs (From admission, onward)    None      Total time spent in review of labs and imaging, patient evaluation, formulation of plan, documentation and communication with family: 44 minutes  Signed, Luna Salinas, MD [facility] 12/20/2023

## 2023-12-20 NOTE — Progress Notes (Signed)
 Physical Therapy Treatment Patient Details Name: Franklin Hicks MRN: [id number] DOB: 28-Aug-1950 Today's Date: 12/20/2023   History of Present Illness Pt is 73 yo male admitted on 12/14/23 with intractable back pain initially suspected to have osteomyelitis of T11-T12. Pt has had pain for several months but progressively has worsened.  Neurosurgery consulted and no surgery indicated. Pt with hx including but not limited to morbid obesity, DM2, HTN, HLD, CAD, PE, arthritis.    PT Comments  Pt sitting EOB on arrival and reports sitting EOB for a couple hours.  Pt encouraged to ambulate today so pt performed a couple sit to stands in preparation and then ambulated 30 ft in hallway!  Pt fearful of spasms/pain however no spasms occurred and pain tolerable.  Recliner followed closely for safety and to help pt feel more secure however not required.  Current d/c plan for CIR pending insurance.     If plan is discharge home, recommend the following: A little help with walking and/or transfers;A little help with bathing/dressing/bathroom   Can travel by private vehicle        Equipment Recommendations  Hospital bed    Recommendations for Other Services       Precautions / Restrictions Precautions Precautions: Back;Fall     Mobility  Bed Mobility               General bed mobility comments: pt sitting EOB on arrival    Transfers Overall transfer level: Needs assistance Equipment used: Rolling walker (2 wheels) Transfers: Sit to/from Stand Sit to Stand: Contact guard assist           General transfer comment: performed sit to stand twice, reliant on UE assist, CGA for safety especially with transition of hands to RW    Ambulation/Gait Ambulation/Gait assistance: Contact guard assist Gait Distance (Feet): 30 Feet Assistive device: Rolling walker (2 wheels) Gait Pattern/deviations: Step-through pattern, Decreased stride length, Wide base of support Gait velocity: decr      General Gait Details: forward flexed posture over RW for comfort, recliner following closely for safety however not required (also pt fearful of spasms but did not have any during ambulation)   Stairs             Wheelchair Mobility     Tilt Bed    Modified Rankin (Stroke Patients Only)       Balance                                            Communication Communication Communication: No apparent difficulties  Cognition Arousal: Alert Behavior During Therapy: WFL for tasks assessed/performed   PT - Cognitive impairments: No apparent impairments                         Following commands: Intact      Cueing    Exercises      General Comments        Pertinent Vitals/Pain Pain Assessment Pain Assessment: 0-10 Pain Score: 5  Pain Location: L flank "knot" Pain Descriptors / Indicators: Tightness Pain Intervention(s): Monitored during session, Repositioned    Home Living                          Prior Function            PT  Goals (current goals can now be found in the care plan section) Progress towards PT goals: Progressing toward goals    Frequency    Min 3X/week      PT Plan      Co-evaluation              AM-PAC PT "6 Clicks" Mobility   Outcome Measure  Help needed turning from your back to your side while in a flat bed without using bedrails?: A Little Help needed moving from lying on your back to sitting on the side of a flat bed without using bedrails?: A Little Help needed moving to and from a bed to a chair (including a wheelchair)?: A Little Help needed standing up from a chair using your arms (e.g., wheelchair or bedside chair)?: A Little Help needed to walk in hospital room?: A Little Help needed climbing 3-5 steps with a railing? : A Lot 6 Click Score: 17    End of Session Equipment Utilized During Treatment: Gait belt Activity Tolerance: Patient tolerated treatment  well Patient left: in bed;with call bell/phone within reach (sitting EOB with tray table for lunch)   PT Visit Diagnosis: Other abnormalities of gait and mobility (R26.89);Muscle weakness (generalized) (M62.81)     Time: 1416-1430 PT Time Calculation (min) (ACUTE ONLY): 14 min  Charges:    $Gait Training: 8-22 mins PT General Charges $$ ACUTE PT VISIT: 1 Visit                    Blanch Bunde, DPT Physical Therapist Acute Rehabilitation Services Office: [phone]    Myna Asal Payson 12/20/2023, 4:50 PM

## 2023-12-21 DIAGNOSIS — M545 Low back pain, unspecified: Secondary | ICD-10-CM

## 2023-12-21 DIAGNOSIS — M4624 Osteomyelitis of vertebra, thoracic region: Secondary | ICD-10-CM | POA: Diagnosis not present

## 2023-12-21 LAB — GLUCOSE, CAPILLARY
Glucose-Capillary: 123 mg/dL — ABNORMAL HIGH (ref 70–99)
Glucose-Capillary: 153 mg/dL — ABNORMAL HIGH (ref 70–99)
Glucose-Capillary: 129 mg/dL — ABNORMAL HIGH (ref 70–99)

## 2023-12-21 MED ORDER — OXYCODONE HCL 5 MG PO TABS: 5.0000 mg | ORAL_TABLET | Freq: Four times a day (QID) | ORAL | 0 refills | Status: DC | PRN

## 2023-12-21 MED ORDER — DOCUSATE SODIUM 100 MG PO CAPS: 100.0000 mg | ORAL_CAPSULE | Freq: Two times a day (BID) | ORAL | 0 refills | Status: DC

## 2023-12-21 MED ORDER — POLYETHYLENE GLYCOL 3350 17 G PO PACK: 17.0000 g | PACK | Freq: Every day | ORAL | 0 refills | Status: DC | PRN

## 2023-12-21 MED ORDER — METHOCARBAMOL 500 MG PO TABS: 500.0000 mg | ORAL_TABLET | Freq: Three times a day (TID) | ORAL | 0 refills | Status: DC | PRN

## 2023-12-21 NOTE — Progress Notes (Signed)
 PROGRESS NOTE  Franklin Hicks  DOB: [birth year]  PCP: Almira Jaeger, MD WUJ:[id number]  DOA: 12/14/2023  LOS: 6 days  Hospital Day: 8  Brief narrative: Franklin Hicks is a 73 y.o. male with PMH significant for morbid obesity, DM2, HTN, HLD, CAD/stent, h/o recurrent PE on Xarelto , generalized arthritis.  In 2019, patient had cervical spine fracture after a fall and underwent surgical fixation. At baseline, patient uses a walker to ambulate. For the last few months, patient has developed progressively worsening back pain. In December, he was seen by his PCP and referred to orthopedics.  It seems he had CT renal study obtained at that time which suggested nonobstructing 4 mm calculus in the lower pole of the left kidney and diffuse degenerative disc disease of the lumbar spine. He was started on muscle relaxant and sent to PT. Patient continued to have pain but was manageable but lately it is progressive and he has had significant limitation in mobility. With worsening symptoms, patient presented to the ED 4/21.  Initial workup with CT thoracic spine raised concern of discitis osteomyelitis at T11-T12.  MRI thoracic and lumbar spine were subsequently obtained.  They noted that the findings have been present since at least 07/2023.  Started on empiric IV antibiotics. Admitted to [facility]  Case was discussed with neurosurgery Dr. Nat Badger. Per neurosurgery, unlikely to be an infection.  Imagings also showed T8-T11-L4 S1 ankylosis as well as at least moderate spinal stenosis at T11-T12 with bilateral foraminal impingement involving T7-T8 to T11-12.  These findings are most likely the cause of progressive pain. PT recommended CIR.  Pending insurance approval  4/27: Remained hemodynamically stable-awaiting insurance approval for CIR. Osteomyelitis has been ruled out.  4/28: Remained stable, awaiting insurance authorization for CIR   Subjective: Patient was sitting at the side of bed when seen  today.  No new concern.  Assessment and plan: Intractable back pain  Presented with progressively worsening back pain for last 5 months  CT scan and MRI thoracic and lumbar spine as above. There was initial concern of discitis/osteomyelitis and hence IV antibiotics were started but after neurosurgery review, infection ruled out.  IV antibiotics were stopped. Imagings also showed T8-T11-L4 S1 ankylosis as well as at least moderate spinal stenosis at T11-T12 with bilateral foraminal impingement involving T7-T8 to T11-12.  These findings are most likely the cause of progressive pain. PT recommended CIR.  Pending insurance approval Pain regimen:  --Scheduled Tylenol  1 g 3 times daily, Robaxin  750 mg 3 times daily, Neurontin  100 mg 3 times daily, --PRN: oxycodone , IV morphine ,   Abdominal pain Constipation Patient also reports bilateral flank pain on trying to sit up. 07/2023 CT renal study had shown a nonobstructive 4 mm calculus in the lower pole of the left kidney. Subsequent imaging since then has not shown any intra-abdominal pathology. Abdominal pain mild and persistent.  Somewhat improved with relief in constipation yesterday. Continue bowel regimen with scheduled Senokot and PRN MiraLAX .  Type 2 diabetes mellitus A1c 7 on 12/01/2023 PTA meds-none Start SSI/Accu-Cheks  H/o recurrent PE Chronically on Xarelto    CAD/stent Aortic atherosclerosis HLD Continue aspirin , Xarelto , metoprolol , Lipitor   Chronic diastolic CHF Frequent PVCs/NSVT HTN Currently euvolemic. Continue metoprolol , terazosin .  Not on diuretics chronically Electrolytes normal.  echocardiogram 4/25 with EF 60 to 65%, grade 1 diastolic dysfunction,  Morbid Obesity  Body mass index is 50.68 kg/m. Patient has been advised to make an attempt to improve diet and exercise patterns to aid in weight  loss.  Thyroid  enlargement  CT scan also noted heterogeneous and mildly enlarged thyroid  gland.   Recommend  correlation with nonemergent thyroid  ultrasound, per radiology report.    Mobility: PT eval obtained.  CIR recommended  Goals of care   Code Status: Full Code     DVT prophylaxis:  rivaroxaban  (XARELTO ) tablet 20 mg Start: 12/15/23 1000 rivaroxaban  (XARELTO ) tablet 20 mg   Antimicrobials: None Fluid: Not on IV fluid Consultants: Discussed with neurosurgery on the phone Family Communication: Discussed with granddaughter at bedside  Status: Inpatient Level of care:  Med-Surg   Patient is from: Home Needs to continue in-hospital care: Medically stable.  Pending CIR. Anticipated d/c to: CIR eventually    Diet:  Diet Order             Diet regular Room service appropriate? Yes; Fluid consistency: Thin  Diet effective now                   Scheduled Meds:  acetaminophen   1,000 mg Oral TID   aspirin  EC  81 mg Oral Daily   atorvastatin   80 mg Oral Daily   docusate sodium   100 mg Oral BID   gabapentin   100 mg Oral TID   insulin  aspart  0-5 Units Subcutaneous QHS   insulin  aspart  0-9 Units Subcutaneous TID WC   metoprolol  tartrate  50 mg Oral BID    morphine  injection  4 mg Intravenous Once   rivaroxaban   20 mg Oral Daily   senna-docusate  1 tablet Oral QHS   tacrolimus    Topical BID   terazosin   10 mg Oral QHS    PRN meds: melatonin, morphine  injection, mouth rinse, oxyCODONE , polyethylene glycol, prochlorperazine    Infusions:     Antimicrobials: Anti-infectives (From admission, onward)    Start     Dose/Rate Route Frequency Ordered Stop   12/15/23 2300  cefTRIAXone  (ROCEPHIN ) 2 g in sodium chloride  0.9 % 100 mL IVPB  Status:  Discontinued        2 g 200 mL/hr over 30 Minutes Intravenous Every 24 hours 12/15/23 0111 12/16/23 1155   12/15/23 1200  vancomycin  (VANCOREADY) IVPB 1250 mg/250 mL  Status:  Discontinued        1,250 mg 166.7 mL/hr over 90 Minutes Intravenous Every 12 hours 12/15/23 0116 12/16/23 1155   12/15/23 0115  vancomycin  (VANCOREADY)  IVPB 750 mg/150 mL  Status:  Discontinued        750 mg 150 mL/hr over 60 Minutes Intravenous Every 24 hours 12/15/23 0111 12/15/23 0116   12/14/23 2330  vancomycin  (VANCOREADY) IVPB 2000 mg/400 mL        2,000 mg 200 mL/hr over 120 Minutes Intravenous  Once 12/14/23 2316 12/15/23 0233   12/14/23 2315  cefTRIAXone  (ROCEPHIN ) 2 g in sodium chloride  0.9 % 100 mL IVPB        2 g 200 mL/hr over 30 Minutes Intravenous  Once 12/14/23 2308 12/15/23 0013       Objective: Vitals:   12/21/23 1007 12/21/23 1307  BP: (!) 154/64 (!) 128/105  Pulse: 81 72  Resp:  16  Temp:  (!) 97.5 F (36.4 C)  SpO2:  97%    Intake/Output Summary (Last 24 hours) at 12/21/2023 1411 Last data filed at 12/21/2023 0740 Gross per 24 hour  Intake 120 ml  Output 1300 ml  Net -1180 ml   Filed Weights   12/18/23 0500 12/20/23 0720 12/21/23 0714  Weight: (!) 170.6 kg (!) 166.4  kg (!) 169.5 kg   Weight change:  Body mass index is 50.68 kg/m.   Physical Exam: General.  Morbidly obese gentleman, in no acute distress. Pulmonary.  Lungs clear bilaterally, normal respiratory effort. CV.  Regular rate and rhythm, no JVD, rub or murmur. Abdomen.  Soft, nontender, nondistended, BS positive. CNS.  Alert and oriented .  No focal neurologic deficit. Extremities.  No edema, no cyanosis, pulses intact and symmetrical. Psychiatry.  Judgment and insight appears normal.   Data Review: I have personally reviewed the laboratory data and studies available.  F/u labs ordered Unresulted Labs (From admission, onward)    None      Total time spent in review of labs and imaging, patient evaluation, formulation of plan, documentation and communication with family: 42 minutes  Signed, Luna Salinas, MD [facility] 12/21/2023

## 2023-12-21 NOTE — Plan of Care (Signed)
 Patient discharged into front waiting area of hospital per his request. He is educated that he may remain in room until his ride arrives but declines. AVS and discharge instruction provided. Patient verbalizes understanding. Ara Knee, RN 12/21/23 6:00 PM

## 2023-12-21 NOTE — Progress Notes (Signed)
 Inpatient Rehab Admissions Coordinator:   Continue to wait for insurance decision regarding CIR. Will continue to follow.   Rehab Admissons Coordinator Anaissa Macfadden, [place], [place] [phone]

## 2023-12-21 NOTE — Progress Notes (Signed)
 Physical Therapy Treatment Patient Details Name: Franklin Hicks MRN: [id number] DOB: 10-17-1950 Today's Date: 12/21/2023   History of Present Illness Pt is 73 yo male admitted on 12/14/23 with intractable back pain initially suspected to have osteomyelitis of T11-T12. Pt has had pain for several months but progressively has worsened.  Neurosurgery consulted and no surgery indicated. Pt with hx including but not limited to morbid obesity, DM2, HTN, HLD, CAD, PE, arthritis.    PT Comments  Pt making good progress with ambulation and pain control.  Still requires use of bed features (rails and HOB elevated) for bed mobility and bed elevated for standing.  Will cont POC.  Patient will benefit from intensive inpatient follow-up therapy, >3 hours/day at d/c.     If plan is discharge home, recommend the following: A little help with walking and/or transfers;A little help with bathing/dressing/bathroom   Can travel by private vehicle     No  Equipment Recommendations  Hospital bed    Recommendations for Other Services       Precautions / Restrictions Precautions Precautions: Back;Fall Restrictions Weight Bearing Restrictions Per Provider Order: No     Mobility  Bed Mobility Overal bed mobility: Needs Assistance Bed Mobility: Supine to Sit, Sit to Supine     Supine to sit: Contact guard, HOB elevated, Used rails Sit to supine: HOB elevated, Used rails, Min assist   General bed mobility comments: Increased time and HOB significantly elevated.  Light min A for legs back to bed    Transfers Overall transfer level: Needs assistance Equipment used: Rolling walker (2 wheels) Transfers: Sit to/from Stand Sit to Stand: Contact guard assist           General transfer comment: STS x 7 during session. Attempted 1 rep at only slightly elevated height but increased effort and pain so elevated for remainder of reps. Pt has hx of L LE knee weakness/stiffness so keeps it extended some during  transfer. Increased time and effort but only CGA from elevated height.  Unable to tolerate normal/low height STS    Ambulation/Gait Ambulation/Gait assistance: Contact guard assist Gait Distance (Feet): 55 Feet Assistive device: Rolling walker (2 wheels) Gait Pattern/deviations: Step-through pattern, Decreased stride length, Wide base of support, Antalgic Gait velocity: decr     General Gait Details: Pt with forward flexed posture over RW for comfort, recliner following for safety however not required   Stairs             Wheelchair Mobility     Tilt Bed    Modified Rankin (Stroke Patients Only)       Balance Overall balance assessment: Needs assistance Sitting-balance support: No upper extremity supported Sitting balance-[name]: Fair     Standing balance support: Bilateral upper extremity supported Standing balance-[name]: Poor Standing balance comment: leaning forward, CGA; use of RW                            Communication Communication Communication: No apparent difficulties  Cognition Arousal: Alert Behavior During Therapy: WFL for tasks assessed/performed   PT - Cognitive impairments: No apparent impairments                                Cueing    Exercises      General Comments General comments (skin integrity, edema, etc.): B compression hose in place, slip on shoes appled EOB  for safety      Pertinent Vitals/Pain Pain Assessment Pain Assessment: 0-10 Pain Score: 3  Pain Location: Back Pain Descriptors / Indicators: Tightness Pain Intervention(s): Limited activity within patient's tolerance, Monitored during session, Premedicated before session, Repositioned    Home Living                          Prior Function            PT Goals (current goals can now be found in the care plan section) Progress towards PT goals: Progressing toward goals    Frequency    Min 3X/week      PT  Plan      Co-evaluation              AM-PAC PT "6 Clicks" Mobility   Outcome Measure  Help needed turning from your back to your side while in a flat bed without using bedrails?: A Little Help needed moving from lying on your back to sitting on the side of a flat bed without using bedrails?: A Lot (needs HOB elevated) Help needed moving to and from a bed to a chair (including a wheelchair)?: A Little Help needed standing up from a chair using your arms (e.g., wheelchair or bedside chair)?: A Little Help needed to walk in hospital room?: A Little Help needed climbing 3-5 steps with a railing? : A Lot 6 Click Score: 16    End of Session Equipment Utilized During Treatment: Gait belt Activity Tolerance: Patient tolerated treatment well Patient left: in bed;with call bell/phone within reach Nurse Communication: Mobility status PT Visit Diagnosis: Other abnormalities of gait and mobility (R26.89);Muscle weakness (generalized) (M62.81)     Time: 1610-9604 PT Time Calculation (min) (ACUTE ONLY): 25 min  Charges:    $Gait Training: 8-22 mins $Therapeutic Activity: 8-22 mins PT General Charges $$ ACUTE PT VISIT: 1 Visit                     Cyd Dowse, PT Acute Rehab [facility] [phone]    Carolynn Citrin 12/21/2023, 11:47 AM

## 2023-12-21 NOTE — Discharge Summary (Signed)
 Physician Discharge Summary   Patient: Franklin Hicks MRN: [id number] DOB: 14-Apr-1951  Admit date:     12/14/2023  Discharge date: 12/21/23  Discharge Physician: Luna Salinas   PCP: Almira Jaeger, MD   Recommendations at discharge:  Follow-up with primary care provider Follow-up with spinal surgery if physical therapy fail to relieve your symptoms Please arrange thyroid  ultrasound for some concerning features on CT scan.  Discharge Diagnoses: Principal Problem:   Osteomyelitis of thoracic vertebra (HCC) Active Problems:   Low back pain without sciatica  Resolved Problems:   * No resolved hospital problems. *  Hospital Course: Franklin Hicks is a 73 y.o. male with PMH significant for morbid obesity, DM2, HTN, HLD, CAD/stent, h/o recurrent PE on Xarelto , generalized arthritis.  In 2019, patient had cervical spine fracture after a fall and underwent surgical fixation. At baseline, patient uses a walker to ambulate. For the last few months, patient has developed progressively worsening back pain. In December, he was seen by his PCP and referred to orthopedics.  It seems he had CT renal study obtained at that time which suggested nonobstructing 4 mm calculus in the lower pole of the left kidney and diffuse degenerative disc disease of the lumbar spine. He was started on muscle relaxant and sent to PT. Patient continued to have pain but was manageable but lately it is progressive and he has had significant limitation in mobility. With worsening symptoms, patient presented to the ED 4/21.   Initial workup with CT thoracic spine raised concern of discitis osteomyelitis at T11-T12.  MRI thoracic and lumbar spine were subsequently obtained.  They noted that the findings have been present since at least 07/2023.  Initially started on broad-spectrum antibiotics which was later discontinued when it was less likely an infection.  Osteomyelitis ruled out.  Imagings also showed T8-T11-L4 S1 ankylosis  as well as at least moderate spinal stenosis at T11-T12 with bilateral foraminal impingement involving T7-T8 to T11-12. These findings are most likely the cause of progressive pain.   Neurosurgery was consulted and they were recommending trying conservative management with pain control and physical therapy.  Physical therapy evaluated him and recommending CIR for rehab but unfortunately insurance denied.  Patient decided to go home with home health which was ordered.  Patient was given some oxycodone  and Robaxin  to help.  Imaging also noted heterogeneous and mildly enlarged thyroid  gland.   Recommend correlation with nonemergent thyroid  ultrasound, per radiology report.  That can be arranged by PCP.  Patient is also morbidly obese, encouraging weight loss will help with his backache.  Patient will continue the rest of his home medications and follow-up with his providers for further assistance.   Pain control - [place]  Controlled Substance Reporting System database was reviewed. and patient was instructed, not to drive, operate heavy machinery, perform activities at heights, swimming or participation in water activities or provide baby-sitting services while on Pain, Sleep and Anxiety Medications; until their outpatient Physician has advised to do so again. Also recommended to not to take more than prescribed Pain, Sleep and Anxiety Medications.  Consultants: Neurosurgery Procedures performed: None Disposition: Home health Diet recommendation:  Discharge Diet Orders (From admission, onward)     Start     Ordered   12/21/23 0000  Diet - low sodium heart healthy        12/21/23 1634           Cardiac and Carb modified diet DISCHARGE MEDICATION: Allergies as of 12/21/2023  Reactions   Ace Inhibitors    Angioedema   Gabapentin     Dizzy on 300 mg dose    Other Hives, Itching   msg        Medication List     STOP taking these medications    Potassium Citrate  15  MEQ (1620 MG) Tbcr       TAKE these medications    acetaminophen  650 MG CR tablet Commonly known as: TYLENOL  Take 650-1,300 mg by mouth every 8 (eight) hours as needed for pain.   ammonium lactate  12 % lotion Commonly known as: AmLactin Apply 1 application topically as needed for dry skin.   ascorbic acid 500 MG tablet Commonly known as: VITAMIN C Take 500 mg by mouth daily in the afternoon.   aspirin  EC 81 MG tablet Take 1 tablet (81 mg total) by mouth daily. What changed: when to take this   atorvastatin  80 MG tablet Commonly known as: LIPITOR  TAKE 1 TABLET BY MOUTH DAILY AT 6 PM.   CHROMIUM  PICOLATE PO Take 1 tablet by mouth 3 (three) times a week.   clotrimazole -betamethasone  cream Commonly known as: LOTRISONE  Apply to both lower extremities twice daily.   diclofenac  sodium 1 % Gel Commonly known as: VOLTAREN  Apply 1 application topically 3 (three) times daily. Both knees   docusate sodium  100 MG capsule Commonly known as: COLACE Take 1 capsule (100 mg total) by mouth 2 (two) times daily.   fluticasone  50 MCG/ACT nasal spray Commonly known as: FLONASE  SPRAY 2 SPRAYS INTO EACH NOSTRIL EVERY DAY   gabapentin  100 MG capsule Commonly known as: NEURONTIN  Take 1 capsule (100 mg total) by mouth 3 (three) times daily.   methocarbamol  500 MG tablet Commonly known as: ROBAXIN  Take 1 tablet (500 mg total) by mouth every 8 (eight) hours as needed for muscle spasms.   metoprolol  tartrate 50 MG tablet Commonly known as: LOPRESSOR  Take 1 tablet (50 mg total) by mouth 2 (two) times daily.   multivitamin with minerals Tabs tablet Take 1 tablet by mouth daily.   oxyCODONE  5 MG immediate release tablet Commonly known as: Oxy IR/ROXICODONE  Take 1 tablet (5 mg total) by mouth every 6 (six) hours as needed for moderate pain (pain score 4-6) or breakthrough pain.   polyethylene glycol 17 g packet Commonly known as: MIRALAX  / GLYCOLAX  Take 17 g by mouth daily as needed  for mild constipation.   rivaroxaban  20 MG Tabs tablet Commonly known as: Xarelto  Take 1 tablet (20 mg total) by mouth daily.   tacrolimus  0.1 % ointment Commonly known as: PROTOPIC  Apply to legs twice a day   terazosin  10 MG capsule Commonly known as: HYTRIN  TAKE 1 CAPSULE BY MOUTH EVERYDAY AT BEDTIME   tizanidine  2 MG capsule Commonly known as: ZANAFLEX  Take 1 capsule (2 mg total) by mouth 3 (three) times daily as needed for muscle spasms.   Vasculera Tabs Take 1 tablet once a day        Follow-up Information     [facility] Follow up.   Specialty: Home Health Services Contact information: [address] [place] [address] [phone]         Almira Jaeger, MD. Schedule an appointment as soon as possible for a visit in 1 week(s).   Specialty: Family Medicine Contact information: [address] [place] [address] [phone]                Discharge Exam: Franklin Hicks Weights  12/18/23 0500 12/20/23 0720 12/21/23 0714  Weight: (!) 170.6 kg (!) 166.4 kg (!) 169.5 kg   General.  Morbidly obese gentleman, in no acute distress. Pulmonary.  Lungs clear bilaterally, normal respiratory effort. CV.  Regular rate and rhythm, no JVD, rub or murmur. Abdomen.  Soft, nontender, nondistended, BS positive. CNS.  Alert and oriented .  No focal neurologic deficit. Extremities.  No edema, no cyanosis, pulses intact and symmetrical. Psychiatry.  Judgment and insight appears normal.   Condition at discharge: stable  The results of significant diagnostics from this hospitalization (including imaging, microbiology, ancillary and laboratory) are listed below for reference.   Imaging Studies: ECHOCARDIOGRAM COMPLETE Result Date: 12/18/2023    ECHOCARDIOGRAM REPORT   Patient Name:   Franklin Hicks Date of Exam: 12/18/2023 Medical Rec #:  [id number]   Height:       72.0 in Accession #:    [id number]  Weight:       376.1 lb Date of Birth:   08/07/1951   BSA:          2.787 m Patient Age:    72 years    BP:           139/64 mmHg Patient Gender: M           HR:           74 bpm. Exam Location:  Inpatient Procedure: 2D Echo, Cardiac Doppler and Color Doppler (Both Spectral and Color            Flow Doppler were utilized during procedure). Indications:    Abnormal ECG  History:        Patient has prior history of Echocardiogram examinations, most                 recent 04/23/2022. HFpEF, CAD and Previous Myocardial Infarction;                 Risk Factors:Dyslipidemia, Hypertension, Diabetes and Former                 Smoker.  Sonographer:    Adelia Homestead RVT RCS Referring Phys: [id number] [facility] (1-Rh)  Sonographer Comments: Patient is obese. Image acquisition challenging due to patient body habitus. Very poor and limited windows. Unable to obtain all necessay pictures. IMPRESSIONS  1. Left ventricular ejection fraction, by estimation, is 60 to 65%. The left ventricle has normal function. The left ventricle has no regional wall motion abnormalities. Left ventricular diastolic parameters are consistent with Grade I diastolic dysfunction (impaired relaxation).  2. Right ventricular systolic function is normal. The right ventricular size is normal.  3. The mitral valve is grossly normal. Trivial mitral valve regurgitation. No evidence of mitral stenosis.  4. The aortic valve was not well visualized. There is mild calcification of the aortic valve. Aortic valve regurgitation is not visualized. No aortic stenosis is present.  5. Aortic dilatation noted. There is borderline dilatation of the ascending aorta, measuring 39 mm. There is borderline dilatation of the aortic root, measuring 39 mm.  6. The inferior vena cava is normal in size with greater than 50% respiratory variability, suggesting right atrial pressure of 3 mmHg. Conclusion(s)/Recommendation(s): Technically limited study due to poor sound wave transmission. FINDINGS  Left Ventricle: Left ventricular  ejection fraction, by estimation, is 60 to 65%. The left ventricle has normal function. The left ventricle has no regional wall motion abnormalities. The left ventricular internal cavity size was normal in size. There is  no left ventricular  hypertrophy. Left ventricular diastolic parameters are consistent with Grade I diastolic dysfunction (impaired relaxation). Right Ventricle: The right ventricular size is normal. No increase in right ventricular wall thickness. Right ventricular systolic function is normal. Left Atrium: Left atrial size was normal in size. Right Atrium: Right atrial size was normal in size. Pericardium: There is no evidence of pericardial effusion. Presence of epicardial fat layer. Mitral Valve: The mitral valve is grossly normal. Trivial mitral valve regurgitation. No evidence of mitral valve stenosis. Tricuspid Valve: The tricuspid valve is normal in structure. Tricuspid valve regurgitation is trivial. No evidence of tricuspid stenosis. Aortic Valve: The aortic valve was not well visualized. There is mild calcification of the aortic valve. Aortic valve regurgitation is not visualized. No aortic stenosis is present. Pulmonic Valve: The pulmonic valve was not well visualized. Pulmonic valve regurgitation is not visualized. No evidence of pulmonic stenosis. Aorta: Aortic dilatation noted. There is borderline dilatation of the ascending aorta, measuring 39 mm. There is borderline dilatation of the aortic root, measuring 39 mm. Venous: The inferior vena cava is normal in size with greater than 50% respiratory variability, suggesting right atrial pressure of 3 mmHg. IAS/Shunts: No atrial level shunt detected by color flow Doppler.  LEFT VENTRICLE PLAX 2D LVIDd:         5.30 cm   Diastology LVIDs:         4.00 cm   LV e' medial:    6.87 cm/s LV PW:         1.60 cm   LV E/e' medial:  10.0 LV IVS:        0.90 cm   LV e' lateral:   8.79 cm/s LVOT diam:     2.40 cm   LV E/e' lateral: 7.8 LVOT Area:      4.52 cm  LEFT ATRIUM           Index LA diam:      3.60 cm 1.29 cm/m LA Vol (A4C): 45.2 ml 16.22 ml/m   AORTA Ao Root diam: 3.90 cm Ao Asc diam:  3.90 cm MITRAL VALVE MV Area (PHT): 3.31 cm    SHUNTS MV Decel Time: 229 msec    Systemic Diam: 2.40 cm MV E velocity: 68.90 cm/s MV A velocity: 72.80 cm/s MV E/A ratio:  0.95 Jules Oar MD Electronically signed by Jules Oar MD Signature Date/Time: 12/18/2023/3:08:51 PM    Final    MR Lumbar Spine W Wo Contrast Result Date: 12/15/2023 CLINICAL DATA:  Discitis versus osteomyelitis on CT. EXAM: MRI THORACIC AND LUMBAR SPINE WITHOUT. MRI THORACIC SPINE WITH CONTRAST TECHNIQUE: Multiplanar and multiecho pulse sequences of the thoracic and lumbar spine were obtained without contrast. Postcontrast imaging of the thoracic spine was acquired but could not be obtained due to patient condition at the lumbar spine. CONTRAST:  10mL GADAVIST  GADOBUTROL  1 MMOL/ML IV SOLN COMPARISON:  CT of the spine from yesterday. Abdominal CT 07/28/2023 FINDINGS: MRI THORACIC SPINE FINDINGS Alignment:  Straightening of thoracic curvature. Vertebrae: Mix of edematous and sclerotic signal around the T11-12 disc space with endplate irregularity and widening. Sclerosis also to a notable degree at the T9-10 and T10-11 levels where there is ankylosis by CT. Endplate destruction and sclerosis noted on 07/28/2023 abdominal CT, no paravertebral enhancement or edematous signal seen today, possibly old discitis versus neuropathic level. Incidental hemangioma at the T6 body. Cord: No cord edema or expansion seen. There is moderate cord encroachment at T11-12, difficult to assess on axial images given the degree of  motion and lack of localizing with the sagittal images. Paraspinal and other soft tissues: No paravertebral edema or collection. Goiter. Disc levels: Spondylitic spurring especially bulky at T8-9 and below with multilevel bridging. There is degenerative facet spurring at multiple  lower levels greatest at T10-11 where there is hypertrophy and ankylosis. Biforaminal impingement at T7-8 to T11-12. At least moderate spinal stenosis at T11-12. MRI LUMBAR SPINE FINDINGS Segmentation: Transitional level numbered S1 as numbered from above. Alignment:  Mild levocurvature.  No listhesis. Vertebrae: Intervertebral ankylosis at L4-5 and L5-S1. no acute fracture or aggressive bone lesion. Edematous signal at the anterior body of L3 likely related to osteophyte. Conus medullaris: Extends to the L2 level and appears normal. Paraspinal and other soft tissues: Prominent fatty atrophy of intrinsic back muscles Disc levels: L1-L2: Facet osteoarthritis with moderate hypertrophy. Mild bilateral foraminal narrowing L2-L3: Facet osteoarthritis with moderate hypertrophy and ligamentum flavum thickening. Biforaminal disc bulging with moderate foraminal stenosis greater on the right. Mild to moderate narrowing of the thecal sac from degeneration and short pedicles. L3-L4: Bulky facet osteoarthritis. Eccentric left disc bulging. Mild or moderate spinal stenosis from degeneration and short pedicles. L4-L5: Spurring and disc collapse with ankylosis. No neural impingement L5-S1:Bridging osteophytes especially bulky at the facets which encroach on the bilateral foramina to a moderate degree, greater on the right. IMPRESSION: Thoracic spine: 1. Little edema and enhancement at the level of T11-12 disc widening and endplate destruction which has been present by CT since at least 07/28/2023, favor neuropathic level or remote discitis. 2. T8-T11 ankylosis. 3. At least moderate spinal stenosis at T11-12. Bilateral foraminal impingement involving T7-8 to T11-12. Lumbar spine: 1. No acute finding. 2. L4-S1 ankylosis. 3. Short pedicles and degeneration cause mild-to-moderate spinal stenosis at L2-3 and L3-4. Electronically Signed   By: Ronnette Coke M.D.   On: 12/15/2023 10:44   MR THORACIC SPINE W WO CONTRAST Result Date:  12/15/2023 CLINICAL DATA:  Discitis versus osteomyelitis on CT. EXAM: MRI THORACIC AND LUMBAR SPINE WITHOUT. MRI THORACIC SPINE WITH CONTRAST TECHNIQUE: Multiplanar and multiecho pulse sequences of the thoracic and lumbar spine were obtained without contrast. Postcontrast imaging of the thoracic spine was acquired but could not be obtained due to patient condition at the lumbar spine. CONTRAST:  10mL GADAVIST  GADOBUTROL  1 MMOL/ML IV SOLN COMPARISON:  CT of the spine from yesterday. Abdominal CT 07/28/2023 FINDINGS: MRI THORACIC SPINE FINDINGS Alignment:  Straightening of thoracic curvature. Vertebrae: Mix of edematous and sclerotic signal around the T11-12 disc space with endplate irregularity and widening. Sclerosis also to a notable degree at the T9-10 and T10-11 levels where there is ankylosis by CT. Endplate destruction and sclerosis noted on 07/28/2023 abdominal CT, no paravertebral enhancement or edematous signal seen today, possibly old discitis versus neuropathic level. Incidental hemangioma at the T6 body. Cord: No cord edema or expansion seen. There is moderate cord encroachment at T11-12, difficult to assess on axial images given the degree of motion and lack of localizing with the sagittal images. Paraspinal and other soft tissues: No paravertebral edema or collection. Goiter. Disc levels: Spondylitic spurring especially bulky at T8-9 and below with multilevel bridging. There is degenerative facet spurring at multiple lower levels greatest at T10-11 where there is hypertrophy and ankylosis. Biforaminal impingement at T7-8 to T11-12. At least moderate spinal stenosis at T11-12. MRI LUMBAR SPINE FINDINGS Segmentation: Transitional level numbered S1 as numbered from above. Alignment:  Mild levocurvature.  No listhesis. Vertebrae: Intervertebral ankylosis at L4-5 and L5-S1. no acute fracture or aggressive  bone lesion. Edematous signal at the anterior body of L3 likely related to osteophyte. Conus medullaris:  Extends to the L2 level and appears normal. Paraspinal and other soft tissues: Prominent fatty atrophy of intrinsic back muscles Disc levels: L1-L2: Facet osteoarthritis with moderate hypertrophy. Mild bilateral foraminal narrowing L2-L3: Facet osteoarthritis with moderate hypertrophy and ligamentum flavum thickening. Biforaminal disc bulging with moderate foraminal stenosis greater on the right. Mild to moderate narrowing of the thecal sac from degeneration and short pedicles. L3-L4: Bulky facet osteoarthritis. Eccentric left disc bulging. Mild or moderate spinal stenosis from degeneration and short pedicles. L4-L5: Spurring and disc collapse with ankylosis. No neural impingement L5-S1:Bridging osteophytes especially bulky at the facets which encroach on the bilateral foramina to a moderate degree, greater on the right. IMPRESSION: Thoracic spine: 1. Little edema and enhancement at the level of T11-12 disc widening and endplate destruction which has been present by CT since at least 07/28/2023, favor neuropathic level or remote discitis. 2. T8-T11 ankylosis. 3. At least moderate spinal stenosis at T11-12. Bilateral foraminal impingement involving T7-8 to T11-12. Lumbar spine: 1. No acute finding. 2. L4-S1 ankylosis. 3. Short pedicles and degeneration cause mild-to-moderate spinal stenosis at L2-3 and L3-4. Electronically Signed   By: Ronnette Coke M.D.   On: 12/15/2023 10:44   CT LUMBAR SPINE W CONTRAST Result Date: 12/14/2023 CLINICAL DATA:  Thoracic radiculopathy with infection suspected EXAM: CT THORACIC AND LUMBAR SPINE WITH CONTRAST TECHNIQUE: Multidetector CT imaging of the thoracic and lumbar spine was performed with intravenous contrast administration. RADIATION DOSE REDUCTION: This exam was performed according to the departmental dose-optimization program which includes automated exposure control, adjustment of the mA and/or kV according to patient size and/or use of iterative reconstruction  technique. CONTRAST:  OMNIPAQUE  IOHEXOL  300 MG/ML  SOLN COMPARISON:  None Available. FINDINGS: Alignment: Normal. Vertebrae: There are sclerotic changes at the T9-T12 levels. At T11-12, there is widening of the disc space with opposing endplate erosion, most consistent with discitis-osteomyelitis. The L4-L5 vertebral bodies are fused anteriorly. Lumbar segmentation is normal. Paraspinal and other soft tissues: Heterogeneous and mildly enlarged thyroid  gland. Calcific aortic atherosclerosis. Disc levels: There is moderate spinal canal stenosis at the T11-12 level. There is severe facet hypertrophy at the L2-5 levels. This contributes to moderate-to-severe foraminal stenosis at L2-S1, greatest at the L5-S1 level. There is moderate spinal canal stenosis at the L3-S1 levels due to combination of degenerative disc and facet disease. IMPRESSION: 1. Findings most consistent with discitis-osteomyelitis at T11-12. Acuity is uncertain. MRI with and without contrast may be helpful for further characterization. 2. Moderate spinal canal stenosis at T11-12 and L3-S1. 3. Severe facet hypertrophy at the L2-5 levels with moderate-to-severe foraminal stenosis at L2-S1, greatest at the L5-S1 level. 4. Heterogeneous and mildly enlarged thyroid  gland. Recommend correlation with nonemergent thyroid  ultrasound. Aortic atherosclerosis (ICD10-I70.0). Electronically Signed   By: Juanetta Nordmann M.D.   On: 12/14/2023 22:49   CT THORACIC SPINE W CONTRAST Result Date: 12/14/2023 CLINICAL DATA:  Thoracic radiculopathy with infection suspected EXAM: CT THORACIC AND LUMBAR SPINE WITH CONTRAST TECHNIQUE: Multidetector CT imaging of the thoracic and lumbar spine was performed with intravenous contrast administration. RADIATION DOSE REDUCTION: This exam was performed according to the departmental dose-optimization program which includes automated exposure control, adjustment of the mA and/or kV according to patient size and/or use of iterative  reconstruction technique. CONTRAST:  OMNIPAQUE  IOHEXOL  300 MG/ML  SOLN COMPARISON:  None Available. FINDINGS: Alignment: Normal. Vertebrae: There are sclerotic changes at the T9-T12 levels.  At T11-12, there is widening of the disc space with opposing endplate erosion, most consistent with discitis-osteomyelitis. The L4-L5 vertebral bodies are fused anteriorly. Lumbar segmentation is normal. Paraspinal and other soft tissues: Heterogeneous and mildly enlarged thyroid  gland. Calcific aortic atherosclerosis. Disc levels: There is moderate spinal canal stenosis at the T11-12 level. There is severe facet hypertrophy at the L2-5 levels. This contributes to moderate-to-severe foraminal stenosis at L2-S1, greatest at the L5-S1 level. There is moderate spinal canal stenosis at the L3-S1 levels due to combination of degenerative disc and facet disease. IMPRESSION: 1. Findings most consistent with discitis-osteomyelitis at T11-12. Acuity is uncertain. MRI with and without contrast may be helpful for further characterization. 2. Moderate spinal canal stenosis at T11-12 and L3-S1. 3. Severe facet hypertrophy at the L2-5 levels with moderate-to-severe foraminal stenosis at L2-S1, greatest at the L5-S1 level. 4. Heterogeneous and mildly enlarged thyroid  gland. Recommend correlation with nonemergent thyroid  ultrasound. Aortic atherosclerosis (ICD10-I70.0). Electronically Signed   By: Juanetta Nordmann M.D.   On: 12/14/2023 22:49    Microbiology: Results for orders placed or performed during the hospital encounter of 12/14/23  Culture, blood (Routine X 2) w Reflex to ID Panel     Status: None   Collection Time: 12/15/23  3:18 AM   Specimen: BLOOD  Result Value Ref Range Status   Specimen Description   Final    BLOOD BLOOD LEFT FOREARM Performed at [facility], [address], [place], [place] [address]    Special Requests   Final    BOTTLES DRAWN AEROBIC AND ANAEROBIC Blood Culture results may not  be optimal due to an inadequate volume of blood received in culture bottles Performed at [facility], [address] [place], [place] [address]    Culture   Final    NO GROWTH 5 DAYS Performed at [facility], [address] [place], [place] [address]    Report Status 12/20/2023 FINAL  Final  Culture, blood (Routine X 2) w Reflex to ID Panel     Status: None   Collection Time: 12/15/23  2:49 PM   Specimen: BLOOD  Result Value Ref Range Status   Specimen Description   Final    BLOOD BLOOD RIGHT HAND Performed at [facility], [address] [place], [place] [address]    Special Requests   Final    BOTTLES DRAWN AEROBIC AND ANAEROBIC Blood Culture adequate volume Performed at [facility], [address] [place], [place] [address]    Culture   Final    NO GROWTH 5 DAYS Performed at [facility], [address] [place], [place] [address]    Report Status 12/20/2023 FINAL  Final    Labs: CBC: Recent Labs  Lab 12/15/23 0300 12/17/23 0400 12/18/23 0321  WBC 6.3 6.8 6.6  NEUTROABS  --  3.7 3.7  HGB 11.4* 11.9* 12.1*  HCT 36.2* 37.8* 38.9*  MCV 95.3 94.5 95.6  PLT 208 213 222   Basic Metabolic Panel: Recent Labs  Lab 12/15/23 0300 12/17/23 0400 12/18/23 0321  NA 134* 135 134*  K 3.5 4.1 4.0  CL 102 102 100  CO2 25 24 25   GLUCOSE 140* 140* 128*  BUN 20 18 17   CREATININE 1.15 0.95 0.97  CALCIUM  7.5* 8.2* 8.2*  MG 2.0  --   --   PHOS 3.4  --   --    Liver Function Tests: No results for input(s): "AST", "ALT", "  ALKPHOS", "BILITOT", "PROT", "ALBUMIN" in the last 168 hours. CBG: Recent Labs  Lab 12/20/23 1740 12/20/23 2142 12/21/23 0720 12/21/23 1131 12/21/23 1630  GLUCAP 133* 136* 123* 153* 129*    Discharge time spent: greater than 30 minutes.  This record has been created using Conservation officer, historic buildings. Errors have been sought and corrected,but may not always be located.  Such creation errors do not reflect on the standard of care.   Signed: Luna Salinas, MD [facility] 12/21/2023

## 2023-12-21 NOTE — TOC Transition Note (Addendum)
 Transition of Care [facility]) - Discharge Note   Patient Details  Name: Franklin Hicks MRN: [id number] Date of Birth: 1951/04/24  Transition of Care [facility]) CM/SW Contact:  Amaryllis Junior, LCSW Phone Number: 12/21/2023, 4:07 PM   Clinical Narrative:    Pt medically ready to dc home. Pt ins denied CIR. Pt will go home w/ HH. [facility] will be providing HHPT/OT services. [facility] info added to AVS. CSW notified MD need for HHPT/OT orders to be placed. No further TOC needs.   Final next level of care: Home w Home Health Services Barriers to Discharge: Barriers Resolved   Patient Goals and CMS Choice Patient states their goals for this hospitalization and ongoing recovery are:: return home w/ [facility] CMS Medicare.gov Compare Post Acute Care list provided to:: Patient Choice offered to / list presented to : Patient [place] ownership interest in [facility].provided to:: Patient    Discharge Placement                       Discharge Plan and Services Additional resources added to the After Visit Summary for   In-house Referral: NA   Post Acute Care Choice: IP Rehab          DME Arranged: N/A DME Agency: NA       HH Arranged: PT, OT HH Agency: [facility] Date [facility] Agency Contacted: 12/21/23 Time HH Agency Contacted: 1607 Representative spoke with at [facility] Agency: Randel Buss  Social Drivers of Health (SDOH) Interventions SDOH Screenings   Food Insecurity: No Food Insecurity (12/15/2023)  Housing: Low Risk  (12/15/2023)  Transportation Needs: No Transportation Needs (12/15/2023)  Utilities: Not At Risk (12/15/2023)  Depression (PHQ2-9): Low Risk  (09/16/2023)  Financial Resource Strain: Low Risk  (09/16/2023)  Physical Activity: Insufficiently Active (09/16/2023)  Social Connections: Moderately Integrated (12/15/2023)  Stress: No Stress Concern Present (09/16/2023)  Tobacco Use: Medium Risk (12/14/2023)  Health Literacy: Adequate Health Literacy (09/16/2023)      Readmission Risk Interventions    12/17/2023    3:05 PM  Readmission Risk Prevention Plan  Post Dischage Appt Complete  Medication Screening Complete  Transportation Screening Complete

## 2023-12-21 NOTE — Progress Notes (Signed)
 Occupational Therapy Treatment Patient Details Name: Franklin Hicks MRN: [id number] DOB: July 05, 1951 Today's Date: 12/21/2023   History of present illness Pt is 73 yo male admitted on 12/14/23 with intractable back pain initially suspected to have osteomyelitis of T11-T12. Pt has had pain for several months but progressively has worsened.  Neurosurgery consulted and no surgery indicated. Pt with hx including but not limited to morbid obesity, DM2, HTN, HLD, CAD, PE, arthritis.   OT comments  Patient seen for skilled OT session this am. Education ongoing for joint protection and body mechanics with activities outlined below. Patient extremely motivated and open to all therapy presented with family bedside and actively participating in care. Progressed activity tolerance and balance in standing for LB self care supported this session. Patient continues to require Acute level skilled OT services to progress functional performance. Patient will benefit from intensive inpatient follow-up therapy, >3 hours/day.         If plan is discharge home, recommend the following:  Assistance with cooking/housework;Help with stairs or ramp for entrance;A lot of help with bathing/dressing/bathroom;A little help with walking and/or transfers   Equipment Recommendations  Other (comment) (TBD after acute rehab venue of care recs)       Precautions / Restrictions Precautions Precautions: Back;Fall Restrictions Weight Bearing Restrictions Per Provider Order: No       Mobility Bed Mobility Overal bed mobility: Needs Assistance Bed Mobility: Supine to Sit, Sit to Supine Rolling: Min assist, Used rails Sidelying to sit: HOB elevated, Used rails, Min assist Supine to sit: Contact guard, HOB elevated, Used rails Sit to supine: HOB elevated, Used rails, Contact guard assist   General bed mobility comments: feels increased back pain releif with EOB and sit to stands    Transfers Overall transfer level: Needs  assistance Equipment used: Rolling walker (2 wheels) Transfers: Sit to/from Stand Sit to Stand: Contact guard assist           General transfer comment: seated and side stepping from EOB, defers recliner due to discomfort in the chair but tolerating EOB and motivated for all mobility     Balance Overall balance assessment: Needs assistance Sitting-balance support: Single extremity supported, Feet supported Sitting balance-[name]: Fair     Standing balance support: Bilateral upper extremity supported Standing balance-[name]: Poor Standing balance comment: leaning forward, CGA; use of RW                           ADL either performed or assessed with clinical judgement   ADL Overall ADL's : Needs assistance/impaired Eating/Feeding: Independent   Grooming: Set up;Wash/dry hands;Wash/dry face;Oral care;Sitting       Lower Body Bathing: Moderate assistance;Sitting/lateral leans;Sit to/from stand Lower Body Bathing Details (indicate cue type and reason): sit to stand and standing tolerance > 1 minute with HDRW     Lower Body Dressing: Moderate assistance;Sitting/lateral leans;Sit to/from stand Lower Body Dressing Details (indicate cue type and reason): worked this session on simulated functional reach to feet with AE for lower body garment management, sit to stands for pants management             Functional mobility during ADLs: Contact guard assist;Rolling walker (2 wheels) (heavy duty RW) General ADL Comments: patient extrememly motivated to complete all activity he can with modified techniques    Extremity/Trunk Assessment Upper Extremity Assessment Upper Extremity Assessment: Right hand dominant (educated this session on joint protection techniques) RUE Deficits / Details: chronic shoulder  AROM limitations, otherwise AROM for elbow and hand WFL. Functional grip strength LUE Deficits / Details: chronic shoulder AROM limitations, otherwise AROM for  elbow and hand WFL. Functional grip strength   Lower Extremity Assessment Lower Extremity Assessment: Defer to PT evaluation (maintains L LE in knee extension)        Vision   Vision Assessment?: No apparent visual deficits   Perception Perception Perception: Within Functional Limits   Praxis Praxis Praxis: WFL   Communication Communication Communication: No apparent difficulties   Cognition Arousal: Alert Behavior During Therapy: WFL for tasks assessed/performed Cognition: No apparent impairments                               Following commands: Intact                 General Comments B compression hose in place, slip on shoes appled EOB for safety    Pertinent Vitals/ Pain       Pain Assessment Pain Assessment: 0-10 Pain Score: 3  Pain Location: back mm, L abdomen/flank Pain Descriptors / Indicators: Tightness Pain Intervention(s): Monitored during session, Patient requesting pain meds-RN notified, Relaxation, Repositioned   Frequency  Min 2X/week        Progress Toward Goals  OT Goals(current goals can now be found in the care plan section)  Progress towards OT goals: Progressing toward goals  Acute Rehab OT Goals Patient Stated Goal: to keep progressing OT Goal Formulation: With patient/family Time For Goal Achievement: 01/01/24 Potential to Achieve Goals: Good ADL Goals Pt Will Perform Lower Body Dressing: with supervision;with adaptive equipment;sitting/lateral leans;sit to/from stand Pt Will Transfer to Toilet: with supervision;ambulating Pt Will Perform Toileting - Clothing Manipulation and hygiene: with supervision;sit to/from stand  Plan         AM-PAC OT "6 Clicks" Daily Activity     Outcome Measure   Help from another person eating meals?: None Help from another person taking care of personal grooming?: A Little Help from another person toileting, which includes using toliet, bedpan, or urinal?: A Lot Help from another  person bathing (including washing, rinsing, drying)?: A Lot Help from another person to put on and taking off regular upper body clothing?: A Little Help from another person to put on and taking off regular lower body clothing?: A Lot 6 Click Score: 16    End of Session Equipment Utilized During Treatment: Rolling walker (2 wheels)  OT Visit Diagnosis: Unsteadiness on feet (R26.81);Other abnormalities of gait and mobility (R26.89);Pain   Activity Tolerance Patient tolerated treatment well   Patient Left in bed;with call bell/phone within reach   Nurse Communication Mobility status        Time: 7846-9629 OT Time Calculation (min): 28 min  Charges: OT General Charges $OT Visit: 1 Visit OT Treatments $Self Care/Home Management : 8-22 mins $Therapeutic Activity: 8-22 mins  Tila Millirons OT/L Acute Rehabilitation Department  [phone]  12/21/2023, 9:40 AM

## 2023-12-21 NOTE — Progress Notes (Signed)
 Inpatient Rehab Admissions Coordinator:   Received fax that insurance has denied CIR stay. I called Mr. Sapp and let him know. Patient would like to get home health or outpatient therapy. Will sign off at this time. TOC and medical team notified.   Rehab Admissons Coordinator Adamari Frede, [place], [place] [phone]

## 2023-12-23 DIAGNOSIS — I5042 Chronic combined systolic (congestive) and diastolic (congestive) heart failure: Secondary | ICD-10-CM | POA: Diagnosis not present

## 2023-12-23 DIAGNOSIS — M48061 Spinal stenosis, lumbar region without neurogenic claudication: Secondary | ICD-10-CM | POA: Diagnosis not present

## 2023-12-23 DIAGNOSIS — M4804 Spinal stenosis, thoracic region: Secondary | ICD-10-CM | POA: Diagnosis not present

## 2023-12-23 DIAGNOSIS — G8929 Other chronic pain: Secondary | ICD-10-CM | POA: Diagnosis not present

## 2023-12-23 DIAGNOSIS — I110 Hypertensive heart disease with heart failure: Secondary | ICD-10-CM | POA: Diagnosis not present

## 2023-12-23 DIAGNOSIS — M51369 Other intervertebral disc degeneration, lumbar region without mention of lumbar back pain or lower extremity pain: Secondary | ICD-10-CM | POA: Diagnosis not present

## 2023-12-23 DIAGNOSIS — M4807 Spinal stenosis, lumbosacral region: Secondary | ICD-10-CM | POA: Diagnosis not present

## 2023-12-23 DIAGNOSIS — M47816 Spondylosis without myelopathy or radiculopathy, lumbar region: Secondary | ICD-10-CM | POA: Diagnosis not present

## 2023-12-23 DIAGNOSIS — M2469 Ankylosis, other specified joint: Secondary | ICD-10-CM | POA: Diagnosis not present

## 2023-12-25 ENCOUNTER — Telehealth: Payer: Self-pay

## 2023-12-25 NOTE — Telephone Encounter (Signed)
 Returned the call to [facility] and provided VO.  Copied from CRM [id number]. Topic: Clinical - Home Health Verbal Orders >> Dec 25, 2023 10:29 AM Magdalene School wrote: Caller/Agency: Fanny Homme from [facility] physical threapy  Callback Number: [phone] Service Requested: Physical Therapy Frequency: once a week for one week and twice a week for four weeks  Any new concerns about the patient? No

## 2023-12-28 DIAGNOSIS — M4804 Spinal stenosis, thoracic region: Secondary | ICD-10-CM | POA: Diagnosis not present

## 2023-12-28 DIAGNOSIS — M51369 Other intervertebral disc degeneration, lumbar region without mention of lumbar back pain or lower extremity pain: Secondary | ICD-10-CM | POA: Diagnosis not present

## 2023-12-28 DIAGNOSIS — G8929 Other chronic pain: Secondary | ICD-10-CM | POA: Diagnosis not present

## 2023-12-28 DIAGNOSIS — M4807 Spinal stenosis, lumbosacral region: Secondary | ICD-10-CM | POA: Diagnosis not present

## 2023-12-28 DIAGNOSIS — M48061 Spinal stenosis, lumbar region without neurogenic claudication: Secondary | ICD-10-CM | POA: Diagnosis not present

## 2023-12-28 DIAGNOSIS — M47816 Spondylosis without myelopathy or radiculopathy, lumbar region: Secondary | ICD-10-CM | POA: Diagnosis not present

## 2023-12-28 DIAGNOSIS — I5042 Chronic combined systolic (congestive) and diastolic (congestive) heart failure: Secondary | ICD-10-CM | POA: Diagnosis not present

## 2023-12-28 DIAGNOSIS — I110 Hypertensive heart disease with heart failure: Secondary | ICD-10-CM | POA: Diagnosis not present

## 2023-12-28 DIAGNOSIS — M2469 Ankylosis, other specified joint: Secondary | ICD-10-CM | POA: Diagnosis not present

## 2023-12-29 ENCOUNTER — Telehealth: Payer: Self-pay | Admitting: Family Medicine

## 2023-12-29 NOTE — Telephone Encounter (Signed)
 Forms completed and faxed back.

## 2023-12-29 NOTE — Telephone Encounter (Signed)
 Received faxed  document Home Health Certificate (Order ID [id number] ), to be filled out by provider. Patient requested to send it back via Fax .  Document is located in providers tray at front office.Please advise

## 2024-01-01 ENCOUNTER — Encounter (HOSPITAL_COMMUNITY): Payer: Self-pay

## 2024-01-01 DIAGNOSIS — I110 Hypertensive heart disease with heart failure: Secondary | ICD-10-CM | POA: Diagnosis not present

## 2024-01-01 DIAGNOSIS — M51369 Other intervertebral disc degeneration, lumbar region without mention of lumbar back pain or lower extremity pain: Secondary | ICD-10-CM | POA: Diagnosis not present

## 2024-01-01 DIAGNOSIS — M4807 Spinal stenosis, lumbosacral region: Secondary | ICD-10-CM | POA: Diagnosis not present

## 2024-01-01 DIAGNOSIS — M48061 Spinal stenosis, lumbar region without neurogenic claudication: Secondary | ICD-10-CM | POA: Diagnosis not present

## 2024-01-01 DIAGNOSIS — G8929 Other chronic pain: Secondary | ICD-10-CM | POA: Diagnosis not present

## 2024-01-01 DIAGNOSIS — M47816 Spondylosis without myelopathy or radiculopathy, lumbar region: Secondary | ICD-10-CM | POA: Diagnosis not present

## 2024-01-01 DIAGNOSIS — I5042 Chronic combined systolic (congestive) and diastolic (congestive) heart failure: Secondary | ICD-10-CM | POA: Diagnosis not present

## 2024-01-01 DIAGNOSIS — M4804 Spinal stenosis, thoracic region: Secondary | ICD-10-CM | POA: Diagnosis not present

## 2024-01-01 DIAGNOSIS — M2469 Ankylosis, other specified joint: Secondary | ICD-10-CM | POA: Diagnosis not present

## 2024-01-05 DIAGNOSIS — M4807 Spinal stenosis, lumbosacral region: Secondary | ICD-10-CM | POA: Diagnosis not present

## 2024-01-05 DIAGNOSIS — G8929 Other chronic pain: Secondary | ICD-10-CM | POA: Diagnosis not present

## 2024-01-05 DIAGNOSIS — I110 Hypertensive heart disease with heart failure: Secondary | ICD-10-CM | POA: Diagnosis not present

## 2024-01-05 DIAGNOSIS — I5042 Chronic combined systolic (congestive) and diastolic (congestive) heart failure: Secondary | ICD-10-CM | POA: Diagnosis not present

## 2024-01-05 DIAGNOSIS — M47816 Spondylosis without myelopathy or radiculopathy, lumbar region: Secondary | ICD-10-CM | POA: Diagnosis not present

## 2024-01-05 DIAGNOSIS — M2469 Ankylosis, other specified joint: Secondary | ICD-10-CM | POA: Diagnosis not present

## 2024-01-05 DIAGNOSIS — M4804 Spinal stenosis, thoracic region: Secondary | ICD-10-CM | POA: Diagnosis not present

## 2024-01-05 DIAGNOSIS — M51369 Other intervertebral disc degeneration, lumbar region without mention of lumbar back pain or lower extremity pain: Secondary | ICD-10-CM | POA: Diagnosis not present

## 2024-01-05 DIAGNOSIS — M48061 Spinal stenosis, lumbar region without neurogenic claudication: Secondary | ICD-10-CM | POA: Diagnosis not present

## 2024-01-06 DIAGNOSIS — I110 Hypertensive heart disease with heart failure: Secondary | ICD-10-CM | POA: Diagnosis not present

## 2024-01-06 DIAGNOSIS — M4807 Spinal stenosis, lumbosacral region: Secondary | ICD-10-CM | POA: Diagnosis not present

## 2024-01-06 DIAGNOSIS — G8929 Other chronic pain: Secondary | ICD-10-CM | POA: Diagnosis not present

## 2024-01-06 DIAGNOSIS — M48061 Spinal stenosis, lumbar region without neurogenic claudication: Secondary | ICD-10-CM | POA: Diagnosis not present

## 2024-01-06 DIAGNOSIS — M47816 Spondylosis without myelopathy or radiculopathy, lumbar region: Secondary | ICD-10-CM | POA: Diagnosis not present

## 2024-01-06 DIAGNOSIS — M51369 Other intervertebral disc degeneration, lumbar region without mention of lumbar back pain or lower extremity pain: Secondary | ICD-10-CM | POA: Diagnosis not present

## 2024-01-06 DIAGNOSIS — I5042 Chronic combined systolic (congestive) and diastolic (congestive) heart failure: Secondary | ICD-10-CM | POA: Diagnosis not present

## 2024-01-06 DIAGNOSIS — M4804 Spinal stenosis, thoracic region: Secondary | ICD-10-CM | POA: Diagnosis not present

## 2024-01-06 DIAGNOSIS — M2469 Ankylosis, other specified joint: Secondary | ICD-10-CM | POA: Diagnosis not present

## 2024-01-07 DIAGNOSIS — M4804 Spinal stenosis, thoracic region: Secondary | ICD-10-CM | POA: Diagnosis not present

## 2024-01-07 DIAGNOSIS — M4807 Spinal stenosis, lumbosacral region: Secondary | ICD-10-CM | POA: Diagnosis not present

## 2024-01-07 DIAGNOSIS — I5042 Chronic combined systolic (congestive) and diastolic (congestive) heart failure: Secondary | ICD-10-CM | POA: Diagnosis not present

## 2024-01-07 DIAGNOSIS — M48061 Spinal stenosis, lumbar region without neurogenic claudication: Secondary | ICD-10-CM | POA: Diagnosis not present

## 2024-01-07 DIAGNOSIS — I110 Hypertensive heart disease with heart failure: Secondary | ICD-10-CM | POA: Diagnosis not present

## 2024-01-07 DIAGNOSIS — G8929 Other chronic pain: Secondary | ICD-10-CM | POA: Diagnosis not present

## 2024-01-07 DIAGNOSIS — M51369 Other intervertebral disc degeneration, lumbar region without mention of lumbar back pain or lower extremity pain: Secondary | ICD-10-CM | POA: Diagnosis not present

## 2024-01-07 DIAGNOSIS — M47816 Spondylosis without myelopathy or radiculopathy, lumbar region: Secondary | ICD-10-CM | POA: Diagnosis not present

## 2024-01-07 DIAGNOSIS — M2469 Ankylosis, other specified joint: Secondary | ICD-10-CM | POA: Diagnosis not present

## 2024-01-08 ENCOUNTER — Encounter: Payer: Self-pay | Admitting: Family Medicine

## 2024-01-08 ENCOUNTER — Ambulatory Visit: Admitting: Family Medicine

## 2024-01-08 VITALS — BP 130/78 | HR 74 | Temp 98.0°F | Ht 72.0 in

## 2024-01-08 DIAGNOSIS — Z6841 Body Mass Index (BMI) 40.0 and over, adult: Secondary | ICD-10-CM | POA: Diagnosis not present

## 2024-01-08 DIAGNOSIS — S14104S Unspecified injury at C4 level of cervical spinal cord, sequela: Secondary | ICD-10-CM

## 2024-01-08 DIAGNOSIS — M4804 Spinal stenosis, thoracic region: Secondary | ICD-10-CM

## 2024-01-08 DIAGNOSIS — E010 Iodine-deficiency related diffuse (endemic) goiter: Secondary | ICD-10-CM | POA: Diagnosis not present

## 2024-01-08 DIAGNOSIS — I2699 Other pulmonary embolism without acute cor pulmonale: Secondary | ICD-10-CM | POA: Diagnosis not present

## 2024-01-08 MED ORDER — METHOCARBAMOL 500 MG PO TABS: 500.0000 mg | ORAL_TABLET | Freq: Three times a day (TID) | ORAL | 2 refills | Status: DC | PRN

## 2024-01-08 MED ORDER — DOCUSATE SODIUM 100 MG PO CAPS
100.0000 mg | ORAL_CAPSULE | Freq: Two times a day (BID) | ORAL | 5 refills | Status: DC
Start: 2024-01-08 — End: 2024-06-29

## 2024-01-08 NOTE — Patient Instructions (Addendum)
 We have placed a referral for you today to Dr. Larrie Po with [facility]- please call their # if you do not hear within a week  [phone]  We will call you within two weeks about your referral for thyroid  ultrasound through [facility].  Their phone number is [phone].  Please call them if you have not heard in 1-2 weeks  I'm willing to refill oxycodone  for sparing use between now and next visit but lets space out as much as we can   Recommended follow up: Return for next already scheduled visit or sooner if needed.

## 2024-01-08 NOTE — Progress Notes (Signed)
 Phone [phone] In person visit   Subjective:   Franklin Hicks is a 73 y.o. year old very pleasant male patient who presents for/with See problem oriented charting Chief Complaint  Patient presents with   hosp f/u    Pt is here for hosp f/u due to back pain.    Past Medical History-  Patient Active Problem List   Diagnosis Date Noted   Recurrent pulmonary embolism (HCC)     Priority: High   C4 spinal cord injury, sequela (HCC) 12/18/2017    Priority: High   Tetraplegia (HCC) 12/18/2017    Priority: High   Aortic aneurysm (HCC) 12/16/2017    Priority: High   Cord compression (HCC) 12/13/2017    Priority: High   Chronic combined systolic and diastolic heart failure (HCC) 10/09/2016    Priority: High   CAD S/P percutaneous coronary angioplasty 09/20/2016    Priority: High   Morbid obesity (HCC) 07/18/2013    Priority: High   Diabetes mellitus type 2, controlled (HCC) 07/17/2010    Priority: High   Bilateral lower extremity edema 12/08/2008    Priority: High   Chronic kidney disease, stage 3a (HCC) 07/17/2023    Priority: Medium    Angioedema of lips 07/13/2013    Priority: Medium    Hyperlipidemia associated with type 2 diabetes mellitus (HCC) 10/02/2011    Priority: Medium    BPH with urinary obstruction 07/17/2010    Priority: Medium    Gout 03/15/2007    Priority: Medium    Essential hypertension 03/15/2007    Priority: Medium    Pain of left heel     Priority: Low   Primary osteoarthritis of knees, bilateral 01/11/2016    Priority: Low   Erectile dysfunction 07/30/2015    Priority: Low   Multinodular goiter 07/13/2013    Priority: Low   SOB (shortness of breath) 10/02/2011    Priority: Low   NEUROPATHY, IDIOPATHIC PERIPHERAL NEC 05/31/2007    Priority: Low   Osteoarthritis 03/22/2007    Priority: Low   Osteomyelitis of thoracic vertebra (HCC) 12/15/2023   Cholelithiases 08/09/2023   Fatty liver 08/09/2023   Peripheral arterial disease (HCC)  08/09/2023   Prostatic enlargement 08/09/2023   DDD (degenerative disc disease), lumbar 08/09/2023   Preoperative cardiovascular examination 02/05/2021   Edema 12/08/2008   Low back pain without sciatica 11/10/2007    Medications- reviewed and updated Current Outpatient Medications  Medication Sig Dispense Refill   acetaminophen  (TYLENOL ) 650 MG CR tablet Take 650-1,300 mg by mouth every 8 (eight) hours as needed for pain.     ammonium lactate  (AMLACTIN) 12 % lotion Apply 1 application topically as needed for dry skin. 400 g 0   aspirin  EC 81 MG tablet Take 1 tablet (81 mg total) by mouth daily. (Patient taking differently: Take 81 mg by mouth in the morning.) 90 tablet 3   atorvastatin  (LIPITOR ) 80 MG tablet TAKE 1 TABLET BY MOUTH DAILY AT 6 PM. 90 tablet 3   Chromium  Picolinate (CHROMIUM  PICOLATE PO) Take 1 tablet by mouth 3 (three) times a week.     clotrimazole -betamethasone  (LOTRISONE ) cream Apply to both lower extremities twice daily. 45 g 3   diclofenac  sodium (VOLTAREN ) 1 % GEL Apply 1 application topically 3 (three) times daily. Both knees 3 Tube 4   Dietary Management Product (VASCULERA) TABS Take 1 tablet once a day 30 tablet 2   fluticasone  (FLONASE ) 50 MCG/ACT nasal spray SPRAY 2 SPRAYS INTO EACH NOSTRIL EVERY DAY 48  mL 1   gabapentin  (NEURONTIN ) 100 MG capsule Take 1 capsule (100 mg total) by mouth 3 (three) times daily. 90 capsule 1   metoprolol  tartrate (LOPRESSOR ) 50 MG tablet Take 1 tablet (50 mg total) by mouth 2 (two) times daily. 180 tablet 3   Multiple Vitamin (MULTIVITAMIN WITH MINERALS) TABS tablet Take 1 tablet by mouth daily.     polyethylene glycol (MIRALAX  / GLYCOLAX ) 17 g packet Take 17 g by mouth daily as needed for mild constipation. 14 each 0   rivaroxaban  (XARELTO ) 20 MG TABS tablet Take 1 tablet (20 mg total) by mouth daily. 30 tablet 11   tacrolimus  (PROTOPIC ) 0.1 % ointment Apply to legs twice a day 100 g 2   terazosin  (HYTRIN ) 10 MG capsule TAKE 1  CAPSULE BY MOUTH EVERYDAY AT BEDTIME 90 capsule 3   vitamin C (ASCORBIC ACID) 500 MG tablet Take 500 mg by mouth daily in the afternoon.     docusate sodium  (COLACE) 100 MG capsule Take 1 capsule (100 mg total) by mouth 2 (two) times daily. 60 capsule 5   methocarbamol  (ROBAXIN ) 500 MG tablet Take 1 tablet (500 mg total) by mouth every 8 (eight) hours as needed for muscle spasms. 60 tablet 2   oxyCODONE  (OXY IR/ROXICODONE ) 5 MG immediate release tablet Take 1 tablet (5 mg total) by mouth every 6 (six) hours as needed for moderate pain (pain score 4-6) or breakthrough pain. (Patient not taking: Reported on 01/08/2024) 30 tablet 0   No current facility-administered medications for this visit.     Objective:  BP 130/78   Pulse 74   Temp 98 F (36.7 C)   Ht 6' (1.829 m)   SpO2 94%   BMI 50.68 kg/m  Gen: NAD, resting comfortably CV: RRR no murmurs rubs or gallops Lungs: CTAB no crackles, wheeze, rhonchi Ext: Chronic edema Skin: warm, dry Wheelchair-bound    Assessment and Plan    # Hospital follow-up for thoracic and low back pain S: Patient presented to the hospital on 12/14/2023 and was eventually discharged on 12/21/2023.  Initially there was concern for osteomyelitis.On further evaluation-imaging findings were largely unchanged form previous- neurosurgery determined this was unlikelyto be infectious. Findings thought due to "Imagings also showed T8-T11-L4 S1 ankylosis as well as at least moderate spinal stenosis at T11-T12 with bilateral foraminal impingement involving T7-T8 to T11-12. These findings are most likely the cause of progressive pain. " -conservative therapy recommended and plan physical therapy but insurance denied- went home with home health and given robaxin - has been more helpful than tizanidine . Gabapentin  helping some too - has helped -has oxycycodone but trying to use very sparingly. Miralax  and colace.  Therapist helping with tension in abdominal musculature- tight  but no spasms A/P: Patient with thoracic and low back pain and findings of spinal stenosis at T11-T12 and bilateral foraminal impingement at T7-T8 and T11-T12 potentially cause of his pain.  Did have inpatient neurosurgical consult but he had been told the plan would be outpatient consultation as well after trial of conservative care - He is having some improvement with physical therapy but still with rather significant pain and he would like to follow-up with neurosurgery-referral was placed to Dr. Larrie Po for his opinion - From our end we will refill methocarbamol  since this has been more helpful than tizanidine .  He is trying to use the oxycodone  sparingly and I think that is a good idea but I am willing to refill it if needed before next visit-he will  use Colace for constipation particular if he uses this - We also discussed importance of weight loss and would strongly consider medication like Mounjaro but he wants to think this over a little more-I also think it would benefit his diabetes   # Thyromegaly-noted incidentally on imaging and the recommended outpatient follow-up and we ordered an ultrasound today.  His TSH was normal on 12/15/2023  # Diabetes with morbid obesity S: Medication:none but has lost 16 pounds in last few months by try to make dietary changes Lab Results  Component Value Date   HGBA1C 7.0 (H) 12/01/2023   HGBA1C 7.4 (H) 07/30/2023   HGBA1C 7.5 (H) 02/02/2023    A/P: Suspect A1c is improving with weight loss-I still think he would benefit from more substantial weight loss and discussed medication like Mounjaro-he declines at this time.  He will let me know if he changes his mind wants to move forward with this-congratulated him on weight loss efforts so far   % Cord compression/tetraplegia/sequela of C4 spinal cord injury- patient with severe injury April 2019- has used wheelchair since that time-is still able to transport self into his vehicle.  l.Follows with Dr.  Emelie Hanger now as needed  # Recurrent pulmonary embolism  S: Prior DVT 2014 after traveling- believe led to PE. Recurrent PE 11/2017 after fall. Now on lifelong anticoagulation-Xarelto  20 mg A/P:  pulmonary embolism stabilized by chronic Xarelto -continue current medication  Recommended follow up: Return for next already scheduled visit or sooner if needed. Future Appointments  Date Time Provider Department Center  01/12/2024  3:15 PM Luella Sager, [place] [facility]  02/18/2024 12:00 PM Elta Halter, MD [facility] None  04/04/2024  3:20 PM Almira Jaeger, MD [facility]  09/20/2024  1:40 PM [facility] ANNUAL WELLNESS VISIT 1 [facility]    Lab/Order associations:   ICD-10-CM   1. Thoracic spinal stenosis  M48.04 Ambulatory referral to Neurosurgery    2. Thyromegaly  E01.0 US  THYROID     3. Morbid obesity (HCC) Chronic E66.01     4. C4 spinal cord injury, sequela (HCC)  S14.104S     5. Recurrent pulmonary embolism (HCC)  I26.99       Meds ordered this encounter  Medications   methocarbamol  (ROBAXIN ) 500 MG tablet    Sig: Take 1 tablet (500 mg total) by mouth every 8 (eight) hours as needed for muscle spasms.    Dispense:  60 tablet    Refill:  2   docusate sodium  (COLACE) 100 MG capsule    Sig: Take 1 capsule (100 mg total) by mouth 2 (two) times daily.    Dispense:  60 capsule    Refill:  5    Return precautions advised.  Clarisa Crooked, MD

## 2024-01-12 ENCOUNTER — Ambulatory Visit (INDEPENDENT_AMBULATORY_CARE_PROVIDER_SITE_OTHER): Admitting: Podiatry

## 2024-01-12 ENCOUNTER — Encounter: Payer: Self-pay | Admitting: Podiatry

## 2024-01-12 VITALS — Ht 72.0 in | Wt 373.7 lb

## 2024-01-12 DIAGNOSIS — L851 Acquired keratosis [keratoderma] palmaris et plantaris: Secondary | ICD-10-CM | POA: Diagnosis not present

## 2024-01-12 DIAGNOSIS — E1142 Type 2 diabetes mellitus with diabetic polyneuropathy: Secondary | ICD-10-CM

## 2024-01-12 DIAGNOSIS — I872 Venous insufficiency (chronic) (peripheral): Secondary | ICD-10-CM | POA: Diagnosis not present

## 2024-01-12 DIAGNOSIS — M79674 Pain in right toe(s): Secondary | ICD-10-CM

## 2024-01-12 DIAGNOSIS — B351 Tinea unguium: Secondary | ICD-10-CM | POA: Diagnosis not present

## 2024-01-12 DIAGNOSIS — M79675 Pain in left toe(s): Secondary | ICD-10-CM | POA: Diagnosis not present

## 2024-01-13 DIAGNOSIS — G8929 Other chronic pain: Secondary | ICD-10-CM | POA: Diagnosis not present

## 2024-01-13 DIAGNOSIS — M4804 Spinal stenosis, thoracic region: Secondary | ICD-10-CM | POA: Diagnosis not present

## 2024-01-13 DIAGNOSIS — M4807 Spinal stenosis, lumbosacral region: Secondary | ICD-10-CM | POA: Diagnosis not present

## 2024-01-13 DIAGNOSIS — M2469 Ankylosis, other specified joint: Secondary | ICD-10-CM | POA: Diagnosis not present

## 2024-01-13 DIAGNOSIS — M47816 Spondylosis without myelopathy or radiculopathy, lumbar region: Secondary | ICD-10-CM | POA: Diagnosis not present

## 2024-01-13 DIAGNOSIS — I110 Hypertensive heart disease with heart failure: Secondary | ICD-10-CM | POA: Diagnosis not present

## 2024-01-13 DIAGNOSIS — M51369 Other intervertebral disc degeneration, lumbar region without mention of lumbar back pain or lower extremity pain: Secondary | ICD-10-CM | POA: Diagnosis not present

## 2024-01-13 DIAGNOSIS — I5042 Chronic combined systolic (congestive) and diastolic (congestive) heart failure: Secondary | ICD-10-CM | POA: Diagnosis not present

## 2024-01-13 DIAGNOSIS — M48061 Spinal stenosis, lumbar region without neurogenic claudication: Secondary | ICD-10-CM | POA: Diagnosis not present

## 2024-01-15 DIAGNOSIS — I110 Hypertensive heart disease with heart failure: Secondary | ICD-10-CM | POA: Diagnosis not present

## 2024-01-15 DIAGNOSIS — M51369 Other intervertebral disc degeneration, lumbar region without mention of lumbar back pain or lower extremity pain: Secondary | ICD-10-CM | POA: Diagnosis not present

## 2024-01-15 DIAGNOSIS — G8929 Other chronic pain: Secondary | ICD-10-CM | POA: Diagnosis not present

## 2024-01-15 DIAGNOSIS — M48061 Spinal stenosis, lumbar region without neurogenic claudication: Secondary | ICD-10-CM | POA: Diagnosis not present

## 2024-01-15 DIAGNOSIS — M4807 Spinal stenosis, lumbosacral region: Secondary | ICD-10-CM | POA: Diagnosis not present

## 2024-01-15 DIAGNOSIS — M4804 Spinal stenosis, thoracic region: Secondary | ICD-10-CM | POA: Diagnosis not present

## 2024-01-15 DIAGNOSIS — I5042 Chronic combined systolic (congestive) and diastolic (congestive) heart failure: Secondary | ICD-10-CM | POA: Diagnosis not present

## 2024-01-15 DIAGNOSIS — M2469 Ankylosis, other specified joint: Secondary | ICD-10-CM | POA: Diagnosis not present

## 2024-01-15 DIAGNOSIS — M47816 Spondylosis without myelopathy or radiculopathy, lumbar region: Secondary | ICD-10-CM | POA: Diagnosis not present

## 2024-01-18 NOTE — Progress Notes (Signed)
 Subjective:  Patient ID: Franklin Hicks, male    DOB: 08/04/1951,  MRN: [id number]  Franklin Hicks presents to clinic today for at risk foot care with history of diabetic neuropathy and painful, elongated thickened toenails x 10 which are symptomatic when wearing enclosed shoe gear. This interferes with his/her daily activities. He is continuing to see Derm for stasis dermatitis, elephantiasis nostra, edema and dyschromia.  He is using compression therapy, Urea 40% cream, Tacrolimus  cream and Vasculera.  Patient states he has been extremely compliant with the Derm treatment plan. Chief Complaint  Patient presents with   Nail Problem    Pt is here for [facility] unsure of last A1C PCP is Dr Arlene Ben and LOV was this week.   New problem(s): None.   PCP is Almira Jaeger, MD.  Allergies  Allergen Reactions   Ace Inhibitors     Angioedema   Gabapentin      Dizzy on 300 mg dose    Other Hives and Itching    msg    Review of Systems: Negative except as noted in the HPI.  Objective: No changes noted in today's physical examination. There were no vitals filed for this visit. Franklin Hicks is a pleasant 73 y.o. male morbidly obese in NAD. AAO x 3.  Vascular Examination: Capillary refill time immediate b/l. Vascular status intact b/l with palpable pedal pulses. Pedal hair absent b/l. Chronic venous insufficiency noted b/l LE with associated skin changes noted. No pain with calf compression b/l. Skin temperature gradient WNL b/l. No ischemia or gangrene noted b/l LE. No cyanosis or clubbing noted b/l LE.  Neurological Examination: Sensation grossly intact b/l with 10 gram monofilament. Vibratory sensation intact b/l. Pt has subjective symptoms of neuropathy.  Dermatological Examination: Significant improvement in plaque appearance/texture. Most of the heel plaques are loose enough and are removed witthout much effort.   Hyperpigmented plaques bilateral lower extremities extending from below knee to  both ankles .  No open wounds. No interdigital macerations.   Toenails 1-5 b/l thick, discolored, elongated with subungual debris and pain on dorsal palpation.   Musculoskeletal Examination: Muscle strength 3/5 to all lower extremity muscle groups bilaterally. Pes planus deformity noted bilateral LE. Utilizes wheelchair for mobility assistance.  Radiographs: None  Assessment/Plan: 1. Pain due to onychomycosis of toenails of both feet   2. Acquired keratoderma   3. Chronic venous stasis dermatitis   4. Diabetic peripheral neuropathy associated with type 2 diabetes mellitus (HCC)     Patient was evaluated and treated. All patient's and/or POA's questions/concerns addressed on today's visit. Toenails 1-5 debrided in length and girth without incident. Continue foot and shoe inspections daily. Monitor blood glucose per PCP/Endocrinologist's recommendations. Continue soft, supportive shoe gear daily. Report any pedal injuries to medical professional. Call office if there are any questions/concerns. -Encouraged patient on progress as his heels are much improved. Loose plaques gently removed. Feet cleansed with dial antibacterial soap and alcohol. Triple antibiotic ointment applied. Continue treatment plan per Dermatology. -Patient/POA to call should there be question/concern in the interim.   Return in about 3 months (around 04/13/2024).  Franklin Hicks, DPM      [facility] LOCATION: [address]                                                 [place], [place]  [address]                   Office [phone]   [facility] LOCATION: [address] [place], [place] [address] Office [phone]

## 2024-01-20 DIAGNOSIS — M51369 Other intervertebral disc degeneration, lumbar region without mention of lumbar back pain or lower extremity pain: Secondary | ICD-10-CM | POA: Diagnosis not present

## 2024-01-20 DIAGNOSIS — M4804 Spinal stenosis, thoracic region: Secondary | ICD-10-CM | POA: Diagnosis not present

## 2024-01-20 DIAGNOSIS — M4807 Spinal stenosis, lumbosacral region: Secondary | ICD-10-CM | POA: Diagnosis not present

## 2024-01-20 DIAGNOSIS — G8929 Other chronic pain: Secondary | ICD-10-CM | POA: Diagnosis not present

## 2024-01-20 DIAGNOSIS — M48061 Spinal stenosis, lumbar region without neurogenic claudication: Secondary | ICD-10-CM | POA: Diagnosis not present

## 2024-01-20 DIAGNOSIS — M47816 Spondylosis without myelopathy or radiculopathy, lumbar region: Secondary | ICD-10-CM | POA: Diagnosis not present

## 2024-01-20 DIAGNOSIS — M2469 Ankylosis, other specified joint: Secondary | ICD-10-CM | POA: Diagnosis not present

## 2024-01-20 DIAGNOSIS — I110 Hypertensive heart disease with heart failure: Secondary | ICD-10-CM | POA: Diagnosis not present

## 2024-01-20 DIAGNOSIS — I5042 Chronic combined systolic (congestive) and diastolic (congestive) heart failure: Secondary | ICD-10-CM | POA: Diagnosis not present

## 2024-01-21 ENCOUNTER — Telehealth: Payer: Self-pay

## 2024-01-21 DIAGNOSIS — M545 Low back pain, unspecified: Secondary | ICD-10-CM

## 2024-01-21 DIAGNOSIS — M48061 Spinal stenosis, lumbar region without neurogenic claudication: Secondary | ICD-10-CM | POA: Diagnosis not present

## 2024-01-21 DIAGNOSIS — M4804 Spinal stenosis, thoracic region: Secondary | ICD-10-CM | POA: Diagnosis not present

## 2024-01-21 DIAGNOSIS — M4807 Spinal stenosis, lumbosacral region: Secondary | ICD-10-CM | POA: Diagnosis not present

## 2024-01-21 DIAGNOSIS — I5042 Chronic combined systolic (congestive) and diastolic (congestive) heart failure: Secondary | ICD-10-CM | POA: Diagnosis not present

## 2024-01-21 DIAGNOSIS — M47816 Spondylosis without myelopathy or radiculopathy, lumbar region: Secondary | ICD-10-CM | POA: Diagnosis not present

## 2024-01-21 DIAGNOSIS — M51369 Other intervertebral disc degeneration, lumbar region without mention of lumbar back pain or lower extremity pain: Secondary | ICD-10-CM | POA: Diagnosis not present

## 2024-01-21 DIAGNOSIS — S14104S Unspecified injury at C4 level of cervical spinal cord, sequela: Secondary | ICD-10-CM

## 2024-01-21 DIAGNOSIS — M2469 Ankylosis, other specified joint: Secondary | ICD-10-CM | POA: Diagnosis not present

## 2024-01-21 DIAGNOSIS — G8929 Other chronic pain: Secondary | ICD-10-CM | POA: Diagnosis not present

## 2024-01-21 DIAGNOSIS — I110 Hypertensive heart disease with heart failure: Secondary | ICD-10-CM | POA: Diagnosis not present

## 2024-01-21 NOTE — Telephone Encounter (Signed)
 Copied from CRM [id number]. Topic: Clinical - Home Health Verbal Orders >> Jan 20, 2024  3:13 PM Juleen Oakland F wrote: Caller/Agency: Cortland Ding from [facility]  Callback Number: [phone] Service Requested: Physical Therapy Frequency: Patient needs out patient physical referral to [facility] - Fax Number:[phone]  Any new concerns about the patient? No

## 2024-01-22 ENCOUNTER — Telehealth: Payer: Self-pay | Admitting: Family Medicine

## 2024-01-22 DIAGNOSIS — G8929 Other chronic pain: Secondary | ICD-10-CM | POA: Diagnosis not present

## 2024-01-22 DIAGNOSIS — M47816 Spondylosis without myelopathy or radiculopathy, lumbar region: Secondary | ICD-10-CM | POA: Diagnosis not present

## 2024-01-22 DIAGNOSIS — M4807 Spinal stenosis, lumbosacral region: Secondary | ICD-10-CM | POA: Diagnosis not present

## 2024-01-22 DIAGNOSIS — M2469 Ankylosis, other specified joint: Secondary | ICD-10-CM | POA: Diagnosis not present

## 2024-01-22 DIAGNOSIS — M4804 Spinal stenosis, thoracic region: Secondary | ICD-10-CM | POA: Diagnosis not present

## 2024-01-22 DIAGNOSIS — M48061 Spinal stenosis, lumbar region without neurogenic claudication: Secondary | ICD-10-CM | POA: Diagnosis not present

## 2024-01-22 DIAGNOSIS — M51369 Other intervertebral disc degeneration, lumbar region without mention of lumbar back pain or lower extremity pain: Secondary | ICD-10-CM | POA: Diagnosis not present

## 2024-01-22 DIAGNOSIS — I110 Hypertensive heart disease with heart failure: Secondary | ICD-10-CM | POA: Diagnosis not present

## 2024-01-22 DIAGNOSIS — I5042 Chronic combined systolic (congestive) and diastolic (congestive) heart failure: Secondary | ICD-10-CM | POA: Diagnosis not present

## 2024-01-22 NOTE — Telephone Encounter (Unsigned)
 Copied from CRM [id number]. Topic: Referral - Status >> Jan 22, 2024  8:57 AM Kita Perish H wrote: Reason for CRM: [facility] calling to check status of referral for patient, patient called office to setup appointment to start his physical therapy.   [facility] [phone] close at [facility] today patient scheduled for Monday

## 2024-01-26 ENCOUNTER — Telehealth: Payer: Self-pay | Admitting: Family Medicine

## 2024-01-26 NOTE — Telephone Encounter (Unsigned)
 Copied from CRM [id number]. Topic: Referral - Question >> Jan 26, 2024  2:07 PM Alyse July wrote: Reason for CRM: [facility] would like to know if a referral can be faxed to their office for patient to have physical therapy. Fax number [phone].

## 2024-01-26 NOTE — Telephone Encounter (Signed)
 Yes thanks-you may refer directly to them

## 2024-01-27 ENCOUNTER — Encounter: Payer: Self-pay | Admitting: *Deleted

## 2024-01-27 NOTE — Telephone Encounter (Signed)
 This referral was placed on 05/29. I have followed up with Edwina Gram our referral coordinator to check into it.

## 2024-02-01 DIAGNOSIS — M4804 Spinal stenosis, thoracic region: Secondary | ICD-10-CM | POA: Diagnosis not present

## 2024-02-02 ENCOUNTER — Other Ambulatory Visit: Payer: Self-pay | Admitting: Sports Medicine

## 2024-02-03 DIAGNOSIS — M4804 Spinal stenosis, thoracic region: Secondary | ICD-10-CM | POA: Diagnosis not present

## 2024-02-08 DIAGNOSIS — M4804 Spinal stenosis, thoracic region: Secondary | ICD-10-CM | POA: Diagnosis not present

## 2024-02-11 DIAGNOSIS — M4804 Spinal stenosis, thoracic region: Secondary | ICD-10-CM | POA: Diagnosis not present

## 2024-02-15 DIAGNOSIS — M4804 Spinal stenosis, thoracic region: Secondary | ICD-10-CM | POA: Diagnosis not present

## 2024-02-18 ENCOUNTER — Encounter: Payer: Self-pay | Admitting: Dermatology

## 2024-02-18 ENCOUNTER — Ambulatory Visit: Admitting: Dermatology

## 2024-02-18 DIAGNOSIS — L859 Epidermal thickening, unspecified: Secondary | ICD-10-CM | POA: Diagnosis not present

## 2024-02-18 DIAGNOSIS — I872 Venous insufficiency (chronic) (peripheral): Secondary | ICD-10-CM

## 2024-02-18 DIAGNOSIS — I890 Lymphedema, not elsewhere classified: Secondary | ICD-10-CM | POA: Diagnosis not present

## 2024-02-18 DIAGNOSIS — Z79899 Other long term (current) drug therapy: Secondary | ICD-10-CM

## 2024-02-18 DIAGNOSIS — L819 Disorder of pigmentation, unspecified: Secondary | ICD-10-CM | POA: Diagnosis not present

## 2024-02-18 DIAGNOSIS — Z7189 Other specified counseling: Secondary | ICD-10-CM

## 2024-02-18 MED ORDER — VASCULERA PO TABS: ORAL_TABLET | ORAL | 5 refills | Status: AC

## 2024-02-18 MED ORDER — TACROLIMUS 0.1 % EX OINT: TOPICAL_OINTMENT | CUTANEOUS | 2 refills | Status: AC

## 2024-02-18 NOTE — Patient Instructions (Signed)

## 2024-02-18 NOTE — Progress Notes (Signed)
   Follow-Up Visit   Subjective  Franklin Hicks is a 73 y.o. male who presents for the following: Pt states that legs have been doing better since starting tacrolimus  0.1% ointment, urea 40%, and Vasculera diosmiplex 630 mg tablet take 1 tablet a day. Pt reports no s/e.  The following portions of the chart were reviewed this encounter and updated as appropriate: medications, allergies, medical history  Review of Systems:  No other skin or systemic complaints except as noted in HPI or Assessment and Plan.  Objective  Well appearing patient in no apparent distress; mood and affect are within normal limits.  A focused examination was performed of the following areas: the feet and lower legs  Relevant exam findings are noted in the Assessment and Plan.   Assessment & Plan       STASIS DERMATITIS OF BOTH LEGS  STASIS DERMATITIS with Hyperkeratosis and  Elephantiasis Nostra / Lymphedema With Edema and Dyschromia Moderate improvement today on current treatment of oral Vasculera and topical Tacrolimus ; topical Urea and Knee High Graduated Compression stockings Exam: Erythematous, scaly patches involving the ankle and distal lower leg with associated lower leg edema. Chronic and persistent condition with duration or expected duration over one year. Condition is improving with treatment but not currently at goal.  See photos Stasis in the legs causes chronic leg swelling, which may result in itchy or painful rashes, skin discoloration, skin texture changes, and sometimes ulceration.  Recommend daily graduated compression hose/stockings- easiest to put on first thing in morning, remove at bedtime.  Elevate legs as much as possible. Avoid salt/sodium rich foods.   Treatment Plan: Continue Compression stocking daily  Continue Urea 40% cream every day to debride scale Continue Tacrolimus  cream apply to legs daily to decrease inflammation. Continue Vasculera diosmiplex 630 mg tablet take 1  tablet a day - (4 sample packets given 30 day supply)   Advised the patient that his condition is not curable but treatable and we can improve it. He understands.  Previously referred to lymphedema clinic, but patient hasn't heard from their office. Today we will refer to vascular surgeon for evaluation, and if they feel it is needed they can refer patient again. Related Procedures Ambulatory referral to Vascular Surgery COUNSELING AND COORDINATION OF CARE   MEDICATION MANAGEMENT   ELEPHANTIASIS NOSTRA VERRUCOSA   DYSCHROMIA    Return for stasis dermatitis follow up in 8-9 mths.  LILLETTE Rosina Mayans, CMA, am acting as scribe for Alm Rhyme, MD .   Documentation: I have reviewed the above documentation for accuracy and completeness, and I agree with the above.  Alm Rhyme, MD

## 2024-02-22 DIAGNOSIS — M4804 Spinal stenosis, thoracic region: Secondary | ICD-10-CM | POA: Diagnosis not present

## 2024-02-24 DIAGNOSIS — M4804 Spinal stenosis, thoracic region: Secondary | ICD-10-CM | POA: Diagnosis not present

## 2024-03-08 DIAGNOSIS — Z6841 Body Mass Index (BMI) 40.0 and over, adult: Secondary | ICD-10-CM | POA: Diagnosis not present

## 2024-03-08 DIAGNOSIS — M47816 Spondylosis without myelopathy or radiculopathy, lumbar region: Secondary | ICD-10-CM | POA: Diagnosis not present

## 2024-03-09 DIAGNOSIS — M4804 Spinal stenosis, thoracic region: Secondary | ICD-10-CM | POA: Diagnosis not present

## 2024-03-14 DIAGNOSIS — M4804 Spinal stenosis, thoracic region: Secondary | ICD-10-CM | POA: Diagnosis not present

## 2024-03-16 DIAGNOSIS — M4804 Spinal stenosis, thoracic region: Secondary | ICD-10-CM | POA: Diagnosis not present

## 2024-03-21 DIAGNOSIS — M4804 Spinal stenosis, thoracic region: Secondary | ICD-10-CM | POA: Diagnosis not present

## 2024-03-22 ENCOUNTER — Other Ambulatory Visit: Payer: Self-pay | Admitting: Family Medicine

## 2024-03-23 DIAGNOSIS — M4804 Spinal stenosis, thoracic region: Secondary | ICD-10-CM | POA: Diagnosis not present

## 2024-03-24 ENCOUNTER — Other Ambulatory Visit: Payer: Self-pay | Admitting: Sports Medicine

## 2024-03-28 DIAGNOSIS — M4804 Spinal stenosis, thoracic region: Secondary | ICD-10-CM | POA: Diagnosis not present

## 2024-03-30 DIAGNOSIS — M4804 Spinal stenosis, thoracic region: Secondary | ICD-10-CM | POA: Diagnosis not present

## 2024-04-04 ENCOUNTER — Ambulatory Visit: Admitting: Family Medicine

## 2024-04-04 DIAGNOSIS — M4804 Spinal stenosis, thoracic region: Secondary | ICD-10-CM | POA: Diagnosis not present

## 2024-04-05 ENCOUNTER — Ambulatory Visit (INDEPENDENT_AMBULATORY_CARE_PROVIDER_SITE_OTHER): Admitting: Vascular Surgery

## 2024-04-05 ENCOUNTER — Encounter (INDEPENDENT_AMBULATORY_CARE_PROVIDER_SITE_OTHER): Payer: Self-pay | Admitting: Vascular Surgery

## 2024-04-05 VITALS — BP 141/82 | HR 74 | Ht 72.0 in | Wt 375.0 lb

## 2024-04-05 DIAGNOSIS — I10 Essential (primary) hypertension: Secondary | ICD-10-CM

## 2024-04-05 DIAGNOSIS — R600 Localized edema: Secondary | ICD-10-CM

## 2024-04-05 NOTE — Progress Notes (Signed)
 Subjective:    Patient ID: Franklin Hicks, male    DOB: [birth year], 73 y.o.   MRN: [id number] Chief Complaint  Patient presents with   New Patient (Initial Visit)    - np. consult. Evaluation of STASIS DERMATITIS with Hyperkeratosis and Elephantiasis Nostra with Edema and Dyschromia. Franklin Hicks.       Franklin Hicks is a 73 year old male who presents to clinic today as a new patient consult for evaluation of stasis dermatitis with hyperkeratosis with +3 edema to bilateral lower extremities.  Patient endorses he has been battling swollen legs for years.  He started seeing dermatology a year ago for the hyperkeratosis and stasis dermatitis.  He has been using tacrolimus  cream as well as cortisone creams to try to help assist.  He has not been doing any conventional therapy other than over-the-counter compression socks which have not relieved any of his edema.  The size of his legs with the edema is making it difficult for him to ambulate.  He does not walk much she states.  He states he sits most of the time.  He is also not elevating his legs to help with this edema as well.    Review of Systems  Constitutional: Negative.   Cardiovascular:  Positive for leg swelling.  Musculoskeletal:  Positive for gait problem.  Skin:  Positive for color change.  Neurological:  Positive for weakness.  All other systems reviewed and are negative.      Objective:   Physical Exam Constitutional:      Appearance: Normal appearance. He is obese.     Comments: Morbid obesity.  HENT:     Head: Normocephalic.  Eyes:     Pupils: Pupils are equal, round, and reactive to light.  Cardiovascular:     Rate and Rhythm: Normal rate and regular rhythm.     Pulses: Normal pulses.     Heart sounds: Normal heart sounds.  Pulmonary:     Effort: Pulmonary effort is normal.     Breath sounds: Normal breath sounds.  Abdominal:     General: Bowel sounds are normal.     Palpations: Abdomen is soft.   Musculoskeletal:        General: Swelling and tenderness present.     Right lower leg: Edema present.     Left lower leg: Edema present.  Skin:    General: Skin is warm.     Capillary Refill: Capillary refill takes 2 to 3 seconds.     Comments: Positive hyperkeratosis and stasis dermatitis of the skin of his bilateral lower extremities from mid calf to the top of his feet  Neurological:     General: No focal deficit present.     Mental Status: He is alert and oriented to person, place, and time. Mental status is at baseline.     Gait: Gait abnormal.     Comments: Patient with abnormal gait due to morbid obesity and bilateral lower extremity swelling  Psychiatric:        Mood and Affect: Mood normal.        Behavior: Behavior normal.        Thought Content: Thought content normal.        Judgment: Judgment normal.     BP (!) 141/82   Pulse 74   Ht 6' (1.829 m)   Wt (!) 375 lb (170.1 kg) Comment: pt reported  BMI 50.86 kg/m   Past Medical History:  Diagnosis Date   Arthritis  knees (12/17/2017)   CAD S/P percutaneous coronary angioplasty 09/20/2016   Nstemi 08/2016. Dr. Claudene. DES- brilinta  and asa. Requests change to [facility] cardiology; 95% Ramus -> PCI Resolute Onyx DES 2.75 x 18   Central cord syndrome (HCC) 12/17/2017   Chronic combined systolic and diastolic heart failure (HCC) 10/09/2016   EF 45% and grade II diastolic after nstemi   Diet-controlled diabetes mellitus (HCC)    DJD (degenerative joint disease)    Gout    High cholesterol    Hypertension    NSTEMI (non-ST elevated myocardial infarction) (HCC) 09/20/2016   95% Ramus - > PCI    Obesity    Recurrent pulmonary embolism (HCC) 11/'14; 4/'19   a) Bilateral segmental and subsegmental pulmonary emboli with mild RV Strain.; b) after fall with C-spine Fxr --> Acute PE of right main pulmonary artery extending into multiple segments.    Social History   Socioeconomic History   Marital status: Widowed     Spouse name: Not on file   Number of children: Not on file   Years of education: Not on file   Highest education level: Not on file  Occupational History   Occupation: teacher  Tobacco Use   Smoking status: Former    Current packs/day: 0.00    Average packs/day: 1 pack/day for 13.0 years (13.0 ttl pk-yrs)    Types: Cigarettes    Start date: 07/13/1964    Quit date: 07/13/1977    Years since quitting: 46.7   Smokeless tobacco: Never  Vaping Use   Vaping status: Never Used  Substance and Sexual Activity   Alcohol use: Yes    Alcohol/week: 0.0 standard drinks of alcohol    Comment: holidays only   Drug use: No   Sexual activity: Not on file  Other Topics Concern   Not on file  Social History Narrative   Widowed 02/2020 after over 40 years married.  He has 3 children and 5 grandchildren. They also 5 great grandchildren.       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   Social Drivers of Health   Financial Resource Strain: Low Risk  (09/16/2023)   Overall Financial Resource Strain (CARDIA)    Difficulty of Paying Living Expenses: Not hard at all  Food Insecurity: No Food Insecurity (12/15/2023)   Hunger Vital Sign    Worried About Running Out of Food in the Last Year: Never true    Ran Out of Food in the Last Year: Never true  Transportation Needs: No Transportation Needs (12/15/2023)   PRAPARE - Administrator, Civil Service (Medical): No    Lack of Transportation (Non-Medical): No  Physical Activity: Insufficiently Active (09/16/2023)   Exercise Vital Sign    Days of Exercise per Week: 2 days    Minutes of Exercise per Session: 10 min  Stress: No Stress Concern Present (09/16/2023)   [employer] of Occupational Health - Occupational Stress Questionnaire    Feeling of Stress : Not at all  Social Connections: Moderately Integrated (12/15/2023)   Social Connection and Isolation Panel    Frequency of Communication with Friends  and Family: Three times a week    Frequency of Social Gatherings with Friends and Family: Three times a week    Attends Religious Services: More than 4 times per year    Active Member of Clubs or Organizations: Yes    Attends Banker Meetings: 1 to 4 times per year  Marital Status: Widowed  Intimate Partner Violence: Not At Risk (12/15/2023)   Humiliation, Afraid, Rape, and Kick questionnaire    Fear of Current or Ex-Partner: No    Emotionally Abused: No    Physically Abused: No    Sexually Abused: No    Past Surgical History:  Procedure Laterality Date   CARDIAC CATHETERIZATION N/A 09/19/2016   Procedure: Left Heart Cath and Coronary Angiography;  Surgeon: Salena Negri, MD;  Location: [facility] INVASIVE CV LAB;  Service: Cardiovascular: 95% proximal Ramus Intermedius --> PCI   CARDIAC CATHETERIZATION N/A 09/19/2016   Procedure: Coronary Stent Intervention;  Surgeon: Lonni Hanson, MD;  Location: [facility] INVASIVE CV LAB;  Service: Cardiovascular: 95% ramus intermedius;  Resolute Onyx 2.75 x 18 mm drug-eluting stent   CYSTOSCOPY/RETROGRADE/URETEROSCOPY/STONE EXTRACTION WITH BASKET  7992,8000   ureteral stone    MENISCUS REPAIR Left    knee open meniscetomy   TRANSTHORACIC ECHOCARDIOGRAM  2004   no lvh nl ejection fraction   TRANSTHORACIC ECHOCARDIOGRAM  09/19/2016   In setting of NSTEMI:  EF 45-50% with diffuse hypokinesis. GR 2 DD. Mild biatrial enlargement.    Family History  Problem Relation Age of Onset   Liver disease Mother     Allergies  Allergen Reactions   Ace Inhibitors     Angioedema   Gabapentin      Dizzy on 300 mg dose    Other Hives and Itching    msg       Latest Ref Rng & Units 12/18/2023    3:21 AM 12/17/2023    4:00 AM 12/15/2023    3:00 AM  CBC  WBC 4.0 - 10.5 K/uL 6.6  6.8  6.3   Hemoglobin 13.0 - 17.0 g/dL 87.8  88.0  88.5   Hematocrit 39.0 - 52.0 % 38.9  37.8  36.2   Platelets 150 - 400 K/uL 222  213  208       CMP     Component Value  Date/Time   NA 134 (L) 12/18/2023 0321   K 4.0 12/18/2023 0321   CL 100 12/18/2023 0321   CO2 25 12/18/2023 0321   GLUCOSE 128 (H) 12/18/2023 0321   BUN 17 12/18/2023 0321   CREATININE 0.97 12/18/2023 0321   CREATININE 1.27 10/18/2021 1630   CALCIUM  8.2 (L) 12/18/2023 0321   PROT 7.6 12/01/2023 1528   ALBUMIN 3.7 12/01/2023 1528   AST 18 12/01/2023 1528   ALT 14 12/01/2023 1528   ALKPHOS 80 12/01/2023 1528   BILITOT 0.6 12/01/2023 1528   GFR 66.17 12/01/2023 1528   GFRNONAA >60 12/18/2023 0321     No results found.     Assessment & Plan:   1. Bilateral lower extremity edema (Primary) Patient presents to clinic today with venous stasis of both lower extremities.  Patient had been seen by dermatology for his stasis dermatitis and hyperkeratosis with his edema but they now recommend he see vascular surgery.  Patient is noted to have +3 to +4 edema to bilateral lower extremities.  Hyperkeratosis surrounds both of his lower extremities from the tops of his feet to mid calf.  He does not have any open sores.  Patient is morbidly obese and at this time walks very little.  He does complain of pain to his bilateral lower extremities due to the extreme swelling.  Today he is wearing compression socks but they are not doing much.  Patient will be placed in Unna boot wraps to be changed weekly.  Patient will return to  our clinic this time next week so I can evaluate his lower extremities after the first wrap.  Patient then will be sent out to the community with home health to follow to do Unna boot wraps once a week for the next 6 weeks.  Patient will then follow-up with me in clinic in 6 weeks.  2. Essential hypertension Continue antihypertensive medications as already ordered, these medications have been reviewed and there are no changes at this time.  3. Morbid obesity (HCC) I had a long detailed with the patient concerning his obesity and how it relates to his bilateral lower extremity  leg swelling. I encouraged him to work on losing weight to take pressure and swelling off his legs. It will also help with better control of blood pressure.    Current Outpatient Medications on File Prior to Visit  Medication Sig Dispense Refill   acetaminophen  (TYLENOL ) 650 MG CR tablet Take 650-1,300 mg by mouth every 8 (eight) hours as needed for pain.     ammonium lactate  (AMLACTIN) 12 % lotion Apply 1 application topically as needed for dry skin. 400 g 0   aspirin  EC 81 MG tablet Take 1 tablet (81 mg total) by mouth daily. (Patient taking differently: Take 81 mg by mouth in the morning.) 90 tablet 3   atorvastatin  (LIPITOR ) 80 MG tablet TAKE 1 TABLET BY MOUTH DAILY AT 6 PM. 90 tablet 3   Chromium  Picolinate (CHROMIUM  PICOLATE PO) Take 1 tablet by mouth 3 (three) times a week.     clotrimazole -betamethasone  (LOTRISONE ) cream Apply to both lower extremities twice daily. 45 g 3   diclofenac  sodium (VOLTAREN ) 1 % GEL Apply 1 application topically 3 (three) times daily. Both knees 3 Tube 4   Dietary Management Product (VASCULERA) TABS Take 1 tablet once a day 30 tablet 5   docusate sodium  (COLACE) 100 MG capsule Take 1 capsule (100 mg total) by mouth 2 (two) times daily. 60 capsule 5   fluticasone  (FLONASE ) 50 MCG/ACT nasal spray SPRAY 2 SPRAYS INTO EACH NOSTRIL EVERY DAY 48 mL 1   gabapentin  (NEURONTIN ) 100 MG capsule TAKE 1 CAPSULE (100 MG TOTAL) BY MOUTH THREE TIMES DAILY. 90 capsule 1   methocarbamol  (ROBAXIN ) 500 MG tablet TAKE 1 TABLET BY MOUTH EVERY 8 HOURS AS NEEDED FOR MUSCLE SPASMS. 60 tablet 2   metoprolol  tartrate (LOPRESSOR ) 50 MG tablet Take 1 tablet (50 mg total) by mouth 2 (two) times daily. 180 tablet 3   Multiple Vitamin (MULTIVITAMIN WITH MINERALS) TABS tablet Take 1 tablet by mouth daily.     oxyCODONE  (OXY IR/ROXICODONE ) 5 MG immediate release tablet Take 1 tablet (5 mg total) by mouth every 6 (six) hours as needed for moderate pain (pain score 4-6) or breakthrough pain. 30  tablet 0   polyethylene glycol (MIRALAX  / GLYCOLAX ) 17 g packet Take 17 g by mouth daily as needed for mild constipation. 14 each 0   rivaroxaban  (XARELTO ) 20 MG TABS tablet Take 1 tablet (20 mg total) by mouth daily. 30 tablet 11   tacrolimus  (PROTOPIC ) 0.1 % ointment Apply to legs twice a day 100 g 2   terazosin  (HYTRIN ) 10 MG capsule TAKE 1 CAPSULE BY MOUTH EVERYDAY AT BEDTIME 90 capsule 3   vitamin C (ASCORBIC ACID) 500 MG tablet Take 500 mg by mouth daily in the afternoon.     No current facility-administered medications on file prior to visit.    There are no Patient Instructions on file for this visit. No follow-ups  on file.   Gwendlyn JONELLE Shank, NP

## 2024-04-06 DIAGNOSIS — M4804 Spinal stenosis, thoracic region: Secondary | ICD-10-CM | POA: Diagnosis not present

## 2024-04-11 DIAGNOSIS — M4804 Spinal stenosis, thoracic region: Secondary | ICD-10-CM | POA: Diagnosis not present

## 2024-04-12 ENCOUNTER — Ambulatory Visit (INDEPENDENT_AMBULATORY_CARE_PROVIDER_SITE_OTHER): Admitting: Nurse Practitioner

## 2024-04-12 ENCOUNTER — Encounter (INDEPENDENT_AMBULATORY_CARE_PROVIDER_SITE_OTHER): Payer: Self-pay

## 2024-04-12 ENCOUNTER — Telehealth (INDEPENDENT_AMBULATORY_CARE_PROVIDER_SITE_OTHER): Payer: Self-pay

## 2024-04-12 VITALS — BP 152/61 | HR 69 | Resp 18

## 2024-04-12 DIAGNOSIS — R600 Localized edema: Secondary | ICD-10-CM

## 2024-04-12 NOTE — Addendum Note (Signed)
 Addended by: ELISABETH ROUND on: 04/12/2024 04:48 PM   Modules accepted: Orders

## 2024-04-12 NOTE — Progress Notes (Signed)
 History of Present Illness  There is no documented history at this time  Assessments & Plan   There are no diagnoses linked to this encounter.    Additional instructions  Subjective:  Patient presents with venous ulcer of the Bilateral lower extremity.    Procedure:  3 layer unna wrap was placed Bilateral lower extremity.   Plan:   Follow up in one week.

## 2024-04-12 NOTE — Telephone Encounter (Signed)
 Home health orders for weekly bilateral unna wraps has been sent to [facility] home health. Patient has been notified that start of care will start next week.

## 2024-04-13 DIAGNOSIS — M4804 Spinal stenosis, thoracic region: Secondary | ICD-10-CM | POA: Diagnosis not present

## 2024-04-14 NOTE — Telephone Encounter (Signed)
 Unfortunately home health will not be able to start skilled nursing for weekly unna wraps. The patient will continue to come in the office for weekly unna wraps. Patient has been notified with information.

## 2024-04-15 NOTE — Telephone Encounter (Signed)
 LVM for pt at phone numbers listed to CB to to schedule appts at [facility]. unna x4 until his 05/17/24 BP appt

## 2024-04-18 DIAGNOSIS — M4804 Spinal stenosis, thoracic region: Secondary | ICD-10-CM | POA: Diagnosis not present

## 2024-04-20 ENCOUNTER — Telehealth (INDEPENDENT_AMBULATORY_CARE_PROVIDER_SITE_OTHER): Payer: Self-pay

## 2024-04-20 ENCOUNTER — Ambulatory Visit: Admitting: Podiatry

## 2024-04-20 DIAGNOSIS — M4804 Spinal stenosis, thoracic region: Secondary | ICD-10-CM | POA: Diagnosis not present

## 2024-04-20 NOTE — Telephone Encounter (Signed)
 Patient has been notified that [facility] home [facility] will be able start skilled nursing for weekly bilateral unna wraps.

## 2024-04-21 ENCOUNTER — Encounter (INDEPENDENT_AMBULATORY_CARE_PROVIDER_SITE_OTHER)

## 2024-04-21 DIAGNOSIS — G8250 Quadriplegia, unspecified: Secondary | ICD-10-CM | POA: Diagnosis not present

## 2024-04-21 DIAGNOSIS — E1169 Type 2 diabetes mellitus with other specified complication: Secondary | ICD-10-CM | POA: Diagnosis not present

## 2024-04-21 DIAGNOSIS — I5042 Chronic combined systolic (congestive) and diastolic (congestive) heart failure: Secondary | ICD-10-CM | POA: Diagnosis not present

## 2024-04-21 DIAGNOSIS — I130 Hypertensive heart and chronic kidney disease with heart failure and stage 1 through stage 4 chronic kidney disease, or unspecified chronic kidney disease: Secondary | ICD-10-CM | POA: Diagnosis not present

## 2024-04-21 DIAGNOSIS — Z6841 Body Mass Index (BMI) 40.0 and over, adult: Secondary | ICD-10-CM | POA: Diagnosis not present

## 2024-04-21 DIAGNOSIS — N1831 Chronic kidney disease, stage 3a: Secondary | ICD-10-CM | POA: Diagnosis not present

## 2024-04-21 DIAGNOSIS — E1122 Type 2 diabetes mellitus with diabetic chronic kidney disease: Secondary | ICD-10-CM | POA: Diagnosis not present

## 2024-04-21 DIAGNOSIS — E1151 Type 2 diabetes mellitus with diabetic peripheral angiopathy without gangrene: Secondary | ICD-10-CM | POA: Diagnosis not present

## 2024-04-22 ENCOUNTER — Encounter (INDEPENDENT_AMBULATORY_CARE_PROVIDER_SITE_OTHER): Payer: Self-pay | Admitting: Nurse Practitioner

## 2024-04-22 ENCOUNTER — Ambulatory Visit (INDEPENDENT_AMBULATORY_CARE_PROVIDER_SITE_OTHER): Admitting: Nurse Practitioner

## 2024-04-22 VITALS — BP 149/71 | HR 64 | Resp 18

## 2024-04-22 DIAGNOSIS — R600 Localized edema: Secondary | ICD-10-CM

## 2024-04-22 NOTE — Progress Notes (Signed)
 History of Present Illness  There is no documented history at this time  Assessments & Plan   There are no diagnoses linked to this encounter.    Additional instructions  Subjective:  Patient presents with venous ulcer of the Bilateral lower extremity.    Procedure:  3 layer unna wrap was placed Bilateral lower extremity.   Plan:   Follow up in one week.

## 2024-04-25 ENCOUNTER — Encounter (INDEPENDENT_AMBULATORY_CARE_PROVIDER_SITE_OTHER): Payer: Self-pay | Admitting: Nurse Practitioner

## 2024-04-28 ENCOUNTER — Encounter (INDEPENDENT_AMBULATORY_CARE_PROVIDER_SITE_OTHER)

## 2024-04-28 DIAGNOSIS — M4804 Spinal stenosis, thoracic region: Secondary | ICD-10-CM | POA: Diagnosis not present

## 2024-04-29 DIAGNOSIS — N1831 Chronic kidney disease, stage 3a: Secondary | ICD-10-CM | POA: Diagnosis not present

## 2024-04-29 DIAGNOSIS — I5042 Chronic combined systolic (congestive) and diastolic (congestive) heart failure: Secondary | ICD-10-CM | POA: Diagnosis not present

## 2024-04-29 DIAGNOSIS — Z6841 Body Mass Index (BMI) 40.0 and over, adult: Secondary | ICD-10-CM | POA: Diagnosis not present

## 2024-04-29 DIAGNOSIS — I130 Hypertensive heart and chronic kidney disease with heart failure and stage 1 through stage 4 chronic kidney disease, or unspecified chronic kidney disease: Secondary | ICD-10-CM | POA: Diagnosis not present

## 2024-04-29 DIAGNOSIS — E1169 Type 2 diabetes mellitus with other specified complication: Secondary | ICD-10-CM | POA: Diagnosis not present

## 2024-04-29 DIAGNOSIS — G8250 Quadriplegia, unspecified: Secondary | ICD-10-CM | POA: Diagnosis not present

## 2024-04-29 DIAGNOSIS — E1151 Type 2 diabetes mellitus with diabetic peripheral angiopathy without gangrene: Secondary | ICD-10-CM | POA: Diagnosis not present

## 2024-04-29 DIAGNOSIS — E1122 Type 2 diabetes mellitus with diabetic chronic kidney disease: Secondary | ICD-10-CM | POA: Diagnosis not present

## 2024-05-02 DIAGNOSIS — M4804 Spinal stenosis, thoracic region: Secondary | ICD-10-CM | POA: Diagnosis not present

## 2024-05-03 ENCOUNTER — Telehealth (INDEPENDENT_AMBULATORY_CARE_PROVIDER_SITE_OTHER): Payer: Self-pay | Admitting: Vascular Surgery

## 2024-05-03 NOTE — Telephone Encounter (Signed)
 Please contact patient to scheduled weekly unna wraps

## 2024-05-03 NOTE — Telephone Encounter (Signed)
 Nurse left a message stating that unable help patient because there is no open wound. All they could do is help advise about compression stocking.  Home Health [phone]

## 2024-05-04 ENCOUNTER — Ambulatory Visit (INDEPENDENT_AMBULATORY_CARE_PROVIDER_SITE_OTHER): Admitting: Nurse Practitioner

## 2024-05-04 ENCOUNTER — Encounter (INDEPENDENT_AMBULATORY_CARE_PROVIDER_SITE_OTHER): Payer: Self-pay

## 2024-05-04 VITALS — BP 127/74 | HR 72 | Resp 18

## 2024-05-04 DIAGNOSIS — R600 Localized edema: Secondary | ICD-10-CM | POA: Diagnosis not present

## 2024-05-04 DIAGNOSIS — M4804 Spinal stenosis, thoracic region: Secondary | ICD-10-CM | POA: Diagnosis not present

## 2024-05-04 NOTE — Progress Notes (Unsigned)
 History of Present Illness  There is no documented history at this time  Assessments & Plan   There are no diagnoses linked to this encounter.    Additional instructions  Subjective:  Patient presents with venous ulcer of the Bilateral lower extremity.    Procedure:  3 layer unna wrap was placed Bilateral lower extremity.   Plan:   Follow up in one week.

## 2024-05-05 ENCOUNTER — Encounter (INDEPENDENT_AMBULATORY_CARE_PROVIDER_SITE_OTHER)

## 2024-05-06 ENCOUNTER — Encounter (INDEPENDENT_AMBULATORY_CARE_PROVIDER_SITE_OTHER)

## 2024-05-06 ENCOUNTER — Encounter: Payer: Self-pay | Admitting: Family Medicine

## 2024-05-06 ENCOUNTER — Ambulatory Visit: Admitting: Family Medicine

## 2024-05-06 VITALS — BP 132/60 | HR 63 | Ht 72.0 in

## 2024-05-06 DIAGNOSIS — Z23 Encounter for immunization: Secondary | ICD-10-CM

## 2024-05-06 DIAGNOSIS — M4804 Spinal stenosis, thoracic region: Secondary | ICD-10-CM

## 2024-05-06 DIAGNOSIS — E119 Type 2 diabetes mellitus without complications: Secondary | ICD-10-CM

## 2024-05-06 DIAGNOSIS — E010 Iodine-deficiency related diffuse (endemic) goiter: Secondary | ICD-10-CM | POA: Diagnosis not present

## 2024-05-06 DIAGNOSIS — G8250 Quadriplegia, unspecified: Secondary | ICD-10-CM | POA: Diagnosis not present

## 2024-05-06 MED ORDER — TIRZEPATIDE 2.5 MG/0.5ML ~~LOC~~ SOAJ: 2.5000 mg | SUBCUTANEOUS | 1 refills | Status: DC

## 2024-05-06 MED ORDER — METHOCARBAMOL 500 MG PO TABS: 500.0000 mg | ORAL_TABLET | Freq: Three times a day (TID) | ORAL | 2 refills | Status: DC | PRN

## 2024-05-06 NOTE — Progress Notes (Signed)
 Phone [phone] In person visit   Subjective:   Franklin Hicks is a 73 y.o. year old very pleasant male patient who presents for/with See problem oriented charting Chief Complaint  Patient presents with   Thoracic spinal stenosis   Past Medical History-  Patient Active Problem List   Diagnosis Date Noted   Recurrent pulmonary embolism (HCC)     Priority: High   C4 spinal cord injury, sequela (HCC) 12/18/2017    Priority: High   Tetraplegia (HCC) 12/18/2017    Priority: High   Aortic aneurysm (HCC) 12/16/2017    Priority: High   Cord compression (HCC) 12/13/2017    Priority: High   Chronic combined systolic and diastolic heart failure (HCC) 10/09/2016    Priority: High   CAD S/P percutaneous coronary angioplasty 09/20/2016    Priority: High   Morbid obesity (HCC) 07/18/2013    Priority: High   Diabetes mellitus type 2, controlled (HCC) 07/17/2010    Priority: High   Bilateral lower extremity edema 12/08/2008    Priority: High   Chronic kidney disease, stage 3a (HCC) 07/17/2023    Priority: Medium    Angioedema of lips 07/13/2013    Priority: Medium    Hyperlipidemia associated with type 2 diabetes mellitus (HCC) 10/02/2011    Priority: Medium    BPH with urinary obstruction 07/17/2010    Priority: Medium    Gout 03/15/2007    Priority: Medium    Essential hypertension 03/15/2007    Priority: Medium    Pain of left heel     Priority: Low   Primary osteoarthritis of knees, bilateral 01/11/2016    Priority: Low   Erectile dysfunction 07/30/2015    Priority: Low   Multinodular goiter 07/13/2013    Priority: Low   SOB (shortness of breath) 10/02/2011    Priority: Low   NEUROPATHY, IDIOPATHIC PERIPHERAL NEC 05/31/2007    Priority: Low   Osteoarthritis 03/22/2007    Priority: Low   Osteomyelitis of thoracic vertebra (HCC) 12/15/2023   Cholelithiases 08/09/2023   Fatty liver 08/09/2023   Peripheral arterial disease (HCC) 08/09/2023   Prostatic enlargement  08/09/2023   DDD (degenerative disc disease), lumbar 08/09/2023   Preoperative cardiovascular examination 02/05/2021   Edema 12/08/2008   Low back pain without sciatica 11/10/2007    Medications- reviewed and updated Current Outpatient Medications  Medication Sig Dispense Refill   acetaminophen  (TYLENOL ) 650 MG CR tablet Take 650-1,300 mg by mouth every 8 (eight) hours as needed for pain.     ammonium lactate  (AMLACTIN) 12 % lotion Apply 1 application topically as needed for dry skin. 400 g 0   aspirin  EC 81 MG tablet Take 1 tablet (81 mg total) by mouth daily. 90 tablet 3   atorvastatin  (LIPITOR ) 80 MG tablet TAKE 1 TABLET BY MOUTH DAILY AT 6 PM. 90 tablet 3   Chromium  Picolinate (CHROMIUM  PICOLATE PO) Take 1 tablet by mouth 3 (three) times a week.     clotrimazole -betamethasone  (LOTRISONE ) cream Apply to both lower extremities twice daily. 45 g 3   diclofenac  sodium (VOLTAREN ) 1 % GEL Apply 1 application topically 3 (three) times daily. Both knees 3 Tube 4   Dietary Management Product (VASCULERA) TABS Take 1 tablet once a day 30 tablet 5   docusate sodium  (COLACE) 100 MG capsule Take 1 capsule (100 mg total) by mouth 2 (two) times daily. 60 capsule 5   gabapentin  (NEURONTIN ) 100 MG capsule TAKE 1 CAPSULE (100 MG TOTAL) BY MOUTH THREE TIMES DAILY.  90 capsule 1   metoprolol  tartrate (LOPRESSOR ) 50 MG tablet Take 1 tablet (50 mg total) by mouth 2 (two) times daily. 180 tablet 3   Multiple Vitamin (MULTIVITAMIN WITH MINERALS) TABS tablet Take 1 tablet by mouth daily.     oxyCODONE  (OXY IR/ROXICODONE ) 5 MG immediate release tablet Take 1 tablet (5 mg total) by mouth every 6 (six) hours as needed for moderate pain (pain score 4-6) or breakthrough pain. 30 tablet 0   polyethylene glycol (MIRALAX  / GLYCOLAX ) 17 g packet Take 17 g by mouth daily as needed for mild constipation. 14 each 0   rivaroxaban  (XARELTO ) 20 MG TABS tablet Take 1 tablet (20 mg total) by mouth daily. 30 tablet 11   tacrolimus   (PROTOPIC ) 0.1 % ointment Apply to legs twice a day 100 g 2   terazosin  (HYTRIN ) 10 MG capsule TAKE 1 CAPSULE BY MOUTH EVERYDAY AT BEDTIME 90 capsule 3   tirzepatide  (MOUNJARO ) 2.5 MG/0.5ML Pen Inject 2.5 mg into the skin once a week. 2 mL 1   vitamin C (ASCORBIC ACID) 500 MG tablet Take 500 mg by mouth daily in the afternoon.     fluticasone  (FLONASE ) 50 MCG/ACT nasal spray SPRAY 2 SPRAYS INTO EACH NOSTRIL EVERY DAY (Patient not taking: Reported on 05/06/2024) 48 mL 1   methocarbamol  (ROBAXIN ) 500 MG tablet Take 1 tablet (500 mg total) by mouth every 8 (eight) hours as needed for muscle spasms. 60 tablet 2   No current facility-administered medications for this visit.     Objective:  BP 132/60 (BP Location: Left Arm, Patient Position: Sitting, Cuff Size: Normal)   Pulse 63   Ht 6' (1.829 m)   SpO2 95%   BMI 50.86 kg/m  Gen: NAD, resting comfortably CV: RRR no murmurs rubs or gallops Lungs: CTAB no crackles, wheeze, rhonchi Ext: no edema Skin: warm, dry Neuro: uses wheelchair    Assessment and Plan   % Cord compression/tetraplegia/sequela of C4 spinal cord injury- patient with severe injury April 2019- has used wheelchair since that time.  Is able to walk with his walker as well. Follows with Dr. Arlana now as needed- referring back as didn't feel he could tolerate updated MRI laying down  # Thoracic spinal stenosis-significant T11-T12 with bilateral foraminal impingement at T7-T8 and T11-T12.  He had been seen in the hospital but they recommended outpatient neurosurgical consultation - which he did in july -No obvious targets for surgery to be beneficial so only planning on as needed follow-up and refer to pain management -still working with physical therapy . Finds spasms still bothering him but improved with physical therapy -gabapentin  100 mg three times daily helpful as well as methocarbamol - refill for him today -refer back to Dr. Babs who saw him in past for tetraplegia to  see if he can offer us  further assistance -did have one fall when backing up on scooter and scooter going off sidewalk. Worsened the spasms but gradually improving  # Recurrent pulmonary embolism  S: Prior DVT 2014 after traveling- believe led to PE. Recurrent PE 11/2017 after fall. Now on lifelong anticoagulation-Xarelto  20 mg A/P:  stable with ongoing therapy- continue xarelto  20 mg but also on aspirin  . #Chronic systolic and diastolic heart failure/hypertension S: Compliant lasix  now  as needed- rare , metoprolol  50mg  BID, terazosin  10mg  (also helps with BPH). Takes potassium through urology For CHF-  Reports no worsening swelling or weight gain  BP Readings from Last 3 Encounters:  05/06/24 132/60  05/04/24 127/74  04/22/24 (!) 149/71  A/P: CHF appears stable. Leg wraps for venous insufficiency skin changes have been helpful.  Hypertension stable- continue current medicines   #CAD/hyperlipidemia/aortic aneurysm. S: CAD followed by Dr. Anner with history of NSTEMI.  Compliant with aspirin .  Also on Xarelto  for recurrent PE.     Mild poorly controlled on atorvastatin  80mg  daily in the past-but better most recently -no chest pain or shortness of breath  Lab Results  Component Value Date   CHOL 101 12/01/2023   HDL 37.80 (L) 12/01/2023   LDLCALC 50 12/01/2023   LDLDIRECT 72.0 10/09/2016   TRIG 64.0 12/01/2023   CHOLHDL 3 12/01/2023   A/P: coronary artery disease  asymptomatic continue current medications Lipids at goal- continue current medications   # Diabetes/morbid obesity.  S: medication(s): prior has wanted to work on diet and exercise control-in 2024  into 2025 has jardiance  10 mg prescribed but not taking. Feels like has reached a plateau after prior down 16 lbs on home scales Lab Results  Component Value Date   HGBA1C 7.0 (H) 12/01/2023   HGBA1C 7.4 (H) 07/30/2023   HGBA1C 7.5 (H) 02/02/2023   A/P: diabetes has been reasonably controlled but we want benefits of lower  a1c as well as weight loss- trial Mounjaro  2.5 mg and titrate up to 5 mg after 1-2 months if not losing weight on base dose   #Cologuard- advised again- never had colon cancer screening- wants to chat again next visit- declines today  Recommended follow up: Return in about 4 months (around 09/05/2024) for followup or sooner if needed.Schedule b4 you leave. Future Appointments  Date Time Provider Department Center  05/13/2024  2:30 PM [facility] None  05/17/2024  2:30 PM Elisabeth Gwendlyn SAUNDERS, NP [facility] None  06/02/2024  4:15 PM Gaynel Delon CROME, DPM [facility]  09/20/2024  1:40 PM [facility] ANNUAL WELLNESS VISIT 1 [facility]  11/23/2024  2:45 PM Hester Alm BROCKS, MD [facility] None    Lab/Order associations:   ICD-10-CM   1. Need for vaccination for H flu type B  Z23     2. Need for influenza vaccination  Z23 Flu vaccine HIGH DOSE PF(Fluzone Trivalent)    3. Thoracic spinal stenosis  M48.04 Ambulatory referral to Physical Medicine Rehab    4. Tetraplegia (HCC)  G82.50 Ambulatory referral to Physical Medicine Rehab    5. Controlled type 2 diabetes mellitus without complication, without long-term current use of insulin  (HCC)  E11.9 Comprehensive metabolic panel with GFR    CBC with Differential/Platelet    Hemoglobin A1c      Meds ordered this encounter  Medications   methocarbamol  (ROBAXIN ) 500 MG tablet    Sig: Take 1 tablet (500 mg total) by mouth every 8 (eight) hours as needed for muscle spasms.    Dispense:  60 tablet    Refill:  2   tirzepatide  (MOUNJARO ) 2.5 MG/0.5ML Pen    Sig: Inject 2.5 mg into the skin once a week.    Dispense:  2 mL    Refill:  1    Return precautions advised.  Garnette Lukes, MD

## 2024-05-06 NOTE — Patient Instructions (Addendum)
 diabetes has been reasonably controlled but we want benefits of lower a1c as well as weight loss- trial Mounjaro  2.5 mg and titrate up to 5 mg after 1-2 months if not losing weight on base dose   Please stop by lab before you go If you have mychart- we will send your results within 3 business days of us  receiving them.  If you do not have mychart- we will call you about results within 5 business days of us  receiving them.  *please also note that you will see labs on mychart as soon as they post. I will later go in and write notes on them- will say notes from Dr. Katrinka   Recommended follow up: Return in about 4 months (around 09/05/2024) for followup or sooner if needed.Schedule b4 you leave.

## 2024-05-07 LAB — CBC WITH DIFFERENTIAL/PLATELET
Absolute Lymphocytes: 1465 {cells}/uL (ref 850–3900)
Absolute Monocytes: 599 {cells}/uL (ref 200–950)
Basophils Absolute: 22 {cells}/uL (ref 0–200)
Basophils Relative: 0.3 %
Eosinophils Absolute: 96 {cells}/uL (ref 15–500)
Eosinophils Relative: 1.3 %
HCT: 33.9 % — ABNORMAL LOW (ref 38.5–50.0)
Hemoglobin: 11 g/dL — ABNORMAL LOW (ref 13.2–17.1)
MCH: 30.6 pg (ref 27.0–33.0)
MCHC: 32.4 g/dL (ref 32.0–36.0)
MCV: 94.4 fL (ref 80.0–100.0)
MPV: 8.9 fL (ref 7.5–12.5)
Monocytes Relative: 8.1 %
Neutro Abs: 5217 {cells}/uL (ref 1500–7800)
Neutrophils Relative %: 70.5 %
Platelets: 278 Thousand/uL (ref 140–400)
RBC: 3.59 Million/uL — ABNORMAL LOW (ref 4.20–5.80)
RDW: 13.1 % (ref 11.0–15.0)
Total Lymphocyte: 19.8 %
WBC: 7.4 Thousand/uL (ref 3.8–10.8)

## 2024-05-07 LAB — COMPREHENSIVE METABOLIC PANEL WITH GFR
AG Ratio: 1.1 (calc) (ref 1.0–2.5)
ALT: 19 U/L (ref 9–46)
AST: 20 U/L (ref 10–35)
Albumin: 3.5 g/dL — ABNORMAL LOW (ref 3.6–5.1)
Alkaline phosphatase (APISO): 74 U/L (ref 35–144)
BUN: 19 mg/dL (ref 7–25)
CO2: 25 mmol/L (ref 20–32)
Calcium: 8.6 mg/dL (ref 8.6–10.3)
Chloride: 104 mmol/L (ref 98–110)
Creat: 1.12 mg/dL (ref 0.70–1.28)
Globulin: 3.2 g/dL (ref 1.9–3.7)
Glucose, Bld: 131 mg/dL — ABNORMAL HIGH (ref 65–99)
Potassium: 4.5 mmol/L (ref 3.5–5.3)
Sodium: 137 mmol/L (ref 135–146)
Total Bilirubin: 1 mg/dL (ref 0.2–1.2)
Total Protein: 6.7 g/dL (ref 6.1–8.1)
eGFR: 69 mL/min/1.73m2 (ref >=60)

## 2024-05-07 LAB — HEMOGLOBIN A1C
Hgb A1c MFr Bld: 6.7 % — ABNORMAL HIGH (ref <5.7)
Mean Plasma Glucose: 146 mg/dL
eAG (mmol/L): 8.1 mmol/L

## 2024-05-08 ENCOUNTER — Encounter (INDEPENDENT_AMBULATORY_CARE_PROVIDER_SITE_OTHER): Payer: Self-pay | Admitting: Nurse Practitioner

## 2024-05-09 ENCOUNTER — Ambulatory Visit: Payer: Self-pay | Admitting: Family Medicine

## 2024-05-09 DIAGNOSIS — M4804 Spinal stenosis, thoracic region: Secondary | ICD-10-CM | POA: Diagnosis not present

## 2024-05-11 DIAGNOSIS — M4804 Spinal stenosis, thoracic region: Secondary | ICD-10-CM | POA: Diagnosis not present

## 2024-05-12 ENCOUNTER — Encounter (INDEPENDENT_AMBULATORY_CARE_PROVIDER_SITE_OTHER)

## 2024-05-13 ENCOUNTER — Ambulatory Visit (INDEPENDENT_AMBULATORY_CARE_PROVIDER_SITE_OTHER): Admitting: Nurse Practitioner

## 2024-05-13 ENCOUNTER — Encounter (INDEPENDENT_AMBULATORY_CARE_PROVIDER_SITE_OTHER): Payer: Self-pay | Admitting: Nurse Practitioner

## 2024-05-13 DIAGNOSIS — R600 Localized edema: Secondary | ICD-10-CM | POA: Diagnosis not present

## 2024-05-13 NOTE — Progress Notes (Signed)
 History of Present Illness  There is no documented history at this time  Assessments & Plan   There are no diagnoses linked to this encounter.    Additional instructions  Subjective:  Patient presents with venous ulcer of the Bilateral lower extremity.    Procedure:  3 layer unna wrap was placed Bilateral lower extremity.   Plan:   Follow up in one week.

## 2024-05-16 DIAGNOSIS — M4804 Spinal stenosis, thoracic region: Secondary | ICD-10-CM | POA: Diagnosis not present

## 2024-05-17 ENCOUNTER — Ambulatory Visit (INDEPENDENT_AMBULATORY_CARE_PROVIDER_SITE_OTHER): Admitting: Vascular Surgery

## 2024-05-17 ENCOUNTER — Encounter (INDEPENDENT_AMBULATORY_CARE_PROVIDER_SITE_OTHER): Payer: Self-pay | Admitting: Vascular Surgery

## 2024-05-17 VITALS — BP 123/71 | HR 71 | Resp 18

## 2024-05-17 DIAGNOSIS — I10 Essential (primary) hypertension: Secondary | ICD-10-CM | POA: Diagnosis not present

## 2024-05-17 DIAGNOSIS — R600 Localized edema: Secondary | ICD-10-CM | POA: Diagnosis not present

## 2024-05-18 DIAGNOSIS — M4804 Spinal stenosis, thoracic region: Secondary | ICD-10-CM | POA: Diagnosis not present

## 2024-05-20 ENCOUNTER — Encounter: Payer: Self-pay | Admitting: Physical Medicine & Rehabilitation

## 2024-05-23 ENCOUNTER — Encounter (INDEPENDENT_AMBULATORY_CARE_PROVIDER_SITE_OTHER): Payer: Self-pay | Admitting: Vascular Surgery

## 2024-05-23 ENCOUNTER — Encounter: Payer: Self-pay | Admitting: Physical Medicine & Rehabilitation

## 2024-05-23 NOTE — Progress Notes (Unsigned)
 Subjective:    Patient ID: Franklin Hicks, male    DOB: 06-19-1951, 73 y.o.   MRN: [id number] Chief Complaint  Patient presents with   Follow-up    unna    Franklin Hicks is a 73 yo male who presents to clinic today in follow up after wearing Unna Boot wraps for weeks. Patient continues to have bilateral lower extremity.     Review of Systems     Objective:   Physical Exam  BP 123/71   Pulse 71   Resp 18   Past Medical History:  Diagnosis Date   Arthritis    knees (12/17/2017)   CAD S/P percutaneous coronary angioplasty 09/20/2016   Nstemi 08/2016. Dr. Claudene. DES- brilinta  and asa. Requests change to [facility] cardiology; 95% Ramus -> PCI Resolute Onyx DES 2.75 x 18   Central cord syndrome (HCC) 12/17/2017   Chronic combined systolic and diastolic heart failure (HCC) 10/09/2016   EF 45% and grade II diastolic after nstemi   Diet-controlled diabetes mellitus (HCC)    DJD (degenerative joint disease)    Gout    High cholesterol    Hypertension    NSTEMI (non-ST elevated myocardial infarction) (HCC) 09/20/2016   95% Ramus - > PCI    Obesity    Recurrent pulmonary embolism (HCC) 11/'14; 4/'19   a) Bilateral segmental and subsegmental pulmonary emboli with mild RV Strain.; b) after fall with C-spine Fxr --> Acute PE of right main pulmonary artery extending into multiple segments.    Social History   Socioeconomic History   Marital status: Widowed    Spouse name: Not on file   Number of children: Not on file   Years of education: Not on file   Highest education level: Not on file  Occupational History   Occupation: teacher  Tobacco Use   Smoking status: Former    Current packs/day: 0.00    Average packs/day: 1 pack/day for 13.0 years (13.0 ttl pk-yrs)    Types: Cigarettes    Start date: 07/13/1964    Quit date: 07/13/1977    Years since quitting: 46.8   Smokeless tobacco: Never  Vaping Use   Vaping status: Never Used  Substance and Sexual Activity   Alcohol use: Yes     Alcohol/week: 0.0 standard drinks of alcohol    Comment: holidays only   Drug use: No   Sexual activity: Not on file  Other Topics Concern   Not on file  Social History Narrative   Widowed 02/2020 after over 40 years married.  He has 3 children and 5 grandchildren. They also 5 great grandchildren.       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   Social Drivers of Health   Financial Resource Strain: Low Risk  (09/16/2023)   Overall Financial Resource Strain (CARDIA)    Difficulty of Paying Living Expenses: Not hard at all  Food Insecurity: No Food Insecurity (12/15/2023)   Hunger Vital Sign    Worried About Running Out of Food in the Last Year: Never true    Ran Out of Food in the Last Year: Never true  Transportation Needs: No Transportation Needs (12/15/2023)   PRAPARE - Administrator, Civil Service (Medical): No    Lack of Transportation (Non-Medical): No  Physical Activity: Insufficiently Active (09/16/2023)   Exercise Vital Sign    Days of Exercise per Week: 2 days    Minutes of Exercise per Session: 10 min  Stress: No Stress Concern Present (09/16/2023)   [employer] of Occupational Health - Occupational Stress Questionnaire    Feeling of Stress : Not at all  Social Connections: Moderately Integrated (12/15/2023)   Social Connection and Isolation Panel    Frequency of Communication with Friends and Family: Three times a week    Frequency of Social Gatherings with Friends and Family: Three times a week    Attends Religious Services: More than 4 times per year    Active Member of Clubs or Organizations: Yes    Attends Banker Meetings: 1 to 4 times per year    Marital Status: Widowed  Intimate Partner Violence: Not At Risk (12/15/2023)   Humiliation, Afraid, Rape, and Kick questionnaire    Fear of Current or Ex-Partner: No    Emotionally Abused: No    Physically Abused: No    Sexually Abused: No    Past  Surgical History:  Procedure Laterality Date   CARDIAC CATHETERIZATION N/A 09/19/2016   Procedure: Left Heart Cath and Coronary Angiography;  Surgeon: Salena Negri, MD;  Location: [facility] INVASIVE CV LAB;  Service: Cardiovascular: 95% proximal Ramus Intermedius --> PCI   CARDIAC CATHETERIZATION N/A 09/19/2016   Procedure: Coronary Stent Intervention;  Surgeon: Lonni Hanson, MD;  Location: [facility] INVASIVE CV LAB;  Service: Cardiovascular: 95% ramus intermedius;  Resolute Onyx 2.75 x 18 mm drug-eluting stent   CYSTOSCOPY/RETROGRADE/URETEROSCOPY/STONE EXTRACTION WITH BASKET  7992,8000   ureteral stone    MENISCUS REPAIR Left    knee open meniscetomy   TRANSTHORACIC ECHOCARDIOGRAM  2004   no lvh nl ejection fraction   TRANSTHORACIC ECHOCARDIOGRAM  09/19/2016   In setting of NSTEMI:  EF 45-50% with diffuse hypokinesis. GR 2 DD. Mild biatrial enlargement.    Family History  Problem Relation Age of Onset   Liver disease Mother     Allergies  Allergen Reactions   Ace Inhibitors     Angioedema   Gabapentin      Dizzy on 300 mg dose    Other Hives and Itching    msg       Latest Ref Rng & Units 05/06/2024    4:06 PM 12/18/2023    3:21 AM 12/17/2023    4:00 AM  CBC  WBC 3.8 - 10.8 Thousand/uL 7.4  6.6  6.8   Hemoglobin 13.2 - 17.1 g/dL 88.9  87.8  88.0   Hematocrit 38.5 - 50.0 % 33.9  38.9  37.8   Platelets 140 - 400 Thousand/uL 278  222  213       CMP     Component Value Date/Time   NA 137 05/06/2024 1606   K 4.5 05/06/2024 1606   CL 104 05/06/2024 1606   CO2 25 05/06/2024 1606   GLUCOSE 131 (H) 05/06/2024 1606   BUN 19 05/06/2024 1606   CREATININE 1.12 05/06/2024 1606   CALCIUM  8.6 05/06/2024 1606   PROT 6.7 05/06/2024 1606   ALBUMIN 3.7 12/01/2023 1528   AST 20 05/06/2024 1606   ALT 19 05/06/2024 1606   ALKPHOS 80 12/01/2023 1528   BILITOT 1.0 05/06/2024 1606   GFR 66.17 12/01/2023 1528   EGFR 69 05/06/2024 1606   GFRNONAA >60 12/18/2023 0321     No results found.      Assessment & Plan:   1. Bilateral lower extremity edema (Primary) Patient presents to clinic today with venous stasis of both lower extremities.   Patient is noted to have +3 to +4 edema to bilateral lower extremities  that is improving. Hyperkeratosis surrounds both of his lower extremities from the tops of his feet to mid calf.  He does not have any open sores.  Patient is morbidly obese and at this time walks very little.  He does complain of pain to his bilateral lower extremities due to the extreme swelling.    Patient will be placed in Unna boot wraps for 2 more weeks to be changed weekly.  Patient will return to our clinic for two more weeks for unna boot changes to be seen by a provider at week 3.   Patient then to be evaluated for compression socks.   2. Essential hypertension Continue antihypertensive medications as already ordered, these medications have been reviewed and there are no changes at this time.  3. Morbid obesity (HCC) I had a long detailed with the patient concerning his obesity and how it relates to his bilateral lower extremity leg swelling. I encouraged him to work on losing weight to take pressure and swelling off his legs. It will also help with better control of blood pressure.    Current Outpatient Medications on File Prior to Visit  Medication Sig Dispense Refill   acetaminophen  (TYLENOL ) 650 MG CR tablet Take 650-1,300 mg by mouth every 8 (eight) hours as needed for pain.     aspirin  EC 81 MG tablet Take 1 tablet (81 mg total) by mouth daily. 90 tablet 3   atorvastatin  (LIPITOR ) 80 MG tablet TAKE 1 TABLET BY MOUTH DAILY AT 6 PM. 90 tablet 3   Chromium  Picolinate (CHROMIUM  PICOLATE PO) Take 1 tablet by mouth 3 (three) times a week.     clotrimazole -betamethasone  (LOTRISONE ) cream Apply to both lower extremities twice daily. 45 g 3   docusate sodium  (COLACE) 100 MG capsule Take 1 capsule (100 mg total) by mouth 2 (two) times daily. 60 capsule 5   gabapentin   (NEURONTIN ) 100 MG capsule TAKE 1 CAPSULE (100 MG TOTAL) BY MOUTH THREE TIMES DAILY. 90 capsule 1   methocarbamol  (ROBAXIN ) 500 MG tablet Take 1 tablet (500 mg total) by mouth every 8 (eight) hours as needed for muscle spasms. 60 tablet 2   metoprolol  tartrate (LOPRESSOR ) 50 MG tablet Take 1 tablet (50 mg total) by mouth 2 (two) times daily. 180 tablet 3   Multiple Vitamin (MULTIVITAMIN WITH MINERALS) TABS tablet Take 1 tablet by mouth daily.     oxyCODONE  (OXY IR/ROXICODONE ) 5 MG immediate release tablet Take 1 tablet (5 mg total) by mouth every 6 (six) hours as needed for moderate pain (pain score 4-6) or breakthrough pain. 30 tablet 0   polyethylene glycol (MIRALAX  / GLYCOLAX ) 17 g packet Take 17 g by mouth daily as needed for mild constipation. 14 each 0   rivaroxaban  (XARELTO ) 20 MG TABS tablet Take 1 tablet (20 mg total) by mouth daily. 30 tablet 11   terazosin  (HYTRIN ) 10 MG capsule TAKE 1 CAPSULE BY MOUTH EVERYDAY AT BEDTIME 90 capsule 3   tirzepatide  (MOUNJARO ) 2.5 MG/0.5ML Pen Inject 2.5 mg into the skin once a week. 2 mL 1   ammonium lactate  (AMLACTIN) 12 % lotion Apply 1 application topically as needed for dry skin. (Patient not taking: Reported on 05/17/2024) 400 g 0   diclofenac  sodium (VOLTAREN ) 1 % GEL Apply 1 application topically 3 (three) times daily. Both knees (Patient not taking: Reported on 05/17/2024) 3 Tube 4   Dietary Management Product (VASCULERA) TABS Take 1 tablet once a day (Patient not taking: Reported on 05/17/2024) 30 tablet 5  fluticasone  (FLONASE ) 50 MCG/ACT nasal spray SPRAY 2 SPRAYS INTO EACH NOSTRIL EVERY DAY (Patient not taking: Reported on 05/17/2024) 48 mL 1   tacrolimus  (PROTOPIC ) 0.1 % ointment Apply to legs twice a day (Patient not taking: Reported on 05/17/2024) 100 g 2   vitamin C (ASCORBIC ACID) 500 MG tablet Take 500 mg by mouth daily in the afternoon. (Patient not taking: Reported on 05/17/2024)     No current facility-administered medications on file prior  to visit.    There are no Patient Instructions on file for this visit. No follow-ups on file.   Gwendlyn JONELLE Shank, NP

## 2024-05-25 DIAGNOSIS — M4804 Spinal stenosis, thoracic region: Secondary | ICD-10-CM | POA: Diagnosis not present

## 2024-05-26 ENCOUNTER — Ambulatory Visit (INDEPENDENT_AMBULATORY_CARE_PROVIDER_SITE_OTHER): Admitting: Nurse Practitioner

## 2024-05-26 ENCOUNTER — Encounter (INDEPENDENT_AMBULATORY_CARE_PROVIDER_SITE_OTHER): Payer: Self-pay

## 2024-05-26 VITALS — BP 148/78 | HR 77 | Resp 18

## 2024-05-26 DIAGNOSIS — R600 Localized edema: Secondary | ICD-10-CM | POA: Diagnosis not present

## 2024-05-26 NOTE — Progress Notes (Signed)
 History of Present Illness  There is no documented history at this time  Assessments & Plan   There are no diagnoses linked to this encounter.    Additional instructions  Subjective:  Patient presents with venous ulcer of the Bilateral lower extremity.    Procedure:  3 layer unna wrap was placed Bilateral lower extremity.   Plan:   Follow up in one week.

## 2024-05-30 DIAGNOSIS — M4804 Spinal stenosis, thoracic region: Secondary | ICD-10-CM | POA: Diagnosis not present

## 2024-06-01 ENCOUNTER — Telehealth: Payer: Self-pay | Admitting: Family Medicine

## 2024-06-01 DIAGNOSIS — M4804 Spinal stenosis, thoracic region: Secondary | ICD-10-CM | POA: Diagnosis not present

## 2024-06-01 NOTE — Telephone Encounter (Signed)
 Patient decided against doing Thyroid  Ultrasound.

## 2024-06-01 NOTE — Telephone Encounter (Signed)
-----   Message from Garnette Lukes sent at 06/01/2024  1:01 PM EDT ----- Team can you update me on the status of this? Looks like he needs to schedule still? ----- Message ----- From: SYSTEM Sent: 05/11/2024  12:28 AM EDT To: Garnette MALVA Lukes, MD

## 2024-06-02 ENCOUNTER — Ambulatory Visit (INDEPENDENT_AMBULATORY_CARE_PROVIDER_SITE_OTHER): Admitting: Nurse Practitioner

## 2024-06-02 ENCOUNTER — Encounter (INDEPENDENT_AMBULATORY_CARE_PROVIDER_SITE_OTHER): Payer: Self-pay | Admitting: Nurse Practitioner

## 2024-06-02 ENCOUNTER — Ambulatory Visit: Admitting: Podiatry

## 2024-06-02 VITALS — BP 147/86 | HR 90 | Resp 18

## 2024-06-02 DIAGNOSIS — Z91198 Patient's noncompliance with other medical treatment and regimen for other reason: Secondary | ICD-10-CM

## 2024-06-02 DIAGNOSIS — R600 Localized edema: Secondary | ICD-10-CM | POA: Diagnosis not present

## 2024-06-02 NOTE — Progress Notes (Signed)
 History of Present Illness  There is no documented history at this time  Assessments & Plan   There are no diagnoses linked to this encounter.    Additional instructions  Subjective:  Patient presents with venous ulcer of the Bilateral lower extremity.    Procedure:  3 layer unna wrap was placed Bilateral lower extremity.   Plan:   Follow up in one week.

## 2024-06-02 NOTE — Progress Notes (Signed)
 1. Failure to attend appointment with reason given    Patient went to [place] location; rescheduled for Jun 15, 2024, in [place] office.

## 2024-06-06 DIAGNOSIS — M4804 Spinal stenosis, thoracic region: Secondary | ICD-10-CM | POA: Diagnosis not present

## 2024-06-07 ENCOUNTER — Telehealth: Payer: Self-pay | Admitting: Family Medicine

## 2024-06-07 NOTE — Telephone Encounter (Signed)
 Okay to send? Looks like it did not get sent.   Copied from CRM [id number]. Topic: Clinical - Prescription Issue >> Jun 07, 2024  2:35 PM Rea ORN wrote: Reason for CRM: Pt called follow up on rx MOUNJARO  that was sent 9/12 during ov with PCP. Pt stated he checked with [facility] on [address] and they told him that they never received the prescription. Please call back to advise pt how soon this rx can be sent again.

## 2024-06-07 NOTE — Telephone Encounter (Signed)
 Disp Refills Start End   tirzepatide  (MOUNJARO ) 2.5 MG/0.5ML Pen 2 mL 1 05/06/2024 --   Sig - Route: Inject 2.5 mg into the skin once a week. - Subcutaneous   Sent to pharmacy as: tirzepatide  (MOUNJARO ) 2.5 MG/0.5ML Pen   E-Prescribing Status: Receipt confirmed by pharmacy (05/06/2024  3:49 PM EDT)    Definitely was sent and confirmed by pharmacy- you can resend

## 2024-06-08 ENCOUNTER — Other Ambulatory Visit: Payer: Self-pay | Admitting: Sports Medicine

## 2024-06-08 ENCOUNTER — Other Ambulatory Visit: Payer: Self-pay

## 2024-06-08 DIAGNOSIS — M4804 Spinal stenosis, thoracic region: Secondary | ICD-10-CM | POA: Diagnosis not present

## 2024-06-08 MED ORDER — TIRZEPATIDE 2.5 MG/0.5ML ~~LOC~~ SOAJ: 2.5000 mg | SUBCUTANEOUS | 1 refills | Status: AC

## 2024-06-08 NOTE — Telephone Encounter (Signed)
 Rx resent 06/08/2024

## 2024-06-09 ENCOUNTER — Ambulatory Visit (INDEPENDENT_AMBULATORY_CARE_PROVIDER_SITE_OTHER): Admitting: Vascular Surgery

## 2024-06-09 ENCOUNTER — Encounter (INDEPENDENT_AMBULATORY_CARE_PROVIDER_SITE_OTHER): Payer: Self-pay | Admitting: Vascular Surgery

## 2024-06-09 VITALS — BP 104/69 | HR 78 | Resp 18 | Ht 72.0 in | Wt 370.0 lb

## 2024-06-09 DIAGNOSIS — I10 Essential (primary) hypertension: Secondary | ICD-10-CM | POA: Diagnosis not present

## 2024-06-09 DIAGNOSIS — R600 Localized edema: Secondary | ICD-10-CM | POA: Diagnosis not present

## 2024-06-09 NOTE — Progress Notes (Signed)
 Subjective:    Patient ID: Franklin Hicks, male    DOB: 1951/06/09, 73 y.o.   MRN: [id number] Chief Complaint  Patient presents with   Follow-up    4 week follow up unna     Franklin Hicks is a 73 yo male who presents to clinic today in follow up after wearing Unna Boot wraps for 2 plus weeks. Patient continues to have bilateral lower extremity swelling to his feet and toes. He continues to have discoloration and hyperkeratosis. No open sores or ulcers at this time. Patients lymphedema is slowly resolving.    Review of Systems  Constitutional: Negative.   Cardiovascular:  Positive for leg swelling.  Musculoskeletal:  Positive for myalgias.  Skin:  Positive for color change.  All other systems reviewed and are negative.      Objective:   Physical Exam Constitutional:      Appearance: Normal appearance. He is obese.  HENT:     Head: Normocephalic.  Eyes:     Pupils: Pupils are equal, round, and reactive to light.  Cardiovascular:     Rate and Rhythm: Normal rate and regular rhythm.     Pulses: Normal pulses.     Heart sounds: Normal heart sounds.  Pulmonary:     Effort: Pulmonary effort is normal.     Breath sounds: Normal breath sounds.  Abdominal:     General: Abdomen is flat. Bowel sounds are normal.     Palpations: Abdomen is soft.  Musculoskeletal:        General: Swelling present. Normal range of motion.     Right lower leg: Edema present.     Left lower leg: Edema present.  Skin:    General: Skin is warm and dry.     Capillary Refill: Capillary refill takes 2 to 3 seconds.     Comments: Positive Keratosis of the bilateral lower extremities.   Neurological:     General: No focal deficit present.     Mental Status: He is alert and oriented to person, place, and time. Mental status is at baseline.  Psychiatric:        Mood and Affect: Mood normal.        Behavior: Behavior normal.        Thought Content: Thought content normal.        Judgment: Judgment normal.      BP 104/69 (BP Location: Left Arm, Patient Position: Sitting, Cuff Size: Large)   Pulse 78   Resp 18   Ht 6' (1.829 m)   Wt (!) 370 lb (167.8 kg)   BMI 50.18 kg/m   Past Medical History:  Diagnosis Date   Arthritis    knees (12/17/2017)   CAD S/P percutaneous coronary angioplasty 09/20/2016   Nstemi 08/2016. Dr. Claudene. DES- brilinta  and asa. Requests change to [facility] cardiology; 95% Ramus -> PCI Resolute Onyx DES 2.75 x 18   Central cord syndrome (HCC) 12/17/2017   Chronic combined systolic and diastolic heart failure (HCC) 10/09/2016   EF 45% and grade II diastolic after nstemi   Diet-controlled diabetes mellitus (HCC)    DJD (degenerative joint disease)    Gout    High cholesterol    Hypertension    NSTEMI (non-ST elevated myocardial infarction) (HCC) 09/20/2016   95% Ramus - > PCI    Obesity    Recurrent pulmonary embolism (HCC) 11/'14; 4/'19   a) Bilateral segmental and subsegmental pulmonary emboli with mild RV Strain.; b) after fall with C-spine Fxr -->  Acute PE of right main pulmonary artery extending into multiple segments.    Social History   Socioeconomic History   Marital status: Widowed    Spouse name: Not on file   Number of children: Not on file   Years of education: Not on file   Highest education level: Not on file  Occupational History   Occupation: teacher  Tobacco Use   Smoking status: Former    Current packs/day: 0.00    Average packs/day: 1 pack/day for 13.0 years (13.0 ttl pk-yrs)    Types: Cigarettes    Start date: 07/13/1964    Quit date: 07/13/1977    Years since quitting: 46.9   Smokeless tobacco: Never  Vaping Use   Vaping status: Never Used  Substance and Sexual Activity   Alcohol use: Yes    Alcohol/week: 0.0 standard drinks of alcohol    Comment: holidays only   Drug use: No   Sexual activity: Not on file  Other Topics Concern   Not on file  Social History Narrative   Widowed 02/2020 after over 40 years married.  He has 3  children and 5 grandchildren. They also 5 great grandchildren.       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   Social Drivers of Health   Financial Resource Strain: Low Risk  (09/16/2023)   Overall Financial Resource Strain (CARDIA)    Difficulty of Paying Living Expenses: Not hard at all  Food Insecurity: No Food Insecurity (12/15/2023)   Hunger Vital Sign    Worried About Running Out of Food in the Last Year: Never true    Ran Out of Food in the Last Year: Never true  Transportation Needs: No Transportation Needs (12/15/2023)   PRAPARE - Administrator, Civil Service (Medical): No    Lack of Transportation (Non-Medical): No  Physical Activity: Insufficiently Active (09/16/2023)   Exercise Vital Sign    Days of Exercise per Week: 2 days    Minutes of Exercise per Session: 10 min  Stress: No Stress Concern Present (09/16/2023)   [employer] of Occupational Health - Occupational Stress Questionnaire    Feeling of Stress : Not at all  Social Connections: Moderately Integrated (12/15/2023)   Social Connection and Isolation Panel    Frequency of Communication with Friends and Family: Three times a week    Frequency of Social Gatherings with Friends and Family: Three times a week    Attends Religious Services: More than 4 times per year    Active Member of Clubs or Organizations: Yes    Attends Banker Meetings: 1 to 4 times per year    Marital Status: Widowed  Intimate Partner Violence: Not At Risk (12/15/2023)   Humiliation, Afraid, Rape, and Kick questionnaire    Fear of Current or Ex-Partner: No    Emotionally Abused: No    Physically Abused: No    Sexually Abused: No    Past Surgical History:  Procedure Laterality Date   CARDIAC CATHETERIZATION N/A 09/19/2016   Procedure: Left Heart Cath and Coronary Angiography;  Surgeon: Salena Negri, MD;  Location: [facility] INVASIVE CV LAB;  Service: Cardiovascular: 95% proximal Ramus  Intermedius --> PCI   CARDIAC CATHETERIZATION N/A 09/19/2016   Procedure: Coronary Stent Intervention;  Surgeon: Lonni Hanson, MD;  Location: [facility] INVASIVE CV [facility];  Service: Cardiovascular: 95% ramus intermedius;  Resolute Onyx 2.75 x 18 mm drug-eluting stent   CYSTOSCOPY/RETROGRADE/URETEROSCOPY/STONE EXTRACTION WITH BASKET  7992,8000  ureteral stone    MENISCUS REPAIR Left    knee open meniscetomy   TRANSTHORACIC ECHOCARDIOGRAM  2004   no lvh nl ejection fraction   TRANSTHORACIC ECHOCARDIOGRAM  09/19/2016   In setting of NSTEMI:  EF 45-50% with diffuse hypokinesis. GR 2 DD. Mild biatrial enlargement.    Family History  Problem Relation Age of Onset   Liver disease Mother     Allergies  Allergen Reactions   Ace Inhibitors     Angioedema   Gabapentin      Dizzy on 300 mg dose    Other Hives and Itching    msg       Latest Ref Rng & Units 05/06/2024    4:06 PM 12/18/2023    3:21 AM 12/17/2023    4:00 AM  CBC  WBC 3.8 - 10.8 Thousand/uL 7.4  6.6  6.8   Hemoglobin 13.2 - 17.1 g/dL 88.9  87.8  88.0   Hematocrit 38.5 - 50.0 % 33.9  38.9  37.8   Platelets 140 - 400 Thousand/uL 278  222  213       CMP     Component Value Date/Time   NA 137 05/06/2024 1606   K 4.5 05/06/2024 1606   CL 104 05/06/2024 1606   CO2 25 05/06/2024 1606   GLUCOSE 131 (H) 05/06/2024 1606   BUN 19 05/06/2024 1606   CREATININE 1.12 05/06/2024 1606   CALCIUM  8.6 05/06/2024 1606   PROT 6.7 05/06/2024 1606   ALBUMIN 3.7 12/01/2023 1528   AST 20 05/06/2024 1606   ALT 19 05/06/2024 1606   ALKPHOS 80 12/01/2023 1528   BILITOT 1.0 05/06/2024 1606   GFR 66.17 12/01/2023 1528   EGFR 69 05/06/2024 1606   GFRNONAA >60 12/18/2023 0321     No results found.     Assessment & Plan:   1. Bilateral lower extremity edema (Primary) Patient presents to clinic today with venous stasis of both lower extremities.    Patient is noted to have +3 to +4 edema to bilateral lower extremities that is improving.  Hyperkeratosis surrounds both of his lower extremities from the tops of his feet to mid calf.  He does not have any open sores.  Patient is morbidly obese and at this time walks very little.  He does complain of pain to his bilateral lower extremities due to the extreme swelling.    Patient will be placed in Unna boot wraps for 2 more weeks to be changed weekly.  Patient will return to our clinic for two more weeks for unna boot changes to be seen by a provider at week 3.   Patient then to be evaluated for compression socks. Patient was informed to bring his compression socks to the visit with provider to change to compression socks.   2. Essential hypertension Continue antihypertensive medications as already ordered, these medications have been reviewed and there are no changes at this time.   3. Morbid obesity (HCC) I had a long detailed with the patient concerning his obesity and how it relates to his bilateral lower extremity leg swelling. I encouraged him to work on losing weight to take pressure and swelling off his legs. It will also help with better control of blood pressure.    Current Outpatient Medications on File Prior to Visit  Medication Sig Dispense Refill   acetaminophen  (TYLENOL ) 650 MG CR tablet Take 650-1,300 mg by mouth every 8 (eight) hours as needed for pain.     ammonium lactate  (  AMLACTIN) 12 % lotion Apply 1 application topically as needed for dry skin. 400 g 0   aspirin  EC 81 MG tablet Take 1 tablet (81 mg total) by mouth daily. 90 tablet 3   atorvastatin  (LIPITOR ) 80 MG tablet TAKE 1 TABLET BY MOUTH DAILY AT 6 PM. 90 tablet 3   Chromium  Picolinate (CHROMIUM  PICOLATE PO) Take 1 tablet by mouth 3 (three) times a week.     clotrimazole -betamethasone  (LOTRISONE ) cream Apply to both lower extremities twice daily. 45 g 3   diclofenac  sodium (VOLTAREN ) 1 % GEL Apply 1 application topically 3 (three) times daily. Both knees 3 Tube 4   Dietary Management Product (VASCULERA) TABS  Take 1 tablet once a day 30 tablet 5   docusate sodium  (COLACE) 100 MG capsule Take 1 capsule (100 mg total) by mouth 2 (two) times daily. 60 capsule 5   fluticasone  (FLONASE ) 50 MCG/ACT nasal spray SPRAY 2 SPRAYS INTO EACH NOSTRIL EVERY DAY 48 mL 1   gabapentin  (NEURONTIN ) 100 MG capsule TAKE 1 CAPSULE (100 MG TOTAL) BY MOUTH THREE TIMES DAILY. 90 capsule 1   methocarbamol  (ROBAXIN ) 500 MG tablet Take 1 tablet (500 mg total) by mouth every 8 (eight) hours as needed for muscle spasms. 60 tablet 2   metoprolol  tartrate (LOPRESSOR ) 50 MG tablet Take 1 tablet (50 mg total) by mouth 2 (two) times daily. 180 tablet 3   Multiple Vitamin (MULTIVITAMIN WITH MINERALS) TABS tablet Take 1 tablet by mouth daily.     oxyCODONE  (OXY IR/ROXICODONE ) 5 MG immediate release tablet Take 1 tablet (5 mg total) by mouth every 6 (six) hours as needed for moderate pain (pain score 4-6) or breakthrough pain. 30 tablet 0   polyethylene glycol (MIRALAX  / GLYCOLAX ) 17 g packet Take 17 g by mouth daily as needed for mild constipation. 14 each 0   rivaroxaban  (XARELTO ) 20 MG TABS tablet Take 1 tablet (20 mg total) by mouth daily. 30 tablet 11   tacrolimus  (PROTOPIC ) 0.1 % ointment Apply to legs twice a day 100 g 2   terazosin  (HYTRIN ) 10 MG capsule TAKE 1 CAPSULE BY MOUTH EVERYDAY AT BEDTIME 90 capsule 3   tirzepatide  (MOUNJARO ) 2.5 MG/0.5ML Pen Inject 2.5 mg into the skin once a week. 2 mL 1   vitamin C (ASCORBIC ACID) 500 MG tablet Take 500 mg by mouth daily in the afternoon.     No current facility-administered medications on file prior to visit.    There are no Patient Instructions on file for this visit. No follow-ups on file.   Gwendlyn JONELLE Shank, NP

## 2024-06-10 ENCOUNTER — Other Ambulatory Visit: Payer: Self-pay | Admitting: Adult Health

## 2024-06-10 DIAGNOSIS — E1159 Type 2 diabetes mellitus with other circulatory complications: Secondary | ICD-10-CM

## 2024-06-10 DIAGNOSIS — I2510 Atherosclerotic heart disease of native coronary artery without angina pectoris: Secondary | ICD-10-CM

## 2024-06-10 DIAGNOSIS — I5042 Chronic combined systolic (congestive) and diastolic (congestive) heart failure: Secondary | ICD-10-CM

## 2024-06-13 DIAGNOSIS — M4804 Spinal stenosis, thoracic region: Secondary | ICD-10-CM | POA: Diagnosis not present

## 2024-06-14 NOTE — Telephone Encounter (Signed)
 Prescription refill request for Xarelto  received.  Indication: Last office visit:needs appt Weight: Age: Scr: CrCl: Prescription refilled

## 2024-06-15 ENCOUNTER — Encounter: Payer: Self-pay | Admitting: Podiatry

## 2024-06-15 ENCOUNTER — Ambulatory Visit: Admitting: Podiatry

## 2024-06-15 DIAGNOSIS — M79675 Pain in left toe(s): Secondary | ICD-10-CM | POA: Diagnosis not present

## 2024-06-15 DIAGNOSIS — E1142 Type 2 diabetes mellitus with diabetic polyneuropathy: Secondary | ICD-10-CM | POA: Diagnosis not present

## 2024-06-15 DIAGNOSIS — B351 Tinea unguium: Secondary | ICD-10-CM

## 2024-06-15 DIAGNOSIS — M79674 Pain in right toe(s): Secondary | ICD-10-CM

## 2024-06-15 DIAGNOSIS — M4804 Spinal stenosis, thoracic region: Secondary | ICD-10-CM | POA: Diagnosis not present

## 2024-06-16 ENCOUNTER — Ambulatory Visit (INDEPENDENT_AMBULATORY_CARE_PROVIDER_SITE_OTHER): Admitting: Nurse Practitioner

## 2024-06-16 ENCOUNTER — Encounter (INDEPENDENT_AMBULATORY_CARE_PROVIDER_SITE_OTHER): Payer: Self-pay | Admitting: Nurse Practitioner

## 2024-06-16 VITALS — BP 134/80 | HR 73 | Resp 18 | Ht 72.0 in

## 2024-06-16 DIAGNOSIS — R600 Localized edema: Secondary | ICD-10-CM | POA: Diagnosis not present

## 2024-06-16 NOTE — Progress Notes (Signed)
 History of Present Illness  There is no documented history at this time  Assessments & Plan   There are no diagnoses linked to this encounter.    Additional instructions  Subjective:  Patient presents with venous ulcer of the Bilateral lower extremity.    Procedure:  3 layer unna wrap was placed Bilateral lower extremity.  Legs cleansed with antibacterial soap and water, bacitracin/gauze applied to lower right forefoot ulcer. Both legs wrapped with 3 layer zinc unna  Plan:   Follow up in one week.

## 2024-06-19 ENCOUNTER — Encounter (INDEPENDENT_AMBULATORY_CARE_PROVIDER_SITE_OTHER): Payer: Self-pay | Admitting: Nurse Practitioner

## 2024-06-21 ENCOUNTER — Other Ambulatory Visit: Payer: Self-pay | Admitting: Adult Health

## 2024-06-22 DIAGNOSIS — M4804 Spinal stenosis, thoracic region: Secondary | ICD-10-CM | POA: Diagnosis not present

## 2024-06-23 ENCOUNTER — Encounter (INDEPENDENT_AMBULATORY_CARE_PROVIDER_SITE_OTHER): Payer: Self-pay

## 2024-06-23 ENCOUNTER — Ambulatory Visit (INDEPENDENT_AMBULATORY_CARE_PROVIDER_SITE_OTHER): Admitting: Vascular Surgery

## 2024-06-23 VITALS — BP 131/65 | HR 77 | Resp 18 | Ht 72.0 in | Wt 370.0 lb

## 2024-06-23 DIAGNOSIS — R600 Localized edema: Secondary | ICD-10-CM

## 2024-06-23 DIAGNOSIS — I10 Essential (primary) hypertension: Secondary | ICD-10-CM | POA: Diagnosis not present

## 2024-06-23 NOTE — Progress Notes (Signed)
  Subjective:  Patient ID: Franklin Hicks, male    DOB: 17-Feb-1951,  MRN: [id number]  Franklin Hicks presents to clinic today for for annual diabetic foot examination, at risk foot care with history of diabetic neuropathy, and painful elongated mycotic toenails 1-5 bilaterally which are tender when wearing enclosed shoe gear. Pain is relieved with periodic professional debridement.  Chief Complaint  Patient presents with   RFC     RFC Non diabetic toenail trim. LOV with PCP 05/06/24   New problem(s): None.   PCP is Katrinka Garnette KIDD, MD.  Allergies  Allergen Reactions   Ace Inhibitors     Angioedema   Gabapentin      Dizzy on 300 mg dose    Other Hives and Itching    msg    Review of Systems: Negative except as noted in the HPI.  Objective: No changes noted in today's physical examination. There were no vitals filed for this visit. Franklin Hicks is a pleasant 73 y.o. male morbidly obese in NAD. AAO x 3.   Diabetic foot exam was performed with the following findings:   Vascular Examination: CFT <3 seconds b/l LE. Palpable DP pulse(s) b/l LE. Palpable PT pulse(s) b/l LE. Pedal hair absent. No pain with calf compression b/l. Lower extremity skin temperature gradient within normal limits. Wearing bilateral lower extremity compression wraps present which are clean, dry and intact. Evidence of skin changes consistent with long term venous stasis BLE. No ischemia or gangrene noted b/l LE. No cyanosis or clubbing noted b/l LE.  Neurological Examination: Sensation grossly intact b/l with 10 gram monofilament. Vibratory sensation intact b/l. Pt has subjective symptoms of neuropathy.  Dermatological Examination: No open wounds. No interdigital macerations.   Toenails 1-5 b/l thick, discolored, elongated with subungual debris and pain on dorsal palpation.   No hyperkeratotic nor porokeratotic lesions.       He continues to have improvement in plaque appearance/texture.   Musculoskeletal  Examination: Muscle strength 5/5 to all lower extremity muscle groups bilaterally. No pain, crepitus or joint limitation noted with ROM bilateral LE. Pes planus deformity noted bilateral LE. Utilizes wheelchair for mobility assistance.  Radiographs: None     Assessment/Plan: 1. Pain due to onychomycosis of toenails of both feet   2. Diabetic peripheral neuropathy associated with type 2 diabetes mellitus [facility])   Consent given for treatment. Diabetic foot examination performed.. All patient's and/or POA's questions/concerns addressed on today's visit. Mycotic toenails 1-5 b/l debrided in length and girth without incident. Continue foot and shoe inspections daily. Monitor blood glucose per PCP/Endocrinologist's recommendations.Continue soft, supportive shoe gear daily. Report any pedal injuries to medical professional. Call office if there are any quesitons/concerns. -Patient/POA to call should there be question/concern in the interim.   Return in about 3 months (around 09/15/2024).  Franklin Hicks, DPM      [facility] LOCATION: [address] [place] [address]                   Office [phone]   [facility] LOCATION: [address] [place], [place] [address] Office [phone]

## 2024-06-27 ENCOUNTER — Encounter: Payer: Self-pay | Admitting: Radiology

## 2024-06-27 DIAGNOSIS — M4804 Spinal stenosis, thoracic region: Secondary | ICD-10-CM | POA: Diagnosis not present

## 2024-06-28 NOTE — Progress Notes (Signed)
 Subjective:    Patient ID: Franklin Hicks, male    DOB: 1950/11/28, 73 y.o.   MRN: [id number] Chief Complaint  Patient presents with   Follow-up    2 week unna wrap follow up    Franklin Hicks is a 73 yo male who presents to clinic today in follow up after wearing Unna Boot wraps for 2 plus weeks. Patient continues to have bilateral lower extremity swelling to his feet and toes. He continues to have discoloration and hyperkeratosis. No open sores or ulcers at this time. Patients lymphedema is slowly resolving.     Review of Systems  Constitutional: Negative.   Cardiovascular:  Positive for leg swelling.  Skin:  Positive for color change.  All other systems reviewed and are negative.      Objective:   Physical Exam Constitutional:      Appearance: Normal appearance. He is obese.  HENT:     Head: Normocephalic.  Eyes:     Pupils: Pupils are equal, round, and reactive to light.  Cardiovascular:     Rate and Rhythm: Normal rate and regular rhythm.     Pulses: Normal pulses.     Heart sounds: Normal heart sounds.  Pulmonary:     Effort: Pulmonary effort is normal.     Breath sounds: Normal breath sounds.  Abdominal:     General: Bowel sounds are normal.     Palpations: Abdomen is soft.  Musculoskeletal:        General: Swelling present.     Right lower leg: Edema present.     Left lower leg: Edema present.  Skin:    General: Skin is warm and dry.     Capillary Refill: Capillary refill takes 2 to 3 seconds.     Comments: Keratosis to the skin of bilateral lower extremities due to chronic vascular insufficiency and lymphedema  Neurological:     General: No focal deficit present.     Mental Status: He is alert and oriented to person, place, and time. Mental status is at baseline.  Psychiatric:        Mood and Affect: Mood normal.        Behavior: Behavior normal.        Thought Content: Thought content normal.        Judgment: Judgment normal.     BP 131/65 (BP Location:  Left Arm, Patient Position: Sitting, Cuff Size: Large)   Pulse 77   Resp 18   Ht 6' (1.829 m)   Wt (!) 370 lb (167.8 kg)   BMI 50.18 kg/m   Past Medical History:  Diagnosis Date   Arthritis    knees (12/17/2017)   CAD S/P percutaneous coronary angioplasty 09/20/2016   Nstemi 08/2016. Dr. Claudene. DES- brilinta  and asa. Requests change to [facility] cardiology; 95% Ramus -> PCI Resolute Onyx DES 2.75 x 18   Central cord syndrome (HCC) 12/17/2017   Chronic combined systolic and diastolic heart failure (HCC) 10/09/2016   EF 45% and grade II diastolic after nstemi   Diet-controlled diabetes mellitus (HCC)    DJD (degenerative joint disease)    Gout    High cholesterol    Hypertension    NSTEMI (non-ST elevated myocardial infarction) (HCC) 09/20/2016   95% Ramus - > PCI    Obesity    Recurrent pulmonary embolism (HCC) 11/'14; 4/'19   a) Bilateral segmental and subsegmental pulmonary emboli with mild RV Strain.; b) after fall with C-spine Fxr --> Acute PE of right  main pulmonary artery extending into multiple segments.    Social History   Socioeconomic History   Marital status: Widowed    Spouse name: Not on file   Number of children: Not on file   Years of education: Not on file   Highest education level: Not on file  Occupational History   Occupation: teacher  Tobacco Use   Smoking status: Former    Current packs/day: 0.00    Average packs/day: 1 pack/day for 13.0 years (13.0 ttl pk-yrs)    Types: Cigarettes    Start date: 07/13/1964    Quit date: 07/13/1977    Years since quitting: 46.9   Smokeless tobacco: Never  Vaping Use   Vaping status: Never Used  Substance and Sexual Activity   Alcohol use: Yes    Alcohol/week: 0.0 standard drinks of alcohol    Comment: holidays only   Drug use: No   Sexual activity: Not on file  Other Topics Concern   Not on file  Social History Narrative   Widowed 02/2020 after over 40 years married.  He has 3 children and 5 grandchildren. They  also 5 great grandchildren.       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   Social Drivers of Health   Financial Resource Strain: Low Risk  (09/16/2023)   Overall Financial Resource Strain (CARDIA)    Difficulty of Paying Living Expenses: Not hard at all  Food Insecurity: No Food Insecurity (12/15/2023)   Hunger Vital Sign    Worried About Running Out of Food in the Last Year: Never true    Ran Out of Food in the Last Year: Never true  Transportation Needs: No Transportation Needs (12/15/2023)   PRAPARE - Administrator, Civil Service (Medical): No    Lack of Transportation (Non-Medical): No  Physical Activity: Insufficiently Active (09/16/2023)   Exercise Vital Sign    Days of Exercise per Week: 2 days    Minutes of Exercise per Session: 10 min  Stress: No Stress Concern Present (09/16/2023)   [employer] of Occupational Health - Occupational Stress Questionnaire    Feeling of Stress : Not at all  Social Connections: Moderately Integrated (12/15/2023)   Social Connection and Isolation Panel    Frequency of Communication with Friends and Family: Three times a week    Frequency of Social Gatherings with Friends and Family: Three times a week    Attends Religious Services: More than 4 times per year    Active Member of Clubs or Organizations: Yes    Attends Banker Meetings: 1 to 4 times per year    Marital Status: Widowed  Intimate Partner Violence: Not At Risk (12/15/2023)   Humiliation, Afraid, Rape, and Kick questionnaire    Fear of Current or Ex-Partner: No    Emotionally Abused: No    Physically Abused: No    Sexually Abused: No    Past Surgical History:  Procedure Laterality Date   CARDIAC CATHETERIZATION N/A 09/19/2016   Procedure: Left Heart Cath and Coronary Angiography;  Surgeon: Salena Negri, MD;  [facility]: [facility];  Service: Cardiovascular: 95% proximal Ramus Intermedius --> PCI   CARDIAC  CATHETERIZATION N/A 09/19/2016   Procedure: Coronary Stent Intervention;  Surgeon: Lonni Hanson, MD;  Location: [facility];  Service: Cardiovascular: 95% ramus intermedius;  Resolute Onyx 2.75 x 18 mm drug-eluting stent   CYSTOSCOPY/RETROGRADE/URETEROSCOPY/STONE EXTRACTION WITH BASKET  7992,8000   ureteral stone  MENISCUS REPAIR Left    knee open meniscetomy   TRANSTHORACIC ECHOCARDIOGRAM  2004   no lvh nl ejection fraction   TRANSTHORACIC ECHOCARDIOGRAM  09/19/2016   In setting of NSTEMI:  EF 45-50% with diffuse hypokinesis. GR 2 DD. Mild biatrial enlargement.    Family History  Problem Relation Age of Onset   Liver disease Mother     Allergies  Allergen Reactions   Ace Inhibitors     Angioedema   Gabapentin      Dizzy on 300 mg dose    Other Hives and Itching    msg       Latest Ref Rng & Units 05/06/2024    4:06 PM 12/18/2023    3:21 AM 12/17/2023    4:00 AM  CBC  WBC 3.8 - 10.8 Thousand/uL 7.4  6.6  6.8   Hemoglobin 13.2 - 17.1 g/dL 88.9  87.8  88.0   Hematocrit 38.5 - 50.0 % 33.9  38.9  37.8   Platelets 140 - 400 Thousand/uL 278  222  213       CMP     Component Value Date/Time   NA 137 05/06/2024 1606   K 4.5 05/06/2024 1606   CL 104 05/06/2024 1606   CO2 25 05/06/2024 1606   GLUCOSE 131 (H) 05/06/2024 1606   BUN 19 05/06/2024 1606   CREATININE 1.12 05/06/2024 1606   CALCIUM  8.6 05/06/2024 1606   PROT 6.7 05/06/2024 1606   ALBUMIN 3.7 12/01/2023 1528   AST 20 05/06/2024 1606   ALT 19 05/06/2024 1606   ALKPHOS 80 12/01/2023 1528   BILITOT 1.0 05/06/2024 1606   GFR 66.17 12/01/2023 1528   EGFR 69 05/06/2024 1606   GFRNONAA >60 12/18/2023 0321     No results found.     Assessment & Plan:   1. Bilateral lower extremity edema (Primary) Patient presents to clinic today with venous stasis of both lower extremities.    Patient is noted to have +3 to +4 edema to bilateral lower extremities that continue to improving. Hyperkeratosis  surrounds both of his lower extremities from the tops of his feet to mid calf.  He does not have any open sores.  Patient is morbidly obese and at this time walks very little.  He does complain of pain to his bilateral lower extremities due to the extreme swelling.    Patient will be placed in compression socks he has brought with him today for 4 weeks.  Patient will return to our clinic in 4 weeks for re-evaluation of his lower extremities.   2. Essential hypertension Continue antihypertensive medications as already ordered, these medications have been reviewed and there are no changes at this time.   3. Morbid obesity (HCC) I had a long detailed with the patient concerning his obesity and how it relates to his bilateral lower extremity leg swelling. I encouraged him to work on losing weight to take pressure and swelling off his legs. It will also help with better control of blood pressure.    Current Outpatient Medications on File Prior to Visit  Medication Sig Dispense Refill   acetaminophen  (TYLENOL ) 650 MG CR tablet Take 650-1,300 mg by mouth every 8 (eight) hours as needed for pain.     ammonium lactate  (AMLACTIN) 12 % lotion Apply 1 application topically as needed for dry skin. 400 g 0   aspirin  EC 81 MG tablet Take 1 tablet (81 mg total) by mouth daily. 90 tablet 3   atorvastatin  (LIPITOR ) 80  MG tablet TAKE 1 TABLET BY MOUTH DAILY AT 6 PM. 90 tablet 3   Chromium  Picolinate (CHROMIUM  PICOLATE PO) Take 1 tablet by mouth 3 (three) times a week.     clotrimazole -betamethasone  (LOTRISONE ) cream Apply to both lower extremities twice daily. 45 g 3   diclofenac  sodium (VOLTAREN ) 1 % GEL Apply 1 application topically 3 (three) times daily. Both knees 3 Tube 4   Dietary Management Product (VASCULERA) TABS Take 1 tablet once a day 30 tablet 5   docusate sodium  (COLACE) 100 MG capsule Take 1 capsule (100 mg total) by mouth 2 (two) times daily. 60 capsule 5   fluticasone  (FLONASE ) 50 MCG/ACT nasal  spray SPRAY 2 SPRAYS INTO EACH NOSTRIL EVERY DAY 48 mL 1   gabapentin  (NEURONTIN ) 100 MG capsule TAKE 1 CAPSULE (100 MG TOTAL) BY MOUTH THREE TIMES DAILY. 90 capsule 1   methocarbamol  (ROBAXIN ) 500 MG tablet Take 1 tablet (500 mg total) by mouth every 8 (eight) hours as needed for muscle spasms. 60 tablet 2   metoprolol  tartrate (LOPRESSOR ) 50 MG tablet TAKE 1 TABLET BY MOUTH TWICE A DAY 60 tablet 0   Multiple Vitamin (MULTIVITAMIN WITH MINERALS) TABS tablet Take 1 tablet by mouth daily.     oxyCODONE  (OXY IR/ROXICODONE ) 5 MG immediate release tablet Take 1 tablet (5 mg total) by mouth every 6 (six) hours as needed for moderate pain (pain score 4-6) or breakthrough pain. 30 tablet 0   polyethylene glycol (MIRALAX  / GLYCOLAX ) 17 g packet Take 17 g by mouth daily as needed for mild constipation. 14 each 0   rivaroxaban  (XARELTO ) 20 MG TABS tablet Take 1 tablet (20 mg total) by mouth daily. NEEDS CARDIOLOGY APPT FOR REFILLS, CALL OFFICE [phone].  THANK YOU 30 tablet 0   tacrolimus  (PROTOPIC ) 0.1 % ointment Apply to legs twice a day 100 g 2   terazosin  (HYTRIN ) 10 MG capsule TAKE 1 CAPSULE BY MOUTH EVERYDAY AT BEDTIME 90 capsule 3   tirzepatide  (MOUNJARO ) 2.5 MG/0.5ML Pen Inject 2.5 mg into the skin once a week. 2 mL 1   vitamin C (ASCORBIC ACID) 500 MG tablet Take 500 mg by mouth daily in the afternoon.     No current facility-administered medications on file prior to visit.    There are no Patient Instructions on file for this visit. No follow-ups on file.   Gwendlyn JONELLE Shank, NP

## 2024-06-29 ENCOUNTER — Encounter: Attending: Physical Medicine & Rehabilitation | Admitting: Physical Medicine & Rehabilitation

## 2024-06-29 ENCOUNTER — Encounter: Payer: Self-pay | Admitting: Physical Medicine & Rehabilitation

## 2024-06-29 VITALS — BP 131/76 | HR 69 | Ht 72.0 in

## 2024-06-29 DIAGNOSIS — M4804 Spinal stenosis, thoracic region: Secondary | ICD-10-CM | POA: Diagnosis not present

## 2024-06-29 DIAGNOSIS — M5136 Other intervertebral disc degeneration, lumbar region with discogenic back pain only: Secondary | ICD-10-CM | POA: Diagnosis not present

## 2024-06-29 DIAGNOSIS — M62830 Muscle spasm of back: Secondary | ICD-10-CM | POA: Diagnosis not present

## 2024-06-29 DIAGNOSIS — S14104A Unspecified injury at C4 level of cervical spinal cord, initial encounter: Secondary | ICD-10-CM

## 2024-06-29 DIAGNOSIS — S14104S Unspecified injury at C4 level of cervical spinal cord, sequela: Secondary | ICD-10-CM | POA: Diagnosis not present

## 2024-06-29 DIAGNOSIS — M170 Bilateral primary osteoarthritis of knee: Secondary | ICD-10-CM | POA: Insufficient documentation

## 2024-06-29 DIAGNOSIS — R600 Localized edema: Secondary | ICD-10-CM | POA: Insufficient documentation

## 2024-06-29 MED ORDER — BACLOFEN 10 MG PO TABS: 10.0000 mg | ORAL_TABLET | Freq: Three times a day (TID) | ORAL | 3 refills | Status: DC

## 2024-06-29 NOTE — Progress Notes (Signed)
 Subjective:    Patient ID: Franklin Hicks, male    DOB: 1951-05-04, 73 y.o.   MRN: [id number]  HPI This is an initial office visit for [name] who is a 73 year old male with a history of C4 spinal cord injury with tetraplegia, diabetes with peripheral neuropathy, osteoarthritis of bilateral knees, thoraco and lumbar spondylosis with question at least at 1 point of osteomyelitis, as well as morbid obesity and chronic lower extremity edema.  I actually saw him in the office in 2020 in relation to his C4 spinal cord injury.  He was ambulating with his walker short distances at that point but has further declined over these past 5 years and is essentially using his wheelchair.  He's back here due to persistent pain in his flanks.   He was in the hospital with increased back pain in April. There was a concern of osteomyelitis.MRI's revealed:   1. Little edema and enhancement at the level of T11-12 disc widening and endplate destruction which has been present by CT since at least 07/28/2023, favor neuropathic level or remote discitis. 2. T8-T11 ankylosis. 3. At least moderate spinal stenosis at T11-12. Bilateral foraminal impingement involving T7-8 to T11-12.   Lumbar spine:   1. No acute finding. 2. L4-S1 ankylosis. 3. Short pedicles and degeneration cause mild-to-moderate spinal stenosis at L2-3 and L3-4.  He went home after this admission and did HH and then outpt therapy. The pain may be a little better since then.  He still has spasms radiating from his low back to his flanks and abdomen with a dull, aching in his spine. Dry needling and therapy seem to help the pain. He takes a horse liniment as well another topical to help locally with pain. He takes over 3000mg  per day of tylenol . He's off oxycodone . He takes gabapentin  100mg  tid for nerve pain in his back.  Bowel and bladder are working normally. Skin is intact.  He's sleeping well.   Pain Inventory Average Pain 6 Pain Right  Now 6 My pain is constant, dull, and aching  In the last 24 hours, has pain interfered with the following? General activity 2 Relation with others 0 Enjoyment of life 3 What TIME of day is your pain at its worst? morning  Sleep (in general) Good  Pain is worse with: bending, standing, and some activites Pain improves with: rest, therapy/exercise, medication, and dry needling Relief from Meds: 7  walk with assistance use a walker ability to climb steps?  no do you drive?  yes use a wheelchair transfers alone Do you have any goals in this area?  yes  retired Do you have any goals in this area?  yes  numbness trouble walking spasms  Any changes since last visit?  no  Any changes since last visit?  no    Family History  Problem Relation Age of Onset   Liver disease Mother    Social History   Socioeconomic History   Marital status: Widowed    Spouse name: Not on file   Number of children: Not on file   Years of education: Not on file   Highest education level: Not on file  Occupational History   Occupation: teacher  Tobacco Use   Smoking status: Former    Current packs/day: 0.00    Average packs/day: 1 pack/day for 13.0 years (13.0 ttl pk-yrs)    Types: Cigarettes    Start date: 07/13/1964    Quit date: 07/13/1977  Years since quitting: 46.9   Smokeless tobacco: Never  Vaping Use   Vaping status: Never Used  Substance and Sexual Activity   Alcohol use: Yes    Alcohol/week: 0.0 standard drinks of alcohol    Comment: holidays only   Drug use: No   Sexual activity: Not on file  Other Topics Concern   Not on file  Social History Narrative   Widowed 02/2020 after over 40 years married.  He has 3 children and 5 grandchildren. They also 5 great grandchildren.       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   Social Drivers of Health   Financial Resource Strain: Low Risk  (09/16/2023)   Overall Financial Resource  Strain (CARDIA)    Difficulty of Paying Living Expenses: Not hard at all  Food Insecurity: No Food Insecurity (12/15/2023)   Hunger Vital Sign    Worried About Running Out of Food in the Last Year: Never true    Ran Out of Food in the Last Year: Never true  Transportation Needs: No Transportation Needs (12/15/2023)   PRAPARE - Administrator, Civil Service (Medical): No    Lack of Transportation (Non-Medical): No  Physical Activity: Insufficiently Active (09/16/2023)   Exercise Vital Sign    Days of Exercise per Week: 2 days    Minutes of Exercise per Session: 10 min  Stress: No Stress Concern Present (09/16/2023)   [employer] of Occupational Health - Occupational Stress Questionnaire    Feeling of Stress : Not at all  Social Connections: Moderately Integrated (12/15/2023)   Social Connection and Isolation Panel    Frequency of Communication with Friends and Family: Three times a week    Frequency of Social Gatherings with Friends and Family: Three times a week    Attends Religious Services: More than 4 times per year    Active Member of Clubs or Organizations: Yes    Attends Banker Meetings: 1 to 4 times per year    Marital Status: Widowed   Past Surgical History:  Procedure Laterality Date   CARDIAC CATHETERIZATION N/A 09/19/2016   Procedure: Left Heart Cath and Coronary Angiography;  Surgeon: Salena Negri, MD;  Location: [facility];  Service: Cardiovascular: 95% proximal Ramus Intermedius --> PCI   CARDIAC CATHETERIZATION N/A 09/19/2016   Procedure: Coronary Stent Intervention;  Surgeon: Lonni Hanson, MD;  Location: [facility];  Service: Cardiovascular: 95% ramus intermedius;  Resolute Onyx 2.75 x 18 mm drug-eluting stent   CYSTOSCOPY/RETROGRADE/URETEROSCOPY/STONE EXTRACTION WITH BASKET  7992,8000   ureteral stone    MENISCUS REPAIR Left    knee open meniscetomy   TRANSTHORACIC ECHOCARDIOGRAM  2004   no lvh nl ejection fraction    TRANSTHORACIC ECHOCARDIOGRAM  09/19/2016   In setting of NSTEMI:  EF 45-50% with diffuse hypokinesis. GR 2 DD. Mild biatrial enlargement.   Past Medical History:  Diagnosis Date   Arthritis    knees (12/17/2017)   CAD S/P percutaneous coronary angioplasty 09/20/2016   Nstemi 08/2016. Dr. Negri. DES- brilinta  and asa. Requests change to [facility] cardiology; 95% Ramus -> PCI Resolute Onyx DES 2.75 x 18   Central cord syndrome (HCC) 12/17/2017   Chronic combined systolic and diastolic heart failure (HCC) 10/09/2016   EF 45% and grade II diastolic after nstemi   Diet-controlled diabetes mellitus (HCC)    DJD (degenerative joint disease)    Gout    High cholesterol    Hypertension  NSTEMI (non-ST elevated myocardial infarction) (HCC) 09/20/2016   95% Ramus - > PCI    Obesity    Recurrent pulmonary embolism (HCC) 11/'14; 4/'19   a) Bilateral segmental and subsegmental pulmonary emboli with mild RV Strain.; b) after fall with C-spine Fxr --> Acute PE of right main pulmonary artery extending into multiple segments.   There were no vitals taken for this visit.  Opioid Risk Score:   Fall Risk Score:  `1  Depression screen [facility] 2/9     05/06/2024    3:11 PM 09/16/2023    2:33 PM 07/17/2023   11:18 AM 07/09/2022    3:19 PM 07/07/2022   10:10 AM 07/01/2022    3:14 PM 06/27/2021    2:45 PM  Depression screen [facility] 2/9  Decreased Interest 0 0 0 0 0 0 0  Down, Depressed, Hopeless 0 0 0 0 0 0 0  PHQ - 2 Score 0 0 0 0 0 0 0  Altered sleeping 0     0   Tired, decreased energy 0     0   Change in appetite 0     0   Feeling bad or failure about yourself  0     0   Trouble concentrating 0     0   Moving slowly or fidgety/restless 0     0   Suicidal thoughts 0     0   PHQ-9 Score 0     0   Difficult doing work/chores Not difficult at all     Not difficult at all     Review of Systems  Musculoskeletal:  Positive for back pain and gait problem.       Pain right wrist, left knee stiffness  All  other systems reviewed and are negative.      Objective:   Physical Exam Gen: no distress, normal appearing, MORBIDLY OBESE HEENT: oral mucosa pink and moist, NCAT Cardio: Reg rate Chest: normal effort, normal rate of breathing Abd: soft, non-distended Ext: no edema Psych: pleasant, normal affect Skin: intact Neuro: Alert and oriented x 3. Normal insight and awareness. Intact Memory. Normal language and speech. Cranial nerve exam unremarkable. MMT: Patient's motor scores are 5 out of 5 in both upper extremities proximal and distal.  Lower extremities he has 4 out of 5 hip flexors knee extensors and 5 out of 5 distally.  Reflexes are brisk especially in the lower extremities.  Sensory exam appears to be intact to light touch, proprioception, and pain in all fours..   Musculoskeletal: Patient has pain with palpation over the mid to lower lumbar spine.  There is palpable muscle spasm of the paraspinals particularly on the left side.  He does not have discomfort with palpation over the flanks and abdomen.  I did not appreciate any muscle spasm there.       Assessment & Plan:  1. Functional Deficits Secondary to C4 Spinal Cord Injury:            Continue with outpt  Physical Therapy.            -discussed HEP              2. Neuropathic pain in feet?: Continue Gabapentin   3. Bilateral OA of Bilateral Knees : Continue  Voltaren  Gel            -weight loss was discussed 4. Morbid Obesity: Continue with Healthy Diet Regimen 5.  Chronic low back pain with ankylosis and spondylosis  on most recent MRI from April.  Symptoms are consistent with spasm as well as some referred pain from his back area.  There are no signs of consistent radiculopathy.  Could be an element of his spinal cord injury playing a role here especially with spasticity although he is not spastic elsewhere  - Continue with outpatient therapies working on modalities including dry needling.  He also needs to establish a good  stretching and home exercise program  - Will transition from methocarbamol  to baclofen 10 mg 3 times daily.  He has been taking methocarbamol  on a scheduled basis despite it being written as needed.  He was asked to call me with any problems with the transition.  - Consider increasing gabapentin  slightly for referred pain, continue at 100 mg 3 times daily for now.  He did not tolerate more than 300 mg in the past. 6.  Lower extremity edema, continue per vascular surgery recommendations  - Maintain compression stockings, weight loss important as well. 7.  Bowels and bladder  - Both appear to be functioning fairly regularly at this point.    Forty-five minutes of face to face patient care time were spent during this visit. All questions were encouraged and answered. Follow up with me in 2 months.

## 2024-06-29 NOTE — Patient Instructions (Addendum)
 ALWAYS FEEL FREE TO CALL OUR OFFICE WITH ANY PROBLEMS OR QUESTIONS [phone])  **PLEASE NOTE** ALL MEDICATION REFILL REQUESTS (INCLUDING CONTROLLED SUBSTANCES) NEED TO BE MADE AT LEAST 7 DAYS PRIOR TO REFILL BEING DUE. ANY REFILL REQUESTS INSIDE THAT TIME FRAME MAY RESULT IN DELAYS IN RECEIVING YOUR PRESCRIPTION.    BACLOFEN 10MG  TABLET:  TAKE 1/2 TABLET TWO X DAILY FOR 4 DAYS, THEN  TAKE 1 TABLET TWO X DAILY FOR 4 DAYS, THEN  TAKE 1 TABLET 3 X DAILY THEREAFTER   METHOCARBAMOL .  TAKE 1 TABLET 3 X DAILY FOR 8 DAYS, THEN  TAKE 1 TABLET 2 X DAILY FOR 4 DAYS, THEN  TAKE 1 TABLET EVERY 8 HOURS AS NEEDED.

## 2024-07-04 DIAGNOSIS — M4804 Spinal stenosis, thoracic region: Secondary | ICD-10-CM | POA: Diagnosis not present

## 2024-07-06 DIAGNOSIS — M4804 Spinal stenosis, thoracic region: Secondary | ICD-10-CM | POA: Diagnosis not present

## 2024-07-11 DIAGNOSIS — M4804 Spinal stenosis, thoracic region: Secondary | ICD-10-CM | POA: Diagnosis not present

## 2024-07-12 ENCOUNTER — Other Ambulatory Visit: Payer: Self-pay | Admitting: Cardiology

## 2024-07-12 NOTE — Telephone Encounter (Signed)
*  STAT* If patient is at the pharmacy, call can be transferred to refill team.   1. Which medications need to be refilled? (please list name of each medication and dose if known) rivaroxaban  (XARELTO ) 20 MG TABS tablet    2. Would you like to learn more about the convenience, safety, & potential cost savings by using the [facility]?    3. Are you open to using the [facility] (Type [facility]. ).   4. Which pharmacy/location (including street and city if local pharmacy) is medication to be sent to? [id number] [address], [place] - [address] Phone: [phone]  Fax: [phone]       5. Do they need a 30 day or 90 day supply? 30 Pt made appt for 11/25. Pt is out of medication

## 2024-07-13 MED ORDER — RIVAROXABAN 20 MG PO TABS: 20.0000 mg | ORAL_TABLET | Freq: Every day | ORAL | 0 refills | Status: DC

## 2024-07-13 NOTE — Telephone Encounter (Signed)
 Prescription refill request for Xarelto  received.  Indication:pe Last office visit:upcoming Weight:167.8  kg Age:74 Scr:1.12  9/25 CrCl:135.26  ml/min  Prescription refilled

## 2024-07-13 NOTE — Telephone Encounter (Signed)
 Prescription refill request for Xarelto  received.  Indication:  PE, WILL FWD TO ANTICOAG POOL Last office visit: Weight: Age: Scr: CrCl:

## 2024-07-18 DIAGNOSIS — M4804 Spinal stenosis, thoracic region: Secondary | ICD-10-CM | POA: Diagnosis not present

## 2024-07-19 ENCOUNTER — Encounter: Payer: Self-pay | Admitting: Cardiology

## 2024-07-19 ENCOUNTER — Ambulatory Visit: Attending: Cardiology | Admitting: Cardiology

## 2024-07-19 VITALS — BP 130/70 | HR 66 | Resp 16 | Ht 72.0 in | Wt 375.0 lb

## 2024-07-19 DIAGNOSIS — I5042 Chronic combined systolic (congestive) and diastolic (congestive) heart failure: Secondary | ICD-10-CM | POA: Diagnosis not present

## 2024-07-19 DIAGNOSIS — E1159 Type 2 diabetes mellitus with other circulatory complications: Secondary | ICD-10-CM | POA: Diagnosis not present

## 2024-07-19 DIAGNOSIS — Z9861 Coronary angioplasty status: Secondary | ICD-10-CM | POA: Diagnosis not present

## 2024-07-19 DIAGNOSIS — I152 Hypertension secondary to endocrine disorders: Secondary | ICD-10-CM

## 2024-07-19 DIAGNOSIS — I2510 Atherosclerotic heart disease of native coronary artery without angina pectoris: Secondary | ICD-10-CM

## 2024-07-19 MED ORDER — RIVAROXABAN 20 MG PO TABS
20.0000 mg | ORAL_TABLET | Freq: Every day | ORAL | 3 refills | Status: AC
Start: 2024-07-19 — End: ?

## 2024-07-19 NOTE — Progress Notes (Signed)
 Cardiology Office Note:  .   Date:  07/19/2024  ID:  Franklin Hicks, DOB [birth year], MRN [id number] PCP: Katrinka Garnette KIDD, MD  [facility] Providers Cardiologist:  Alm Clay, MD {  History of Present Illness: .   Franklin Hicks is a 73 y.o. male with history of CAD with DES to ramus intermediate, heart failure with recovered EF, recurrent PE, AAA, C4 spinal cord injury with tetraplegia, diabetes with peripheral neuropathy     CAD 2018 NSTEMI,  left heart catheterization single-vessel disease with DES to ramus intermedius.  EF 45 to 50%. 03/2022 EF recovered through 11/2023  Social history  Former smoker, occasional drinks.  No drugs.     Patient with history of CAD with DES to RI.  Last seen in office 04/2023 without any acute complaints.  Patient is seen on an annual basis and has been stable for multiple years now.  Today patient presents for annual follow-up.  He has no acute complaints whatsoever and he was that he has done well.  Compliant with all of his medications.  His back pain is his primary concern however he has been working with physical therapy and other medical professionals to help improve this.  He is excited for Thanksgiving.  ROS: Denies: Chest pain, shortness of breath, orthopnea, peripheral edema, palpitations, decreased exercise intolerance, fatigue, lightheadedness.   Studies Reviewed: Franklin Hicks    EKG Interpretation Date/Time:  Tuesday July 19 2024 14:45:14 EST Ventricular Rate:  65 PR Interval:  148 QRS Duration:  84 QT Interval:  392 QTC Calculation: 407 R Axis:   -11  Text Interpretation: Normal sinus rhythm Normal ECG When compared with ECG of 16-Jan-2021 22:32, PREVIOUS ECG IS PRESENT Confirmed by Darryle Currier [phone]) on 07/19/2024 2:51:07 PM    Risk Assessment/Calculations:             Physical Exam:   VS:  BP 130/70 (BP Location: Left Arm, Patient Position: Sitting)   Pulse 66   Resp 16   Ht 6' (1.829 m)   Wt (!) 375 lb (170.1 kg)    SpO2 97%   BMI 50.86 kg/m    Wt Readings from Last 3 Encounters:  07/19/24 (!) 375 lb (170.1 kg)  06/23/24 (!) 370 lb (167.8 kg)  06/09/24 (!) 370 lb (167.8 kg)    GEN: Well nourished, well developed in no acute distress.  In wheelchair NECK: No JVD; No carotid bruits CARDIAC: RRR, no murmurs, rubs, gallops RESPIRATORY:  Clear to auscultation without rales, wheezing or rhonchi  ABDOMEN: Soft, non-tender, non-distended EXTREMITIES:  No edema; No deformity.  Wearing compression stockings.  ASSESSMENT AND PLAN: .    CAD - 2018 DES to ramus intermediate Stable disease with no anginal complaints or equivalents. Chronically has been on aspirin  and Xarelto  20 mg, likely could proceed with Xarelto  monotherapy.  Will discuss with his primary cardiologist.  Continue with atorvastatin  80 mg, Lopressor  50 mg twice daily. LDL 11/2023 was 50.    Heart failure with recovered EF EF has been recovered since 2023-2025.  No significant valvular disease noted.  Euvolemic on exam with some peripheral edema but with compression stockings that is chronic for him.  Recurrent PE He is on lifelong Xarelto  20 mg daily.  Will provide a refill today.  Aortic dilatation Echocardiogram 11/2023 with aorta dilatation of 39 mm.  Consider repeating study in approximately 3 years.  Diabetes A1c 6.7%  BMI 50 Difficult situation since he is wheelchair-bound.  He tries to do  is much as he physically he is able to.  Dispo: 1 year follow up   Signed, Thom LITTIE Sluder, PA-C

## 2024-07-19 NOTE — Patient Instructions (Signed)
 Follow-Up: At [facility], you and your health needs are our priority.  As part of our continuing mission to provide you with exceptional heart care, our providers are all part of one team.  This team includes your primary Cardiologist (physician) and Advanced Practice Providers or APPs (Physician Assistants and Nurse Practitioners) who all work together to provide you with the care you need, when you need it.  Your next appointment:   1 year(s)  Provider:   Alm Clay, MD

## 2024-07-20 DIAGNOSIS — M4804 Spinal stenosis, thoracic region: Secondary | ICD-10-CM | POA: Diagnosis not present

## 2024-07-25 ENCOUNTER — Telehealth: Payer: Self-pay

## 2024-07-25 DIAGNOSIS — M4804 Spinal stenosis, thoracic region: Secondary | ICD-10-CM | POA: Diagnosis not present

## 2024-07-25 NOTE — Telephone Encounter (Signed)
-----   Message from Thom LITTIE Sluder sent at 07/25/2024  2:49 PM EST ----- Regarding: FW: Question about aspirin  plus Xarelto  Can you please let the patient know that he can be on monotherapy with Xarelto ? There's no need for aspirin  this far out from his stent placement. Thanks. ----- Message ----- From: Anner Alm ORN, MD Sent: 07/23/2024   3:56 PM EST To: Thom LITTIE Sluder, PA-C Subject: Question about aspirin  plus Xarelto             No.  Can be on Xarelto  alone. ----- Message ----- From: Sluder Thom LITTIE DEVONNA Sent: 07/19/2024   3:13 PM EST To: Alm ORN Anner, MD  Can you please review chart?  Do you still want him on aspirin  and Xarelto ?  History of DES RI in 2018, recurrent PEs on chronic Xarelto .  Otherwise he is doing well.  Seen for 1 year follow-up

## 2024-07-25 NOTE — Telephone Encounter (Signed)
 Patient notified and med list updated.

## 2024-07-26 ENCOUNTER — Encounter (INDEPENDENT_AMBULATORY_CARE_PROVIDER_SITE_OTHER): Payer: Self-pay | Admitting: Vascular Surgery

## 2024-07-26 ENCOUNTER — Ambulatory Visit (INDEPENDENT_AMBULATORY_CARE_PROVIDER_SITE_OTHER): Admitting: Vascular Surgery

## 2024-07-26 VITALS — BP 143/86 | HR 79 | Resp 18 | Ht 72.0 in | Wt 370.0 lb

## 2024-07-26 DIAGNOSIS — I10 Essential (primary) hypertension: Secondary | ICD-10-CM | POA: Diagnosis not present

## 2024-07-26 DIAGNOSIS — R600 Localized edema: Secondary | ICD-10-CM

## 2024-07-27 DIAGNOSIS — M4804 Spinal stenosis, thoracic region: Secondary | ICD-10-CM | POA: Diagnosis not present

## 2024-07-28 ENCOUNTER — Encounter (INDEPENDENT_AMBULATORY_CARE_PROVIDER_SITE_OTHER): Payer: Self-pay | Admitting: Vascular Surgery

## 2024-07-28 NOTE — Progress Notes (Signed)
 Subjective:    Patient ID: Franklin Hicks, male    DOB: 09/23/1950, 73 y.o.   MRN: [id number] Chief Complaint  Patient presents with   Follow-up    1 month follow up     Franklin Hicks a 73 year old male returns to clinic today for 1 month follow-up of his bilateral lower extremities now that he has been in compression socks for 1 month.  Patient endorses he has been wearing them daily.  He endorses he is having less pain to his lower extremities.  He endorses he has dry skin and has been working on this.  He endorses he is doing more walking around today.  He endorses overall he feels better.  He denies any new sores or ulcers to his bilateral lower extremities.    Discussed the use of AI scribe software for clinical note transcription with the patient, who gave verbal consent to proceed.  History of Present Illness            Results           Review of Systems  Constitutional: Negative.   Cardiovascular:  Positive for leg swelling.  Skin:  Positive for color change.  All other systems reviewed and are negative.      Objective:   Physical Exam Vitals reviewed.  Constitutional:      Appearance: Normal appearance. He is obese.  HENT:     Head: Normocephalic and atraumatic.  Eyes:     Pupils: Pupils are equal, round, and reactive to light.  Cardiovascular:     Rate and Rhythm: Normal rate and regular rhythm.     Pulses: Normal pulses.     Heart sounds: Normal heart sounds.  Pulmonary:     Effort: Pulmonary effort is normal.     Breath sounds: Normal breath sounds.  Abdominal:     General: Bowel sounds are normal.     Palpations: Abdomen is soft.  Musculoskeletal:     Right lower leg: Edema present.     Left lower leg: Edema present.  Skin:    General: Skin is warm and dry.     Capillary Refill: Capillary refill takes 2 to 3 seconds.  Neurological:     General: No focal deficit present.     Mental Status: He is alert and oriented to person, place, and time. Mental  status is at baseline.  Psychiatric:        Mood and Affect: Mood normal.        Behavior: Behavior normal.        Thought Content: Thought content normal.        Judgment: Judgment normal.     Physical Exam          BP (!) 143/86 (BP Location: Right Arm, Patient Position: Sitting, Cuff Size: Large)   Pulse 79   Resp 18   Ht 6' (1.829 m)   Wt (!) 370 lb (167.8 kg)   BMI 50.18 kg/m   Past Medical History:  Diagnosis Date   Arthritis    knees (12/17/2017)   CAD S/P percutaneous coronary angioplasty 09/20/2016   Nstemi 08/2016. Dr. Claudene. DES- brilinta  and asa. Requests change to [facility] cardiology; 95% Ramus -> PCI Resolute Onyx DES 2.75 x 18   Central cord syndrome (HCC) 12/17/2017   Chronic combined systolic and diastolic heart failure (HCC) 10/09/2016   EF 45% and grade II diastolic after nstemi   Diet-controlled diabetes mellitus (HCC)    DJD (degenerative joint  disease)    Gout    High cholesterol    Hypertension    NSTEMI (non-ST elevated myocardial infarction) (HCC) 09/20/2016   95% Ramus - > PCI    Obesity    Recurrent pulmonary embolism (HCC) 11/'14; 4/'19   a) Bilateral segmental and subsegmental pulmonary emboli with mild RV Strain.; b) after fall with C-spine Fxr --> Acute PE of right main pulmonary artery extending into multiple segments.    Social History   Socioeconomic History   Marital status: Widowed    Spouse name: Not on file   Number of children: Not on file   Years of education: Not on file   Highest education level: Not on file  Occupational History   Occupation: teacher  Tobacco Use   Smoking status: Former    Current packs/day: 0.00    Average packs/day: 1 pack/day for 13.0 years (13.0 ttl pk-yrs)    Types: Cigarettes    Start date: 07/13/1964    Quit date: 07/13/1977    Years since quitting: 47.0   Smokeless tobacco: Never  Vaping Use   Vaping status: Never Used  Substance and Sexual Activity   Alcohol use: Yes    Alcohol/week: 0.0  standard drinks of alcohol    Comment: holidays only   Drug use: No   Sexual activity: Not on file  Other Topics Concern   Not on file  Social History Narrative   Widowed 02/2020 after over 40 years married.  He has 3 children and 5 grandchildren. They also 5 great grandchildren.       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   Social Drivers of Health   Financial Resource Strain: Low Risk  (09/16/2023)   Overall Financial Resource Strain (CARDIA)    Difficulty of Paying Living Expenses: Not hard at all  Food Insecurity: No Food Insecurity (12/15/2023)   Hunger Vital Sign    Worried About Running Out of Food in the Last Year: Never true    Ran Out of Food in the Last Year: Never true  Transportation Needs: No Transportation Needs (12/15/2023)   PRAPARE - Administrator, Civil Service (Medical): No    Lack of Transportation (Non-Medical): No  Physical Activity: Insufficiently Active (09/16/2023)   Exercise Vital Sign    Days of Exercise per Week: 2 days    Minutes of Exercise per Session: 10 min  Stress: No Stress Concern Present (09/16/2023)   [employer] of Occupational Health - Occupational Stress Questionnaire    Feeling of Stress : Not at all  Social Connections: Moderately Integrated (12/15/2023)   Social Connection and Isolation Panel    Frequency of Communication with Friends and Family: Three times a week    Frequency of Social Gatherings with Friends and Family: Three times a week    Attends Religious Services: More than 4 times per year    Active Member of Clubs or Organizations: Yes    Attends Banker Meetings: 1 to 4 times per year    Marital Status: Widowed  Intimate Partner Violence: Not At Risk (12/15/2023)   Humiliation, Afraid, Rape, and Kick questionnaire    Fear of Current or Ex-Partner: No    Emotionally Abused: No    Physically Abused: No    Sexually Abused: No    Past Surgical History:   Procedure Laterality Date   CARDIAC CATHETERIZATION N/A 09/19/2016   Procedure: Left Heart Cath and Coronary Angiography;  Surgeon: Salena  Claudene, MD;  Location: [facility] INVASIVE CV LAB;  Service: Cardiovascular: 95% proximal Ramus Intermedius --> PCI   CARDIAC CATHETERIZATION N/A 09/19/2016   Procedure: Coronary Stent Intervention;  Surgeon: Lonni Hanson, MD;  Location: [facility] INVASIVE CV LAB;  Service: Cardiovascular: 95% ramus intermedius;  Resolute Onyx 2.75 x 18 mm drug-eluting stent   CYSTOSCOPY/RETROGRADE/URETEROSCOPY/STONE EXTRACTION WITH BASKET  7992,8000   ureteral stone    MENISCUS REPAIR Left    knee open meniscetomy   TRANSTHORACIC ECHOCARDIOGRAM  2004   no lvh nl ejection fraction   TRANSTHORACIC ECHOCARDIOGRAM  09/19/2016   In setting of NSTEMI:  EF 45-50% with diffuse hypokinesis. GR 2 DD. Mild biatrial enlargement.    Family History  Problem Relation Age of Onset   Liver disease Mother     Allergies  Allergen Reactions   Ace Inhibitors     Angioedema   Gabapentin      Dizzy on 300 mg dose    Other Hives and Itching    msg       Latest Ref Rng & Units 05/06/2024    4:06 PM 12/18/2023    3:21 AM 12/17/2023    4:00 AM  CBC  WBC 3.8 - 10.8 Thousand/uL 7.4  6.6  6.8   Hemoglobin 13.2 - 17.1 g/dL 88.9  87.8  88.0   Hematocrit 38.5 - 50.0 % 33.9  38.9  37.8   Platelets 140 - 400 Thousand/uL 278  222  213       CMP     Component Value Date/Time   NA 137 05/06/2024 1606   K 4.5 05/06/2024 1606   CL 104 05/06/2024 1606   CO2 25 05/06/2024 1606   GLUCOSE 131 (H) 05/06/2024 1606   BUN 19 05/06/2024 1606   CREATININE 1.12 05/06/2024 1606   CALCIUM  8.6 05/06/2024 1606   PROT 6.7 05/06/2024 1606   ALBUMIN 3.7 12/01/2023 1528   AST 20 05/06/2024 1606   ALT 19 05/06/2024 1606   ALKPHOS 80 12/01/2023 1528   BILITOT 1.0 05/06/2024 1606   GFR 66.17 12/01/2023 1528   EGFR 69 05/06/2024 1606   GFRNONAA >60 12/18/2023 0321     No results found.     Assessment &  Plan:   1. Bilateral lower extremity edema (Primary) Patient returns today for 1 month follow-up of bilateral lower extremity swelling.  He previously spent multiple months in [facility].  Over the last month we had placed him back in compression socks and this is a follow-up to see how well he is doing.  His bilateral lower extremity swelling is much less today.  He still has skin sloughing.  There are no new ulcers or wounds to his bilateral lower extremities.  He endorses he is walking more now that he is more comfortable to do so.  I encouraged him to continue using conservative therapies such as compression, elevation, rest and exercise.  Patient agrees to follow-up in 6 months with no studies for further evaluation of his bilateral lower extremity lymphedema/swelling  2. Essential hypertension Continue antihypertensive medications as already ordered, these medications have been reviewed and there are no changes at this time.  3. Morbid obesity (HCC) We have had multiple conversations about the patient losing weight.  He acknowledges and is trying to do so as planned.  He verbalizes and understands his weight contributes to the swelling of his legs.                 Current Outpatient Medications  on File Prior to Visit  Medication Sig Dispense Refill   acetaminophen  (TYLENOL ) 650 MG CR tablet Take 650-1,300 mg by mouth every 8 (eight) hours as needed for pain.     ammonium lactate  (AMLACTIN) 12 % lotion Apply 1 application topically as needed for dry skin. 400 g 0   atorvastatin  (LIPITOR ) 80 MG tablet TAKE 1 TABLET BY MOUTH DAILY AT 6 PM. 90 tablet 3   baclofen  (LIORESAL ) 10 MG tablet Take 1 tablet (10 mg total) by mouth 3 (three) times daily. 90 tablet 3   Chromium  Picolinate (CHROMIUM  PICOLATE PO) Take 1 tablet by mouth 3 (three) times a week. Taking periodically     clotrimazole -betamethasone  (LOTRISONE ) cream Apply to both lower extremities twice daily. (Patient taking  differently: Apply to both lower extremities twice daily.PRN) 45 g 3   fluticasone  (FLONASE ) 50 MCG/ACT nasal spray SPRAY 2 SPRAYS INTO EACH NOSTRIL EVERY DAY 48 mL 1   gabapentin  (NEURONTIN ) 100 MG capsule TAKE 1 CAPSULE (100 MG TOTAL) BY MOUTH THREE TIMES DAILY. 90 capsule 1   methocarbamol  (ROBAXIN ) 500 MG tablet Take 1 tablet (500 mg total) by mouth every 8 (eight) hours as needed for muscle spasms. 60 tablet 2   metoprolol  tartrate (LOPRESSOR ) 50 MG tablet TAKE 1 TABLET BY MOUTH TWICE A DAY 60 tablet 0   Multiple Vitamin (MULTIVITAMIN WITH MINERALS) TABS tablet Take 1 tablet by mouth daily.     oxyCODONE  (OXY IR/ROXICODONE ) 5 MG immediate release tablet Take 1 tablet (5 mg total) by mouth every 6 (six) hours as needed for moderate pain (pain score 4-6) or breakthrough pain. 30 tablet 0   rivaroxaban  (XARELTO ) 20 MG TABS tablet Take 1 tablet (20 mg total) by mouth daily. 90 tablet 3   tacrolimus  (PROTOPIC ) 0.1 % ointment Apply to legs twice a day 100 g 2   terazosin  (HYTRIN ) 10 MG capsule TAKE 1 CAPSULE BY MOUTH EVERYDAY AT BEDTIME 90 capsule 3   vitamin C (ASCORBIC ACID) 500 MG tablet Take 500 mg by mouth daily in the afternoon.     Dietary Management Product (VASCULERA) TABS Take 1 tablet once a day (Patient not taking: Reported on 07/26/2024) 30 tablet 5   tirzepatide  (MOUNJARO ) 2.5 MG/0.5ML Pen Inject 2.5 mg into the skin once a week. (Patient not taking: Reported on 07/26/2024) 2 mL 1   No current facility-administered medications on file prior to visit.    There are no Patient Instructions on file for this visit. No follow-ups on file.   Gwendlyn JONELLE Shank, NP

## 2024-08-03 ENCOUNTER — Other Ambulatory Visit: Payer: Self-pay | Admitting: Cardiology

## 2024-08-03 DIAGNOSIS — I152 Hypertension secondary to endocrine disorders: Secondary | ICD-10-CM

## 2024-08-03 DIAGNOSIS — I2510 Atherosclerotic heart disease of native coronary artery without angina pectoris: Secondary | ICD-10-CM

## 2024-08-03 DIAGNOSIS — M4804 Spinal stenosis, thoracic region: Secondary | ICD-10-CM | POA: Diagnosis not present

## 2024-08-03 DIAGNOSIS — I5042 Chronic combined systolic (congestive) and diastolic (congestive) heart failure: Secondary | ICD-10-CM

## 2024-08-07 ENCOUNTER — Other Ambulatory Visit: Payer: Self-pay | Admitting: Sports Medicine

## 2024-08-08 ENCOUNTER — Other Ambulatory Visit: Payer: Self-pay

## 2024-08-08 ENCOUNTER — Encounter: Payer: Self-pay | Admitting: Family Medicine

## 2024-08-08 ENCOUNTER — Telehealth: Payer: Self-pay

## 2024-08-08 MED ORDER — GABAPENTIN 100 MG PO CAPS: 100.0000 mg | ORAL_CAPSULE | Freq: Every day | ORAL | 1 refills | Status: AC

## 2024-08-09 NOTE — Telephone Encounter (Signed)
 error

## 2024-08-14 ENCOUNTER — Other Ambulatory Visit: Payer: Self-pay | Admitting: Family Medicine

## 2024-08-16 ENCOUNTER — Encounter: Payer: Self-pay | Admitting: Family Medicine

## 2024-08-17 NOTE — Telephone Encounter (Signed)
 Please review and advise.

## 2024-08-24 ENCOUNTER — Ambulatory Visit: Admitting: Cardiology

## 2024-09-06 ENCOUNTER — Ambulatory Visit: Admitting: Family Medicine

## 2024-09-14 ENCOUNTER — Encounter: Attending: Physical Medicine & Rehabilitation | Admitting: Physical Medicine & Rehabilitation

## 2024-09-14 ENCOUNTER — Encounter: Payer: Self-pay | Admitting: Physical Medicine & Rehabilitation

## 2024-09-14 VITALS — BP 131/73 | HR 68 | Ht 72.0 in | Wt 370.0 lb

## 2024-09-14 DIAGNOSIS — M5136 Other intervertebral disc degeneration, lumbar region with discogenic back pain only: Secondary | ICD-10-CM | POA: Insufficient documentation

## 2024-09-14 DIAGNOSIS — S14104S Unspecified injury at C4 level of cervical spinal cord, sequela: Secondary | ICD-10-CM | POA: Diagnosis not present

## 2024-09-14 MED ORDER — BACLOFEN 20 MG PO TABS: 20.0000 mg | ORAL_TABLET | Freq: Three times a day (TID) | ORAL | 4 refills | Status: AC

## 2024-09-14 NOTE — Patient Instructions (Addendum)
" °  VISIT SUMMARY: During your visit, we discussed your chronic low back pain, muscle spasms, and the impact of your C4 spinal cord injury. We also addressed your concerns about obesity and its effect on your pain and mobility. We reviewed your current medications and physical therapy progress, and we developed a plan to manage your symptoms at home.  YOUR PLAN: -CHRONIC LOW BACK PAIN WITH LUMBAR SPONDYLOSIS AND ANKYLOSIS: Chronic low back pain due to lumbar spondylosis and ankylosis involves degeneration and stiffness in the spine. We will coordinate with your therapy team to create a home exercise and pain management plan after your formal therapy ends. You may use an S-hook or similar device for self-massage at home. You can also try a TENS unit for pain control, with your family's help for electrode placement. Please discuss TENS unit with therapy. Continue your current medications, and we may consider increasing your baclofen  dosage if tolerated. Avoid NSAIDs due to your anticoagulation therapy. Keep stretching, maintain proper posture, and increase movement as tolerated.  BACLOFEN   DAYS 1-5: 10-10-20MG  DAYS 6-10 20-10-20MG  DAYS 11+ 20MG  THREE X DAILY  -FUNCTIONAL DEFICITS SECONDARY TO C4 SPINAL CORD INJURY: Your C4 spinal cord injury causes chronic functional deficits, contributing to pain and muscle spasms. Continue your current physical therapy program as long as you are making progress. We will work with your therapy team to find ways to maintain your progress and manage symptoms at home after therapy ends. We will also try a TENS unit for symptomatic relief, with your family's help for placement. We may consider increasing your baclofen  dosage for spasticity, as tolerated.  -MORBID OBESITY: Morbid obesity is significantly impacting your mobility, pain, and overall function. It is important to focus on weight loss to improve your mobility, pain, and overall health. We encourage you to consult  with a dietitian or weight loss specialist.  INSTRUCTIONS: We will order a TENS unit for home use. Please continue your current medications and physical therapy program. Consider increasing your baclofen  dosage if tolerated. Avoid NSAIDs due to your anticoagulation therapy. Consult with a dietitian or weight loss specialist for help with weight loss.    Contains text generated by [employer]bridge.   "

## 2024-09-14 NOTE — Progress Notes (Signed)
 "  Subjective:    Patient ID: Franklin Hicks, male    DOB: [birth year], 74 y.o.   MRN: [id number]  HPI  Discussed the use of AI scribe software for clinical note transcription with the patient, who gave verbal consent to proceed.  History of Present Illness Franklin Hicks is a 74 year old male with chronic low back pain and C4 spinal cord injury who presents for follow-up of pain management and spasticity.  Low Back and Oblique Pain: - Persistent bilateral aching and tightness in the lumbar and oblique regions - Most severe in the morning, improves with medication and movement - Exacerbated by prolonged sitting and attempts to straighten after bending - No pain outside the lumbar and oblique regions - Concerned about chronicity and anticipates ongoing management needs  Muscle Spasms and Cramping: - Intermittent cramping and muscle spasms, occasionally triggered by deep coughing or sneezing - No associated numbness - Muscle spasms have improved over the past few weeks, with no recent episodes - Baclofen  provides partial relief for spasticity and is well tolerated - Methocarbamol  used as needed - Gabapentin  continued at a stable dose due to prior intolerance of higher doses - Oxycodone  no longer used, though available as needed - Anti-inflammatory medications contraindicated due to ongoing anticoagulation with Xarelto   Functional Status and Rehabilitation: - Actively engaged in physical therapy, including stretching, dry needling, and driving activities - Physical therapy has provided significant pain relief - Apprehensive about losing progress after formal therapy concludes - Interested in additional home-based pain control strategies  Obesity and Mechanical Strain: - Morbid obesity with concern for impact on mobility and pain - Excess weight perceived to contribute to mechanical strain and muscle spasms, particularly in the oblique regions  Neurological and Bowel Function: - No new  neurological deficits - Bowel function is normal   Pain Inventory Average Pain 3 Pain Right Now 3 My pain is constant and aching  In the last 24 hours, has pain interfered with the following? General activity 10 Relation with others 10 Enjoyment of life 10 What TIME of day is your pain at its worst? morning , daytime, evening, and night Sleep (in general) Good  Pain is worse with: spasms Pain improves with: therapy/exercise and   Relief from Meds: 5-8  Family History  Problem Relation Age of Onset   Liver disease Mother    Social History   Socioeconomic History   Marital status: Widowed    Spouse name: Not on file   Number of children: Not on file   Years of education: Not on file   Highest education level: Not on file  Occupational History   Occupation: teacher  Tobacco Use   Smoking status: Former    Current packs/day: 0.00    Average packs/day: 1 pack/day for 13.0 years (13.0 ttl pk-yrs)    Types: Cigarettes    Start date: 07/13/1964    Quit date: 07/13/1977    Years since quitting: 47.2   Smokeless tobacco: Never  Vaping Use   Vaping status: Never Used  Substance and Sexual Activity   Alcohol use: Yes    Alcohol/week: 0.0 standard drinks of alcohol    Comment: holidays only   Drug use: No   Sexual activity: Not on file  Other Topics Concern   Not on file  Social History Narrative   Widowed 02/2020 after over 40 years married.  He has 3 children and 5 grandchildren. They also 5 great grandchildren.  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   Social Drivers of Health   Tobacco Use: Medium Risk (09/14/2024)   Patient History    Smoking Tobacco Use: Former    Smokeless Tobacco Use: Never    Passive Exposure: Not on file  Financial Resource Strain: Low Risk (09/16/2023)   Overall Financial Resource Strain (CARDIA)    Difficulty of Paying Living Expenses: Not hard at all  Food Insecurity: No Food Insecurity  (12/15/2023)   Hunger Vital Sign    Worried About Running Out of Food in the Last Year: Never true    Ran Out of Food in the Last Year: Never true  Transportation Needs: No Transportation Needs (12/15/2023)   PRAPARE - Administrator, Civil Service (Medical): No    Lack of Transportation (Non-Medical): No  Physical Activity: Insufficiently Active (09/16/2023)   Exercise Vital Sign    Days of Exercise per Week: 2 days    Minutes of Exercise per Session: 10 min  Stress: No Stress Concern Present (09/16/2023)   [employer] of Occupational Health - Occupational Stress Questionnaire    Feeling of Stress : Not at all  Social Connections: Moderately Integrated (12/15/2023)   Social Connection and Isolation Panel    Frequency of Communication with Friends and Family: Three times a week    Frequency of Social Gatherings with Friends and Family: Three times a week    Attends Religious Services: More than 4 times per year    Active Member of Clubs or Organizations: Yes    Attends Banker Meetings: 1 to 4 times per year    Marital Status: Widowed  Depression (PHQ2-9): Low Risk (06/29/2024)   Depression (PHQ2-9)    PHQ-2 Score: 0  Alcohol Screen: Not on file  Housing: Low Risk (12/15/2023)   Housing Stability Vital Sign    Unable to Pay for Housing in the Last Year: No    Number of Times Moved in the Last Year: 0    Homeless in the Last Year: No  Utilities: Not At Risk (12/15/2023)   AHC Utilities    Threatened with loss of utilities: No  Health Literacy: Adequate Health Literacy (09/16/2023)   B1300 Health Literacy    Frequency of need for help with medical instructions: Never   Past Surgical History:  Procedure Laterality Date   CARDIAC CATHETERIZATION N/A 09/19/2016   Procedure: Left Heart Cath and Coronary Angiography;  Surgeon: Salena Negri, MD;  Location: [facility];  Service: Cardiovascular: 95% proximal Ramus Intermedius --> PCI   CARDIAC  CATHETERIZATION N/A 09/19/2016   Procedure: Coronary Stent Intervention;  Surgeon: Lonni Hanson, MD;  Location: [facility];  Service: Cardiovascular: 95% ramus intermedius;  Resolute Onyx 2.75 x 18 mm drug-eluting stent   CYSTOSCOPY/RETROGRADE/URETEROSCOPY/STONE EXTRACTION WITH BASKET  7992,8000   ureteral stone    MENISCUS REPAIR Left    knee open meniscetomy   TRANSTHORACIC ECHOCARDIOGRAM  2004   no lvh nl ejection fraction   TRANSTHORACIC ECHOCARDIOGRAM  09/19/2016   In setting of NSTEMI:  EF 45-50% with diffuse hypokinesis. GR 2 DD. Mild biatrial enlargement.   Past Surgical History:  Procedure Laterality Date   CARDIAC CATHETERIZATION N/A 09/19/2016   Procedure: Left Heart Cath and Coronary Angiography;  Surgeon: Salena Negri, MD;  Location: [facility];  Service: Cardiovascular: 95% proximal Ramus Intermedius --> PCI   CARDIAC CATHETERIZATION N/A 09/19/2016   Procedure: Coronary Stent Intervention;  Surgeon:  Lonni Hanson, MD;  Location: [facility];  Service: Cardiovascular: 95% ramus intermedius;  Resolute Onyx 2.75 x 18 mm drug-eluting stent   CYSTOSCOPY/RETROGRADE/URETEROSCOPY/STONE EXTRACTION WITH BASKET  7992,8000   ureteral stone    MENISCUS REPAIR Left    knee open meniscetomy   TRANSTHORACIC ECHOCARDIOGRAM  2004   no lvh nl ejection fraction   TRANSTHORACIC ECHOCARDIOGRAM  09/19/2016   In setting of NSTEMI:  EF 45-50% with diffuse hypokinesis. GR 2 DD. Mild biatrial enlargement.   Past Medical History:  Diagnosis Date   Arthritis    knees (12/17/2017)   CAD S/P percutaneous coronary angioplasty 09/20/2016   Nstemi 08/2016. Dr. Claudene. DES- brilinta  and asa. Requests change to [facility] cardiology; 95% Ramus -> PCI Resolute Onyx DES 2.75 x 18   Central cord syndrome (HCC) 12/17/2017   Chronic combined systolic and diastolic heart failure (HCC) 10/09/2016   EF 45% and grade II diastolic after nstemi   Diet-controlled diabetes mellitus (HCC)    DJD  (degenerative joint disease)    Gout    High cholesterol    Hypertension    NSTEMI (non-ST elevated myocardial infarction) (HCC) 09/20/2016   95% Ramus - > PCI    Obesity    Recurrent pulmonary embolism (HCC) 11/'14; 4/'19   a) Bilateral segmental and subsegmental pulmonary emboli with mild RV Strain.; b) after fall with C-spine Fxr --> Acute PE of right main pulmonary artery extending into multiple segments.   BP 131/73 (BP Location: Left Arm, Patient Position: Sitting, Cuff Size: Large)   Pulse 68   Ht 6' (1.829 m)   Wt (!) 370 lb (167.8 kg)   SpO2 93%   BMI 50.18 kg/m   Opioid Risk Score:   Fall Risk Score:  `1  Depression screen PHQ 2/9     06/29/2024    2:15 PM 05/06/2024    3:11 PM 09/16/2023    2:33 PM 07/17/2023   11:18 AM 07/09/2022    3:19 PM 07/07/2022   10:10 AM 07/01/2022    3:14 PM  Depression screen PHQ 2/9  Decreased Interest 0 0 0 0 0 0 0  Down, Depressed, Hopeless 0 0 0 0 0 0 0  PHQ - 2 Score 0 0 0 0 0 0 0  Altered sleeping 0 0     0  Tired, decreased energy 0 0     0  Change in appetite 0 0     0  Feeling bad or failure about yourself  0 0     0  Trouble concentrating 0 0     0  Moving slowly or fidgety/restless 0 0     0  Suicidal thoughts 0 0     0  PHQ-9 Score 0  0      0   Difficult doing work/chores  Not difficult at all     Not difficult at all     Data saved with a previous flowsheet row definition     Review of Systems  Musculoskeletal:  Positive for back pain.       Bilateral pain on sides and mid to low back pain  All other systems reviewed and are negative.      Objective:   Physical Exam Gen: no distress, normal appearing, MORBIDLY OBESE HEENT: oral mucosa pink and moist, NCAT Cardio: Reg rate Chest: normal effort, normal rate of breathing Abd: soft, non-distended Ext: no edema Psych: pleasant, normal affect Skin: intact Neuro: Alert and oriented x  3. Normal insight and awareness. Intact Memory. Normal language and speech.  Cranial nerve exam unremarkable. MMT: Patient's motor scores are 5 out of 5 in both upper extremities proximal and distal.  Lower extremities he has 4 out of 5 hip flexors knee extensors and 5 out of 5 distally.  Reflexes are brisk especially in the lower extremities.  Sensory exam appears to be intact to light touch, proprioception, and pain in all fours..   Musculoskeletal: Patient has pain with palpation over the mid to lower lumbar spine.  There is palpable muscle spasm of the paraspinals particularly on the left side.  He does not have discomfort with palpation over the flanks and abdomen.  I did not appreciate any muscle spasm there.           Assessment & Plan:  1. Functional Deficits Secondary to C4 Spinal Cord Injury:            Continue with outpt  Physical Therapy.            -needs HEP              2. Neuropathic pain in feet?: Continue Gabapentin   3. Bilateral OA of Bilateral Knees : Continue  Voltaren  Gel            -weight loss was discussed 4. Morbid Obesity: Continue with Healthy Diet Regimen--has talked to PCP 5.  Chronic low back pain with ankylosis and spondylosis on most recent MRI from April.  Symptoms are consistent with spasm as well as some referred pain from his back area.  There are no signs of consistent radiculopathy.  Could be an element of his spinal cord injury playing a role here especially with spasticity although he is not spastic elsewhere  -current flank pain may be spasm or referred pain from back            - Continue with outpatient therapies working on modalities including dry needling.  Again we discussed the need for a HEP, and a home strategy for pain control            -  increase baclofen  to 20mg  tid after titration. Hold for sedation            - Gabapentin  100 mg 3 times daily for now.  He did not tolerate more than 300 mg in the past. 6.  Lower extremity edema, continue per vascular surgery recommendations            - Compression/elevation 7.   Bowels and bladder            - Both appear to be functioning fairly regularly at this point.       45 minutes of face to face patient care time were spent during this visit. All questions were encouraged and answered. Follow up with me in 3 months.       "

## 2024-09-15 ENCOUNTER — Ambulatory Visit: Admitting: Podiatry

## 2024-09-15 DIAGNOSIS — B351 Tinea unguium: Secondary | ICD-10-CM | POA: Diagnosis not present

## 2024-09-15 DIAGNOSIS — E1142 Type 2 diabetes mellitus with diabetic polyneuropathy: Secondary | ICD-10-CM | POA: Diagnosis not present

## 2024-09-15 DIAGNOSIS — M79674 Pain in right toe(s): Secondary | ICD-10-CM

## 2024-09-15 DIAGNOSIS — M79675 Pain in left toe(s): Secondary | ICD-10-CM | POA: Diagnosis not present

## 2024-09-15 NOTE — Progress Notes (Signed)
"  °  Subjective:  Patient ID: Franklin Hicks, male    DOB: 1951/06/04,  MRN: [id number]  Merland Holness Craighead presents to clinic today for at risk foot care with history of diabetic neuropathy and painful thick toenails that are difficult to trim. Pain interferes with ambulation. Aggravating factors include wearing enclosed shoe gear. Pain is relieved with periodic professional debridement. Patient has completed compression wrap therapy for his lymphedema and has now graduated to compression hose daily. He continues topical medications prescribed by Dermatology for LE skin plaques. Chief Complaint  Patient presents with   Nail Problem    Thick painful toenails, 3 month follow up    New problem(s): None.   PCP is Katrinka Garnette KIDD, MD. [place] 05/06/24.  Allergies[1]  Review of Systems: Negative except as noted in the HPI.  Objective: No changes noted in today's physical examination. There were no vitals filed for this visit. CLAUDIO MONDRY is a pleasant 74 y.o. male morbidly obese in NAD. AAO x 3.  Vascular Examination: CFT <3 seconds b/l LE. Palpable DP pulse(s) b/l LE. Palpable PT pulse(s) b/l LE. Pedal hair absent. No pain with calf compression b/l. Lower extremity skin temperature gradient within normal limits. Wearing  below knee compression hose b/l.   Evidence of skin changes consistent with long term venous stasis BLE. No ischemia or gangrene noted b/l LE. No cyanosis or clubbing noted b/l LE.  Neurological Examination: Sensation grossly intact b/l with 10 gram monofilament. Vibratory sensation intact b/l. Pt has subjective symptoms of neuropathy.  Dermatological Examination: No open wounds. No interdigital macerations.   Toenails 1-5 b/l thick, discolored, elongated with subungual debris and pain on dorsal palpation.   No hyperkeratotic nor porokeratotic lesions.       He continues to have improvement in plaque appearance/texture.   Musculoskeletal Examination: Muscle strength 5/5 to all  lower extremity muscle groups bilaterally. No pain, crepitus or joint limitation noted with ROM bilateral LE. Pes planus deformity noted bilateral LE. Utilizes wheelchair for mobility assistance.  Radiographs: None  Assessment/Plan: 1. Pain due to onychomycosis of toenails of both feet   2. Diabetic peripheral neuropathy associated with type 2 diabetes mellitus (HCC)   Patient was evaluated and treated. All patient's and/or POA's questions/concerns addressed on today's visit. Toenails 1-5 b/l debrided in length and girth without incident. Continue foot and shoe inspections daily. Monitor blood glucose per PCP/Endocrinologist's recommendations. Continue soft, supportive shoe gear daily. Report any pedal injuries to medical professional. Call office if there are any questions/concerns. -Patient/POA to call should there be question/concern in the interim.   Return in about 3 months (around 12/14/2024).  Delon LITTIE Merlin, DPM      [place] LOCATION: [address], [place] [address]                   Office [phone]   [place] LOCATION: [address] [place], [place] [address] Office [phone]      [1]  Allergies Allergen Reactions   Ace Inhibitors     Angioedema   Gabapentin      Dizzy on 300 mg dose    Other Hives and Itching    msg   "

## 2024-09-20 ENCOUNTER — Ambulatory Visit: Payer: Medicare PPO

## 2024-09-20 ENCOUNTER — Encounter: Payer: Self-pay | Admitting: Podiatry

## 2024-09-24 ENCOUNTER — Other Ambulatory Visit: Payer: Self-pay | Admitting: Physical Medicine & Rehabilitation

## 2024-10-04 ENCOUNTER — Ambulatory Visit: Admitting: Family Medicine

## 2024-11-16 ENCOUNTER — Encounter: Admitting: Physical Medicine & Rehabilitation

## 2024-11-23 ENCOUNTER — Ambulatory Visit: Admitting: Dermatology

## 2024-12-15 ENCOUNTER — Ambulatory Visit: Admitting: Podiatry

## 2025-01-24 ENCOUNTER — Ambulatory Visit (INDEPENDENT_AMBULATORY_CARE_PROVIDER_SITE_OTHER): Admitting: Vascular Surgery
